# Patient Record
Sex: Female | Born: 1945 | Race: White | Hispanic: No | Marital: Married | Smoking: Former smoker
Health system: Southern US, Community
[De-identification: ages and names within clinical notes are randomized; demographics above are authoritative.]

## PROBLEM LIST (undated history)

## (undated) DIAGNOSIS — M48061 Spinal stenosis, lumbar region without neurogenic claudication: Secondary | ICD-10-CM

## (undated) DIAGNOSIS — F32A Depression, unspecified: Secondary | ICD-10-CM

## (undated) DIAGNOSIS — E079 Disorder of thyroid, unspecified: Secondary | ICD-10-CM

## (undated) DIAGNOSIS — K5790 Diverticulosis of intestine, part unspecified, without perforation or abscess without bleeding: Secondary | ICD-10-CM

## (undated) DIAGNOSIS — F3181 Bipolar II disorder: Secondary | ICD-10-CM

## (undated) DIAGNOSIS — F329 Major depressive disorder, single episode, unspecified: Secondary | ICD-10-CM

## (undated) DIAGNOSIS — I10 Essential (primary) hypertension: Secondary | ICD-10-CM

## (undated) DIAGNOSIS — M797 Fibromyalgia: Secondary | ICD-10-CM

## (undated) DIAGNOSIS — M4316 Spondylolisthesis, lumbar region: Secondary | ICD-10-CM

## (undated) DIAGNOSIS — M545 Low back pain, unspecified: Secondary | ICD-10-CM

## (undated) DIAGNOSIS — J449 Chronic obstructive pulmonary disease, unspecified: Secondary | ICD-10-CM

## (undated) DIAGNOSIS — E785 Hyperlipidemia, unspecified: Secondary | ICD-10-CM

## (undated) DIAGNOSIS — F419 Anxiety disorder, unspecified: Secondary | ICD-10-CM

## (undated) DIAGNOSIS — M1990 Unspecified osteoarthritis, unspecified site: Secondary | ICD-10-CM

## (undated) DIAGNOSIS — I5189 Other ill-defined heart diseases: Secondary | ICD-10-CM

## (undated) HISTORY — DX: Major depressive disorder, single episode, unspecified: F32.9

## (undated) HISTORY — DX: Disorder of thyroid, unspecified: E07.9

## (undated) HISTORY — PX: TONSILLECTOMY: SUR1361

## (undated) HISTORY — PX: EYE SURGERY: SHX253

## (undated) HISTORY — PX: OTHER SURGICAL HISTORY: SHX169

## (undated) HISTORY — PX: APPENDECTOMY: SHX54

## (undated) HISTORY — DX: Hyperlipidemia, unspecified: E78.5

## (undated) HISTORY — DX: Other ill-defined heart diseases: I51.89

## (undated) HISTORY — DX: Essential (primary) hypertension: I10

## (undated) HISTORY — DX: Spondylolisthesis, lumbar region: M43.16

## (undated) HISTORY — DX: Low back pain, unspecified: M54.50

## (undated) HISTORY — PX: TOTAL ABDOMINAL HYSTERECTOMY: SHX209

## (undated) HISTORY — DX: Bipolar II disorder: F31.81

## (undated) HISTORY — DX: Spinal stenosis, lumbar region without neurogenic claudication: M48.061

## (undated) HISTORY — DX: Depression, unspecified: F32.A

## (undated) HISTORY — DX: Low back pain: M54.5

## (undated) HISTORY — DX: Unspecified osteoarthritis, unspecified site: M19.90

## (undated) HISTORY — DX: Fibromyalgia: M79.7

## (undated) HISTORY — DX: Anxiety disorder, unspecified: F41.9

## (undated) HISTORY — DX: Chronic obstructive pulmonary disease, unspecified: J44.9

## (undated) HISTORY — DX: Diverticulosis of intestine, part unspecified, without perforation or abscess without bleeding: K57.90

---

## 1999-12-26 ENCOUNTER — Encounter: Payer: Self-pay | Admitting: Family Medicine

## 1999-12-26 ENCOUNTER — Encounter: Admission: RE | Admit: 1999-12-26 | Discharge: 1999-12-26 | Payer: Self-pay | Admitting: Family Medicine

## 2000-03-13 ENCOUNTER — Encounter: Admission: RE | Admit: 2000-03-13 | Discharge: 2000-03-13 | Payer: Self-pay | Admitting: Thoracic Surgery

## 2000-03-13 ENCOUNTER — Encounter: Payer: Self-pay | Admitting: Thoracic Surgery

## 2000-03-18 ENCOUNTER — Encounter: Admission: RE | Admit: 2000-03-18 | Discharge: 2000-03-18 | Payer: Self-pay | Admitting: Thoracic Surgery

## 2000-03-18 ENCOUNTER — Encounter: Payer: Self-pay | Admitting: Thoracic Surgery

## 2001-08-08 ENCOUNTER — Encounter: Payer: Self-pay | Admitting: Family Medicine

## 2001-08-08 ENCOUNTER — Encounter: Admission: RE | Admit: 2001-08-08 | Discharge: 2001-08-08 | Payer: Self-pay | Admitting: Family Medicine

## 2001-08-12 ENCOUNTER — Encounter: Admission: RE | Admit: 2001-08-12 | Discharge: 2001-08-12 | Payer: Self-pay

## 2003-03-27 ENCOUNTER — Encounter: Payer: Self-pay | Admitting: Family Medicine

## 2003-03-27 ENCOUNTER — Ambulatory Visit (HOSPITAL_COMMUNITY): Admission: RE | Admit: 2003-03-27 | Discharge: 2003-03-27 | Payer: Self-pay | Admitting: Family Medicine

## 2003-11-04 ENCOUNTER — Encounter: Admission: RE | Admit: 2003-11-04 | Discharge: 2003-11-04 | Payer: Self-pay | Admitting: Family Medicine

## 2004-08-24 ENCOUNTER — Emergency Department (HOSPITAL_COMMUNITY): Admission: EM | Admit: 2004-08-24 | Discharge: 2004-08-24 | Payer: Self-pay | Admitting: Emergency Medicine

## 2006-02-19 ENCOUNTER — Encounter: Admission: RE | Admit: 2006-02-19 | Discharge: 2006-02-19 | Payer: Self-pay | Admitting: Family Medicine

## 2006-07-31 ENCOUNTER — Ambulatory Visit: Payer: Self-pay | Admitting: Family Medicine

## 2006-08-28 ENCOUNTER — Ambulatory Visit: Payer: Self-pay | Admitting: Family Medicine

## 2006-11-12 HISTORY — PX: KNEE ARTHROSCOPY: SUR90

## 2007-01-27 ENCOUNTER — Ambulatory Visit: Payer: Self-pay | Admitting: Internal Medicine

## 2007-01-28 ENCOUNTER — Encounter: Payer: Self-pay | Admitting: Internal Medicine

## 2007-01-28 LAB — CONVERTED CEMR LAB
Bilirubin Urine: NEGATIVE
Ketones, ur: NEGATIVE mg/dL
Leukocytes, UA: NEGATIVE
Protein, ur: 30 mg/dL — AB
Specific Gravity, Urine: 1.027 (ref 1.005–1.03)
Urine Glucose: NEGATIVE mg/dL
pH: 8 (ref 5.0–8.0)

## 2007-01-29 ENCOUNTER — Encounter: Payer: Self-pay | Admitting: Internal Medicine

## 2007-02-21 ENCOUNTER — Encounter: Admission: RE | Admit: 2007-02-21 | Discharge: 2007-02-21 | Payer: Self-pay | Admitting: Family Medicine

## 2007-02-21 ENCOUNTER — Ambulatory Visit: Payer: Self-pay | Admitting: Family Medicine

## 2007-04-23 DIAGNOSIS — I10 Essential (primary) hypertension: Secondary | ICD-10-CM

## 2007-04-23 DIAGNOSIS — N719 Inflammatory disease of uterus, unspecified: Secondary | ICD-10-CM | POA: Insufficient documentation

## 2007-04-23 HISTORY — DX: Essential (primary) hypertension: I10

## 2007-04-23 HISTORY — DX: Inflammatory disease of uterus, unspecified: N71.9

## 2007-06-19 ENCOUNTER — Telehealth (INDEPENDENT_AMBULATORY_CARE_PROVIDER_SITE_OTHER): Payer: Self-pay | Admitting: *Deleted

## 2007-06-20 ENCOUNTER — Ambulatory Visit: Payer: Self-pay | Admitting: Family Medicine

## 2007-06-20 DIAGNOSIS — G8929 Other chronic pain: Secondary | ICD-10-CM

## 2007-06-20 DIAGNOSIS — M542 Cervicalgia: Secondary | ICD-10-CM | POA: Insufficient documentation

## 2007-06-20 DIAGNOSIS — M545 Low back pain, unspecified: Secondary | ICD-10-CM | POA: Insufficient documentation

## 2007-06-20 HISTORY — DX: Low back pain, unspecified: M54.50

## 2007-06-20 HISTORY — DX: Other chronic pain: G89.29

## 2007-06-20 HISTORY — DX: Cervicalgia: M54.2

## 2007-06-23 ENCOUNTER — Telehealth (INDEPENDENT_AMBULATORY_CARE_PROVIDER_SITE_OTHER): Payer: Self-pay | Admitting: *Deleted

## 2007-06-24 ENCOUNTER — Ambulatory Visit: Payer: Self-pay | Admitting: Family Medicine

## 2007-06-24 LAB — CONVERTED CEMR LAB
Calcium: 9.6 mg/dL (ref 8.4–10.5)
GFR calc Af Amer: 54 mL/min
Sodium: 143 meq/L (ref 135–145)

## 2007-06-25 ENCOUNTER — Ambulatory Visit: Payer: Self-pay | Admitting: Internal Medicine

## 2007-06-26 ENCOUNTER — Encounter (INDEPENDENT_AMBULATORY_CARE_PROVIDER_SITE_OTHER): Payer: Self-pay | Admitting: *Deleted

## 2007-07-01 ENCOUNTER — Telehealth (INDEPENDENT_AMBULATORY_CARE_PROVIDER_SITE_OTHER): Payer: Self-pay | Admitting: Family Medicine

## 2007-08-22 ENCOUNTER — Ambulatory Visit: Payer: Self-pay | Admitting: Family Medicine

## 2007-08-22 ENCOUNTER — Telehealth (INDEPENDENT_AMBULATORY_CARE_PROVIDER_SITE_OTHER): Payer: Self-pay | Admitting: *Deleted

## 2007-08-24 LAB — CONVERTED CEMR LAB
BUN: 21 mg/dL (ref 6–23)
CO2: 29 meq/L (ref 19–32)
Calcium: 9.3 mg/dL (ref 8.4–10.5)
GFR calc Af Amer: 109 mL/min
Potassium: 3.4 meq/L — ABNORMAL LOW (ref 3.5–5.1)
Sodium: 139 meq/L (ref 135–145)

## 2007-08-25 ENCOUNTER — Telehealth (INDEPENDENT_AMBULATORY_CARE_PROVIDER_SITE_OTHER): Payer: Self-pay | Admitting: *Deleted

## 2007-08-25 ENCOUNTER — Ambulatory Visit: Payer: Self-pay | Admitting: Family Medicine

## 2007-10-13 ENCOUNTER — Telehealth (INDEPENDENT_AMBULATORY_CARE_PROVIDER_SITE_OTHER): Payer: Self-pay | Admitting: *Deleted

## 2007-12-04 ENCOUNTER — Telehealth (INDEPENDENT_AMBULATORY_CARE_PROVIDER_SITE_OTHER): Payer: Self-pay | Admitting: *Deleted

## 2007-12-05 ENCOUNTER — Ambulatory Visit: Payer: Self-pay | Admitting: Family Medicine

## 2007-12-05 LAB — CONVERTED CEMR LAB
CO2: 31 meq/L (ref 19–32)
Calcium: 9.6 mg/dL (ref 8.4–10.5)
Chloride: 102 meq/L (ref 96–112)
Sodium: 141 meq/L (ref 135–145)

## 2007-12-08 ENCOUNTER — Encounter (INDEPENDENT_AMBULATORY_CARE_PROVIDER_SITE_OTHER): Payer: Self-pay | Admitting: *Deleted

## 2007-12-19 ENCOUNTER — Ambulatory Visit: Payer: Self-pay | Admitting: Family Medicine

## 2008-04-12 ENCOUNTER — Ambulatory Visit: Payer: Self-pay | Admitting: Internal Medicine

## 2008-04-12 LAB — CONVERTED CEMR LAB
Blood in Urine, dipstick: NEGATIVE
Glucose, Urine, Semiquant: NEGATIVE
Ketones, urine, test strip: NEGATIVE
Nitrite: NEGATIVE
Protein, U semiquant: NEGATIVE
Urobilinogen, UA: NEGATIVE
pH: 5

## 2008-08-24 ENCOUNTER — Ambulatory Visit: Payer: Self-pay | Admitting: Family Medicine

## 2008-10-08 ENCOUNTER — Encounter (INDEPENDENT_AMBULATORY_CARE_PROVIDER_SITE_OTHER): Payer: Self-pay | Admitting: *Deleted

## 2008-11-24 ENCOUNTER — Ambulatory Visit: Payer: Self-pay | Admitting: Family Medicine

## 2008-11-24 DIAGNOSIS — L989 Disorder of the skin and subcutaneous tissue, unspecified: Secondary | ICD-10-CM | POA: Insufficient documentation

## 2008-11-25 ENCOUNTER — Encounter (INDEPENDENT_AMBULATORY_CARE_PROVIDER_SITE_OTHER): Payer: Self-pay | Admitting: *Deleted

## 2008-11-25 LAB — CONVERTED CEMR LAB
AST: 27 units/L (ref 0–37)
Albumin: 4.2 g/dL (ref 3.5–5.2)
Alkaline Phosphatase: 57 units/L (ref 39–117)
Basophils Absolute: 0 10*3/uL (ref 0.0–0.1)
Basophils Relative: 0.3 % (ref 0.0–3.0)
Bilirubin, Direct: 0.1 mg/dL (ref 0.0–0.3)
CO2: 30 meq/L (ref 19–32)
Chloride: 101 meq/L (ref 96–112)
Creatinine, Ser: 0.8 mg/dL (ref 0.4–1.2)
HCT: 40.6 % (ref 36.0–46.0)
MCHC: 33.8 g/dL (ref 30.0–36.0)
MCV: 92.4 fL (ref 78.0–100.0)
Monocytes Absolute: 0.6 10*3/uL (ref 0.1–1.0)
Neutro Abs: 4.2 10*3/uL (ref 1.4–7.7)
Platelets: 332 10*3/uL (ref 150–400)
TSH: 1.63 microintl units/mL (ref 0.35–5.50)
Total Protein: 7.5 g/dL (ref 6.0–8.3)
Triglycerides: 82 mg/dL (ref 0–149)

## 2008-12-03 ENCOUNTER — Telehealth (INDEPENDENT_AMBULATORY_CARE_PROVIDER_SITE_OTHER): Payer: Self-pay | Admitting: *Deleted

## 2008-12-14 ENCOUNTER — Ambulatory Visit: Payer: Self-pay | Admitting: Family Medicine

## 2008-12-14 ENCOUNTER — Telehealth (INDEPENDENT_AMBULATORY_CARE_PROVIDER_SITE_OTHER): Payer: Self-pay | Admitting: *Deleted

## 2008-12-14 DIAGNOSIS — M533 Sacrococcygeal disorders, not elsewhere classified: Secondary | ICD-10-CM | POA: Insufficient documentation

## 2009-05-25 ENCOUNTER — Ambulatory Visit: Payer: Self-pay | Admitting: Family Medicine

## 2009-07-14 ENCOUNTER — Telehealth (INDEPENDENT_AMBULATORY_CARE_PROVIDER_SITE_OTHER): Payer: Self-pay | Admitting: *Deleted

## 2009-08-18 ENCOUNTER — Encounter (INDEPENDENT_AMBULATORY_CARE_PROVIDER_SITE_OTHER): Payer: Self-pay | Admitting: *Deleted

## 2009-09-15 ENCOUNTER — Ambulatory Visit: Payer: Self-pay | Admitting: Family Medicine

## 2009-09-15 DIAGNOSIS — L02219 Cutaneous abscess of trunk, unspecified: Secondary | ICD-10-CM | POA: Insufficient documentation

## 2009-09-15 DIAGNOSIS — L03319 Cellulitis of trunk, unspecified: Secondary | ICD-10-CM

## 2009-09-15 HISTORY — DX: Cutaneous abscess of trunk, unspecified: L02.219

## 2010-01-17 ENCOUNTER — Encounter: Payer: Self-pay | Admitting: Family Medicine

## 2010-01-17 ENCOUNTER — Emergency Department (HOSPITAL_BASED_OUTPATIENT_CLINIC_OR_DEPARTMENT_OTHER): Admission: EM | Admit: 2010-01-17 | Discharge: 2010-01-17 | Payer: Self-pay | Admitting: Emergency Medicine

## 2010-01-17 ENCOUNTER — Ambulatory Visit: Payer: Self-pay | Admitting: Radiology

## 2010-01-20 ENCOUNTER — Telehealth: Payer: Self-pay | Admitting: Family Medicine

## 2010-01-24 ENCOUNTER — Ambulatory Visit: Payer: Self-pay | Admitting: Family Medicine

## 2010-01-24 DIAGNOSIS — M5416 Radiculopathy, lumbar region: Secondary | ICD-10-CM

## 2010-01-24 HISTORY — DX: Radiculopathy, lumbar region: M54.16

## 2010-01-27 ENCOUNTER — Telehealth: Payer: Self-pay | Admitting: Family Medicine

## 2010-01-30 ENCOUNTER — Encounter: Admission: RE | Admit: 2010-01-30 | Discharge: 2010-01-30 | Payer: Self-pay | Admitting: Family Medicine

## 2010-02-02 ENCOUNTER — Telehealth (INDEPENDENT_AMBULATORY_CARE_PROVIDER_SITE_OTHER): Payer: Self-pay | Admitting: *Deleted

## 2010-02-14 ENCOUNTER — Telehealth (INDEPENDENT_AMBULATORY_CARE_PROVIDER_SITE_OTHER): Payer: Self-pay | Admitting: *Deleted

## 2010-02-20 ENCOUNTER — Encounter: Payer: Self-pay | Admitting: Family Medicine

## 2010-02-22 ENCOUNTER — Ambulatory Visit: Payer: Self-pay | Admitting: Family Medicine

## 2010-02-22 ENCOUNTER — Telehealth (INDEPENDENT_AMBULATORY_CARE_PROVIDER_SITE_OTHER): Payer: Self-pay | Admitting: *Deleted

## 2010-02-22 DIAGNOSIS — G609 Hereditary and idiopathic neuropathy, unspecified: Secondary | ICD-10-CM

## 2010-02-22 HISTORY — DX: Hereditary and idiopathic neuropathy, unspecified: G60.9

## 2010-03-14 ENCOUNTER — Encounter: Payer: Self-pay | Admitting: Family Medicine

## 2010-04-18 ENCOUNTER — Telehealth (INDEPENDENT_AMBULATORY_CARE_PROVIDER_SITE_OTHER): Payer: Self-pay | Admitting: *Deleted

## 2010-04-24 ENCOUNTER — Telehealth (INDEPENDENT_AMBULATORY_CARE_PROVIDER_SITE_OTHER): Payer: Self-pay | Admitting: *Deleted

## 2010-07-20 ENCOUNTER — Ambulatory Visit: Payer: Self-pay | Admitting: Family Medicine

## 2010-07-24 ENCOUNTER — Ambulatory Visit: Payer: Self-pay | Admitting: Family Medicine

## 2010-08-09 ENCOUNTER — Encounter: Payer: Self-pay | Admitting: Family Medicine

## 2010-08-10 ENCOUNTER — Encounter: Payer: Self-pay | Admitting: Family Medicine

## 2010-08-25 ENCOUNTER — Telehealth (INDEPENDENT_AMBULATORY_CARE_PROVIDER_SITE_OTHER): Payer: Self-pay | Admitting: *Deleted

## 2010-08-25 ENCOUNTER — Ambulatory Visit: Payer: Self-pay | Admitting: Family Medicine

## 2010-09-05 ENCOUNTER — Ambulatory Visit: Payer: Self-pay | Admitting: Family Medicine

## 2010-09-05 DIAGNOSIS — H6690 Otitis media, unspecified, unspecified ear: Secondary | ICD-10-CM

## 2010-09-05 HISTORY — DX: Otitis media, unspecified, unspecified ear: H66.90

## 2010-09-19 ENCOUNTER — Ambulatory Visit: Payer: Self-pay | Admitting: Family Medicine

## 2010-09-19 DIAGNOSIS — J069 Acute upper respiratory infection, unspecified: Secondary | ICD-10-CM | POA: Insufficient documentation

## 2010-09-19 HISTORY — DX: Acute upper respiratory infection, unspecified: J06.9

## 2010-11-27 ENCOUNTER — Telehealth (INDEPENDENT_AMBULATORY_CARE_PROVIDER_SITE_OTHER): Payer: Self-pay | Admitting: *Deleted

## 2010-12-03 ENCOUNTER — Encounter: Payer: Self-pay | Admitting: Orthopedic Surgery

## 2010-12-03 ENCOUNTER — Encounter: Payer: Self-pay | Admitting: Family Medicine

## 2010-12-12 NOTE — Progress Notes (Signed)
Summary: nausea  Phone Note Call from Patient   Caller: Patient Summary of Call: Pt states that she was unable to have MRi done because it was to small but has reschedule it. pt now c/o nausea, and vomiting on yesterday. pt now only c/o nausea today and would like to have some rx for this.pt denies any fever, or vomiting. pt is able to eat and drink. pt uses [facility]..................Marland KitchenFelecia Deloach CMA  January 27, 2010 10:38 AM   Follow-up for Phone Call        phenergan 50 mg  1 by mouth qid as needed  #20   Follow-up by: Loreen Freud DO,  January 27, 2010 11:07 AM  Additional Follow-up for Phone Call Additional follow up Details #1::        pt states that she thinks that she is claustrophobic and would like to have a med rx so that she can have MRI done without having to reschedule it again. pls advise on med..........Marland KitchenFelecia Deloach CMA  January 27, 2010 11:41 AM     Additional Follow-up for Phone Call Additional follow up Details #2::    xanax 0.25 mg   #5  take 1 before procedure----she should bring the rest with her and radiology will tell her to take more if needed Follow-up by: Loreen Freud DO,  January 27, 2010 12:51 PM  Additional Follow-up for Phone Call Additional follow up Details #3:: Details for Additional Follow-up Action Taken: pt aware rx sent to pharmacy..................Marland KitchenFelecia Deloach CMA  January 27, 2010 2:14 PM   New/Updated Medications: PROMETHAZINE HCL 50 MG TABS (PROMETHAZINE HCL) Take 1 by mouth qid as needed XANAX 0.25 MG TABS (ALPRAZOLAM) Take 1 tab before procedure Prescriptions: XANAX 0.25 MG TABS (ALPRAZOLAM) Take 1 tab before procedure  #5 x 0   Entered by:   Jeremy Johann CMA   Authorized by:   Loreen Freud DO   Signed by:   Jeremy Johann CMA on 01/27/2010   Method used:   Printed then faxed to ...       [facility] #[id number]* (retail)       [address]       [place],  [place]  [address]       Ph: [phone]       Fax: [phone]   RxID:   [id number] PROMETHAZINE HCL 50 MG TABS (PROMETHAZINE HCL) Take 1 by mouth qid as needed  #20 x 0   Entered by:   Jeremy Johann CMA   Authorized by:   Loreen Freud DO   Signed by:   Loreen Freud DO on 01/27/2010   Method used:   Faxed to ...       [facility] #[id number]* (retail)       [address]       [place], [place]  [address]       Ph: [phone]       Fax: [phone]   RxID:   [id number]

## 2010-12-12 NOTE — Letter (Signed)
Summary: Generic Letter  [facility]  [address] [place], [place] [address]   Phone: [phone]  Fax: [phone]    07/24/2010  [employer]ING [address] [place], [place]  [address]  To Whom It May Concern,  Ms Below will not be able to do this diet as it is adversely effecting her health.  I have seen her twice in the last week for GI intolerance and labile blood pressures while attempting to complete the detox portion of the diet.  I have advised her to stop this program immediately as she is unable to tolerate the supplements and should absolutely not do the detox due to the above mentioned blood pressure issues.  Please excuse her from this program and take this into account when determining her refund.  Thank you for your understanding in this matter.    Sincerely,   Neena Rhymes MD

## 2010-12-12 NOTE — Assessment & Plan Note (Signed)
Summary: ED f/u hip pain//fd   Vital Signs:  Patient profile:   65 year old female Height:      62 inches Weight:      182 pounds Temp:     98.1 degrees F oral Pulse rate:   88 / minute Pulse rhythm:   regular BP sitting:   124 / 80  (left arm) Cuff size:   regular  Vitals Entered By: Army Fossa CMA (January 24, 2010 3:50 PM) CC: Pt here c/o pain in her right hip that goes down to her foot. , Back Pain   History of Present Illness:       This is a 65 year old woman who presents with Back Pain.  The symptoms began 2 weeks ago.  Pt here f/u ED for back pain.  The patient denies fever, chills, weakness, loss of sensation, fecal incontinence, urinary incontinence, urinary retention, dysuria, rest pain, inability to work, and inability to care for self.  The pain is located in the right low back.  The pain began suddenly.  The pain radiates to the right hip, right buttock, and right leg below the knee.  The pain is made worse by standing or walking.  The pain is made better by inactivity and acetaminophen.    ER records reviewed and xrays  Allergies: 1)  ! Sulfa  Physical Exam  General:  Well-developed,well-nourished,in no acute distress; alert,appropriate and cooperative throughout examination Extremities:  No clubbing, cyanosis, edema, or deformity noted with normal full range of motion of all joints.   Neurologic:  DTR decreased on R patellar weakness r plantar and dorsi flexion R foot and toe slightly weaker ext Low  R ext Psych:  Oriented X3 and normally interactive.     Impression & Recommendations:  Problem # 1:  BACK PAIN, LUMBAR, WITH RADICULOPATHY (ICD-724.4)  Her updated medication list for this problem includes:    Ultram 50 Mg Tabs (Tramadol hcl) .Marland Kitchen... Take 1-2 by mouth every 6 hours as needed    Flexeril 10 Mg Tabs (Cyclobenzaprine hcl) .Marland Kitchen... 1 by mouth three times a day as needed    Oxycodone-acetaminophen 5-325 Mg Tabs (Oxycodone-acetaminophen) .Marland Kitchen... 1-2  by  mouth every 4-6 hours as needed  Orders: Radiology Referral (Radiology)  Discussed use of moist heat or ice, modified activities, medications, and stretching/strengthening exercises. Back care instructions given. To be seen in 2 weeks if no improvement; sooner if worsening of symptoms.   Complete Medication List: 1)  Ventolin Hfa 108 (90 Base) Mcg/act Aers (Albuterol sulfate) .... 2 puffs q4 hours as needed 2)  Lisinopril-hydrochlorothiazide 10-12.5 Mg Tabs (Lisinopril-hydrochlorothiazide) .Marland Kitchen.. 1 tab by mouth daily 3)  Donut Cushion  .... As needed 4)  Ultram 50 Mg Tabs (Tramadol hcl) .... Take 1-2 by mouth every 6 hours as needed 5)  Lithium Carbonate 300 Mg Caps (Lithium carbonate) .... 1/2 by mouth qam and 1 by mouth qpm 6)  Flexeril 10 Mg Tabs (Cyclobenzaprine hcl) .Marland Kitchen.. 1 by mouth three times a day as needed 7)  Oxycodone-acetaminophen 5-325 Mg Tabs (Oxycodone-acetaminophen) .Marland Kitchen.. 1-2  by mouth every 4-6 hours as needed Prescriptions: FLEXERIL 10 MG TABS (CYCLOBENZAPRINE HCL) 1 by mouth three times a day as needed  #30 x 1   Entered and Authorized by:   Loreen Freud DO   Signed by:   Loreen Freud DO on 01/24/2010   Method used:   Electronically to        [facility] #[id number]* (retail)  [address]       [place], [place]  [address]       Ph: [phone]       Fax: [phone]   RxID:   [id number]

## 2010-12-12 NOTE — Progress Notes (Signed)
Summary: lisinopril/hctz  Phone Note Refill Request Call back at [phone] Message from:  Pharmacy on April 24, 2010 10:57 AM  Refills Requested: Medication #1:  LISINOPRIL-HYDROCHLOROTHIAZIDE 10-12.5 MG TABS 1 tab by mouth daily   Dosage confirmed as above?Dosage Confirmed   Supply Requested: 1 month [facility] The Patient is requesting authorization to dispensea 90 day supply.  Next Appointment Scheduled: none Initial call taken by: Harold Barban,  April 24, 2010 10:59 AM    Prescriptions: LISINOPRIL-HYDROCHLOROTHIAZIDE 10-12.5 MG TABS (LISINOPRIL-HYDROCHLOROTHIAZIDE) 1 tab by mouth daily  #90 x 0   Entered by:   Doristine Devoid   Authorized by:   Neena Rhymes MD   Signed by:   Doristine Devoid on 04/24/2010   Method used:   Electronically to        [facility] #[id number]* (retail)       [address]       [place], [place]  [address]       Ph: [phone]       Fax: [phone]   RxID:   [id number]

## 2010-12-12 NOTE — Progress Notes (Signed)
Summary: lisinopril/hctz refill   Phone Note Refill Request Call back at [phone] Message from:  Pharmacy on April 18, 2010 2:34 PM  Refills Requested: Medication #1:  LISINOPRIL-HYDROCHLOROTHIAZIDE 10-12.5 MG TABS 1 tab by mouth daily   Dosage confirmed as above?Dosage Confirmed   Supply Requested: 1 month   Last Refilled: 03/19/2010 [facility]  Next Appointment Scheduled: none Initial call taken by: Harold Barban,  April 18, 2010 2:36 PM    Prescriptions: LISINOPRIL-HYDROCHLOROTHIAZIDE 10-12.5 MG TABS (LISINOPRIL-HYDROCHLOROTHIAZIDE) 1 tab by mouth daily  #30 x 0   Entered by:   Doristine Devoid   Authorized by:   Neena Rhymes MD   Signed by:   Doristine Devoid on 04/19/2010   Method used:   Electronically to        [facility] #[id number]* (retail)       [address]       [place], [place]  [address]       Ph: [phone]       Fax: [phone]   RxID:   [id number]

## 2010-12-12 NOTE — Assessment & Plan Note (Signed)
Summary: bp check/stomache upset - ready to cry/cbs   Vital Signs:  Patient profile:   65 year old female Height:      62 inches Weight:      186 pounds Temp:     97.8 degrees F oral Pulse rate:   92 / minute BP sitting:   140 / 90  (left arm)  Vitals Entered By: Jeremy Johann CMA (July 24, 2010 1:48 PM) CC: bp check, still no better   History of Present Illness: 65 yo woman here today for BP check.  BP mildly elevated today.  pt had stopped Lisinopril last week due to hypotension.  denies HA, visual changes, CP, SOB, edema.  abd discomfort- 'my stomach still isn't settled'.  no nausea or vomiting.  'i don't think this diet is working for me'.  now eating solid food.  depression- psych (Dr Haywood Lasso) changed the Li yesterday to 1 tab two times a day.  last night 'lost my temper w/ my husband, not a pretty site'.  was shaking mad.  psych thinks pt is bipolar II.  Current Medications (verified): 1)  Ventolin Hfa 108 (90 Base) Mcg/act Aers (Albuterol Sulfate) .... 2 Puffs Q4 Hours As Needed 2)  Lisinopril-Hydrochlorothiazide 10-12.5 Mg Tabs (Lisinopril-Hydrochlorothiazide) .Marland Kitchen.. 1 Tab By Mouth Daily 3)  Lithium Carbonate 450 Mg Cr-Tabs (Lithium Carbonate) .... Take One Tablet in Am and One Tablet in Pm 4)  Clonazepam .... Unsure of Dose Can Take Up To 2 Tablets Daily As Needed  Allergies (verified): 1)  ! Sulfa  Past History:  Past Medical History: Anxiety Hypertension LBP ? bipolar II- Dr Haywood Lasso  Review of Systems      See HPI  Physical Exam  General:  obviously upset- trying not to cry Neck:  No deformities, masses, or tenderness noted. Lungs:  Normal respiratory effort, chest expands symmetrically. Lungs are clear to auscultation, no crackles or wheezes. Heart:  Normal rate and regular rhythm. S1 and S2 normal without gallop, murmur, click, rub or other extra sounds. Abdomen:  soft, NT/ND, +BS.  no rebound/guarding Pulses:  +2 carotid, radial, DP Extremities:  no  C/C/E Psych:  tearful and moderately anxious.     Impression & Recommendations:  Problem # 1:  HYPERTENSION (ICD-401.9) Assessment Unchanged restart Lisinopril.  pt's BP only mildly elevated.  asymptomatic at this time.  will follow. Her updated medication list for this problem includes:    Lisinopril-hydrochlorothiazide 10-12.5 Mg Tabs (Lisinopril-hydrochlorothiazide) .Marland Kitchen... 1 tab by mouth daily  Problem # 2:  NAUSEA (ICD-787.02) Assessment: Improved stomach 'still not settled' but nause improving since adding back solid food.  letter provided to pt to try and get refund from diet program.  pt is not tolerating this and should not continue.  Problem # 3:  ANXIETY (ICD-300.00) Assessment: Unchanged Dr Haywood Lasso (psych) feels pt is bipolar II.  he adjusted her Li dose yesterday and pt did not tolerate this well.  will reduce dose back to previous and call psych.  Complete Medication List: 1)  Ventolin Hfa 108 (90 Base) Mcg/act Aers (Albuterol sulfate) .... 2 puffs q4 hours as needed 2)  Lisinopril-hydrochlorothiazide 10-12.5 Mg Tabs (Lisinopril-hydrochlorothiazide) .Marland Kitchen.. 1 tab by mouth daily 3)  Lithium Carbonate 450 Mg Cr-tabs (Lithium carbonate) .... Take one tablet in am and one tablet in pm 4)  Clonazepam  .... Unsure of dose can take up to 2 tablets daily as needed  Patient Instructions: 1)  Follow up in 1 week to recheck stomach 2)  STOP your  diet 3)  Call Dr Haywood Lasso to discuss your lithium dose- return to your previous dose until you can get an appt with him 4)  Restart your lisinopril 5)  Call with any questions or concerns 6)  Hang in there!

## 2010-12-12 NOTE — Assessment & Plan Note (Signed)
Summary: bronchitis/kn   Vital Signs:  Patient profile:   65 year old female Weight:      191 pounds O2 Sat:      96 % on Room air Temp:     98.1 degrees F oral Pulse rate:   109 / minute BP sitting:   116 / 72  (left arm)  Vitals Entered By: Doristine Devoid CMA (September 05, 2010 10:58 AM)  O2 Flow:  Room air CC: bronchitis? cough and sinus congestion ears hurts x4 days    History of Present Illness: 65 yo woman here today for URI.  sxs started 4 days ago.  + sick contacts.  no fevers.  sinus congestion but no pain.  cough- intermittantly productive.  cough is painful.  bilateral ear pain.  + laryngitis.  Current Medications (verified): 1)  Ventolin Hfa 108 (90 Base) Mcg/act Aers (Albuterol Sulfate) .... 2 Puffs Q4 Hours As Needed 2)  Lisinopril-Hydrochlorothiazide 10-12.5 Mg Tabs (Lisinopril-Hydrochlorothiazide) .... Take 1/2  Tab By Mouth Daily 3)  Clonazepam .... Unsure of Dose Can Take Up To 2 Tablets Daily As Needed 4)  Depakote .... Dose Unknown- Take Three Times A Day  Allergies (verified): 1)  ! Sulfa  Review of Systems      See HPI  Physical Exam  General:  Well-developed,well-nourished,in no acute distress; alert,appropriate and cooperative throughout examination Head:  NCAT, no TTP over sinuses Eyes:  no injxn or inflammation Ears:  TMs erythematous, dull, poor landmarks bilaterally Nose:  + congestion Mouth:  + PND Lungs:  Normal respiratory effort, chest expands symmetrically. Lungs are clear to auscultation, no crackles or wheezes. + dry cough Heart:  Normal rate and regular rhythm. S1 and S2 normal without gallop, murmur, click, rub or other extra sounds.   Impression & Recommendations:  Problem # 1:  OTITIS MEDIA (ICD-382.9) Assessment New bilateral ear infxns.  start Amox. Her updated medication list for this problem includes:    Amoxicillin 500 Mg Tabs (Amoxicillin) .Marland Kitchen... 1 tab by mouth two times a day x10 days  Problem # 2:  BRONCHITIS- ACUTE  (ICD-466.0) Assessment: New amox will treat this also.  start tessalon for cough.  mucinex and coricidin HBP as needed.  reviewed supportive care and red flags that should prompt return.  Pt expresses understanding and is in agreement w/ this plan. Her updated medication list for this problem includes:    Ventolin Hfa 108 (90 Base) Mcg/act Aers (Albuterol sulfate) .Marland Kitchen... 2 puffs q4 hours as needed    Amoxicillin 500 Mg Tabs (Amoxicillin) .Marland Kitchen... 1 tab by mouth two times a day x10 days    Benzonatate 200 Mg Caps (Benzonatate) .Marland Kitchen... 1 tab by mouth three times a day as needed for cough  Complete Medication List: 1)  Ventolin Hfa 108 (90 Base) Mcg/act Aers (Albuterol sulfate) .... 2 puffs q4 hours as needed 2)  Lisinopril-hydrochlorothiazide 10-12.5 Mg Tabs (Lisinopril-hydrochlorothiazide) .... Take 1/2  tab by mouth daily 3)  Clonazepam  .... Unsure of dose can take up to 2 tablets daily as needed 4)  Depakote  .... Dose unknown- take three times a day 5)  Amoxicillin 500 Mg Tabs (Amoxicillin) .Marland Kitchen.. 1 tab by mouth two times a day x10 days 6)  Benzonatate 200 Mg Caps (Benzonatate) .Marland Kitchen.. 1 tab by mouth three times a day as needed for cough  Patient Instructions: 1)  You have bilateral ear infections and bronchitis 2)  Take the Amoxicillin as directed- take w/ food to avoid upset stomach 3)  Use the Benzonatate for cough 4)  Add Mucinex to thin your congestion 5)  Coricidin HBP for congestion- this is safe w/ blood pressure 6)  Drink plenty of fluids 7)  Ibuprofen as needed for pain or fever 8)  Hang in there!!! Prescriptions: BENZONATATE 200 MG CAPS (BENZONATATE) 1 tab by mouth three times a day as needed for cough  #60 x 0   Entered and Authorized by:   Neena Rhymes MD   Signed by:   Neena Rhymes MD on 09/05/2010   Method used:   Electronically to        [facility] [phone]* (retail)       [address]       [place], [place]  [address]        Ph: [phone]       Fax: [phone]   RxID:   [id number] AMOXICILLIN 500 MG TABS (AMOXICILLIN) 1 tab by mouth two times a day x10 days  #20 x 0   Entered and Authorized by:   Neena Rhymes MD   Signed by:   Neena Rhymes MD on 09/05/2010   Method used:   Electronically to        [facility] #[id number]* (retail)       [address]       [place], [place]  [address]       Ph: [phone]       Fax: [phone]   RxID:   [id number]    Orders Added: 1)  Est. Patient Level III [[address]]

## 2010-12-12 NOTE — Assessment & Plan Note (Signed)
Summary: BP CHECK/KN  Nurse Visit   Vital Signs:  Patient profile:   65 year old female Height:      62 inches Weight:      190 pounds Temp:     97.9 degrees F oral Pulse rate:   86 / minute BP sitting:   100 / 68  (left arm)  Vitals Entered By: Jeremy Johann CMA (August 25, 2010 2:12 PM) CC: bp check   Current Medications (verified): 1)  Ventolin Hfa 108 (90 Base) Mcg/act Aers (Albuterol Sulfate) .... 2 Puffs Q4 Hours As Needed 2)  Lisinopril-Hydrochlorothiazide 10-12.5 Mg Tabs (Lisinopril-Hydrochlorothiazide) .Marland Kitchen.. 1 Tab By Mouth Daily 3)  Clonazepam .... Unsure of Dose Can Take Up To 2 Tablets Daily As Needed  Allergies (verified): 1)  ! Sulfa  Orders Added: 1)  Est. Patient Level I [[address]]

## 2010-12-12 NOTE — Miscellaneous (Signed)
Summary: Flu vacc documentation  Clinical Lists Changes  Observations: Added new observation of FLU VAX: Historical  (08/09/2010 11:56)        Immunization History:  Influenza Immunization History:    Influenza:  historical  (08/09/2010)     Flu vacc. was given yesterday at [facility] on [address], [place]. Lucious Groves CMA  August 10, 2010 11:57 AM

## 2010-12-12 NOTE — Progress Notes (Signed)
Summary: Refill Request  Phone Note Refill Request Call back at [phone] Message from:  Pharmacy on August 25, 2010 10:24 AM  Refills Requested: Medication #1:  LISINOPRIL-HYDROCHLOROTHIAZIDE 10-12.5 MG TABS 1 tab by mouth daily   Dosage confirmed as above?Dosage Confirmed   Supply Requested: 1 month   Last Refilled: 07/27/2010 [facility] [address]  Next Appointment Scheduled: none Initial call taken by: Harold Barban,  August 25, 2010 10:24 AM    Prescriptions: LISINOPRIL-HYDROCHLOROTHIAZIDE 10-12.5 MG TABS (LISINOPRIL-HYDROCHLOROTHIAZIDE) 1 tab by mouth daily  #90 x 0   Entered by:   Jeremy Johann CMA   Authorized by:   Neena Rhymes MD   Signed by:   Jeremy Johann CMA on 08/25/2010   Method used:   Faxed to ...       [facility] #[id number]* (retail)       [address]       [place], [place]  [address]       Ph: [phone]       Fax: [phone]   RxID:   [id number]

## 2010-12-12 NOTE — Progress Notes (Signed)
Summary: a1c results  Phone Note Call from Patient Call back at Home Phone [phone]   Summary of Call: Patient is requesting that you call her with A1c results.  She is anxious to know if she is diabetic Megan Duncan  February 22, 2010 1:03 PM   Follow-up for Phone Call        patient aware of results.Marland KitchenMarland KitchenDoristine Devoid  February 22, 2010 4:40 PM

## 2010-12-12 NOTE — Progress Notes (Signed)
Summary: lisinorpril/hctz refill   Phone Note Refill Request Message from:  Pharmacy on [facility] [address] Fax #: [phone]  Refills Requested: Medication #1:  LISINOPRIL-HYDROCHLOROTHIAZIDE 10-12.5 MG TABS 1 tab by mouth daily   Dosage confirmed as above?Dosage Confirmed   Supply Requested: 1 month   Last Refilled: 01/07/2010 Next Appointment Scheduled: none Initial call taken by: Harold Barban,  February 14, 2010 1:26 PM    Prescriptions: LISINOPRIL-HYDROCHLOROTHIAZIDE 10-12.5 MG TABS (LISINOPRIL-HYDROCHLOROTHIAZIDE) 1 tab by mouth daily  #30 x 1   Entered by:   Doristine Devoid   Authorized by:   Neena Rhymes MD   Signed by:   Doristine Devoid on 02/14/2010   Method used:   Electronically to        [facility] #[id number]* (retail)       [address]       [place], [place]  [address]       Ph: [phone]       Fax: [phone]   RxID:   [id number]

## 2010-12-12 NOTE — Assessment & Plan Note (Signed)
Summary: bp up /cbs   Vital Signs:  Patient profile:   65 year old female Height:      62 inches Weight:      186 pounds BMI:     34.14 Pulse rate:   96 / minute BP sitting:   100 / 62  (left arm)  Vitals Entered By: Doristine Devoid CMA (July 20, 2010 1:16 PM) CC: bp low has been on detox have mainly been on liquids    History of Present Illness: 65 yo woman here today for low BP.  is on detox diet- has lost 3 lbs in a week.  detox diet is 12 weeks.  had nausea yesterday.  BP was 88/63.  had sweet potato and zucchini last night.  stopped the herbal supplements today.  2 32 oz bottles of lemon juice, maple syrup, water is all pt is supposed to have for 4 days.  nausea improved but not resolved.  denies abd pain, dizziness, visual changes, CP, SOB.  no vomiting, diarrhea, shaking.  no trouble w/ changing positions.  Current Medications (verified): 1)  Ventolin Hfa 108 (90 Base) Mcg/act Aers (Albuterol Sulfate) .... 2 Puffs Q4 Hours As Needed 2)  Lisinopril-Hydrochlorothiazide 10-12.5 Mg Tabs (Lisinopril-Hydrochlorothiazide) .Marland Kitchen.. 1 Tab By Mouth Daily 3)  Donut Cushion .... As Needed 4)  Lithium Carbonate 450 Mg Cr-Tabs (Lithium Carbonate) .... Take One Tablet in Am and One Tablet in Pm 5)  Flexeril 10 Mg Tabs (Cyclobenzaprine Hcl) .Marland Kitchen.. 1 By Mouth Three Times A Day As Needed 6)  Clonazepam .... Unsure of Dose Can Take Up To 2 Tablets Daily As Needed  Allergies (verified): 1)  ! Sulfa  Past History:  Past Medical History: Last updated: 02/22/2010 Anxiety Hypertension LBP  Review of Systems      See HPI  Physical Exam  General:  Well-developed,well-nourished,in no acute distress; alert,appropriate and cooperative throughout examination Head:  NCAT Eyes:  PERRL, EOMI Lungs:  Normal respiratory effort, chest expands symmetrically. Lungs are clear to auscultation, no crackles or wheezes. Heart:  Normal rate and regular rhythm. S1 and S2 normal without gallop, murmur, click,  rub or other extra sounds. Abdomen:  soft, NT/ND, +BS Pulses:  +2 carotid, radial, DP Extremities:  no C/C/E   Impression & Recommendations:  Problem # 1:  HYPERTENSION (ICD-401.9) Assessment Unchanged pt now having hypotensive episodes.  likely due to her liquid only diet.  advised her to stop her meds.  will follow closely. Her updated medication list for this problem includes:    Lisinopril-hydrochlorothiazide 10-12.5 Mg Tabs (Lisinopril-hydrochlorothiazide) .Marland Kitchen... 1 tab by mouth daily  Problem # 2:  NAUSEA (ICD-787.02) Assessment: New pt's nausea may be related to hypotension or low blood sugar.  discussed that the liquid only detox part of diet is not necessary and may be dangerous.  should follow the diet portion but skip the detox sections.  will add OTC PPI to help w/ pt's acid production since she is eating less.  may be contributing to nausea.  will follow.  Problem # 3:  OBESITY (ICD-278.00) Assessment: New pt now following strict detox diet.  advised her to follow the healthy eating portion but skip the liquid detox part (see above).  Complete Medication List: 1)  Ventolin Hfa 108 (90 Base) Mcg/act Aers (Albuterol sulfate) .... 2 puffs q4 hours as needed 2)  Lisinopril-hydrochlorothiazide 10-12.5 Mg Tabs (Lisinopril-hydrochlorothiazide) .Marland Kitchen.. 1 tab by mouth daily 3)  Donut Cushion  .... As needed 4)  Lithium Carbonate 450 Mg Cr-tabs (  Lithium carbonate) .... Take one tablet in am and one tablet in pm 5)  Flexeril 10 Mg Tabs (Cyclobenzaprine hcl) .Marland Kitchen.. 1 by mouth three times a day as needed 6)  Clonazepam  .... Unsure of dose can take up to 2 tablets daily as needed  Patient Instructions: 1)  Schedule a nurse visit to recheck your blood pressure next week 2)  STOP the lisinopril 3)  Add Omeprazole (Prilosec) to help with the stomach acid production 4)  Drink plenty of fluids 5)  If the herbal supplements continue to make you sick you may not be able to do it  6)  I don't  like the Detox- healthy eating, yes!  Detox- no! 7)  Call with any questions or concerns! 8)  Hang in there!!

## 2010-12-12 NOTE — Progress Notes (Signed)
Summary: hip pain  Phone Note Call from Patient Call back at Home Phone [phone]   Caller: Patient Summary of Call: pt states that she was seen in [facility] on 01-17-10 for severe hip pain that made it difficulty to walk. pt was rx predisone which did help some but the pain is still there. pt would like to know if she need to come in to see you or is there something that you can suggest she do for this. copy of ED report on [facility] and x-rays attached to phone note pls advise..........Marland KitchenFelecia Deloach CMA  January 20, 2010 11:04 AM   Follow-up for Phone Call        DG Lumbar Spine Complete - STATUS: Final  IMAGE                                     Perform Date: 8 Mar11 13:18  Ordered By: Peyton Najjar,          Ordered Date: 8 Mar11 13:03  Facility: [facility]                               Department: DG  Service Report Text  [facility] Accession Number: [id number]      Clinical Data: Back pain.    LUMBAR SPINE - COMPLETE 4+ VIEW    Comparison: None.    Findings: Small vertebral body osteophytes.  The minimal disc space   narrowing and osteophytic formation at L2-3.  No pars defects or   spondylolisthesis.  Degenerative changes lower lumbar facet joints.   3 mm calcification on the right at L4 of undetermined etiology.   This could represent a ureteral calculus in the proper clinical   setting.    IMPRESSION:   Mild spondylosis.  Calcification on the right at L4 level.    Read By:  Jonne Ply,  M.D.   Released By:  Jonne Ply,  M.D.  Additional Information  HL7 RESULT STATUS : F  External image : [id number]  External IF Update Timestamp : 2010-01-17:13:32:45.000000   Additional Follow-up for Phone Call Additional follow up Details #1::        they only gave her prednisone and flexeril?  We can call in ultram 50 mg 1-2 by mouth every 6 hours as needed #30--- but she needs ov for [name] to evaluate to see what else can be done ER notes reviewed. Additional  Follow-up by: Loreen Freud DO,  January 20, 2010 11:17 AM    Additional Follow-up for Phone Call Additional follow up Details #2::    pt aware, rx sent to pharmacy, appt scheduled............Marland KitchenFelecia Deloach CMA  January 20, 2010 12:12 PM   New/Updated Medications: ULTRAM 50 MG TABS (TRAMADOL HCL) Take 1-2 by mouth every 6 hours as needed Prescriptions: ULTRAM 50 MG TABS (TRAMADOL HCL) Take 1-2 by mouth every 6 hours as needed  #30 x 0   Entered by:   Jeremy Johann CMA   Authorized by:   Loreen Freud DO   Signed by:   Jeremy Johann CMA on 01/20/2010   Method used:   Faxed to ...       [facility] [address] #[id number]* (retail)       [address]       [place]  [place], [place]  [address]       Ph: [phone]       Fax: [phone]   RxID:   [id number]

## 2010-12-12 NOTE — Progress Notes (Signed)
Summary: xray results  Phone Note Outgoing Call   Call placed by: Doristine Devoid,  February 02, 2010 9:40 AM Call placed to: Patient Summary of Call: + disc bulge with foraminal narrowing---- refer to neurosurgery---Dr Venetia Maxon   Follow-up for Phone Call        spoke w/ patient aware of referral and results.....Marland KitchenMarland KitchenDoristine Devoid  February 02, 2010 9:40 AM

## 2010-12-12 NOTE — Assessment & Plan Note (Signed)
Summary: BRONCHITIS NO BETTER/RH.Marland Kitchen   Vital Signs:  Patient profile:   65 year old female Weight:      189 pounds O2 Sat:      95 % on Room air Temp:     98.4 degrees F oral Pulse rate:   96 / minute BP sitting:   114 / 76  (left arm)  Vitals Entered By: Doristine Devoid CMA (September 19, 2010 9:57 AM)  O2 Flow:  Room air CC: still w/ cough and chest congestion    History of Present Illness: 65 yo woman here today w/ continued cough and chest congestion.  no fever.  + body aches.  completed 10 days of amox.  + cough- dry but 'it feels stuck'.  + nasal congestion but less than previous.  feels better w/ coricidin but 'as soon as it wears off i want to curl up in a ball'.  + fatigue.  Current Medications (verified): 1)  Ventolin Hfa 108 (90 Base) Mcg/act Aers (Albuterol Sulfate) .... 2 Puffs Q4 Hours As Needed 2)  Lisinopril-Hydrochlorothiazide 10-12.5 Mg Tabs (Lisinopril-Hydrochlorothiazide) .... Take 1/2  Tab By Mouth Daily 3)  Clonazepam .... Unsure of Dose Can Take Up To 2 Tablets Daily As Needed 4)  Depakote .... Dose Unknown- Take Three Times A Day 5)  Benzonatate 200 Mg Caps (Benzonatate) .Marland Kitchen.. 1 Tab By Mouth Three Times A Day As Needed For Cough  Allergies (verified): 1)  ! Sulfa  Review of Systems      See HPI  Physical Exam  General:  Well-developed,well-nourished,in no acute distress; alert,appropriate and cooperative throughout examination Head:  NCAT, no TTP over sinuses Eyes:  no injxn or inflammation Ears:  External ear exam shows no significant lesions or deformities.  Otoscopic examination reveals clear canals, tympanic membranes are intact bilaterally without bulging, retraction, inflammation or discharge. Hearing is grossly normal bilaterally. Nose:  + congestion Mouth:  + PND Neck:  No deformities, masses, or tenderness noted. Lungs:  Normal respiratory effort, chest expands symmetrically. Lungs are clear to auscultation, no crackles or wheezes. no cough heard  during visit Heart:  Normal rate and regular rhythm. S1 and S2 normal without gallop, murmur, click, rub or other extra sounds.   Impression & Recommendations:  Problem # 1:  URI (ICD-465.9) Assessment New pt's bilateral OM now resolved.  recently completed course of amox.  sxs now appear to be viral.  reviewed supportive care and red flags that should prompt return.  Pt expresses understanding and is in agreement w/ this plan. Her updated medication list for this problem includes:    Benzonatate 200 Mg Caps (Benzonatate) .Marland Kitchen... 1 tab by mouth three times a day as needed for cough  Complete Medication List: 1)  Ventolin Hfa 108 (90 Base) Mcg/act Aers (Albuterol sulfate) .... 2 puffs q4 hours as needed 2)  Lisinopril-hydrochlorothiazide 10-12.5 Mg Tabs (Lisinopril-hydrochlorothiazide) .... Take 1/2  tab by mouth daily 3)  Clonazepam  .... Unsure of dose can take up to 2 tablets daily as needed 4)  Depakote  .... Dose unknown- take three times a day 5)  Benzonatate 200 Mg Caps (Benzonatate) .Marland Kitchen.. 1 tab by mouth three times a day as needed for cough  Patient Instructions: 1)  At this point this appears to be viral and should improve w/ time 2)  Your ear infections have improved- the Amox worked! 3)  Continue the benzonatate as needed for cough 4)  Drink plenty of fluids 5)  Continue the Coricidin for your  congestion 6)  REST as you are able! 7)  Hang in there!!!   Orders Added: 1)  Est. Patient Level III [66440]

## 2010-12-12 NOTE — Assessment & Plan Note (Signed)
Summary: pt states Dr. Venetia Maxon is requesting tested for a dibetic checku...   Vital Signs:  Patient profile:   65 year old female Weight:      183 pounds BP sitting:   104 / 70  (left arm)  Vitals Entered By: Doristine Devoid (February 22, 2010 11:07 AM) CC: back pain better but still concerned also needs labs for diabetes at request of Dr. Venetia Maxon   History of Present Illness: 65 yo woman here today for back pain.  referred to Dr Venetia Maxon by Dr Laury Axon.  saw Venetia Maxon Monday- MRI shows slight bulge but nothing requiring surgery.  discovered weakness on entire R side.  has upcoming nerve conduction study.  Dr Venetia Maxon wants her tested for diabetes to r/o cause of neuropathy.  pt reports numbness and weakness are improving since initial onset.  back pain improving.  denies polydipsia, polyuria, polyphagia.  Preventive Screening-Counseling & Management  Caffeine-Diet-Exercise     Does Patient Exercise: no  Current Medications (verified): 1)  Ventolin Hfa 108 (90 Base) Mcg/act Aers (Albuterol Sulfate) .... 2 Puffs Q4 Hours As Needed 2)  Lisinopril-Hydrochlorothiazide 10-12.5 Mg Tabs (Lisinopril-Hydrochlorothiazide) .Marland Kitchen.. 1 Tab By Mouth Daily 3)  Donut Cushion .... As Needed 4)  Lithium Carbonate 300 Mg Caps (Lithium Carbonate) .... 1/2 By Mouth Qam and 1 By Mouth Qpm 5)  Flexeril 10 Mg Tabs (Cyclobenzaprine Hcl) .Marland Kitchen.. 1 By Mouth Three Times A Day As Needed 6)  Xanax 0.25 Mg Tabs (Alprazolam) .... Take 1 Tab Before Procedure  Allergies (verified): 1)  ! Sulfa  Past History:  Past Medical History: Anxiety Hypertension LBP  Review of Systems      See HPI  Physical Exam  General:  Well-developed,well-nourished,in no acute distress; alert,appropriate and cooperative throughout examination Pulses:  +2 DP/PT Neurologic:  sensation intact to light touch, gait normal, and DTRs symmetrical and normal.   Psych:  anxious   Impression & Recommendations:  Problem # 1:  PERIPHERAL NEUROPATHY  (ICD-356.9) Assessment New will check A1C to r/o DM as cause of peripheral neuropathy.  even if A1C indicates diabetes given pt's last 3 years of fasting CBGs (106, 76, 75) would find it hard to believe that this would be sufficient glycosylation to damage nerves.  reassured pt that her continued improvement is a positive sign and that we will await the results of the nerve conduction test.  pt in agreement. Orders: Venipuncture (16109) TLB-A1C / Hgb A1C (Glycohemoglobin) (83036-A1C)  Complete Medication List: 1)  Ventolin Hfa 108 (90 Base) Mcg/act Aers (Albuterol sulfate) .... 2 puffs q4 hours as needed 2)  Lisinopril-hydrochlorothiazide 10-12.5 Mg Tabs (Lisinopril-hydrochlorothiazide) .Marland Kitchen.. 1 tab by mouth daily 3)  Donut Cushion  .... As needed 4)  Lithium Carbonate 300 Mg Caps (Lithium carbonate) .... 1/2 by mouth qam and 1 by mouth qpm 5)  Flexeril 10 Mg Tabs (Cyclobenzaprine hcl) .Marland Kitchen.. 1 by mouth three times a day as needed 6)  Xanax 0.25 Mg Tabs (Alprazolam) .... Take 1 tab before procedure  Patient Instructions: 1)  Follow up with me after we have the results of the nerve conduction test 2)  Continue to take it easy 3)  Use your muscle relaxers and anti-inflammatories as needed. 4)  We'll notify you of your lab results 5)  Hang in there!!

## 2010-12-14 NOTE — Progress Notes (Signed)
Summary: Refill Request  Phone Note Refill Request Call back at [phone] Message from:  Pharmacy on November 27, 2010 8:25 AM  Refills Requested: Medication #1:  LISINOPRIL-HYDROCHLOROTHIAZIDE 10-12.5 MG TABS take 1/2  tab by mouth daily   Dosage confirmed as above?Dosage Confirmed   Supply Requested: 3 months   Last Refilled: 08/25/2010 [facility] [address]   Next Appointment Scheduled: none Initial call taken by: Harold Barban,  November 27, 2010 8:25 AM    Prescriptions: LISINOPRIL-HYDROCHLOROTHIAZIDE 10-12.5 MG TABS (LISINOPRIL-HYDROCHLOROTHIAZIDE) take 1/2  tab by mouth daily  #90 x 1   Entered by:   Doristine Devoid CMA   Authorized by:   Neena Rhymes MD   Signed by:   Doristine Devoid CMA on 11/27/2010   Method used:   Electronically to        [facility] [address] [id number]* (retail)       [address]       [place], [place]  [address]       Ph: [phone]       Fax: [phone]   RxID:   [id number]

## 2010-12-15 NOTE — Miscellaneous (Signed)
Summary: [facility]  [facility]   Imported By: Lanelle Bal 08/18/2010 14:21:12  _____________________________________________________________________  External Attachment:    Type:   Image     Comment:   External Document

## 2010-12-15 NOTE — Letter (Signed)
Summary: [facility]  [facility]   Imported By: Lanelle Bal 04/19/2010 10:59:58  _____________________________________________________________________  External Attachment:    Type:   Image     Comment:   External Document

## 2010-12-15 NOTE — Consult Note (Signed)
Summary: [facility]  [facility]   Imported By: Lanelle Bal 03/07/2010 13:10:40  _____________________________________________________________________  External Attachment:    Type:   Image     Comment:   External Document

## 2011-03-26 ENCOUNTER — Encounter: Payer: Self-pay | Admitting: Family Medicine

## 2011-04-11 ENCOUNTER — Ambulatory Visit (INDEPENDENT_AMBULATORY_CARE_PROVIDER_SITE_OTHER): Payer: BC Managed Care – PPO | Admitting: Family Medicine

## 2011-04-11 VITALS — BP 122/80 | Temp 99.2°F | Wt 186.0 lb

## 2011-04-11 DIAGNOSIS — L0291 Cutaneous abscess, unspecified: Secondary | ICD-10-CM

## 2011-04-11 DIAGNOSIS — L0390 Cellulitis, unspecified: Secondary | ICD-10-CM

## 2011-04-11 MED ORDER — CLORAZEPATE DIPOTASSIUM 3.75 MG PO TABS: 3.7500 mg | ORAL_TABLET | Freq: Two times a day (BID) | ORAL | Status: AC | PRN

## 2011-04-11 MED ORDER — LISINOPRIL-HYDROCHLOROTHIAZIDE 10-12.5 MG PO TABS: 0.5000 | ORAL_TABLET | Freq: Every day | ORAL | Status: DC

## 2011-04-11 MED ORDER — DOXYCYCLINE HYCLATE 100 MG PO TABS: 100.0000 mg | ORAL_TABLET | Freq: Two times a day (BID) | ORAL | Status: DC

## 2011-04-11 NOTE — Progress Notes (Signed)
  Subjective:    Patient ID: Megan Duncan, female    DOB: 14-Jun-1946, 65 y.o.   MRN: [id number]  HPI Boils- reports 'series of eruptions' every 3-6 months.  Occur in groin on R side and on breasts bilaterally.  Hx of previous staph infxn.  Groin 'erupted' over the weekend.  Did warm epsom salt soaks over the weekend.  Also has active area on L breast.   Review of Systems For ROS see HPI     Objective:   Physical Exam  Constitutional: She appears well-developed and well-nourished. No distress.  Lymphadenopathy:       Left axillary: No pectoral and no lateral adenopathy present.      Right: Inguinal adenopathy present.       Left: No inguinal adenopathy present.  Skin: Skin is warm and dry.       ~1 cm open central area surrounded by erythema and mild induration.  Mildly TTP.  ~2 cm erythematous, non-fluctuant area on L breast inferiorly          Assessment & Plan:

## 2011-04-11 NOTE — Patient Instructions (Signed)
These boils will improve w/ the Doxycycline- this will treat all bacteria (including MRSA) Wash w/ antibacterial soap/body wash Call with any questions or concerns Hang in there!!

## 2011-04-23 NOTE — Assessment & Plan Note (Signed)
Area in groin and on breast do not require drainage.  Start Doxy to cover for possible MRSA.  Reviewed importance of anti-bacterial bodywash.  Reviewed supportive care and red flags that should prompt return.  Pt expressed understanding and is in agreement w/ plan.

## 2011-09-04 ENCOUNTER — Telehealth: Payer: Self-pay | Admitting: Family Medicine

## 2011-09-04 NOTE — Telephone Encounter (Signed)
Pt has no documentation of hearing loss. Pt last seen on 04-11-11 would you prefer to see Pt or ok to refer.Please advise

## 2011-09-04 NOTE — Telephone Encounter (Signed)
Ok to refer for hearing loss 

## 2011-09-04 NOTE — Telephone Encounter (Signed)
Patient would like to be referred to audiologist - she finds that she is turning tv up & family members are telling her it is too loud - she refused appt but would welcome a call from dr Beverely Low to discuss

## 2011-09-04 NOTE — Telephone Encounter (Signed)
Discuss with patient. Pt indicated that she would like to get a call from dr tabori to discuss this matter. Pt notes that she feels more comfortable speaking with her physician on the phone.

## 2011-09-05 NOTE — Telephone Encounter (Signed)
Please call pt and ask what she would like to discuss.  I would be happy to refer her for a hearing evaluation if she thinks it is necessary.  We often refer based on pt's wishes when it comes to hearing b/c it is such a subjective matter.

## 2011-11-21 ENCOUNTER — Emergency Department (INDEPENDENT_AMBULATORY_CARE_PROVIDER_SITE_OTHER): Payer: Medicare Other

## 2011-11-21 ENCOUNTER — Encounter (HOSPITAL_BASED_OUTPATIENT_CLINIC_OR_DEPARTMENT_OTHER): Payer: Self-pay | Admitting: *Deleted

## 2011-11-21 ENCOUNTER — Emergency Department (HOSPITAL_BASED_OUTPATIENT_CLINIC_OR_DEPARTMENT_OTHER)
Admission: EM | Admit: 2011-11-21 | Discharge: 2011-11-21 | Disposition: A | Discharge status: Home / Self Care | Payer: Medicare Other | Attending: Emergency Medicine | Admitting: Emergency Medicine

## 2011-11-21 DIAGNOSIS — F341 Dysthymic disorder: Secondary | ICD-10-CM | POA: Insufficient documentation

## 2011-11-21 DIAGNOSIS — M549 Dorsalgia, unspecified: Secondary | ICD-10-CM | POA: Diagnosis not present

## 2011-11-21 DIAGNOSIS — M79609 Pain in unspecified limb: Secondary | ICD-10-CM

## 2011-11-21 DIAGNOSIS — IMO0002 Reserved for concepts with insufficient information to code with codable children: Secondary | ICD-10-CM | POA: Diagnosis not present

## 2011-11-21 DIAGNOSIS — F319 Bipolar disorder, unspecified: Secondary | ICD-10-CM | POA: Diagnosis not present

## 2011-11-21 DIAGNOSIS — M25559 Pain in unspecified hip: Secondary | ICD-10-CM

## 2011-11-21 DIAGNOSIS — M62838 Other muscle spasm: Secondary | ICD-10-CM | POA: Diagnosis not present

## 2011-11-21 DIAGNOSIS — M5137 Other intervertebral disc degeneration, lumbosacral region: Secondary | ICD-10-CM | POA: Diagnosis not present

## 2011-11-21 DIAGNOSIS — M545 Low back pain: Secondary | ICD-10-CM | POA: Diagnosis not present

## 2011-11-21 MED ORDER — CYCLOBENZAPRINE HCL 10 MG PO TABS: 10.0000 mg | ORAL_TABLET | Freq: Two times a day (BID) | ORAL | Status: AC | PRN

## 2011-11-21 MED ORDER — DIAZEPAM 5 MG/ML IJ SOLN
2.5000 mg | Freq: Once | INTRAMUSCULAR | Status: AC
  Administered 2011-11-21: 2.5 mg via INTRAVENOUS
  Filled 2011-11-21: qty 2

## 2011-11-21 MED ORDER — ONDANSETRON HCL 4 MG/2ML IJ SOLN
4.0000 mg | Freq: Once | INTRAMUSCULAR | Status: AC
  Administered 2011-11-21: 4 mg via INTRAVENOUS

## 2011-11-21 MED ORDER — DIAZEPAM 5 MG/ML IJ SOLN
5.0000 mg | Freq: Once | INTRAMUSCULAR | Status: AC
  Administered 2011-11-21: 5 mg via INTRAVENOUS
  Filled 2011-11-21: qty 2

## 2011-11-21 MED ORDER — MORPHINE SULFATE 4 MG/ML IJ SOLN
4.0000 mg | Freq: Once | INTRAMUSCULAR | Status: AC
  Administered 2011-11-21: 4 mg via INTRAVENOUS
  Filled 2011-11-21: qty 1

## 2011-11-21 MED ORDER — OXYCODONE-ACETAMINOPHEN 5-325 MG PO TABS: 2.0000 | ORAL_TABLET | ORAL | Status: DC | PRN

## 2011-11-21 MED ORDER — ONDANSETRON HCL 4 MG/2ML IJ SOLN
INTRAMUSCULAR | Status: AC
  Filled 2011-11-21: qty 2

## 2011-11-21 MED ORDER — HYDROMORPHONE HCL PF 1 MG/ML IJ SOLN
1.0000 mg | Freq: Once | INTRAMUSCULAR | Status: AC
  Administered 2011-11-21: 1 mg via INTRAVENOUS
  Filled 2011-11-21: qty 1

## 2011-11-21 MED ORDER — IBUPROFEN 600 MG PO TABS: 600.0000 mg | ORAL_TABLET | Freq: Four times a day (QID) | ORAL | Status: DC | PRN

## 2011-11-21 NOTE — ED Notes (Signed)
Patient reports sudden onset of l;ower back pain that started late yesterday and has grown worse and is shooting down R leg, took flexeril & ibuprofen no relief

## 2011-11-21 NOTE — ED Notes (Signed)
Patients respiration rate was charted as 78 and respiration rate was 18

## 2011-11-21 NOTE — ED Notes (Signed)
Pt states she is having Nausea.  MD notified.

## 2011-11-21 NOTE — ED Provider Notes (Addendum)
History     CSN: [id number]  Arrival date & time 11/21/11  1512   First MD Initiated Contact with Patient 11/21/11 1537      Chief Complaint  Patient presents with  . Back Pain    (Consider location/radiation/quality/duration/timing/severity/associated sxs/prior treatment) HPI  [name] h/o LBP, depression, anxiety, bipolar d/o pw back pain. She complaining of mid lower back pain since yesterday. She states that the pain radiates down her right leg. It's been progressively worse since yesterday. She states she can no longer walk and she felt that her back is spasming. She complains of numbness to the right lateral lower leg. Denies tingling or weakness in her extremities. She states she feels she can't walk secondary to pain. No bowel or bladder incontinence. Denies fevers, chills. No recent illness. She denies falls, trauma. She states that the last time this happened a year ago she was lying in bed for approximately 30 days before she felt better. She had unremarkable MRI done at that time.  Denies h/o malignancy, DM, immunocompromise  injection drug use, immunosuppression, indwelling urinary catheter, prolonged steroid use, skin or urinary tract infection.   ED Notes, ED Provider Notes from 11/21/11 0000 to 11/21/11 15:46:51       Griffin Basil, RN 11/21/2011 15:34      Patient reports sudden onset of l;ower back pain that started late yesterday and has grown worse and is shooting down R leg, took flexeril & ibuprofen no relief     Past Medical History  Diagnosis Date  . Anxiety   . Depression   . LBP (low back pain)   . Bipolar II disorder     Dr. Haywood Lasso    Past Surgical History  Procedure Date  . Appendectomy   . Total abdominal hysterectomy   . Tonsillectomy     Family History  Problem Relation Age of Onset  . Diabetes Father   . Hypertension Father   . Hypertension Mother   . Mental illness Sister     suicide    History  Substance Use Topics  . Smoking status:  Former Games developer  . Smokeless tobacco: Not on file  . Alcohol Use:     OB History    Grav Para Term Preterm Abortions TAB SAB Ect Mult Living                  Review of Systems  All other systems reviewed and are negative.   except as noted HPI   Allergies  Codeine and Sulfonamide derivatives  Home Medications   Current Outpatient Rx  Name Route Sig Dispense Refill  . ALBUTEROL SULFATE HFA 108 (90 BASE) MCG/ACT IN AERS Inhalation Inhale 2 puffs into the lungs every 4 (four) hours as needed. For shortness of breath and wheezing    . CLORAZEPATE DIPOTASSIUM PO Oral Take 1 tablet by mouth daily as needed. For anxiety    . CYCLOBENZAPRINE HCL 10 MG PO TABS Oral Take 10 mg by mouth 3 (three) times daily as needed. For muscle spasms    . IBUPROFEN 200 MG PO TABS Oral Take 200 mg by mouth every 6 (six) hours as needed. For pain    . LISINOPRIL-HYDROCHLOROTHIAZIDE 10-12.5 MG PO TABS Oral Take 0.5 tablets by mouth daily. 90 tablet 3  . CYCLOBENZAPRINE HCL 10 MG PO TABS Oral Take 1 tablet (10 mg total) by mouth 2 (two) times daily as needed for muscle spasms. 20 tablet 0  . IBUPROFEN 600 MG PO TABS  Oral Take 1 tablet (600 mg total) by mouth every 6 (six) hours as needed for pain. 30 tablet 0  . OXYCODONE-ACETAMINOPHEN 5-325 MG PO TABS Oral Take 2 tablets by mouth every 4 (four) hours as needed for pain. 20 tablet 0    BP 90/72  Pulse 72  Temp(Src) 97.9 F (36.6 C) (Oral)  Resp 16  SpO2 96%  Physical Exam  Nursing note and vitals reviewed. Constitutional: She is oriented to person, place, and time. She appears well-developed.       Crying out in pain  HENT:  Head: Atraumatic.  Mouth/Throat: Oropharynx is clear and moist.  Eyes: Conjunctivae and EOM are normal. Pupils are equal, round, and reactive to light.  Neck: Normal range of motion. Neck supple.  Cardiovascular: Normal rate, regular rhythm, normal heart sounds and intact distal pulses.   Pulmonary/Chest: Effort normal and  breath sounds normal. No respiratory distress. She has no wheezes. She has no rales.  Abdominal: Soft. She exhibits no distension. There is no tenderness. There is no rebound and no guarding.  Musculoskeletal: Normal range of motion.       Midline lower lumbar /R SI ttp. Pt screaming out in pain during exam--including with any movement of her b/l LE Unable to assess for +straight leg raise. She does have worsening pain with int/ext rotation of her Rt hip  RLE gross sensation intact throughout. Cap refill <3 sec. No saddle anesthesia  Neurological: She is alert and oriented to person, place, and time.  Skin: Skin is warm and dry. No rash noted.  Psychiatric: She has a normal mood and affect.    ED Course  Procedures (including critical care time)  Labs Reviewed - No data to display No results found.   1. Back pain   2. Muscle spasm   3. Degenerative disc disease       MDM  Rt back pain radiating to leg. Likely sciatica v/s muscle spasm, herniated disc. Difficult exam at this time 2/2 patient in severe pain. Morphine and valium ordered. Reassess.  1630 Patient continues to have severe pain. XR with progression of L2/L3 disc degeneration. Valium. Reassess.  Feeling better prior to discharge. Negative straight leg raise. Ambulatory. Home with pain medication, muscle relaxant. F/U sports med/ortho      Forbes Cellar, MD 11/21/11 3086  Forbes Cellar, MD 11/27/11 0020

## 2011-11-21 NOTE — ED Notes (Signed)
Patient ambulated in hallway, drinking ginger ale & eating crackers

## 2011-11-23 ENCOUNTER — Telehealth: Payer: Self-pay | Admitting: Family Medicine

## 2011-11-23 NOTE — Telephone Encounter (Signed)
Pls advise.  

## 2011-11-23 NOTE — Telephone Encounter (Signed)
Should take the Percocet as directed- if no improvement in pain while taking ibuprofen, flexeril, and percocet for 2 weeks or if sxs are worsening she will need ortho referral

## 2011-11-23 NOTE — Telephone Encounter (Signed)
Pt is aware.  Pt states she began taking the percocet today.

## 2011-11-23 NOTE — Telephone Encounter (Signed)
Patient states that she was seen in ED yesterday with sever back pain and shoots of pain running down leg. The doctor at ED gave her prescriptions for flexeril, ibuprofen 600mg , and percocet. Patient states that she has taken the flexeril and ibuprofen but not the percocet. Patient states that she is still in pain. She stated that she really did not want to come in for a visit.

## 2011-11-26 ENCOUNTER — Telehealth: Payer: Self-pay | Admitting: Family Medicine

## 2011-11-26 DIAGNOSIS — R52 Pain, unspecified: Secondary | ICD-10-CM

## 2011-11-26 MED ORDER — OXYCODONE-ACETAMINOPHEN 5-325 MG PO TABS: 2.0000 | ORAL_TABLET | ORAL | Status: AC | PRN

## 2011-11-26 NOTE — Telephone Encounter (Signed)
Ok for #20 but if it is truly spasm will need Flexeril 10mg  TID prn.  Also needs ortho appt if not improving.

## 2011-11-26 NOTE — Telephone Encounter (Signed)
See previous note from 11/23/11. Pt states that she is still having severe back spasms. Will make an appt for two weeks but she states that she is almost out of percocet. Patient would like Dr. Beverely Low to write her a prescription for percocet to last her until her appt.

## 2011-11-26 NOTE — Telephone Encounter (Signed)
Last OV 04-11-11 last refill noted on 11-21-11 #20 no refills, pt note in chart for 11-23-11 stating pt will need referral to ortho, please advise pt call today in prior message

## 2011-11-26 NOTE — Telephone Encounter (Signed)
Will proceed w/ ortho referral.  Continue flexeril and percocet until her appt

## 2011-11-26 NOTE — Telephone Encounter (Signed)
Sent referral for ortho

## 2011-11-26 NOTE — Telephone Encounter (Signed)
Spoke with pt to clarify if she has flexeril, pt stated that she has 9 flexeril left and she is out of percocet, advised we need to send pt for ortho appt, pt stated that she had an ortho appt before however after all the tests they just identified that she had a muscular appt with a vague type of dx in her eyes however she stated that she will do whatever we ask per she needs some relief asap, she also noted that her last visit to have an ortho visit she almost suffocated during the MRI if we can send her somewhere that either has a bigger machine or give her something to quote "knock me out" so I can handle it per she noted an imaging on her leg at [facility] on [address] on her leg and it made her think that she needs to not be somewhere that can be suffocating if possible. Advised order for percocet will be placed at front desk for pick up to please allow at least 15 minutes to process the paperwork for the rx, pt understood and also wanted to let MD Tabori know that she also had imaging done before of a panal down her leg. I advised that I will let MD Tabori know.

## 2011-12-05 DIAGNOSIS — M545 Low back pain: Secondary | ICD-10-CM | POA: Diagnosis not present

## 2011-12-13 DIAGNOSIS — M545 Low back pain: Secondary | ICD-10-CM | POA: Diagnosis not present

## 2011-12-18 DIAGNOSIS — M545 Low back pain: Secondary | ICD-10-CM | POA: Diagnosis not present

## 2011-12-20 DIAGNOSIS — M545 Low back pain: Secondary | ICD-10-CM | POA: Diagnosis not present

## 2011-12-24 DIAGNOSIS — M545 Low back pain: Secondary | ICD-10-CM | POA: Diagnosis not present

## 2011-12-26 DIAGNOSIS — M545 Low back pain: Secondary | ICD-10-CM | POA: Diagnosis not present

## 2011-12-27 DIAGNOSIS — M545 Low back pain: Secondary | ICD-10-CM | POA: Diagnosis not present

## 2012-01-02 DIAGNOSIS — M545 Low back pain: Secondary | ICD-10-CM | POA: Diagnosis not present

## 2012-01-09 DIAGNOSIS — M545 Low back pain: Secondary | ICD-10-CM | POA: Diagnosis not present

## 2012-01-10 DIAGNOSIS — M545 Low back pain: Secondary | ICD-10-CM | POA: Diagnosis not present

## 2012-01-14 DIAGNOSIS — M545 Low back pain: Secondary | ICD-10-CM | POA: Diagnosis not present

## 2012-01-21 DIAGNOSIS — M545 Low back pain: Secondary | ICD-10-CM | POA: Diagnosis not present

## 2012-01-23 DIAGNOSIS — M545 Low back pain: Secondary | ICD-10-CM | POA: Diagnosis not present

## 2012-01-29 DIAGNOSIS — M545 Low back pain: Secondary | ICD-10-CM | POA: Diagnosis not present

## 2012-01-30 DIAGNOSIS — M545 Low back pain: Secondary | ICD-10-CM | POA: Diagnosis not present

## 2012-04-02 ENCOUNTER — Other Ambulatory Visit: Payer: Self-pay | Admitting: *Deleted

## 2012-04-02 MED ORDER — ALBUTEROL SULFATE HFA 108 (90 BASE) MCG/ACT IN AERS: 2.0000 | INHALATION_SPRAY | RESPIRATORY_TRACT | Status: DC | PRN

## 2012-04-02 NOTE — Telephone Encounter (Signed)
Pt requesting a refill on her inhaler. Last OV 04-11-11 Ok to fill

## 2012-04-02 NOTE — Telephone Encounter (Signed)
rx sent to pharmacy by e-script  

## 2012-04-02 NOTE — Telephone Encounter (Signed)
Ok for 3 refills 

## 2012-04-04 MED ORDER — ALBUTEROL SULFATE HFA 108 (90 BASE) MCG/ACT IN AERS: 2.0000 | INHALATION_SPRAY | RESPIRATORY_TRACT | Status: DC | PRN

## 2012-04-04 NOTE — Telephone Encounter (Signed)
rx sent to pharmacy by e-script [facility] [address]

## 2012-04-04 NOTE — Telephone Encounter (Signed)
Patient uses [facility] [address], please send to correct pharmacy. I have clarified with patient that she is no longer uses [facility], removed from account Please call patient once sent  Ph# [phone], ok to leave message that script has been sent

## 2012-04-04 NOTE — Telephone Encounter (Signed)
Called [facility] and spoke to [facility] and she advised that she has taken the RX for the inhaler out of the system (backed out), called [facility] on [address] and spoke to gail whom advised she has spoken the [facility] and is now filling the medication for the pt, pt aware via pharmacy rep

## 2012-04-04 NOTE — Telephone Encounter (Signed)
Addended by: Derry Lory A on: 04/04/2012 01:13 PM   Modules accepted: Orders

## 2012-04-04 NOTE — Telephone Encounter (Signed)
Please call [facility] and ask them to back out the script we sent to them in error for this patient so she can pick up at [facility]

## 2012-06-02 ENCOUNTER — Encounter: Payer: Self-pay | Admitting: *Deleted

## 2012-06-02 ENCOUNTER — Encounter: Payer: Self-pay | Admitting: Family Medicine

## 2012-06-02 ENCOUNTER — Ambulatory Visit (INDEPENDENT_AMBULATORY_CARE_PROVIDER_SITE_OTHER): Payer: Medicare Other | Admitting: Family Medicine

## 2012-06-02 ENCOUNTER — Encounter: Payer: Medicare Other | Admitting: Family Medicine

## 2012-06-02 VITALS — BP 120/72 | HR 79 | Temp 98.3°F | Ht 62.0 in | Wt 192.0 lb

## 2012-06-02 DIAGNOSIS — Z780 Asymptomatic menopausal state: Secondary | ICD-10-CM

## 2012-06-02 DIAGNOSIS — Z0000 Encounter for general adult medical examination without abnormal findings: Secondary | ICD-10-CM

## 2012-06-02 DIAGNOSIS — Z1211 Encounter for screening for malignant neoplasm of colon: Secondary | ICD-10-CM

## 2012-06-02 DIAGNOSIS — I10 Essential (primary) hypertension: Secondary | ICD-10-CM | POA: Diagnosis not present

## 2012-06-02 DIAGNOSIS — E669 Obesity, unspecified: Secondary | ICD-10-CM | POA: Diagnosis not present

## 2012-06-02 DIAGNOSIS — Z1231 Encounter for screening mammogram for malignant neoplasm of breast: Secondary | ICD-10-CM

## 2012-06-02 LAB — CBC WITH DIFFERENTIAL/PLATELET
Basophils Absolute: 0 10*3/uL (ref 0.0–0.1)
Eosinophils Relative: 2.1 % (ref 0.0–5.0)
Lymphocytes Relative: 34.4 % (ref 12.0–46.0)
Monocytes Relative: 8.1 % (ref 3.0–12.0)
Neutrophils Relative %: 54.9 % (ref 43.0–77.0)
Platelets: 285 10*3/uL (ref 150.0–400.0)
RDW: 13.3 % (ref 11.5–14.6)
WBC: 6.1 10*3/uL (ref 4.5–10.5)

## 2012-06-02 LAB — HEPATIC FUNCTION PANEL
ALT: 26 U/L (ref 0–35)
AST: 25 U/L (ref 0–37)
Bilirubin, Direct: 0 mg/dL (ref 0.0–0.3)
Total Bilirubin: 0.7 mg/dL (ref 0.3–1.2)

## 2012-06-02 LAB — BASIC METABOLIC PANEL
BUN: 18 mg/dL (ref 6–23)
CO2: 29 mEq/L (ref 19–32)
Calcium: 9.3 mg/dL (ref 8.4–10.5)
GFR: 75.17 mL/min (ref >60.00)
Glucose, Bld: 98 mg/dL (ref 70–99)

## 2012-06-02 LAB — LIPID PANEL
HDL: 66.1 mg/dL (ref >39.00)
Total CHOL/HDL Ratio: 3
Triglycerides: 92 mg/dL (ref 0.0–149.0)

## 2012-06-02 LAB — LDL CHOLESTEROL, DIRECT: Direct LDL: 124 mg/dL

## 2012-06-02 MED ORDER — CLORAZEPATE DIPOTASSIUM 3.75 MG PO TABS: 3.7500 mg | ORAL_TABLET | Freq: Two times a day (BID) | ORAL | Status: AC | PRN

## 2012-06-02 MED ORDER — ZOSTER VACCINE LIVE 19400 UNT/0.65ML ~~LOC~~ SOLR: 0.6500 mL | Freq: Once | SUBCUTANEOUS | Status: AC

## 2012-06-02 MED ORDER — LISINOPRIL-HYDROCHLOROTHIAZIDE 10-12.5 MG PO TABS: 0.5000 | ORAL_TABLET | Freq: Every day | ORAL | Status: DC

## 2012-06-02 NOTE — Patient Instructions (Addendum)
Follow up in 3-6 months to recheck weight loss progress We'll call you with your mammo and bone density We'll call you with your GI appt We'll notify you of your lab results Schedule exercise like you would an appt Call with any questions or concerns Enjoy the rest of your summer!!!

## 2012-06-02 NOTE — Progress Notes (Signed)
  Subjective:    Patient ID: Megan Duncan, female    DOB: 07-26-1946, 66 y.o.   MRN: [id number]  HPI Here today for CPE.  Risk Factors: HTN- chronic problem, on Lisinopril HCT.  Well controlled today.  Denies CP, SOB, HAs, visual changes, edema. Obesity- chronic problem, not exercising due to low energy level.  Joined Silver Sneakers but hasn't started yet.  Started Green Coffee extract. Physical Activity: not exercising Fall Risk:  Low risk, steady on feet Depression: chronic problem, has been following w/ Dr Haywood Lasso for Bipolar II but hasn't been recently.  Doesn't feel that any of the meds that she has taken has changed her behavior Hearing: normal to conversational tones, mildly decreased to whispered voice ADL's: independent Cognitive: normal linear thought process, memory and attention intact Home Safety: safe at home, lives w/ husband Height, Weight, BMI, Visual Acuity: see vitals, vision corrected to 20/20 w/ glasses Counseling: overdue on mammo, dexa.  Has never had colonoscopy- 'i'm resistant to doing that' Labs Ordered: See A&P Care Plan: See A&P    Review of Systems Patient reports no vision/ hearing changes, adenopathy,fever, weight change,  persistant/recurrent hoarseness , swallowing issues, chest pain, palpitations, edema, persistant/recurrent cough, hemoptysis, dyspnea (rest/exertional/paroxysmal nocturnal), gastrointestinal bleeding (melena, rectal bleeding), abdominal pain, significant heartburn, bowel changes, GU symptoms (dysuria, hematuria, incontinence), Gyn symptoms (abnormal  bleeding, pain),  syncope, focal weakness, memory loss, numbness & tingling, skin/hair/nail changes, abnormal bruising or bleeding, anxiety, or depression.     Objective:   Physical Exam General Appearance:    Alert, cooperative, no distress, appears stated age  Head:    Normocephalic, without obvious abnormality, atraumatic  Eyes:    PERRL, conjunctiva/corneas clear, EOM's intact, fundi     benign, both eyes  Ears:    Normal TM's and external ear canals, both ears  Nose:   Nares normal, septum midline, mucosa normal, no drainage    or sinus tenderness  Throat:   Lips, mucosa, and tongue normal; teeth and gums normal  Neck:   Supple, symmetrical, trachea midline, no adenopathy;    Thyroid: no enlargement/tenderness/nodules  Back:     Symmetric, no curvature, ROM normal, no CVA tenderness  Lungs:     Clear to auscultation bilaterally, respirations unlabored  Chest Wall:    No tenderness or deformity   Heart:    Regular rate and rhythm, S1 and S2 normal, no murmur, rub   or gallop  Breast Exam:    Normal breast exam  Abdomen:     Soft, non-tender, bowel sounds active all four quadrants,    no masses, no organomegaly  Genitalia:    Deferred  Rectal:    Extremities:   Extremities normal, atraumatic, no cyanosis or edema  Pulses:   2+ and symmetric all extremities  Skin:   Skin color, texture, turgor normal, no rashes or lesions  Lymph nodes:   Cervical, supraclavicular, and axillary nodes normal  Neurologic:   CNII-XII intact, normal strength, sensation and reflexes    throughout          Assessment & Plan:

## 2012-06-03 ENCOUNTER — Encounter: Payer: Self-pay | Admitting: Gastroenterology

## 2012-06-17 DIAGNOSIS — Z0000 Encounter for general adult medical examination without abnormal findings: Secondary | ICD-10-CM | POA: Insufficient documentation

## 2012-06-17 NOTE — Assessment & Plan Note (Signed)
Chronic problem.  Adequate control.  Asymptomatic.  Stressed importance of healthy diet and regular exercise.  No med changes at this time.  Will follow.

## 2012-06-17 NOTE — Assessment & Plan Note (Signed)
Pt's PE WNL w/ exception of obesity.  EKG done- see document for interpretation.  Refer for colonoscopy and mammo.  Check labs.  Anticipatory guidance provided.

## 2012-06-17 NOTE — Assessment & Plan Note (Signed)
Ongoing problem for pt.  Check labs to risk stratify.  Stressed importance of regular exercise- pt to try for at least 30 minutes 3-4x/week.  Pt ok to start Green Coffee Extract.  Will follow progress.

## 2012-06-18 ENCOUNTER — Ambulatory Visit (INDEPENDENT_AMBULATORY_CARE_PROVIDER_SITE_OTHER): Payer: Medicare Other | Admitting: Family Medicine

## 2012-06-18 ENCOUNTER — Encounter: Payer: Self-pay | Admitting: Family Medicine

## 2012-06-18 VITALS — BP 125/78 | HR 90 | Temp 98.6°F | Ht 62.0 in | Wt 191.2 lb

## 2012-06-18 DIAGNOSIS — L0291 Cutaneous abscess, unspecified: Secondary | ICD-10-CM | POA: Diagnosis not present

## 2012-06-18 DIAGNOSIS — L0390 Cellulitis, unspecified: Secondary | ICD-10-CM | POA: Diagnosis not present

## 2012-06-18 MED ORDER — DOXYCYCLINE HYCLATE 100 MG PO TABS: 100.0000 mg | ORAL_TABLET | Freq: Two times a day (BID) | ORAL | Status: AC

## 2012-06-18 NOTE — Patient Instructions (Addendum)
Start the Doxycycline twice daily w/ food Apply warm compresses to help area to drain Call with any questions or concerns Hang in there!!!

## 2012-06-18 NOTE — Progress Notes (Signed)
  Subjective:    Patient ID: Megan Duncan, female    DOB: [birth year], 66 y.o.   MRN: [id number]  HPI Abscess- hx of similar.  Located on L breast.  Reports 1/2 dollar size, 'fiery red'.  Has upcoming mammo.  Has similar area in groin that is very tender but not currently red and no bump present.  First noticed 1 week ago.  Applying neosporin w/out improvement.   Review of Systems For ROS see HPI     Objective:   Physical Exam  Vitals reviewed. Constitutional: She appears well-developed and well-nourished. No distress.  Skin: Skin is warm and dry. There is erythema (1/2 dollar sized area of erythema and induration w/out fluctuance or drainage under L breast).          Assessment & Plan:

## 2012-06-18 NOTE — Assessment & Plan Note (Signed)
New.  Start doxy to cover for possible MRSA.  No area to I&D.  Reviewed supportive care and red flags that should prompt return.  Pt expressed understanding and is in agreement w/ plan.

## 2012-07-01 ENCOUNTER — Ambulatory Visit
Admission: RE | Admit: 2012-07-01 | Discharge: 2012-07-01 | Disposition: A | Discharge status: Home / Self Care | Payer: Medicare Other | Source: Ambulatory Visit | Attending: Family Medicine | Admitting: Family Medicine

## 2012-07-01 ENCOUNTER — Encounter: Payer: Self-pay | Admitting: *Deleted

## 2012-07-01 DIAGNOSIS — Z1231 Encounter for screening mammogram for malignant neoplasm of breast: Secondary | ICD-10-CM

## 2012-07-01 DIAGNOSIS — Z13820 Encounter for screening for osteoporosis: Secondary | ICD-10-CM | POA: Diagnosis not present

## 2012-07-01 DIAGNOSIS — Z780 Asymptomatic menopausal state: Secondary | ICD-10-CM

## 2012-07-02 ENCOUNTER — Telehealth: Payer: Self-pay | Admitting: Family Medicine

## 2012-07-02 ENCOUNTER — Ambulatory Visit (AMBULATORY_SURGERY_CENTER): Payer: Medicare Other | Admitting: *Deleted

## 2012-07-02 VITALS — Ht 61.5 in | Wt 189.8 lb

## 2012-07-02 DIAGNOSIS — Z1211 Encounter for screening for malignant neoplasm of colon: Secondary | ICD-10-CM

## 2012-07-02 MED ORDER — MOVIPREP 100 G PO SOLR: ORAL | Status: DC

## 2012-07-02 NOTE — Telephone Encounter (Signed)
Patient calling, received lab results in mail, although normal, she has questions and would like Kim to call & go over the labs with her.

## 2012-07-03 NOTE — Telephone Encounter (Signed)
Spoke to pt to advise results/instructions. Pt understood.  

## 2012-07-07 ENCOUNTER — Ambulatory Visit (INDEPENDENT_AMBULATORY_CARE_PROVIDER_SITE_OTHER): Payer: Medicare Other | Admitting: Family Medicine

## 2012-07-07 ENCOUNTER — Encounter: Payer: Self-pay | Admitting: Family Medicine

## 2012-07-07 VITALS — BP 128/78 | HR 94 | Temp 98.8°F | Ht 62.0 in | Wt 190.6 lb

## 2012-07-07 DIAGNOSIS — L0390 Cellulitis, unspecified: Secondary | ICD-10-CM | POA: Diagnosis not present

## 2012-07-07 DIAGNOSIS — L0291 Cutaneous abscess, unspecified: Secondary | ICD-10-CM

## 2012-07-07 MED ORDER — CEPHALEXIN 500 MG PO CAPS: 500.0000 mg | ORAL_CAPSULE | Freq: Two times a day (BID) | ORAL | Status: AC

## 2012-07-07 NOTE — Assessment & Plan Note (Signed)
Area under breast is improving.  Area on R eyebrow not consistent w/ abscess- likely a pimple or ingrown hair.  Due to upcoming colonoscopy and pt's fear of residual infxn will start Keflex.  Reviewed supportive care and red flags that should prompt return.  Pt expressed understanding and is in agreement w/ plan.

## 2012-07-07 NOTE — Patient Instructions (Addendum)
This appears to be a pimple/ingrown hair Start the Keflex twice daily Apply hot compresses Good luck with the colonoscopy

## 2012-07-07 NOTE — Progress Notes (Signed)
  Subjective:    Patient ID: Megan Duncan, female    DOB: 06-20-1946, 66 y.o.   MRN: [id number]  HPI Abscess- pt was tx'd w/ doxy but this caused 'major nausea' so she stopped meds w/ 9 pills remaining.  Now has area above R eye that is red, painful.  Has been applying warm compresses.  First noticed on Friday.  No drainage.   Review of Systems For ROS see HPI     Objective:   Physical Exam  Vitals reviewed. Constitutional: She appears well-developed and well-nourished. No distress.  HENT:  Head: Normocephalic and atraumatic.  Skin: Skin is warm and dry. There is erythema (1 cm area on R orbital ridge in eyebrow w/ some induration, painful to palpation.  no drainage or fluctuance).          Assessment & Plan:

## 2012-07-09 ENCOUNTER — Encounter: Payer: Self-pay | Admitting: Family Medicine

## 2012-07-16 ENCOUNTER — Ambulatory Visit (AMBULATORY_SURGERY_CENTER): Payer: Medicare Other | Admitting: Gastroenterology

## 2012-07-16 ENCOUNTER — Encounter: Payer: Self-pay | Admitting: Gastroenterology

## 2012-07-16 VITALS — BP 127/77 | HR 79 | Temp 97.8°F | Resp 21 | Ht 61.5 in | Wt 189.0 lb

## 2012-07-16 DIAGNOSIS — Z1211 Encounter for screening for malignant neoplasm of colon: Secondary | ICD-10-CM | POA: Diagnosis not present

## 2012-07-16 DIAGNOSIS — D126 Benign neoplasm of colon, unspecified: Secondary | ICD-10-CM | POA: Diagnosis not present

## 2012-07-16 DIAGNOSIS — K5730 Diverticulosis of large intestine without perforation or abscess without bleeding: Secondary | ICD-10-CM

## 2012-07-16 MED ORDER — SODIUM CHLORIDE 0.9 % IV SOLN: 500.0000 mL | INTRAVENOUS | Status: DC

## 2012-07-16 NOTE — Progress Notes (Signed)
Pressure applied to abdomen to reach cecum.  

## 2012-07-16 NOTE — Progress Notes (Signed)
Patient did not experience any of the following events: a burn prior to discharge; a fall within the facility; wrong site/side/patient/procedure/implant event; or a hospital transfer or hospital admission upon discharge from the facility. (G8907) Patient did not have preoperative order for IV antibiotic SSI prophylaxis. (G8918)  

## 2012-07-16 NOTE — Op Note (Signed)
[facility] [address] [place], [address]   COLONOSCOPY PROCEDURE REPORT  PATIENT: Megan Duncan, Megan Duncan  MR#: [id number] BIRTHDATE: [birth year] , 66  yrs. old GENDER: Female ENDOSCOPIST: Mardella Layman, MD, Clementeen Graham REFERRED BY:  Neena Rhymes, M.D. PROCEDURE DATE:  07/16/2012 PROCEDURE:   Colonoscopy with snare polypectomy ASA CLASS:   Class II INDICATIONS:average risk patient for colon cancer. MEDICATIONS: Propofol (Diprivan) 250 mg IV  DESCRIPTION OF PROCEDURE:   After the risks and benefits and of the procedure were explained, informed consent was obtained.  A digital rectal exam revealed external hemorrhoids.    The LB [id number]  endoscope was introduced through the anus and advanced to the cecum, which was identified by both the appendix and ileocecal valve .  The quality of the prep was good, using MoviPrep .  The instrument was then slowly withdrawn as the colon was fully examined.     COLON FINDINGS: There was severe diverticulosis noted in the descending colon and sigmoid colon with associated angulation.   A flat polyp ranging between 3-23mm in size with a mucous cap was found at the cecum.  A polypectomy was performed using snare cautery.  The resection was complete and the polyp tissue was completely retrieved.   Fixed sigmoid area mad exam some what difficult,numerous diverticulae noted.     Retroflexed views revealed no abnormalities.     The scope was then withdrawn from the patient and the procedure completed.  COMPLICATIONS: There were no complications. ENDOSCOPIC IMPRESSION: 1.   There was severe diverticulosis noted in the descending colon and sigmoid colon 2.   Flat polyp ranging between 3-58mm in size was found at the cecum; polypectomy was performed using snare cautery 3.   Fixed sigmoid area mad exam some what difficult,numerous diverticulae noted...  RECOMMENDATIONS: 1.  await biopsy results 2.  If the polyp(s)  removed today are proven to be adenomatous (pre-cancerous) polyps, you will need a colonoscopy in 3 years. Otherwise you should continue to follow colorectal cancer screening guidelines for "routine risk" patients with a colonoscopy in 10 years.  You will receive a letter within 1-2 weeks with the results of your biopsy as well as final recommendations.  Please call my office if you have not received a letter after 3 weeks. 3.  High fiber diet with liberal fluid intake.   REPEAT EXAM:  cc:  _______________________________ eSignedMardella Layman, MD, [facility] 07/16/2012 9:52 AM

## 2012-07-16 NOTE — Patient Instructions (Addendum)

## 2012-07-17 ENCOUNTER — Telehealth: Payer: Self-pay | Admitting: *Deleted

## 2012-07-17 NOTE — Telephone Encounter (Signed)
  Follow up Call-  Call back number 07/16/2012  Post procedure Call Back phone  # [phone]  Permission to leave phone message Yes     Patient questions:  Do you have a fever, pain , or abdominal swelling? no Pain Score  0 *  Have you tolerated food without any problems? yes  Have you been able to return to your normal activities? yes  Do you have any questions about your discharge instructions: Diet   no Medications  no Follow up visit  no  Do you have questions or concerns about your Care? no  Actions: * If pain score is 4 or above: No action needed, pain <4.  Pt. Stated everyone was wonderful.

## 2012-07-21 ENCOUNTER — Encounter: Payer: Self-pay | Admitting: Gastroenterology

## 2012-08-05 ENCOUNTER — Telehealth: Payer: Self-pay | Admitting: Gastroenterology

## 2012-08-05 ENCOUNTER — Ambulatory Visit: Payer: Medicare Other | Admitting: Nurse Practitioner

## 2012-08-05 ENCOUNTER — Telehealth: Payer: Self-pay | Admitting: *Deleted

## 2012-08-05 NOTE — Telephone Encounter (Signed)
Pt notes recent testing with MD Sheryn Bison and given DX for severe diverticulitis, pt notes she is now experiancing stomach discomfort, advised MD Beverely Low is not in the office today and will return tomorrow 08-06-12 however pt will need to contact GI MD per recent DX and GI specialist with this concern, advised pt if she can not be seen by MD  Jarold Motto office today to please call our office back and we will be glad to see her with another MD, pt understood and will call our office if any further assisance needed, advised the notations are in the chart for her recent testing with MD Jarold Motto, office note still pending

## 2012-08-05 NOTE — Telephone Encounter (Signed)
Noted.  Agree w/ calling GI

## 2012-08-05 NOTE — Telephone Encounter (Signed)
Pt states that since she had her colonoscopy 07/16/12 she has had severe stomach discomfort and burning. Pt states that she was told after her colon that she had severe diverticulitis and was not given any medicine to treat this. Per report pt has severe diverticulosis not diverticulitis. Discussed the difference with pt. Pt states she has talked to her PCP and was told to follow-up with GI since the problems started after her procedure. Pt scheduled to see Willette Cluster NP today at 3pm. Pt aware of appt date and time.

## 2012-08-06 ENCOUNTER — Ambulatory Visit (INDEPENDENT_AMBULATORY_CARE_PROVIDER_SITE_OTHER): Payer: Medicare Other | Admitting: Nurse Practitioner

## 2012-08-06 ENCOUNTER — Encounter: Payer: Self-pay | Admitting: Nurse Practitioner

## 2012-08-06 VITALS — BP 98/60 | HR 84 | Ht 60.0 in | Wt 190.2 lb

## 2012-08-06 DIAGNOSIS — R109 Unspecified abdominal pain: Secondary | ICD-10-CM | POA: Diagnosis not present

## 2012-08-06 DIAGNOSIS — R1030 Lower abdominal pain, unspecified: Secondary | ICD-10-CM

## 2012-08-06 DIAGNOSIS — G8929 Other chronic pain: Secondary | ICD-10-CM | POA: Insufficient documentation

## 2012-08-06 DIAGNOSIS — M545 Low back pain: Secondary | ICD-10-CM

## 2012-08-06 HISTORY — DX: Other chronic pain: G89.29

## 2012-08-06 HISTORY — DX: Lower abdominal pain, unspecified: R10.30

## 2012-08-06 MED ORDER — METRONIDAZOLE 500 MG PO TABS: ORAL_TABLET | ORAL | Status: DC

## 2012-08-06 MED ORDER — CIPROFLOXACIN HCL 250 MG PO TABS: 250.0000 mg | ORAL_TABLET | Freq: Two times a day (BID) | ORAL | Status: DC

## 2012-08-06 NOTE — Patient Instructions (Addendum)
I sent prescriptions for Cipro and Metronidazole to [facility], [address]   Call [name] and let her know how you are doing.

## 2012-08-07 ENCOUNTER — Encounter: Payer: Self-pay | Admitting: Nurse Practitioner

## 2012-08-07 NOTE — Progress Notes (Signed)
08/07/2012 Megan Duncan [id number] 05/12/1946   History of Present Illness:   Patient is a 66 year old female who had a screening colonoscopy by Dr. Jarold Motto 07/16/12. Findings included severe diverticulosis of the descending and sigmoid colon and a cecal polyp. Polyp pathology compatible with tubular adenoma. Patient comes in today for evaluation of lower abdominal pain and lower back pain present since colonoscopy. Her lower abdomen is burning/cramping the discomfort seems to be most pronounced in the left lower quadrant with radiation around to left lower back or perhaps vice versa, she really can't tell. Patient gives a history of lower back pain of which the cause has never been determined. No fevers at home. Her bowel movements are unchanged. Patient does sometimes have sensation of incomplete evacuation but this is not a new problem. She also reports a history of urinary problems. Sometimes her bladder does not empty well but this is not a new problem either. No dysuria. No flank pain. No hematuria. Patient is emotional, she's tired of dealing with this discomfort. She wants to feel better so she can exercise and lose weight.    Current Medications, Allergies, Past Medical History, Past Surgical History, Family History and Social History were reviewed in [employer] record.   Physical Exam: General: Well developed , white female in no acute distress Head: Normocephalic and atraumatic Eyes:  sclerae anicteric, conjunctiva pink  Ears: Normal auditory acuity Lungs: Clear throughout to auscultation Heart: Regular rate and rhythm Abdomen: Soft, nondistended. Mild to moderate left lower quadrant tenderness. No masses, no hepatomegaly. Normal bowel sounds Musculoskeletal: Symmetrical with no gross deformities  Extremities: No edema  Neurological: Alert oriented x 4, grossly nonfocal Psychological:  Alert and cooperative. Emotional  Assessment and  Recommendations: 57. 66 year old female with lower abdominal discomfort, predominantly left-sided with radiation around to left lower back. Patient has a history of low back pain, she is not certain pain is not originating in her left lower back and radiating around to her lower abdomen. She is tender in the left lower quadrant however patient does have a known history of severe left-sided diverticulosis as determined by her recent colonoscopy 07/16/12. Given her history of low back problems I have asked the patient to try Flexeril for a day or so (she has this at home). In case Flexeril does not help I will give her a prescription for antibiotics for presumed diverticulitis. Patient will call us in a few days with a condition update.  2. history of colon polyps. And adenomatous polyp was removed 07/16/12.

## 2012-08-07 NOTE — Telephone Encounter (Signed)
Pt noted to be seen by GI on 08-06-12, MD Tabori made aware verbally

## 2012-08-11 ENCOUNTER — Telehealth: Payer: Self-pay | Admitting: Gastroenterology

## 2012-08-11 NOTE — Telephone Encounter (Signed)
Be sure she has started the cipro and flagyl and takes it for at  Least 10 days. Yes antibiotics can change the color of stools--doesn't sound like she thinks it is blood.  Aske her to call back Thursday after being on the ABX for a few days and let us know how she is feeling

## 2012-08-11 NOTE — Progress Notes (Signed)
Agree with initial assessment and plans. NP Wilmon Pali to get clinical follow up, as planned

## 2012-08-11 NOTE — Telephone Encounter (Signed)
Informed pt of Amy's instructions/recommendations; she stated understanding and will call back Thursday.

## 2012-08-11 NOTE — Telephone Encounter (Signed)
Pt saw Willette Cluster last week d/t abdominal pain and low back pain since her COLON with Dr Jarold Motto om 07/16/12. Pt had severe Diverticulosis and polyp removal during the procedure. She was to take her Flexeril and if the abdominal pain continues, begin Cipro and Flagyl. Today, she reports pain on her back side near the kidney area. Her stools are softer now, semi formed and are darker in color- a burgundy brown. She didn't know whether the meds were causing the dark stools, so she hasn't taken today's. Please advise. Thanks.

## 2012-09-17 DIAGNOSIS — M545 Low back pain: Secondary | ICD-10-CM | POA: Diagnosis not present

## 2012-09-23 DIAGNOSIS — M545 Low back pain: Secondary | ICD-10-CM | POA: Diagnosis not present

## 2012-09-24 DIAGNOSIS — Z23 Encounter for immunization: Secondary | ICD-10-CM | POA: Diagnosis not present

## 2012-09-30 DIAGNOSIS — M545 Low back pain: Secondary | ICD-10-CM | POA: Diagnosis not present

## 2012-10-02 DIAGNOSIS — M545 Low back pain: Secondary | ICD-10-CM | POA: Diagnosis not present

## 2012-10-13 DIAGNOSIS — M545 Low back pain: Secondary | ICD-10-CM | POA: Diagnosis not present

## 2012-10-15 DIAGNOSIS — M545 Low back pain: Secondary | ICD-10-CM | POA: Diagnosis not present

## 2012-10-20 DIAGNOSIS — M545 Low back pain: Secondary | ICD-10-CM | POA: Diagnosis not present

## 2012-10-23 DIAGNOSIS — M545 Low back pain: Secondary | ICD-10-CM | POA: Diagnosis not present

## 2012-12-01 ENCOUNTER — Other Ambulatory Visit: Payer: Self-pay | Admitting: Family Medicine

## 2012-12-02 NOTE — Telephone Encounter (Signed)
Last OV 07-07-12, last filled 06-02-12 #[phone]

## 2013-02-23 ENCOUNTER — Telehealth: Payer: Self-pay | Admitting: Family Medicine

## 2013-02-23 NOTE — Telephone Encounter (Signed)
Patient is requesting a refill of clorezepate to be sent to [facility]

## 2013-02-24 ENCOUNTER — Other Ambulatory Visit: Payer: Self-pay | Admitting: *Deleted

## 2013-02-24 MED ORDER — CLORAZEPATE DIPOTASSIUM 3.75 MG PO TABS: 3.7500 mg | ORAL_TABLET | Freq: Two times a day (BID) | ORAL | Status: DC | PRN

## 2013-02-24 NOTE — Telephone Encounter (Signed)
Rx printed and form f/o and both faxed to [facility].//AB/CMA

## 2013-02-24 NOTE — Telephone Encounter (Signed)
Rx faxed to [facility].//AB/CMA

## 2013-03-05 DIAGNOSIS — M9981 Other biomechanical lesions of cervical region: Secondary | ICD-10-CM | POA: Diagnosis not present

## 2013-03-05 DIAGNOSIS — M5137 Other intervertebral disc degeneration, lumbosacral region: Secondary | ICD-10-CM | POA: Diagnosis not present

## 2013-03-05 DIAGNOSIS — M5030 Other cervical disc degeneration, unspecified cervical region: Secondary | ICD-10-CM | POA: Diagnosis not present

## 2013-03-05 DIAGNOSIS — M5430 Sciatica, unspecified side: Secondary | ICD-10-CM | POA: Diagnosis not present

## 2013-03-05 DIAGNOSIS — M62838 Other muscle spasm: Secondary | ICD-10-CM | POA: Diagnosis not present

## 2013-03-05 DIAGNOSIS — M999 Biomechanical lesion, unspecified: Secondary | ICD-10-CM | POA: Diagnosis not present

## 2013-03-09 DIAGNOSIS — M5137 Other intervertebral disc degeneration, lumbosacral region: Secondary | ICD-10-CM | POA: Diagnosis not present

## 2013-03-09 DIAGNOSIS — M9981 Other biomechanical lesions of cervical region: Secondary | ICD-10-CM | POA: Diagnosis not present

## 2013-03-09 DIAGNOSIS — M5030 Other cervical disc degeneration, unspecified cervical region: Secondary | ICD-10-CM | POA: Diagnosis not present

## 2013-03-09 DIAGNOSIS — M999 Biomechanical lesion, unspecified: Secondary | ICD-10-CM | POA: Diagnosis not present

## 2013-03-09 DIAGNOSIS — M5430 Sciatica, unspecified side: Secondary | ICD-10-CM | POA: Diagnosis not present

## 2013-03-09 DIAGNOSIS — M62838 Other muscle spasm: Secondary | ICD-10-CM | POA: Diagnosis not present

## 2013-03-11 DIAGNOSIS — M62838 Other muscle spasm: Secondary | ICD-10-CM | POA: Diagnosis not present

## 2013-03-11 DIAGNOSIS — M5430 Sciatica, unspecified side: Secondary | ICD-10-CM | POA: Diagnosis not present

## 2013-03-11 DIAGNOSIS — M999 Biomechanical lesion, unspecified: Secondary | ICD-10-CM | POA: Diagnosis not present

## 2013-03-11 DIAGNOSIS — M5030 Other cervical disc degeneration, unspecified cervical region: Secondary | ICD-10-CM | POA: Diagnosis not present

## 2013-03-11 DIAGNOSIS — M9981 Other biomechanical lesions of cervical region: Secondary | ICD-10-CM | POA: Diagnosis not present

## 2013-03-11 DIAGNOSIS — M5137 Other intervertebral disc degeneration, lumbosacral region: Secondary | ICD-10-CM | POA: Diagnosis not present

## 2013-03-12 DIAGNOSIS — M5137 Other intervertebral disc degeneration, lumbosacral region: Secondary | ICD-10-CM | POA: Diagnosis not present

## 2013-03-12 DIAGNOSIS — M5030 Other cervical disc degeneration, unspecified cervical region: Secondary | ICD-10-CM | POA: Diagnosis not present

## 2013-03-12 DIAGNOSIS — M999 Biomechanical lesion, unspecified: Secondary | ICD-10-CM | POA: Diagnosis not present

## 2013-03-12 DIAGNOSIS — M9981 Other biomechanical lesions of cervical region: Secondary | ICD-10-CM | POA: Diagnosis not present

## 2013-03-12 DIAGNOSIS — M5430 Sciatica, unspecified side: Secondary | ICD-10-CM | POA: Diagnosis not present

## 2013-03-12 DIAGNOSIS — M62838 Other muscle spasm: Secondary | ICD-10-CM | POA: Diagnosis not present

## 2013-03-16 DIAGNOSIS — M5137 Other intervertebral disc degeneration, lumbosacral region: Secondary | ICD-10-CM | POA: Diagnosis not present

## 2013-03-16 DIAGNOSIS — M62838 Other muscle spasm: Secondary | ICD-10-CM | POA: Diagnosis not present

## 2013-03-16 DIAGNOSIS — M999 Biomechanical lesion, unspecified: Secondary | ICD-10-CM | POA: Diagnosis not present

## 2013-03-16 DIAGNOSIS — M9981 Other biomechanical lesions of cervical region: Secondary | ICD-10-CM | POA: Diagnosis not present

## 2013-03-16 DIAGNOSIS — M5030 Other cervical disc degeneration, unspecified cervical region: Secondary | ICD-10-CM | POA: Diagnosis not present

## 2013-03-16 DIAGNOSIS — M5430 Sciatica, unspecified side: Secondary | ICD-10-CM | POA: Diagnosis not present

## 2013-03-23 DIAGNOSIS — M999 Biomechanical lesion, unspecified: Secondary | ICD-10-CM | POA: Diagnosis not present

## 2013-03-23 DIAGNOSIS — M5137 Other intervertebral disc degeneration, lumbosacral region: Secondary | ICD-10-CM | POA: Diagnosis not present

## 2013-03-23 DIAGNOSIS — M9981 Other biomechanical lesions of cervical region: Secondary | ICD-10-CM | POA: Diagnosis not present

## 2013-03-23 DIAGNOSIS — M5030 Other cervical disc degeneration, unspecified cervical region: Secondary | ICD-10-CM | POA: Diagnosis not present

## 2013-03-23 DIAGNOSIS — M62838 Other muscle spasm: Secondary | ICD-10-CM | POA: Diagnosis not present

## 2013-03-23 DIAGNOSIS — M5430 Sciatica, unspecified side: Secondary | ICD-10-CM | POA: Diagnosis not present

## 2013-03-24 DIAGNOSIS — M999 Biomechanical lesion, unspecified: Secondary | ICD-10-CM | POA: Diagnosis not present

## 2013-03-24 DIAGNOSIS — M62838 Other muscle spasm: Secondary | ICD-10-CM | POA: Diagnosis not present

## 2013-03-24 DIAGNOSIS — M5030 Other cervical disc degeneration, unspecified cervical region: Secondary | ICD-10-CM | POA: Diagnosis not present

## 2013-03-24 DIAGNOSIS — M5430 Sciatica, unspecified side: Secondary | ICD-10-CM | POA: Diagnosis not present

## 2013-03-24 DIAGNOSIS — M9981 Other biomechanical lesions of cervical region: Secondary | ICD-10-CM | POA: Diagnosis not present

## 2013-03-24 DIAGNOSIS — M5137 Other intervertebral disc degeneration, lumbosacral region: Secondary | ICD-10-CM | POA: Diagnosis not present

## 2013-03-25 DIAGNOSIS — M5030 Other cervical disc degeneration, unspecified cervical region: Secondary | ICD-10-CM | POA: Diagnosis not present

## 2013-03-25 DIAGNOSIS — M62838 Other muscle spasm: Secondary | ICD-10-CM | POA: Diagnosis not present

## 2013-03-25 DIAGNOSIS — M999 Biomechanical lesion, unspecified: Secondary | ICD-10-CM | POA: Diagnosis not present

## 2013-03-25 DIAGNOSIS — M9981 Other biomechanical lesions of cervical region: Secondary | ICD-10-CM | POA: Diagnosis not present

## 2013-03-25 DIAGNOSIS — M5430 Sciatica, unspecified side: Secondary | ICD-10-CM | POA: Diagnosis not present

## 2013-03-25 DIAGNOSIS — M5137 Other intervertebral disc degeneration, lumbosacral region: Secondary | ICD-10-CM | POA: Diagnosis not present

## 2013-03-30 DIAGNOSIS — M5430 Sciatica, unspecified side: Secondary | ICD-10-CM | POA: Diagnosis not present

## 2013-03-30 DIAGNOSIS — M5030 Other cervical disc degeneration, unspecified cervical region: Secondary | ICD-10-CM | POA: Diagnosis not present

## 2013-03-30 DIAGNOSIS — M9981 Other biomechanical lesions of cervical region: Secondary | ICD-10-CM | POA: Diagnosis not present

## 2013-03-30 DIAGNOSIS — M999 Biomechanical lesion, unspecified: Secondary | ICD-10-CM | POA: Diagnosis not present

## 2013-03-30 DIAGNOSIS — M5137 Other intervertebral disc degeneration, lumbosacral region: Secondary | ICD-10-CM | POA: Diagnosis not present

## 2013-03-30 DIAGNOSIS — M62838 Other muscle spasm: Secondary | ICD-10-CM | POA: Diagnosis not present

## 2013-04-01 DIAGNOSIS — M5137 Other intervertebral disc degeneration, lumbosacral region: Secondary | ICD-10-CM | POA: Diagnosis not present

## 2013-04-01 DIAGNOSIS — M999 Biomechanical lesion, unspecified: Secondary | ICD-10-CM | POA: Diagnosis not present

## 2013-04-01 DIAGNOSIS — M9981 Other biomechanical lesions of cervical region: Secondary | ICD-10-CM | POA: Diagnosis not present

## 2013-04-01 DIAGNOSIS — M62838 Other muscle spasm: Secondary | ICD-10-CM | POA: Diagnosis not present

## 2013-04-01 DIAGNOSIS — M5430 Sciatica, unspecified side: Secondary | ICD-10-CM | POA: Diagnosis not present

## 2013-04-01 DIAGNOSIS — M5030 Other cervical disc degeneration, unspecified cervical region: Secondary | ICD-10-CM | POA: Diagnosis not present

## 2013-04-02 DIAGNOSIS — M9981 Other biomechanical lesions of cervical region: Secondary | ICD-10-CM | POA: Diagnosis not present

## 2013-04-02 DIAGNOSIS — M5137 Other intervertebral disc degeneration, lumbosacral region: Secondary | ICD-10-CM | POA: Diagnosis not present

## 2013-04-02 DIAGNOSIS — M62838 Other muscle spasm: Secondary | ICD-10-CM | POA: Diagnosis not present

## 2013-04-02 DIAGNOSIS — M5030 Other cervical disc degeneration, unspecified cervical region: Secondary | ICD-10-CM | POA: Diagnosis not present

## 2013-04-02 DIAGNOSIS — M5430 Sciatica, unspecified side: Secondary | ICD-10-CM | POA: Diagnosis not present

## 2013-04-02 DIAGNOSIS — M999 Biomechanical lesion, unspecified: Secondary | ICD-10-CM | POA: Diagnosis not present

## 2013-05-07 DIAGNOSIS — M999 Biomechanical lesion, unspecified: Secondary | ICD-10-CM | POA: Diagnosis not present

## 2013-05-07 DIAGNOSIS — M62838 Other muscle spasm: Secondary | ICD-10-CM | POA: Diagnosis not present

## 2013-05-07 DIAGNOSIS — M5137 Other intervertebral disc degeneration, lumbosacral region: Secondary | ICD-10-CM | POA: Diagnosis not present

## 2013-05-07 DIAGNOSIS — M5430 Sciatica, unspecified side: Secondary | ICD-10-CM | POA: Diagnosis not present

## 2013-05-07 DIAGNOSIS — M9981 Other biomechanical lesions of cervical region: Secondary | ICD-10-CM | POA: Diagnosis not present

## 2013-05-07 DIAGNOSIS — M5030 Other cervical disc degeneration, unspecified cervical region: Secondary | ICD-10-CM | POA: Diagnosis not present

## 2013-07-13 ENCOUNTER — Other Ambulatory Visit: Payer: Self-pay | Admitting: Family Medicine

## 2013-07-14 NOTE — Telephone Encounter (Signed)
Med filled. Pt due for BP follow up check.

## 2013-08-11 DIAGNOSIS — Z23 Encounter for immunization: Secondary | ICD-10-CM | POA: Diagnosis not present

## 2013-08-24 DIAGNOSIS — M5137 Other intervertebral disc degeneration, lumbosacral region: Secondary | ICD-10-CM | POA: Diagnosis not present

## 2013-08-24 DIAGNOSIS — M999 Biomechanical lesion, unspecified: Secondary | ICD-10-CM | POA: Diagnosis not present

## 2013-08-24 DIAGNOSIS — M5430 Sciatica, unspecified side: Secondary | ICD-10-CM | POA: Diagnosis not present

## 2013-08-25 DIAGNOSIS — M999 Biomechanical lesion, unspecified: Secondary | ICD-10-CM | POA: Diagnosis not present

## 2013-08-25 DIAGNOSIS — M5137 Other intervertebral disc degeneration, lumbosacral region: Secondary | ICD-10-CM | POA: Diagnosis not present

## 2013-08-25 DIAGNOSIS — M5430 Sciatica, unspecified side: Secondary | ICD-10-CM | POA: Diagnosis not present

## 2013-08-27 DIAGNOSIS — M999 Biomechanical lesion, unspecified: Secondary | ICD-10-CM | POA: Diagnosis not present

## 2013-08-27 DIAGNOSIS — M5430 Sciatica, unspecified side: Secondary | ICD-10-CM | POA: Diagnosis not present

## 2013-08-27 DIAGNOSIS — M5137 Other intervertebral disc degeneration, lumbosacral region: Secondary | ICD-10-CM | POA: Diagnosis not present

## 2013-08-31 DIAGNOSIS — M5430 Sciatica, unspecified side: Secondary | ICD-10-CM | POA: Diagnosis not present

## 2013-08-31 DIAGNOSIS — M5137 Other intervertebral disc degeneration, lumbosacral region: Secondary | ICD-10-CM | POA: Diagnosis not present

## 2013-08-31 DIAGNOSIS — M999 Biomechanical lesion, unspecified: Secondary | ICD-10-CM | POA: Diagnosis not present

## 2013-09-02 DIAGNOSIS — M5137 Other intervertebral disc degeneration, lumbosacral region: Secondary | ICD-10-CM | POA: Diagnosis not present

## 2013-09-02 DIAGNOSIS — M5430 Sciatica, unspecified side: Secondary | ICD-10-CM | POA: Diagnosis not present

## 2013-09-02 DIAGNOSIS — M999 Biomechanical lesion, unspecified: Secondary | ICD-10-CM | POA: Diagnosis not present

## 2013-09-14 ENCOUNTER — Ambulatory Visit (INDEPENDENT_AMBULATORY_CARE_PROVIDER_SITE_OTHER): Payer: Medicare Other | Admitting: Family Medicine

## 2013-09-14 ENCOUNTER — Encounter: Payer: Self-pay | Admitting: Family Medicine

## 2013-09-14 VITALS — BP 120/82 | HR 79 | Temp 98.2°F | Resp 16 | Wt 194.4 lb

## 2013-09-14 DIAGNOSIS — J0100 Acute maxillary sinusitis, unspecified: Secondary | ICD-10-CM | POA: Insufficient documentation

## 2013-09-14 HISTORY — DX: Acute maxillary sinusitis, unspecified: J01.00

## 2013-09-14 MED ORDER — AMOXICILLIN 875 MG PO TABS: 875.0000 mg | ORAL_TABLET | Freq: Two times a day (BID) | ORAL | Status: DC

## 2013-09-14 MED ORDER — PROMETHAZINE-DM 6.25-15 MG/5ML PO SYRP: 5.0000 mL | ORAL_SOLUTION | Freq: Four times a day (QID) | ORAL | Status: DC | PRN

## 2013-09-14 MED ORDER — LISINOPRIL-HYDROCHLOROTHIAZIDE 10-12.5 MG PO TABS: ORAL_TABLET | ORAL | Status: DC

## 2013-09-14 NOTE — Assessment & Plan Note (Addendum)
Pt's sxs and PE consistent w/ infxn.  Start abx.  Cough syrup prn.  Reviewed supportive care and red flags that should prompt return.  Pt expressed understanding and is in agreement w/ plan.  

## 2013-09-14 NOTE — Progress Notes (Signed)
  Subjective:    Patient ID: Megan Duncan, female    DOB: 1946-02-26, 67 y.o.   MRN: [id number]  HPI URI- sxs started Friday w/ nasal congestion, hoarseness.  Bilateral ear pain/fullness.  Painful to swallow.  No sinus pain/pressure.  Chest tightness last night- 'that bronchitis feel'.  No N/V/D.  No fevers.  No known sick contacts.  + dry cough.   Review of Systems For ROS see HPI     Objective:   Physical Exam  Vitals reviewed. Constitutional: She appears well-developed and well-nourished. No distress.  HENT:  Head: Normocephalic and atraumatic.  Right Ear: Tympanic membrane normal.  Left Ear: Tympanic membrane normal.  Nose: Mucosal edema and rhinorrhea present. Right sinus exhibits maxillary sinus tenderness. Right sinus exhibits no frontal sinus tenderness. Left sinus exhibits maxillary sinus tenderness. Left sinus exhibits no frontal sinus tenderness.  Mouth/Throat: Uvula is midline and mucous membranes are normal. Posterior oropharyngeal erythema present. No oropharyngeal exudate.  Eyes: Conjunctivae and EOM are normal. Pupils are equal, round, and reactive to light.  Neck: Normal range of motion. Neck supple.  Cardiovascular: Normal rate, regular rhythm and normal heart sounds.   Pulmonary/Chest: Effort normal and breath sounds normal. No respiratory distress. She has no wheezes.  Lymphadenopathy:    She has no cervical adenopathy.          Assessment & Plan:

## 2013-09-14 NOTE — Patient Instructions (Addendum)
Schedule your complete physical at your convenience (last done in July) Start the amoxicillin twice daily for the sinuses Use the cough syrup at night- will cause drowsiness Mucinex DM for daytime cough Drink plenty of fluids REST! Hang in there!!!

## 2013-11-30 DIAGNOSIS — F3189 Other bipolar disorder: Secondary | ICD-10-CM | POA: Diagnosis not present

## 2013-12-02 ENCOUNTER — Telehealth: Payer: Self-pay | Admitting: *Deleted

## 2013-12-02 NOTE — Telephone Encounter (Signed)
Spoke with patient after she presented to the office extremely upset over the wait time on the telephone before she actually "spoke to a real person." Patient stated that she called several times and was in the "hold que" for up to 55 minutes at one point and 45 minutes at another time. Patient stated she needed to make an appointment to see Dr. Birdie Riddle to speak with her concerning a new medication she was starting but after she experienced the problem getting through on the phone she was considering leaving the practice.  I offered my apologizes and agreed that the wait time on the phone was unacceptable and needed to be corrected.  Searched through available appointment slots for Dr. Birdie Riddle and opened a "same day" tomorrow at 11:15. Patient thanked me for "thinking outside the box and going the extra mile to help her." I advised that we were here for our patients and strive to provide them with the great customer service they deserve.

## 2013-12-03 ENCOUNTER — Encounter: Payer: Self-pay | Admitting: Family Medicine

## 2013-12-03 ENCOUNTER — Ambulatory Visit (INDEPENDENT_AMBULATORY_CARE_PROVIDER_SITE_OTHER): Payer: Medicare Other | Admitting: Family Medicine

## 2013-12-03 VITALS — BP 132/86 | HR 72 | Temp 98.4°F | Resp 16 | Wt 194.4 lb

## 2013-12-03 DIAGNOSIS — E669 Obesity, unspecified: Secondary | ICD-10-CM

## 2013-12-03 DIAGNOSIS — Z833 Family history of diabetes mellitus: Secondary | ICD-10-CM

## 2013-12-03 DIAGNOSIS — F3132 Bipolar disorder, current episode depressed, moderate: Secondary | ICD-10-CM

## 2013-12-03 DIAGNOSIS — F3181 Bipolar II disorder: Secondary | ICD-10-CM | POA: Insufficient documentation

## 2013-12-03 LAB — BASIC METABOLIC PANEL
BUN: 14 mg/dL (ref 6–23)
CHLORIDE: 101 meq/L (ref 96–112)
CO2: 28 mEq/L (ref 19–32)
Calcium: 9.4 mg/dL (ref 8.4–10.5)
Creatinine, Ser: 0.8 mg/dL (ref 0.4–1.2)
GFR: 74.83 mL/min (ref >60.00)
Glucose, Bld: 75 mg/dL (ref 70–99)
POTASSIUM: 3.3 meq/L — AB (ref 3.5–5.1)
Sodium: 139 mEq/L (ref 135–145)

## 2013-12-03 LAB — LIPID PANEL
CHOL/HDL RATIO: 3
Cholesterol: 201 mg/dL — ABNORMAL HIGH (ref 0–200)
HDL: 64 mg/dL (ref >39.00)
Triglycerides: 158 mg/dL — ABNORMAL HIGH (ref 0.0–149.0)
VLDL: 31.6 mg/dL (ref 0.0–40.0)

## 2013-12-03 LAB — HEPATIC FUNCTION PANEL
ALBUMIN: 4.3 g/dL (ref 3.5–5.2)
ALK PHOS: 56 U/L (ref 39–117)
ALT: 20 U/L (ref 0–35)
AST: 20 U/L (ref 0–37)
BILIRUBIN TOTAL: 0.8 mg/dL (ref 0.3–1.2)
Bilirubin, Direct: 0.1 mg/dL (ref 0.0–0.3)
Total Protein: 7.9 g/dL (ref 6.0–8.3)

## 2013-12-03 LAB — LDL CHOLESTEROL, DIRECT: Direct LDL: 120.1 mg/dL

## 2013-12-03 LAB — HEMOGLOBIN A1C: HEMOGLOBIN A1C: 5.8 % (ref 4.6–6.5)

## 2013-12-03 NOTE — Patient Instructions (Signed)
Schedule your complete physical at your convenience We'll notify you of your lab results Start the [place]- worst case, we're back where we started Call with any questions or concerns (ext [phone]) Hang in there!

## 2013-12-03 NOTE — Progress Notes (Signed)
Patient received education resource, including the self-management goal and tool. Patient verbalized understanding. 

## 2013-12-03 NOTE — Progress Notes (Signed)
   Subjective:    Patient ID: Megan Duncan, female    DOB: 28-Nov-1945, 68 y.o.   MRN: [id number]  HPI Psych- pt reports she's been depressed for a 'number of years'.  Has been off meds for 3-4 yrs.  Seeing Dr Charlott Holler and was started on [place] for bipolar depression.  Pt saw that diabetes was possible side effect and this concerns her.  Father had diabetes.  Started Latuda yesterday.  'i just don't know what to do'.   Review of Systems For ROS see HPI     Objective:   Physical Exam  Vitals reviewed. Constitutional: She is oriented to person, place, and time. She appears well-developed and well-nourished.  HENT:  Head: Normocephalic and atraumatic.  Cardiovascular: Normal rate, regular rhythm and normal heart sounds.   Pulmonary/Chest: Effort normal and breath sounds normal. No respiratory distress. She has no wheezes. She has no rales.  Neurological: She is alert and oriented to person, place, and time.  Skin: Skin is warm and dry.  Psychiatric: Thought content normal.  Tearful, anxious          Assessment & Plan:

## 2013-12-04 ENCOUNTER — Encounter: Payer: Self-pay | Admitting: General Practice

## 2013-12-06 NOTE — Assessment & Plan Note (Signed)
Chronic problem for pt.  Continues to gain weight as she struggles w/ depression.  Check labs to risk stratify.  Will follow.

## 2013-12-06 NOTE — Assessment & Plan Note (Signed)
New to provider, ongoing for pt.  Has been following w/ Dr Charlott Holler.  Started on [employer] yesterday.  Worried about diabetes as possible side effect.  Check baseline labs today.  Will follow.

## 2013-12-10 DIAGNOSIS — F3189 Other bipolar disorder: Secondary | ICD-10-CM | POA: Diagnosis not present

## 2013-12-17 DIAGNOSIS — F3189 Other bipolar disorder: Secondary | ICD-10-CM | POA: Diagnosis not present

## 2013-12-20 ENCOUNTER — Other Ambulatory Visit: Payer: Self-pay | Admitting: Family Medicine

## 2013-12-21 NOTE — Telephone Encounter (Signed)
Med filled.  

## 2013-12-28 DIAGNOSIS — F3189 Other bipolar disorder: Secondary | ICD-10-CM | POA: Diagnosis not present

## 2014-01-25 DIAGNOSIS — F3189 Other bipolar disorder: Secondary | ICD-10-CM | POA: Diagnosis not present

## 2014-01-27 DIAGNOSIS — F3189 Other bipolar disorder: Secondary | ICD-10-CM | POA: Diagnosis not present

## 2014-02-10 DIAGNOSIS — F3189 Other bipolar disorder: Secondary | ICD-10-CM | POA: Diagnosis not present

## 2014-02-22 DIAGNOSIS — F3189 Other bipolar disorder: Secondary | ICD-10-CM | POA: Diagnosis not present

## 2014-03-03 ENCOUNTER — Telehealth: Payer: Self-pay | Admitting: Family Medicine

## 2014-03-03 ENCOUNTER — Ambulatory Visit (INDEPENDENT_AMBULATORY_CARE_PROVIDER_SITE_OTHER): Payer: Medicare Other | Admitting: Nurse Practitioner

## 2014-03-03 VITALS — BP 105/72 | HR 83 | Temp 98.3°F | Wt 195.0 lb

## 2014-03-03 DIAGNOSIS — H9319 Tinnitus, unspecified ear: Secondary | ICD-10-CM

## 2014-03-03 DIAGNOSIS — H6120 Impacted cerumen, unspecified ear: Secondary | ICD-10-CM | POA: Diagnosis not present

## 2014-03-03 DIAGNOSIS — M436 Torticollis: Secondary | ICD-10-CM

## 2014-03-03 MED ORDER — CYCLOBENZAPRINE HCL 10 MG PO TABS: 10.0000 mg | ORAL_TABLET | Freq: Three times a day (TID) | ORAL | Status: DC | PRN

## 2014-03-03 NOTE — Telephone Encounter (Signed)
Spoke with patient concerning ringing in ears. Started last night while not performing any activities at the time, dulled over night but has come back to the point of unbearable this afternoon. Advised to be seen. Scheduled appt with FNP.

## 2014-03-03 NOTE — Telephone Encounter (Signed)
Caller name: Taleen Prosser Relation to pt: self PCP: Dr. Birdie Riddle Call back number:[phone] Pharmacy: [facility]  Reason for call: High pitched buzzing in ear started yesterday. Patient is concerned and would like to know if this is due to a medication she is taking.

## 2014-03-03 NOTE — Progress Notes (Signed)
Pre-visit discussion using our clinic review tool. No additional management support is needed unless otherwise documented below in the visit note.  

## 2014-03-03 NOTE — Patient Instructions (Addendum)
Use debrox daily in both ears to soften wax. Return in week to have ears flushed.  For neck pain, perform daily stretches. Visit you tube for these exercise regimens: "physio Neck Exercises: Stretch & Relieve routine" by Mauri Brooklyn; "Pilates Seated Stretch" by [employer]; "Physio med Neck & Upper body stretch Exercises-Occupational Physiotherapy". Use muscle relaxer at night.  Torticollis, Acute You have developed a twisted neck (torticollis). This is usually a self-limited condition. CAUSES  Acute torticollis may be caused by malposition, trauma or infection. Most commonly, acute torticollis is caused by sleeping in an awkward position. Torticollis may also be caused by the flexion, extension or twisting of the neck muscles beyond their normal position. Sometimes, the exact cause may not be known. SYMPTOMS  Usually, there is pain and limited movement of the neck. Your neck may twist to one side. DIAGNOSIS  The diagnosis is often made by physical examination. X-rays, CT scans or MRIs may be done if there is a history of trauma or concern of infection. TREATMENT  For a common, stiff neck that develops during sleep, treatment is focused on relaxing the contracted neck muscle. Medications (including shots) may be used to treat the problem. Most cases resolve in several days. Torticollis usually responds to conservative physical therapy. If left untreated, the shortened and spastic neck muscle can cause deformities in the face and neck. Rarely, surgery is required. HOME CARE INSTRUCTIONS   Use over-the-counter and prescription medications as directed by your caregiver.  Do stretching exercises and massage the neck as directed by your caregiver.  Follow up with physical therapy if needed and as directed by your caregiver. SEEK IMMEDIATE MEDICAL CARE IF:   You develop difficulty breathing or noisy breathing (stridor).  You drool, develop trouble swallowing or have pain with  swallowing.  You develop numbness or weakness in the hands or feet.  You have changes in speech or vision.  You have problems with urination or bowel movements.  You have difficulty walking.  You have a fever.  You have increased pain. MAKE SURE YOU:   Understand these instructions.  Will watch your condition.  Will get help right away if you are not doing well or get worse. Document Released: 10/26/2000 Document Revised: 01/21/2012 Document Reviewed: 12/07/2009 [facility] Patient Information 2014 [place], [place].

## 2014-03-04 DIAGNOSIS — M436 Torticollis: Secondary | ICD-10-CM | POA: Insufficient documentation

## 2014-03-04 DIAGNOSIS — H6120 Impacted cerumen, unspecified ear: Secondary | ICD-10-CM

## 2014-03-04 DIAGNOSIS — H9319 Tinnitus, unspecified ear: Secondary | ICD-10-CM | POA: Insufficient documentation

## 2014-03-04 HISTORY — DX: Torticollis: M43.6

## 2014-03-04 HISTORY — DX: Impacted cerumen, unspecified ear: H61.20

## 2014-03-04 HISTORY — DX: Tinnitus, unspecified ear: H93.19

## 2014-03-04 NOTE — Assessment & Plan Note (Signed)
L side neck feels tight, tender to palpation, LROM due to pain. Heat, flexeril, daily neck stretches.

## 2014-03-04 NOTE — Assessment & Plan Note (Signed)
Ringing in L ear few days, intermittent. Unable to see TM due to ceruminosis.  Attempt to clear ears with flush unsuccessful.  F/u 1 week.

## 2014-03-04 NOTE — Assessment & Plan Note (Signed)
Bilateral. Warm water flush unsuccessful. Curette used. Unsuccessful. Debrox daily for 4-5 days.  Return 1 week.

## 2014-03-04 NOTE — Progress Notes (Signed)
Subjective:     Megan Duncan is a 68 y.o. female c/o tinnitis, pain in neck.  The following portions of the patient's history were reviewed and updated as appropriate: allergies, current medications, past medical history, past social history, past surgical history and problem list.  Review of Systems Constitutional: negative for fevers Ears, nose, mouth, throat, and face: negative for hearing loss, nasal congestion, sore throat and voice change Respiratory: negative for cough Musculoskeletal:positive for neck pain    Objective:    BP 105/72  Pulse 83  Temp(Src) 98.3 F (36.8 C) (Oral)  Wt 195 lb (88.451 kg)  SpO2 94% BP 105/72  Pulse 83  Temp(Src) 98.3 F (36.8 C) (Oral)  Wt 195 lb (88.451 kg)  SpO2 94% General appearance: alert, cooperative, appears stated age and no distress Head: Normocephalic, without obvious abnormality, atraumatic Eyes: negative findings: lids and lashes normal and conjunctiva  mildly injected Ears: unable to visualize TM due to cerumen bilat. Throat: lips, mucosa, and tongue normal; teeth and gums normal Neck: no adenopathy, supple, symmetrical, trachea midline, thyroid not enlarged, symmetric, no tenderness/mass/nodules and posterior neck feels tight on L side, knot over traps & longissmus area Back: mild kyphosis    Assessment:     1. Torticollis   2. Tinnitus   3. Ceruminosis         Plan:  See problem list for complete A&P F/u 1 week

## 2014-03-18 DIAGNOSIS — F3189 Other bipolar disorder: Secondary | ICD-10-CM | POA: Diagnosis not present

## 2014-03-22 DIAGNOSIS — M9981 Other biomechanical lesions of cervical region: Secondary | ICD-10-CM | POA: Diagnosis not present

## 2014-03-22 DIAGNOSIS — M999 Biomechanical lesion, unspecified: Secondary | ICD-10-CM | POA: Diagnosis not present

## 2014-03-22 DIAGNOSIS — M62838 Other muscle spasm: Secondary | ICD-10-CM | POA: Diagnosis not present

## 2014-03-22 DIAGNOSIS — M5137 Other intervertebral disc degeneration, lumbosacral region: Secondary | ICD-10-CM | POA: Diagnosis not present

## 2014-03-22 DIAGNOSIS — M5030 Other cervical disc degeneration, unspecified cervical region: Secondary | ICD-10-CM | POA: Diagnosis not present

## 2014-04-08 DIAGNOSIS — F3189 Other bipolar disorder: Secondary | ICD-10-CM | POA: Diagnosis not present

## 2014-05-11 ENCOUNTER — Telehealth: Payer: Self-pay | Admitting: Family Medicine

## 2014-05-11 DIAGNOSIS — F3189 Other bipolar disorder: Secondary | ICD-10-CM | POA: Diagnosis not present

## 2014-05-11 MED ORDER — LISINOPRIL-HYDROCHLOROTHIAZIDE 10-12.5 MG PO TABS: ORAL_TABLET | ORAL | Status: DC

## 2014-05-11 NOTE — Telephone Encounter (Signed)
Caller name: Zivah Relation to pt: self Call back number: [phone]  Or [phone] Pharmacy: [facility]  Reason for call:   Pt would like a refill on RX lisinopril-hydrochlorothiazide (PRINZIDE,ZESTORETIC) 10-12.5 MG   (3 month supply)

## 2014-05-11 NOTE — Telephone Encounter (Signed)
Med filled and pt notified.  

## 2014-06-08 DIAGNOSIS — F3189 Other bipolar disorder: Secondary | ICD-10-CM | POA: Diagnosis not present

## 2014-06-30 DIAGNOSIS — H35379 Puckering of macula, unspecified eye: Secondary | ICD-10-CM | POA: Diagnosis not present

## 2014-07-15 ENCOUNTER — Ambulatory Visit (INDEPENDENT_AMBULATORY_CARE_PROVIDER_SITE_OTHER): Payer: Medicare Other | Admitting: Family Medicine

## 2014-07-15 ENCOUNTER — Encounter: Payer: Medicare Other | Admitting: Family Medicine

## 2014-07-15 ENCOUNTER — Other Ambulatory Visit: Payer: Self-pay | Admitting: General Practice

## 2014-07-15 ENCOUNTER — Encounter: Payer: Self-pay | Admitting: Family Medicine

## 2014-07-15 VITALS — BP 112/74 | HR 77 | Temp 97.9°F | Resp 16 | Ht 61.25 in | Wt 196.2 lb

## 2014-07-15 DIAGNOSIS — Z0000 Encounter for general adult medical examination without abnormal findings: Secondary | ICD-10-CM | POA: Diagnosis not present

## 2014-07-15 DIAGNOSIS — M20009 Unspecified deformity of unspecified finger(s): Secondary | ICD-10-CM

## 2014-07-15 DIAGNOSIS — Z1231 Encounter for screening mammogram for malignant neoplasm of breast: Secondary | ICD-10-CM

## 2014-07-15 DIAGNOSIS — Z780 Asymptomatic menopausal state: Secondary | ICD-10-CM

## 2014-07-15 DIAGNOSIS — I10 Essential (primary) hypertension: Secondary | ICD-10-CM

## 2014-07-15 DIAGNOSIS — Z23 Encounter for immunization: Secondary | ICD-10-CM | POA: Diagnosis not present

## 2014-07-15 DIAGNOSIS — E669 Obesity, unspecified: Secondary | ICD-10-CM

## 2014-07-15 DIAGNOSIS — E785 Hyperlipidemia, unspecified: Secondary | ICD-10-CM

## 2014-07-15 HISTORY — DX: Unspecified deformity of unspecified finger(s): M20.009

## 2014-07-15 LAB — CBC WITH DIFFERENTIAL/PLATELET
BASOS PCT: 0.4 % (ref 0.0–3.0)
Basophils Absolute: 0 10*3/uL (ref 0.0–0.1)
EOS PCT: 1.8 % (ref 0.0–5.0)
Eosinophils Absolute: 0.1 10*3/uL (ref 0.0–0.7)
HEMATOCRIT: 42 % (ref 36.0–46.0)
HEMOGLOBIN: 14.3 g/dL (ref 12.0–15.0)
LYMPHS ABS: 2.1 10*3/uL (ref 0.7–4.0)
Lymphocytes Relative: 31.2 % (ref 12.0–46.0)
MCHC: 34 g/dL (ref 30.0–36.0)
MCV: 92.7 fl (ref 78.0–100.0)
MONO ABS: 0.6 10*3/uL (ref 0.1–1.0)
Monocytes Relative: 8.3 % (ref 3.0–12.0)
NEUTROS ABS: 3.9 10*3/uL (ref 1.4–7.7)
Neutrophils Relative %: 58.3 % (ref 43.0–77.0)
Platelets: 326 10*3/uL (ref 150.0–400.0)
RBC: 4.53 Mil/uL (ref 3.87–5.11)
RDW: 12.9 % (ref 11.5–15.5)
WBC: 6.7 10*3/uL (ref 4.0–10.5)

## 2014-07-15 LAB — HEPATIC FUNCTION PANEL
ALT: 19 U/L (ref 0–35)
AST: 20 U/L (ref 0–37)
Albumin: 4.2 g/dL (ref 3.5–5.2)
Alkaline Phosphatase: 67 U/L (ref 39–117)
BILIRUBIN TOTAL: 0.5 mg/dL (ref 0.2–1.2)
Bilirubin, Direct: 0 mg/dL (ref 0.0–0.3)
Total Protein: 7.6 g/dL (ref 6.0–8.3)

## 2014-07-15 LAB — BASIC METABOLIC PANEL
BUN: 16 mg/dL (ref 6–23)
CHLORIDE: 101 meq/L (ref 96–112)
CO2: 29 mEq/L (ref 19–32)
CREATININE: 0.8 mg/dL (ref 0.4–1.2)
Calcium: 9.2 mg/dL (ref 8.4–10.5)
GFR: 76.88 mL/min (ref >60.00)
Glucose, Bld: 97 mg/dL (ref 70–99)
Potassium: 3.8 mEq/L (ref 3.5–5.1)
Sodium: 138 mEq/L (ref 135–145)

## 2014-07-15 LAB — LIPID PANEL
CHOL/HDL RATIO: 4
Cholesterol: 274 mg/dL — ABNORMAL HIGH (ref 0–200)
HDL: 69.5 mg/dL (ref >39.00)
LDL Cholesterol: 177 mg/dL — ABNORMAL HIGH (ref 0–99)
NONHDL: 204.5
Triglycerides: 136 mg/dL (ref 0.0–149.0)
VLDL: 27.2 mg/dL (ref 0.0–40.0)

## 2014-07-15 LAB — TSH: TSH: 0.47 u[IU]/mL (ref 0.35–4.50)

## 2014-07-15 MED ORDER — ATORVASTATIN CALCIUM 20 MG PO TABS: 20.0000 mg | ORAL_TABLET | Freq: Every day | ORAL | Status: DC

## 2014-07-15 NOTE — Progress Notes (Signed)
Pre visit review using our clinic review tool, if applicable. No additional management support is needed unless otherwise documented below in the visit note. 

## 2014-07-15 NOTE — Patient Instructions (Signed)
Follow up in 6 months to recheck BP We'll notify you of your lab results and make any changes if needed We'll call you with your bone density and mammo appts Try and get at least 20 minutes of exercise for 3 or more times/week We'll call you with your hand appt Call with any questions or concerns Happy Labor Day!!

## 2014-07-15 NOTE — Progress Notes (Signed)
   Subjective:    Patient ID: Megan Duncan, female    DOB: 1946/09/01, 68 y.o.   MRN: [id number]  HPI Here today for CPE.  Risk Factors: HTN- chronic problem, on Lisinopril HCTZ daily w/ good control. Index finger deformity- bilateral, pt is having knotting and deviation of DIP joints Obesity- chronic problem for pt.  Not exercising or making healthy food choices.  Pt is aware that she complains about her weight but takes no steps to improve it. Physical Activity: no formal exercise Fall Risk: low risk Depression: chronic problem, pt has hx of bipolar, following w/ Dr Ebbie Latus PA.  Pt recently stopped [place] and started [employer]: normal to conversational tones, decreased to whispered voice at 6 ft ADL's: independent Cognitive: normal linear thought process, memory and attention intact Home Safety: safe at home Height, Weight, BMI, Visual Acuity: see vitals, vision corrected to 20/20 w/ glasses Counseling: UTD on colonoscopy, shingles vaccine.  Due for DEXA and mammo [facility]).  No need for paps.  Due for pneumovax Labs Ordered: See A&P Care Plan: See A&P    Review of Systems Patient reports no vision/hearing changes, adenopathy,fever, weight change,  persistant/recurrent hoarseness , swallowing issues, chest pain, palpitations, edema, persistant/recurrent cough, hemoptysis, dyspnea (rest/exertional/paroxysmal nocturnal), gastrointestinal bleeding (melena, rectal bleeding), abdominal pain, significant heartburn, bowel changes, GU symptoms (dysuria, hematuria, incontinence), Gyn symptoms (abnormal  bleeding, pain),  syncope, focal weakness, memory loss, numbness & tingling, skin/hair/nail changes, abnormal bruising or bleeding.     Objective:   Physical Exam  General Appearance:    Alert, cooperative, no distress, appears stated age  Head:    Normocephalic, without obvious abnormality, atraumatic  Eyes:    PERRL, conjunctiva/corneas clear, EOM's intact, fundi   benign, both eyes  Ears:    Normal TM's and external ear canals, both ears  Nose:   Nares normal, septum midline, mucosa normal, no drainage    or sinus tenderness  Throat:   Lips, mucosa, and tongue normal; teeth and gums normal  Neck:   Supple, symmetrical, trachea midline, no adenopathy;    Thyroid: no enlargement/tenderness/nodules  Back:     Symmetric, no curvature, ROM normal, no CVA tenderness  Lungs:     Clear to auscultation bilaterally, respirations unlabored  Chest Wall:    No tenderness or deformity   Heart:    Regular rate and rhythm, S1 and S2 normal, no murmur, rub   or gallop  Breast Exam:    No tenderness, masses, or nipple abnormality  Abdomen:     Soft, non-tender, bowel sounds active all four quadrants,    no masses, no organomegaly  Genitalia:    Deferred at pt's request  Rectal:    Extremities:   Extremities normal, atraumatic, no cyanosis or edema  Pulses:   2+ and symmetric all extremities  Skin:   Skin color, texture, turgor normal, no rashes or lesions  Lymph nodes:   Cervical, supraclavicular, and axillary nodes normal  Neurologic:   CNII-XII intact, normal strength, sensation and reflexes    throughout          Assessment & Plan:

## 2014-07-21 DIAGNOSIS — F3189 Other bipolar disorder: Secondary | ICD-10-CM | POA: Diagnosis not present

## 2014-07-21 NOTE — Assessment & Plan Note (Signed)
New.  At this point, it is more cosmetic than pain related concern.  Pt denies pain but is concerned about possible progression of deformity of index fingers bilaterally.  Referral to hand specialist entered.

## 2014-07-21 NOTE — Assessment & Plan Note (Signed)
Chronic problem.  Pt admits to poor dietary choices and no exercise.  Discussed importance of both.  Will follow.

## 2014-07-21 NOTE — Assessment & Plan Note (Signed)
Pt's PE WNL w/ exception of obesity.  Due for DEXA and mammo- orders entered.  UTD on colonoscopy.  Pneumonia vaccine updated.  Check labs.  Anticipatory guidance provided.

## 2014-07-21 NOTE — Assessment & Plan Note (Signed)
Chronic problem.  Adequate control.  Asymptomatic.  Check labs.  No anticipated med changes 

## 2014-08-10 ENCOUNTER — Other Ambulatory Visit: Payer: Medicare Other

## 2014-08-10 ENCOUNTER — Ambulatory Visit: Payer: Medicare Other

## 2014-08-13 DIAGNOSIS — M19041 Primary osteoarthritis, right hand: Secondary | ICD-10-CM | POA: Diagnosis not present

## 2014-08-13 DIAGNOSIS — M19042 Primary osteoarthritis, left hand: Secondary | ICD-10-CM | POA: Diagnosis not present

## 2014-09-01 DIAGNOSIS — F3181 Bipolar II disorder: Secondary | ICD-10-CM | POA: Diagnosis not present

## 2014-09-02 ENCOUNTER — Encounter: Payer: Self-pay | Admitting: Family Medicine

## 2014-09-02 ENCOUNTER — Ambulatory Visit (INDEPENDENT_AMBULATORY_CARE_PROVIDER_SITE_OTHER): Payer: Medicare Other | Admitting: Family Medicine

## 2014-09-02 VITALS — BP 138/84 | HR 104 | Temp 98.1°F | Resp 16 | Wt 194.1 lb

## 2014-09-02 DIAGNOSIS — M25552 Pain in left hip: Secondary | ICD-10-CM

## 2014-09-02 DIAGNOSIS — M25551 Pain in right hip: Secondary | ICD-10-CM | POA: Diagnosis not present

## 2014-09-02 HISTORY — DX: Pain in left hip: M25.552

## 2014-09-02 HISTORY — DX: Pain in right hip: M25.551

## 2014-09-02 MED ORDER — MELOXICAM 15 MG PO TABS: 15.0000 mg | ORAL_TABLET | Freq: Every day | ORAL | Status: DC

## 2014-09-02 NOTE — Assessment & Plan Note (Signed)
New.  Pt's pain is combination of low back pain, SI joint dysfunction, sciatica, and tight hamstrings.  Start daily NSAIDs.  Refer to PT for exercise/strengthening program.  Will continue to follow closely.

## 2014-09-02 NOTE — Patient Instructions (Signed)
Follow up as needed STOP the ibuprofen/aleve START the Mobic daily- take w/ food HEAT! We'll call you with your PT appt Call with any questions or concerns Hang in there!  You'll get there!!!

## 2014-09-02 NOTE — Progress Notes (Signed)
   Subjective:    Patient ID: Megan Duncan, female    DOB: 1946/03/23, 68 y.o.   MRN: [id number]  HPI Hip pain- bilateral, 'my sciatic nerve'.  Pt reports 'sitting for long periods of time for a number of years'.  Now having difficulty walking for any period of time.  'i am so weak in my legs'.  Particular difficulty w/ steps.  Pain described as a 'pulling' in the back of her legs.  Pt is interested in seeing PT to build strength and stamina.  Some weakness of L leg when compared to R.  No bowel or bladder incontinence.   Review of Systems For ROS see HPI     Objective:   Physical Exam  Vitals reviewed. Constitutional: She is oriented to person, place, and time. She appears well-developed and well-nourished. No distress.  Cardiovascular: Intact distal pulses.   Musculoskeletal: She exhibits tenderness (over SI joints, sciatic notch, and hamstrings bilaterally).  Neurological: She is alert and oriented to person, place, and time.  (-) SLR bilaterally  Psychiatric:  Tearful, anxious          Assessment & Plan:

## 2014-09-07 ENCOUNTER — Ambulatory Visit: Payer: Medicare Other

## 2014-09-07 ENCOUNTER — Other Ambulatory Visit: Payer: Medicare Other

## 2014-09-08 ENCOUNTER — Encounter: Payer: Self-pay | Admitting: General Practice

## 2014-09-08 ENCOUNTER — Ambulatory Visit: Payer: Medicare Other | Attending: Family Medicine

## 2014-09-08 ENCOUNTER — Other Ambulatory Visit (INDEPENDENT_AMBULATORY_CARE_PROVIDER_SITE_OTHER): Payer: Medicare Other

## 2014-09-08 DIAGNOSIS — M6281 Muscle weakness (generalized): Secondary | ICD-10-CM | POA: Insufficient documentation

## 2014-09-08 DIAGNOSIS — I10 Essential (primary) hypertension: Secondary | ICD-10-CM | POA: Diagnosis not present

## 2014-09-08 DIAGNOSIS — M545 Low back pain: Secondary | ICD-10-CM | POA: Diagnosis not present

## 2014-09-08 DIAGNOSIS — E785 Hyperlipidemia, unspecified: Secondary | ICD-10-CM | POA: Diagnosis not present

## 2014-09-08 DIAGNOSIS — R262 Difficulty in walking, not elsewhere classified: Secondary | ICD-10-CM | POA: Insufficient documentation

## 2014-09-08 DIAGNOSIS — Z5189 Encounter for other specified aftercare: Secondary | ICD-10-CM | POA: Diagnosis not present

## 2014-09-08 DIAGNOSIS — J45909 Unspecified asthma, uncomplicated: Secondary | ICD-10-CM | POA: Diagnosis not present

## 2014-09-08 LAB — HEPATIC FUNCTION PANEL
ALBUMIN: 3.5 g/dL (ref 3.5–5.2)
ALK PHOS: 60 U/L (ref 39–117)
ALT: 19 U/L (ref 0–35)
AST: 25 U/L (ref 0–37)
BILIRUBIN DIRECT: 0 mg/dL (ref 0.0–0.3)
Total Bilirubin: 0.5 mg/dL (ref 0.2–1.2)
Total Protein: 7.2 g/dL (ref 6.0–8.3)

## 2014-09-09 ENCOUNTER — Other Ambulatory Visit: Payer: Medicare Other

## 2014-09-15 ENCOUNTER — Ambulatory Visit: Payer: Medicare Other | Attending: Family Medicine

## 2014-09-15 DIAGNOSIS — M545 Low back pain: Secondary | ICD-10-CM | POA: Insufficient documentation

## 2014-09-15 DIAGNOSIS — Z5189 Encounter for other specified aftercare: Secondary | ICD-10-CM | POA: Diagnosis not present

## 2014-09-15 DIAGNOSIS — I10 Essential (primary) hypertension: Secondary | ICD-10-CM | POA: Insufficient documentation

## 2014-09-15 DIAGNOSIS — J45909 Unspecified asthma, uncomplicated: Secondary | ICD-10-CM | POA: Insufficient documentation

## 2014-09-15 DIAGNOSIS — M6281 Muscle weakness (generalized): Secondary | ICD-10-CM | POA: Insufficient documentation

## 2014-09-15 DIAGNOSIS — R262 Difficulty in walking, not elsewhere classified: Secondary | ICD-10-CM | POA: Insufficient documentation

## 2014-09-17 ENCOUNTER — Telehealth: Payer: Self-pay

## 2014-09-17 NOTE — Telephone Encounter (Signed)
Dalya Maselli [phone]  [name] would like to ask some questions about her recent lab results.

## 2014-09-17 NOTE — Telephone Encounter (Signed)
Pt notified of latest results.

## 2014-09-20 ENCOUNTER — Ambulatory Visit: Payer: Medicare Other | Admitting: Rehabilitation

## 2014-09-21 ENCOUNTER — Ambulatory Visit: Payer: Medicare Other

## 2014-09-22 ENCOUNTER — Ambulatory Visit: Payer: Medicare Other

## 2014-09-23 ENCOUNTER — Ambulatory Visit: Payer: Medicare Other | Admitting: Rehabilitation

## 2014-09-23 DIAGNOSIS — M545 Low back pain: Secondary | ICD-10-CM | POA: Diagnosis not present

## 2014-09-23 DIAGNOSIS — I10 Essential (primary) hypertension: Secondary | ICD-10-CM | POA: Diagnosis not present

## 2014-09-23 DIAGNOSIS — M6281 Muscle weakness (generalized): Secondary | ICD-10-CM | POA: Diagnosis not present

## 2014-09-23 DIAGNOSIS — Z5189 Encounter for other specified aftercare: Secondary | ICD-10-CM | POA: Diagnosis not present

## 2014-09-23 DIAGNOSIS — J45909 Unspecified asthma, uncomplicated: Secondary | ICD-10-CM | POA: Diagnosis not present

## 2014-09-23 DIAGNOSIS — R262 Difficulty in walking, not elsewhere classified: Secondary | ICD-10-CM | POA: Diagnosis not present

## 2014-09-24 ENCOUNTER — Ambulatory Visit: Payer: Medicare Other | Admitting: Rehabilitation

## 2014-09-24 DIAGNOSIS — R262 Difficulty in walking, not elsewhere classified: Secondary | ICD-10-CM | POA: Diagnosis not present

## 2014-09-24 DIAGNOSIS — M545 Low back pain: Secondary | ICD-10-CM | POA: Diagnosis not present

## 2014-09-24 DIAGNOSIS — M6281 Muscle weakness (generalized): Secondary | ICD-10-CM | POA: Diagnosis not present

## 2014-09-24 DIAGNOSIS — I10 Essential (primary) hypertension: Secondary | ICD-10-CM | POA: Diagnosis not present

## 2014-09-24 DIAGNOSIS — J45909 Unspecified asthma, uncomplicated: Secondary | ICD-10-CM | POA: Diagnosis not present

## 2014-09-24 DIAGNOSIS — Z5189 Encounter for other specified aftercare: Secondary | ICD-10-CM | POA: Diagnosis not present

## 2014-09-27 ENCOUNTER — Ambulatory Visit: Payer: Medicare Other

## 2014-09-27 DIAGNOSIS — M6281 Muscle weakness (generalized): Secondary | ICD-10-CM | POA: Diagnosis not present

## 2014-09-27 DIAGNOSIS — M545 Low back pain: Secondary | ICD-10-CM | POA: Diagnosis not present

## 2014-09-27 DIAGNOSIS — I10 Essential (primary) hypertension: Secondary | ICD-10-CM | POA: Diagnosis not present

## 2014-09-27 DIAGNOSIS — J45909 Unspecified asthma, uncomplicated: Secondary | ICD-10-CM | POA: Diagnosis not present

## 2014-09-27 DIAGNOSIS — Z5189 Encounter for other specified aftercare: Secondary | ICD-10-CM | POA: Diagnosis not present

## 2014-09-27 DIAGNOSIS — R262 Difficulty in walking, not elsewhere classified: Secondary | ICD-10-CM | POA: Diagnosis not present

## 2014-09-29 ENCOUNTER — Ambulatory Visit: Payer: Medicare Other | Admitting: Rehabilitation

## 2014-10-01 ENCOUNTER — Ambulatory Visit: Payer: Medicare Other | Admitting: Rehabilitation

## 2014-10-01 DIAGNOSIS — M6281 Muscle weakness (generalized): Secondary | ICD-10-CM | POA: Diagnosis not present

## 2014-10-01 DIAGNOSIS — M545 Low back pain: Secondary | ICD-10-CM | POA: Diagnosis not present

## 2014-10-01 DIAGNOSIS — Z5189 Encounter for other specified aftercare: Secondary | ICD-10-CM | POA: Diagnosis not present

## 2014-10-01 DIAGNOSIS — I10 Essential (primary) hypertension: Secondary | ICD-10-CM | POA: Diagnosis not present

## 2014-10-01 DIAGNOSIS — R262 Difficulty in walking, not elsewhere classified: Secondary | ICD-10-CM | POA: Diagnosis not present

## 2014-10-01 DIAGNOSIS — J45909 Unspecified asthma, uncomplicated: Secondary | ICD-10-CM | POA: Diagnosis not present

## 2014-10-04 ENCOUNTER — Ambulatory Visit: Payer: Medicare Other | Admitting: Rehabilitation

## 2014-10-04 DIAGNOSIS — I10 Essential (primary) hypertension: Secondary | ICD-10-CM | POA: Diagnosis not present

## 2014-10-04 DIAGNOSIS — M6281 Muscle weakness (generalized): Secondary | ICD-10-CM | POA: Diagnosis not present

## 2014-10-04 DIAGNOSIS — Z5189 Encounter for other specified aftercare: Secondary | ICD-10-CM | POA: Diagnosis not present

## 2014-10-04 DIAGNOSIS — M545 Low back pain: Secondary | ICD-10-CM | POA: Diagnosis not present

## 2014-10-04 DIAGNOSIS — J45909 Unspecified asthma, uncomplicated: Secondary | ICD-10-CM | POA: Diagnosis not present

## 2014-10-04 DIAGNOSIS — R262 Difficulty in walking, not elsewhere classified: Secondary | ICD-10-CM | POA: Diagnosis not present

## 2014-10-06 ENCOUNTER — Ambulatory Visit: Payer: Medicare Other | Admitting: Rehabilitation

## 2014-10-06 DIAGNOSIS — R262 Difficulty in walking, not elsewhere classified: Secondary | ICD-10-CM | POA: Diagnosis not present

## 2014-10-06 DIAGNOSIS — J45909 Unspecified asthma, uncomplicated: Secondary | ICD-10-CM | POA: Diagnosis not present

## 2014-10-06 DIAGNOSIS — M545 Low back pain: Secondary | ICD-10-CM | POA: Diagnosis not present

## 2014-10-06 DIAGNOSIS — I10 Essential (primary) hypertension: Secondary | ICD-10-CM | POA: Diagnosis not present

## 2014-10-06 DIAGNOSIS — M6281 Muscle weakness (generalized): Secondary | ICD-10-CM | POA: Diagnosis not present

## 2014-10-06 DIAGNOSIS — Z5189 Encounter for other specified aftercare: Secondary | ICD-10-CM | POA: Diagnosis not present

## 2014-10-11 ENCOUNTER — Ambulatory Visit: Payer: Medicare Other | Admitting: Rehabilitation

## 2014-10-11 DIAGNOSIS — M6281 Muscle weakness (generalized): Secondary | ICD-10-CM | POA: Diagnosis not present

## 2014-10-11 DIAGNOSIS — Z5189 Encounter for other specified aftercare: Secondary | ICD-10-CM | POA: Diagnosis not present

## 2014-10-11 DIAGNOSIS — R262 Difficulty in walking, not elsewhere classified: Secondary | ICD-10-CM | POA: Diagnosis not present

## 2014-10-11 DIAGNOSIS — I10 Essential (primary) hypertension: Secondary | ICD-10-CM | POA: Diagnosis not present

## 2014-10-11 DIAGNOSIS — M545 Low back pain: Secondary | ICD-10-CM | POA: Diagnosis not present

## 2014-10-11 DIAGNOSIS — J45909 Unspecified asthma, uncomplicated: Secondary | ICD-10-CM | POA: Diagnosis not present

## 2014-10-13 ENCOUNTER — Ambulatory Visit (INDEPENDENT_AMBULATORY_CARE_PROVIDER_SITE_OTHER): Payer: Medicare Other | Admitting: Family Medicine

## 2014-10-13 ENCOUNTER — Encounter: Payer: Self-pay | Admitting: Family Medicine

## 2014-10-13 VITALS — BP 122/80 | HR 77 | Temp 98.0°F | Resp 16 | Wt 195.1 lb

## 2014-10-13 DIAGNOSIS — M797 Fibromyalgia: Secondary | ICD-10-CM | POA: Diagnosis not present

## 2014-10-13 DIAGNOSIS — F3181 Bipolar II disorder: Secondary | ICD-10-CM | POA: Diagnosis not present

## 2014-10-13 DIAGNOSIS — F3132 Bipolar disorder, current episode depressed, moderate: Secondary | ICD-10-CM | POA: Diagnosis not present

## 2014-10-13 MED ORDER — DULOXETINE HCL 20 MG PO CPEP: 20.0000 mg | ORAL_CAPSULE | Freq: Every day | ORAL | Status: DC

## 2014-10-13 NOTE — Progress Notes (Signed)
Pre visit review using our clinic review tool, if applicable. No additional management support is needed unless otherwise documented below in the visit note. 

## 2014-10-13 NOTE — Patient Instructions (Signed)
Schedule a lab visit for tomorrow morning for the Tegretol level Follow up with me in 1 month to recheck mood and fibromyalgia pain Comer Locket states that you should call him if you note any rapid mood swings on the Cymbalta Start Cymbalta 1 tab daily- this is the lowest dose Call with any questions or concerns Happy Holidays!!!

## 2014-10-13 NOTE — Progress Notes (Signed)
   Subjective:    Patient ID: Megan Duncan, female    DOB: 07-Apr-1946, 68 y.o.   MRN: [id number]  HPI Pain- pt is currently in PT for endurance and strength training.  PT sent note questioning whether pt has fibromyalgia due to very sensitive spots on body.  Pt has hx of depression and poor sleep.  Pt was previously on Cymbalta but remembers there being 'some reason we took me off it' but doesn't know what the reason was.  Bipolar d/o- pt had appt w/ Comer Locket and he is asking for Tegretol level in the AM.  No longer on [employer].   Review of Systems For ROS see HPI     Objective:   Physical Exam  Constitutional: She is oriented to person, place, and time. She appears well-developed and well-nourished.  HENT:  Head: Normocephalic and atraumatic.  Musculoskeletal: She exhibits no edema.  Multiple + trigger points  Neurological: She is alert and oriented to person, place, and time. She has normal reflexes. No cranial nerve deficit. Coordination normal.  Psychiatric:  Tearful, anxious  Vitals reviewed.         Assessment & Plan:

## 2014-10-14 ENCOUNTER — Ambulatory Visit: Payer: Medicare Other | Attending: Family Medicine | Admitting: Rehabilitation

## 2014-10-14 ENCOUNTER — Other Ambulatory Visit: Payer: Medicare Other

## 2014-10-14 DIAGNOSIS — R262 Difficulty in walking, not elsewhere classified: Secondary | ICD-10-CM | POA: Insufficient documentation

## 2014-10-14 DIAGNOSIS — M6281 Muscle weakness (generalized): Secondary | ICD-10-CM | POA: Diagnosis not present

## 2014-10-14 DIAGNOSIS — M545 Low back pain: Secondary | ICD-10-CM | POA: Diagnosis not present

## 2014-10-14 DIAGNOSIS — J45909 Unspecified asthma, uncomplicated: Secondary | ICD-10-CM | POA: Insufficient documentation

## 2014-10-14 DIAGNOSIS — Z5189 Encounter for other specified aftercare: Secondary | ICD-10-CM | POA: Insufficient documentation

## 2014-10-14 DIAGNOSIS — M797 Fibromyalgia: Secondary | ICD-10-CM | POA: Insufficient documentation

## 2014-10-14 DIAGNOSIS — I10 Essential (primary) hypertension: Secondary | ICD-10-CM | POA: Insufficient documentation

## 2014-10-14 NOTE — Assessment & Plan Note (Signed)
Chronic problem.  Pt is following w/ psych but admits that depression is not adequately treated.  Called and spoke w/ PA about starting low dose cymbalta to better treat her depression and also provide relief from fibromyalgia pain.  Pt to get tegretol level in AM.  Will follow closely.

## 2014-10-14 NOTE — Assessment & Plan Note (Signed)
New.  Agree w/ PT that pt's pain, depression, and poor sleep are all indicative of fibromyalgia.  Pt's + trigger points further support dx.  Will start low dose Cymbalta to tx both pain and depression after discussing tx w/ psych PA due to hx of bipolar.  Cautioned pt on signs of mania.  Will follow.

## 2014-10-18 ENCOUNTER — Ambulatory Visit: Payer: Medicare Other | Admitting: Rehabilitation

## 2014-10-21 ENCOUNTER — Ambulatory Visit: Payer: Medicare Other | Admitting: Rehabilitation

## 2014-10-26 ENCOUNTER — Ambulatory Visit: Payer: Medicare Other | Admitting: Rehabilitation

## 2014-10-26 DIAGNOSIS — Z5189 Encounter for other specified aftercare: Secondary | ICD-10-CM | POA: Diagnosis not present

## 2014-10-28 ENCOUNTER — Ambulatory Visit: Payer: Medicare Other | Admitting: Rehabilitation

## 2014-11-01 ENCOUNTER — Ambulatory Visit: Payer: Medicare Other | Admitting: Rehabilitation

## 2014-11-01 DIAGNOSIS — Z5189 Encounter for other specified aftercare: Secondary | ICD-10-CM | POA: Diagnosis not present

## 2014-11-08 ENCOUNTER — Ambulatory Visit: Payer: Medicare Other | Admitting: Rehabilitation

## 2014-11-11 ENCOUNTER — Ambulatory Visit: Payer: Medicare Other | Admitting: Rehabilitation

## 2014-11-11 DIAGNOSIS — Z5189 Encounter for other specified aftercare: Secondary | ICD-10-CM | POA: Diagnosis not present

## 2014-11-15 ENCOUNTER — Ambulatory Visit (INDEPENDENT_AMBULATORY_CARE_PROVIDER_SITE_OTHER): Payer: Medicare Other | Admitting: Family Medicine

## 2014-11-15 ENCOUNTER — Encounter: Payer: Self-pay | Admitting: Family Medicine

## 2014-11-15 VITALS — BP 120/80 | HR 81 | Temp 98.1°F | Resp 16 | Wt 191.1 lb

## 2014-11-15 DIAGNOSIS — F3132 Bipolar disorder, current episode depressed, moderate: Secondary | ICD-10-CM | POA: Diagnosis not present

## 2014-11-15 DIAGNOSIS — M5417 Radiculopathy, lumbosacral region: Secondary | ICD-10-CM

## 2014-11-15 DIAGNOSIS — M5416 Radiculopathy, lumbar region: Secondary | ICD-10-CM

## 2014-11-15 NOTE — Progress Notes (Signed)
   Subjective:    Patient ID: Megan Duncan, female    DOB: [birth year], 69 y.o.   MRN: [id number]  HPI Depression- chronic problem, improved since starting Cymbalta.  Mood is better, not crying as frequently.  Has lost 4-5 lbs since last visit.  No sxs of mania.  Back pain- chronic problem, currently in PT.  Therapist is concerned for possible spinal stenosis.  Reviewed MRI from 2011 which shows mild stenosis and eccentric L disc bulge at L4-5.  Pt reports pain across buttock.   Review of Systems For ROS see HPI     Objective:   Physical Exam  Constitutional: She is oriented to person, place, and time. She appears well-developed and well-nourished. No distress.  HENT:  Head: Normocephalic and atraumatic.  Neck: Normal range of motion. Neck supple.  Lymphadenopathy:    She has no cervical adenopathy.  Neurological: She is alert and oriented to person, place, and time.  Skin: Skin is warm and dry.  Psychiatric: She has a normal mood and affect. Her behavior is normal. Thought content normal.  Vitals reviewed.         Assessment & Plan:

## 2014-11-15 NOTE — Progress Notes (Signed)
Pre visit review using our clinic review tool, if applicable. No additional management support is needed unless otherwise documented below in the visit note. 

## 2014-11-15 NOTE — Patient Instructions (Signed)
Follow up as needed We'll call you with your Ortho appt Continue the Cymbalta daily- I'm so glad it's working!!! Call with any questions or concerns Happy New Year!!!

## 2014-11-16 ENCOUNTER — Ambulatory Visit: Payer: Medicare Other | Admitting: Rehabilitation

## 2014-11-17 DIAGNOSIS — M4806 Spinal stenosis, lumbar region: Secondary | ICD-10-CM | POA: Diagnosis not present

## 2014-11-17 DIAGNOSIS — M25551 Pain in right hip: Secondary | ICD-10-CM | POA: Diagnosis not present

## 2014-11-17 DIAGNOSIS — M25552 Pain in left hip: Secondary | ICD-10-CM | POA: Diagnosis not present

## 2014-11-17 DIAGNOSIS — M545 Low back pain: Secondary | ICD-10-CM | POA: Diagnosis not present

## 2014-11-19 ENCOUNTER — Encounter: Payer: Medicare Other | Admitting: Physical Therapy

## 2014-11-21 NOTE — Assessment & Plan Note (Signed)
Pt is doing VERY well on Cymbalta.  Reports this is the best she has felt in quite some time.  Others have noticed the change as well.  No sxs of mania since starting meds.  No med changes.  Will continue to follow.

## 2014-11-21 NOTE — Assessment & Plan Note (Signed)
Pt has hx of similar.  Previous MRI shows bulging disc.  Since this was years ago and pt is having worsening sxs, will refer to Ortho for possible updated MRI and tx plan.  Pt expressed understanding and is in agreement w/ plan.

## 2014-11-24 ENCOUNTER — Encounter: Payer: Medicare Other | Admitting: Rehabilitation

## 2014-11-24 ENCOUNTER — Telehealth: Payer: Self-pay | Admitting: Family Medicine

## 2014-11-24 NOTE — Telephone Encounter (Signed)
Please advise if there is a medication you would recommend or should she remain on the cortisone?

## 2014-11-24 NOTE — Telephone Encounter (Signed)
Caller name: Braylen, Denunzio Relationship to patient: self  Can be reached: [phone]   Reason for call: pt wanted to advise MD she was seen by the orthopedic surgeon and they scheduled pt to have a cortisone injection. Pt canceled injection appointment and in need of clinical advice regarding taking another route suggesting another anti inflammatory RX.

## 2014-11-24 NOTE — Telephone Encounter (Signed)
Pt notified and she stated that she is going to hold off until she feels worse.

## 2014-11-24 NOTE — Telephone Encounter (Signed)
I saw the note from Ortho who recommended an epidural steroid injection- I do agree w/ this as the next step and think she will get pain relief from it

## 2014-11-26 ENCOUNTER — Encounter: Payer: Medicare Other | Admitting: Physical Therapy

## 2014-11-30 ENCOUNTER — Encounter: Payer: Medicare Other | Admitting: Rehabilitation

## 2014-12-02 ENCOUNTER — Encounter: Payer: Medicare Other | Admitting: Rehabilitation

## 2014-12-07 ENCOUNTER — Telehealth: Payer: Self-pay | Admitting: Family Medicine

## 2014-12-07 ENCOUNTER — Encounter: Payer: Medicare Other | Admitting: Rehabilitation

## 2014-12-07 MED ORDER — LISINOPRIL-HYDROCHLOROTHIAZIDE 10-12.5 MG PO TABS: ORAL_TABLET | ORAL | Status: DC

## 2014-12-07 NOTE — Telephone Encounter (Signed)
Med filled.  

## 2014-12-07 NOTE — Telephone Encounter (Signed)
Caller name:Fencl, Dorthea Relationship to patient:Self Can be reached:[phone] Pharmacy:[facility]  Reason for call: PT RX lisininopro - HCTZ called in

## 2014-12-09 ENCOUNTER — Encounter: Payer: Medicare Other | Admitting: Rehabilitation

## 2014-12-13 MED ORDER — LISINOPRIL-HYDROCHLOROTHIAZIDE 10-12.5 MG PO TABS: ORAL_TABLET | ORAL | Status: DC

## 2014-12-13 NOTE — Addendum Note (Signed)
Addended by: Kris Hartmann on: 12/13/2014 12:40 PM   Modules accepted: Orders

## 2014-12-13 NOTE — Telephone Encounter (Signed)
Sorry for any confusion, the local pharmacy was already in the encounter.

## 2014-12-13 NOTE — Telephone Encounter (Signed)
Patient is picking up from the local pharmacy but this should have been sent to [facility].

## 2014-12-14 ENCOUNTER — Ambulatory Visit: Payer: Medicare Other | Admitting: Rehabilitation

## 2015-01-07 ENCOUNTER — Other Ambulatory Visit: Payer: Self-pay | Admitting: Family Medicine

## 2015-01-07 NOTE — Telephone Encounter (Signed)
Duloxetine refilled per protocol. JG//CMA

## 2015-01-13 ENCOUNTER — Encounter: Payer: Self-pay | Admitting: Family Medicine

## 2015-01-13 ENCOUNTER — Ambulatory Visit (INDEPENDENT_AMBULATORY_CARE_PROVIDER_SITE_OTHER): Payer: Medicare Other | Admitting: Family Medicine

## 2015-01-13 ENCOUNTER — Other Ambulatory Visit: Payer: Self-pay | Admitting: Family Medicine

## 2015-01-13 VITALS — BP 120/80 | HR 73 | Temp 98.2°F | Resp 16 | Wt 192.2 lb

## 2015-01-13 DIAGNOSIS — E785 Hyperlipidemia, unspecified: Secondary | ICD-10-CM

## 2015-01-13 DIAGNOSIS — I10 Essential (primary) hypertension: Secondary | ICD-10-CM

## 2015-01-13 DIAGNOSIS — E1169 Type 2 diabetes mellitus with other specified complication: Secondary | ICD-10-CM | POA: Insufficient documentation

## 2015-01-13 DIAGNOSIS — R7989 Other specified abnormal findings of blood chemistry: Secondary | ICD-10-CM

## 2015-01-13 DIAGNOSIS — R945 Abnormal results of liver function studies: Principal | ICD-10-CM

## 2015-01-13 LAB — HEPATIC FUNCTION PANEL
ALT: 108 U/L — AB (ref 0–35)
AST: 53 U/L — AB (ref 0–37)
Albumin: 4.3 g/dL (ref 3.5–5.2)
Alkaline Phosphatase: 115 U/L (ref 39–117)
BILIRUBIN DIRECT: 0.1 mg/dL (ref 0.0–0.3)
BILIRUBIN TOTAL: 0.3 mg/dL (ref 0.2–1.2)
Total Protein: 7.4 g/dL (ref 6.0–8.3)

## 2015-01-13 LAB — CBC WITH DIFFERENTIAL/PLATELET
BASOS ABS: 0 10*3/uL (ref 0.0–0.1)
Basophils Relative: 0.6 % (ref 0.0–3.0)
Eosinophils Absolute: 0.1 10*3/uL (ref 0.0–0.7)
Eosinophils Relative: 1.5 % (ref 0.0–5.0)
HCT: 40.7 % (ref 36.0–46.0)
Hemoglobin: 13.8 g/dL (ref 12.0–15.0)
LYMPHS ABS: 1.7 10*3/uL (ref 0.7–4.0)
Lymphocytes Relative: 27.7 % (ref 12.0–46.0)
MCHC: 34 g/dL (ref 30.0–36.0)
MCV: 92.7 fl (ref 78.0–100.0)
MONO ABS: 0.6 10*3/uL (ref 0.1–1.0)
MONOS PCT: 9.4 % (ref 3.0–12.0)
Neutro Abs: 3.8 10*3/uL (ref 1.4–7.7)
Neutrophils Relative %: 60.8 % (ref 43.0–77.0)
PLATELETS: 320 10*3/uL (ref 150.0–400.0)
RBC: 4.39 Mil/uL (ref 3.87–5.11)
RDW: 13.6 % (ref 11.5–15.5)
WBC: 6.3 10*3/uL (ref 4.0–10.5)

## 2015-01-13 LAB — BASIC METABOLIC PANEL
BUN: 13 mg/dL (ref 6–23)
CHLORIDE: 104 meq/L (ref 96–112)
CO2: 31 mEq/L (ref 19–32)
Calcium: 9.4 mg/dL (ref 8.4–10.5)
Creatinine, Ser: 0.72 mg/dL (ref 0.40–1.20)
GFR: 85.44 mL/min (ref >60.00)
Glucose, Bld: 92 mg/dL (ref 70–99)
POTASSIUM: 3.8 meq/L (ref 3.5–5.1)
Sodium: 138 mEq/L (ref 135–145)

## 2015-01-13 LAB — LIPID PANEL
CHOLESTEROL: 202 mg/dL — AB (ref 0–200)
HDL: 65.6 mg/dL (ref >39.00)
LDL Cholesterol: 108 mg/dL — ABNORMAL HIGH (ref 0–99)
NonHDL: 136.4
Total CHOL/HDL Ratio: 3
Triglycerides: 141 mg/dL (ref 0.0–149.0)
VLDL: 28.2 mg/dL (ref 0.0–40.0)

## 2015-01-13 NOTE — Assessment & Plan Note (Signed)
Chronic problem.  Excellent control.  Asymptomatic.  Check labs.  No anticipated med changes. 

## 2015-01-13 NOTE — Progress Notes (Signed)
   Subjective:    Patient ID: Megan Duncan, female    DOB: 11-Jul-1946, 69 y.o.   MRN: [id number]  HPI HTN- chronic problem, on Lisinopril HCTZ.  Good med compliance.  Well controlled.  Denies CP, SOB, HAs, visual changes, edema.  Hyperlipidemia- chronic problem, on Lipitor.  Denies abd pain, N/V, myalgias.   Review of Systems For ROS see HPI     Objective:   Physical Exam  Constitutional: She is oriented to person, place, and time. She appears well-developed and well-nourished. No distress.  HENT:  Head: Normocephalic and atraumatic.  Eyes: Conjunctivae and EOM are normal. Pupils are equal, round, and reactive to light.  Neck: Normal range of motion. Neck supple. No thyromegaly present.  Cardiovascular: Normal rate, regular rhythm, normal heart sounds and intact distal pulses.   No murmur heard. Pulmonary/Chest: Effort normal and breath sounds normal. No respiratory distress.  Abdominal: Soft. She exhibits no distension. There is no tenderness.  Musculoskeletal: She exhibits no edema.  Lymphadenopathy:    She has no cervical adenopathy.  Neurological: She is alert and oriented to person, place, and time.  Skin: Skin is warm and dry.  Psychiatric: She has a normal mood and affect. Her behavior is normal.  Vitals reviewed.         Assessment & Plan:

## 2015-01-13 NOTE — Progress Notes (Signed)
Pre visit review using our clinic review tool, if applicable. No additional management support is needed unless otherwise documented below in the visit note. 

## 2015-01-13 NOTE — Assessment & Plan Note (Signed)
Chronic problem.  Tolerating statin w/o difficulty.  Check labs.  Adjust meds prn  

## 2015-01-13 NOTE — Patient Instructions (Signed)
Schedule your complete physical in 6 months We'll notify you of your lab results and make any changes if needed Keep up the good work on healthy diet and regular exercise Call with any questions or concerns Happy Spring!!!

## 2015-01-14 ENCOUNTER — Telehealth: Payer: Self-pay | Admitting: Family Medicine

## 2015-01-14 DIAGNOSIS — Z5181 Encounter for therapeutic drug level monitoring: Secondary | ICD-10-CM

## 2015-01-14 MED ORDER — NAPROXEN 500 MG PO TABS: 500.0000 mg | ORAL_TABLET | Freq: Two times a day (BID) | ORAL | Status: DC

## 2015-01-14 NOTE — Telephone Encounter (Signed)
Med filled and pt notified, labs ordered.

## 2015-01-14 NOTE — Telephone Encounter (Signed)
Patient called in stating that she wants last labs faxed to [facility]. Labs faxed.

## 2015-01-14 NOTE — Telephone Encounter (Signed)
Caller name: Annahi Relation to pt: self Call back number: [phone] Pharmacy: [facility] on [address]  Reason for call:   Patient spoke to Dr. Layne Benton at [facility]  state that diclofence would cause liver issues and will not prescribe anymore. Option is to have a spinal inj but patient does not want. Patient wants to know what kind of pain medication can she take? meloxicam does not work.

## 2015-01-14 NOTE — Telephone Encounter (Signed)
Ok for Naproxen 500mg  BID #60, 3 refills.  Pt should have lab visit after 1 month to recheck kidney function.  Please take Voltaren off the list and add to allergy/intolerance list

## 2015-01-21 ENCOUNTER — Telehealth: Payer: Self-pay | Admitting: Family Medicine

## 2015-01-21 MED ORDER — ATORVASTATIN CALCIUM 20 MG PO TABS: 20.0000 mg | ORAL_TABLET | Freq: Every day | ORAL | Status: DC

## 2015-01-21 NOTE — Telephone Encounter (Signed)
NEEDS REFILL TO [facility]

## 2015-01-21 NOTE — Telephone Encounter (Signed)
Med filled.  

## 2015-01-27 ENCOUNTER — Other Ambulatory Visit (INDEPENDENT_AMBULATORY_CARE_PROVIDER_SITE_OTHER): Payer: Medicare Other

## 2015-01-27 DIAGNOSIS — R7989 Other specified abnormal findings of blood chemistry: Secondary | ICD-10-CM | POA: Diagnosis not present

## 2015-01-27 DIAGNOSIS — Z5181 Encounter for therapeutic drug level monitoring: Secondary | ICD-10-CM | POA: Diagnosis not present

## 2015-01-27 DIAGNOSIS — R945 Abnormal results of liver function studies: Principal | ICD-10-CM

## 2015-01-27 LAB — BASIC METABOLIC PANEL
BUN: 17 mg/dL (ref 6–23)
CALCIUM: 9 mg/dL (ref 8.4–10.5)
CHLORIDE: 103 meq/L (ref 96–112)
CO2: 29 meq/L (ref 19–32)
CREATININE: 0.75 mg/dL (ref 0.40–1.20)
GFR: 81.5 mL/min (ref >60.00)
GLUCOSE: 140 mg/dL — AB (ref 70–99)
Potassium: 3.6 mEq/L (ref 3.5–5.1)
Sodium: 138 mEq/L (ref 135–145)

## 2015-01-27 LAB — HEPATIC FUNCTION PANEL
ALT: 24 U/L (ref 0–35)
AST: 23 U/L (ref 0–37)
Albumin: 4.1 g/dL (ref 3.5–5.2)
Alkaline Phosphatase: 77 U/L (ref 39–117)
Bilirubin, Direct: 0.1 mg/dL (ref 0.0–0.3)
TOTAL PROTEIN: 6.7 g/dL (ref 6.0–8.3)
Total Bilirubin: 0.4 mg/dL (ref 0.2–1.2)

## 2015-01-28 ENCOUNTER — Other Ambulatory Visit: Payer: Self-pay | Admitting: Family Medicine

## 2015-01-28 DIAGNOSIS — R3989 Other symptoms and signs involving the genitourinary system: Secondary | ICD-10-CM

## 2015-01-31 ENCOUNTER — Other Ambulatory Visit: Payer: Medicare Other

## 2015-02-02 DIAGNOSIS — F3181 Bipolar II disorder: Secondary | ICD-10-CM | POA: Diagnosis not present

## 2015-02-09 ENCOUNTER — Other Ambulatory Visit: Payer: Self-pay | Admitting: Family Medicine

## 2015-02-09 NOTE — Telephone Encounter (Signed)
Med filled.  

## 2015-03-30 DIAGNOSIS — F3181 Bipolar II disorder: Secondary | ICD-10-CM | POA: Diagnosis not present

## 2015-03-31 ENCOUNTER — Telehealth: Payer: Self-pay | Admitting: Family Medicine

## 2015-03-31 DIAGNOSIS — M5416 Radiculopathy, lumbar region: Secondary | ICD-10-CM

## 2015-03-31 NOTE — Telephone Encounter (Signed)
Caller name: [name] Relationship to patient: self Can be reached: [phone]  Reason for call: Pt called and would like to restart PT. She said she stopped going end of 2015/beginning of 2016. She went to ortho and had stopped going to PT. She states she does not want to get spinal injections again if she can avoid it. Please enter referral.

## 2015-03-31 NOTE — Telephone Encounter (Signed)
Referral placed.

## 2015-03-31 NOTE — Telephone Encounter (Signed)
Referral in [facility] PT work que/awaiting appt

## 2015-04-07 ENCOUNTER — Ambulatory Visit: Payer: Medicare Other | Attending: Family Medicine | Admitting: Physical Therapy

## 2015-04-07 DIAGNOSIS — M545 Low back pain: Secondary | ICD-10-CM | POA: Diagnosis not present

## 2015-04-07 NOTE — Therapy (Signed)
[facility] [address]  [place], [place], [address] Phone: [phone]   Fax:  [phone]  Physical Therapy Evaluation  Patient Details  Name: Megan Duncan MRN: [id number] Date of Birth: June 22, 1946 Referring Provider:  Midge Minium, MD  Encounter Date: 04/07/2015      PT End of Session - 04/07/15 0949    Visit Number 1   Number of Visits 12   Date for PT Re-Evaluation 05/19/15   PT Start Time 0938   PT Stop Time 1022   PT Time Calculation (min) 44 min      Past Medical History  Diagnosis Date  . Anxiety   . Depression   . LBP (low back pain)   . Bipolar II disorder     Dr. Charlott Holler  . Arthritis     hands  . Hypertension   . Diverticulosis   . Hyperlipidemia     Past Surgical History  Procedure Laterality Date  . Appendectomy    . Total abdominal hysterectomy    . Tonsillectomy    . Knee arthroscopy  2008    right    There were no vitals filed for this visit.  Visit Diagnosis:  Bilateral low back pain, with sciatica presence unspecified      Subjective Assessment - 04/07/15 0938    Subjective pt here for OPPT eval.  She was seen here late 2015 for same issue with minimal improvement.  Since that time she has been diagnosed with fibromyalgia as expected during prior bout of PT.  She has been told that epidural vs surgery is recommended but she does not want to go with either of these options.  She c/o increased pain over the past 2 weeks for unknown reason with pain noted to B lower back and buttock.   How long can you stand comfortably? 5 minutes   How long can you walk comfortably? 15-20 minutes - unable [place] walk [place] one [place] another at airport   Patient Stated Goals be able to walk 20-30 minutes for exericse to help with wt loss   Currently in Pain? Yes   Pain Score 6   AVG pain 4-5/10, 8/10 worst pain   Pain Location Back   Pain Orientation Right;Left   Pain Radiating  Towards B posterior lower leg to ankles   Pain Frequency Intermittent   Aggravating Factors  prolonged walking, standing, trunk movements   Pain Relieving Factors sitting and lying down   Multiple Pain Sites No            [facility] PT Assessment - 04/07/15 0001    Assessment   Medical Diagnosis LBP   Balance Screen   Has the patient fallen in the past 6 months No   Has the patient had a decrease in activity level because of a fear of falling?  No   Is the patient reluctant to leave their home because of a fear of falling?  No   Prior Function   Vocation Retired   Observation/Other Assessments   Focus on Therapeutic Outcomes (FOTO)  59% limitation   Posture/Postural Control   Posture Comments anterior pelvic tilt and mild hyperlordosis in l-spine   ROM / Strength   AROM / PROM / Strength AROM;Strength   AROM   AROM Assessment Site Lumbar   Lumbar Flexion hands to ankles, pain up return from flexion   Lumbar Extension 50% normal with c/o increased L LE pain  Strength   Overall Strength Comments B LE 4/5 to 4+/5 grossly often with c/o sensitivity with hand positions during testing   Palpation   Palpation comment generalized hypersensitivity throughout lumbopelvic area   Special Tests    Special Tests Lumbar   Lumbar Tests FABER test;Slump Test;Prone Knee Bend Test;Straight Leg Raise   FABER test   findings Negative   Slump test   Findings Negative   Prone Knee Bend Test   Findings Negative   Straight Leg Raise   Findings Negative        TODAY'S TREATMENT TherEx - HEP instruct and perform - See HEP                     PT Short Term Goals - Apr 26, 2015 1024    PT SHORT TERM GOAL #1   Title pt independent with initial HEP by 04/16/15   Status New           PT Long Term Goals - April 26, 2015 1024    PT LONG TERM GOAL #1   Title pt independent with advanced HEP for continued progress by 05/19/15   Status New   PT LONG TERM GOAL #2   Title pt able to stand  20 minutes or greater to allow cooking without limit by pain by 05/19/15   Status New   PT LONG TERM GOAL #3   Title pt able to walk 30 minutes to allow shopping and walking within airport without limitation by pain by 05/19/15   Status New   PT LONG TERM GOAL #4   Title B LE MMT 4+/5 or better grossly by 05/19/15   Status New               Plan - 2015-04-26 1010    Clinical Impression Statement pt with wide variety of lumbopelvic and lumbosacral pain. s/s consistent with lumbopelvic instability/poor core stability.  All special testing NEG for neural impingement today.   Pt will benefit from skilled therapeutic intervention in order to improve on the following deficits Postural dysfunction;Pain;Improper body mechanics;Difficulty walking;Impaired flexibility;Decreased strength   Rehab Potential Good   PT Frequency 2x / week   PT Duration 6 weeks  4 to 6 weeks   PT Treatment/Interventions Therapeutic exercise;Therapeutic activities;Patient/family education;Functional mobility training;ADLs/Self Care Home Management;Electrical Stimulation;Neuromuscular re-education;Manual techniques;Traction;Ultrasound   PT Next Visit Plan stability training as able, possible hooklying mechanical traction (begin 40-50# and progress from there as tolerated)   Consulted and Agree with Plan of Care Patient          G-Codes - 04-26-15 0954    Functional Assessment Tool Used FOTO 59% limitation   Functional Limitation Mobility: Walking and moving around   Mobility: Walking and Moving Around Current Status [phone]) At least 40 percent but less than 60 percent impaired, limited or restricted   Mobility: Walking and Moving Around Goal Status [phone]) At least 20 percent but less than 40 percent impaired, limited or restricted       Problem List Patient Active Problem List   Diagnosis Date Noted  . Hyperlipidemia 01/13/2015  . Fibromyalgia 10/14/2014  . Bilateral hip pain 09/02/2014  . 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 Routine general medical examination at a health care facility 07/15/2014  . Ceruminosis 03/04/2014  . Tinnitus 03/04/2014  . Torticollis 03/04/2014  . Bipolar disorder with moderate depression 12/03/2013  . Sinusitis, acute maxillary 09/14/2013  . Low back pain 08/06/2012  . Lower abdominal pain 08/06/2012  . Physical  exam, annual 06/17/2012  . Abscess 04/11/2011  . URI 09/19/2010  . OTITIS MEDIA 09/05/2010  . OBESITY 07/20/2010  . PERIPHERAL NEUROPATHY 02/22/2010  . Lumbar back pain with radiculopathy affecting left lower extremity 01/24/2010  . ABSCESS, SUPRAPUBIC 09/15/2009  . COCCYGEAL PAIN 12/14/2008  . SKIN LESION 11/24/2008  . NECK PAIN, CHRONIC 06/20/2007  . BACK PAIN, LUMBAR 06/20/2007  . ANXIETY 04/23/2007  . Essential hypertension 04/23/2007  . ENDOMETRITIS 04/23/2007    Tristian Bouska PT, OCs 04/07/2015, 10:55 AM  [facility] [place], [place], [address] Phone: [phone]   Fax:  [phone]

## 2015-04-14 ENCOUNTER — Encounter: Payer: Self-pay | Admitting: Family Medicine

## 2015-04-14 ENCOUNTER — Ambulatory Visit (INDEPENDENT_AMBULATORY_CARE_PROVIDER_SITE_OTHER): Payer: Medicare Other | Admitting: Family Medicine

## 2015-04-14 VITALS — BP 124/84 | HR 74 | Temp 98.1°F | Resp 16 | Wt 195.1 lb

## 2015-04-14 DIAGNOSIS — B029 Zoster without complications: Secondary | ICD-10-CM

## 2015-04-14 HISTORY — DX: Zoster without complications: B02.9

## 2015-04-14 MED ORDER — VALACYCLOVIR HCL 1 G PO TABS: 1000.0000 mg | ORAL_TABLET | Freq: Three times a day (TID) | ORAL | Status: DC

## 2015-04-14 NOTE — Patient Instructions (Signed)
Follow up as needed This is shingles Start the Valtrex 3x/day x7 days Call with any questions or concerns Hang in there!!!

## 2015-04-14 NOTE — Progress Notes (Signed)
   Subjective:    Patient ID: Megan Duncan, female    DOB: 06-Nov-1946, 69 y.o.   MRN: [id number]  HPI L side pain- some numbness of L lower flank.  Also having 'pain like i pulled something'.  Pain 1st started 'a couple of weeks ago'.  No nausea or vomiting.  Pt has chronic L lumbar back pain.  Pt reports back pain feels separate from flank pain.  No burning w/ urination.  Pt reports pain is deep to the skin but not deep to muscle.  Red spot- under L panus fold.  No pain, doesn't itch.  'but it doesn't look good'.   Review of Systems For ROS see HPI     Objective:   Physical Exam  Constitutional: She is oriented to person, place, and time. She appears well-developed and well-nourished. No distress.  HENT:  Head: Normocephalic and atraumatic.  Abdominal: Soft. Bowel sounds are normal. She exhibits no distension. There is tenderness (over L flank). There is guarding (voluntary guarding w/ palpation of L flank). There is no rebound.  Neurological: She is alert and oriented to person, place, and time.  Skin: Skin is warm and dry. Rash (vesicular rash on L lower abdomen consistent w/ shingles) noted. There is erythema.  Psychiatric: She has a normal mood and affect. Her behavior is normal. Thought content normal.  Vitals reviewed.         Assessment & Plan:

## 2015-04-14 NOTE — Assessment & Plan Note (Signed)
New to provider, pt has hx of similar.  Has received Zostavax which is likely why pt's pain is minimal and not consistent w/ her previous episode.  This would explain the numbness of L flank and TTP over nerve distribution.  Start Valtrex.  Reviewed supportive care and red flags that should prompt return.  Pt expressed understanding and is in agreement w/ plan.

## 2015-04-14 NOTE — Progress Notes (Signed)
Pre visit review using our clinic review tool, if applicable. No additional management support is needed unless otherwise documented below in the visit note. 

## 2015-04-15 ENCOUNTER — Ambulatory Visit: Payer: Medicare Other | Admitting: Physical Therapy

## 2015-04-18 ENCOUNTER — Ambulatory Visit: Payer: Medicare Other | Admitting: Rehabilitation

## 2015-04-21 ENCOUNTER — Ambulatory Visit: Payer: Medicare Other | Admitting: Rehabilitation

## 2015-04-25 ENCOUNTER — Ambulatory Visit: Payer: Medicare Other | Attending: Family Medicine | Admitting: Rehabilitation

## 2015-04-25 DIAGNOSIS — M545 Low back pain: Secondary | ICD-10-CM | POA: Diagnosis not present

## 2015-04-25 NOTE — Therapy (Signed)
[facility] [address]  [place], [place], [address] Phone: [phone]   Fax:  [phone]  Physical Therapy Treatment  Patient Details  Name: Megan Duncan MRN: [id number] Date of Birth: 1946-05-16 Referring Provider:  Midge Minium, MD  Encounter Date: 04/25/2015      PT End of Session - 04/25/15 0851    Visit Number 2   Number of Visits 12   Date for PT Re-Evaluation 05/19/15   PT Start Time 0848   PT Stop Time 0926   PT Time Calculation (min) 38 min   Activity Tolerance Patient tolerated treatment well   Behavior During Therapy [facility] for tasks assessed/performed      Past Medical History  Diagnosis Date  . Anxiety   . Depression   . LBP (low back pain)   . Bipolar II disorder     Dr. Charlott Holler  . Arthritis     hands  . Hypertension   . Diverticulosis   . Hyperlipidemia     Past Surgical History  Procedure Laterality Date  . Appendectomy    . Total abdominal hysterectomy    . Tonsillectomy    . Knee arthroscopy  2008    right    There were no vitals filed for this visit.  Visit Diagnosis:  Bilateral low back pain, with sciatica presence unspecified      Subjective Assessment - 04/25/15 0849    Subjective Reports she has been out for the past few weeks due to shingles.    Currently in Pain? Yes   Pain Score 6    Pain Location Back   Pain Orientation Right;Left   Aggravating Factors  Moving/walking, prolonged standing/walking      TherEx- Nustep level 4x5' Hooklying TrA+Hip Extension iso into table 3"x10  Hookyling single Hip Abduction Black TB x10, bil Hooklying single Hip Adduction Black TB x10, bil Hooklying TrA+March x10, bil Stretch bil Hamstring, SKTC, Piriformis  Hooklying TrA+SLR x10, bil (3-4 inch lift) Side-lying Clam ER x15, Bil         PT Short Term Goals - 04/25/15 0854    PT SHORT TERM GOAL #1   Title pt independent with initial HEP by 04/16/15   Status Achieved           PT Long Term Goals - 04/25/15 0854    PT LONG TERM GOAL #1   Title pt independent with advanced HEP for continued progress by 05/19/15   Status On-going   PT LONG TERM GOAL #2   Title pt able to stand 20 minutes or greater to allow cooking without limit by pain by 05/19/15   Status On-going   PT LONG TERM GOAL #3   Title pt able to walk 30 minutes to allow shopping and walking within airport without limitation by pain by 05/19/15   Status On-going   PT LONG TERM GOAL #4   Title B LE MMT 4+/5 or better grossly by 05/19/15   Status On-going               Plan - 04/25/15 0923    Clinical Impression Statement Good tolerance to exercises but pt did have pain with most exercises. She was able to push through and seems very motivated to get better.    PT Next Visit Plan Core stability as able, traction? (40-50# start, hooklying)   Consulted and Agree with Plan of Care Patient        Problem  List Patient Active Problem List   Diagnosis Date Noted  . Shingles outbreak 04/14/2015  . Hyperlipidemia 01/13/2015  . Fibromyalgia 10/14/2014  . Bilateral hip pain 09/02/2014  . 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 Routine general medical examination at a health care facility 07/15/2014  . Ceruminosis 03/04/2014  . Tinnitus 03/04/2014  . Torticollis 03/04/2014  . Bipolar disorder with moderate depression 12/03/2013  . Sinusitis, acute maxillary 09/14/2013  . Low back pain 08/06/2012  . Lower abdominal pain 08/06/2012  . Physical exam, annual 06/17/2012  . Abscess 04/11/2011  . URI 09/19/2010  . OTITIS MEDIA 09/05/2010  . OBESITY 07/20/2010  . PERIPHERAL NEUROPATHY 02/22/2010  . Lumbar back pain with radiculopathy affecting left lower extremity 01/24/2010  . ABSCESS, SUPRAPUBIC 09/15/2009  . COCCYGEAL PAIN 12/14/2008  . SKIN LESION 11/24/2008  . NECK PAIN, CHRONIC 06/20/2007  . BACK PAIN, LUMBAR 06/20/2007  . ANXIETY 04/23/2007  . Essential hypertension  04/23/2007  . ENDOMETRITIS 04/23/2007    Barbette Hair, PTA 04/25/2015, 9:26 AM  [facility] [place], [place], [address] Phone: [phone]   Fax:  [phone]

## 2015-04-28 ENCOUNTER — Ambulatory Visit: Payer: Medicare Other | Admitting: Physical Therapy

## 2015-04-28 DIAGNOSIS — M545 Low back pain: Secondary | ICD-10-CM

## 2015-04-28 NOTE — Therapy (Signed)
[facility] [address]  [place], [place], [address] Phone: [phone]   Fax:  [phone]  Physical Therapy Treatment  Patient Details  Name: Megan Duncan MRN: [id number] Date of Birth: 06-20-1946 Referring Provider:  Midge Minium, MD  Encounter Date: 04/28/2015      PT End of Session - 04/28/15 1100    Visit Number 3   Number of Visits 12   Date for PT Re-Evaluation 05/19/15   PT Start Time 1059   PT Stop Time 9528   PT Time Calculation (min) 56 min      Past Medical History  Diagnosis Date  . Anxiety   . Depression   . LBP (low back pain)   . Bipolar II disorder     Dr. Charlott Holler  . Arthritis     hands  . Hypertension   . Diverticulosis   . Hyperlipidemia     Past Surgical History  Procedure Laterality Date  . Appendectomy    . Total abdominal hysterectomy    . Tonsillectomy    . Knee arthroscopy  2008    right    There were no vitals filed for this visit.  Visit Diagnosis:  Bilateral low back pain, with sciatica presence unspecified      Subjective Assessment - 04/28/15 1105    Subjective states last treatment seemed to go well and pain was more centralized for a while but states has returned to L buttock today. She states she is performing HEP.   Currently in Pain? Yes   Pain Score 6       TherEx- Nustep level 5, 4' Hookyling B Hip ABD Black TB 2x15 Hooklying B Hip ADD Ball 2x10x5" Stretch SKTC  2x20",  DKTC Manual - Grade 2 mobes to R pelvic innominant into posterior torsion followed by MET to R pelvic innominant due to anterior torsion Bridge 2x10 Hooklying TrA with ALT SLR 10x Hooklying TrA+March 10x Seated B Hip ABD Black TB 15x Seated B Hip ADD Ball 10x5"  IFC (80-150Hz) with CP to L SI region 15'          PT Short Term Goals - 04/28/15 1144    PT SHORT TERM GOAL #1   Title pt independent with initial HEP by 04/16/15   Status Achieved            PT Long Term Goals - 04/25/15 0854    PT LONG TERM GOAL #1   Title pt independent with advanced HEP for continued progress by 05/19/15   Status On-going   PT LONG TERM GOAL #2   Title pt able to stand 20 minutes or greater to allow cooking without limit by pain by 05/19/15   Status On-going   PT LONG TERM GOAL #3   Title pt able to walk 30 minutes to allow shopping and walking within airport without limitation by pain by 05/19/15   Status On-going   PT LONG TERM GOAL #4   Title B LE MMT 4+/5 or better grossly by 05/19/15   Status On-going               Plan - 04/28/15 1142    Clinical Impression Statement pt with R pelvic innominant with anterior torsion vs L today so performed gentle mobes and MET to see if can improve c/o L sided pain.  Manual tolerated well and pelvis seemed more neutral following manual but no immediate benefit noted with mobility or  pain.  Followed treatment with ice and e-stim today due to manual.   PT Next Visit Plan Continue lumbopelvic stability as able, progress to standing exercises; manual and modalities PRN; try taping   Consulted and Agree with Plan of Care Patient        Problem List Patient Active Problem List   Diagnosis Date Noted  . Shingles outbreak 04/14/2015  . Hyperlipidemia 01/13/2015  . Fibromyalgia 10/14/2014  . Bilateral hip pain 09/02/2014  . 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 Routine general medical examination at a health care facility 07/15/2014  . Ceruminosis 03/04/2014  . Tinnitus 03/04/2014  . Torticollis 03/04/2014  . Bipolar disorder with moderate depression 12/03/2013  . Sinusitis, acute maxillary 09/14/2013  . Low back pain 08/06/2012  . Lower abdominal pain 08/06/2012  . Physical exam, annual 06/17/2012  . Abscess 04/11/2011  . URI 09/19/2010  . OTITIS MEDIA 09/05/2010  . OBESITY 07/20/2010  . PERIPHERAL NEUROPATHY 02/22/2010  . Lumbar back pain with radiculopathy affecting left lower extremity 01/24/2010  .  ABSCESS, SUPRAPUBIC 09/15/2009  . COCCYGEAL PAIN 12/14/2008  . SKIN LESION 11/24/2008  . NECK PAIN, CHRONIC 06/20/2007  . BACK PAIN, LUMBAR 06/20/2007  . ANXIETY 04/23/2007  . Essential hypertension 04/23/2007  . ENDOMETRITIS 04/23/2007    Gerry Blanchfield PT, OCS 04/28/2015, 12:40 PM  [facility] [address]  [place], [place], [address] Phone: [phone]   Fax:  [phone]

## 2015-05-02 ENCOUNTER — Ambulatory Visit: Payer: Medicare Other | Admitting: Physical Therapy

## 2015-05-02 DIAGNOSIS — M545 Low back pain: Secondary | ICD-10-CM | POA: Diagnosis not present

## 2015-05-02 NOTE — Therapy (Signed)
[facility] [address]  [place], [place], [address] Phone: [phone]   Fax:  [phone]  Physical Therapy Treatment  Patient Details  Name: Megan Duncan MRN: [id number] Date of Birth: 03/24/1946 Referring Provider:  Midge Minium, MD  Encounter Date: 05/02/2015      PT End of Session - 05/02/15 0934    Visit Number 4   Number of Visits 12   Date for PT Re-Evaluation 05/19/15   PT Start Time 0932   PT Stop Time 1022   PT Time Calculation (min) 50 min      Past Medical History  Diagnosis Date  . Anxiety   . Depression   . LBP (low back pain)   . Bipolar II disorder     Dr. Charlott Holler  . Arthritis     hands  . Hypertension   . Diverticulosis   . Hyperlipidemia     Past Surgical History  Procedure Laterality Date  . Appendectomy    . Total abdominal hysterectomy    . Tonsillectomy    . Knee arthroscopy  2008    right    There were no vitals filed for this visit.  Visit Diagnosis:  Bilateral low back pain, with sciatica presence unspecified      Subjective Assessment - 05/02/15 0933    Subjective States felt improvement since last treatment stating feels best she has in a while today.   Currently in Pain? Yes   Pain Score 3    Pain Location Back   Pain Orientation Lower;Left;Right           TherEx- Nustep level 5, 4' Seated B Hip ABD Black TB 20x Seated B Hip ADD Ball 10x5" Sit->Stand with Green TB at knees 10x Hooklying TrA+March 15x Bridge 10x FOB (55cm) Tuck with TrA 15x FOB (55cm) LTR 10x SLR with TrA 10x each (not ALT, performed with contra LE flexed today) SKTC Stretch Bridge 10x (attempted quadruped exercises today but pt unable due to c/o knee pain with this position) Partial Curl-up 15x Hookyling B Hip ABD Black TB 20 Hooklying B Hip ADD Ball 10x5"  Mechanical Traction - Hooklying, Slight Flexion, 40#/20#, 60"/20", 10' (pain-free during treatment)  IFC  (80-150Hz ) with CP to L SI region 79'                        PT Short Term Goals - 04/28/15 1144    PT SHORT TERM GOAL #1   Title pt independent with initial HEP by 04/16/15   Status Achieved           PT Long Term Goals - 04/25/15 0854    PT LONG TERM GOAL #1   Title pt independent with advanced HEP for continued progress by 05/19/15   Status On-going   PT LONG TERM GOAL #2   Title pt able to stand 20 minutes or greater to allow cooking without limit by pain by 05/19/15   Status On-going   PT LONG TERM GOAL #3   Title pt able to walk 30 minutes to allow shopping and walking within airport without limitation by pain by 05/19/15   Status On-going   PT LONG TERM GOAL #4   Title B LE MMT 4+/5 or better grossly by 05/19/15   Status On-going               Plan - 05/02/15 1012    Clinical Impression Statement  pt progressed well following last treatment which included manual for pelvic torsion.  Pelvis seems level today so did not repeat manual; however, pt c/o increased L hip/buttock pain with treatment again today.  Trial traction today to assess potential benefit.   PT Next Visit Plan Continue lumbopelvic stability as able, progress to standing exercises; manual and modalities PRN; try taping?        Problem List Patient Active Problem List   Diagnosis Date Noted  . Shingles outbreak 04/14/2015  . Hyperlipidemia 01/13/2015  . Fibromyalgia 10/14/2014  . Bilateral hip pain 09/02/2014  . 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 Routine general medical examination at a health care facility 07/15/2014  . Ceruminosis 03/04/2014  . Tinnitus 03/04/2014  . Torticollis 03/04/2014  . Bipolar disorder with moderate depression 12/03/2013  . Sinusitis, acute maxillary 09/14/2013  . Low back pain 08/06/2012  . Lower abdominal pain 08/06/2012  . Physical exam, annual 06/17/2012  . Abscess 04/11/2011  . URI 09/19/2010  . OTITIS MEDIA 09/05/2010  . OBESITY 07/20/2010  .  PERIPHERAL NEUROPATHY 02/22/2010  . Lumbar back pain with radiculopathy affecting left lower extremity 01/24/2010  . ABSCESS, SUPRAPUBIC 09/15/2009  . COCCYGEAL PAIN 12/14/2008  . SKIN LESION 11/24/2008  . NECK PAIN, CHRONIC 06/20/2007  . BACK PAIN, LUMBAR 06/20/2007  . ANXIETY 04/23/2007  . Essential hypertension 04/23/2007  . ENDOMETRITIS 04/23/2007    Larua Collier PT, OCS 05/02/2015, 10:24 AM  [facility] [place], [place], [address] Phone: [phone]   Fax:  [phone]

## 2015-05-05 ENCOUNTER — Ambulatory Visit: Payer: Medicare Other | Admitting: Physical Therapy

## 2015-05-09 ENCOUNTER — Ambulatory Visit: Payer: Medicare Other | Admitting: Rehabilitation

## 2015-05-09 DIAGNOSIS — M545 Low back pain: Secondary | ICD-10-CM

## 2015-05-09 NOTE — Therapy (Signed)
[facility] [address]  [place], [place], [address] Phone: [phone]   Fax:  [phone]  Physical Therapy Treatment  Patient Details  Name: Megan Duncan MRN: [id number] Date of Birth: 06-18-1946 Referring Provider:  Midge Minium, MD  Encounter Date: 05/09/2015      PT End of Session - 05/09/15 0936    Visit Number 5   Number of Visits 12   Date for PT Re-Evaluation 05/19/15   PT Start Time 0930   PT Stop Time 1026   PT Time Calculation (min) 56 min      Past Medical History  Diagnosis Date  . Anxiety   . Depression   . LBP (low back pain)   . Bipolar II disorder     Dr. Charlott Holler  . Arthritis     hands  . Hypertension   . Diverticulosis   . Hyperlipidemia     Past Surgical History  Procedure Laterality Date  . Appendectomy    . Total abdominal hysterectomy    . Tonsillectomy    . Knee arthroscopy  2008    right    There were no vitals filed for this visit.  Visit Diagnosis:  Bilateral low back pain, with sciatica presence unspecified      Subjective Assessment - 05/09/15 0933    Subjective Reports she is very pleased with how she is walking this morning. Was sore for a few days after last time but traction felt good while she was on it.    Currently in Pain? Yes   Pain Score 4    Pain Location Back   Pain Orientation Lower;Right;Left      TherEx- Nustep level 5, 4' Hooklying Single Hip ABD Black TB 20x Hooklying Single Hip ADD Black TB 20x Stretch Bil SKTC, Piriformis, Hamstring Bridge 10x Hooklying TrA+March 10x2 Hooklying TrA+SLR 10x Bridge 10x Partial Curl-up 15x FOB (55cm) Tuck with TrA 15x LTR x1' Side-Lying Clam ER 10x Side-Lying Clam ABD 10x (hard according to pt) Seated Hip ADD with ball 10x5" Sit->Stand with Green TB at knees 10x  Mechanical Traction - Hooklying, Slight Flexion, 44#/22#, 60"/20", 12'        PT Short Term Goals - 04/28/15 1144    PT SHORT TERM GOAL #1   Title pt independent with initial HEP by 04/16/15   Status Achieved           PT Long Term Goals - 04/25/15 0854    PT LONG TERM GOAL #1   Title pt independent with advanced HEP for continued progress by 05/19/15   Status On-going   PT LONG TERM GOAL #2   Title pt able to stand 20 minutes or greater to allow cooking without limit by pain by 05/19/15   Status On-going   PT LONG TERM GOAL #3   Title pt able to walk 30 minutes to allow shopping and walking within airport without limitation by pain by 05/19/15   Status On-going   PT LONG TERM GOAL #4   Title B LE MMT 4+/5 or better grossly by 05/19/15   Status On-going               Plan - 05/09/15 1000    Clinical Impression Statement Good tolerance to treatment with minimal complaint of pain in Lt hip/buttock. Pt did state that Constance Haw seems easier today with less pain.    PT Next Visit Plan Continue lumbopelvic stability as able, progress to  standing exercises; manual and modalities PRN; try taping?        Problem List Patient Active Problem List   Diagnosis Date Noted  . Shingles outbreak 04/14/2015  . Hyperlipidemia 01/13/2015  . Fibromyalgia 10/14/2014  . Bilateral hip pain 09/02/2014  . 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 Routine general medical examination at a health care facility 07/15/2014  . Ceruminosis 03/04/2014  . Tinnitus 03/04/2014  . Torticollis 03/04/2014  . Bipolar disorder with moderate depression 12/03/2013  . Sinusitis, acute maxillary 09/14/2013  . Low back pain 08/06/2012  . Lower abdominal pain 08/06/2012  . Physical exam, annual 06/17/2012  . Abscess 04/11/2011  . URI 09/19/2010  . OTITIS MEDIA 09/05/2010  . OBESITY 07/20/2010  . PERIPHERAL NEUROPATHY 02/22/2010  . Lumbar back pain with radiculopathy affecting left lower extremity 01/24/2010  . ABSCESS, SUPRAPUBIC 09/15/2009  . COCCYGEAL PAIN 12/14/2008  . SKIN LESION 11/24/2008  . NECK PAIN, CHRONIC 06/20/2007  .  BACK PAIN, LUMBAR 06/20/2007  . ANXIETY 04/23/2007  . Essential hypertension 04/23/2007  . ENDOMETRITIS 04/23/2007    Barbette Hair, PTA 05/09/2015, 10:14 AM  [facility] [address]  [place], [place], [address] Phone: [phone]   Fax:  [phone]

## 2015-05-10 ENCOUNTER — Ambulatory Visit: Payer: Medicare Other | Admitting: Rehabilitation

## 2015-05-10 DIAGNOSIS — M545 Low back pain: Secondary | ICD-10-CM | POA: Diagnosis not present

## 2015-05-10 NOTE — Therapy (Signed)
[facility] [address]  [place], [place], [address] Phone: [phone]   Fax:  [phone]  Physical Therapy Treatment  Patient Details  Name: Megan Duncan MRN: [id number] Date of Birth: 1946/11/11 Referring Provider:  Midge Minium, MD  Encounter Date: 05/10/2015      PT End of Session - 05/10/15 0935    Visit Number 6   Number of Visits 12   Date for PT Re-Evaluation 05/19/15   PT Start Time 0931   PT Stop Time 1025   PT Time Calculation (min) 54 min   Activity Tolerance Patient tolerated treatment well   Behavior During Therapy [facility] for tasks assessed/performed      Past Medical History  Diagnosis Date  . Anxiety   . Depression   . LBP (low back pain)   . Bipolar II disorder     Dr. Charlott Holler  . Arthritis     hands  . Hypertension   . Diverticulosis   . Hyperlipidemia     Past Surgical History  Procedure Laterality Date  . Appendectomy    . Total abdominal hysterectomy    . Tonsillectomy    . Knee arthroscopy  2008    right    There were no vitals filed for this visit.  Visit Diagnosis:  Bilateral low back pain, with sciatica presence unspecified      Subjective Assessment - 05/10/15 0933    Subjective "Doing good today." Notes only "muscle like pain" today and states she is trying to not think about the pain. Notes she is walking better and more upright. Reports her back hurt a little after traction yesterday and would like to do the e-stim today.    How long can you stand comfortably? 5 minutesd   How long can you walk comfortably? 5-15 minutes (uphill is harder than level ground)   Currently in Pain? Yes   Pain Score --  3-4/10   Pain Location Back   Pain Orientation Lower;Right;Left      TherEx- Nustep level 5, 4 1/2' Seated Hip ADD with ball 5"x10x2 Seated Hip ABD with Black TB 10x2 Seated Alt UE/LE lift 10x each Sit-> Stand with Green TB at knees 12x Bridge  10x FOB (55cm) Tuck with TrA 10x2 FOB (55cm) LTR 10x Stretch Bil SKTC, Piriformis, Hamstring Bridge 10x LTR 10x (noted a pain on the Lt after a few reps) Hooklying Hip ADD with ball 5"x10x2  IFC (80-150) with CP to L/S area with pt prone x15'      PT Short Term Goals - 04/28/15 1144    PT SHORT TERM GOAL #1   Title pt independent with initial HEP by 04/16/15   Status Achieved           PT Long Term Goals - 04/25/15 0854    PT LONG TERM GOAL #1   Title pt independent with advanced HEP for continued progress by 05/19/15   Status On-going   PT LONG TERM GOAL #2   Title pt able to stand 20 minutes or greater to allow cooking without limit by pain by 05/19/15   Status On-going   PT LONG TERM GOAL #3   Title pt able to walk 30 minutes to allow shopping and walking within airport without limitation by pain by 05/19/15   Status On-going   PT LONG TERM GOAL #4   Title B LE MMT 4+/5 or better grossly by 05/19/15   Status On-going  Plan - 05/10/15 1011    Clinical Impression Statement Pt noted Lt glute/spine pain with LTR that continued with the rest of her treatment. Went back to e-stim per pt preference due to higher pain levels.    PT Next Visit Plan Continue lumbopelvic stability as able, progress to standing exercises; manual and modalities PRN; try taping?   Consulted and Agree with Plan of Care Patient        Problem List Patient Active Problem List   Diagnosis Date Noted  . Shingles outbreak 04/14/2015  . Hyperlipidemia 01/13/2015  . Fibromyalgia 10/14/2014  . Bilateral hip pain 09/02/2014  . 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 Routine general medical examination at a health care facility 07/15/2014  . Ceruminosis 03/04/2014  . Tinnitus 03/04/2014  . Torticollis 03/04/2014  . Bipolar disorder with moderate depression 12/03/2013  . Sinusitis, acute maxillary 09/14/2013  . Low back pain 08/06/2012  . Lower abdominal pain 08/06/2012  . Physical exam,  annual 06/17/2012  . Abscess 04/11/2011  . URI 09/19/2010  . OTITIS MEDIA 09/05/2010  . OBESITY 07/20/2010  . PERIPHERAL NEUROPATHY 02/22/2010  . Lumbar back pain with radiculopathy affecting left lower extremity 01/24/2010  . ABSCESS, SUPRAPUBIC 09/15/2009  . COCCYGEAL PAIN 12/14/2008  . SKIN LESION 11/24/2008  . NECK PAIN, CHRONIC 06/20/2007  . BACK PAIN, LUMBAR 06/20/2007  . ANXIETY 04/23/2007  . Essential hypertension 04/23/2007  . ENDOMETRITIS 04/23/2007    Barbette Hair, PTA 05/10/2015, 11:31 AM  [facility] [address]  [place], [place], [address] Phone: [phone]   Fax:  [phone]

## 2015-05-12 ENCOUNTER — Ambulatory Visit: Payer: Medicare Other | Admitting: Physical Therapy

## 2015-05-12 DIAGNOSIS — M545 Low back pain: Secondary | ICD-10-CM

## 2015-05-12 NOTE — Therapy (Signed)
[facility] [address]  [place], [place], [address] Phone: [phone]   Fax:  [phone]  Physical Therapy Treatment  Patient Details  Name: Megan Duncan MRN: [id number] Date of Birth: 02/28/1946 Referring Provider:  Midge Minium, MD  Encounter Date: 05/12/2015      PT End of Session - 05/12/15 1035    Visit Number 7   Number of Visits 12   Date for PT Re-Evaluation 05/19/15   PT Start Time 0932   PT Stop Time 6967   PT Time Calculation (min) 57 min      Past Medical History  Diagnosis Date  . Anxiety   . Depression   . LBP (low back pain)   . Bipolar II disorder     Dr. Charlott Holler  . Arthritis     hands  . Hypertension   . Diverticulosis   . Hyperlipidemia     Past Surgical History  Procedure Laterality Date  . Appendectomy    . Total abdominal hysterectomy    . Tonsillectomy    . Knee arthroscopy  2008    right    There were no vitals filed for this visit.  Visit Diagnosis:  Bilateral low back pain, with sciatica presence unspecified      Subjective Assessment - 05/12/15 0939    Subjective states she feels as though she's progressing well lately.  States feels as though is walking more upright and sit->stand is getting easier.   Currently in Pain? Yes   Pain Score --  2-3/10   Pain Location Back   Pain Orientation Lower   Pain Radiating Towards extending B B buttock today and at times notes tingle in B feet (typically one at a time) while standing/walking         TODAY'S TREATMENT TherEx - NuStep lvl 5, 4' Partial Curl-up 2x10 Bridge 2x10 Supine ALT SLR with pelvic tilt 2x6 Sit-> Stand with Green TB at knees 12x FOB (55cm) Tuck with TrA 10x FOB (55cm) LTR with LE 90/90 10x FOB (55cm) Tuck with partial curl-up 8x Stretch Bil SKTC, Piriformis, Hamstring  Manual - B LE Nerve glides with increasing hip add to tolerance  TherEx -  PBall Wall Slide 10x (c/o  inceased L/S pain extending to heels) Low Row 20# 12x Single Hand Low Row 10# 10x each (notes mild pain extending to R ankle with R low row)  Manual - TPR R glutes and piriformis (very hypersensitive B) then Dry Needling to L proximal glutes along IC, into glute med belly, and into medial aspect of piriformis.  Verbal informed consent given prior to treatment.  Treatment tolerated very well with some decreased sensitivity noted following completion.                         PT Short Term Goals - 04/28/15 1144    PT SHORT TERM GOAL #1   Title pt independent with initial HEP by 04/16/15   Status Achieved           PT Long Term Goals - 04/25/15 0854    PT LONG TERM GOAL #1   Title pt independent with advanced HEP for continued progress by 05/19/15   Status On-going   PT LONG TERM GOAL #2   Title pt able to stand 20 minutes or greater to allow cooking without limit by pain by 05/19/15   Status On-going   PT LONG TERM  GOAL #3   Title pt able to walk 30 minutes to allow shopping and walking within airport without limitation by pain by 05/19/15   Status On-going   PT LONG TERM GOAL #4   Title B LE MMT 4+/5 or better grossly by 05/19/15   Status On-going               Plan - 05/12/15 1037    Clinical Impression Statement pt reports noting improvement with regard to pain and level of function (walking and sit->stand transfers getting easier).  Noted pain extending to heels with pball wall squat as well as with low row exercises today.  Very hypersensitive throughout B pelvic IC and piriformis and significant B LE symptom provocation with LE nerve glides.  Seems portion of symptoms could be piriformis syndrome in nature.  Trial dry needling to L buttock tolerated very well today.   PT Next Visit Plan Continue lumbopelvic stability as able, progress standing exercises as able; manual and modalities PRN; taping and/or dry needling PRN   Consulted and Agree with Plan of Care  Patient        Problem List Patient Active Problem List   Diagnosis Date Noted  . Shingles outbreak 04/14/2015  . Hyperlipidemia 01/13/2015  . Fibromyalgia 10/14/2014  . Bilateral hip pain 09/02/2014  . 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 Routine general medical examination at a health care facility 07/15/2014  . Ceruminosis 03/04/2014  . Tinnitus 03/04/2014  . Torticollis 03/04/2014  . Bipolar disorder with moderate depression 12/03/2013  . Sinusitis, acute maxillary 09/14/2013  . Low back pain 08/06/2012  . Lower abdominal pain 08/06/2012  . Physical exam, annual 06/17/2012  . Abscess 04/11/2011  . URI 09/19/2010  . OTITIS MEDIA 09/05/2010  . OBESITY 07/20/2010  . PERIPHERAL NEUROPATHY 02/22/2010  . Lumbar back pain with radiculopathy affecting left lower extremity 01/24/2010  . ABSCESS, SUPRAPUBIC 09/15/2009  . COCCYGEAL PAIN 12/14/2008  . SKIN LESION 11/24/2008  . NECK PAIN, CHRONIC 06/20/2007  . BACK PAIN, LUMBAR 06/20/2007  . ANXIETY 04/23/2007  . Essential hypertension 04/23/2007  . ENDOMETRITIS 04/23/2007    Subhan Hoopes PT, OCS 05/12/2015, 10:42 AM  [facility] [place], [place], [address] Phone: [phone]   Fax:  [phone]

## 2015-05-17 ENCOUNTER — Ambulatory Visit: Payer: Medicare Other | Admitting: Rehabilitation

## 2015-05-20 ENCOUNTER — Ambulatory Visit: Payer: Medicare Other | Attending: Family Medicine | Admitting: Rehabilitation

## 2015-05-20 DIAGNOSIS — M545 Low back pain: Secondary | ICD-10-CM | POA: Diagnosis not present

## 2015-05-20 DIAGNOSIS — R262 Difficulty in walking, not elsewhere classified: Secondary | ICD-10-CM | POA: Insufficient documentation

## 2015-05-20 NOTE — Therapy (Signed)
[facility] [address]  [place], [place], [address] Phone: [phone]   Fax:  [phone]  Physical Therapy Treatment  Patient Details  Name: Megan Duncan MRN: [id number] Date of Birth: [birth year] Referring Provider:  Midge Minium, MD  Encounter Date: 05/20/2015      PT End of Session - 05/20/15 0931    Visit Number 8   Number of Visits 12   Date for PT Re-Evaluation 05/19/15   PT Start Time 0930   PT Stop Time 1008   PT Time Calculation (min) 38 min      Past Medical History  Diagnosis Date  . Anxiety   . Depression   . LBP (low back pain)   . Bipolar II disorder     Dr. Charlott Holler  . Arthritis     hands  . Hypertension   . Diverticulosis   . Hyperlipidemia     Past Surgical History  Procedure Laterality Date  . Appendectomy    . Total abdominal hysterectomy    . Tonsillectomy    . Knee arthroscopy  2008    right    There were no vitals filed for this visit.  Visit Diagnosis:  Bilateral low back pain, with sciatica presence unspecified      Subjective Assessment - 05/20/15 0936    Subjective Reports doing well today but was having some LBP earlier in the week. Not so much the glutes but the back.    Currently in Pain? Yes   Pain Score 2    Pain Location Back   Pain Orientation Lower        TherEx - NuStep lvl 5, 5' Bridges x15 Partial Curl-ups x15 FOB (55cm) Tuck with TrA 10x FOB (55cm) Tuck with partial curl-up 8x LTRx2' Bridges x15 Stretch Bil SKTC, Piriformis, Hamstring  Manual - B LE Nerve glides with increasing hip add to tolerance  TherEx- Hooklying Hip Add with ball 5"x10x2  Low Row 20# 2x10        PT Short Term Goals - 04/28/15 1144    PT SHORT TERM GOAL #1   Title pt independent with initial HEP by 04/16/15   Status Achieved           PT Long Term Goals - 04/25/15 0854    PT LONG TERM GOAL #1   Title pt independent with advanced HEP for  continued progress by 05/19/15   Status On-going   PT LONG TERM GOAL #2   Title pt able to stand 20 minutes or greater to allow cooking without limit by pain by 05/19/15   Status On-going   PT LONG TERM GOAL #3   Title pt able to walk 30 minutes to allow shopping and walking within airport without limitation by pain by 05/19/15   Status On-going   PT LONG TERM GOAL #4   Title B LE MMT 4+/5 or better grossly by 05/19/15   Status On-going               Plan - 05/20/15 1009    Clinical Impression Statement Pt requried more rest breaks today due to Lt glute pain but was able to push through for most of treatment. Continues nerve glides with improved tolerance today reported by pt but very sensitive with Lt piriformis stretch. Pt continuest to be motivated and wants to keep trying therapy as it seems to help.     PT Next Visit Plan Continue lumbopelvic stability  as able, progress standing exercises as able; manual and modalities PRN; taping and/or dry needling PRN; renewal?   Consulted and Agree with Plan of Care Patient        Problem List Patient Active Problem List   Diagnosis Date Noted  . Shingles outbreak 04/14/2015  . Hyperlipidemia 01/13/2015  . Fibromyalgia 10/14/2014  . Bilateral hip pain 09/02/2014  . 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 Routine general medical examination at a health care facility 07/15/2014  . Ceruminosis 03/04/2014  . Tinnitus 03/04/2014  . Torticollis 03/04/2014  . Bipolar disorder with moderate depression 12/03/2013  . Sinusitis, acute maxillary 09/14/2013  . Low back pain 08/06/2012  . Lower abdominal pain 08/06/2012  . Physical exam, annual 06/17/2012  . Abscess 04/11/2011  . URI 09/19/2010  . OTITIS MEDIA 09/05/2010  . OBESITY 07/20/2010  . PERIPHERAL NEUROPATHY 02/22/2010  . Lumbar back pain with radiculopathy affecting left lower extremity 01/24/2010  . ABSCESS, SUPRAPUBIC 09/15/2009  . COCCYGEAL PAIN 12/14/2008  . SKIN LESION 11/24/2008   . NECK PAIN, CHRONIC 06/20/2007  . BACK PAIN, LUMBAR 06/20/2007  . ANXIETY 04/23/2007  . Essential hypertension 04/23/2007  . ENDOMETRITIS 04/23/2007    Barbette Hair, PTA 05/20/2015, 10:11 AM  [facility] [address]  [place], [place], [address] Phone: [phone]   Fax:  [phone]

## 2015-05-23 ENCOUNTER — Ambulatory Visit: Payer: Medicare Other | Admitting: Rehabilitation

## 2015-05-26 ENCOUNTER — Ambulatory Visit: Payer: Medicare Other | Admitting: Physical Therapy

## 2015-05-26 DIAGNOSIS — M545 Low back pain: Secondary | ICD-10-CM

## 2015-05-26 DIAGNOSIS — R262 Difficulty in walking, not elsewhere classified: Secondary | ICD-10-CM | POA: Diagnosis not present

## 2015-05-26 NOTE — Therapy (Signed)
[facility] [address]  [place], [place], [address] Phone: [phone]   Fax:  [phone]  Physical Therapy Treatment  Patient Details  Name: Megan Duncan MRN: [id number] Date of Birth: Jan 01, 1946 Referring Provider:  Midge Minium, MD  Encounter Date: 05/26/2015      PT End of Session - 05/26/15 0938    Visit Number 9   Number of Visits 12   Date for PT Re-Evaluation 06/03/15   PT Start Time 0935   PT Stop Time 1020   PT Time Calculation (min) 45 min      Past Medical History  Diagnosis Date  . Anxiety   . Depression   . LBP (low back pain)   . Bipolar II disorder     Dr. Charlott Holler  . Arthritis     hands  . Hypertension   . Diverticulosis   . Hyperlipidemia     Past Surgical History  Procedure Laterality Date  . Appendectomy    . Total abdominal hysterectomy    . Tonsillectomy    . Knee arthroscopy  2008    right    There were no vitals filed for this visit.  Visit Diagnosis:  Bilateral low back pain, with sciatica presence unspecified      Subjective Assessment - 05/26/15 0936    Subjective "I'm pretty good, able to stand up straight; making headway I think"   Currently in Pain? Yes   Pain Score 3   2-3/10 today   Pain Location Back   Pain Orientation Lower   Pain Radiating Towards extends into B buttock and LE's at times (L more often than R) and intermittent tingling into feet (noted in R foot yesterday) with standing and walking             TherEx - Bridges 10x5" Stretch B HS, Piri, STKC  Manual - contract/relax L HS and L Piri; B LE nerve glide  TherEx - SLR with TrA 6x3" each LTR 1' Side-Lying hip ABD 15x each Quadruped LE raise 10x each Prone SLR 10x each Prone Hip ER ball at ankles 2x10x5" Prone Hip IR Red TB at ankles 2x10 Staggered Standing one-arm row Black TB 12x each (in mini lunge and mild posterior pelvic tilt) Staggered Standing Punch Black  TB 12x each (in mini lunge and mild posterior pelvic tilt) Hooklying pelvic clock attempting to gain some control                        PT Short Term Goals - 04/28/15 1144    PT SHORT TERM GOAL #1   Title pt independent with initial HEP by 04/16/15   Status Achieved           PT Long Term Goals - 05/26/15 1054    PT LONG TERM GOAL #1   Title pt independent with advanced HEP for continued progress by 06/03/15   Status On-going   PT LONG TERM GOAL #2   Title pt able to stand 20 minutes or greater to allow cooking without limit by pain by 06/03/15   Status On-going   PT LONG TERM GOAL #3   Title pt able to walk 30 minutes to allow shopping and walking within airport without limitation by pain by 06/03/15   Status On-going   PT LONG TERM GOAL #4   Title B LE MMT 4+/5 or better grossly by 06/03/15   Status On-going  Plan - 05/26/15 1058    Clinical Impression Statement pt notes some improvement in pain but still with limited tolerance with exercises in clinic.  Pain seems could be soft tissue dysfunction in glutes/piri along with lower lumbar facet pain (pain with extension and anterior pelvic tilt).   PT Next Visit Plan Continue lumbopelvic stability as able, progress standing exercises as able; manual and modalities PRN; taping and/or dry needling PRN   Consulted and Agree with Plan of Care Patient        Problem List Patient Active Problem List   Diagnosis Date Noted  . Shingles outbreak 04/14/2015  . Hyperlipidemia 01/13/2015  . Fibromyalgia 10/14/2014  . Bilateral hip pain 09/02/2014  . 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 Routine general medical examination at a health care facility 07/15/2014  . Ceruminosis 03/04/2014  . Tinnitus 03/04/2014  . Torticollis 03/04/2014  . Bipolar disorder with moderate depression 12/03/2013  . Sinusitis, acute maxillary 09/14/2013  . Low back pain 08/06/2012  . Lower abdominal pain 08/06/2012  .  Physical exam, annual 06/17/2012  . Abscess 04/11/2011  . URI 09/19/2010  . OTITIS MEDIA 09/05/2010  . OBESITY 07/20/2010  . PERIPHERAL NEUROPATHY 02/22/2010  . Lumbar back pain with radiculopathy affecting left lower extremity 01/24/2010  . ABSCESS, SUPRAPUBIC 09/15/2009  . COCCYGEAL PAIN 12/14/2008  . SKIN LESION 11/24/2008  . NECK PAIN, CHRONIC 06/20/2007  . BACK PAIN, LUMBAR 06/20/2007  . ANXIETY 04/23/2007  . Essential hypertension 04/23/2007  . ENDOMETRITIS 04/23/2007    Sadonna Kotara PT, OCS 05/26/2015, 11:01 AM  [facility] [place], [place], [address] Phone: [phone]   Fax:  [phone]

## 2015-05-30 ENCOUNTER — Ambulatory Visit: Payer: Medicare Other | Admitting: Internal Medicine

## 2015-05-30 ENCOUNTER — Ambulatory Visit: Payer: Medicare Other | Admitting: Physician Assistant

## 2015-05-30 ENCOUNTER — Ambulatory Visit: Payer: Medicare Other | Admitting: Rehabilitation

## 2015-05-30 DIAGNOSIS — M545 Low back pain: Secondary | ICD-10-CM

## 2015-05-30 DIAGNOSIS — R262 Difficulty in walking, not elsewhere classified: Secondary | ICD-10-CM | POA: Diagnosis not present

## 2015-05-30 NOTE — Therapy (Signed)
[facility] [address]  [place], [place], [address] Phone: [phone]   Fax:  [phone]  Physical Therapy Treatment  Patient Details  Name: Megan Duncan MRN: [id number] Date of Birth: 14-Oct-1946 Referring Provider:  Midge Minium, MD  Encounter Date: 05/30/2015      PT End of Session - 05/30/15 0929    Visit Number 10   Number of Visits 12   Date for PT Re-Evaluation 06/03/15   PT Start Time 0925   PT Stop Time 1009   PT Time Calculation (min) 44 min      Past Medical History  Diagnosis Date  . Anxiety   . Depression   . LBP (low back pain)   . Bipolar II disorder     Dr. Charlott Holler  . Arthritis     hands  . Hypertension   . Diverticulosis   . Hyperlipidemia     Past Surgical History  Procedure Laterality Date  . Appendectomy    . Total abdominal hysterectomy    . Tonsillectomy    . Knee arthroscopy  2008    right    There were no vitals filed for this visit.  Visit Diagnosis:  Bilateral low back pain, with sciatica presence unspecified      Subjective Assessment - 05/30/15 0927    Subjective States it took her 2 days to recover from last time but thinks she is doing better all over.    Currently in Pain? Yes   Pain Score 2    Pain Location Back   Pain Orientation Lower      TherEx -Nustep level 5x5' Bridges 10x5" Stretch B HS, Piri, STKC  Manual -B LE nerve glide  LTR x1' SLR with TrA 10x3" Prone SLR 10x each  Prone Hip ER ball at ankles 10x5" Seated Anti Trunk Rotation Green TB 10x3" Seated Hip Add with Ball 10x5" Staggered Standing one-arm row Black TB 12x each (in mini lunge and mild posterior pelvic tilt) Seated Hip Abd Black TB 15x Low Row 20# 15x  FOTO score 57% limitation (CK)       PT Short Term Goals - 04/28/15 1144    PT SHORT TERM GOAL #1   Title pt independent with initial HEP by 04/16/15   Status Achieved           PT Long Term Goals -  05/26/15 1054    PT LONG TERM GOAL #1   Title pt independent with advanced HEP for continued progress by 06/03/15   Status On-going   PT LONG TERM GOAL #2   Title pt able to stand 20 minutes or greater to allow cooking without limit by pain by 06/03/15   Status On-going   PT LONG TERM GOAL #3   Title pt able to walk 30 minutes to allow shopping and walking within airport without limitation by pain by 06/03/15   Status On-going   PT LONG TERM GOAL #4   Title B LE MMT 4+/5 or better grossly by 06/03/15   Status On-going               Plan - 05/30/15 1009    Clinical Impression Statement Noted most pain with standing exercises and seated anit rotation but pt able to complete. Did have relief with stretching bilateral piriformis and sktc.    PT Next Visit Plan Continue lumbopelvic stability as able, progress standing exercises as able; manual and modalities PRN; taping and/or  dry needling PRN   Consulted and Agree with Plan of Care Patient          G-Codes - 06-14-15 1301    Functional Assessment Tool Used FOTO 57% limitation   Functional Limitation Mobility: Walking and moving around   Mobility: Walking and Moving Around Current Status [phone]) At least 40 percent but less than 60 percent impaired, limited or restricted   Mobility: Walking and Moving Around Goal Status [phone]) At least 20 percent but less than 40 percent impaired, limited or restricted      Problem List Patient Active Problem List   Diagnosis Date Noted  . Shingles outbreak 04/14/2015  . Hyperlipidemia 01/13/2015  . Fibromyalgia 10/14/2014  . Bilateral hip pain 09/02/2014  . 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 Routine general medical examination at a health care facility 07/15/2014  . Ceruminosis 03/04/2014  . Tinnitus 03/04/2014  . Torticollis 03/04/2014  . Bipolar disorder with moderate depression 12/03/2013  . Sinusitis, acute maxillary 09/14/2013  . Low back pain 08/06/2012  . Lower abdominal pain  08/06/2012  . Physical exam, annual 06/17/2012  . Abscess 04/11/2011  . URI 09/19/2010  . OTITIS MEDIA 09/05/2010  . OBESITY 07/20/2010  . PERIPHERAL NEUROPATHY 02/22/2010  . Lumbar back pain with radiculopathy affecting left lower extremity 01/24/2010  . ABSCESS, SUPRAPUBIC 09/15/2009  . COCCYGEAL PAIN 12/14/2008  . SKIN LESION 11/24/2008  . NECK PAIN, CHRONIC 06/20/2007  . BACK PAIN, LUMBAR 06/20/2007  . ANXIETY 04/23/2007  . Essential hypertension 04/23/2007  . ENDOMETRITIS 04/23/2007    Barbette Hair, PTA June 14, 2015, 1:03 PM   Leonette Most PT, OCS June 14, 2015, 1:03 PM  [facility] [address]  [place], [place], [address] Phone: [phone]   Fax:  [phone]

## 2015-06-02 ENCOUNTER — Ambulatory Visit: Payer: Medicare Other | Admitting: Physical Therapy

## 2015-06-02 ENCOUNTER — Ambulatory Visit (INDEPENDENT_AMBULATORY_CARE_PROVIDER_SITE_OTHER): Payer: Medicare Other | Admitting: Medical

## 2015-06-02 ENCOUNTER — Encounter: Payer: Self-pay | Admitting: Medical

## 2015-06-02 VITALS — BP 128/80 | HR 93 | Temp 98.3°F | Ht 62.0 in | Wt 196.4 lb

## 2015-06-02 DIAGNOSIS — R262 Difficulty in walking, not elsewhere classified: Secondary | ICD-10-CM

## 2015-06-02 DIAGNOSIS — L309 Dermatitis, unspecified: Secondary | ICD-10-CM | POA: Insufficient documentation

## 2015-06-02 DIAGNOSIS — M545 Low back pain: Secondary | ICD-10-CM

## 2015-06-02 HISTORY — DX: Dermatitis, unspecified: L30.9

## 2015-06-02 MED ORDER — FLUOCINONIDE 0.05 % EX CREA: 1.0000 "application " | TOPICAL_CREAM | Freq: Two times a day (BID) | CUTANEOUS | Status: DC

## 2015-06-02 NOTE — Progress Notes (Signed)
Pre visit review using our clinic review tool, if applicable. No additional management support is needed unless otherwise documented below in the visit note. 

## 2015-06-02 NOTE — Progress Notes (Signed)
Subjective:    Patient ID: Megan Duncan, female    DOB: 03-24-1946, 69 y.o.   MRN: [id number]  HPI  Pt has small area hx of small blisters and papules. Pt has history of this occuring intermittently over the years. Pt has seen dermatologist over the years. Pt states in past prior dermatolgist could not give her exact diagnosis per pt report. But they did give topical potent steroid and at times oral prednisone. Pt had intermittent rash from 69 yo until early 30's. Then years without any rash.   But new recurrent papules. Other areas she states appears like vesicles. Distribution now left wrist, lt ankle. Rt wrist and rt ankle. But this papules only number about 6 in total. No rash on thorax. Couple up left forearm as well.  Pt has used daughter fluocinonide .05% cream.  Review of Systems  Constitutional: Negative for fever, chills and fatigue.  Respiratory: Negative for choking, shortness of breath and wheezing.   Cardiovascular: Negative for chest pain and palpitations.  Musculoskeletal: Negative for back pain.  Neurological: Negative for dizziness, facial asymmetry and headaches.  Hematological: Negative for adenopathy. Does not bruise/bleed easily.    Past Medical History  Diagnosis Date  . Anxiety   . Depression   . LBP (low back pain)   . Bipolar II disorder     Dr. Charlott Holler  . Arthritis     hands  . Hypertension   . Diverticulosis   . Hyperlipidemia     History   Social History  . Marital Status: Married    Spouse Name: N/A  . Number of Children: N/A  . Years of Education: N/A   Occupational History  . Not on file.   Social History Main Topics  . Smoking status: Former Smoker    Quit date: 07/02/2004  . Smokeless tobacco: Never Used  . Alcohol Use: Yes     Comment: occasional wine  . Drug Use: No  . Sexual Activity: Not on file   Other Topics Concern  . Not on file   Social History Narrative   Lives with husband and grandson she is raising.     Past Surgical History  Procedure Laterality Date  . Appendectomy    . Total abdominal hysterectomy    . Tonsillectomy    . Knee arthroscopy  2008    right    Family History  Problem Relation Age of Onset  . Diabetes Father   . Hypertension Father   . Hypertension Mother   . Mental illness Sister     suicide  . Colon cancer Maternal Aunt 80  . Heart attack Father     Allergies  Allergen Reactions  . Codeine Nausea And Vomiting  . Diclofenac Other (See Comments)    Elevated LFT's  . Doxycycline     Extreme nausea  . Sulfonamide Derivatives Swelling    Current Outpatient Prescriptions on File Prior to Visit  Medication Sig Dispense Refill  . albuterol (VENTOLIN HFA) 108 (90 BASE) MCG/ACT inhaler Inhale 2 puffs into the lungs every 4 (four) hours as needed. For shortness of breath and wheezing 18 g 3  . atorvastatin (LIPITOR) 20 MG tablet Take 1 tablet (20 mg total) by mouth daily. 90 tablet 1  . carbamazepine (TEGRETOL XR) 400 MG 12 hr tablet     . clorazepate (TRANXENE) 3.75 MG tablet Take 1 tablet (3.75 mg total) by mouth 2 (two) times daily as needed for anxiety. 90 tablet PRN  .  cyclobenzaprine (FLEXERIL) 10 MG tablet Take 1 tablet (10 mg total) by mouth 3 (three) times daily as needed. For muscle spasms 30 tablet 0  . DULoxetine (CYMBALTA) 20 MG capsule TAKE 1 CAPSULE BY MOUTH EVERY DAY 30 capsule 3  . lisinopril-hydrochlorothiazide (PRINZIDE,ZESTORETIC) 10-12.5 MG per tablet Take 1/2 tablet daily 45 tablet 1  . naproxen (NAPROSYN) 500 MG tablet Take 1 tablet (500 mg total) by mouth 2 (two) times daily with a meal. 60 tablet 3  . valACYclovir (VALTREX) 1000 MG tablet Take 1 tablet (1,000 mg total) by mouth 3 (three) times daily. 21 tablet 0   No current facility-administered medications on file prior to visit.    BP 128/114 mmHg  Pulse 93  Temp(Src) 98.3 F (36.8 C) (Oral)  Ht 5\' 2"  (1.575 m)  Wt 196 lb 6.4 oz (89.086 kg)  BMI 35.91 kg/m2  SpO2 98%        Objective:   Physical Exam  General- No acute distress. Pleasant patient. Neck- Full range of motion, no jvd Lungs- Clear, even and unlabored. Heart- regular rate and rhythm. Neurologic- CNII- XII grossly intact.  Skin- each wrist and ankle 1 small papule. Left forearm- mid aspect 2 papules.      Assessment & Plan:

## 2015-06-02 NOTE — Assessment & Plan Note (Signed)
Hx of when younger and now recurrent. Recent reoccurrence as before but no prior preceding suspicious exposures on review. Will rx potent steroid as she used before. Pt aware not to use on her face. If starts to worsen then refer to dermatologist.

## 2015-06-02 NOTE — Therapy (Addendum)
[facility] [address]  [place], [place], [address] Phone: [phone]   Fax:  [phone]  Physical Therapy Treatment  Patient Details  Name: Megan Duncan MRN: [id number] Date of Birth: [birth year] Referring Provider:  Midge Minium, MD  Encounter Date: 06/02/2015      PT End of Session - 06/02/15 0936    Visit Number 11   Number of Visits 19   Date for PT Re-Evaluation 07/01/15   PT Start Time 0935   PT Stop Time 1016   PT Time Calculation (min) 41 min      Past Medical History  Diagnosis Date  . Anxiety   . Depression   . LBP (low back pain)   . Bipolar II disorder     Dr. Charlott Holler  . Arthritis     hands  . Hypertension   . Diverticulosis   . Hyperlipidemia     Past Surgical History  Procedure Laterality Date  . Appendectomy    . Total abdominal hysterectomy    . Tonsillectomy    . Knee arthroscopy  2008    right    There were no vitals filed for this visit.  Visit Diagnosis:  Bilateral low back pain, with sciatica presence unspecified - Plan: PT plan of care cert/re-cert  Difficulty walking - Plan: PT plan of care cert/re-cert      Subjective Assessment - 06/02/15 0937    Subjective "hurting when I got up this AM", continued difficulty with car transfers, with prolonged standing, and with walking.  States some days she feels "pretty good" and able to go shopping but others no improvement. States pain no longer waking her at night. Has not noted N/T for past week.   How long can you stand comfortably? 5-10 minutes   How long can you walk comfortably? 10 minutes (continued difficulty with uphill), states still unable to comfortably walk from one airport gait to another   Currently in Pain? Yes   Pain Score 4    Pain Location Back   Pain Orientation Lower            [facility] PT Assessment - 06/02/15 0001    ROM / Strength   AROM / PROM / Strength Strength   Strength   Strength Assessment Site Hip   Right/Left Hip Right;Left   Right Hip Flexion 4+/5   Right Hip External Rotation  --  4/5 - no pain   Right Hip Internal Rotation  --  5/5   Right Hip ABduction 4+/5   Right Hip ADduction 5/5   Left Hip Flexion 4+/5   Left Hip External Rotation  --  4/5 - no pain   Left Hip Internal Rotation  --  5/5   Left Hip ABduction 4+/5   Left Hip ADduction 4+/5        TODAY'S TREATMENT B Hip MMT assessment  TherEx - Bridge 10x5" TRX Squat with black TB at knees 10x3" Doorway lunges 6x each (felt pulling in hip flexors) 6" BW Lunge stepdown 6x each (felt pulling in hip flexors) Reverse Curl-up 10x Side-Lying Clam Blue TB 10x Updated HEP instruction                     PT Education - 06/02/15 1133    Education provided Yes   Education Details hep update   Person(s) Educated Patient   Methods Explanation;Demonstration;Handout   Comprehension Verbalized understanding;Returned demonstration  PT Short Term Goals - 04/28/15 1144    PT SHORT TERM GOAL #1   Title pt independent with initial HEP by 04/16/15   Status Achieved           PT Long Term Goals - 06/02/15 1134    PT LONG TERM GOAL #1   Title pt independent with advanced HEP for continued progress by 07/01/15   Status On-going   PT LONG TERM GOAL #2   Title pt able to stand 20 minutes or greater to allow cooking without limit by pain by 07/01/15   Status On-going   PT LONG TERM GOAL #3   Title pt able to walk 30 minutes to allow shopping and walking within airport without limitation by pain by 07/01/15   Status On-going   PT LONG TERM GOAL #4   Title B LE MMT 4+/5 or better grossly by 07/01/15  all planes 4+/5 or better other than B Hip ER 4/5 no pain and B Hip Extension not re-assessed today due to pain with this.   Status Partially Met               Plan - 06/02/15 1138    Clinical Impression Statement pt notes intermittent progress since beginning  PT but not consistent enough to notice increased walking tolerance to point that she can walk within airport without limit by pain.  She is motivated with PT and displays some improvement with Hip MMT but still limited into ER and Extension (extension due to pain).  Her standing and walking tolerance fluctuate with pt stating there are days she feels okay with grocery shopping but not on others.  Current s/s seem lower lumbar facet in nature with possible SI involvement.  Pt wants to continue with PT attempting to improve standing/walking tolerance.    Pt will benefit from skilled therapeutic intervention in order to improve on the following deficits Postural dysfunction;Pain;Improper body mechanics;Difficulty walking;Impaired flexibility;Decreased strength   PT Frequency 2x / week   PT Duration 4 weeks   PT Treatment/Interventions Therapeutic exercise;Therapeutic activities;Patient/family education;Functional mobility training;ADLs/Self Care Home Management;Electrical Stimulation;Neuromuscular re-education;Manual techniques;Traction;Ultrasound   PT Next Visit Plan Continue lumbopelvic stability as able with more standing exercises; modalities PRN; dry needling next time seen by PT; return to mechanical traction with slight flexion pull   Consulted and Agree with Plan of Care Patient        Problem List Patient Active Problem List   Diagnosis Date Noted  . Dermatitis 06/02/2015  . Shingles outbreak 04/14/2015  . Hyperlipidemia 01/13/2015  . Fibromyalgia 10/14/2014  . Bilateral hip pain 09/02/2014  . 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 Routine general medical examination at a health care facility 07/15/2014  . Ceruminosis 03/04/2014  . Tinnitus 03/04/2014  . Torticollis 03/04/2014  . Bipolar disorder with moderate depression 12/03/2013  . Sinusitis, acute maxillary 09/14/2013  . Low back pain 08/06/2012  . Lower abdominal pain 08/06/2012  . Physical exam, annual 06/17/2012  . Abscess  04/11/2011  . URI 09/19/2010  . OTITIS MEDIA 09/05/2010  . OBESITY 07/20/2010  . PERIPHERAL NEUROPATHY 02/22/2010  . Lumbar back pain with radiculopathy affecting left lower extremity 01/24/2010  . ABSCESS, SUPRAPUBIC 09/15/2009  . COCCYGEAL PAIN 12/14/2008  . SKIN LESION 11/24/2008  . NECK PAIN, CHRONIC 06/20/2007  . BACK PAIN, LUMBAR 06/20/2007  . ANXIETY 04/23/2007  . Essential hypertension 04/23/2007  . ENDOMETRITIS 04/23/2007    Easton Sivertson PT, OCS 06/02/2015, 11:47 AM  [facility] [address]  [place], [place], [address] Phone: [phone]   Fax:  [phone]    PHYSICAL THERAPY DISCHARGE SUMMARY  Visits from Start of Care: 11  Current functional level related to goals / functional outcomes: unknown   Remaining deficits: LBP and limited walking tolerance   Education / Equipment: HEP Plan: Patient agrees to discharge.  Patient goals were not met. Patient is being discharged due to not returning since the last visit.  ?????        Pt last seen on 06/02/15 and hasn't returned since that time and is therefore being discharged from our care.  Leonette Most PT, OCS 07/15/2015 9:26 AM

## 2015-06-02 NOTE — Patient Instructions (Signed)
Dermatitis Hx of when younger and now recurrent. Recent reoccurrence as before but no prior preceding suspicious exposures on review. Will rx potent steroid as she used before. Pt aware not to use on her face. If starts to worsen then refer to dermatologist.    Follow up prn if rash perists or worsens

## 2015-06-06 ENCOUNTER — Other Ambulatory Visit: Payer: Self-pay | Admitting: Family Medicine

## 2015-06-10 ENCOUNTER — Ambulatory Visit: Payer: Medicare Other | Admitting: Physical Therapy

## 2015-06-13 ENCOUNTER — Ambulatory Visit: Payer: Medicare Other | Admitting: Physical Therapy

## 2015-06-14 ENCOUNTER — Telehealth: Payer: Self-pay | Admitting: Family Medicine

## 2015-06-14 MED ORDER — NAPROXEN 500 MG PO TABS: 500.0000 mg | ORAL_TABLET | Freq: Two times a day (BID) | ORAL | Status: DC

## 2015-06-14 NOTE — Telephone Encounter (Signed)
Caller name:Savonna Relationship to patient:SELF Can be [phone] Pharmacy:[facility] [address]  Reason for call:SHE GOT AN RX IN June FOR NAPROXEN 500MG   SHE HAS NO REFILLS  COULD SHE HAVE A REFILL

## 2015-06-14 NOTE — Telephone Encounter (Signed)
Medication filled to pharmacy as requested.   

## 2015-06-16 DIAGNOSIS — F3181 Bipolar II disorder: Secondary | ICD-10-CM | POA: Diagnosis not present

## 2015-06-25 ENCOUNTER — Other Ambulatory Visit: Payer: Self-pay | Admitting: Family Medicine

## 2015-06-27 NOTE — Telephone Encounter (Signed)
Medication filled to pharmacy as requested.   

## 2015-07-12 ENCOUNTER — Telehealth: Payer: Self-pay | Admitting: Family Medicine

## 2015-07-12 MED ORDER — ATORVASTATIN CALCIUM 20 MG PO TABS: 20.0000 mg | ORAL_TABLET | Freq: Every day | ORAL | Status: DC

## 2015-07-12 NOTE — Telephone Encounter (Signed)
Relation to KG:YJEH Call back [place]: [place], [place] [phone] (Phone) [phone] (Fax)         Reason for call:  Patient requesting a refill atorvastatin (LIPITOR) 20 MG tablet

## 2015-07-12 NOTE — Telephone Encounter (Signed)
Medication filled to pharmacy as requested.   

## 2015-07-22 ENCOUNTER — Other Ambulatory Visit: Payer: Self-pay | Admitting: Family Medicine

## 2015-07-22 NOTE — Telephone Encounter (Signed)
Medication filled to pharmacy as requested.   

## 2015-08-01 ENCOUNTER — Encounter: Payer: Self-pay | Admitting: Family Medicine

## 2015-08-01 ENCOUNTER — Ambulatory Visit (INDEPENDENT_AMBULATORY_CARE_PROVIDER_SITE_OTHER): Payer: Medicare Other | Admitting: Family Medicine

## 2015-08-01 ENCOUNTER — Encounter: Payer: Self-pay | Admitting: General Practice

## 2015-08-01 VITALS — BP 126/78 | HR 83 | Temp 98.2°F | Resp 16 | Ht 62.0 in | Wt 196.5 lb

## 2015-08-01 DIAGNOSIS — I10 Essential (primary) hypertension: Secondary | ICD-10-CM | POA: Diagnosis not present

## 2015-08-01 DIAGNOSIS — N393 Stress incontinence (female) (male): Secondary | ICD-10-CM

## 2015-08-01 DIAGNOSIS — F3132 Bipolar disorder, current episode depressed, moderate: Secondary | ICD-10-CM

## 2015-08-01 DIAGNOSIS — E669 Obesity, unspecified: Secondary | ICD-10-CM | POA: Diagnosis not present

## 2015-08-01 DIAGNOSIS — E785 Hyperlipidemia, unspecified: Secondary | ICD-10-CM

## 2015-08-01 DIAGNOSIS — Z23 Encounter for immunization: Secondary | ICD-10-CM | POA: Diagnosis not present

## 2015-08-01 DIAGNOSIS — Z780 Asymptomatic menopausal state: Secondary | ICD-10-CM

## 2015-08-01 DIAGNOSIS — Z0000 Encounter for general adult medical examination without abnormal findings: Secondary | ICD-10-CM

## 2015-08-01 DIAGNOSIS — K648 Other hemorrhoids: Secondary | ICD-10-CM

## 2015-08-01 DIAGNOSIS — Z1231 Encounter for screening mammogram for malignant neoplasm of breast: Secondary | ICD-10-CM

## 2015-08-01 DIAGNOSIS — K644 Residual hemorrhoidal skin tags: Secondary | ICD-10-CM | POA: Insufficient documentation

## 2015-08-01 DIAGNOSIS — D126 Benign neoplasm of colon, unspecified: Secondary | ICD-10-CM

## 2015-08-01 HISTORY — DX: Stress incontinence (female) (male): N39.3

## 2015-08-01 HISTORY — DX: Residual hemorrhoidal skin tags: K64.4

## 2015-08-01 LAB — CBC WITH DIFFERENTIAL/PLATELET
BASOS ABS: 0 10*3/uL (ref 0.0–0.1)
BASOS PCT: 0.5 % (ref 0.0–3.0)
EOS ABS: 0.1 10*3/uL (ref 0.0–0.7)
Eosinophils Relative: 1.4 % (ref 0.0–5.0)
HEMATOCRIT: 44.3 % (ref 36.0–46.0)
Hemoglobin: 14.6 g/dL (ref 12.0–15.0)
LYMPHS ABS: 2.4 10*3/uL (ref 0.7–4.0)
LYMPHS PCT: 35.8 % (ref 12.0–46.0)
MCHC: 33 g/dL (ref 30.0–36.0)
MCV: 96.4 fl (ref 78.0–100.0)
MONO ABS: 0.6 10*3/uL (ref 0.1–1.0)
Monocytes Relative: 9 % (ref 3.0–12.0)
NEUTROS ABS: 3.6 10*3/uL (ref 1.4–7.7)
NEUTROS PCT: 53.3 % (ref 43.0–77.0)
PLATELETS: 304 10*3/uL (ref 150.0–400.0)
RBC: 4.6 Mil/uL (ref 3.87–5.11)
RDW: 13.3 % (ref 11.5–15.5)
WBC: 6.7 10*3/uL (ref 4.0–10.5)

## 2015-08-01 LAB — LIPID PANEL
CHOLESTEROL: 198 mg/dL (ref 0–200)
HDL: 81.5 mg/dL (ref >39.00)
LDL Cholesterol: 98 mg/dL (ref 0–99)
NONHDL: 116.33
Total CHOL/HDL Ratio: 2
Triglycerides: 91 mg/dL (ref 0.0–149.0)
VLDL: 18.2 mg/dL (ref 0.0–40.0)

## 2015-08-01 LAB — HEPATIC FUNCTION PANEL
ALK PHOS: 59 U/L (ref 39–117)
ALT: 16 U/L (ref 0–35)
AST: 20 U/L (ref 0–37)
Albumin: 4.5 g/dL (ref 3.5–5.2)
BILIRUBIN DIRECT: 0.1 mg/dL (ref 0.0–0.3)
BILIRUBIN TOTAL: 0.3 mg/dL (ref 0.2–1.2)
TOTAL PROTEIN: 7.6 g/dL (ref 6.0–8.3)

## 2015-08-01 LAB — BASIC METABOLIC PANEL
BUN: 21 mg/dL (ref 6–23)
CALCIUM: 9.4 mg/dL (ref 8.4–10.5)
CHLORIDE: 103 meq/L (ref 96–112)
CO2: 30 meq/L (ref 19–32)
Creatinine, Ser: 0.76 mg/dL (ref 0.40–1.20)
GFR: 80.14 mL/min (ref >60.00)
GLUCOSE: 105 mg/dL — AB (ref 70–99)
Potassium: 4.3 mEq/L (ref 3.5–5.1)
SODIUM: 141 meq/L (ref 135–145)

## 2015-08-01 LAB — TSH: TSH: 2.39 u[IU]/mL (ref 0.35–4.50)

## 2015-08-01 MED ORDER — HYDROCORTISONE ACE-PRAMOXINE 2.5-1 % RE CREA: 1.0000 "application " | TOPICAL_CREAM | Freq: Three times a day (TID) | RECTAL | Status: DC

## 2015-08-01 MED ORDER — ATORVASTATIN CALCIUM 20 MG PO TABS: 20.0000 mg | ORAL_TABLET | Freq: Every day | ORAL | Status: DC

## 2015-08-01 MED ORDER — DULOXETINE HCL 20 MG PO CPEP: ORAL_CAPSULE | ORAL | Status: DC

## 2015-08-01 MED ORDER — CLORAZEPATE DIPOTASSIUM 3.75 MG PO TABS: 3.7500 mg | ORAL_TABLET | Freq: Two times a day (BID) | ORAL | Status: DC | PRN

## 2015-08-01 MED ORDER — LISINOPRIL-HYDROCHLOROTHIAZIDE 10-12.5 MG PO TABS: 0.5000 | ORAL_TABLET | Freq: Every day | ORAL | Status: DC

## 2015-08-01 NOTE — Progress Notes (Signed)
Pre visit review using our clinic review tool, if applicable. No additional management support is needed unless otherwise documented below in the visit note. 

## 2015-08-01 NOTE — Progress Notes (Signed)
   Subjective:    Patient ID: Megan Duncan, female    DOB: Jun 03, 1946, 69 y.o.   MRN: [id number]  HPI Here today for CPE.  Risk Factors: HTN- chronic problem, on Lisinopril-HCTZ daily w/ good control.  Denies CP, SOB, HAs, visual changes, edema. Hyperlipidemia- chronic problem.  On Lipitor nightly.  Denies abd pain, N/V.  Pt has never had carotid arteries assessed Physical Activity: limited due to fibromyalgia and back pain Fall Risk: low Depression: chronic problem, pt has known Bipolar and following w/ Dr Charlott Holler.  Currently sxs are well controlled. Hearing: normal to conversational tones, mildly decreased to whispered voice ADL's: independent Cognitive: normal linear thought process, memory and attention intact Home Safety: safe at home, lives w/ husband Height, Weight, BMI, Visual Acuity: see vitals, vision corrected to 20/20 w/ glasses Counseling: due for 3 year colonoscopy, due for mammo, DEXA.  Pt to get pneumovax and flu today Care team reviewed and updated Labs Ordered: See A&P Care Plan: See A&P    Review of Systems Patient reports no vision/ hearing changes, adenopathy,fever, weight change,  persistant/recurrent hoarseness, swallowing issues, chest pain, palpitations, edema, hemoptysis, dyspnea (rest/exertional/paroxysmal nocturnal), gastrointestinal bleeding (melena, rectal bleeding), abdominal pain, significant heartburn, bowel changes, Gyn symptoms (abnormal  bleeding, pain),  syncope, focal weakness, memory loss, numbness & tingling, skin/hair/nail changes, abnormal bruising or bleeding, anxiety, or depression.   + urinary incontinence- occuring w/ laugh, cough + cough- sxs started ~1 yr ago.  Intermittent.  Worse when she has PND + sweating- occurs intermittently.  Not related to exertion.  Some are mild and some are more severe. + hemorrhoid    Objective:   Physical Exam General Appearance:    Alert, cooperative, no distress, appears stated age, obese  Head:     Normocephalic, without obvious abnormality, atraumatic  Eyes:    PERRL, conjunctiva/corneas clear, EOM's intact, fundi    benign, both eyes  Ears:    Normal TM's and external ear canals, both ears  Nose:   Nares normal, septum midline, mucosa normal, no drainage    or sinus tenderness  Throat:   Lips, mucosa, and tongue normal; teeth and gums normal  Neck:   Supple, symmetrical, trachea midline, no adenopathy;    Thyroid: no enlargement/tenderness/nodules  Back:     Symmetric, no curvature, ROM normal, no CVA tenderness  Lungs:     Clear to auscultation bilaterally, respirations unlabored  Chest Wall:    No tenderness or deformity   Heart:    Regular rate and rhythm, S1 and S2 normal, no murmur, rub   or gallop  Breast Exam:    Deferred to mammo  Abdomen:     Soft, non-tender, bowel sounds active all four quadrants,    no masses, no organomegaly  Genitalia:    Deferred  Rectal:    Extremities:   Extremities normal, atraumatic, no cyanosis or edema  Pulses:   2+ and symmetric all extremities  Skin:   Skin color, texture, turgor normal, no rashes or lesions  Lymph nodes:   Cervical, supraclavicular, and axillary nodes normal  Neurologic:   CNII-XII intact, normal strength, sensation and reflexes    throughout          Assessment & Plan:

## 2015-08-01 NOTE — Patient Instructions (Signed)
Follow up in 6 months to recheck BP and cholesterol We'll notify you of your lab results and make any changes if needed Continue to work on healthy diet and regular exercise We'll call you with your GI appt for the repeat colonoscopy We'll call you with your mammo and bone density appts- if you don't hear in the next week, let me know! Use the Analpram as needed for the hemorrhoid Start daily Zyrtec (store brand generic is just as good!) daily for the cough- if no improvement in the next 2 weeks, let me know so we can get a chest xray We'll call you with your carotid dopplers (fingers crossed insurance approves!) Monitor the urine situation.  If it worsens, let me know so we can send you to urology Call with any questions or concerns If you want to join [name] at the new [place] office, any scheduled appointments will automatically transfer and we will see you at [address] [name] [address] [place], [place] [address]  Have a great fall!!!

## 2015-08-09 ENCOUNTER — Other Ambulatory Visit (HOSPITAL_BASED_OUTPATIENT_CLINIC_OR_DEPARTMENT_OTHER): Payer: Medicare Other

## 2015-08-10 ENCOUNTER — Telehealth: Payer: Self-pay | Admitting: Family Medicine

## 2015-08-10 MED ORDER — DULOXETINE HCL 20 MG PO CPEP: ORAL_CAPSULE | ORAL | Status: DC

## 2015-08-10 NOTE — Telephone Encounter (Signed)
Pt has had issues with DULoxetine (CYMBALTA) 20 MG capsule being sent from [facility]. They are going to ship and she should have in 5-10 days. Can we send a partial RX in for 10 days to local pharmacy: [facility] on [address]. She asked if we can please send in today. She is out.

## 2015-08-10 NOTE — Telephone Encounter (Signed)
Med filled.  

## 2015-08-12 NOTE — Assessment & Plan Note (Signed)
Chronic problem.  Tolerating statin w/o difficulty.  Check labs.  Adjust meds prn  

## 2015-08-12 NOTE — Assessment & Plan Note (Signed)
New.  Start Analpram prn.  Reviewed supportive care and red flags that should prompt return.  Pt expressed understanding and is in agreement w/ plan.

## 2015-08-12 NOTE — Assessment & Plan Note (Addendum)
New.  Pt is not interested in work up at this time.  Discussed medication and urology referral but pt declined.  Pt to void regularly and work on [employer].  She will report if she desires additional intervention.  Pt expressed understanding and is in agreement w/ plan.

## 2015-08-12 NOTE — Assessment & Plan Note (Signed)
Pt's PE unchanged from previous.  Due for colonoscopy, mammo, DEXA.  Orders entered.  Written screening schedule updated and given to pt.  Immunizations updated.  Check labs.  Anticipatory guidance provided.

## 2015-08-12 NOTE — Assessment & Plan Note (Signed)
Pt is following w/ Psych ([place]).  Currently doing well.  Will continue to follow and assist as able.

## 2015-08-12 NOTE — Assessment & Plan Note (Signed)
Chronic problem.  Discussed need for healthy diet and regular exercise.  Check labs to risk stratify.  Will continue to follow.

## 2015-08-12 NOTE — Assessment & Plan Note (Signed)
Chronic problem.  Well controlled.  Asymptomatic.  Check labs.  No anticipated med changes.  Will follow. 

## 2015-08-16 ENCOUNTER — Other Ambulatory Visit (HOSPITAL_BASED_OUTPATIENT_CLINIC_OR_DEPARTMENT_OTHER): Payer: Medicare Other

## 2015-08-16 DIAGNOSIS — F3181 Bipolar II disorder: Secondary | ICD-10-CM | POA: Diagnosis not present

## 2015-08-23 ENCOUNTER — Ambulatory Visit (HOSPITAL_BASED_OUTPATIENT_CLINIC_OR_DEPARTMENT_OTHER)
Admission: RE | Admit: 2015-08-23 | Discharge: 2015-08-23 | Disposition: A | Discharge status: Home / Self Care | Payer: Medicare Other | Source: Ambulatory Visit | Attending: Family Medicine | Admitting: Family Medicine

## 2015-08-23 DIAGNOSIS — Z780 Asymptomatic menopausal state: Secondary | ICD-10-CM

## 2015-08-23 DIAGNOSIS — Z87891 Personal history of nicotine dependence: Secondary | ICD-10-CM | POA: Diagnosis not present

## 2015-08-23 DIAGNOSIS — Z1231 Encounter for screening mammogram for malignant neoplasm of breast: Secondary | ICD-10-CM

## 2015-08-23 DIAGNOSIS — E785 Hyperlipidemia, unspecified: Secondary | ICD-10-CM | POA: Diagnosis not present

## 2015-08-23 DIAGNOSIS — I6523 Occlusion and stenosis of bilateral carotid arteries: Secondary | ICD-10-CM | POA: Diagnosis not present

## 2015-08-23 DIAGNOSIS — I10 Essential (primary) hypertension: Secondary | ICD-10-CM | POA: Insufficient documentation

## 2015-08-24 ENCOUNTER — Encounter: Payer: Self-pay | Admitting: General Practice

## 2015-08-29 ENCOUNTER — Encounter: Payer: Self-pay | Admitting: Gastroenterology

## 2015-09-06 ENCOUNTER — Other Ambulatory Visit: Payer: Self-pay | Admitting: Family Medicine

## 2015-09-06 NOTE — Telephone Encounter (Signed)
Medication filled to pharmacy as requested.   

## 2015-09-12 ENCOUNTER — Telehealth: Payer: Self-pay | Admitting: Family Medicine

## 2015-09-12 NOTE — Telephone Encounter (Signed)
Pt informed that rx was filled on 09-06-15.

## 2015-09-12 NOTE — Telephone Encounter (Signed)
Caller name: Azjah   Relationship to patient: Self   Can be reached: [phone]  Pharmacy: [id number] - [place], [address]  Reason for call: Pt is requesting a refill on her naproxen Rx. Pt says that she only have 2 pills left.

## 2015-10-14 ENCOUNTER — Telehealth: Payer: Self-pay | Admitting: Family Medicine

## 2015-10-14 ENCOUNTER — Encounter: Payer: Self-pay | Admitting: Medical

## 2015-10-14 ENCOUNTER — Ambulatory Visit (INDEPENDENT_AMBULATORY_CARE_PROVIDER_SITE_OTHER): Payer: Medicare Other | Admitting: Medical

## 2015-10-14 VITALS — BP 105/70 | HR 90 | Temp 98.0°F | Wt 196.0 lb

## 2015-10-14 DIAGNOSIS — R1013 Epigastric pain: Secondary | ICD-10-CM

## 2015-10-14 LAB — COMPREHENSIVE METABOLIC PANEL
ALT: 1162 U/L — ABNORMAL HIGH (ref 6–29)
AST: 951 U/L — AB (ref 10–35)
Albumin: 4.6 g/dL (ref 3.6–5.1)
Alkaline Phosphatase: 190 U/L — ABNORMAL HIGH (ref 33–130)
BILIRUBIN TOTAL: 0.9 mg/dL (ref 0.2–1.2)
BUN: 13 mg/dL (ref 7–25)
CHLORIDE: 100 mmol/L (ref 98–110)
CO2: 28 mmol/L (ref 20–31)
CREATININE: 0.7 mg/dL (ref 0.50–0.99)
Calcium: 9.7 mg/dL (ref 8.6–10.4)
GLUCOSE: 135 mg/dL — AB (ref 65–99)
Potassium: 4.2 mmol/L (ref 3.5–5.3)
SODIUM: 139 mmol/L (ref 135–146)
Total Protein: 7.6 g/dL (ref 6.1–8.1)

## 2015-10-14 LAB — CBC WITH DIFFERENTIAL/PLATELET
BASOS PCT: 1 % (ref 0–1)
Basophils Absolute: 0.1 10*3/uL (ref 0.0–0.1)
EOS ABS: 0.1 10*3/uL (ref 0.0–0.7)
EOS PCT: 2 % (ref 0–5)
HCT: 46.1 % — ABNORMAL HIGH (ref 36.0–46.0)
Hemoglobin: 15.8 g/dL — ABNORMAL HIGH (ref 12.0–15.0)
Lymphocytes Relative: 26 % (ref 12–46)
Lymphs Abs: 1.7 10*3/uL (ref 0.7–4.0)
MCH: 32 pg (ref 26.0–34.0)
MCHC: 34.3 g/dL (ref 30.0–36.0)
MCV: 93.3 fL (ref 78.0–100.0)
MONO ABS: 0.9 10*3/uL (ref 0.1–1.0)
MPV: 10.1 fL (ref 8.6–12.4)
Monocytes Relative: 14 % — ABNORMAL HIGH (ref 3–12)
NEUTROS ABS: 3.7 10*3/uL (ref 1.7–7.7)
Neutrophils Relative %: 57 % (ref 43–77)
PLATELETS: 303 10*3/uL (ref 150–400)
RBC: 4.94 MIL/uL (ref 3.87–5.11)
RDW: 13.7 % (ref 11.5–15.5)
WBC: 6.5 10*3/uL (ref 4.0–10.5)

## 2015-10-14 LAB — AMYLASE: Amylase: 64 U/L (ref 0–105)

## 2015-10-14 LAB — LIPASE: Lipase: 32 U/L (ref 7–60)

## 2015-10-14 MED ORDER — RANITIDINE HCL 150 MG PO CAPS: 150.0000 mg | ORAL_CAPSULE | Freq: Two times a day (BID) | ORAL | Status: DC

## 2015-10-14 MED ORDER — GI COCKTAIL ~~LOC~~
30.0000 mL | Freq: Once | ORAL | Status: AC
  Administered 2015-10-14: 30 mL via ORAL

## 2015-10-14 NOTE — Telephone Encounter (Signed)
Patient called in states she feels she is having reaction to Naproxen although she has been taking for several months. States she has been nausus for over a week,no vomiting yet gaging. A lot of burping, bloated in the a.m. States sometimes her urine is reddish.  States sometimes the smell of things makes her sick. Patient refused to accept appointment until JB spoke with her and stated she needed to come in in order to be referred.Patient agreed to come.

## 2015-10-14 NOTE — Patient Instructions (Signed)
Your abdomen pain may represent gerd. We gave Gi cocktail in office. Start ranitidine over the weekend and eat healthy.  Will get cbc, cmp amylase and lipase today.  Hydrate well.  If pain worsens or changes then ED evaluation.  Follow up in 7 days or as needed

## 2015-10-14 NOTE — Telephone Encounter (Signed)
Can you please triage pt and find out what symptoms she is having?

## 2015-10-14 NOTE — Progress Notes (Signed)
Pre visit review using our clinic review tool, if applicable. No additional management support is needed unless otherwise documented below in the visit note. 

## 2015-10-14 NOTE — Telephone Encounter (Signed)
Caller name: Self   Can be reached: [phone]   Reason for call: Patient thinks that naproxen (NAPROSYN) 500 MG tablet [id number] is making her sick. States that she stop taking it yesterday but still feels bad this morning

## 2015-10-14 NOTE — Progress Notes (Signed)
Subjective:    Patient ID: Megan Duncan, female    DOB: Jan 26, 1946, 69 y.o.   MRN: [id number]  HPI  Pt in states some nausea since last wed. Pt does belch. Feels like if would vomit would feel better. No loss appetite. Pt last bm today and was normal. She usually will go daily. Pt had no bm today. But no coffee today and did not eat much. Sensitive to smell.   No diarrhea.   Pt has been taking naprosyn for 6 months or more. Pt has no black looking stools. Pt has alread stopped naprosyn for 2 days.  In am her urine is concentrated.   Pt denies hx of gerd. She belches mid exam.   Review of Systems  Constitutional: Negative for fever, chills and fatigue.  HENT: Negative for congestion and ear pain.   Respiratory: Negative for cough, choking, shortness of breath and wheezing.   Cardiovascular: Negative for chest pain and palpitations.  Gastrointestinal: Positive for nausea and abdominal pain. Negative for vomiting, diarrhea, constipation, blood in stool, anal bleeding and rectal pain.  Genitourinary: Negative for dysuria and flank pain.  Musculoskeletal: Negative for back pain.  Neurological: Negative for dizziness and headaches.  Hematological: Negative for adenopathy. Does not bruise/bleed easily.   Past Medical History  Diagnosis Date  . Anxiety   . Depression   . LBP (low back pain)   . Bipolar II disorder ([place])     Dr. Charlott Holler  . Arthritis     hands  . Hypertension   . Diverticulosis   . Hyperlipidemia     Social History   Social History  . Marital Status: Married    Spouse Name: N/A  . Number of Children: N/A  . Years of Education: N/A   Occupational History  . Not on file.   Social History Main Topics  . Smoking status: Former Smoker    Quit date: 07/02/2004  . Smokeless tobacco: Never Used  . Alcohol Use: Yes     Comment: occasional wine  . Drug Use: No  . Sexual Activity: Not on file   Other Topics Concern  . Not on file   Social History  Narrative   Lives with husband and grandson she is raising.    Past Surgical History  Procedure Laterality Date  . Appendectomy    . Total abdominal hysterectomy    . Tonsillectomy    . Knee arthroscopy  2008    right    Family History  Problem Relation Age of Onset  . Diabetes Father   . Hypertension Father   . Hypertension Mother   . Mental illness Sister     suicide  . Colon cancer Maternal Aunt 80  . Heart attack Father     Allergies  Allergen Reactions  . Codeine Nausea And Vomiting  . Diclofenac Other (See Comments)    Elevated LFT's  . Doxycycline     Extreme nausea  . Sulfonamide Derivatives Swelling    Current Outpatient Prescriptions on File Prior to Visit  Medication Sig Dispense Refill  . albuterol (VENTOLIN HFA) 108 (90 BASE) MCG/ACT inhaler Inhale 2 puffs into the lungs every 4 (four) hours as needed. For shortness of breath and wheezing 18 g 3  . atorvastatin (LIPITOR) 20 MG tablet Take 1 tablet (20 mg total) by mouth daily. 90 tablet 1  . carbamazepine (TEGRETOL XR) 400 MG 12 hr tablet     . clorazepate (TRANXENE) 3.75 MG tablet Take 1 tablet (  3.75 mg total) by mouth 2 (two) times daily as needed for anxiety. 90 tablet 1  . cyclobenzaprine (FLEXERIL) 10 MG tablet Take 1 tablet (10 mg total) by mouth 3 (three) times daily as needed. For muscle spasms 30 tablet 0  . DULoxetine (CYMBALTA) 20 MG capsule TAKE 1 CAPSULE BY MOUTH EVERY DAY 30 capsule 0  . fluocinonide cream (LIDEX) AB-123456789 % Apply 1 application topically 2 (two) times daily. 60 g 0  . hydrocortisone-pramoxine (ANALPRAM HC) 2.5-1 % rectal cream Place 1 application rectally 3 (three) times daily. 30 g 3  . lisinopril-hydrochlorothiazide (PRINZIDE,ZESTORETIC) 10-12.5 MG per tablet Take 0.5 tablets by mouth daily. 45 tablet 1  . naproxen (NAPROSYN) 500 MG tablet TAKE 1 TABLET BY MOUTH TWICE DAILY WITH MEALS 60 tablet 3  . valACYclovir (VALTREX) 1000 MG tablet Take 1 tablet (1,000 mg total) by mouth 3  (three) times daily. 21 tablet 0   No current facility-administered medications on file prior to visit.    BP 105/70 mmHg  Pulse 90  Temp(Src) 98 F (36.7 C)  Wt 196 lb (88.905 kg)  SpO2 99%   .      Objective:   Physical Exam  General Appearance- Not in acute distress.  HEENT Eyes- Scleraeral/Conjuntiva-bilat- Not Yellow. Mouth & Throat- Normal.  Chest and Lung Exam Auscultation: Breath sounds:-Normal. CTA. Adventitious sounds:- No Adventitious sounds.  Cardiovascular Auscultation:Rythm - Regular. Heart Sounds -Normal heart sounds.  Abdomen Inspection:-Inspection Normal.  Palpation/Perucssion: Palpation and Percussion of the abdomen reveal- Non Tender, No Rebound tenderness, No rigidity(Guarding) and No Palpable abdominal masses.  Liver:-Normal.  Spleen:- Normal.        Assessment & Plan:  Your abdomen pain may represent gerd. We gave Gi cocktail in office.(spme improvement after about 10 minutes) Start ranitidine over the weekend and eat healthy.  Will get cbc, cmp amylase and lipase today.  Hydrate well.  If pain worsens or changes then ED evaluation.  Follow up in 7 days or as needed

## 2015-10-16 ENCOUNTER — Telehealth: Payer: Self-pay | Admitting: Medical

## 2015-10-16 NOTE — Telephone Encounter (Signed)
Please the lab review note for labs done on Friday  then call the patient. Very important.

## 2015-10-17 NOTE — Telephone Encounter (Signed)
Patient has been notified about results and also has an appointment for 10/26/15 with Mackie Pai.

## 2015-10-18 ENCOUNTER — Ambulatory Visit (HOSPITAL_BASED_OUTPATIENT_CLINIC_OR_DEPARTMENT_OTHER)
Admission: RE | Admit: 2015-10-18 | Discharge: 2015-10-18 | Disposition: A | Discharge status: Home / Self Care | Payer: Medicare Other | Source: Ambulatory Visit | Attending: Medical | Admitting: Medical

## 2015-10-18 ENCOUNTER — Encounter: Payer: Self-pay | Admitting: Medical

## 2015-10-18 ENCOUNTER — Ambulatory Visit (INDEPENDENT_AMBULATORY_CARE_PROVIDER_SITE_OTHER): Payer: Medicare Other | Admitting: Medical

## 2015-10-18 VITALS — BP 106/72 | HR 90 | Temp 98.0°F | Ht 62.0 in | Wt 196.6 lb

## 2015-10-18 DIAGNOSIS — R140 Abdominal distension (gaseous): Secondary | ICD-10-CM | POA: Insufficient documentation

## 2015-10-18 DIAGNOSIS — R1013 Epigastric pain: Secondary | ICD-10-CM | POA: Diagnosis not present

## 2015-10-18 DIAGNOSIS — R748 Abnormal levels of other serum enzymes: Secondary | ICD-10-CM | POA: Diagnosis not present

## 2015-10-18 DIAGNOSIS — K760 Fatty (change of) liver, not elsewhere classified: Secondary | ICD-10-CM | POA: Diagnosis not present

## 2015-10-18 DIAGNOSIS — R7989 Other specified abnormal findings of blood chemistry: Secondary | ICD-10-CM | POA: Insufficient documentation

## 2015-10-18 DIAGNOSIS — R110 Nausea: Secondary | ICD-10-CM

## 2015-10-18 DIAGNOSIS — N281 Cyst of kidney, acquired: Secondary | ICD-10-CM | POA: Insufficient documentation

## 2015-10-18 LAB — COMPREHENSIVE METABOLIC PANEL
ALBUMIN: 4.2 g/dL (ref 3.5–5.2)
ALT: 958 U/L — ABNORMAL HIGH (ref 0–35)
AST: 670 U/L — ABNORMAL HIGH (ref 0–37)
Alkaline Phosphatase: 188 U/L — ABNORMAL HIGH (ref 39–117)
BUN: 12 mg/dL (ref 6–23)
CALCIUM: 9.9 mg/dL (ref 8.4–10.5)
CHLORIDE: 99 meq/L (ref 96–112)
CO2: 32 meq/L (ref 19–32)
CREATININE: 0.82 mg/dL (ref 0.40–1.20)
GFR: 73.37 mL/min (ref >60.00)
Glucose, Bld: 125 mg/dL — ABNORMAL HIGH (ref 70–99)
POTASSIUM: 3.6 meq/L (ref 3.5–5.1)
Sodium: 139 mEq/L (ref 135–145)
Total Bilirubin: 0.8 mg/dL (ref 0.2–1.2)
Total Protein: 7.9 g/dL (ref 6.0–8.3)

## 2015-10-18 LAB — CBC WITH DIFFERENTIAL/PLATELET
BASOS PCT: 0.7 % (ref 0.0–3.0)
Basophils Absolute: 0 10*3/uL (ref 0.0–0.1)
EOS PCT: 1.4 % (ref 0.0–5.0)
Eosinophils Absolute: 0.1 10*3/uL (ref 0.0–0.7)
HEMATOCRIT: 47 % — AB (ref 36.0–46.0)
HEMOGLOBIN: 15.5 g/dL — AB (ref 12.0–15.0)
LYMPHS PCT: 27.4 % (ref 12.0–46.0)
Lymphs Abs: 1.9 10*3/uL (ref 0.7–4.0)
MCHC: 32.9 g/dL (ref 30.0–36.0)
MCV: 94.8 fl (ref 78.0–100.0)
Monocytes Absolute: 1 10*3/uL (ref 0.1–1.0)
Monocytes Relative: 14 % — ABNORMAL HIGH (ref 3.0–12.0)
NEUTROS ABS: 3.9 10*3/uL (ref 1.4–7.7)
Neutrophils Relative %: 56.5 % (ref 43.0–77.0)
PLATELETS: 302 10*3/uL (ref 150.0–400.0)
RBC: 4.97 Mil/uL (ref 3.87–5.11)
RDW: 13.6 % (ref 11.5–15.5)
WBC: 6.9 10*3/uL (ref 4.0–10.5)

## 2015-10-18 LAB — LIPASE: LIPASE: 24 U/L (ref 11.0–59.0)

## 2015-10-18 LAB — AMYLASE: AMYLASE: 48 U/L (ref 27–131)

## 2015-10-18 MED ORDER — ONDANSETRON 4 MG PO TBDP: 4.0000 mg | ORAL_TABLET | Freq: Three times a day (TID) | ORAL | Status: DC | PRN

## 2015-10-18 NOTE — Progress Notes (Signed)
Pre visit review using our clinic review tool, if applicable. No additional management support is needed unless otherwise documented below in the visit note. 

## 2015-10-18 NOTE — Patient Instructions (Addendum)
Will get stat labs today and hepatitis panel.  Stat US of abdomen.  Hold lipid medication  temporarily until we get work up done.  Hold zantac presently.  I want you to call your psychiatry PA or MD and notify them of how to taper or to switch(from her tegretol since may increase liver enzyme) Please get copy of lft so you can show lab to your psychiatrist.  Zofran low dose.  No otc meds concerned presently.  Make appointment on Friday and schedule 30 minutes.

## 2015-10-18 NOTE — Progress Notes (Signed)
Subjective:    Patient ID: Megan Duncan, female    DOB: Aug 27, 1946, 69 y.o.   MRN: [id number]  HPI  Pt in for follow up. She states feels very nauseated. Decent bm yesterday. Pt seen on Friday late int he day  and I got results of her lab work on Sunday which showed elevated liver enzymes. I wanted her in today or yesterday. Pt had no vomiting. Pt states after GI cocktail and zantac did not give much relief.   This am some concentrated appearance to her urine one time. Then second urination today looked clear.  Pt drinking alcohol very rare occasional(previous couple of drinks a day) But stopped every day etoh use 10 years . No excess use of tylenol. Pt denies ruq pain. I did advise her today not to drink or take tylenol in light of effect on liver.  In am bloated sensation not present. But after eating nausea and bloated again. Faint epigastric discomfort. Feels like needs to belch.     Review of Systems  Constitutional: Negative for fever, chills and fatigue.  Respiratory: Negative for cough and chest tightness.   Cardiovascular: Negative for chest pain and palpitations.  Gastrointestinal: Positive for nausea and abdominal pain. Negative for vomiting, diarrhea, constipation, blood in stool and rectal pain.       On exam.  Genitourinary: Negative for dysuria, vaginal bleeding and pelvic pain.       See hpi regarding concentrated appearance.  Musculoskeletal: Negative for back pain.  Neurological: Negative for dizziness and headaches.  Hematological: Negative for adenopathy. Does not bruise/bleed easily.  Psychiatric/Behavioral: Negative for behavioral problems and confusion.    Past Medical History  Diagnosis Date  . Anxiety   . Depression   . LBP (low back pain)   . Bipolar II disorder ([place])     Dr. Charlott Holler  . Arthritis     hands  . Hypertension   . Diverticulosis   . Hyperlipidemia     Social History   Social History  . Marital Status: Married    Spouse  Name: N/A  . Number of Children: N/A  . Years of Education: N/A   Occupational History  . Not on file.   Social History Main Topics  . Smoking status: Former Smoker    Quit date: 07/02/2004  . Smokeless tobacco: Never Used  . Alcohol Use: Yes     Comment: occasional wine  . Drug Use: No  . Sexual Activity: Not on file   Other Topics Concern  . Not on file   Social History Narrative   Lives with husband and grandson she is raising.    Past Surgical History  Procedure Laterality Date  . Appendectomy    . Total abdominal hysterectomy    . Tonsillectomy    . Knee arthroscopy  2008    right    Family History  Problem Relation Age of Onset  . Diabetes Father   . Hypertension Father   . Hypertension Mother   . Mental illness Sister     suicide  . Colon cancer Maternal Aunt 80  . Heart attack Father     Allergies  Allergen Reactions  . Codeine Nausea And Vomiting  . Diclofenac Other (See Comments)    Elevated LFT's  . Doxycycline     Extreme nausea  . Sulfonamide Derivatives Swelling    Current Outpatient Prescriptions on File Prior to Visit  Medication Sig Dispense Refill  . albuterol (VENTOLIN HFA) 108 (90  BASE) MCG/ACT inhaler Inhale 2 puffs into the lungs every 4 (four) hours as needed. For shortness of breath and wheezing 18 g 3  . atorvastatin (LIPITOR) 20 MG tablet Take 1 tablet (20 mg total) by mouth daily. 90 tablet 1  . carbamazepine (TEGRETOL XR) 400 MG 12 hr tablet     . clorazepate (TRANXENE) 3.75 MG tablet Take 1 tablet (3.75 mg total) by mouth 2 (two) times daily as needed for anxiety. 90 tablet 1  . cyclobenzaprine (FLEXERIL) 10 MG tablet Take 1 tablet (10 mg total) by mouth 3 (three) times daily as needed. For muscle spasms 30 tablet 0  . DULoxetine (CYMBALTA) 20 MG capsule TAKE 1 CAPSULE BY MOUTH EVERY DAY 30 capsule 0  . fluocinonide cream (LIDEX) [id number] % Apply 1 application topically 2 (two) times daily. 60 g 0  . hydrocortisone-pramoxine  (ANALPRAM HC) 2.5-1 % rectal cream Place 1 application rectally 3 (three) times daily. 30 g 3  . lisinopril-hydrochlorothiazide (PRINZIDE,ZESTORETIC) 10-12.5 MG per tablet Take 0.5 tablets by mouth daily. 45 tablet 1  . naproxen (NAPROSYN) 500 MG tablet TAKE 1 TABLET BY MOUTH TWICE DAILY WITH MEALS 60 tablet 3  . ranitidine (ZANTAC) 150 MG capsule Take 1 capsule (150 mg total) by mouth 2 (two) times daily. 60 capsule 0  . valACYclovir (VALTREX) 1000 MG tablet Take 1 tablet (1,000 mg total) by mouth 3 (three) times daily. 21 tablet 0   No current facility-administered medications on file prior to visit.    BP 106/72 mmHg  Pulse 90  Temp(Src) 98 F (36.7 C) (Oral)  Ht 5\' 2"  (1.575 m)  Wt 196 lb 9.6 oz (89.177 kg)  BMI 35.95 kg/m2  SpO2 95%       Objective:   Physical Exam  General Appearance- Not in acute distress.  HEENT Eyes- Scleraeral/Conjuntiva-bilat- Not Yellow. Mouth & Throat- Normal.  Chest and Lung Exam Auscultation: Breath sounds:-Normal. CTA.Adventitious sounds:- No Adventitious sounds.  Cardiovascular Auscultation:Rythm - Regular. Heart Sounds -Normal heart sounds.  Abdomen Inspection:-Inspection Normal.  Palpation/Perucssion: Palpation and Percussion of the abdomen reveal- mild-moderate epigastric Tender on exam, No Rebound tenderness, No rigidity(Guarding) and No Palpable abdominal masses.  Liver:-Normal.  Spleen:- Normal.   Back- no cva tenderness.        Assessment & Plan:  Will get stat labs today and hepatitis panel.  Stat US of abdomen.  Hold lipid medication temporarily until we get work up done and advise.  Hold zantac presently.  I want you to call your psychiatry PA or MD and notify them of how to taper or to switch.(from her tegretol since may increase liver enzyme)  Zofran low dose.  No otc meds concerned presently.  Make appointment on Friday and schedule 30 minutes.

## 2015-10-19 ENCOUNTER — Telehealth: Payer: Self-pay | Admitting: Medical

## 2015-10-19 LAB — HEPATITIS B CORE ANTIBODY, TOTAL: HEP B C TOTAL AB: NONREACTIVE

## 2015-10-19 LAB — HEPATITIS C ANTIBODY: HCV Ab: NEGATIVE

## 2015-10-19 LAB — HEPATITIS A ANTIBODY, TOTAL: HEP A TOTAL AB: NONREACTIVE

## 2015-10-19 LAB — HEPATITIS B SURFACE ANTIBODY,QUALITATIVE: Hep B S Ab: NEGATIVE

## 2015-10-19 NOTE — Addendum Note (Signed)
Addended by: Tasia Catchings on: 10/19/2015 02:21 PM   Modules accepted: Orders

## 2015-10-19 NOTE — Telephone Encounter (Signed)
I talked with PA at her psychiatrist office. They stopped her tegretol. I explained stopped liptor and zantac. Reviewed lft results. Will see pt tomorrow. psychiatrist office. Fax [phone]. When we gett tomorrow repeat labs please fax him the results

## 2015-10-20 LAB — HEPATITIS B E ANTIBODY: Hepatitis Be Antibody: NONREACTIVE

## 2015-10-21 ENCOUNTER — Telehealth: Payer: Self-pay | Admitting: Medical

## 2015-10-21 ENCOUNTER — Encounter: Payer: Self-pay | Admitting: Medical

## 2015-10-21 ENCOUNTER — Ambulatory Visit (INDEPENDENT_AMBULATORY_CARE_PROVIDER_SITE_OTHER): Payer: Medicare Other | Admitting: Medical

## 2015-10-21 VITALS — BP 130/80 | HR 52 | Temp 98.7°F | Ht 62.0 in | Wt 197.4 lb

## 2015-10-21 DIAGNOSIS — R1013 Epigastric pain: Secondary | ICD-10-CM | POA: Diagnosis not present

## 2015-10-21 DIAGNOSIS — F3132 Bipolar disorder, current episode depressed, moderate: Secondary | ICD-10-CM | POA: Diagnosis not present

## 2015-10-21 DIAGNOSIS — R748 Abnormal levels of other serum enzymes: Secondary | ICD-10-CM

## 2015-10-21 LAB — COMPREHENSIVE METABOLIC PANEL
ALBUMIN: 4.1 g/dL (ref 3.5–5.2)
ALT: 631 U/L — AB (ref 0–35)
AST: 401 U/L — AB (ref 0–37)
Alkaline Phosphatase: 165 U/L — ABNORMAL HIGH (ref 39–117)
BILIRUBIN TOTAL: 0.7 mg/dL (ref 0.2–1.2)
BUN: 15 mg/dL (ref 6–23)
CALCIUM: 9.5 mg/dL (ref 8.4–10.5)
CO2: 32 meq/L (ref 19–32)
CREATININE: 0.85 mg/dL (ref 0.40–1.20)
Chloride: 102 mEq/L (ref 96–112)
GFR: 70.39 mL/min (ref >60.00)
Glucose, Bld: 158 mg/dL — ABNORMAL HIGH (ref 70–99)
Potassium: 4.2 mEq/L (ref 3.5–5.1)
Sodium: 140 mEq/L (ref 135–145)
TOTAL PROTEIN: 7.3 g/dL (ref 6.0–8.3)

## 2015-10-21 NOTE — Progress Notes (Addendum)
   Subjective:    Patient ID: Megan Duncan, female    DOB: 12-13-1945, 69 y.o.   MRN: [id number]  HPI  Pt in for follow up. Pt did review some prior labs and saw mild elevation. But then 2 weeks later er lft were normal. She was surprised by this stating no one every told her.   Pt nausea and she never got the zofran. I did advise her to take.  Pt has stopped her atorvastatin, zantac and her tegretol. Pt psychiatrist pa stopped the tegretol.   We will repeat labs lft today.   Pt is already feeling sad since stopping tegretol but no homicidal or suicidal ideation. She states if get worse will contact. She get little teary eyed during interview and she states she will call pscyh PA today.  Pt has tried som tranxene recently due to her anxiety since stopping tegretol.   Review of Systems  Constitutional: Negative for fever, chills and fatigue.  Respiratory: Negative for cough, chest tightness, shortness of breath and wheezing.   Cardiovascular: Negative for chest pain and palpitations.  Gastrointestinal: Positive for nausea. Negative for vomiting, abdominal pain, diarrhea, constipation and blood in stool.  Musculoskeletal: Negative for back pain.  Skin: Negative for rash.  Neurological: Negative for facial asymmetry, speech difficulty, weakness, light-headedness and numbness.  Hematological: Negative for adenopathy. Does not bruise/bleed easily.  Psychiatric/Behavioral: Negative for suicidal ideas, behavioral problems and confusion. The patient is not nervous/anxious.        Objective:   Physical Exam  General Appearance- Not in acute distress.  HEENT Eyes- Scleraeral/Conjuntiva-bilat- Not Yellow. Mouth & Throat- Normal.  Chest and Lung Exam Auscultation: Breath sounds:-Normal. Adventitious sounds:- No Adventitious sounds.  Cardiovascular Auscultation:Rythm - Regular. Heart Sounds -Normal heart sounds.  Abdomen Inspection:-Inspection Normal.  Palpation/Perucssion:  Palpation and Percussion of the abdomen reveal- faint epigastric  Tender, No Rebound tenderness, No rigidity(Guarding) and No Palpable abdominal masses.  Liver:-Normal.  Spleen:- Normal.   Back- no cva tenderness.      Assessment & Plan:  I am going to repeat your cmp/lft stat today.  Continue to stay off zantac, lipitor and tegretol.  Please call your psychiatrist office and let them know how you area feeling. To see what medications they want you to be on.Let him know you tried your traxene  Any severe and drastic mood change over weekend then ED evaluation.  Follow up me later next week if needed after you see GI.

## 2015-10-21 NOTE — Progress Notes (Signed)
Pre visit review using our clinic review tool, if applicable. No additional management support is needed unless otherwise documented below in the visit note. 

## 2015-10-21 NOTE — Telephone Encounter (Signed)
Pt liver enzymes have improved some but still high. Would you call pt and advise her on results. Also would you get number of pt psychiatrist office and fax results of recent cmp/lft to them. Attention to PA working there. I thought I had placed number in note/in epic but could not find.   I called pt on Friday but got no answer.

## 2015-10-21 NOTE — Patient Instructions (Addendum)
I am going to repeat your cmp/lft stat today.  Continue to stay off zantac, lipitor and tegretol.  Please call your psychiatrist office and let them know how you area feeling. To see what medications they want you to be on. Let him know you tried your traxene  Any severe and drastic mood change over weekend then ED evaluation.  Follow up me later next week if needed after you see GI.

## 2015-10-24 NOTE — Telephone Encounter (Signed)
Called [facility] to review labs. Left message for callback.

## 2015-10-24 NOTE — Telephone Encounter (Signed)
Patient has been notified of results.  

## 2015-10-24 NOTE — Telephone Encounter (Signed)
Faxed results.

## 2015-10-25 ENCOUNTER — Encounter: Payer: Self-pay | Admitting: Gastroenterology

## 2015-10-25 ENCOUNTER — Ambulatory Visit (INDEPENDENT_AMBULATORY_CARE_PROVIDER_SITE_OTHER): Payer: Medicare Other | Admitting: Gastroenterology

## 2015-10-25 ENCOUNTER — Other Ambulatory Visit (INDEPENDENT_AMBULATORY_CARE_PROVIDER_SITE_OTHER): Payer: Medicare Other

## 2015-10-25 VITALS — BP 124/70 | HR 84 | Ht 62.0 in | Wt 196.8 lb

## 2015-10-25 DIAGNOSIS — Z5181 Encounter for therapeutic drug level monitoring: Secondary | ICD-10-CM

## 2015-10-25 DIAGNOSIS — R748 Abnormal levels of other serum enzymes: Secondary | ICD-10-CM | POA: Diagnosis not present

## 2015-10-25 LAB — IBC PANEL
Iron: 105 ug/dL (ref 42–145)
Saturation Ratios: 24.1 % (ref 20.0–50.0)
Transferrin: 311 mg/dL (ref 212.0–360.0)

## 2015-10-25 LAB — COMPREHENSIVE METABOLIC PANEL
ALT: 317 U/L — AB (ref 0–35)
AST: 129 U/L — AB (ref 0–37)
Albumin: 4.2 g/dL (ref 3.5–5.2)
Alkaline Phosphatase: 141 U/L — ABNORMAL HIGH (ref 39–117)
BILIRUBIN TOTAL: 0.5 mg/dL (ref 0.2–1.2)
BUN: 13 mg/dL (ref 6–23)
CO2: 31 meq/L (ref 19–32)
CREATININE: 0.83 mg/dL (ref 0.40–1.20)
Calcium: 9.6 mg/dL (ref 8.4–10.5)
Chloride: 101 mEq/L (ref 96–112)
GFR: 72.35 mL/min (ref >60.00)
GLUCOSE: 139 mg/dL — AB (ref 70–99)
Potassium: 3.6 mEq/L (ref 3.5–5.1)
SODIUM: 142 meq/L (ref 135–145)
Total Protein: 7.4 g/dL (ref 6.0–8.3)

## 2015-10-25 LAB — FERRITIN: FERRITIN: 375 ng/mL — AB (ref 10.0–291.0)

## 2015-10-25 LAB — PROTIME-INR
INR: 1 ratio (ref 0.8–1.0)
Prothrombin Time: 10.4 s (ref 9.6–13.1)

## 2015-10-25 NOTE — Patient Instructions (Addendum)
You will have labs checked today in the basement lab.  Please head down after you check out with the front desk  (cmet, inr, ANA, ASMA, AMA, iron, ferritin, tibc). Stay off of tegretol and atorvostatin for now. Take zofran as needed. Please return to see Dr. Ardis Hughs in 2 months, sooner if needed.

## 2015-10-25 NOTE — Progress Notes (Signed)
HPI: This is a   very pleasant 69 year old woman    who was referred to me by Midge Minium, MD  to evaluate  elevated liver tests, nausea .    Chief complaint is elevated liver tests, nausea  The day before thanksgiving she had severe nausea.  Has continued.  No significant pains.  She feels full, pressure in mid epigastrium.  Bloodwork 11 2016: Hepatitis A antibody negative, hepatitis C antibody negative, hepatitis B surface antibody negative, hepatitis B surface antigen negative, hepatitis B core antibody negative, hepatitis B e antigen negative.  Transaminases with AST and ALT [id number], alkaline phosphatase around 200. Total bilirubin normal. CBC completely normal.  09/2015 [name] fatty liver, otherwise normal.  She thinks her lfts are due to medicines.  She has been on atorvostatin for at least 2-3 years.   She started tegretol for about 6 months ago (07/2015 lfts were normal); this helped with her mood issues.  She started cymbalta for about a year.  She stopped tegretol about a week ago.    Review of systems: Pertinent positive and negative review of systems were noted in the above HPI section. Complete review of systems was performed and was otherwise normal.   Past Medical History  Diagnosis Date  . Anxiety   . Depression   . LBP (low back pain)   . Bipolar II disorder ([place])     Dr. Charlott Holler  . Arthritis     hands  . Hypertension   . Diverticulosis   . Hyperlipidemia   . Fibromyalgia     Past Surgical History  Procedure Laterality Date  . Appendectomy    . Total abdominal hysterectomy    . Tonsillectomy    . Knee arthroscopy Right 2008    Current Outpatient Prescriptions  Medication Sig Dispense Refill  . albuterol (VENTOLIN HFA) 108 (90 BASE) MCG/ACT inhaler Inhale 2 puffs into the lungs every 4 (four) hours as needed. For shortness of breath and wheezing 18 g 3  . clorazepate (TRANXENE) 3.75 MG tablet Take 1 tablet (3.75 mg total) by mouth 2 (two)  times daily as needed for anxiety. 90 tablet 1  . cyclobenzaprine (FLEXERIL) 10 MG tablet Take 1 tablet (10 mg total) by mouth 3 (three) times daily as needed. For muscle spasms 30 tablet 0  . DULoxetine (CYMBALTA) 20 MG capsule TAKE 1 CAPSULE BY MOUTH EVERY DAY 30 capsule 0  . fluocinonide cream (LIDEX) AB-123456789 % Apply 1 application topically 2 (two) times daily. 60 g 0  . hydrocortisone-pramoxine (ANALPRAM HC) 2.5-1 % rectal cream Place 1 application rectally 3 (three) times daily. 30 g 3  . lisinopril-hydrochlorothiazide (PRINZIDE,ZESTORETIC) 10-12.5 MG per tablet Take 0.5 tablets by mouth daily. 45 tablet 1  . ondansetron (ZOFRAN ODT) 4 MG disintegrating tablet Take 1 tablet (4 mg total) by mouth every 8 (eight) hours as needed for nausea or vomiting. 9 tablet 0  . ranitidine (ZANTAC) 150 MG capsule Take 1 capsule (150 mg total) by mouth 2 (two) times daily. (Patient taking differently: Take 150 mg by mouth daily. ) 60 capsule 0  . valACYclovir (VALTREX) 1000 MG tablet Take 1 tablet (1,000 mg total) by mouth 3 (three) times daily. 21 tablet 0   No current facility-administered medications for this visit.    Allergies as of 10/25/2015 - Review Complete 10/25/2015  Allergen Reaction Noted  . Codeine Nausea And Vomiting 11/21/2011  . Diclofenac Other (See Comments) 01/14/2015  . Doxycycline  07/07/2012  . Sulfonamide derivatives  Swelling     Family History  Problem Relation Age of Onset  . Diabetes Father   . Hypertension Father   . Hypertension Mother   . Mental illness Sister     suicide  . Colon cancer Maternal Aunt 80  . Heart attack Father   . Breast cancer Maternal Aunt   . Hiatal hernia Mother   . Alcoholism Father     Social History   Social History  . Marital Status: Married    Spouse Name: N/A  . Number of Children: 3  . Years of Education: N/A   Occupational History  . retired    Social History Main Topics  . Smoking status: Former Smoker    Quit date:  07/02/2004  . Smokeless tobacco: Never Used  . Alcohol Use: Yes     Comment: occasional wine  . Drug Use: No  . Sexual Activity: Not on file   Other Topics Concern  . Not on file   Social History Narrative   Lives with husband and grandson she is raising.     Physical Exam: BP 124/70 mmHg  Pulse 84  Ht 5\' 2"  (1.575 m)  Wt 196 lb 12.8 oz (89.268 kg)  BMI 35.99 kg/m2 Constitutional: generally well-appearing Psychiatric: alert and oriented x3 Eyes: extraocular movements intact Mouth: oral pharynx moist, no lesions Neck: supple no lymphadenopathy Cardiovascular: heart regular rate and rhythm Lungs: clear to auscultation bilaterally Abdomen: soft, nontender, nondistended, no obvious ascites, no peritoneal signs, normal bowel sounds Extremities: no lower extremity edema bilaterally Skin: no lesions on visible extremities   Assessment and plan: 69 y.o. 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 She had been on atorvastatin for at least 2-3 years but more recently was started on Tegretol. I suspect it was one of these or the combination of the 2 is causing her problems. She stopped both of them about a week ago and her liver tests are already significantly improved however still abnormal. Viral etiologies have been ruled out. She will get another battery of blood tests today to check for other causes of liver test abnormalities such as autoimmune, etc. She will get a repeat basic liver panel as well. Ultrasound of her liver was normal.  She'll return to see me in 2 months and sooner if needed. I will be following her liver tests throughout that time. We may try to rechallenge her with the atorvastatin but not until her liver tests completely normalize   Owens Loffler, MD [facility] 10/25/2015, 1:38 PM  Cc: Midge Minium, MD

## 2015-10-26 ENCOUNTER — Telehealth: Payer: Self-pay | Admitting: Gastroenterology

## 2015-10-26 ENCOUNTER — Ambulatory Visit: Payer: Medicare Other | Admitting: Medical

## 2015-10-26 LAB — ANTI-NUCLEAR AB-TITER (ANA TITER)

## 2015-10-26 LAB — ANA: ANA: POSITIVE — AB

## 2015-10-26 LAB — MITOCHONDRIAL ANTIBODIES

## 2015-10-26 NOTE — Telephone Encounter (Signed)
Pt has back pain and fibromyalgia and was taking 1000 mg of naproxen until recently.  She stopped the naproxen but now would like to restart it due to the pain.  She says she will take whatever Dr Ardis Hughs recommends.  Please advise

## 2015-10-27 NOTE — Telephone Encounter (Signed)
Pt has been notified and will call with any further concerns 

## 2015-10-27 NOTE — Telephone Encounter (Signed)
OK to restart the naproxen, would take as sparingly as she can.  She should also take once daily OTC omeprazole 20mg  pill for as long as she is requiring daily high dose naproxen.

## 2015-10-28 ENCOUNTER — Ambulatory Visit: Payer: Medicare Other | Admitting: Medical

## 2015-10-28 LAB — ANTI-SMOOTH MUSCLE ANTIBODY, IGG: Smooth Muscle Ab: 14 U

## 2015-11-01 ENCOUNTER — Other Ambulatory Visit: Payer: Self-pay

## 2015-11-01 DIAGNOSIS — R748 Abnormal levels of other serum enzymes: Secondary | ICD-10-CM

## 2015-11-11 ENCOUNTER — Telehealth: Payer: Self-pay | Admitting: *Deleted

## 2015-11-11 ENCOUNTER — Other Ambulatory Visit (INDEPENDENT_AMBULATORY_CARE_PROVIDER_SITE_OTHER): Payer: Medicare Other

## 2015-11-11 DIAGNOSIS — F3181 Bipolar II disorder: Secondary | ICD-10-CM | POA: Diagnosis not present

## 2015-11-11 DIAGNOSIS — R748 Abnormal levels of other serum enzymes: Secondary | ICD-10-CM

## 2015-11-11 LAB — HEPATIC FUNCTION PANEL
ALBUMIN: 4.3 g/dL (ref 3.5–5.2)
ALT: 49 U/L — ABNORMAL HIGH (ref 0–35)
AST: 36 U/L (ref 0–37)
Alkaline Phosphatase: 77 U/L (ref 39–117)
BILIRUBIN DIRECT: 0.1 mg/dL (ref 0.0–0.3)
TOTAL PROTEIN: 7.7 g/dL (ref 6.0–8.3)
Total Bilirubin: 0.7 mg/dL (ref 0.2–1.2)

## 2015-11-11 NOTE — Telephone Encounter (Signed)
Patient came today 11-11-2015 for labs, per Dr. Owens Loffler.  She came to our office asking if we could take her blood pressure.  Took her blood pressure, 128/80, and pulse was 96.

## 2015-11-15 ENCOUNTER — Other Ambulatory Visit: Payer: Self-pay

## 2015-11-15 DIAGNOSIS — R945 Abnormal results of liver function studies: Principal | ICD-10-CM

## 2015-11-15 DIAGNOSIS — R7989 Other specified abnormal findings of blood chemistry: Secondary | ICD-10-CM

## 2015-11-25 ENCOUNTER — Other Ambulatory Visit (INDEPENDENT_AMBULATORY_CARE_PROVIDER_SITE_OTHER): Payer: Medicare Other

## 2015-11-25 DIAGNOSIS — F3181 Bipolar II disorder: Secondary | ICD-10-CM | POA: Diagnosis not present

## 2015-11-25 DIAGNOSIS — R799 Abnormal finding of blood chemistry, unspecified: Secondary | ICD-10-CM | POA: Diagnosis not present

## 2015-11-25 DIAGNOSIS — R7989 Other specified abnormal findings of blood chemistry: Secondary | ICD-10-CM | POA: Diagnosis not present

## 2015-11-25 DIAGNOSIS — R945 Abnormal results of liver function studies: Principal | ICD-10-CM

## 2015-11-25 LAB — HEPATIC FUNCTION PANEL
ALT: 20 U/L (ref 0–35)
AST: 21 U/L (ref 0–37)
Albumin: 4.1 g/dL (ref 3.5–5.2)
Alkaline Phosphatase: 58 U/L (ref 39–117)
BILIRUBIN TOTAL: 0.4 mg/dL (ref 0.2–1.2)
Bilirubin, Direct: 0.1 mg/dL (ref 0.0–0.3)
Total Protein: 7.3 g/dL (ref 6.0–8.3)

## 2015-11-28 LAB — ANTI-NUCLEAR AB-TITER (ANA TITER)

## 2015-11-28 LAB — ANA: Anti Nuclear Antibody(ANA): POSITIVE — AB

## 2015-11-30 ENCOUNTER — Other Ambulatory Visit: Payer: Self-pay

## 2015-11-30 DIAGNOSIS — R7989 Other specified abnormal findings of blood chemistry: Secondary | ICD-10-CM

## 2015-11-30 DIAGNOSIS — R945 Abnormal results of liver function studies: Principal | ICD-10-CM

## 2015-12-23 DIAGNOSIS — F3181 Bipolar II disorder: Secondary | ICD-10-CM | POA: Diagnosis not present

## 2016-01-03 ENCOUNTER — Ambulatory Visit (INDEPENDENT_AMBULATORY_CARE_PROVIDER_SITE_OTHER): Payer: Medicare Other | Admitting: Gastroenterology

## 2016-01-03 ENCOUNTER — Encounter: Payer: Self-pay | Admitting: Gastroenterology

## 2016-01-03 ENCOUNTER — Other Ambulatory Visit (INDEPENDENT_AMBULATORY_CARE_PROVIDER_SITE_OTHER): Payer: Medicare Other

## 2016-01-03 VITALS — BP 120/68 | HR 64 | Ht 62.0 in | Wt 196.2 lb

## 2016-01-03 DIAGNOSIS — E785 Hyperlipidemia, unspecified: Secondary | ICD-10-CM

## 2016-01-03 DIAGNOSIS — R945 Abnormal results of liver function studies: Principal | ICD-10-CM

## 2016-01-03 DIAGNOSIS — R748 Abnormal levels of other serum enzymes: Secondary | ICD-10-CM | POA: Diagnosis not present

## 2016-01-03 DIAGNOSIS — R7989 Other specified abnormal findings of blood chemistry: Secondary | ICD-10-CM

## 2016-01-03 LAB — COMPREHENSIVE METABOLIC PANEL WITH GFR
ALT: 14 U/L (ref 0–35)
AST: 19 U/L (ref 0–37)
Albumin: 4.3 g/dL (ref 3.5–5.2)
Alkaline Phosphatase: 48 U/L (ref 39–117)
BUN: 10 mg/dL (ref 6–23)
CO2: 33 meq/L — ABNORMAL HIGH (ref 19–32)
Calcium: 10 mg/dL (ref 8.4–10.5)
Chloride: 103 meq/L (ref 96–112)
Creatinine, Ser: 0.92 mg/dL (ref 0.40–1.20)
GFR: 64.21 mL/min
Glucose, Bld: 121 mg/dL — ABNORMAL HIGH (ref 70–99)
Potassium: 4.2 meq/L (ref 3.5–5.1)
Sodium: 142 meq/L (ref 135–145)
Total Bilirubin: 0.5 mg/dL (ref 0.2–1.2)
Total Protein: 7.5 g/dL (ref 6.0–8.3)

## 2016-01-03 LAB — HEPATIC FUNCTION PANEL
ALT: 14 U/L (ref 0–35)
AST: 19 U/L (ref 0–37)
Albumin: 4.3 g/dL (ref 3.5–5.2)
Alkaline Phosphatase: 48 U/L (ref 39–117)
Bilirubin, Direct: 0.1 mg/dL (ref 0.0–0.3)
Total Bilirubin: 0.5 mg/dL (ref 0.2–1.2)
Total Protein: 7.5 g/dL (ref 6.0–8.3)

## 2016-01-03 NOTE — Patient Instructions (Addendum)
You will have labs checked today in the basement lab.  Please head down after you check out with the front desk  (cmet, ANA). Pending these results you may be advised to restart the atorvostatin at its previous dose.  Would monitor your liver closely in that case. Tegretol has been added to your 'allergy' list.

## 2016-01-03 NOTE — Progress Notes (Signed)
Review of pertinent gastrointestinal problems: 1. Elevated liver tests Fall 2016; likely drug related toxicity (tegratol): Had been on statin for years, had started tegretol about 2-3 months prior to LFT rise.  Bloodwork 09/2015: Hepatitis A antibody negative, hepatitis C antibody negative, hepatitis B surface antibody negative, hepatitis B surface antigen negative, hepatitis B core antibody negative, hepatitis B e antigen negative. Transaminases with AST and ALT [id number], alkaline phosphatase around 200. Total bilirubin normal. CBC completely normal. 09/2015 [name] fatty liver, otherwise normal. 10/2015 labs: ANA ++ 1:640 titer, AMA neg, ASMA neg, iron studies essentially normal, INR normal.  11/2014 ANA titer decrease to 1:320 while LFTs completley normalized.    HPI: This is a  very pleasant 70 year old woman whom I last saw about 2 or 3 months ago Chief complaint is elevated liver tests Her liver tests have completely normalized over the past 2-3 months. Her ANA which was quite high titer has also improved but is still elevated. This is all consistent with drug reaction. I have added Tegretol to her list of allergies although she understands it is not truly an allergic reaction   Psychiatrist has tried risperdone.  Also started lamictal in past week.  She has started Cymbalta and this dose is being increased     Past Medical History  Diagnosis Date  . Anxiety   . Depression   . LBP (low back pain)   . Bipolar II disorder ([place])     Dr. Charlott Holler  . Arthritis     hands  . Hypertension   . Diverticulosis   . Hyperlipidemia   . Fibromyalgia     Past Surgical History  Procedure Laterality Date  . Appendectomy    . Total abdominal hysterectomy    . Tonsillectomy    . Knee arthroscopy Right 2008    Current Outpatient Prescriptions  Medication Sig Dispense Refill  . albuterol (VENTOLIN HFA) 108 (90 BASE) MCG/ACT inhaler Inhale 2 puffs into the lungs every 4 (four) hours as needed. For  shortness of breath and wheezing 18 g 3  . clorazepate (TRANXENE) 3.75 MG tablet Take 1 tablet (3.75 mg total) by mouth 2 (two) times daily as needed for anxiety. 90 tablet 1  . cyclobenzaprine (FLEXERIL) 10 MG tablet Take 1 tablet (10 mg total) by mouth 3 (three) times daily as needed. For muscle spasms 30 tablet 0  . DULoxetine (CYMBALTA) 20 MG capsule TAKE 1 CAPSULE BY MOUTH EVERY DAY 30 capsule 0  . fluocinonide cream (LIDEX) AB-123456789 % Apply 1 application topically 2 (two) times daily. 60 g 0  . hydrocortisone-pramoxine (ANALPRAM HC) 2.5-1 % rectal cream Place 1 application rectally 3 (three) times daily. 30 g 3  . lamoTRIgine (LAMICTAL) 25 MG tablet TK 1 T PO D FOR 2 WEEKS THEN 2 TS PO D  1  . lisinopril-hydrochlorothiazide (PRINZIDE,ZESTORETIC) 10-12.5 MG per tablet Take 0.5 tablets by mouth daily. 45 tablet 1  . ranitidine (ZANTAC) 150 MG tablet Take 150 mg by mouth daily.     No current facility-administered medications for this visit.    Allergies as of 01/03/2016 - Review Complete 01/03/2016  Allergen Reaction Noted  . Tegretol [carbamazepine] Other (See Comments) 01/03/2016  . Codeine Nausea And Vomiting 11/21/2011  . Diclofenac Other (See Comments) 01/14/2015  . Doxycycline  07/07/2012  . Sulfonamide derivatives Swelling     Family History  Problem Relation Age of Onset  . Diabetes Father   . Hypertension Father   . Hypertension Mother   . Mental  illness Sister     suicide  . Colon cancer Maternal Aunt 80  . Heart attack Father   . Breast cancer Maternal Aunt   . Hiatal hernia Mother   . Alcoholism Father     Social History   Social History  . Marital Status: Married    Spouse Name: N/A  . Number of Children: 3  . Years of Education: N/A   Occupational History  . retired    Social History Main Topics  . Smoking status: Former Smoker    Quit date: 07/02/2004  . Smokeless tobacco: Never Used  . Alcohol Use: Yes     Comment: occasional wine  . Drug Use: No   . Sexual Activity: Not on file   Other Topics Concern  . Not on file   Social History Narrative   Lives with husband and grandson she is raising.     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 Think it is much more likely this was the offending agent rather than her atorvastatin which she had been on for several years. She will get a repeat set of liver testing today as well as an ANA as I'm curious to see if the titer is improving. If her liver tests are still completely normal and I'm going to advise her to restart her atorvastatin and I will follow her liver tests very closely after that.  Owens Loffler, MD [facility] 01/03/2016, 9:25 AM

## 2016-01-04 LAB — ANTI-NUCLEAR AB-TITER (ANA TITER)

## 2016-01-04 LAB — ANA: Anti Nuclear Antibody(ANA): POSITIVE — AB

## 2016-01-05 ENCOUNTER — Other Ambulatory Visit: Payer: Self-pay

## 2016-01-05 DIAGNOSIS — R945 Abnormal results of liver function studies: Principal | ICD-10-CM

## 2016-01-05 DIAGNOSIS — R7989 Other specified abnormal findings of blood chemistry: Secondary | ICD-10-CM

## 2016-01-12 ENCOUNTER — Telehealth: Payer: Self-pay

## 2016-01-12 NOTE — Telephone Encounter (Signed)
-----   Message from Barron Alvine, RN sent at 01/05/2016 11:21 AM EST ----- Pt to get labs

## 2016-01-12 NOTE — Telephone Encounter (Signed)
Pt aware will have done this week

## 2016-01-27 ENCOUNTER — Other Ambulatory Visit (INDEPENDENT_AMBULATORY_CARE_PROVIDER_SITE_OTHER): Payer: Medicare Other

## 2016-01-27 DIAGNOSIS — R945 Abnormal results of liver function studies: Principal | ICD-10-CM

## 2016-01-27 DIAGNOSIS — R7989 Other specified abnormal findings of blood chemistry: Secondary | ICD-10-CM | POA: Diagnosis not present

## 2016-01-27 LAB — HEPATIC FUNCTION PANEL
ALK PHOS: 65 U/L (ref 39–117)
ALT: 59 U/L — AB (ref 0–35)
AST: 69 U/L — AB (ref 0–37)
Albumin: 4.1 g/dL (ref 3.5–5.2)
BILIRUBIN DIRECT: 0.1 mg/dL (ref 0.0–0.3)
BILIRUBIN TOTAL: 0.5 mg/dL (ref 0.2–1.2)
Total Protein: 7.1 g/dL (ref 6.0–8.3)

## 2016-01-31 ENCOUNTER — Other Ambulatory Visit: Payer: Self-pay

## 2016-01-31 DIAGNOSIS — R7989 Other specified abnormal findings of blood chemistry: Secondary | ICD-10-CM

## 2016-01-31 DIAGNOSIS — R945 Abnormal results of liver function studies: Principal | ICD-10-CM

## 2016-02-01 ENCOUNTER — Telehealth: Payer: Self-pay | Admitting: Gastroenterology

## 2016-02-01 NOTE — Telephone Encounter (Signed)
The pt was notified to stay ON the atorvastatin until after she sees Dr Ardis Hughs and has lab work.

## 2016-02-02 ENCOUNTER — Other Ambulatory Visit (INDEPENDENT_AMBULATORY_CARE_PROVIDER_SITE_OTHER): Payer: Medicare Other

## 2016-02-02 DIAGNOSIS — R945 Abnormal results of liver function studies: Principal | ICD-10-CM

## 2016-02-02 DIAGNOSIS — R7989 Other specified abnormal findings of blood chemistry: Secondary | ICD-10-CM

## 2016-02-02 LAB — HEPATIC FUNCTION PANEL
ALT: 107 U/L — AB (ref 0–35)
AST: 100 U/L — ABNORMAL HIGH (ref 0–37)
Albumin: 4.2 g/dL (ref 3.5–5.2)
Alkaline Phosphatase: 70 U/L (ref 39–117)
Bilirubin, Direct: 0.1 mg/dL (ref 0.0–0.3)
TOTAL PROTEIN: 7.6 g/dL (ref 6.0–8.3)
Total Bilirubin: 0.7 mg/dL (ref 0.2–1.2)

## 2016-02-03 ENCOUNTER — Other Ambulatory Visit: Payer: Self-pay

## 2016-02-03 DIAGNOSIS — R945 Abnormal results of liver function studies: Principal | ICD-10-CM

## 2016-02-03 DIAGNOSIS — F3181 Bipolar II disorder: Secondary | ICD-10-CM | POA: Diagnosis not present

## 2016-02-03 DIAGNOSIS — R7989 Other specified abnormal findings of blood chemistry: Secondary | ICD-10-CM

## 2016-02-14 ENCOUNTER — Other Ambulatory Visit (INDEPENDENT_AMBULATORY_CARE_PROVIDER_SITE_OTHER): Payer: Medicare Other

## 2016-02-14 DIAGNOSIS — R7989 Other specified abnormal findings of blood chemistry: Secondary | ICD-10-CM

## 2016-02-14 DIAGNOSIS — R945 Abnormal results of liver function studies: Principal | ICD-10-CM

## 2016-02-14 LAB — HEPATIC FUNCTION PANEL
ALBUMIN: 4.2 g/dL (ref 3.5–5.2)
ALT: 69 U/L — AB (ref 0–35)
AST: 44 U/L — AB (ref 0–37)
Alkaline Phosphatase: 62 U/L (ref 39–117)
BILIRUBIN TOTAL: 0.6 mg/dL (ref 0.2–1.2)
Bilirubin, Direct: 0.2 mg/dL (ref 0.0–0.3)
TOTAL PROTEIN: 7.3 g/dL (ref 6.0–8.3)

## 2016-02-17 ENCOUNTER — Other Ambulatory Visit: Payer: Self-pay

## 2016-02-17 DIAGNOSIS — R945 Abnormal results of liver function studies: Principal | ICD-10-CM

## 2016-02-17 DIAGNOSIS — R7989 Other specified abnormal findings of blood chemistry: Secondary | ICD-10-CM

## 2016-03-09 ENCOUNTER — Other Ambulatory Visit (INDEPENDENT_AMBULATORY_CARE_PROVIDER_SITE_OTHER): Payer: Medicare Other

## 2016-03-09 DIAGNOSIS — R7989 Other specified abnormal findings of blood chemistry: Secondary | ICD-10-CM | POA: Diagnosis not present

## 2016-03-09 DIAGNOSIS — F3181 Bipolar II disorder: Secondary | ICD-10-CM | POA: Diagnosis not present

## 2016-03-09 DIAGNOSIS — R945 Abnormal results of liver function studies: Principal | ICD-10-CM

## 2016-03-09 LAB — HEPATIC FUNCTION PANEL
ALK PHOS: 58 U/L (ref 39–117)
ALT: 19 U/L (ref 0–35)
AST: 20 U/L (ref 0–37)
Albumin: 4.2 g/dL (ref 3.5–5.2)
BILIRUBIN DIRECT: 0.1 mg/dL (ref 0.0–0.3)
BILIRUBIN TOTAL: 0.6 mg/dL (ref 0.2–1.2)
TOTAL PROTEIN: 7.3 g/dL (ref 6.0–8.3)

## 2016-03-12 ENCOUNTER — Other Ambulatory Visit: Payer: Self-pay

## 2016-03-12 DIAGNOSIS — R7989 Other specified abnormal findings of blood chemistry: Secondary | ICD-10-CM

## 2016-03-12 DIAGNOSIS — R945 Abnormal results of liver function studies: Principal | ICD-10-CM

## 2016-03-16 ENCOUNTER — Ambulatory Visit (INDEPENDENT_AMBULATORY_CARE_PROVIDER_SITE_OTHER): Payer: Medicare Other | Admitting: Family Medicine

## 2016-03-16 ENCOUNTER — Encounter: Payer: Self-pay | Admitting: Family Medicine

## 2016-03-16 VITALS — BP 120/66 | HR 78 | Temp 98.8°F | Ht 62.0 in | Wt 200.6 lb

## 2016-03-16 DIAGNOSIS — R748 Abnormal levels of other serum enzymes: Secondary | ICD-10-CM | POA: Diagnosis not present

## 2016-03-16 DIAGNOSIS — F3181 Bipolar II disorder: Secondary | ICD-10-CM

## 2016-03-16 DIAGNOSIS — F319 Bipolar disorder, unspecified: Secondary | ICD-10-CM

## 2016-03-16 DIAGNOSIS — E785 Hyperlipidemia, unspecified: Secondary | ICD-10-CM

## 2016-03-16 MED ORDER — DULOXETINE HCL 20 MG PO CPEP: ORAL_CAPSULE | ORAL | Status: DC

## 2016-03-16 NOTE — Progress Notes (Signed)
Patient ID: ARIKA PADELFORD, female    DOB: 1946-07-17  Age: 70 y.o. MRN: [id number]    Subjective:  Subjective HPI LURINE MCNEALY presents for f/u liver function abnormality.  Last check with GI was normal--- off statin and tegratol.  Pt was started on trileptal 2 days ago and is concerned about her liver.    Pt sees Dr Clovis Pu  Review of Systems  Constitutional: Negative for diaphoresis, appetite change, fatigue and unexpected weight change.  Eyes: Negative for pain, redness and visual disturbance.  Respiratory: Negative for cough, chest tightness, shortness of breath and wheezing.   Cardiovascular: Negative for chest pain, palpitations and leg swelling.  Endocrine: Negative for cold intolerance, heat intolerance, polydipsia, polyphagia and polyuria.  Genitourinary: Negative for dysuria, frequency and difficulty urinating.  Neurological: Negative for dizziness, light-headedness, numbness and headaches.    History Past Medical History  Diagnosis Date  . Anxiety   . Depression   . LBP (low back pain)   . Bipolar II disorder ([place])     Dr. Charlott Holler  . Arthritis     hands  . Hypertension   . Diverticulosis   . Hyperlipidemia   . Fibromyalgia     She has past surgical history that includes Appendectomy; Total abdominal hysterectomy; Tonsillectomy; and Knee arthroscopy (Right, 2008).   Her family history includes Alcoholism in her father; Breast cancer in her maternal aunt; Colon cancer (age of onset: 50) in her maternal aunt; Diabetes in her father; Heart attack in her father; Hiatal hernia in her mother; Hypertension in her father and mother; Mental illness in her sister.She reports that she quit smoking about 11 years ago. She has never used smokeless tobacco. She reports that she drinks alcohol. She reports that she does not use illicit drugs.  Current Outpatient Prescriptions on File Prior to Visit  Medication Sig Dispense Refill  . albuterol (VENTOLIN HFA) 108 (90 BASE)  MCG/ACT inhaler Inhale 2 puffs into the lungs every 4 (four) hours as needed. For shortness of breath and wheezing 18 g 3  . clorazepate (TRANXENE) 3.75 MG tablet Take 1 tablet (3.75 mg total) by mouth 2 (two) times daily as needed for anxiety. 90 tablet 1  . cyclobenzaprine (FLEXERIL) 10 MG tablet Take 1 tablet (10 mg total) by mouth 3 (three) times daily as needed. For muscle spasms 30 tablet 0  . fluocinonide cream (LIDEX) AB-123456789 % Apply 1 application topically 2 (two) times daily. 60 g 0  . hydrocortisone-pramoxine (ANALPRAM HC) 2.5-1 % rectal cream Place 1 application rectally 3 (three) times daily. 30 g 3  . lamoTRIgine (LAMICTAL) 25 MG tablet TK 1 T PO D FOR 2 WEEKS THEN 2 TS PO D  1  . lisinopril-hydrochlorothiazide (PRINZIDE,ZESTORETIC) 10-12.5 MG per tablet Take 0.5 tablets by mouth daily. 45 tablet 1  . ranitidine (ZANTAC) 150 MG tablet Take 150 mg by mouth daily.     No current facility-administered medications on file prior to visit.     Objective:  Objective Physical Exam  Constitutional: She is oriented to person, place, and time. She appears well-developed and well-nourished.  HENT:  Head: Normocephalic and atraumatic.  Eyes: Conjunctivae and EOM are normal.  Neck: Normal range of motion. Neck supple. No JVD present. Carotid bruit is not present. No thyromegaly present.  Cardiovascular: Normal rate, regular rhythm and normal heart sounds.   No murmur heard. Pulmonary/Chest: Effort normal and breath sounds normal. No respiratory distress. She has no wheezes. She has no rales. She exhibits  no tenderness.  Musculoskeletal: She exhibits no edema.  Neurological: She is alert and oriented to person, place, and time.  Psychiatric: She has a normal mood and affect. Her behavior is normal. Judgment and thought content normal.  Nursing note and vitals reviewed.  BP 120/66 mmHg  Pulse 78  Temp(Src) 98.8 F (37.1 C) (Oral)  Ht 5\' 2"  (1.575 m)  Wt 200 lb 9.6 oz (90.992 kg)  BMI  36.68 kg/m2  SpO2 97% Wt Readings from Last 3 Encounters:  03/16/16 200 lb 9.6 oz (90.992 kg)  01/03/16 196 lb 3.2 oz (88.996 kg)  10/25/15 196 lb 12.8 oz (89.268 kg)     Lab Results  Component Value Date   WBC 6.9 10/18/2015   HGB 15.5* 10/18/2015   HCT 47.0* 10/18/2015   PLT 302.0 10/18/2015   GLUCOSE 121* 01/03/2016   CHOL 198 08/01/2015   TRIG 91.0 08/01/2015   HDL 81.50 08/01/2015   LDLDIRECT 120.1 12/03/2013   LDLCALC 98 08/01/2015   ALT 19 03/09/2016   AST 20 03/09/2016   NA 142 01/03/2016   K 4.2 01/03/2016   CL 103 01/03/2016   CREATININE 0.92 01/03/2016   BUN 10 01/03/2016   CO2 33* 01/03/2016   TSH 2.39 08/01/2015   INR 1.0 10/25/2015   HGBA1C 5.8 12/03/2013    US Abdomen Complete  10/18/2015  CLINICAL DATA:  Elevated LFTs, nausea, bloating EXAM: ULTRASOUND ABDOMEN COMPLETE COMPARISON:  06/25/2007 FINDINGS: Gallbladder: No gallstones or wall thickening visualized. No sonographic Murphy sign noted. Common bile duct: Diameter: 3 mm, limited visualization because of bowel gas. Liver: Mild increased echogenicity compatible with hepatic steatosis or fatty infiltration. Patent portal vein with normal hepatopetal flow. No intrahepatic biliary dilatation or focal hepatic abnormality. IVC: No abnormality visualized. Pancreas: Visualized portion unremarkable. Spleen: Size and appearance within normal limits. Right Kidney: Length: 9.7 cm. Upper pole hypoechoic cyst measures 3.2 cm normal echogenicity and cortical thickness. No hydronephrosis or acute process. Left Kidney: Length: 9.3 cm. Echogenicity within normal limits. No mass or hydronephrosis visualized. Abdominal aorta: No aneurysm visualized. Other findings: None. IMPRESSION: Mild hepatic steatosis.  No other acute finding by ultrasound. 3.2 cm right kidney upper pole cyst. Electronically Signed   By: Jerilynn Mages.  Shick M.D.   On: 10/18/2015 14:02     Assessment & Plan:  Plan I have changed Ms. Philipson's DULoxetine. I am also  having her maintain her albuterol, cyclobenzaprine, fluocinonide cream, lisinopril-hydrochlorothiazide, clorazepate, hydrocortisone-pramoxine, ranitidine, lamoTRIgine, and OXcarbazepine.  Meds ordered this encounter  Medications  . OXcarbazepine (TRILEPTAL) 150 MG tablet    Sig: Take 1 tablet by mouth at bedtime.   . DULoxetine (CYMBALTA) 20 MG capsule    Sig: TAKE 2 CAPSULE BY MOUTH EVERY DAY    Dispense:  60 capsule    Refill:  0    Problem List Items Addressed This Visit      Unprioritized   Bipolar 2 disorder, major depressive episode ([place])   Relevant Medications   DULoxetine (CYMBALTA) 20 MG capsule   Hyperlipidemia    Other Visit Diagnoses    Elevated liver enzymes    -  Primary    Relevant Orders    Comprehensive metabolic panel    Lipid panel    Bipolar 1 disorder, depressed ([place])        Relevant Medications    DULoxetine (CYMBALTA) 20 MG capsule       Follow-up: Return in about 3 months (around 06/16/2016), or if symptoms worsen or fail to improve.  Ann Held, DO

## 2016-03-16 NOTE — Progress Notes (Signed)
Pre visit review using our clinic review tool, if applicable. No additional management support is needed unless otherwise documented below in the visit note. 

## 2016-03-16 NOTE — Patient Instructions (Signed)
Bipolar Disorder Bipolar disorder is a mental illness. The term bipolar disorder actually is used to describe a group of disorders that all share varying degrees of emotional highs and lows that can interfere with daily functioning, such as work, school, or relationships. Bipolar disorder also can lead to drug abuse, hospitalization, and suicide. The emotional highs of bipolar disorder are periods of elation or irritability and high energy. These highs can range from a mild form (hypomania) to a severe form (mania). People experiencing episodes of hypomania may appear energetic, excitable, and highly productive. People experiencing mania may behave impulsively or erratically. They often make poor decisions. They may have difficulty sleeping. The most severe episodes of mania can involve having very distorted beliefs or perceptions about the world and seeing or hearing things that are not real (psychotic delusions and hallucinations).  The emotional lows of bipolar disorder (depression) also can range from mild to severe. Severe episodes of bipolar depression can involve psychotic delusions and hallucinations. Sometimes people with bipolar disorder experience a state of mixed mood. Symptoms of hypomania or mania and depression are both present during this mixed-mood episode. SIGNS AND SYMPTOMS There are signs and symptoms of the episodes of hypomania and mania as well as the episodes of depression. The signs and symptoms of hypomania and mania are similar but vary in severity. They include:  Inflated self-esteem or feeling of increased self-confidence.  Decreased need for sleep.  Unusual talkativeness (rapid or pressured speech) or the feeling of a need to keep talking.  Sensation of racing thoughts or constant talking, with quick shifts between topics that may or may not be related (flight of ideas).  Decreased ability to focus or concentrate.  Increased purposeful activity, such as work, studies,  or social activity, or nonproductive activity, such as pacing, squirming and fidgeting, or finger and toe tapping.  Impulsive behavior and use of poor judgment, resulting in high-risk activities, such as having unprotected sex or spending excessive amounts of money. Signs and symptoms of depression include the following:   Feelings of sadness, hopelessness, or helplessness.  Frequent or uncontrollable episodes of crying.  Lack of feeling anything or caring about anything.  Difficulty sleeping or sleeping too much.  Inability to enjoy the things you used to enjoy.   Desire to be alone all the time.   Feelings of guilt or worthlessness.  Lack of energy or motivation.   Difficulty concentrating, remembering, or making decisions.  Change in appetite or weight beyond normal fluctuations.  Thoughts of death or the desire to harm yourself. DIAGNOSIS  Bipolar disorder is diagnosed through an assessment by your caregiver. Your caregiver will ask questions about your emotional episodes. There are two main types of bipolar disorder. People with type I bipolar disorder have manic episodes with or without depressive episodes. People with type II bipolar disorder have hypomanic episodes and major depressive episodes, which are more serious than mild depression. The type of bipolar disorder you have can make an important difference in how your illness is monitored and treated. Your caregiver may ask questions about your medical history and use of alcohol or drugs, including prescription medication. Certain medical conditions and substances also can cause emotional highs and lows that resemble bipolar disorder (secondary bipolar disorder).  TREATMENT  Bipolar disorder is a long-term illness. It is best controlled with continuous treatment rather than treatment only when symptoms occur. The following treatments can be prescribed for bipolar disorders:  Medication--Medication can be prescribed by  a doctor that  is an expert in treating mental disorders (psychiatrists). Medications called mood stabilizers are usually prescribed to help control the illness. Other medications are sometimes added if symptoms of mania, depression, or psychotic delusions and hallucinations occur despite the use of a mood stabilizer. °· Talk therapy--Some forms of talk therapy are helpful in providing support, education, and guidance. °A combination of medication and talk therapy is best for managing the disorder over time. A procedure in which electricity is applied to your brain through your scalp (electroconvulsive therapy) is used in cases of severe mania when medication and talk therapy do not work or work too slowly. °  °This information is not intended to replace advice given to you by your health care provider. Make sure you discuss any questions you have with your health care provider. °  °Document Released: 02/04/2001 Document Revised: 11/19/2014 Document Reviewed: 11/24/2012 °Elsevier Interactive Patient Education ©2016 [employer] ° °

## 2016-03-30 ENCOUNTER — Other Ambulatory Visit (INDEPENDENT_AMBULATORY_CARE_PROVIDER_SITE_OTHER): Payer: Medicare Other

## 2016-03-30 DIAGNOSIS — R748 Abnormal levels of other serum enzymes: Secondary | ICD-10-CM

## 2016-03-30 LAB — LIPID PANEL
CHOL/HDL RATIO: 3
Cholesterol: 229 mg/dL — ABNORMAL HIGH (ref 0–200)
HDL: 67.2 mg/dL (ref >39.00)
LDL CALC: 147 mg/dL — AB (ref 0–99)
NonHDL: 161.97
TRIGLYCERIDES: 77 mg/dL (ref 0.0–149.0)
VLDL: 15.4 mg/dL (ref 0.0–40.0)

## 2016-03-30 LAB — COMPREHENSIVE METABOLIC PANEL
ALT: 17 U/L (ref 0–35)
AST: 20 U/L (ref 0–37)
Albumin: 4.4 g/dL (ref 3.5–5.2)
Alkaline Phosphatase: 59 U/L (ref 39–117)
BUN: 12 mg/dL (ref 6–23)
CALCIUM: 9.6 mg/dL (ref 8.4–10.5)
CHLORIDE: 104 meq/L (ref 96–112)
CO2: 26 meq/L (ref 19–32)
CREATININE: 0.73 mg/dL (ref 0.40–1.20)
GFR: 83.79 mL/min (ref >60.00)
Glucose, Bld: 111 mg/dL — ABNORMAL HIGH (ref 70–99)
POTASSIUM: 4 meq/L (ref 3.5–5.1)
SODIUM: 139 meq/L (ref 135–145)
Total Bilirubin: 0.4 mg/dL (ref 0.2–1.2)
Total Protein: 7.3 g/dL (ref 6.0–8.3)

## 2016-04-05 NOTE — Addendum Note (Signed)
Addended byDamita Dunnings D on: 04/05/2016 08:04 AM   Modules accepted: Orders

## 2016-04-10 ENCOUNTER — Encounter: Payer: Self-pay | Admitting: Family Medicine

## 2016-04-17 ENCOUNTER — Other Ambulatory Visit: Payer: Self-pay | Admitting: Family Medicine

## 2016-04-20 DIAGNOSIS — F3181 Bipolar II disorder: Secondary | ICD-10-CM | POA: Diagnosis not present

## 2016-05-18 DIAGNOSIS — F3181 Bipolar II disorder: Secondary | ICD-10-CM | POA: Diagnosis not present

## 2016-05-22 ENCOUNTER — Other Ambulatory Visit: Payer: Medicare Other

## 2016-07-06 ENCOUNTER — Other Ambulatory Visit (INDEPENDENT_AMBULATORY_CARE_PROVIDER_SITE_OTHER): Payer: Medicare Other

## 2016-07-06 DIAGNOSIS — R748 Abnormal levels of other serum enzymes: Secondary | ICD-10-CM

## 2016-07-06 LAB — LIPID PANEL
CHOL/HDL RATIO: 3
Cholesterol: 285 mg/dL — ABNORMAL HIGH (ref 0–200)
HDL: 87.4 mg/dL (ref >39.00)
LDL Cholesterol: 178 mg/dL — ABNORMAL HIGH (ref 0–99)
NONHDL: 197.99
TRIGLYCERIDES: 98 mg/dL (ref 0.0–149.0)
VLDL: 19.6 mg/dL (ref 0.0–40.0)

## 2016-07-06 LAB — COMPREHENSIVE METABOLIC PANEL
ALK PHOS: 57 U/L (ref 39–117)
ALT: 15 U/L (ref 0–35)
AST: 20 U/L (ref 0–37)
Albumin: 4.2 g/dL (ref 3.5–5.2)
BILIRUBIN TOTAL: 0.3 mg/dL (ref 0.2–1.2)
BUN: 13 mg/dL (ref 6–23)
CALCIUM: 9.1 mg/dL (ref 8.4–10.5)
CO2: 28 meq/L (ref 19–32)
CREATININE: 0.83 mg/dL (ref 0.40–1.20)
Chloride: 104 mEq/L (ref 96–112)
GFR: 72.2 mL/min (ref >60.00)
Glucose, Bld: 146 mg/dL — ABNORMAL HIGH (ref 70–99)
Potassium: 4.4 mEq/L (ref 3.5–5.1)
Sodium: 139 mEq/L (ref 135–145)
TOTAL PROTEIN: 7.1 g/dL (ref 6.0–8.3)

## 2016-07-13 ENCOUNTER — Telehealth: Payer: Self-pay | Admitting: Family Medicine

## 2016-07-13 DIAGNOSIS — R739 Hyperglycemia, unspecified: Secondary | ICD-10-CM

## 2016-07-13 NOTE — Telephone Encounter (Signed)
Patient has been made aware, I scheduled her a follow up  For next week to discuss with Dr.Lowne.    KP

## 2016-07-13 NOTE — Telephone Encounter (Signed)
°  Relationship to patient: Self  Can be reached: [phone]   Reason for call: Patient request a call back to discuss the results of her test

## 2016-07-13 NOTE — Telephone Encounter (Signed)
Spoke with patient and she stated she will come back to get the labs, apt has been scheduled. She said she has been having some hot flashes and wanted to know what she should do about them? Please advise in Dr.Lowne's absence.   KP

## 2016-07-13 NOTE — Telephone Encounter (Signed)
+   elevated sugar--- Recheck fasting with hgba1c  Dx hyperglycemia--- in the next week  Cholesterol--- LDL goal < 100, HDL >40, TG < 150. Diet and exercise will increase HDL and decrease LDL and TG. Fish, Fish Oil, Flaxseed oil will also help increase the HDL and decrease Triglycerides.  Recheck labs in 3 months. Lipid, cmp, hgba1c   Patient is aware and she verbalized understanding, she will come in on 07/18/16 at 9:30 to check her BMP and A1c.    KP

## 2016-07-13 NOTE — Telephone Encounter (Signed)
No  suggestions except discuss with PCP at the next visit

## 2016-07-18 ENCOUNTER — Other Ambulatory Visit: Payer: Medicare Other

## 2016-07-20 ENCOUNTER — Ambulatory Visit (HOSPITAL_BASED_OUTPATIENT_CLINIC_OR_DEPARTMENT_OTHER)
Admission: RE | Admit: 2016-07-20 | Discharge: 2016-07-20 | Disposition: A | Discharge status: Home / Self Care | Payer: Medicare Other | Source: Ambulatory Visit | Attending: Family Medicine | Admitting: Family Medicine

## 2016-07-20 ENCOUNTER — Encounter: Payer: Self-pay | Admitting: Family Medicine

## 2016-07-20 ENCOUNTER — Ambulatory Visit (INDEPENDENT_AMBULATORY_CARE_PROVIDER_SITE_OTHER): Payer: Medicare Other | Admitting: Family Medicine

## 2016-07-20 VITALS — BP 140/76 | HR 72 | Temp 99.4°F | Ht 62.0 in | Wt 202.6 lb

## 2016-07-20 DIAGNOSIS — R002 Palpitations: Secondary | ICD-10-CM | POA: Diagnosis not present

## 2016-07-20 DIAGNOSIS — R0602 Shortness of breath: Secondary | ICD-10-CM | POA: Insufficient documentation

## 2016-07-20 DIAGNOSIS — I700 Atherosclerosis of aorta: Secondary | ICD-10-CM | POA: Insufficient documentation

## 2016-07-20 DIAGNOSIS — R739 Hyperglycemia, unspecified: Secondary | ICD-10-CM

## 2016-07-20 DIAGNOSIS — J449 Chronic obstructive pulmonary disease, unspecified: Secondary | ICD-10-CM | POA: Diagnosis not present

## 2016-07-20 DIAGNOSIS — R05 Cough: Secondary | ICD-10-CM

## 2016-07-20 DIAGNOSIS — R059 Cough, unspecified: Secondary | ICD-10-CM

## 2016-07-20 DIAGNOSIS — R0609 Other forms of dyspnea: Secondary | ICD-10-CM

## 2016-07-20 HISTORY — DX: Palpitations: R00.2

## 2016-07-20 HISTORY — DX: Other forms of dyspnea: R06.09

## 2016-07-20 LAB — BASIC METABOLIC PANEL
BUN: 15 mg/dL (ref 6–23)
CHLORIDE: 100 meq/L (ref 96–112)
CO2: 29 meq/L (ref 19–32)
CREATININE: 0.77 mg/dL (ref 0.40–1.20)
Calcium: 9.2 mg/dL (ref 8.4–10.5)
GFR: 78.72 mL/min (ref >60.00)
GLUCOSE: 109 mg/dL — AB (ref 70–99)
POTASSIUM: 3.8 meq/L (ref 3.5–5.1)
Sodium: 136 mEq/L (ref 135–145)

## 2016-07-20 LAB — HEMOGLOBIN A1C: HEMOGLOBIN A1C: 6 % (ref 4.6–6.5)

## 2016-07-20 MED ORDER — LIRAGLUTIDE -WEIGHT MANAGEMENT 18 MG/3ML ~~LOC~~ SOPN
3.0000 mg | PEN_INJECTOR | Freq: Every day | SUBCUTANEOUS | 3 refills | Status: DC
Start: 2016-07-20 — End: 2016-10-03

## 2016-07-20 NOTE — Progress Notes (Signed)
Patient ID: Megan Duncan, female    DOB: 1946-10-04  Age: 70 y.o. MRN: [id number]    Subjective:  Subjective  HPI Megan Duncan presents for sob and occasional fluttering in chest especially with exertion.  Pt has also been having  A lot of sweating no chest pain --- + nausea -- no vomiting + dry heaving.  No burping or indigestion.  This has been going on for several months + dry cough--- for years-- has been told in the past it was allergies.   Pt also frustrated with her trouble losing weight and knows a lot of her symptoms maybe because of her weight.  Since her back issues started she has had trouble exercising.    Review of Systems  Constitutional: Positive for fatigue. Negative for activity change, appetite change and unexpected weight change.  HENT: Negative for congestion, postnasal drip, rhinorrhea, sinus pressure, sneezing and sore throat.   Respiratory: Positive for cough and shortness of breath. Negative for wheezing.   Cardiovascular: Positive for palpitations. Negative for chest pain and leg swelling.  Gastrointestinal: Positive for nausea. Negative for abdominal distention, abdominal pain, blood in stool, constipation, diarrhea and vomiting.  Neurological: Negative for dizziness, weakness and light-headedness.  Psychiatric/Behavioral: Negative for behavioral problems and dysphoric mood. The patient is not nervous/anxious.     History Past Medical History:  Diagnosis Date  . Anxiety   . Arthritis    hands  . Bipolar II disorder ([place])    Dr. Charlott Holler  . Depression   . Diverticulosis   . Fibromyalgia   . Hyperlipidemia   . Hypertension   . LBP (low back pain)     She has a past surgical history that includes Appendectomy; Total abdominal hysterectomy; Tonsillectomy; and Knee arthroscopy (Right, 2008).   Her family history includes Alcoholism in her father; Breast cancer in her maternal aunt; Colon cancer (age of onset: 83) in her maternal aunt; Diabetes in  her father; Heart attack in her father; Hiatal hernia in her mother; Hypertension in her father and mother; Mental illness in her sister.She reports that she quit smoking about 12 years ago. She has never used smokeless tobacco. She reports that she drinks alcohol. She reports that she does not use drugs.  Current Outpatient Prescriptions on File Prior to Visit  Medication Sig Dispense Refill  . albuterol (VENTOLIN HFA) 108 (90 BASE) MCG/ACT inhaler Inhale 2 puffs into the lungs every 4 (four) hours as needed. For shortness of breath and wheezing 18 g 3  . clorazepate (TRANXENE) 3.75 MG tablet Take 1 tablet (3.75 mg total) by mouth 2 (two) times daily as needed for anxiety. 90 tablet 1  . cyclobenzaprine (FLEXERIL) 10 MG tablet Take 1 tablet (10 mg total) by mouth 3 (three) times daily as needed. For muscle spasms 30 tablet 0  . DULoxetine (CYMBALTA) 20 MG capsule TAKE 2 CAPSULE BY MOUTH EVERY DAY 60 capsule 0  . fluocinonide cream (LIDEX) AB-123456789 % Apply 1 application topically 2 (two) times daily. 60 g 0  . hydrocortisone-pramoxine (ANALPRAM HC) 2.5-1 % rectal cream Place 1 application rectally 3 (three) times daily. 30 g 3  . lisinopril-hydrochlorothiazide (PRINZIDE,ZESTORETIC) 10-12.5 MG tablet TAKE 1/2 TABLET EVERY DAY 45 tablet 1  . ranitidine (ZANTAC) 150 MG tablet Take 150 mg by mouth daily.     No current facility-administered medications on file prior to visit.      Objective:  Objective  Physical Exam  Constitutional: She is oriented to person, place, and  time. She appears well-developed and well-nourished.  HENT:  Head: Normocephalic and atraumatic.  Eyes: Conjunctivae and EOM are normal.  Neck: Normal range of motion. Neck supple. No JVD present. Carotid bruit is not present. No thyromegaly present.  Cardiovascular: Normal rate, regular rhythm and normal heart sounds.   No murmur heard. Pulmonary/Chest: Effort normal and breath sounds normal. No respiratory distress. She has no  wheezes. She has no rales. She exhibits no tenderness.  Abdominal: Soft. She exhibits no distension. There is no tenderness. There is no rebound and no guarding.  Musculoskeletal: She exhibits no edema or tenderness.  Lymphadenopathy:    She has no cervical adenopathy.  Neurological: She is alert and oriented to person, place, and time.  Skin: No rash noted.  Psychiatric: She has a normal mood and affect. Her behavior is normal. Judgment and thought content normal.  Nursing note and vitals reviewed.  BP 140/76 (BP Location: Left Arm, Patient Position: Sitting, Cuff Size: Normal)   Pulse 72   Temp 99.4 F (37.4 C) (Oral)   Ht 5\' 2"  (1.575 m)   Wt 202 lb 9.6 oz (91.9 kg)   SpO2 98%   BMI 37.06 kg/m  Wt Readings from Last 3 Encounters:  07/20/16 202 lb 9.6 oz (91.9 kg)  03/16/16 200 lb 9.6 oz (91 kg)  01/03/16 196 lb 3.2 oz (89 kg)     Lab Results  Component Value Date   WBC 6.9 10/18/2015   HGB 15.5 (H) 10/18/2015   HCT 47.0 (H) 10/18/2015   PLT 302.0 10/18/2015   GLUCOSE 146 (H) 07/06/2016   CHOL 285 (H) 07/06/2016   TRIG 98.0 07/06/2016   HDL 87.40 07/06/2016   LDLDIRECT 120.1 12/03/2013   LDLCALC 178 (H) 07/06/2016   ALT 15 07/06/2016   AST 20 07/06/2016   NA 139 07/06/2016   K 4.4 07/06/2016   CL 104 07/06/2016   CREATININE 0.83 07/06/2016   BUN 13 07/06/2016   CO2 28 07/06/2016   TSH 2.39 08/01/2015   INR 1.0 10/25/2015   HGBA1C 5.8 12/03/2013    US Abdomen Complete  Result Date: 10/18/2015 CLINICAL DATA:  Elevated LFTs, nausea, bloating EXAM: ULTRASOUND ABDOMEN COMPLETE COMPARISON:  06/25/2007 FINDINGS: Gallbladder: No gallstones or wall thickening visualized. No sonographic Murphy sign noted. Common bile duct: Diameter: 3 mm, limited visualization because of bowel gas. Liver: Mild increased echogenicity compatible with hepatic steatosis or fatty infiltration. Patent portal vein with normal hepatopetal flow. No intrahepatic biliary dilatation or focal hepatic  abnormality. IVC: No abnormality visualized. Pancreas: Visualized portion unremarkable. Spleen: Size and appearance within normal limits. Right Kidney: Length: 9.7 cm. Upper pole hypoechoic cyst measures 3.2 cm normal echogenicity and cortical thickness. No hydronephrosis or acute process. Left Kidney: Length: 9.3 cm. Echogenicity within normal limits. No mass or hydronephrosis visualized. Abdominal aorta: No aneurysm visualized. Other findings: None. IMPRESSION: Mild hepatic steatosis.  No other acute finding by ultrasound. 3.2 cm right kidney upper pole cyst. Electronically Signed   By: Jerilynn Mages.  Shick M.D.   On: 10/18/2015 14:02     ekg-- NSR  Assessment & Plan:  Plan  I have discontinued Megan Duncan's lamoTRIgine and OXcarbazepine. I am also having her start on Liraglutide -Weight Management. Additionally, I am having her maintain her albuterol, cyclobenzaprine, fluocinonide cream, clorazepate, hydrocortisone-pramoxine, ranitidine, DULoxetine, lisinopril-hydrochlorothiazide, and carbamazepine.  Meds ordered this encounter  Medications  . carbamazepine (TEGRETOL XR) 400 MG 12 hr tablet    Sig: Take 400 mg by mouth at bedtime.  Marland Kitchen  Liraglutide -Weight Management (SAXENDA) 18 MG/3ML SOPN    Sig: Inject 3 mg into the skin daily.    Dispense:  3 mL    Refill:  3    Problem List Items Addressed This Visit      Unprioritized   DOE (dyspnea on exertion)    cxr today Echo       Palpitations    Echo Event monitor Call or rto if symptoms worsen or go to er       Other Visit Diagnoses    Fluttering sensation of heart    -  Primary   Relevant Orders   EKG 12-Lead (Completed)   ECHOCARDIOGRAM COMPLETE   Cardiac event monitor   SOB (shortness of breath)       Relevant Orders   DG Chest 2 View   ECHOCARDIOGRAM COMPLETE   Cardiac event monitor   Cough       Relevant Orders   DG Chest 2 View   Morbid obesity due to excess calories (HCC)       Relevant Medications   Liraglutide -Weight  Management (SAXENDA) 18 MG/3ML SOPN   Hyperglycemia       Relevant Orders   Basic metabolic panel   Hemoglobin A1c      Follow-up: Return in about 4 weeks (around 08/17/2016) for f/u sob, palp or sooner prn.  Ann Held, DO

## 2016-07-20 NOTE — Patient Instructions (Addendum)
saxenda---  0.6 mg daily for 1 week then 1.2 mg daily for 1 week then 1.8 mg daily x 1 week then 2.4 mg daily then 3.0 mg daily     Shortness of Breath Shortness of breath means you have trouble breathing. It could also mean that you have a medical problem. You should get immediate medical care for shortness of breath. CAUSES   Not enough oxygen in the air such as with high altitudes or a smoke-filled room.  Certain lung diseases, infections, or problems.  Heart disease or conditions, such as angina or heart failure.  Low red blood cells (anemia).  Poor physical fitness, which can cause shortness of breath when you exercise.  Chest or back injuries or stiffness.  Being overweight.  Smoking.  Anxiety, which can make you feel like you are not getting enough air. DIAGNOSIS  Serious medical problems can often be found during your physical exam. Tests may also be done to determine why you are having shortness of breath. Tests may include:  Chest X-rays.  Lung function tests.  Blood tests.  An electrocardiogram (ECG).  An ambulatory electrocardiogram. An ambulatory ECG records your heartbeat patterns over a 24-hour period.  Exercise testing.  A transthoracic echocardiogram (TTE). During echocardiography, sound waves are used to evaluate how blood flows through your heart.  A transesophageal echocardiogram (TEE).  Imaging scans. Your health care provider may not be able to find a cause for your shortness of breath after your exam. In this case, it is important to have a follow-up exam with your health care provider as directed.  TREATMENT  Treatment for shortness of breath depends on the cause of your symptoms and can vary greatly. HOME CARE INSTRUCTIONS   Do not smoke. Smoking is a common cause of shortness of breath. If you smoke, ask for help to quit.  Avoid being around chemicals or things that may bother your breathing, such as paint fumes and dust.  Rest as  needed. Slowly resume your usual activities.  If medicines were prescribed, take them as directed for the full length of time directed. This includes oxygen and any inhaled medicines.  Keep all follow-up appointments as directed by your health care provider. SEEK MEDICAL CARE IF:   Your condition does not improve in the time expected.  You have a hard time doing your normal activities even with rest.  You have any new symptoms. SEEK IMMEDIATE MEDICAL CARE IF:   Your shortness of breath gets worse.  You feel light-headed, faint, or develop a cough not controlled with medicines.  You start coughing up blood.  You have pain with breathing.  You have chest pain or pain in your arms, shoulders, or abdomen.  You have a fever.  You are unable to walk up stairs or exercise the way you normally do. MAKE SURE YOU:  Understand these instructions.  Will watch your condition.  Will get help right away if you are not doing well or get worse.   This information is not intended to replace advice given to you by your health care provider. Make sure you discuss any questions you have with your health care provider.   Document Released: 07/24/2001 Document Revised: 11/03/2013 Document Reviewed: 01/14/2012 Elsevier Interactive Patient Education [employer].

## 2016-07-20 NOTE — Assessment & Plan Note (Signed)
Echo Event monitor Call or rto if symptoms worsen or go to er

## 2016-07-20 NOTE — Progress Notes (Signed)
Pre visit review using our clinic review tool, if applicable. No additional management support is needed unless otherwise documented below in the visit note. 

## 2016-07-20 NOTE — Assessment & Plan Note (Signed)
cxr today Echo

## 2016-07-23 ENCOUNTER — Other Ambulatory Visit: Payer: Self-pay

## 2016-07-23 MED ORDER — FLUTICASONE-SALMETEROL 250-50 MCG/DOSE IN AEPB: 1.0000 | INHALATION_SPRAY | Freq: Two times a day (BID) | RESPIRATORY_TRACT | 5 refills | Status: DC

## 2016-08-01 ENCOUNTER — Encounter: Payer: Self-pay | Admitting: Family Medicine

## 2016-08-02 ENCOUNTER — Other Ambulatory Visit: Payer: Self-pay

## 2016-08-02 ENCOUNTER — Ambulatory Visit (HOSPITAL_COMMUNITY): Payer: Medicare Other | Attending: Internal Medicine

## 2016-08-02 ENCOUNTER — Ambulatory Visit (INDEPENDENT_AMBULATORY_CARE_PROVIDER_SITE_OTHER): Payer: Medicare Other

## 2016-08-02 DIAGNOSIS — R0602 Shortness of breath: Secondary | ICD-10-CM

## 2016-08-02 DIAGNOSIS — R002 Palpitations: Secondary | ICD-10-CM

## 2016-08-02 DIAGNOSIS — I501 Left ventricular failure: Secondary | ICD-10-CM | POA: Insufficient documentation

## 2016-08-08 ENCOUNTER — Telehealth: Payer: Self-pay

## 2016-08-08 DIAGNOSIS — R0602 Shortness of breath: Secondary | ICD-10-CM

## 2016-08-08 NOTE — Telephone Encounter (Signed)
Spoke with patient and she stated that the Advair Is not making a difference and she is still experiencing the SOB, she is having the back pain as well and having a difficult time doing normal every day stuff . She wants to know what to do about the SOB. Please advise    KP

## 2016-08-08 NOTE — Telephone Encounter (Signed)
She is also having a dry cough daily as well having episodes of sweating and wanted to know if this could be a side effect to one of her medications.    KP

## 2016-08-09 ENCOUNTER — Telehealth: Payer: Self-pay | Admitting: Gastroenterology

## 2016-08-09 MED ORDER — BUDESONIDE-FORMOTEROL FUMARATE 80-4.5 MCG/ACT IN AERO: 2.0000 | INHALATION_SPRAY | Freq: Two times a day (BID) | RESPIRATORY_TRACT | 3 refills | Status: DC

## 2016-08-09 MED ORDER — AMLODIPINE BESYLATE 5 MG PO TABS: 5.0000 mg | ORAL_TABLET | Freq: Every day | ORAL | 1 refills | Status: DC

## 2016-08-09 NOTE — Telephone Encounter (Signed)
Pt was advised that Dr Ardis Hughs did add tegretol to  Her allergy list, although not a true allergy.  She states she will stop the tegretol and see if the gagging gets better.  If not she will call back.

## 2016-08-09 NOTE — Telephone Encounter (Signed)
Change lisinopril to norvasc 5 mg daily #30  2 refills Refer to pulmonary  Change advair to symbicort 80  2 puffs bid  Ov if symptoms do not improve and pulm appointment is too far out

## 2016-08-09 NOTE — Telephone Encounter (Signed)
Discussed with patient and she verbalized understanding and has agreed to switch both medications. Norvasc sent to [facility] and Symbicort faxed to [facility] per patient request.    KP

## 2016-08-20 ENCOUNTER — Encounter: Payer: Self-pay | Admitting: Family Medicine

## 2016-08-20 NOTE — Telephone Encounter (Signed)
error:[id number] ° °

## 2016-08-21 ENCOUNTER — Telehealth: Payer: Self-pay | Admitting: Family Medicine

## 2016-08-21 ENCOUNTER — Telehealth: Payer: Self-pay | Admitting: *Deleted

## 2016-08-21 NOTE — Telephone Encounter (Signed)
Follow Up:    Pt is still waiting waiting to hear something,says she have been waiting for 2 days to hear something.

## 2016-08-21 NOTE — Telephone Encounter (Signed)
Follow Up:    Pt says she have been wearing the monitor since 9-21-1.Pt continue to have a rash,she have worn the sensitive pads and follow all the other instruction. Pt still breaks out,no matter what she does.She wants to know if she stop wearing it before her 30 days,what are the consequences? Can she stop wearing it?

## 2016-08-21 NOTE — Telephone Encounter (Signed)
New Message  Pt voiced she may be having an allergic reaction to the temporary monitor she has and cannot stop itching.  Please f/u with pt

## 2016-08-22 NOTE — Telephone Encounter (Signed)
Spoke with patient regarding reaction to electrodes  for cardiac event monitor.  Patient has tried 2 alternative types of electrodes from [employer] without success.  Patient wishes to remove monitor at this time.  I informed patient [employer] will be notified she is temporarily removing her monitor.  If she becomes symptomatic, she may reapply her monitor temporarily to record her symptoms.  Her end of service date will be 08/31/2016.

## 2016-08-24 DIAGNOSIS — F3181 Bipolar II disorder: Secondary | ICD-10-CM | POA: Diagnosis not present

## 2016-09-06 ENCOUNTER — Institutional Professional Consult (permissible substitution): Payer: Medicare Other | Admitting: Emergency Medicine

## 2016-09-12 DIAGNOSIS — Z23 Encounter for immunization: Secondary | ICD-10-CM | POA: Diagnosis not present

## 2016-09-21 DIAGNOSIS — F3181 Bipolar II disorder: Secondary | ICD-10-CM | POA: Diagnosis not present

## 2016-10-03 ENCOUNTER — Ambulatory Visit (INDEPENDENT_AMBULATORY_CARE_PROVIDER_SITE_OTHER): Payer: Medicare Other | Admitting: Emergency Medicine

## 2016-10-03 ENCOUNTER — Encounter: Payer: Self-pay | Admitting: Emergency Medicine

## 2016-10-03 VITALS — BP 138/82 | HR 76 | Ht 62.0 in | Wt 199.0 lb

## 2016-10-03 DIAGNOSIS — J301 Allergic rhinitis due to pollen: Secondary | ICD-10-CM | POA: Diagnosis not present

## 2016-10-03 DIAGNOSIS — J309 Allergic rhinitis, unspecified: Secondary | ICD-10-CM

## 2016-10-03 DIAGNOSIS — R0609 Other forms of dyspnea: Secondary | ICD-10-CM

## 2016-10-03 DIAGNOSIS — R0602 Shortness of breath: Secondary | ICD-10-CM

## 2016-10-03 HISTORY — DX: Allergic rhinitis, unspecified: J30.9

## 2016-10-03 NOTE — Assessment & Plan Note (Signed)
Add loratdine

## 2016-10-03 NOTE — Patient Instructions (Addendum)
Walking oximetry today  We will perform full pulmonary function testing  We will do a trial of Spiriva Respimat, 2 puffs once a day until next visit.  Take albuterol 2 puffs up to every 4 hours if needed for shortness of breath.  Start loratadine 10mg  daily (Claritin)  Follow with Dr Lamonte Sakai in 4 -6 weeks to see how you are doing on the new medication.

## 2016-10-03 NOTE — Assessment & Plan Note (Signed)
Multifactorial, due to obesity, deconditioning, probable COPD, ? A component of UA irritation from PND. Consider also OSA - will work up at some point. For now, eval for hypoxemia. Will perform PFT to determine degree of obstruction, restriction. Start spiriva.   Walking oximetry today  We will perform full pulmonary function testing  We will do a trial of Spiriva Respimat, 2 puffs once a day until next visit.  Take albuterol 2 puffs up to every 4 hours if needed for shortness of breath.  Start loratadine 10mg  daily (Claritin)  Follow with Dr Lamonte Sakai in 4 -6 weeks to see how you are doing on the new medication.

## 2016-10-03 NOTE — Progress Notes (Signed)
Subjective:    Patient ID: Megan Duncan, female    DOB: 1946-07-04, 70 y.o.   MRN: [id number]  HPI 70 yo former smoker (60 pack-yrs) with hx HTN, diastolic dysfxn, obesity. Has hx transaminitis that was ascribed to meds. She has been experiencing dyspnea on exertion. Has been going on for several years, slowly progressive. Now notes that she cannot do stairs without SOB. Happens also with speaking a lot. Occasionally has felt tightness in her chest. She describes low energy, knows that she needs to increase her exercise, lose weight. She was started on Breo, Symbicort for a trial a week, not sure it helped her. She does use albuterol prn, seems to have helped her although she uses rarely. She has some dry cough, less since stopping her ACE-I. She does have chronic nasal gtt to her throat.    Review of Systems  Constitutional: Negative for fever and unexpected weight change.  HENT: Negative for congestion, dental problem, ear pain, nosebleeds, postnasal drip, rhinorrhea, sinus pressure, sneezing, sore throat and trouble swallowing.   Eyes: Negative for redness and itching.  Respiratory: Positive for cough and shortness of breath. Negative for chest tightness and wheezing.   Cardiovascular: Positive for palpitations. Negative for leg swelling.  Gastrointestinal: Negative for nausea and vomiting.  Genitourinary: Negative for dysuria.  Musculoskeletal: Negative for joint swelling.  Skin: Negative for rash.  Neurological: Negative for headaches.  Hematological: Does not bruise/bleed easily.  Psychiatric/Behavioral: Positive for dysphoric mood. The patient is nervous/anxious.     Past Medical History:  Diagnosis Date  . Anxiety   . Arthritis    hands  . Bipolar II disorder ([place])    Dr. Charlott Holler  . Depression   . Diverticulosis   . Fibromyalgia   . Hyperlipidemia   . Hypertension   . LBP (low back pain)      Family History  Problem Relation Age of Onset  . Colon cancer Maternal  Aunt 80  . Diabetes Father   . Hypertension Father   . Heart attack Father   . Alcoholism Father   . Hypertension Mother   . Hiatal hernia Mother   . Mental illness Sister     suicide  . Breast cancer Maternal Aunt      Social History   Social History  . Marital status: Married    Spouse name: N/A  . Number of children: 3  . Years of education: N/A   Occupational History  . retired    Social History Main Topics  . Smoking status: Former Smoker    Packs/day: 1.50    Years: 40.00    Quit date: 07/02/2004  . Smokeless tobacco: Never Used  . Alcohol use Yes     Comment: occasional wine  . Drug use: No  . Sexual activity: Not on file   Other Topics Concern  . Not on file   Social History Narrative   Lives with husband and grandson she is raising.     Allergies  Allergen Reactions  . Atorvastatin Other (See Comments)    Significant rise in liver tests  . Codeine Nausea And Vomiting  . Diclofenac Other (See Comments)    Elevated LFT's  . Doxycycline     Extreme nausea  . Sulfonamide Derivatives Swelling     Outpatient Medications Prior to Visit  Medication Sig Dispense Refill  . albuterol (VENTOLIN HFA) 108 (90 BASE) MCG/ACT inhaler Inhale 2 puffs into the lungs every 4 (four) hours as needed. For  shortness of breath and wheezing 18 g 3  . amLODipine (NORVASC) 5 MG tablet Take 1 tablet (5 mg total) by mouth daily. 90 tablet 1  . clorazepate (TRANXENE) 3.75 MG tablet Take 1 tablet (3.75 mg total) by mouth 2 (two) times daily as needed for anxiety. 90 tablet 1  . cyclobenzaprine (FLEXERIL) 10 MG tablet Take 1 tablet (10 mg total) by mouth 3 (three) times daily as needed. For muscle spasms 30 tablet 0  . DULoxetine (CYMBALTA) 20 MG capsule TAKE 2 CAPSULE BY MOUTH EVERY DAY 60 capsule 0  . fluocinonide cream (LIDEX) [id number] % Apply 1 application topically 2 (two) times daily. 60 g 0  . hydrocortisone-pramoxine (ANALPRAM HC) 2.5-1 % rectal cream Place 1 application  rectally 3 (three) times daily. 30 g 3  . ranitidine (ZANTAC) 150 MG tablet Take 150 mg by mouth daily.    . carbamazepine (TEGRETOL XR) 400 MG 12 hr tablet Take 400 mg by mouth at bedtime.    . Liraglutide -Weight Management (SAXENDA) 18 MG/3ML SOPN Inject 3 mg into the skin daily. 3 mL 3  . budesonide-formoterol (SYMBICORT) 80-4.5 MCG/ACT inhaler Inhale 2 puffs into the lungs 2 (two) times daily. (Patient not taking: Reported on 10/03/2016) 1 Inhaler 3   No facility-administered medications prior to visit.         Objective:   Physical Exam Vitals:   10/03/16 1152 10/03/16 1154  BP:  138/82  Pulse:  76  SpO2:  96%  Weight: 199 lb (90.3 kg)   Height: 5\' 2"  (1.575 m)    Gen: Pleasant, overwt woman, in no distress,  normal affect  ENT: No lesions,  mouth clear,  oropharynx clear, no postnasal drip  Neck: No JVD, no TMG, no carotid bruits  Lungs: No use of accessory muscles, clear without rales or rhonchi  Cardiovascular: RRR, heart sounds normal, no murmur or gallops, no peripheral edema  Musculoskeletal: No deformities, no cyanosis or clubbing  Neuro: alert, non focal  Skin: Warm, no lesions or rashes   TTE 08/02/16 -  Study Conclusions  - Left ventricle: The cavity size was normal. Wall thickness was   normal. Systolic function was normal. The estimated ejection   fraction was in the range of 55% to 60%. Doppler parameters are   consistent with abnormal left ventricular relaxation (grade 1   diastolic dysfunction). - normal RV size and fxn - normal RA      Assessment & Plan:  DOE (dyspnea on exertion) Multifactorial, due to obesity, deconditioning, probable COPD, ? A component of UA irritation from PND. Consider also OSA - will work up at some point. For now, eval for hypoxemia. Will perform PFT to determine degree of obstruction, restriction. Start spiriva.   Walking oximetry today  We will perform full pulmonary function testing  We will do a trial of  Spiriva Respimat, 2 puffs once a day until next visit.  Take albuterol 2 puffs up to every 4 hours if needed for shortness of breath.  Start loratadine 10mg  daily (Claritin)  Follow with Dr Lamonte Sakai in 4 -6 weeks to see how you are doing on the new medication.   Allergic rhinitis Add loratdine    Baltazar Apo, MD, PhD 10/03/2016, 12:27 PM [facility] [phone] or if no answer [phone]

## 2016-10-11 ENCOUNTER — Ambulatory Visit (HOSPITAL_COMMUNITY)
Admission: RE | Admit: 2016-10-11 | Discharge: 2016-10-11 | Disposition: A | Discharge status: Home / Self Care | Payer: Medicare Other | Source: Ambulatory Visit | Attending: Emergency Medicine | Admitting: Emergency Medicine

## 2016-10-11 DIAGNOSIS — R0602 Shortness of breath: Secondary | ICD-10-CM | POA: Insufficient documentation

## 2016-10-11 LAB — PULMONARY FUNCTION TEST
DL/VA % PRED: 95 %
DL/VA: 4.2 ml/min/mmHg/L
DLCO unc % pred: 70 %
DLCO unc: 14.16 ml/min/mmHg
FEF 25-75 POST: 1.18 L/s
FEF 25-75 Pre: 0.63 L/sec
FEF2575-%Change-Post: 88 %
FEF2575-%PRED-POST: 68 %
FEF2575-%Pred-Pre: 36 %
FEV1-%CHANGE-POST: 16 %
FEV1-%PRED-PRE: 67 %
FEV1-%Pred-Post: 77 %
FEV1-PRE: 1.33 L
FEV1-Post: 1.54 L
FEV1FVC-%Change-Post: 15 %
FEV1FVC-%PRED-PRE: 84 %
FEV6-%Change-Post: 2 %
FEV6-%Pred-Post: 83 %
FEV6-%Pred-Pre: 81 %
FEV6-Post: 2.08 L
FEV6-Pre: 2.03 L
FEV6FVC-%Change-Post: 1 %
FEV6FVC-%Pred-Post: 104 %
FEV6FVC-%Pred-Pre: 103 %
FVC-%Change-Post: 0 %
FVC-%PRED-POST: 79 %
FVC-%PRED-PRE: 79 %
FVC-POST: 2.08 L
FVC-PRE: 2.07 L
POST FEV1/FVC RATIO: 74 %
PRE FEV6/FVC RATIO: 98 %
Post FEV6/FVC ratio: 100 %
Pre FEV1/FVC ratio: 64 %
RV % pred: 87 %
RV: 1.8 L
TLC % pred: 85 %
TLC: 3.94 L

## 2016-10-11 MED ORDER — ALBUTEROL SULFATE (2.5 MG/3ML) 0.083% IN NEBU
2.5000 mg | INHALATION_SOLUTION | Freq: Once | RESPIRATORY_TRACT | Status: AC
  Administered 2016-10-11: 2.5 mg via RESPIRATORY_TRACT

## 2016-10-17 DIAGNOSIS — F3181 Bipolar II disorder: Secondary | ICD-10-CM | POA: Diagnosis not present

## 2016-10-24 ENCOUNTER — Telehealth: Payer: Self-pay | Admitting: *Deleted

## 2016-10-24 NOTE — Telephone Encounter (Signed)
sched 11/21/16 @10am 

## 2016-10-30 ENCOUNTER — Ambulatory Visit (INDEPENDENT_AMBULATORY_CARE_PROVIDER_SITE_OTHER): Payer: Medicare Other | Admitting: Emergency Medicine

## 2016-10-30 ENCOUNTER — Encounter: Payer: Self-pay | Admitting: Emergency Medicine

## 2016-10-30 DIAGNOSIS — J449 Chronic obstructive pulmonary disease, unspecified: Secondary | ICD-10-CM

## 2016-10-30 DIAGNOSIS — J301 Allergic rhinitis due to pollen: Secondary | ICD-10-CM | POA: Diagnosis not present

## 2016-10-30 MED ORDER — TIOTROPIUM BROMIDE-OLODATEROL 2.5-2.5 MCG/ACT IN AERS: 2.0000 | INHALATION_SPRAY | Freq: Every day | RESPIRATORY_TRACT | 6 refills | Status: DC

## 2016-10-30 MED ORDER — IPRATROPIUM BROMIDE 0.03 % NA SOLN
2.0000 | Freq: Three times a day (TID) | NASAL | 6 refills | Status: DC
Start: 2016-10-30 — End: 2017-02-12

## 2016-10-30 MED ORDER — IPRATROPIUM BROMIDE 0.03 % NA SOLN: 2.0000 | Freq: Three times a day (TID) | NASAL | 6 refills | Status: DC

## 2016-10-30 NOTE — Progress Notes (Signed)
Subjective:    Patient ID: Megan Duncan, female    DOB: [birth year], 70 y.o.   MRN: [id number]  HPI 70 yo former smoker (60 pack-yrs) with hx HTN, diastolic dysfxn, obesity. Has hx transaminitis that was ascribed to meds. She has been experiencing dyspnea on exertion. Has been going on for several years, slowly progressive. Now notes that she cannot do stairs without SOB. Happens also with speaking a lot. Occasionally has felt tightness in her chest. She describes low energy, knows that she needs to increase her exercise, lose weight. She was started on Breo, Symbicort for a trial a week, not sure it helped her. She does use albuterol prn, seems to have helped her although she uses rarely. She has some dry cough, less since stopping her ACE-I. She does have chronic nasal gtt to her throat.   ROV 10/30/16 -- This follow-up visit for multifactorial dyspnea in the setting of obesity, deconditioning and probable COPD. She also has a history of nasal drip and upper airway irritation with associated cough. At her last visit we initiated Spiriva Respimat to see if she would benefit. We also started loratadine 10 mg daily. Pulmonary function testing was performed on 10/11/16 that I personally reviewed. These show moderately severe obstruction with a positive bronchodilator response, normal lung volumes consistent with probable superimposed restriction, and a decreased diffusion capacity that corrects to the normal range when adjusted for alveolar volume. She believes that she can tell an improvement in her breathing since addition of spiriva. Her cough has been better, but still with nasal gtt.    Review of Systems  Constitutional: Negative for fever and unexpected weight change.  HENT: Negative for congestion, dental problem, ear pain, nosebleeds, postnasal drip, rhinorrhea, sinus pressure, sneezing, sore throat and trouble swallowing.   Eyes: Negative for redness and itching.  Respiratory: Positive  for cough and shortness of breath. Negative for chest tightness and wheezing.   Cardiovascular: Positive for palpitations. Negative for leg swelling.  Gastrointestinal: Negative for nausea and vomiting.  Genitourinary: Negative for dysuria.  Musculoskeletal: Negative for joint swelling.  Skin: Negative for rash.  Neurological: Negative for headaches.  Hematological: Does not bruise/bleed easily.  Psychiatric/Behavioral: Positive for dysphoric mood. The patient is nervous/anxious.     Past Medical History:  Diagnosis Date  . Anxiety   . Arthritis    hands  . Bipolar II disorder ([place])    Dr. Charlott Holler  . Depression   . Diverticulosis   . Fibromyalgia   . Hyperlipidemia   . Hypertension   . LBP (low back pain)      Family History  Problem Relation Age of Onset  . Colon cancer Maternal Aunt 80  . Diabetes Father   . Hypertension Father   . Heart attack Father   . Alcoholism Father   . Hypertension Mother   . Hiatal hernia Mother   . Mental illness Sister     suicide  . Breast cancer Maternal Aunt      Social History   Social History  . Marital status: Married    Spouse name: N/A  . Number of children: 3  . Years of education: N/A   Occupational History  . retired    Social History Main Topics  . Smoking status: Former Smoker    Packs/day: 1.50    Years: 40.00    Quit date: 07/02/2004  . Smokeless tobacco: Never Used  . Alcohol use Yes     Comment: occasional wine  .  Drug use: No  . Sexual activity: Not on file   Other Topics Concern  . Not on file   Social History Narrative   Lives with husband and grandson she is raising.     Allergies  Allergen Reactions  . Atorvastatin Other (See Comments)    Significant rise in liver tests  . Codeine Nausea And Vomiting  . Diclofenac Other (See Comments)    Elevated LFT's  . Doxycycline     Extreme nausea  . Sulfonamide Derivatives Swelling     Outpatient Medications Prior to Visit  Medication Sig Dispense  Refill  . albuterol (VENTOLIN HFA) 108 (90 BASE) MCG/ACT inhaler Inhale 2 puffs into the lungs every 4 (four) hours as needed. For shortness of breath and wheezing 18 g 3  . amLODipine (NORVASC) 5 MG tablet Take 1 tablet (5 mg total) by mouth daily. 90 tablet 1  . clorazepate (TRANXENE) 3.75 MG tablet Take 1 tablet (3.75 mg total) by mouth 2 (two) times daily as needed for anxiety. 90 tablet 1  . cyclobenzaprine (FLEXERIL) 10 MG tablet Take 1 tablet (10 mg total) by mouth 3 (three) times daily as needed. For muscle spasms 30 tablet 0  . DULoxetine (CYMBALTA) 20 MG capsule TAKE 2 CAPSULE BY MOUTH EVERY DAY 60 capsule 0  . fluocinonide cream (LIDEX) AB-123456789 % Apply 1 application topically 2 (two) times daily. 60 g 0  . hydrocortisone-pramoxine (ANALPRAM HC) 2.5-1 % rectal cream Place 1 application rectally 3 (three) times daily. 30 g 3  . lithium 300 MG tablet Take 300 mg by mouth 2 (two) times daily.    . ranitidine (ZANTAC) 150 MG tablet Take 150 mg by mouth daily.    . Tiotropium Bromide Monohydrate (SPIRIVA RESPIMAT) 2.5 MCG/ACT AERS Inhale 2 puffs into the lungs daily.    . budesonide-formoterol (SYMBICORT) 80-4.5 MCG/ACT inhaler Inhale 2 puffs into the lungs 2 (two) times daily. 1 Inhaler 3   No facility-administered medications prior to visit.         Objective:   Physical Exam Vitals:   10/30/16 1206  BP: 126/88  Pulse: 78  SpO2: 96%  Weight: 199 lb (90.3 kg)  Height: 5\' 2"  (1.575 m)   Gen: Pleasant, overwt woman, in no distress,  normal affect  ENT: No lesions,  mouth clear,  oropharynx clear, no postnasal drip  Neck: No JVD, no TMG, no carotid bruits  Lungs: No use of accessory muscles, clear without rales or rhonchi  Cardiovascular: RRR, heart sounds normal, no murmur or gallops, no peripheral edema  Musculoskeletal: No deformities, no cyanosis or clubbing  Neuro: alert, non focal  Skin: Warm, no lesions or rashes   TTE 08/02/16 -  Study Conclusions  - Left  ventricle: The cavity size was normal. Wall thickness was   normal. Systolic function was normal. The estimated ejection   fraction was in the range of 55% to 60%. Doppler parameters are   consistent with abnormal left ventricular relaxation (grade 1   diastolic dysfunction). - normal RV size and fxn - normal RA      Assessment & Plan:  COPD (chronic obstructive pulmonary disease) (HCC) Function testing and clinical presentation consistent with COPD. She did benefit some from Spiriva. Based on her positive bronchodilator response on like to try Stiolto to see if she gets more benefit. She will do a month long trial of this and then call me to let me know whether she has an improvement in her breathing, side effects  etc. Based on this we will decide whether to continue Stiolto versus Spiriva long-term.  Allergic rhinitis Continue loratadine Start Atrovent nasal spray 2-3 times daily   Baltazar Apo, MD, PhD 10/30/2016, 12:32 PM [facility] [phone] or if no answer [phone]

## 2016-10-30 NOTE — Patient Instructions (Signed)
Please continue your loratadine 10mg  daily We will try using atrovent nasal spray, 2 sprays each nostril 2-3x a day as needed for nasal drainage We will try starting Stiolto 2 puffs once a day to see if you get additional benefit with your breathing (better than Spiriva).  Call our office in 1 month to report how you are doing on the [place].                                                                              Take albuterol 2 puffs up to every 4 hours if needed for shortness of breath.  Follow with Dr Lamonte Sakai in 3 months or sooner if you have any problems.

## 2016-10-30 NOTE — Assessment & Plan Note (Signed)
Function testing and clinical presentation consistent with COPD. She did benefit some from Spiriva. Based on her positive bronchodilator response on like to try Stiolto to see if she gets more benefit. She will do a month long trial of this and then call me to let me know whether she has an improvement in her breathing, side effects etc. Based on this we will decide whether to continue Stiolto versus Spiriva long-term.

## 2016-10-30 NOTE — Addendum Note (Signed)
Addended by: Virl Cagey on: 10/30/2016 12:45 PM   Modules accepted: Orders

## 2016-10-30 NOTE — Assessment & Plan Note (Signed)
Continue loratadine Start Atrovent nasal spray 2-3 times daily

## 2016-11-16 DIAGNOSIS — F3181 Bipolar II disorder: Secondary | ICD-10-CM | POA: Diagnosis not present

## 2016-11-21 ENCOUNTER — Ambulatory Visit: Payer: Medicare Other | Admitting: *Deleted

## 2016-11-21 NOTE — Progress Notes (Deleted)
Subjective:   Megan Duncan is a 71 y.o. female who presents for Medicare Annual (Subsequent) preventive examination.  Review of Systems:  No ROS.  Medicare Wellness Visit.    Sleep patterns: {SX; SLEEP PATTERNS:18802::"feels rested on waking","does not get up to void","gets up *** times nightly to void","*** hours nightly"}.   Home Safety/Smoke Alarms:   Living environment; residence and Firearm Safety: {Rehab home environment / accessibility:30080::"no firearms","firearms stored safely"}. Seat Belt Safety/Bike Helmet: Wears seat belt.   Counseling:   Eye Exam-  Dental-  Female:   Pap- N/A; hysterectomy        Mammo- last 08/23/15. BI-RADS CATEGORY  1: Negative.   Dexa scan- last 08/23/15. Normal.         CCS- last 07/16/12 w/ Dr. Verl Blalock. 1 polyp removed, severe diverticulosis. 3 year recall. > Next due September 2018 per letter from GI office dated 08/29/15.    Objective:     Vitals: There were no vitals taken for this visit.  There is no height or weight on file to calculate BMI.   Tobacco History  Smoking Status  . Former Smoker  . Packs/day: 1.50  . Years: 40.00  . Quit date: 07/02/2004  Smokeless Tobacco  . Never Used     Counseling given: Not Answered   Past Medical History:  Diagnosis Date  . Anxiety   . Arthritis    hands  . Bipolar II disorder ([place])    Dr. Charlott Holler  . Depression   . Diverticulosis   . Fibromyalgia   . Hyperlipidemia   . Hypertension   . LBP (low back pain)    Past Surgical History:  Procedure Laterality Date  . APPENDECTOMY    . KNEE ARTHROSCOPY Right 2008  . TONSILLECTOMY    . TOTAL ABDOMINAL HYSTERECTOMY     Family History  Problem Relation Age of Onset  . Colon cancer Maternal Aunt 80  . Diabetes Father   . Hypertension Father   . Heart attack Father   . Alcoholism Father   . Hypertension Mother   . Hiatal hernia Mother   . Mental illness Sister     suicide  . Breast cancer Maternal Aunt    History    Sexual Activity  . Sexual activity: Not on file    Outpatient Encounter Prescriptions as of 11/22/2016  Medication Sig  . albuterol (VENTOLIN HFA) 108 (90 BASE) MCG/ACT inhaler Inhale 2 puffs into the lungs every 4 (four) hours as needed. For shortness of breath and wheezing  . amLODipine (NORVASC) 5 MG tablet Take 1 tablet (5 mg total) by mouth daily.  . clorazepate (TRANXENE) 3.75 MG tablet Take 1 tablet (3.75 mg total) by mouth 2 (two) times daily as needed for anxiety.  . cyclobenzaprine (FLEXERIL) 10 MG tablet Take 1 tablet (10 mg total) by mouth 3 (three) times daily as needed. For muscle spasms  . DULoxetine (CYMBALTA) 20 MG capsule TAKE 2 CAPSULE BY MOUTH EVERY DAY  . fluocinonide cream (LIDEX) AB-123456789 % Apply 1 application topically 2 (two) times daily.  . hydrocortisone-pramoxine (ANALPRAM HC) 2.5-1 % rectal cream Place 1 application rectally 3 (three) times daily.  Marland Kitchen ipratropium (ATROVENT) 0.03 % nasal spray Place 2 sprays into both nostrils 3 (three) times daily.  Marland Kitchen lithium 300 MG tablet Take 300 mg by mouth 2 (two) times daily.  . ranitidine (ZANTAC) 150 MG tablet Take 150 mg by mouth daily.  . Tiotropium Bromide Monohydrate (SPIRIVA RESPIMAT) 2.5 MCG/ACT AERS Inhale  2 puffs into the lungs daily.  . Tiotropium Bromide-Olodaterol (STIOLTO RESPIMAT) 2.5-2.5 MCG/ACT AERS Inhale 2 puffs into the lungs daily.   No facility-administered encounter medications on file as of 11/22/2016.     Activities of Daily Living No flowsheet data found.  Patient Care Team: Ann Held, DO as PCP - General (Family Medicine) Comer Locket, PA-C as Physician Assistant (Physician Assistant) Milus Banister, MD as Attending Physician (Gastroenterology)    Assessment:    Physical assessment deferred to PCP.  Exercise Activities and Dietary recommendations    Diet (meal preparation, eat out, water intake, caffeinated beverages, dairy products, fruits and vegetables): {Desc;  diets:16563} Breakfast: Lunch:  Dinner:      Goals    None     Fall Risk Fall Risk  10/18/2015 08/01/2015 07/15/2014  Falls in the past year? No No No   Depression Screen PHQ 2/9 Scores 10/18/2015 08/01/2015 07/15/2014  PHQ - 2 Score 0 0 6  PHQ- 9 Score - - 16  Exception Documentation Patient refusal - -     Cognitive Function        Immunization History  Administered Date(s) Administered  . Influenza Whole 08/17/2009, 08/09/2010, 09/24/2012, 08/11/2013  . Influenza, High Dose Seasonal PF 09/12/2016  . Influenza,inj,Quad PF,36+ Mos 07/15/2014, 08/01/2015  . Pneumococcal Conjugate-13 07/15/2014  . Pneumococcal Polysaccharide-23 08/01/2015  . Zoster 09/24/2012   Screening Tests Health Maintenance  Topic Date Due  . TETANUS/TDAP  05/05/1965  . COLONOSCOPY  07/17/2015  . MAMMOGRAM  08/22/2016  . INFLUENZA VACCINE  Completed  . DEXA SCAN  Completed  . ZOSTAVAX  Completed  . Hepatitis C Screening  Completed  . PNA vac Low Risk Adult  Completed      Plan:   *** During the course of the visit the patient was educated and counseled about the following appropriate screening and preventive services:   Vaccines to include Pneumoccal, Influenza, Hepatitis B, Td, Zostavax, HCV  Electrocardiogram  Cardiovascular Disease  Colorectal cancer screening  Bone density screening  Diabetes screening  Glaucoma screening  Mammography/PAP  Nutrition counseling   Patient Instructions (the written plan) was given to the patient.   Dorrene German, RN  11/21/2016

## 2016-11-22 ENCOUNTER — Ambulatory Visit: Payer: Medicare Other | Admitting: *Deleted

## 2017-01-08 ENCOUNTER — Other Ambulatory Visit: Payer: Self-pay | Admitting: Family Medicine

## 2017-01-09 NOTE — Progress Notes (Signed)
Subjective:   Megan Duncan is a 71 y.o. female who presents for Medicare Annual (Subsequent) preventive examination.  The Patient was informed that the wellness visit is to identify future health risk and educate and initiate measures that can reduce risk for increased disease through the lifespan.   Describes health as good.  Review of Systems:  No ROS.  Medicare Wellness Visit. Cardiac Risk Factors include: dyslipidemia;sedentary lifestyle;advanced age (>2men, >23 women);hypertension;obesity (BMI >30kg/m2)   Sleep patterns: Sleeps atleast 8 hrs/night. Naps during day. Home Safety/Smoke Alarms:  Feels safe in home. Smoke alarms in place.  Living environment; residence and Firearm Safety: 3 story home, 12 stairs in home, lives with husband, no firearms.. Seat Belt Safety/Bike Helmet: Wears seat belt.   Counseling:   Eye Exam- Yearly Dental- Yearly.   Female:   Pap-  Aged out.     Mammo-  08/23/2015 BI-RADS CATEGORY  1: Negative. Dexa scan-    08/23/2015 normal.     CCS-07/16/2012. Recall 2018.      Objective:    Vitals: BP 132/82 (BP Location: Left Arm, Cuff Size: Large)   Pulse 89   Resp 14   Ht 5\' 2"  (1.575 m)   Wt 196 lb 9.6 oz (89.2 kg)   SpO2 98%   BMI 35.96 kg/m   Body mass index is 35.96 kg/m.   Tobacco History  Smoking Status  . Former Smoker  . Packs/day: 1.50  . Years: 40.00  . Quit date: 07/02/2004  Smokeless Tobacco  . Never Used     Counseling given: Not Answered   Past Medical History:  Diagnosis Date  . Anxiety   . Arthritis    hands  . Bipolar II disorder ([place])    Dr. Charlott Holler  . COPD (chronic obstructive pulmonary disease) ([place])   . Depression   . Diastolic dysfunction    Per pt, diagnosed after [facility]  . Diverticulosis   . Fibromyalgia   . Hyperlipidemia   . Hypertension   . LBP (low back pain)    Past Surgical History:  Procedure Laterality Date  . APPENDECTOMY    . KNEE ARTHROSCOPY Right 2008  . TONSILLECTOMY    .  TOTAL ABDOMINAL HYSTERECTOMY     Family History  Problem Relation Age of Onset  . Colon cancer Maternal Aunt 80  . Diabetes Father   . Hypertension Father   . Heart attack Father   . Alcoholism Father   . Hypertension Mother   . Hiatal hernia Mother   . Mental illness Sister     suicide  . Breast cancer Maternal Aunt    History  Sexual Activity  . Sexual activity: No    Outpatient Encounter Prescriptions as of 01/10/2017  Medication Sig  . albuterol (VENTOLIN HFA) 108 (90 BASE) MCG/ACT inhaler Inhale 2 puffs into the lungs every 4 (four) hours as needed. For shortness of breath and wheezing  . amLODipine (NORVASC) 5 MG tablet TAKE 1 TABLET EVERY DAY  (REPLACES LISINOPRIL/HCTZ)  . clorazepate (TRANXENE) 3.75 MG tablet Take 1 tablet (3.75 mg total) by mouth 2 (two) times daily as needed for anxiety.  . cyclobenzaprine (FLEXERIL) 10 MG tablet Take 1 tablet (10 mg total) by mouth 3 (three) times daily as needed. For muscle spasms  . DULoxetine (CYMBALTA) 20 MG capsule TAKE 2 CAPSULE BY MOUTH EVERY DAY  . fluocinonide cream (LIDEX) AB-123456789 % Apply 1 application topically 2 (two) times daily. (Patient taking differently: Apply 1 application topically as needed. )  .  hydrocortisone-pramoxine (ANALPRAM HC) 2.5-1 % rectal cream Place 1 application rectally 3 (three) times daily. (Patient taking differently: Place 1 application rectally as needed. )  . ipratropium (ATROVENT) 0.03 % nasal spray Place 2 sprays into both nostrils 3 (three) times daily. (Patient taking differently: Place 2 sprays into both nostrils as needed. )  . lithium 300 MG tablet Take 300 mg by mouth 2 (two) times daily.  . ranitidine (ZANTAC) 150 MG tablet Take 150 mg by mouth as needed.   . Tiotropium Bromide Monohydrate (SPIRIVA RESPIMAT) 2.5 MCG/ACT AERS Inhale 2 puffs into the lungs daily.  . Tiotropium Bromide-Olodaterol (STIOLTO RESPIMAT) 2.5-2.5 MCG/ACT AERS Inhale 2 puffs into the lungs daily.   No  facility-administered encounter medications on file as of 01/10/2017.     Activities of Daily Living In your present state of health, do you have any difficulty performing the following activities: 01/10/2017  Hearing? N  Vision? N  Difficulty concentrating or making decisions? N  Walking or climbing stairs? Y  Dressing or bathing? N  Doing errands, shopping? N  Preparing Food and eating ? N  Using the Toilet? N  In the past six months, have you accidently leaked urine? Y  Do you have problems with loss of bowel control? N  Managing your Medications? N  Managing your Finances? N  Housekeeping or managing your Housekeeping? N  Some recent data might be hidden    Patient Care Team: Ann Held, DO as PCP - General (Family Medicine) Comer Locket, PA-C as Physician Assistant (Physician Assistant) Milus Banister, MD as Attending Physician (Gastroenterology)    Assessment:    Physical assessment deferred to PCP.  Exercise Activities and Dietary recommendations Current Exercise Habits: The patient does not participate in regular exercise at present, Exercise limited by: None identified  Diet (meal preparation, eat out, water intake, caffeinated beverages, dairy products, fruits and vegetables):  Breakfast: Yogurt or cheerios with fresh fruit. Or cheese toast. 2 cups coffee.  Lunch: Salad, salmon and capers or sometimes frozen food.  Dinner:    Sometimes skip, sometimes popsicle or apples and cheese.  Drinks cran apple juice, grapefruit la croix, water.   Goals    . Increase walking to 3 days a week for 30 minutes      Fall Risk Fall Risk  01/10/2017 10/18/2015 08/01/2015 07/15/2014  Falls in the past year? No No No No   Depression Screen PHQ 2/9 Scores 01/10/2017 10/18/2015 08/01/2015 07/15/2014  PHQ - 2 Score 6 0 0 6  PHQ- 9 Score 16 - - 16  Exception Documentation - Patient refusal - -     Cognitive Function MMSE - Mini Mental State Exam 01/10/2017  Orientation to time 5    Orientation to Place 5  Registration 3  Attention/ Calculation 4  Recall 3  Language- name 2 objects 2  Language- repeat 1  Language- follow 3 step command 3  Language- read & follow direction 1  Write a sentence 1  Copy design 1  Total score 29        Immunization History  Administered Date(s) Administered  . Influenza Whole 08/17/2009, 08/09/2010, 09/24/2012, 08/11/2013  . Influenza, High Dose Seasonal PF 09/12/2016  . Influenza,inj,Quad PF,36+ Mos 07/15/2014, 08/01/2015  . Pneumococcal Conjugate-13 07/15/2014  . Pneumococcal Polysaccharide-23 08/01/2015  . Zoster 09/24/2012   Screening Tests Health Maintenance  Topic Date Due  . TETANUS/TDAP  05/05/1965  . COLONOSCOPY  07/17/2015  . MAMMOGRAM  08/22/2016  . INFLUENZA VACCINE  Completed  . DEXA SCAN  Completed  . Hepatitis C Screening  Completed  . PNA vac Low Risk Adult  Completed      Plan:    Bring a copy of your advance directives to your next office visit.  During the course of the visit the patient was educated and counseled about the following appropriate screening and preventive services:   Vaccines to include Pneumoccal, Influenza, Hepatitis B, Td, Zostavax, HCV  Cardiovascular Disease  Colorectal cancer screening - Due in September  Bone density screening - Due in October  Diabetes screening  Glaucoma screening  Mammography/PAP - order placed today  Nutrition counseling   Patient Instructions (the written plan) was given to the patient.   Ree Edman, RN  01/10/2017

## 2017-01-09 NOTE — Progress Notes (Signed)
Pre visit review using our clinic review tool, if applicable. No additional management support is needed unless otherwise documented below in the visit note. 

## 2017-01-10 ENCOUNTER — Ambulatory Visit (INDEPENDENT_AMBULATORY_CARE_PROVIDER_SITE_OTHER): Payer: Medicare Other | Admitting: *Deleted

## 2017-01-10 ENCOUNTER — Encounter: Payer: Self-pay | Admitting: *Deleted

## 2017-01-10 VITALS — BP 132/82 | HR 89 | Resp 14 | Ht 62.0 in | Wt 196.6 lb

## 2017-01-10 DIAGNOSIS — Z1231 Encounter for screening mammogram for malignant neoplasm of breast: Secondary | ICD-10-CM | POA: Diagnosis not present

## 2017-01-10 DIAGNOSIS — Z1239 Encounter for other screening for malignant neoplasm of breast: Secondary | ICD-10-CM

## 2017-01-10 DIAGNOSIS — Z0000 Encounter for general adult medical examination without abnormal findings: Secondary | ICD-10-CM

## 2017-01-10 NOTE — Progress Notes (Signed)
reviewed

## 2017-01-10 NOTE — Patient Instructions (Addendum)
Schedule mammogram.  Bring a copy of your advance directives to your next office visit.  Low-Sodium Eating Plan Sodium, which is an element that makes up salt, helps you maintain a healthy balance of fluids in your body. Too much sodium can increase your blood pressure and cause fluid and waste to be held in your body. Your health care provider or dietitian may recommend following this plan if you have high blood pressure (hypertension), kidney disease, liver disease, or heart failure. Eating less sodium can help lower your blood pressure, reduce swelling, and protect your heart, liver, and kidneys. What are tips for following this plan? General guidelines   Most people on this plan should limit their sodium intake to 1,500-2,000 mg (milligrams) of sodium each day. Reading food labels   The Nutrition Facts label lists the amount of sodium in one serving of the food. If you eat more than one serving, you must multiply the listed amount of sodium by the number of servings.  Choose foods with less than 140 mg of sodium per serving.  Avoid foods with 300 mg of sodium or more per serving. Shopping   Look for lower-sodium products, often labeled as "low-sodium" or "no salt added."  Always check the sodium content even if foods are labeled as "unsalted" or "no salt added".  Buy fresh foods.  Avoid canned foods and premade or frozen meals.  Avoid canned, cured, or processed meats  Buy breads that have less than 80 mg of sodium per slice. Cooking   Eat more home-cooked food and less restaurant, buffet, and fast food.  Avoid adding salt when cooking. Use salt-free seasonings or herbs instead of table salt or sea salt. Check with your health care provider or pharmacist before using salt substitutes.  Cook with plant-based oils, such as canola, sunflower, or olive oil. Meal planning   When eating at a restaurant, ask that your food be prepared with less salt or no salt, if  possible.  Avoid foods that contain MSG (monosodium glutamate). MSG is sometimes added to [place] food, bouillon, and some canned foods. What foods are recommended? The items listed may not be a complete list. Talk with your dietitian about what dietary choices are best for you. Grains  Low-sodium cereals, including oats, puffed wheat and rice, and shredded wheat. Low-sodium crackers. Unsalted rice. Unsalted pasta. Low-sodium bread. Whole-grain breads and whole-grain pasta. Vegetables  Fresh or frozen vegetables. "No salt added" canned vegetables. "No salt added" tomato sauce and paste. Low-sodium or reduced-sodium tomato and vegetable juice. Fruits  Fresh, frozen, or canned fruit. Fruit juice. Meats and other protein foods  Fresh or frozen (no salt added) meat, poultry, seafood, and fish. Low-sodium canned tuna and salmon. Unsalted nuts. Dried peas, beans, and lentils without added salt. Unsalted canned beans. Eggs. Unsalted nut butters. Dairy  Milk. Soy milk. Cheese that is naturally low in sodium, such as ricotta cheese, fresh mozzarella, or Swiss cheese Low-sodium or reduced-sodium cheese. Cream cheese. Yogurt. Fats and oils  Unsalted butter. Unsalted margarine with no trans fat. Vegetable oils such as canola or olive oils. Seasonings and other foods  Fresh and dried herbs and spices. Salt-free seasonings. Low-sodium mustard and ketchup. Sodium-free salad dressing. Sodium-free light mayonnaise. Fresh or refrigerated horseradish. Lemon juice. Vinegar. Homemade, reduced-sodium, or low-sodium soups. Unsalted popcorn and pretzels. Low-salt or salt-free chips. What foods are not recommended? The items listed may not be a complete list. Talk with your dietitian about what dietary choices are best for you.  Grains  Instant hot cereals. Bread stuffing, pancake, and biscuit mixes. Croutons. Seasoned rice or pasta mixes. Noodle soup cups. Boxed or frozen macaroni and cheese. Regular salted crackers.  Self-rising flour. Vegetables  Sauerkraut, pickled vegetables, and relishes. Olives. [place] fries. Onion rings. Regular canned vegetables (not low-sodium or reduced-sodium). Regular canned tomato sauce and paste (not low-sodium or reduced-sodium). Regular tomato and vegetable juice (not low-sodium or reduced-sodium). Frozen vegetables in sauces. Meats and other protein foods  Meat or fish that is salted, canned, smoked, spiced, or pickled. Bacon, ham, sausage, hotdogs, corned beef, chipped beef, packaged lunch meats, salt pork, jerky, pickled herring, anchovies, regular canned tuna, sardines, salted nuts. Dairy  Processed cheese and cheese spreads. Cheese curds. Blue cheese. Feta cheese. String cheese. Regular cottage cheese. Buttermilk. Canned milk. Fats and oils  Salted butter. Regular margarine. Ghee. Bacon fat. Seasonings and other foods  Onion salt, garlic salt, seasoned salt, table salt, and sea salt. Canned and packaged gravies. Worcestershire sauce. Tartar sauce. Barbecue sauce. Teriyaki sauce. Soy sauce, including reduced-sodium. Steak sauce. Fish sauce. Oyster sauce. Cocktail sauce. Horseradish that you find on the shelf. Regular ketchup and mustard. Meat flavorings and tenderizers. Bouillon cubes. Hot sauce and Tabasco sauce. Premade or packaged marinades. Premade or packaged taco seasonings. Relishes. Regular salad dressings. Salsa. Potato and tortilla chips. Corn chips and puffs. Salted popcorn and pretzels. Canned or dried soups. Pizza. Frozen entrees and pot pies. Summary  Eating less sodium can help lower your blood pressure, reduce swelling, and protect your heart, liver, and kidneys.  Most people on this plan should limit their sodium intake to 1,500-2,000 mg (milligrams) of sodium each day.  Canned, boxed, and frozen foods are high in sodium. Restaurant foods, fast foods, and pizza are also very high in sodium. You also get sodium by adding salt to food.  Try to cook at home,  eat more fresh fruits and vegetables, and eat less fast food, canned, processed, or prepared foods. This information is not intended to replace advice given to you by your health care provider. Make sure you discuss any questions you have with your health care provider. Document Released: 04/20/2002 Document Revised: 10/22/2016 Document Reviewed: 10/22/2016 Elsevier Interactive Patient Education  2017 [employer].

## 2017-01-11 ENCOUNTER — Telehealth: Payer: Self-pay | Admitting: Family Medicine

## 2017-01-11 DIAGNOSIS — F3181 Bipolar II disorder: Secondary | ICD-10-CM | POA: Diagnosis not present

## 2017-01-11 NOTE — Telephone Encounter (Signed)
Pt called in to speak with [facility]. She said that she had her wellness visit yesterday and discussed her medications and BP. Pt says that she seen a different provider today and discussed her BP also. Pt says that she was placed on a BP medication. She would like to speak with [facility] directly in regards to it to be sure that it works with other meds.   CB: [phone]

## 2017-01-11 NOTE — Telephone Encounter (Signed)
Pt contacted. States she wanted to clarify the inhaler that she is using because she was unsure yesterday at [facility]. Pt states she is using a stiolpo restinat inhaler. Pt also wanted to make Dr.Lowne aware that she visited her psychiatrist today to discuss new tremors that she believes may be a side effect of her Lithium. Pt states that the psychiatrist prescribed her propanolol 10mg  BID for "high blood pressure". Pt states that she is going to check BP twice daily at home and notify Dr. Etter Sjogren of any questions or concerns. Pt states that she has no questions or concerns at this time, but just wanted Dr.Lowne informed.

## 2017-01-11 NOTE — Telephone Encounter (Signed)
Ok -- please add to med list

## 2017-01-22 ENCOUNTER — Ambulatory Visit: Payer: Medicare Other | Admitting: Emergency Medicine

## 2017-01-24 ENCOUNTER — Ambulatory Visit (HOSPITAL_BASED_OUTPATIENT_CLINIC_OR_DEPARTMENT_OTHER): Payer: Medicare Other

## 2017-01-28 ENCOUNTER — Ambulatory Visit (HOSPITAL_BASED_OUTPATIENT_CLINIC_OR_DEPARTMENT_OTHER): Payer: Medicare Other

## 2017-01-31 ENCOUNTER — Ambulatory Visit (HOSPITAL_BASED_OUTPATIENT_CLINIC_OR_DEPARTMENT_OTHER)
Admission: RE | Admit: 2017-01-31 | Discharge: 2017-01-31 | Disposition: A | Discharge status: Home / Self Care | Payer: Medicare Other | Source: Ambulatory Visit | Attending: Family Medicine | Admitting: Family Medicine

## 2017-01-31 DIAGNOSIS — Z1231 Encounter for screening mammogram for malignant neoplasm of breast: Secondary | ICD-10-CM | POA: Insufficient documentation

## 2017-01-31 DIAGNOSIS — Z1239 Encounter for other screening for malignant neoplasm of breast: Secondary | ICD-10-CM

## 2017-02-05 ENCOUNTER — Encounter: Payer: Self-pay | Admitting: Family Medicine

## 2017-02-05 ENCOUNTER — Ambulatory Visit (INDEPENDENT_AMBULATORY_CARE_PROVIDER_SITE_OTHER): Payer: Medicare Other | Admitting: Family Medicine

## 2017-02-05 VITALS — BP 110/68 | HR 62 | Temp 98.3°F | Resp 16 | Ht 62.0 in | Wt 197.6 lb

## 2017-02-05 DIAGNOSIS — R739 Hyperglycemia, unspecified: Secondary | ICD-10-CM | POA: Diagnosis not present

## 2017-02-05 DIAGNOSIS — I10 Essential (primary) hypertension: Secondary | ICD-10-CM | POA: Diagnosis not present

## 2017-02-05 DIAGNOSIS — E782 Mixed hyperlipidemia: Secondary | ICD-10-CM | POA: Diagnosis not present

## 2017-02-05 DIAGNOSIS — R002 Palpitations: Secondary | ICD-10-CM

## 2017-02-05 MED ORDER — ZOSTER VAC RECOMB ADJUVANTED 50 MCG/0.5ML IM SUSR: 0.5000 mL | Freq: Once | INTRAMUSCULAR | 1 refills | Status: AC

## 2017-02-05 NOTE — Progress Notes (Signed)
Pre visit review using our clinic review tool, if applicable. No additional management support is needed unless otherwise documented below in the visit note. 

## 2017-02-05 NOTE — Assessment & Plan Note (Signed)
Well controlled, no changes to meds. Encouraged heart healthy diet such as the DASH diet and exercise as tolerated.  °

## 2017-02-05 NOTE — Assessment & Plan Note (Signed)
Tolerating statin, encouraged heart healthy diet, avoid trans fats, minimize simple carbs and saturated fats. Increase exercise as tolerated 

## 2017-02-05 NOTE — Assessment & Plan Note (Signed)
resolved 

## 2017-02-05 NOTE — Patient Instructions (Signed)
Palpitations A palpitation is the feeling that your heart:  Has an uneven (irregular) heartbeat.  Is beating faster than normal.  Is fluttering.  Is skipping a beat. This is usually not a serious problem. In some cases, you may need more medical tests. Follow these instructions at home:  Avoid:  Caffeine in coffee, tea, soft drinks, diet pills, and energy drinks.  Chocolate.  Alcohol.  Do not use any tobacco products. These include cigarettes, chewing tobacco, and e-cigarettes. If you need help quitting, ask your doctor.  Try to reduce your stress. These things may help:  Yoga.  Meditation.  Physical activity. Swimming, jogging, and walking are good choices.  A method that helps you use your mind to control things in your body, like heartbeats (biofeedback).  Get plenty of rest and sleep.  Take over-the-counter and prescription medicines only as told by your doctor.  Keep all follow-up visits as told by your doctor. This is important. Contact a doctor if:  Your heartbeat is still fast or uneven after 24 hours.  Your palpitations occur more often. Get help right away if:  You have chest pain.  You feel short of breath.  You have a very bad headache.  You feel dizzy.  You pass out (faint). This information is not intended to replace advice given to you by your health care provider. Make sure you discuss any questions you have with your health care provider. Document Released: 08/07/2008 Document Revised: 04/05/2016 Document Reviewed: 07/14/2015 Elsevier Interactive Patient Education  2017 [employer].

## 2017-02-05 NOTE — Progress Notes (Signed)
Subjective:  I acted as a Education administrator for Dr. Royden Purl, LPN    Patient ID: Megan Duncan, female    DOB: 10-02-1946, 71 y.o.   MRN: [id number]  Chief Complaint  Patient presents with  . Palpitations    follow up    HPI  Patient is in today for follow up fluttering sensation of heart. Patient report she has not experienced heart flutters since her last ov. Patient has no further concerns today.  Patient Care Team: Ann Held, DO as PCP - General (Family Medicine) Comer Locket, PA-C as Physician Assistant (Physician Assistant) Milus Banister, MD as Attending Physician (Gastroenterology)   Past Medical History:  Diagnosis Date  . Anxiety   . Arthritis    hands  . Bipolar II disorder ([place])    Dr. Charlott Holler  . COPD (chronic obstructive pulmonary disease) ([place])   . Depression   . Diastolic dysfunction    Per pt, diagnosed after [facility]  . Diverticulosis   . Fibromyalgia   . Hyperlipidemia   . Hypertension   . LBP (low back pain)     Past Surgical History:  Procedure Laterality Date  . APPENDECTOMY    . KNEE ARTHROSCOPY Right 2008  . TONSILLECTOMY    . TOTAL ABDOMINAL HYSTERECTOMY      Family History  Problem Relation Age of Onset  . Colon cancer Maternal Aunt 80  . Diabetes Father   . Hypertension Father   . Heart attack Father   . Alcoholism Father   . Hypertension Mother   . Hiatal hernia Mother   . Mental illness Sister     suicide  . Breast cancer Maternal Aunt     Social History   Social History  . Marital status: Married    Spouse name: N/A  . Number of children: 3  . Years of education: N/A   Occupational History  . retired    Social History Main Topics  . Smoking status: Former Smoker    Packs/day: 1.50    Years: 40.00    Quit date: 07/02/2004  . Smokeless tobacco: Never Used  . Alcohol use Yes     Comment: occasional wine  . Drug use: No  . Sexual activity: No   Other Topics Concern  . Not on file   Social History  Narrative   Lives with husband and grandson she is raising.    Outpatient Medications Prior to Visit  Medication Sig Dispense Refill  . albuterol (VENTOLIN HFA) 108 (90 BASE) MCG/ACT inhaler Inhale 2 puffs into the lungs every 4 (four) hours as needed. For shortness of breath and wheezing 18 g 3  . amLODipine (NORVASC) 5 MG tablet TAKE 1 TABLET EVERY DAY  (REPLACES LISINOPRIL/HCTZ) 90 tablet 1  . clorazepate (TRANXENE) 3.75 MG tablet Take 1 tablet (3.75 mg total) by mouth 2 (two) times daily as needed for anxiety. 90 tablet 1  . cyclobenzaprine (FLEXERIL) 10 MG tablet Take 1 tablet (10 mg total) by mouth 3 (three) times daily as needed. For muscle spasms 30 tablet 0  . DULoxetine (CYMBALTA) 20 MG capsule TAKE 2 CAPSULE BY MOUTH EVERY DAY 60 capsule 0  . fluocinonide cream (LIDEX) 6.50 % Apply 1 application topically 2 (two) times daily. (Patient taking differently: Apply 1 application topically as needed. ) 60 g 0  . hydrocortisone-pramoxine (ANALPRAM HC) 2.5-1 % rectal cream Place 1 application rectally 3 (three) times daily. (Patient taking differently: Place 1 application rectally as needed. )  30 g 3  . ipratropium (ATROVENT) 0.03 % nasal spray Place 2 sprays into both nostrils 3 (three) times daily. (Patient taking differently: Place 2 sprays into both nostrils as needed. ) 30 mL 6  . lithium carbonate 300 MG capsule TK 2 CS HS  1  . propranolol (INDERAL) 10 MG tablet Take 10 mg by mouth 2 (two) times daily.    . ranitidine (ZANTAC) 150 MG tablet Take 150 mg by mouth as needed.     . Tiotropium Bromide-Olodaterol (STIOLTO RESPIMAT) 2.5-2.5 MCG/ACT AERS Inhale 2 puffs into the lungs daily. 1 Inhaler 6  . lithium 300 MG tablet Take 300 mg by mouth 2 (two) times daily.    . Tiotropium Bromide Monohydrate (SPIRIVA RESPIMAT) 2.5 MCG/ACT AERS Inhale 2 puffs into the lungs daily.     No facility-administered medications prior to visit.     Allergies  Allergen Reactions  . Atorvastatin Other  (See Comments)    Significant rise in liver tests  . Codeine Nausea And Vomiting  . Diclofenac Other (See Comments)    Elevated LFT's  . Doxycycline     Extreme nausea  . Sulfonamide Derivatives Swelling    Review of Systems  Constitutional: Negative for fever and malaise/fatigue.  HENT: Negative for congestion.   Eyes: Negative for blurred vision.  Respiratory: Negative for cough and shortness of breath.   Cardiovascular: Negative for chest pain, palpitations and leg swelling.  Gastrointestinal: Negative for vomiting.  Musculoskeletal: Negative for back pain.  Skin: Negative for rash.  Neurological: Negative for loss of consciousness and headaches.  Psychiatric/Behavioral: Negative for depression. The patient is not nervous/anxious.        Objective:    Physical Exam  Constitutional: She is oriented to person, place, and time. She appears well-developed and well-nourished.  HENT:  Head: Normocephalic and atraumatic.  Eyes: Conjunctivae and EOM are normal.  Neck: Normal range of motion. Neck supple. No JVD present. Carotid bruit is not present. No thyromegaly present.  Cardiovascular: Normal rate, regular rhythm and normal heart sounds.   No murmur heard. Pulmonary/Chest: Effort normal and breath sounds normal. No respiratory distress. She has no wheezes. She has no rales. She exhibits no tenderness.  Musculoskeletal: She exhibits no edema.  Neurological: She is alert and oriented to person, place, and time.  Psychiatric: She has a normal mood and affect. Her behavior is normal. Judgment and thought content normal.  Nursing note and vitals reviewed.   BP 110/68 (BP Location: Left Arm, Patient Position: Sitting, Cuff Size: Large)   Pulse 62   Temp 98.3 F (36.8 C) (Oral)   Resp 16   Ht 5\' 2"  (1.575 m)   Wt 197 lb 9.6 oz (89.6 kg)   SpO2 97%   BMI 36.14 kg/m  Wt Readings from Last 3 Encounters:  02/05/17 197 lb 9.6 oz (89.6 kg)  01/10/17 196 lb 9.6 oz (89.2 kg)    10/30/16 199 lb (90.3 kg)   BP Readings from Last 3 Encounters:  02/05/17 110/68  01/10/17 132/82  10/30/16 126/88     Immunization History  Administered Date(s) Administered  . Influenza Whole 08/17/2009, 08/09/2010, 09/24/2012, 08/11/2013  . Influenza, High Dose Seasonal PF 09/12/2016  . Influenza,inj,Quad PF,36+ Mos 07/15/2014, 08/01/2015  . Pneumococcal Conjugate-13 07/15/2014  . Pneumococcal Polysaccharide-23 08/01/2015  . Zoster 09/24/2012    Health Maintenance  Topic Date Due  . Samul Dada  05/05/1965  . COLONOSCOPY  07/17/2015  . MAMMOGRAM  01/31/2018  . INFLUENZA VACCINE  Completed  . DEXA SCAN  Completed  . Hepatitis C Screening  Completed  . PNA vac Low Risk Adult  Completed    Lab Results  Component Value Date   WBC 6.9 10/18/2015   HGB 15.5 (H) 10/18/2015   HCT 47.0 (H) 10/18/2015   PLT 302.0 10/18/2015   GLUCOSE 109 (H) 07/20/2016   CHOL 285 (H) 07/06/2016   TRIG 98.0 07/06/2016   HDL 87.40 07/06/2016   LDLDIRECT 120.1 12/03/2013   LDLCALC 178 (H) 07/06/2016   ALT 15 07/06/2016   AST 20 07/06/2016   NA 136 07/20/2016   K 3.8 07/20/2016   CL 100 07/20/2016   CREATININE 0.77 07/20/2016   BUN 15 07/20/2016   CO2 29 07/20/2016   TSH 2.39 08/01/2015   INR 1.0 10/25/2015   HGBA1C 6.0 07/20/2016    Lab Results  Component Value Date   TSH 2.39 08/01/2015   Lab Results  Component Value Date   WBC 6.9 10/18/2015   HGB 15.5 (H) 10/18/2015   HCT 47.0 (H) 10/18/2015   MCV 94.8 10/18/2015   PLT 302.0 10/18/2015   Lab Results  Component Value Date   NA 136 07/20/2016   K 3.8 07/20/2016   CO2 29 07/20/2016   GLUCOSE 109 (H) 07/20/2016   BUN 15 07/20/2016   CREATININE 0.77 07/20/2016   BILITOT 0.3 07/06/2016   ALKPHOS 57 07/06/2016   AST 20 07/06/2016   ALT 15 07/06/2016   PROT 7.1 07/06/2016   ALBUMIN 4.2 07/06/2016   CALCIUM 9.2 07/20/2016   GFR 78.72 07/20/2016   Lab Results  Component Value Date   CHOL 285 (H) 07/06/2016   Lab  Results  Component Value Date   HDL 87.40 07/06/2016   Lab Results  Component Value Date   LDLCALC 178 (H) 07/06/2016   Lab Results  Component Value Date   TRIG 98.0 07/06/2016   Lab Results  Component Value Date   CHOLHDL 3 07/06/2016   Lab Results  Component Value Date   HGBA1C 6.0 07/20/2016         Assessment & Plan:   Problem List Items Addressed This Visit      Unprioritized   Essential hypertension    Well controlled, no changes to meds. Encouraged heart healthy diet such as the DASH diet and exercise as tolerated.       Relevant Orders   Lipid panel   Comprehensive metabolic panel   Hemoglobin A1c   Hyperlipidemia - Primary    Tolerating statin, encouraged heart healthy diet, avoid trans fats, minimize simple carbs and saturated fats. Increase exercise as tolerated      Relevant Orders   Lipid panel   Comprehensive metabolic panel   Hemoglobin A1c   Palpitations    resolved       Other Visit Diagnoses    Hyperglycemia       Relevant Orders   Comprehensive metabolic panel   Hemoglobin A1c      I have discontinued Ms. Yepiz's lithium and Tiotropium Bromide Monohydrate. I am also having her start on Zoster Vac Recomb Adjuvanted. Additionally, I am having her maintain her albuterol, cyclobenzaprine, fluocinonide cream, clorazepate, hydrocortisone-pramoxine, ranitidine, DULoxetine, Tiotropium Bromide-Olodaterol, ipratropium, amLODipine, lithium carbonate, and propranolol.  Meds ordered this encounter  Medications  . Zoster Vac Recomb Adjuvanted [facility]) injection    Sig: Inject 0.5 mLs into the muscle once.    Dispense:  0.5 mL    Refill:  1    CMA served  as scribe during this visit. History, Physical and Plan performed by medical provider. Documentation and orders reviewed and attested to.  Ann Held, DO   Patient ID: Megan Duncan, female   DOB: 12/12/1945, 71 y.o.   MRN: [id number]

## 2017-02-06 ENCOUNTER — Ambulatory Visit (INDEPENDENT_AMBULATORY_CARE_PROVIDER_SITE_OTHER): Payer: Medicare Other | Admitting: Adult Health

## 2017-02-06 ENCOUNTER — Encounter: Payer: Self-pay | Admitting: Adult Health

## 2017-02-06 DIAGNOSIS — J301 Allergic rhinitis due to pollen: Secondary | ICD-10-CM

## 2017-02-06 DIAGNOSIS — J449 Chronic obstructive pulmonary disease, unspecified: Secondary | ICD-10-CM

## 2017-02-06 NOTE — Assessment & Plan Note (Signed)
Add claritin and flonase   

## 2017-02-06 NOTE — Patient Instructions (Signed)
Stop Atrovent  Saline nasal spray As needed   Begin Flonase 1 puff Twice daily  .  Claritin 10mg  At bedtime  As needed  Drainage.  Continue on Stiolto 2 puffs daily .  Follow up with Dr. Lamonte Sakai  In 4 months and As needed

## 2017-02-06 NOTE — Progress Notes (Signed)
@Patient  ID: Megan Duncan, female    DOB: 11-02-1946, 71 y.o.   MRN: [id number]  Chief Complaint  Patient presents with  . Follow-up    COPD     Referring provider: Ann Held, *  HPI: 70 year old female former smoker followed for moderate COPD  TEST  Pulmonary function test 10/11/2016 FEV1 67%, ratio 64, FVC 79%, 16% positive bronchodilator response, Post BD , FEV1 77%, ratio 74,  DLCO 70%  02/06/2017 Follow up : COPD  Patient presents for a three-month follow-up. Patient was seen last visit and change from [employer] to [employer]. She feels she that is it helping . She can now get to top of steps much better now. Denies cough or wheezing . Trying to be more active. Going to [employer] .   She continues to have nasal drainage and post nasal drip . She does not like atrovent nasal spray . No fever or discolored mucus.   Allergies  Allergen Reactions  . Atorvastatin Other (See Comments)    Significant rise in liver tests  . Codeine Nausea And Vomiting  . Diclofenac Other (See Comments)    Elevated LFT's  . Doxycycline     Extreme nausea  . Sulfonamide Derivatives Swelling    Immunization History  Administered Date(s) Administered  . Influenza Whole 08/17/2009, 08/09/2010, 09/24/2012, 08/11/2013  . Influenza, High Dose Seasonal PF 09/12/2016  . Influenza,inj,Quad PF,36+ Mos 07/15/2014, 08/01/2015  . Pneumococcal Conjugate-13 07/15/2014  . Pneumococcal Polysaccharide-23 08/01/2015  . Zoster 09/24/2012    Past Medical History:  Diagnosis Date  . Anxiety   . Arthritis    hands  . Bipolar II disorder ([place])    Dr. Charlott Holler  . COPD (chronic obstructive pulmonary disease) ([place])   . Depression   . Diastolic dysfunction    Per pt, diagnosed after [facility]  . Diverticulosis   . Fibromyalgia   . Hyperlipidemia   . Hypertension   . LBP (low back pain)     Tobacco History: History  Smoking Status  . Former Smoker  . Packs/day: 1.50  . Years: 40.00  . Quit date:  07/02/2004  Smokeless Tobacco  . Never Used   Counseling given: Not Answered   Outpatient Encounter Prescriptions as of 02/06/2017  Medication Sig  . albuterol (VENTOLIN HFA) 108 (90 BASE) MCG/ACT inhaler Inhale 2 puffs into the lungs every 4 (four) hours as needed. For shortness of breath and wheezing  . amLODipine (NORVASC) 5 MG tablet TAKE 1 TABLET EVERY DAY  (REPLACES LISINOPRIL/HCTZ)  . clorazepate (TRANXENE) 3.75 MG tablet Take 1 tablet (3.75 mg total) by mouth 2 (two) times daily as needed for anxiety.  . cyclobenzaprine (FLEXERIL) 10 MG tablet Take 1 tablet (10 mg total) by mouth 3 (three) times daily as needed. For muscle spasms  . DULoxetine (CYMBALTA) 20 MG capsule TAKE 2 CAPSULE BY MOUTH EVERY DAY  . fluocinonide cream (LIDEX) 0.93 % Apply 1 application topically 2 (two) times daily. (Patient taking differently: Apply 1 application topically as needed. )  . hydrocortisone-pramoxine (ANALPRAM HC) 2.5-1 % rectal cream Place 1 application rectally 3 (three) times daily. (Patient taking differently: Place 1 application rectally as needed. )  . ipratropium (ATROVENT) 0.03 % nasal spray Place 2 sprays into both nostrils 3 (three) times daily. (Patient taking differently: Place 2 sprays into both nostrils as needed. )  . lithium carbonate 300 MG capsule TK 2 CS HS  . propranolol (INDERAL) 10 MG tablet Take 10 mg by mouth 2 (  two) times daily.  . ranitidine (ZANTAC) 150 MG tablet Take 150 mg by mouth as needed.   . Tiotropium Bromide-Olodaterol (STIOLTO RESPIMAT) 2.5-2.5 MCG/ACT AERS Inhale 2 puffs into the lungs daily.   No facility-administered encounter medications on file as of 02/06/2017.      Review of Systems  Constitutional:   No  weight loss, night sweats,  Fevers, chills, fatigue, or  lassitude.  HEENT:   No headaches,  Difficulty swallowing,  Tooth/dental problems, or  Sore throat,                No sneezing, itching, ear ache, nasal congestion, post nasal drip,   CV:  No  chest pain,  Orthopnea, PND, swelling in lower extremities, anasarca, dizziness, palpitations, syncope.   GI  No heartburn, indigestion, abdominal pain, nausea, vomiting, diarrhea, change in bowel habits, loss of appetite, bloody stools.   Resp:   No excess mucus, no productive cough,  No non-productive cough,  No coughing up of blood.  No change in color of mucus.  No wheezing.  No chest wall deformity  Skin: no rash or lesions.  GU: no dysuria, change in color of urine, no urgency or frequency.  No flank pain, no hematuria   MS:  No joint pain or swelling.  No decreased range of motion.  No back pain.    Physical Exam  BP 122/72 (BP Location: Left Arm, Patient Position: Sitting, Cuff Size: Large)   Pulse 61   Ht 5\' 2"  (1.575 m)   Wt 196 lb 12.8 oz (89.3 kg)   SpO2 95%   BMI 36.00 kg/m   GEN: A/Ox3; pleasant , NAD,obese    HEENT:  [place]/AT,  EACs-clear, TMs-wnl, NOSE-clear, THROAT-clear, no lesions, no postnasal drip or exudate noted.   NECK:  Supple w/ fair ROM; no JVD; normal carotid impulses w/o bruits; no thyromegaly or nodules palpated; no lymphadenopathy.    RESP  Clear  P & A; w/o, wheezes/ rales/ or rhonchi. no accessory muscle use, no dullness to percussion  CARD:  RRR, no m/r/g, no peripheral edema, pulses intact, no cyanosis or clubbing.  GI:   Soft & nt; nml bowel sounds; no organomegaly or masses detected.   Musco: Warm bil, no deformities or joint swelling noted.   Neuro: alert, no focal deficits noted.    Skin: Warm, no lesions or rashes    Lab Results:  CBCl  BNP No results found for: BNP  ProBNP No results found for: PROBNP  Imaging:    Assessment & Plan:   No problem-specific Assessment & Plan notes found for this encounter.     Rexene Edison, NP 02/06/2017

## 2017-02-06 NOTE — Assessment & Plan Note (Signed)
Moderate COPD well controlled with improved sx control on Stiolto   Plan  Patient Instructions  Stop Atrovent  Saline nasal spray As needed   Begin Flonase 1 puff Twice daily  .  Claritin 10mg  At bedtime  As needed  Drainage.  Continue on Stiolto 2 puffs daily .  Follow up with Dr. Lamonte Sakai  In 4 months and As needed

## 2017-02-12 ENCOUNTER — Other Ambulatory Visit (INDEPENDENT_AMBULATORY_CARE_PROVIDER_SITE_OTHER): Payer: Medicare Other

## 2017-02-12 DIAGNOSIS — I10 Essential (primary) hypertension: Secondary | ICD-10-CM | POA: Diagnosis not present

## 2017-02-12 DIAGNOSIS — R739 Hyperglycemia, unspecified: Secondary | ICD-10-CM | POA: Diagnosis not present

## 2017-02-12 DIAGNOSIS — E782 Mixed hyperlipidemia: Secondary | ICD-10-CM

## 2017-02-12 LAB — COMPREHENSIVE METABOLIC PANEL
ALBUMIN: 4.2 g/dL (ref 3.5–5.2)
ALK PHOS: 69 U/L (ref 39–117)
ALT: 13 U/L (ref 0–35)
AST: 17 U/L (ref 0–37)
BUN: 9 mg/dL (ref 6–23)
CO2: 29 mEq/L (ref 19–32)
Calcium: 9.4 mg/dL (ref 8.4–10.5)
Chloride: 105 mEq/L (ref 96–112)
Creatinine, Ser: 0.76 mg/dL (ref 0.40–1.20)
GFR: 79.79 mL/min (ref >60.00)
Glucose, Bld: 130 mg/dL — ABNORMAL HIGH (ref 70–99)
POTASSIUM: 4.1 meq/L (ref 3.5–5.1)
Sodium: 141 mEq/L (ref 135–145)
TOTAL PROTEIN: 7.4 g/dL (ref 6.0–8.3)
Total Bilirubin: 0.5 mg/dL (ref 0.2–1.2)

## 2017-02-12 LAB — LIPID PANEL
CHOL/HDL RATIO: 3
Cholesterol: 208 mg/dL — ABNORMAL HIGH (ref 0–200)
HDL: 62.9 mg/dL (ref >39.00)
LDL Cholesterol: 119 mg/dL — ABNORMAL HIGH (ref 0–99)
NonHDL: 144.89
TRIGLYCERIDES: 128 mg/dL (ref 0.0–149.0)
VLDL: 25.6 mg/dL (ref 0.0–40.0)

## 2017-02-12 LAB — HEMOGLOBIN A1C: HEMOGLOBIN A1C: 5.7 % (ref 4.6–6.5)

## 2017-02-12 NOTE — Addendum Note (Signed)
Addended by: Parke Poisson E on: 02/12/2017 05:31 PM   Modules accepted: Orders

## 2017-02-14 DIAGNOSIS — F3181 Bipolar II disorder: Secondary | ICD-10-CM | POA: Diagnosis not present

## 2017-03-18 DIAGNOSIS — H16223 Keratoconjunctivitis sicca, not specified as Sjogren's, bilateral: Secondary | ICD-10-CM | POA: Diagnosis not present

## 2017-05-09 DIAGNOSIS — F3181 Bipolar II disorder: Secondary | ICD-10-CM | POA: Diagnosis not present

## 2017-05-13 ENCOUNTER — Ambulatory Visit (HOSPITAL_BASED_OUTPATIENT_CLINIC_OR_DEPARTMENT_OTHER)
Admission: RE | Admit: 2017-05-13 | Discharge: 2017-05-13 | Disposition: A | Discharge status: Home / Self Care | Payer: Medicare Other | Source: Ambulatory Visit | Attending: Medical | Admitting: Medical

## 2017-05-13 ENCOUNTER — Ambulatory Visit (INDEPENDENT_AMBULATORY_CARE_PROVIDER_SITE_OTHER): Payer: Medicare Other | Admitting: Medical

## 2017-05-13 ENCOUNTER — Encounter: Payer: Self-pay | Admitting: Medical

## 2017-05-13 VITALS — BP 104/61 | HR 81 | Temp 98.8°F | Resp 16 | Ht 62.0 in | Wt 199.0 lb

## 2017-05-13 DIAGNOSIS — I700 Atherosclerosis of aorta: Secondary | ICD-10-CM | POA: Diagnosis not present

## 2017-05-13 DIAGNOSIS — R05 Cough: Secondary | ICD-10-CM | POA: Insufficient documentation

## 2017-05-13 DIAGNOSIS — R062 Wheezing: Secondary | ICD-10-CM

## 2017-05-13 DIAGNOSIS — J40 Bronchitis, not specified as acute or chronic: Secondary | ICD-10-CM

## 2017-05-13 DIAGNOSIS — R059 Cough, unspecified: Secondary | ICD-10-CM

## 2017-05-13 MED ORDER — BENZONATATE 100 MG PO CAPS: 100.0000 mg | ORAL_CAPSULE | Freq: Three times a day (TID) | ORAL | 0 refills | Status: DC | PRN

## 2017-05-13 MED ORDER — AZITHROMYCIN 250 MG PO TABS: ORAL_TABLET | ORAL | 0 refills | Status: DC

## 2017-05-13 MED ORDER — PREDNISONE 10 MG PO TABS: ORAL_TABLET | ORAL | 0 refills | Status: DC

## 2017-05-13 NOTE — Progress Notes (Signed)
Subjective:    Patient ID: Megan Duncan, female    DOB: 09/24/1946, 71 y.o.   MRN: [id number]  HPI  Pt in for recent sick this Thursday. She had some nasal and chest congestion. Pt has known copd and got brochitis symptoms late Friday and early Saturday. She states throat hurts and rib hurts when she coughs.  Pt has been taking tylenol for rib pain. She feels fatigue and she has been wheezing every day since Friday. She has not used albuterol until today. She improved a lot since am.   Review of Systems  Constitutional: Positive for fatigue. Negative for chills and fever.  HENT: Negative for congestion, ear pain, postnasal drip, sinus pain, sinus pressure and sore throat.   Respiratory: Positive for cough and wheezing. Negative for chest tightness and shortness of breath.   Cardiovascular: Negative for chest pain and palpitations.       Productive cough.  Gastrointestinal: Negative for abdominal pain, diarrhea, nausea and vomiting.  Musculoskeletal: Negative for back pain, myalgias, neck pain and neck stiffness.       Faint bodyaches. Worse is rib pain.  Skin: Negative for rash.  Neurological: Negative for dizziness, seizures, weakness and light-headedness.  Hematological: Negative for adenopathy. Does not bruise/bleed easily.  Psychiatric/Behavioral: Negative for behavioral problems and confusion.    Past Medical History:  Diagnosis Date  . Anxiety   . Arthritis    hands  . Bipolar II disorder ([place])    Dr. Charlott Holler  . COPD (chronic obstructive pulmonary disease) ([place])   . Depression   . Diastolic dysfunction    Per pt, diagnosed after [facility]  . Diverticulosis   . Fibromyalgia   . Hyperlipidemia   . Hypertension   . LBP (low back pain)      Social History   Social History  . Marital status: Married    Spouse name: N/A  . Number of children: 3  . Years of education: N/A   Occupational History  . retired    Social History Main Topics  . Smoking status:  Former Smoker    Packs/day: 1.50    Years: 40.00    Quit date: 07/02/2004  . Smokeless tobacco: Never Used  . Alcohol use Yes     Comment: occasional wine  . Drug use: No  . Sexual activity: No   Other Topics Concern  . Not on file   Social History Narrative   Lives with husband and grandson she is raising.    Past Surgical History:  Procedure Laterality Date  . APPENDECTOMY    . KNEE ARTHROSCOPY Right 2008  . TONSILLECTOMY    . TOTAL ABDOMINAL HYSTERECTOMY      Family History  Problem Relation Age of Onset  . Colon cancer Maternal Aunt 80  . Diabetes Father   . Hypertension Father   . Heart attack Father   . Alcoholism Father   . Hypertension Mother   . Hiatal hernia Mother   . Mental illness Sister        suicide  . Breast cancer Maternal Aunt     Allergies  Allergen Reactions  . Atorvastatin Other (See Comments)    Significant rise in liver tests  . Codeine Nausea And Vomiting  . Diclofenac Other (See Comments)    Elevated LFT's  . Doxycycline     Extreme nausea  . Sulfonamide Derivatives Swelling    Current Outpatient Prescriptions on File Prior to Visit  Medication Sig Dispense Refill  .  albuterol (VENTOLIN HFA) 108 (90 BASE) MCG/ACT inhaler Inhale 2 puffs into the lungs every 4 (four) hours as needed. For shortness of breath and wheezing 18 g 3  . amLODipine (NORVASC) 5 MG tablet TAKE 1 TABLET EVERY DAY  (REPLACES LISINOPRIL/HCTZ) 90 tablet 1  . clorazepate (TRANXENE) 3.75 MG tablet Take 1 tablet (3.75 mg total) by mouth 2 (two) times daily as needed for anxiety. 90 tablet 1  . cyclobenzaprine (FLEXERIL) 10 MG tablet Take 1 tablet (10 mg total) by mouth 3 (three) times daily as needed. For muscle spasms 30 tablet 0  . DULoxetine (CYMBALTA) 20 MG capsule TAKE 2 CAPSULE BY MOUTH EVERY DAY 60 capsule 0  . fluocinonide cream (LIDEX) 9.70 % Apply 1 application topically 2 (two) times daily. (Patient taking differently: Apply 1 application topically as needed.  ) 60 g 0  . hydrocortisone-pramoxine (ANALPRAM HC) 2.5-1 % rectal cream Place 1 application rectally 3 (three) times daily. (Patient taking differently: Place 1 application rectally as needed. ) 30 g 3  . lithium carbonate 300 MG capsule TK 2 CS HS  1  . propranolol (INDERAL) 10 MG tablet Take 10 mg by mouth 2 (two) times daily.    . ranitidine (ZANTAC) 150 MG tablet Take 150 mg by mouth as needed.     . Tiotropium Bromide-Olodaterol (STIOLTO RESPIMAT) 2.5-2.5 MCG/ACT AERS Inhale 2 puffs into the lungs daily. 1 Inhaler 6   No current facility-administered medications on file prior to visit.     BP 104/61 (BP Location: Right Arm, Patient Position: Sitting, Cuff Size: Large)   Pulse 81   Temp 98.8 F (37.1 C) (Oral)   Resp 16   Ht 5\' 2"  (1.575 m)   Wt 199 lb (90.3 kg)   SpO2 97%   BMI 36.40 kg/m       Objective:   Physical Exam  General  Mental Status - Alert. General Appearance - Well groomed. Not in acute distress.  Skin Rashes- No Rashes.  HEENT Head- Normal. Ear Auditory Canal - Left- Normal. Right - Normal.Tympanic Membrane- Left- Normal. Right- Normal. Eye Sclera/Conjunctiva- Left- Normal. Right- Normal. Nose & Sinuses Nasal Mucosa- Left-  Boggy and Congested. Right-  Boggy and  Congested.Bilateral no  maxillary and no  frontal sinus pressure. Mouth & Throat Lips: Upper Lip- Normal: no dryness, cracking, pallor, cyanosis, or vesicular eruption. Lower Lip-Normal: no dryness, cracking, pallor, cyanosis or vesicular eruption. Buccal Mucosa- Bilateral- No Aphthous ulcers. Oropharynx- No Discharge or Erythema. +pnd. Tonsils: Characteristics- Bilateral- No Erythema or Congestion. Size/Enlargement- Bilateral- No enlargement. Discharge- bilateral-None.  Neck Neck- Supple. No Masses.   Chest and Lung Exam Auscultation: Breath Sounds:-clear even and unlabored.But shallow.  Cardiovascular Auscultation:Rythm- Regular, rate and rhythm. Murmurs & Other Heart  Sounds:Ausculatation of the heart reveal- No Murmurs.  Lymphatic Head & Neck General Head & Neck Lymphatics: Bilateral: Description- No Localized lymphadenopathy.        Assessment & Plan:  You appear to have bronchitis with copd flare. Rest hydrate and tylenol for fever. I am prescribing cough medicine benzonatate, and azithromycin antibiotic.   For wheezing I am refilling you albuterol inhaler and adding a tapered prednisone course.   Please get cxr today.  Follow up in 7-10 days or as needed  Tramya Schoenfelder, Percell Miller, [employer]

## 2017-05-13 NOTE — Patient Instructions (Addendum)
You appear to have bronchitis with copd flare. Rest hydrate and tylenol for fever. I am prescribing cough medicine benzonatate, and azithromycin antibiotic.   For wheezing I am refilling you albuterol inhaler and adding a tapered prednisone course.   Please get cxr today.  Follow up in 7-10 days or as needed

## 2017-05-14 ENCOUNTER — Telehealth: Payer: Self-pay | Admitting: Family Medicine

## 2017-05-14 DIAGNOSIS — I700 Atherosclerosis of aorta: Secondary | ICD-10-CM

## 2017-05-14 HISTORY — DX: Atherosclerosis of aorta: I70.0

## 2017-05-14 NOTE — Telephone Encounter (Addendum)
Relation to SA:YTKZ Call back [phone] Pharmacy: [id number] - [place], [address] AT [address]     Reason for call:  Patient was seen by Percell Miller 05/13/17 due to brochities as per AVS albuterol (VENTOLIN HFA) 108 (90 BASE) MCG/ACT inhaler would be refill, requesting Rx please send to

## 2017-05-14 NOTE — Telephone Encounter (Signed)
Relation to pt: self  Call back number:[phone]   Reason for call:  Patient requesting orders for Basic metabolic panel and Hemoglobin A1c stating labs are re check every 3 month,please advise

## 2017-05-14 NOTE — Telephone Encounter (Signed)
Patient informed on last results 02/2017 she was to return in 6 months. She is going to check with psy  phsycian to see how often he wants liver enzymnes checked and will message PCP on mychart.

## 2017-05-15 ENCOUNTER — Telehealth: Payer: Self-pay | Admitting: Medical

## 2017-05-15 NOTE — Telephone Encounter (Signed)
Saw pt request for me to call her back on July 4 th. So will call her back when possible. Will you help remind me to call pt when I have some down time on Thursday or Friday.

## 2017-05-16 MED ORDER — ALBUTEROL SULFATE HFA 108 (90 BASE) MCG/ACT IN AERS: 2.0000 | INHALATION_SPRAY | RESPIRATORY_TRACT | 3 refills | Status: DC | PRN

## 2017-05-16 NOTE — Telephone Encounter (Signed)
Rx sent to pharmacy   

## 2017-05-17 ENCOUNTER — Telehealth: Payer: Self-pay | Admitting: Medical

## 2017-05-17 ENCOUNTER — Encounter: Payer: Self-pay | Admitting: Medical

## 2017-05-17 MED ORDER — ALBUTEROL SULFATE HFA 108 (90 BASE) MCG/ACT IN AERS: 2.0000 | INHALATION_SPRAY | RESPIRATORY_TRACT | 3 refills | Status: DC | PRN

## 2017-05-17 NOTE — Telephone Encounter (Signed)
Explained about xray findings and Dr. Etter Sjogren may put her statin. Pt expressed understanding.  She will discuss further with pcp.

## 2017-05-17 NOTE — Telephone Encounter (Signed)
Called in albuterol to pt pharmacy

## 2017-05-21 ENCOUNTER — Encounter: Payer: Self-pay | Admitting: Family Medicine

## 2017-05-21 ENCOUNTER — Ambulatory Visit (INDEPENDENT_AMBULATORY_CARE_PROVIDER_SITE_OTHER): Payer: Medicare Other | Admitting: Family Medicine

## 2017-05-21 VITALS — BP 136/80 | HR 87 | Temp 98.0°F | Resp 16 | Ht 62.0 in | Wt 199.6 lb

## 2017-05-21 DIAGNOSIS — J441 Chronic obstructive pulmonary disease with (acute) exacerbation: Secondary | ICD-10-CM

## 2017-05-21 DIAGNOSIS — R05 Cough: Secondary | ICD-10-CM | POA: Diagnosis not present

## 2017-05-21 DIAGNOSIS — R059 Cough, unspecified: Secondary | ICD-10-CM

## 2017-05-21 MED ORDER — IPRATROPIUM-ALBUTEROL 0.5-2.5 (3) MG/3ML IN SOLN
3.0000 mL | Freq: Once | RESPIRATORY_TRACT | Status: AC
  Administered 2017-05-21: 3 mL via RESPIRATORY_TRACT

## 2017-05-21 NOTE — Progress Notes (Signed)
Patient ID: Megan Duncan, female   DOB: 12-Nov-1946, 71 y.o.   MRN: [id number]     Subjective:  I acted as a Education administrator for Dr. Carollee Herter.  Guerry Bruin, [place]   Patient ID: Megan Duncan, female    DOB: August 06, 1946, 71 y.o.   MRN: [id number]  Chief Complaint  Patient presents with  . Cough    Cough  This is a new problem. Episode onset: 2 weeks ago. The cough is non-productive. Associated symptoms include headaches, postnasal drip, rhinorrhea, shortness of breath and wheezing. Pertinent negatives include no chest pain, chills, ear congestion, ear pain, fever, heartburn, hemoptysis, myalgias, nasal congestion or sore throat. Associated symptoms comments: Rib pain . She has tried prescription cough suppressant (tessalon perles) for the symptoms.    Patient is in today for cough.  Pt has finished the z pak and pred taper.  She does feel like she is getting better.  She is using her inhalers and has a f/u with pulmonary the week after next.    Patient Care Team: Carollee Herter, Alferd Apa, DO as PCP - General (Family Medicine) Luz Brazen as Physician Assistant (Physician Assistant) Milus Banister, MD as Attending Physician (Gastroenterology)   Past Medical History:  Diagnosis Date  . Anxiety   . Arthritis    hands  . Bipolar II disorder ([place])    Dr. Charlott Holler  . COPD (chronic obstructive pulmonary disease) ([place])   . Depression   . Diastolic dysfunction    Per pt, diagnosed after [facility]  . Diverticulosis   . Fibromyalgia   . Hyperlipidemia   . Hypertension   . LBP (low back pain)     Past Surgical History:  Procedure Laterality Date  . APPENDECTOMY    . KNEE ARTHROSCOPY Right 2008  . TONSILLECTOMY    . TOTAL ABDOMINAL HYSTERECTOMY      Family History  Problem Relation Age of Onset  . Colon cancer Maternal Aunt 80  . Diabetes Father   . Hypertension Father   . Heart attack Father   . Alcoholism Father   . Hypertension Mother   . Hiatal hernia Mother   . Mental  illness Sister        suicide  . Breast cancer Maternal Aunt     Social History   Social History  . Marital status: Married    Spouse name: N/A  . Number of children: 3  . Years of education: N/A   Occupational History  . retired    Social History Main Topics  . Smoking status: Former Smoker    Packs/day: 1.50    Years: 40.00    Quit date: 07/02/2004  . Smokeless tobacco: Never Used  . Alcohol use Yes     Comment: occasional wine  . Drug use: No  . Sexual activity: No   Other Topics Concern  . Not on file   Social History Narrative   Lives with husband and grandson she is raising.    Outpatient Medications Prior to Visit  Medication Sig Dispense Refill  . albuterol (VENTOLIN HFA) 108 (90 Base) MCG/ACT inhaler Inhale 2 puffs into the lungs every 4 (four) hours as needed. For shortness of breath and wheezing 18 g 3  . amLODipine (NORVASC) 5 MG tablet TAKE 1 TABLET EVERY DAY  (REPLACES LISINOPRIL/HCTZ) 90 tablet 1  . benzonatate (TESSALON) 100 MG capsule Take 1 capsule (100 mg total) by mouth 3 (three) times daily as needed. 21 capsule 0  . clorazepate (TRANXENE)  3.75 MG tablet Take 1 tablet (3.75 mg total) by mouth 2 (two) times daily as needed for anxiety. 90 tablet 1  . cyclobenzaprine (FLEXERIL) 10 MG tablet Take 1 tablet (10 mg total) by mouth 3 (three) times daily as needed. For muscle spasms 30 tablet 0  . DULoxetine (CYMBALTA) 20 MG capsule TAKE 2 CAPSULE BY MOUTH EVERY DAY 60 capsule 0  . hydrocortisone-pramoxine (ANALPRAM HC) 2.5-1 % rectal cream Place 1 application rectally 3 (three) times daily. (Patient taking differently: Place 1 application rectally as needed. ) 30 g 3  . lithium carbonate 300 MG capsule TK 2 CS HS  1  . propranolol (INDERAL) 10 MG tablet Take 10 mg by mouth 2 (two) times daily.    . ranitidine (ZANTAC) 150 MG tablet Take 150 mg by mouth as needed.     . Tiotropium Bromide-Olodaterol (STIOLTO RESPIMAT) 2.5-2.5 MCG/ACT AERS Inhale 2 puffs into  the lungs daily. 1 Inhaler 6  . azithromycin (ZITHROMAX) 250 MG tablet Take 2 tablets by mouth on day 1, followed by 1 tablet by mouth daily for 4 days. 6 tablet 0  . fluocinonide cream (LIDEX) 3.71 % Apply 1 application topically 2 (two) times daily. (Patient taking differently: Apply 1 application topically as needed. ) 60 g 0  . predniSONE (DELTASONE) 10 MG tablet 6 TAB PO DAY 1 5 TAB PO DAY 2 4 TAB PO DAY 3 3 TAB PO DAY 4 2 TAB PO DAY 5 1 TAB PO DAY 6 21 tablet 0   No facility-administered medications prior to visit.     Allergies  Allergen Reactions  . Atorvastatin Other (See Comments)    Significant rise in liver tests  . Codeine Nausea And Vomiting  . Diclofenac Other (See Comments)    Elevated LFT's  . Doxycycline     Extreme nausea  . Sulfonamide Derivatives Swelling    Review of Systems  Constitutional: Positive for malaise/fatigue. Negative for chills and fever.  HENT: Positive for postnasal drip and rhinorrhea. Negative for ear pain and sore throat.   Respiratory: Positive for cough, shortness of breath and wheezing. Negative for hemoptysis.        Duoneb given in the office and improved air movement Lungs clear   Cardiovascular: Negative for chest pain.  Gastrointestinal: Negative for heartburn.  Musculoskeletal: Negative for myalgias.  Neurological: Positive for headaches.       Objective:    Physical Exam  Constitutional: She is oriented to person, place, and time. She appears well-developed and well-nourished.  HENT:  Head: Normocephalic and atraumatic.  Eyes: Conjunctivae and EOM are normal.  Neck: Normal range of motion. Neck supple. No JVD present. Carotid bruit is not present. No thyromegaly present.  Cardiovascular: Normal rate, regular rhythm and normal heart sounds.   No murmur heard. Pulmonary/Chest: Effort normal. No respiratory distress. She has decreased breath sounds. She has wheezes. She has no rales. She exhibits no tenderness.    Musculoskeletal: She exhibits no edema.  Neurological: She is alert and oriented to person, place, and time.  Psychiatric: She has a normal mood and affect.  Nursing note and vitals reviewed.   BP 136/80 (BP Location: Left Arm, Cuff Size: Normal)   Pulse 87   Temp 98 F (36.7 C) (Oral)   Resp 16   Ht 5\' 2"  (1.575 m)   Wt 199 lb 9.6 oz (90.5 kg)   SpO2 97%   BMI 36.51 kg/m  Wt Readings from Last 3 Encounters:  05/21/17 199  lb 9.6 oz (90.5 kg)  05/13/17 199 lb (90.3 kg)  02/06/17 196 lb 12.8 oz (89.3 kg)   BP Readings from Last 3 Encounters:  05/21/17 136/80  05/13/17 104/61  02/06/17 122/72     Immunization History  Administered Date(s) Administered  . Influenza Whole 08/17/2009, 08/09/2010, 09/24/2012, 08/11/2013  . Influenza, High Dose Seasonal PF 09/12/2016  . Influenza,inj,Quad PF,36+ Mos 07/15/2014, 08/01/2015  . Pneumococcal Conjugate-13 07/15/2014  . Pneumococcal Polysaccharide-23 08/01/2015  . Zoster 09/24/2012    Health Maintenance  Topic Date Due  . Samul Dada  05/05/1965  . COLONOSCOPY  07/17/2015  . INFLUENZA VACCINE  06/12/2017  . MAMMOGRAM  01/31/2018  . DEXA SCAN  Completed  . Hepatitis C Screening  Completed  . PNA vac Low Risk Adult  Completed    Lab Results  Component Value Date   WBC 6.9 10/18/2015   HGB 15.5 (H) 10/18/2015   HCT 47.0 (H) 10/18/2015   PLT 302.0 10/18/2015   GLUCOSE 130 (H) 02/12/2017   CHOL 208 (H) 02/12/2017   TRIG 128.0 02/12/2017   HDL 62.90 02/12/2017   LDLDIRECT 120.1 12/03/2013   LDLCALC 119 (H) 02/12/2017   ALT 13 02/12/2017   AST 17 02/12/2017   NA 141 02/12/2017   K 4.1 02/12/2017   CL 105 02/12/2017   CREATININE 0.76 02/12/2017   BUN 9 02/12/2017   CO2 29 02/12/2017   TSH 2.39 08/01/2015   INR 1.0 10/25/2015   HGBA1C 5.7 02/12/2017    Lab Results  Component Value Date   TSH 2.39 08/01/2015   Lab Results  Component Value Date   WBC 6.9 10/18/2015   HGB 15.5 (H) 10/18/2015   HCT 47.0 (H)  10/18/2015   MCV 94.8 10/18/2015   PLT 302.0 10/18/2015   Lab Results  Component Value Date   NA 141 02/12/2017   K 4.1 02/12/2017   CO2 29 02/12/2017   GLUCOSE 130 (H) 02/12/2017   BUN 9 02/12/2017   CREATININE 0.76 02/12/2017   BILITOT 0.5 02/12/2017   ALKPHOS 69 02/12/2017   AST 17 02/12/2017   ALT 13 02/12/2017   PROT 7.4 02/12/2017   ALBUMIN 4.2 02/12/2017   CALCIUM 9.4 02/12/2017   GFR 79.79 02/12/2017   Lab Results  Component Value Date   CHOL 208 (H) 02/12/2017   Lab Results  Component Value Date   HDL 62.90 02/12/2017   Lab Results  Component Value Date   LDLCALC 119 (H) 02/12/2017   Lab Results  Component Value Date   TRIG 128.0 02/12/2017   Lab Results  Component Value Date   CHOLHDL 3 02/12/2017   Lab Results  Component Value Date   HGBA1C 5.7 02/12/2017   DG Chest 2 View (Accession [id number]) (Order [id number])  Imaging  Date: 05/13/2017 Department: [facility] [place] Released By: Margaretha Glassing Authorizing: Elise Benne  Exam Information   Status Exam Begun  Exam Ended   Final [99] 05/13/2017 3:17 PM 05/13/2017 3:21 PM  PACS Images   Show images for DG Chest 2 View  Study Result   CLINICAL DATA:  Chest pressure, cough, congestion, and COPD flare type symptoms. Former smoker.  EXAM: CHEST  2 VIEW  COMPARISON:  PA and lateral chest x-ray of July 20, 2016  FINDINGS: The lungs are well-expanded. The interstitial markings are coarse. There is no alveolar infiltrate. There is no pleural effusion. The heart and pulmonary vascularity are normal. The bony thorax exhibits no acute abnormality. There  is calcification in the wall of the aortic arch.  IMPRESSION: Chronic bronchitic changes, stable. No pneumonia, CHF, nor other acute cardiopulmonary abnormality.  Thoracic aortic atherosclerosis.   Electronically Signed   By: David  Martinique M.D.   On: 05/13/2017 15:25                              Assessment & Plan:   Problem List Items Addressed This Visit      Unprioritized   Bronchitis, chronic obstructive, with exacerbation ([place])    Pt finished z pak and pred taper duoneb given with relief Pt admits to not using inhalers appropriately-- she will start and f/u with pulm Call if any questions or concerns cxr reviewed as well       Relevant Medications   ipratropium-albuterol (DUONEB) 0.5-2.5 (3) MG/3ML nebulizer solution 3 mL (Completed)    Other Visit Diagnoses    Cough    -  Primary   Relevant Medications   ipratropium-albuterol (DUONEB) 0.5-2.5 (3) MG/3ML nebulizer solution 3 mL (Completed)      I have discontinued Ms. Keiper's fluocinonide cream, predniSONE, and azithromycin. I am also having her maintain her cyclobenzaprine, clorazepate, hydrocortisone-pramoxine, ranitidine, DULoxetine, Tiotropium Bromide-Olodaterol, amLODipine, lithium carbonate, propranolol, benzonatate, albuterol, and Pyridoxine HCl (VITAMIN B-6 PO). We administered ipratropium-albuterol.  Meds ordered this encounter  Medications  . Pyridoxine HCl (VITAMIN B-6 PO)    Sig: Take 1 tablet by mouth daily.  Marland Kitchen ipratropium-albuterol (DUONEB) 0.5-2.5 (3) MG/3ML nebulizer solution 3 mL    CMA served as scribe during this visit. History, Physical and Plan performed by medical provider. Documentation and orders reviewed and attested to.  Ann Held, DO

## 2017-05-21 NOTE — Patient Instructions (Signed)
Chronic Obstructive Pulmonary Disease Chronic obstructive pulmonary disease (COPD) is a common lung condition in which airflow from the lungs is limited. COPD is a general term that can be used to describe many different lung problems that limit airflow, including both chronic bronchitis and emphysema. If you have COPD, your lung function will probably never return to normal, but there are measures you can take to improve lung function and make yourself feel better. What are the causes?  Smoking (common).  Exposure to secondhand smoke.  Genetic problems.  Chronic inflammatory lung diseases or recurrent infections. What are the signs or symptoms?  Shortness of breath, especially with physical activity.  Deep, persistent (chronic) cough with a large amount of thick mucus.  Wheezing.  Rapid breaths (tachypnea).  Gray or bluish discoloration (cyanosis) of the skin, especially in your fingers, toes, or lips.  Fatigue.  Weight loss.  Frequent infections or episodes when breathing symptoms become much worse (exacerbations).  Chest tightness. How is this diagnosed? Your health care provider will take a medical history and perform a physical examination to diagnose COPD. Additional tests for COPD may include:  Lung (pulmonary) function tests.  Chest X-ray.  CT scan.  Blood tests. How is this treated? Treatment for COPD may include:  Inhaler and nebulizer medicines. These help manage the symptoms of COPD and make your breathing more comfortable.  Supplemental oxygen. Supplemental oxygen is only helpful if you have a low oxygen level in your blood.  Exercise and physical activity. These are beneficial for nearly all people with COPD.  Lung surgery or transplant.  Nutrition therapy to gain weight, if you are underweight.  Pulmonary rehabilitation. This may involve working with a team of health care providers and specialists, such as respiratory, occupational, and physical  therapists. Follow these instructions at home:  Take all medicines (inhaled or pills) as directed by your health care provider.  Avoid over-the-counter medicines or cough syrups that dry up your airway (such as antihistamines) and slow down the elimination of secretions unless instructed otherwise by your health care provider.  If you are a smoker, the most important thing that you can do is stop smoking. Continuing to smoke will cause further lung damage and breathing trouble. Ask your health care provider for help with quitting smoking. He or she can direct you to community resources or hospitals that provide support.  Avoid exposure to irritants such as smoke, chemicals, and fumes that aggravate your breathing.  Use oxygen therapy and pulmonary rehabilitation if directed by your health care provider. If you require home oxygen therapy, ask your health care provider whether you should purchase a pulse oximeter to measure your oxygen level at home.  Avoid contact with individuals who have a contagious illness.  Avoid extreme temperature and humidity changes.  Eat healthy foods. Eating smaller, more frequent meals and resting before meals may help you maintain your strength.  Stay active, but balance activity with periods of rest. Exercise and physical activity will help you maintain your ability to do things you want to do.  Preventing infection and hospitalization is very important when you have COPD. Make sure to receive all the vaccines your health care provider recommends, especially the pneumococcal and influenza vaccines. Ask your health care provider whether you need a pneumonia vaccine.  Learn and use relaxation techniques to manage stress.  Learn and use controlled breathing techniques as directed by your health care provider. Controlled breathing techniques include: 1. Pursed lip breathing. Start by breathing in (inhaling)   through your nose for 1 second. Then, purse your lips as  if you were going to whistle and breathe out (exhale) through the pursed lips for 2 seconds. 2. Diaphragmatic breathing. Start by putting one hand on your abdomen just above your waist. Inhale slowly through your nose. The hand on your abdomen should move out. Then purse your lips and exhale slowly. You should be able to feel the hand on your abdomen moving in as you exhale.  Learn and use controlled coughing to clear mucus from your lungs. Controlled coughing is a series of short, progressive coughs. The steps of controlled coughing are: 1. Lean your head slightly forward. 2. Breathe in deeply using diaphragmatic breathing. 3. Try to hold your breath for 3 seconds. 4. Keep your mouth slightly open while coughing twice. 5. Spit any mucus out into a tissue. 6. Rest and repeat the steps once or twice as needed. Contact a health care provider if:  You are coughing up more mucus than usual.  There is a change in the color or thickness of your mucus.  Your breathing is more labored than usual.  Your breathing is faster than usual. Get help right away if:  You have shortness of breath while you are resting.  You have shortness of breath that prevents you from:  Being able to talk.  Performing your usual physical activities.  You have chest pain lasting longer than 5 minutes.  Your skin color is more cyanotic than usual.  You measure low oxygen saturations for longer than 5 minutes with a pulse oximeter. This information is not intended to replace advice given to you by your health care provider. Make sure you discuss any questions you have with your health care provider. Document Released: 08/08/2005 Document Revised: 04/05/2016 Document Reviewed: 06/25/2013 Elsevier Interactive Patient Education  2017 [employer] Inc[employer]  

## 2017-05-22 DIAGNOSIS — J441 Chronic obstructive pulmonary disease with (acute) exacerbation: Secondary | ICD-10-CM | POA: Insufficient documentation

## 2017-05-22 NOTE — Assessment & Plan Note (Signed)
Pt finished z pak and pred taper duoneb given with relief Pt admits to not using inhalers appropriately-- she will start and f/u with pulm Call if any questions or concerns cxr reviewed as well

## 2017-05-23 ENCOUNTER — Ambulatory Visit (INDEPENDENT_AMBULATORY_CARE_PROVIDER_SITE_OTHER): Payer: Medicare Other | Admitting: Pulmonary Disease

## 2017-05-23 ENCOUNTER — Other Ambulatory Visit: Payer: Medicare Other

## 2017-05-23 ENCOUNTER — Encounter: Payer: Self-pay | Admitting: Pulmonary Disease

## 2017-05-23 VITALS — BP 124/72 | HR 78 | Ht 62.0 in | Wt 198.2 lb

## 2017-05-23 DIAGNOSIS — J441 Chronic obstructive pulmonary disease with (acute) exacerbation: Secondary | ICD-10-CM

## 2017-05-23 DIAGNOSIS — J309 Allergic rhinitis, unspecified: Secondary | ICD-10-CM

## 2017-05-23 MED ORDER — AEROCHAMBER MV MISC: 0 refills | Status: DC

## 2017-05-23 MED ORDER — BUDESONIDE-FORMOTEROL FUMARATE 160-4.5 MCG/ACT IN AERO: 2.0000 | INHALATION_SPRAY | Freq: Two times a day (BID) | RESPIRATORY_TRACT | 0 refills | Status: DC

## 2017-05-23 NOTE — Patient Instructions (Signed)
   We are keeping your 7/23 appointment with Dr. Lamonte Sakai.   Use the Symbicort inhaler we are giving you with your spacer in place of Stiolto. Inhale 2 puffs twice daily until you come back to see Dr. Lamonte Sakai.  Remember to remove any dentures or partials you have before you use your inhaler. Remember to brush your teeth & tongue after you use your inhaler as well as rinse, gargle & spit to keep from getting thrush in your mouth or on your tongue (a white film).   Call our office if you have any new breathing problems or questions before your next appointment.  TESTS ORDERED: 1. Serum Alpha-1 Antitrypsin Phenotype & RAST Panel.

## 2017-05-23 NOTE — Progress Notes (Signed)
Subjective:    Patient ID: Megan Duncan, female    DOB: [birth year], 71 y.o.   MRN: [id number]  [facility].:  Acute visit with known Moderate COPD & Chronic Allergic Rhinitis.  HPI She reports she has had a "COPD flare" with acute bronchitis for 3 weeks. She has been treated with a course of Prednisone & a Z-pak for 7 days total. She does have a sick contact through her granddaughter who had a cough. She does feel like her cough frequency is decreasing. Her cough remains nonproductive.   Moderate COPD: At last appointment in March patient had improved control of symptoms on Stiolto. She reports she is not using her Stiolto daily because she feels it makes her feel more "congested". She is using her Albuterol inhaler more frequently lately - up to twice daily. She is waking up coughing at night but feels this is improving somewhat.   Chronic allergic rhinitis: Flonase and Claritin added to patient's regimen at last appointment.  She reports she has constant sinus drainage that is relatively unchanged. She is not on any new medications. She doesn't notice a seasonal variation to her sinus drainage.  Review of Systems Previously had diffuse myalgias and arthralgias that have resolved. She reports she has pain in her back with her coughing. No fever, chills, or sweats. No abdominal pain, emesis or diarrhea. She has had some mild nausea.   Allergies  Allergen Reactions  . Atorvastatin Other (See Comments)    Significant rise in liver tests  . Codeine Nausea And Vomiting  . Diclofenac Other (See Comments)    Elevated LFT's  . Doxycycline     Extreme nausea  . Sulfonamide Derivatives Swelling    Current Outpatient Prescriptions on File Prior to Visit  Medication Sig Dispense Refill  . albuterol (VENTOLIN HFA) 108 (90 Base) MCG/ACT inhaler Inhale 2 puffs into the lungs every 4 (four) hours as needed. For shortness of breath and wheezing 18 g 3  . amLODipine (NORVASC) 5 MG tablet TAKE 1 TABLET  EVERY DAY  (REPLACES LISINOPRIL/HCTZ) 90 tablet 1  . benzonatate (TESSALON) 100 MG capsule Take 1 capsule (100 mg total) by mouth 3 (three) times daily as needed. 21 capsule 0  . clorazepate (TRANXENE) 3.75 MG tablet Take 1 tablet (3.75 mg total) by mouth 2 (two) times daily as needed for anxiety. 90 tablet 1  . cyclobenzaprine (FLEXERIL) 10 MG tablet Take 1 tablet (10 mg total) by mouth 3 (three) times daily as needed. For muscle spasms 30 tablet 0  . DULoxetine (CYMBALTA) 20 MG capsule TAKE 2 CAPSULE BY MOUTH EVERY DAY 60 capsule 0  . hydrocortisone-pramoxine (ANALPRAM HC) 2.5-1 % rectal cream Place 1 application rectally 3 (three) times daily. (Patient taking differently: Place 1 application rectally as needed. ) 30 g 3  . lithium carbonate 300 MG capsule TK 2 CS HS  1  . propranolol (INDERAL) 10 MG tablet Take 10 mg by mouth 2 (two) times daily.    . Pyridoxine HCl (VITAMIN B-6 PO) Take 1 tablet by mouth daily.    . ranitidine (ZANTAC) 150 MG tablet Take 150 mg by mouth as needed.     . Tiotropium Bromide-Olodaterol (STIOLTO RESPIMAT) 2.5-2.5 MCG/ACT AERS Inhale 2 puffs into the lungs daily. 1 Inhaler 6   No current facility-administered medications on file prior to visit.     Past Medical History:  Diagnosis Date  . Anxiety   . Arthritis    hands  . Bipolar II disorder (  [place])    Dr. Charlott Holler  . COPD (chronic obstructive pulmonary disease) ([place])   . Depression   . Diastolic dysfunction    Per pt, diagnosed after [facility]  . Diverticulosis   . Fibromyalgia   . Hyperlipidemia   . Hypertension   . LBP (low back pain)     Past Surgical History:  Procedure Laterality Date  . APPENDECTOMY    . KNEE ARTHROSCOPY Right 2008  . TONSILLECTOMY    . TOTAL ABDOMINAL HYSTERECTOMY      Family History  Problem Relation Age of Onset  . Colon cancer Maternal Aunt 80  . Diabetes Father   . Hypertension Father   . Heart attack Father   . Alcoholism Father   . Hypertension Mother   . Hiatal  hernia Mother   . Mental illness Sister        suicide  . Breast cancer Maternal Aunt     Social History   Social History  . Marital status: Married    Spouse name: N/A  . Number of children: 3  . Years of education: N/A   Occupational History  . retired    Social History Main Topics  . Smoking status: Former Smoker    Packs/day: 1.50    Years: 40.00    Quit date: 07/02/2004  . Smokeless tobacco: Never Used  . Alcohol use Yes     Comment: occasional wine  . Drug use: No  . Sexual activity: No   Other Topics Concern  . Not on file   Social History Narrative   Lives with husband and grandson she is raising.      Objective:   Physical Exam BP 124/72 (BP Location: Right Arm, Patient Position: Sitting, Cuff Size: Large)   Pulse 78   Ht 5\' 2"  (1.575 m)   Wt 198 lb 3.2 oz (89.9 kg)   BMI 36.25 kg/m  General:  Awake. Alert. No acute distress. Obese female. Integument:  Warm & dry. No rash on exposed skin. Extremities:  No cyanosis or clubbing.  HEENT:  Moist mucus membranes. No oral ulcers. Minimal nasal turbinate swelling. No scleral icterus. Cardiovascular:  Regular rate. No edema. Normal S1 & S2.  Pulmonary:  Good aeration & clear to auscultation bilaterally. Symmetric chest wall expansion. No accessory muscle use on room air. Abdomen: Soft. Normal bowel sounds. Protuberant. Musculoskeletal:  Normal bulk and tone. No joint deformity or effusion appreciated.  PFT 10/11/16: FVC 2.07 L (79%) FEV1 1.33 L (67%) FEV1/FVC 0.64 FEF 25-75 0.63 L (36%) positive bronchodilator response TLC 3.94 L (85%) RV 87% ERV 28% DLCO uncorrected 70%   IMAGING CXR PA/LAT 05/13/17 (personally reviewed by me):  No parenchymal nodule or opacity appreciated. No pleural effusion. Heart normal in size & mediastinum normal in contour.     Assessment & Plan:  71 y.o. female with relatively new diagnosis of moderate COPD. Reviewing patient's previous pulmonary function testing from November does  indicate moderate airways obstruction with a significant bronchodilator response. She has no signs that would suggest emphysema on her lung volumes or with her carbon dioxide diffusion capacity. It's likely this is from her prior tobacco use, but alpha-1 antitrypsin deficiency must be ruled out. Certainly there could be some element of asthma overlap with her previous diagnosis of chronic allergic rhinitis. She may benefit from additional medications added to her regimen but would prefer to try an inhaled corticosteroid first. This most recent exacerbation is likely precipitated by a viral prodrome. I instructed  the patient to contact our office if she had any new breathing problems or questions before next appointment.  1. Moderate COPD with acute exacerbation: Switching from Stiolto to Symbicort 160/4.5 with spacer. Screening for alpha-1 antitrypsin deficiency. 2. Chronic allergic rhinitis: Question this diagnosis. Checking serum RAST panel. Holding off on initiating any treatment at this time. 3. Follow-up: Patient will keep appointment on 7/23 with Dr. Lamonte Sakai as planned.   Sonia Baller Ashok Cordia, M.D. [facility] Pager:  [phone] After 3pm or if no response, call [phone] 9:27 AM 05/23/17

## 2017-05-23 NOTE — Addendum Note (Signed)
Addended by: Tyson Dense on: 05/23/2017 09:58 AM   Modules accepted: Orders

## 2017-05-23 NOTE — Progress Notes (Signed)
Patient seen in the office today and instructed on use of Symbicort 160 and Spacer.  Patient expressed understanding and demonstrated technique.

## 2017-05-24 LAB — RESPIRATORY ALLERGY PROFILE REGION II ~~LOC~~
Allergen, C. Herbarum, M2: <0.1 kU/L
Allergen, Cedar tree, t12: <0.1 kU/L
Allergen, Cottonwood, t14: <0.1 kU/L
Allergen, D pternoyssinus,d7: 0.35 kU/L — ABNORMAL HIGH
Allergen, Mouse Urine Protein, e78: <0.1 kU/L
Allergen, Mulberry, t76: <0.1 kU/L
Allergen, P. notatum, m1: <0.1 kU/L
Aspergillus fumigatus, m3: <0.1 kU/L
Bermuda Grass: <0.1 kU/L
CAT DANDER: 0.15 kU/L — AB
COCKROACH: 0.12 kU/L — AB
Common Ragweed: <0.1 kU/L
D. farinae: 0.54 kU/L — ABNORMAL HIGH
IGE (IMMUNOGLOBULIN E), SERUM: 331 kU/L — AB (ref <115)
Johnson Grass: <0.1 kU/L
Pecan/Hickory Tree IgE: <0.1 kU/L
Timothy Grass: <0.1 kU/L

## 2017-05-29 ENCOUNTER — Other Ambulatory Visit: Payer: Self-pay | Admitting: Family Medicine

## 2017-05-29 LAB — ALPHA-1 ANTITRYPSIN PHENOTYPE: A1 ANTITRYPSIN: 162 mg/dL (ref 83–199)

## 2017-06-03 ENCOUNTER — Ambulatory Visit (INDEPENDENT_AMBULATORY_CARE_PROVIDER_SITE_OTHER): Payer: Medicare Other | Admitting: Emergency Medicine

## 2017-06-03 ENCOUNTER — Encounter: Payer: Self-pay | Admitting: Emergency Medicine

## 2017-06-03 VITALS — BP 140/70 | HR 75 | Ht 62.5 in | Wt 199.4 lb

## 2017-06-03 DIAGNOSIS — J301 Allergic rhinitis due to pollen: Secondary | ICD-10-CM | POA: Diagnosis not present

## 2017-06-03 DIAGNOSIS — J449 Chronic obstructive pulmonary disease, unspecified: Secondary | ICD-10-CM

## 2017-06-03 NOTE — Assessment & Plan Note (Signed)
Temporarily stop Symbicort for the next two weeks. At that time consider restarting it, especially if your breathing misses it. Rinse after taking. We may need to change the BD at some point in future. She did not like Stiolto

## 2017-06-03 NOTE — Progress Notes (Signed)
Subjective:    Patient ID: Megan Duncan, female    DOB: [birth year], 71 y.o.   MRN: [id number]  HPI 71 yo former smoker (60 pack-yrs) with hx HTN, diastolic dysfxn, obesity. Has hx transaminitis that was ascribed to meds. She has been experiencing dyspnea on exertion. Has been going on for several years, slowly progressive. Now notes that she cannot do stairs without SOB. Happens also with speaking a lot. Occasionally has felt tightness in her chest. She describes low energy, knows that she needs to increase her exercise, lose weight. She was started on Breo, Symbicort for a trial a week, not sure it helped her. She does use albuterol prn, seems to have helped her although she uses rarely. She has some dry cough, less since stopping her ACE-I. She does have chronic nasal gtt to her throat.   ROV 10/30/16 -- This follow-up visit for multifactorial dyspnea in the setting of obesity, deconditioning and probable COPD. She also has a history of nasal drip and upper airway irritation with associated cough. At her last visit we initiated Spiriva Respimat to see if she would benefit. We also started loratadine 10 mg daily. Pulmonary function testing was performed on 10/11/16 that I personally reviewed. These show moderately severe obstruction with a positive bronchodilator response, normal lung volumes consistent with probable superimposed restriction, and a decreased diffusion capacity that corrects to the normal range when adjusted for alveolar volume. She believes that she can tell an improvement in her breathing since addition of spiriva. Her cough has been better, but still with nasal gtt.   ROV 06/03/17 -- Patient has a history of multifactorial dyspnea with moderately severe obstruction and a positive bronchodilator response on spirometry. She also has chronic upper airway irritation with nasal drip and associated chronic cough. She has been on Stiolto since March - felt that she might be having side  effects from it. She was treated the beginning of July for a suspected bronchitis/acute exacerbation. At follow-up visit here on 7/12 her Stiolto was changed to Symbicort. She is also currently on zantac,  . 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 Arrest panel was performed that showed no elevated IgE, sensitivities to cockroaches, cat dander and others. She is no longer on allergy regimen.    Review of Systems  Constitutional: Negative for fever and unexpected weight change.  HENT: Negative for congestion, dental problem, ear pain, nosebleeds, postnasal drip, rhinorrhea, sinus pressure, sneezing, sore throat and trouble swallowing.   Eyes: Negative for redness and itching.  Respiratory: Positive for cough and shortness of breath. Negative for chest tightness and wheezing.   Cardiovascular: Positive for palpitations. Negative for leg swelling.  Gastrointestinal: Negative for nausea and vomiting.  Genitourinary: Negative for dysuria.  Musculoskeletal: Negative for joint swelling.  Skin: Negative for rash.  Neurological: Negative for headaches.  Hematological: Does not bruise/bleed easily.  Psychiatric/Behavioral: Positive for dysphoric mood. The patient is nervous/anxious.     Past Medical History:  Diagnosis Date  . Anxiety   . Arthritis    hands  . Bipolar II disorder ([place])    Dr. Charlott Holler  . COPD (chronic obstructive pulmonary disease) ([place])   . Depression   . Diastolic dysfunction    Per pt, diagnosed after [facility]  . Diverticulosis   . Fibromyalgia   . Hyperlipidemia   . Hypertension   . LBP (low back pain)      Family History  Problem Relation Age of Onset  . Colon cancer Maternal Aunt 80  .  Diabetes Father   . Hypertension Father   . Heart attack Father   . Alcoholism Father   . Hypertension Mother   . Hiatal hernia Mother   . Mental illness Sister        suicide  . Breast cancer Maternal Aunt      Social History   Social History  . Marital status: Married     Spouse name: N/A  . Number of children: 3  . Years of education: N/A   Occupational History  . retired    Social History Main Topics  . Smoking status: Former Smoker    Packs/day: 1.50    Years: 40.00    Quit date: 07/02/2004  . Smokeless tobacco: Never Used  . Alcohol use Yes     Comment: occasional wine  . Drug use: No  . Sexual activity: No   Other Topics Concern  . Not on file   Social History Narrative   Lives with husband and grandson she is raising.     Allergies  Allergen Reactions  . Atorvastatin Other (See Comments)    Significant rise in liver tests  . Codeine Nausea And Vomiting  . Diclofenac Other (See Comments)    Elevated LFT's  . Doxycycline     Extreme nausea  . Sulfonamide Derivatives Swelling     Outpatient Medications Prior to Visit  Medication Sig Dispense Refill  . albuterol (VENTOLIN HFA) 108 (90 Base) MCG/ACT inhaler Inhale 2 puffs into the lungs every 4 (four) hours as needed. For shortness of breath and wheezing 18 g 3  . amLODipine (NORVASC) 5 MG tablet TAKE 1 TABLET EVERY DAY  (REPLACES LISINOPRIL/HCTZ) 90 tablet 1  . benzonatate (TESSALON) 100 MG capsule Take 1 capsule (100 mg total) by mouth 3 (three) times daily as needed. 21 capsule 0  . budesonide-formoterol (SYMBICORT) 160-4.5 MCG/ACT inhaler Inhale 2 puffs into the lungs 2 (two) times daily. 1 Inhaler 0  . clorazepate (TRANXENE) 3.75 MG tablet Take 1 tablet (3.75 mg total) by mouth 2 (two) times daily as needed for anxiety. 90 tablet 1  . cyclobenzaprine (FLEXERIL) 10 MG tablet Take 1 tablet (10 mg total) by mouth 3 (three) times daily as needed. For muscle spasms 30 tablet 0  . DULoxetine (CYMBALTA) 20 MG capsule TAKE 2 CAPSULE BY MOUTH EVERY DAY 60 capsule 0  . hydrocortisone-pramoxine (ANALPRAM HC) 2.5-1 % rectal cream Place 1 application rectally 3 (three) times daily. (Patient taking differently: Place 1 application rectally as needed. ) 30 g 3  . lithium carbonate 300 MG capsule  TK 2 CS HS  1  . propranolol (INDERAL) 10 MG tablet Take 10 mg by mouth 2 (two) times daily.    . Pyridoxine HCl (VITAMIN B-6 PO) Take 1 tablet by mouth daily.    . ranitidine (ZANTAC) 150 MG tablet Take 150 mg by mouth as needed.     Marland Kitchen Spacer/Aero-Holding Chambers (AEROCHAMBER MV) inhaler Use as instructed 1 each 0   No facility-administered medications prior to visit.         Objective:   Physical Exam Vitals:   06/03/17 1038  BP: 140/70  Pulse: 75  SpO2: 96%  Weight: 199 lb 6.4 oz (90.4 kg)  Height: 5' 2.5" (1.588 m)   Gen: Pleasant, overwt woman, in no distress,  normal affect  ENT: No lesions,  mouth clear,  oropharynx clear, no postnasal drip  Neck: No JVD, no TMG, no carotid bruits  Lungs: No use of accessory muscles,  clear without rales or rhonchi  Cardiovascular: RRR, heart sounds normal, no murmur or gallops, no peripheral edema  Musculoskeletal: No deformities, no cyanosis or clubbing  Neuro: alert, non focal  Skin: Warm, no lesions or rashes   TTE 08/02/16 -  Study Conclusions  - Left ventricle: The cavity size was normal. Wall thickness was   normal. Systolic function was normal. The estimated ejection   fraction was in the range of 55% to 60%. Doppler parameters are   consistent with abnormal left ventricular relaxation (grade 1   diastolic dysfunction). - normal RV size and fxn - normal RA      Assessment & Plan:  Allergic rhinitis Restart loratadine 10mg  daily for 1 month. You can use as needed thereafter.  Restart fluticasone nasal spray 2 sprays each side daily for a month. Then change to as needed.  If your cough persists after two weeks then start zantac 150mg  daily to see if this helps.  Follow with Dr Lamonte Sakai in 3 months or sooner if you have any problems.  COPD (chronic obstructive pulmonary disease) (HCC) Temporarily stop Symbicort for the next two weeks. At that time consider restarting it, especially if your breathing misses it. Rinse  after taking. We may need to change the BD at some point in future. She did not like Stiolto   Baltazar Apo, MD, PhD 06/03/2017, 11:06 AM [facility] [phone] or if no answer [phone]

## 2017-06-03 NOTE — Assessment & Plan Note (Signed)
Restart loratadine 10mg  daily for 1 month. You can use as needed thereafter.  Restart fluticasone nasal spray 2 sprays each side daily for a month. Then change to as needed.  If your cough persists after two weeks then start zantac 150mg  daily to see if this helps.  Follow with Dr Lamonte Sakai in 3 months or sooner if you have any problems.

## 2017-06-03 NOTE — Patient Instructions (Addendum)
Restart loratadine 10mg  daily for 1 month. You can use as needed thereafter.  Restart fluticasone nasal spray 2 sprays each side daily for a month. Then change to as needed.  Temporarily stop Symbicort for the next two weeks. At that time consider restarting it, especially if your breathing misses it. Rinse after taking.  If your cough persists after two weeks then start zantac 150mg  daily to see if this helps.  Keep albuterol available to use as needed.  Follow with Dr Lamonte Sakai in 3 months or sooner if you have any problems.

## 2017-06-27 DIAGNOSIS — F3181 Bipolar II disorder: Secondary | ICD-10-CM | POA: Diagnosis not present

## 2017-08-12 ENCOUNTER — Telehealth: Payer: Self-pay | Admitting: Family Medicine

## 2017-08-12 NOTE — Telephone Encounter (Signed)
Completed.

## 2017-08-15 ENCOUNTER — Encounter: Payer: Medicare Other | Admitting: Family Medicine

## 2017-08-19 ENCOUNTER — Encounter: Payer: Self-pay | Admitting: Gastroenterology

## 2017-08-19 ENCOUNTER — Telehealth: Payer: Self-pay | Admitting: Family Medicine

## 2017-08-19 ENCOUNTER — Ambulatory Visit: Payer: Medicare Other | Admitting: Family Medicine

## 2017-08-19 DIAGNOSIS — Z0289 Encounter for other administrative examinations: Secondary | ICD-10-CM

## 2017-08-19 NOTE — Telephone Encounter (Signed)
10/8 CPE canceled [place] called out. Please contact pt to fine another CPE appt. Booked until 2019.

## 2017-08-30 ENCOUNTER — Ambulatory Visit (INDEPENDENT_AMBULATORY_CARE_PROVIDER_SITE_OTHER): Payer: Medicare Other | Admitting: Family Medicine

## 2017-08-30 ENCOUNTER — Encounter: Payer: Self-pay | Admitting: Family Medicine

## 2017-08-30 VITALS — BP 128/68 | HR 62 | Temp 98.4°F | Resp 18 | Wt 201.0 lb

## 2017-08-30 DIAGNOSIS — E2839 Other primary ovarian failure: Secondary | ICD-10-CM | POA: Diagnosis not present

## 2017-08-30 DIAGNOSIS — M5442 Lumbago with sciatica, left side: Secondary | ICD-10-CM

## 2017-08-30 DIAGNOSIS — G8929 Other chronic pain: Secondary | ICD-10-CM

## 2017-08-30 DIAGNOSIS — I10 Essential (primary) hypertension: Secondary | ICD-10-CM

## 2017-08-30 DIAGNOSIS — F3181 Bipolar II disorder: Secondary | ICD-10-CM

## 2017-08-30 DIAGNOSIS — R1012 Left upper quadrant pain: Secondary | ICD-10-CM | POA: Diagnosis not present

## 2017-08-30 DIAGNOSIS — Z23 Encounter for immunization: Secondary | ICD-10-CM | POA: Diagnosis not present

## 2017-08-30 DIAGNOSIS — B354 Tinea corporis: Secondary | ICD-10-CM

## 2017-08-30 LAB — LIPID PANEL
Cholesterol: 193 mg/dL (ref <200)
HDL: 76 mg/dL (ref >50)
LDL CHOLESTEROL (CALC): 95 mg/dL
NON-HDL CHOLESTEROL (CALC): 117 mg/dL (ref <130)
TRIGLYCERIDES: 123 mg/dL (ref <150)
Total CHOL/HDL Ratio: 2.5 (calc) (ref <5.0)

## 2017-08-30 LAB — COMPREHENSIVE METABOLIC PANEL
AG Ratio: 1.6 (calc) (ref 1.0–2.5)
ALT: 16 U/L (ref 6–29)
AST: 20 U/L (ref 10–35)
Albumin: 4.4 g/dL (ref 3.6–5.1)
Alkaline phosphatase (APISO): 70 U/L (ref 33–130)
BUN: 9 mg/dL (ref 7–25)
CO2: 29 mmol/L (ref 20–32)
CREATININE: 0.76 mg/dL (ref 0.60–0.93)
Calcium: 9.5 mg/dL (ref 8.6–10.4)
Chloride: 105 mmol/L (ref 98–110)
GLOBULIN: 2.7 g/dL (ref 1.9–3.7)
GLUCOSE: 112 mg/dL — AB (ref 65–99)
Potassium: 4.6 mmol/L (ref 3.5–5.3)
Sodium: 142 mmol/L (ref 135–146)
Total Bilirubin: 0.4 mg/dL (ref 0.2–1.2)
Total Protein: 7.1 g/dL (ref 6.1–8.1)

## 2017-08-30 LAB — CBC WITH DIFFERENTIAL/PLATELET
BASOS ABS: 77 {cells}/uL (ref 0–200)
Basophils Relative: 1 %
EOS ABS: 270 {cells}/uL (ref 15–500)
Eosinophils Relative: 3.5 %
HEMATOCRIT: 45.4 % — AB (ref 35.0–45.0)
Hemoglobin: 15.1 g/dL (ref 11.7–15.5)
LYMPHS ABS: 2356 {cells}/uL (ref 850–3900)
MCH: 30.3 pg (ref 27.0–33.0)
MCHC: 33.3 g/dL (ref 32.0–36.0)
MCV: 91 fL (ref 80.0–100.0)
MPV: 10.2 fL (ref 7.5–12.5)
Monocytes Relative: 11.2 %
NEUTROS PCT: 53.7 %
Neutro Abs: 4135 cells/uL (ref 1500–7800)
Platelets: 393 10*3/uL (ref 140–400)
RBC: 4.99 10*6/uL (ref 3.80–5.10)
RDW: 12.1 % (ref 11.0–15.0)
Total Lymphocyte: 30.6 %
WBC: 7.7 10*3/uL (ref 3.8–10.8)
WBCMIX: 862 {cells}/uL (ref 200–950)

## 2017-08-30 MED ORDER — NYSTATIN 100000 UNIT/GM EX CREA: 1.0000 "application " | TOPICAL_CREAM | Freq: Two times a day (BID) | CUTANEOUS | 0 refills | Status: DC

## 2017-08-30 NOTE — Progress Notes (Signed)
Patient ID: Megan Duncan, female    DOB: 24-Sep-1946  Age: 71 y.o. MRN: [id number]    Subjective:  Subjective  HPI Megan Duncan presents for f/u back pain that is worsening --- she is requesting to see someone  She c/o rash under her breasts She c/o LUQ pain-- no NVD, no constipation.    Review of Systems  Constitutional: Negative for appetite change, diaphoresis, fatigue and unexpected weight change.  Eyes: Negative for pain, redness and visual disturbance.  Respiratory: Negative for cough, chest tightness, shortness of breath and wheezing.   Cardiovascular: Negative for chest pain, palpitations and leg swelling.  Gastrointestinal: Positive for abdominal pain. Negative for anal bleeding, blood in stool, constipation, diarrhea, nausea, rectal pain and vomiting.  Endocrine: Negative for cold intolerance, heat intolerance, polydipsia, polyphagia and polyuria.  Genitourinary: Negative for difficulty urinating, dysuria and frequency.  Musculoskeletal: Positive for back pain. Negative for arthralgias, gait problem, joint swelling, myalgias, neck pain and neck stiffness.  Skin: Positive for rash.  Neurological: Negative for dizziness, light-headedness, numbness and headaches.    History Past Medical History:  Diagnosis Date  . Anxiety   . Arthritis    hands  . Bipolar II disorder ([place])    Dr. Charlott Holler  . COPD (chronic obstructive pulmonary disease) ([place])   . Depression   . Diastolic dysfunction    Per pt, diagnosed after [facility]  . Diverticulosis   . Fibromyalgia   . Hyperlipidemia   . Hypertension   . LBP (low back pain)     She has a past surgical history that includes Appendectomy; Total abdominal hysterectomy; Tonsillectomy; and Knee arthroscopy (Right, 2008).   Her family history includes Alcoholism in her father; Breast cancer in her maternal aunt; Colon cancer (age of onset: 39) in her maternal aunt; Diabetes in her father; Heart attack in her father; Hiatal hernia  in her mother; Hypertension in her father and mother; Mental illness in her sister.She reports that she quit smoking about 13 years ago. She has a 60.00 pack-year smoking history. She has never used smokeless tobacco. She reports that she drinks alcohol. She reports that she does not use drugs.  Current Outpatient Prescriptions on File Prior to Visit  Medication Sig Dispense Refill  . albuterol (VENTOLIN HFA) 108 (90 Base) MCG/ACT inhaler Inhale 2 puffs into the lungs every 4 (four) hours as needed. For shortness of breath and wheezing 18 g 3  . amLODipine (NORVASC) 5 MG tablet TAKE 1 TABLET EVERY DAY  (REPLACES LISINOPRIL/HCTZ) 90 tablet 1  . benzonatate (TESSALON) 100 MG capsule Take 1 capsule (100 mg total) by mouth 3 (three) times daily as needed. 21 capsule 0  . clorazepate (TRANXENE) 3.75 MG tablet Take 1 tablet (3.75 mg total) by mouth 2 (two) times daily as needed for anxiety. 90 tablet 1  . cyclobenzaprine (FLEXERIL) 10 MG tablet Take 1 tablet (10 mg total) by mouth 3 (three) times daily as needed. For muscle spasms 30 tablet 0  . DULoxetine (CYMBALTA) 20 MG capsule TAKE 2 CAPSULE BY MOUTH EVERY DAY 60 capsule 0  . hydrocortisone-pramoxine (ANALPRAM HC) 2.5-1 % rectal cream Place 1 application rectally 3 (three) times daily. (Patient taking differently: Place 1 application rectally as needed. ) 30 g 3  . lithium carbonate 300 MG capsule TK 2 CS HS  1  . propranolol (INDERAL) 10 MG tablet Take 10 mg by mouth 2 (two) times daily.    . Pyridoxine HCl (VITAMIN B-6 PO) Take 1 tablet by  mouth daily.    . ranitidine (ZANTAC) 150 MG tablet Take 150 mg by mouth as needed.      No current facility-administered medications on file prior to visit.      Objective:  Objective  Physical Exam  Constitutional: She is oriented to person, place, and time. She appears well-developed and well-nourished.  HENT:  Head: Normocephalic and atraumatic.  Eyes: Conjunctivae and EOM are normal.  Neck: Normal  range of motion. Neck supple. No JVD present. Carotid bruit is not present. No thyromegaly present.  Cardiovascular: Normal rate, regular rhythm and normal heart sounds.   No murmur heard. Pulmonary/Chest: Effort normal and breath sounds normal. No respiratory distress. She has no wheezes. She has no rales. She exhibits no tenderness.  Abdominal: There is tenderness in the left upper quadrant.    Musculoskeletal: She exhibits no edema.  Neurological: She is alert and oriented to person, place, and time.  Skin: Rash noted. Rash is macular. There is erythema.     Psychiatric: She has a normal mood and affect.  Nursing note and vitals reviewed.  BP 128/68 (BP Location: Left Arm, Patient Position: Sitting, Cuff Size: Normal)   Pulse 62   Temp 98.4 F (36.9 C) (Oral)   Resp 18   Wt 201 lb (91.2 kg)   SpO2 96%   BMI 36.18 kg/m  Wt Readings from Last 3 Encounters:  08/30/17 201 lb (91.2 kg)  06/03/17 199 lb 6.4 oz (90.4 kg)  05/23/17 198 lb 3.2 oz (89.9 kg)     Lab Results  Component Value Date   WBC 7.7 08/30/2017   HGB 15.1 08/30/2017   HCT 45.4 (H) 08/30/2017   PLT 393 08/30/2017   GLUCOSE 112 (H) 08/30/2017   CHOL 193 08/30/2017   TRIG 123 08/30/2017   HDL 76 08/30/2017   LDLDIRECT 120.1 12/03/2013   LDLCALC 119 (H) 02/12/2017   ALT 16 08/30/2017   AST 20 08/30/2017   NA 142 08/30/2017   K 4.6 08/30/2017   CL 105 08/30/2017   CREATININE 0.76 08/30/2017   BUN 9 08/30/2017   CO2 29 08/30/2017   TSH 2.39 08/01/2015   INR 1.0 10/25/2015   HGBA1C 5.7 02/12/2017    Dg Chest 2 View  Result Date: 05/13/2017 CLINICAL DATA:  Chest pressure, cough, congestion, and COPD flare type symptoms. Former smoker. EXAM: CHEST  2 VIEW COMPARISON:  PA and lateral chest x-ray of July 20, 2016 FINDINGS: The lungs are well-expanded. The interstitial markings are coarse. There is no alveolar infiltrate. There is no pleural effusion. The heart and pulmonary vascularity are normal. The  bony thorax exhibits no acute abnormality. There is calcification in the wall of the aortic arch. IMPRESSION: Chronic bronchitic changes, stable. No pneumonia, CHF, nor other acute cardiopulmonary abnormality. Thoracic aortic atherosclerosis. Electronically Signed   By: David  Martinique M.D.   On: 05/13/2017 15:25     Assessment & Plan:  Plan  I have discontinued Megan Duncan's AEROCHAMBER MV and budesonide-formoterol. I am also having her start on nystatin cream. Additionally, I am having her maintain her cyclobenzaprine, clorazepate, hydrocortisone-pramoxine, ranitidine, DULoxetine, lithium carbonate, propranolol, benzonatate, albuterol, Pyridoxine HCl (VITAMIN B-6 PO), and amLODipine.  Meds ordered this encounter  Medications  . nystatin cream (MYCOSTATIN)    Sig: Apply 1 application topically 2 (two) times daily.    Dispense:  30 g    Refill:  0    Problem List Items Addressed This Visit      Unprioritized  Bipolar 2 disorder ([place])    Per psych      Relevant Orders   Lithium level (Completed)   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-sided sciatica    Refer to ortho for further evaluation       Relevant Orders   Ambulatory referral to Orthopedic Surgery   Essential hypertension - Primary    Well controlled, no changes to meds. Encouraged heart healthy diet such as the DASH diet and exercise as tolerated.       Relevant Orders   CBC w/Diff (Completed)   Lipid panel (Completed)   Comp Met (CMET) (Completed)   Estrogen deficiency   Relevant Orders   DG Bone Density   Left upper quadrant pain    New problem Check [name] abd      Relevant Orders   US Abdomen Complete   Tinea corporis    Use nystatin cream        Relevant Medications   nystatin cream (MYCOSTATIN)    Other Visit Diagnoses    Needs flu shot       Relevant Orders   Flu vaccine HIGH DOSE PF (Fluzone High dose) (Completed)      Follow-up: Return in about 6 months (around 02/28/2018), or if symptoms  worsen or fail to improve.  Ann Held, DO

## 2017-08-30 NOTE — Patient Instructions (Signed)

## 2017-08-31 LAB — LITHIUM LEVEL: Lithium Lvl: 0.4 mmol/L — ABNORMAL LOW (ref 0.6–1.2)

## 2017-09-01 DIAGNOSIS — B354 Tinea corporis: Secondary | ICD-10-CM

## 2017-09-01 DIAGNOSIS — E2839 Other primary ovarian failure: Secondary | ICD-10-CM | POA: Insufficient documentation

## 2017-09-01 DIAGNOSIS — R1012 Left upper quadrant pain: Secondary | ICD-10-CM | POA: Insufficient documentation

## 2017-09-01 HISTORY — DX: Left upper quadrant pain: R10.12

## 2017-09-01 HISTORY — DX: Tinea corporis: B35.4

## 2017-09-01 HISTORY — DX: Other primary ovarian failure: E28.39

## 2017-09-01 NOTE — Assessment & Plan Note (Signed)
New problem Check [name] abd

## 2017-09-01 NOTE — Assessment & Plan Note (Signed)
Well controlled, no changes to meds. Encouraged heart healthy diet such as the DASH diet and exercise as tolerated.  °

## 2017-09-01 NOTE — Assessment & Plan Note (Signed)
Refer to ortho for further evaluation

## 2017-09-01 NOTE — Assessment & Plan Note (Signed)
Per psych 

## 2017-09-01 NOTE — Assessment & Plan Note (Signed)
Use nystatin cream

## 2017-09-03 ENCOUNTER — Ambulatory Visit (INDEPENDENT_AMBULATORY_CARE_PROVIDER_SITE_OTHER): Payer: Medicare Other | Admitting: Emergency Medicine

## 2017-09-03 ENCOUNTER — Encounter: Payer: Self-pay | Admitting: Emergency Medicine

## 2017-09-03 DIAGNOSIS — J301 Allergic rhinitis due to pollen: Secondary | ICD-10-CM

## 2017-09-03 DIAGNOSIS — J449 Chronic obstructive pulmonary disease, unspecified: Secondary | ICD-10-CM | POA: Diagnosis not present

## 2017-09-03 NOTE — Patient Instructions (Signed)
Please start Allegra 180 mg once a day If your nasal congestion is not fully controlled on the Allegra you can consider starting fluticasone nasal spray (Flonase) 2 sprays each nostril once a day Keep albuterol available to use 2 puffs if needed for shortness of breath, wheezing Flu shot up-to-date Follow with Dr Lamonte Sakai in 1 year or sooner if you have any problems

## 2017-09-03 NOTE — Progress Notes (Signed)
Subjective:    Patient ID: Megan Duncan, female    DOB: 1945-11-25, 71 y.o.   MRN: [id number]  HPI 71 yo former smoker (60 pack-yrs) with hx HTN, diastolic dysfxn, obesity. Has hx transaminitis that was ascribed to meds. She has been experiencing dyspnea on exertion. Has been going on for several years, slowly progressive. Now notes that she cannot do stairs without SOB. Happens also with speaking a lot. Occasionally has felt tightness in her chest. She describes low energy, knows that she needs to increase her exercise, lose weight. She was started on Breo, Symbicort for a trial a week, not sure it helped her. She does use albuterol prn, seems to have helped her although she uses rarely. She has some dry cough, less since stopping her ACE-I. She does have chronic nasal gtt to her throat.   ROV 10/30/16 -- This follow-up visit for multifactorial dyspnea in the setting of obesity, deconditioning and probable COPD. She also has a history of nasal drip and upper airway irritation with associated cough. At her last visit we initiated Spiriva Respimat to see if she would benefit. We also started loratadine 10 mg daily. Pulmonary function testing was performed on 10/11/16 that I personally reviewed. These show moderately severe obstruction with a positive bronchodilator response, normal lung volumes consistent with probable superimposed restriction, and a decreased diffusion capacity that corrects to the normal range when adjusted for alveolar volume. She believes that she can tell an improvement in her breathing since addition of spiriva. Her cough has been better, but still with nasal gtt.   ROV 06/03/17 -- Patient has a history of multifactorial dyspnea with moderately severe obstruction and a positive bronchodilator response on spirometry. She also has chronic upper airway irritation with nasal drip and associated chronic cough. She has been on Stiolto since March - felt that she might be having side  effects from it. She was treated the beginning of July for a suspected bronchitis/acute exacerbation. At follow-up visit here on 7/12 her Stiolto was changed to Symbicort. She is also currently on zantac,  . 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 Arrest panel was performed that showed no elevated IgE, sensitivities to cockroaches, cat dander and others. She is no longer on allergy regimen.   ROV 09/03/17 --this is a follow-up visit for obstructive lung disease with an asthma pattern, chronic upper airway irritation with associated cough.  She has allergic rhinitis (positive allergy sensitivity to cockroaches, cat dander, others). Her cough is better although she still has a lot of nasal gtt. I stopped Symbicort last time to see if this helped cough. She isn't sure that she misses it. Uses albuterol occasionally - seems to benefit. She is currently off loratadine.    Review of Systems  Constitutional: Negative for fever and unexpected weight change.  HENT: Negative for congestion, dental problem, ear pain, nosebleeds, postnasal drip, rhinorrhea, sinus pressure, sneezing, sore throat and trouble swallowing.   Eyes: Negative for redness and itching.  Respiratory: Positive for cough and shortness of breath. Negative for chest tightness and wheezing.   Cardiovascular: Positive for palpitations. Negative for leg swelling.  Gastrointestinal: Negative for nausea and vomiting.  Genitourinary: Negative for dysuria.  Musculoskeletal: Negative for joint swelling.  Skin: Negative for rash.  Neurological: Negative for headaches.  Hematological: Does not bruise/bleed easily.  Psychiatric/Behavioral: Positive for dysphoric mood. The patient is nervous/anxious.     Past Medical History:  Diagnosis Date  . Anxiety   . Arthritis  hands  . Bipolar II disorder ([place])    Dr. Charlott Holler  . COPD (chronic obstructive pulmonary disease) ([place])   . Depression   . Diastolic dysfunction    Per pt, diagnosed after [facility]    . Diverticulosis   . Fibromyalgia   . Hyperlipidemia   . Hypertension   . LBP (low back pain)      Family History  Problem Relation Age of Onset  . Colon cancer Maternal Aunt 80  . Diabetes Father   . Hypertension Father   . Heart attack Father   . Alcoholism Father   . Hypertension Mother   . Hiatal hernia Mother   . Mental illness Sister        suicide  . Breast cancer Maternal Aunt      Social History   Social History  . Marital status: Married    Spouse name: N/A  . Number of children: 3  . Years of education: N/A   Occupational History  . retired    Social History Main Topics  . Smoking status: Former Smoker    Packs/day: 1.50    Years: 40.00    Quit date: 07/02/2004  . Smokeless tobacco: Never Used  . Alcohol use Yes     Comment: occasional wine  . Drug use: No  . Sexual activity: No   Other Topics Concern  . Not on file   Social History Narrative   Lives with husband and grandson she is raising.     Allergies  Allergen Reactions  . Atorvastatin Other (See Comments)    Significant rise in liver tests  . Codeine Nausea And Vomiting  . Diclofenac Other (See Comments)    Elevated LFT's  . Doxycycline     Extreme nausea  . Sulfonamide Derivatives Swelling     Outpatient Medications Prior to Visit  Medication Sig Dispense Refill  . albuterol (VENTOLIN HFA) 108 (90 Base) MCG/ACT inhaler Inhale 2 puffs into the lungs every 4 (four) hours as needed. For shortness of breath and wheezing 18 g 3  . amLODipine (NORVASC) 5 MG tablet TAKE 1 TABLET EVERY DAY  (REPLACES LISINOPRIL/HCTZ) 90 tablet 1  . benzonatate (TESSALON) 100 MG capsule Take 1 capsule (100 mg total) by mouth 3 (three) times daily as needed. 21 capsule 0  . clorazepate (TRANXENE) 3.75 MG tablet Take 1 tablet (3.75 mg total) by mouth 2 (two) times daily as needed for anxiety. 90 tablet 1  . cyclobenzaprine (FLEXERIL) 10 MG tablet Take 1 tablet (10 mg total) by mouth 3 (three) times daily as  needed. For muscle spasms 30 tablet 0  . DULoxetine (CYMBALTA) 20 MG capsule TAKE 2 CAPSULE BY MOUTH EVERY DAY 60 capsule 0  . hydrocortisone-pramoxine (ANALPRAM HC) 2.5-1 % rectal cream Place 1 application rectally 3 (three) times daily. (Patient taking differently: Place 1 application rectally as needed. ) 30 g 3  . lithium carbonate 300 MG capsule TK 2 CS HS  1  . nystatin cream (MYCOSTATIN) Apply 1 application topically 2 (two) times daily. 30 g 0  . propranolol (INDERAL) 10 MG tablet Take 10 mg by mouth 2 (two) times daily.    . Pyridoxine HCl (VITAMIN B-6 PO) Take 1 tablet by mouth daily.    . ranitidine (ZANTAC) 150 MG tablet Take 150 mg by mouth as needed.      No facility-administered medications prior to visit.         Objective:   Physical Exam Vitals:   09/03/17  1038  BP: 128/80  Pulse: 61  SpO2: 94%  Weight: 203 lb 4 oz (92.2 kg)  Height: 5\' 2"  (1.575 m)   Gen: Pleasant, overwt woman, in no distress,  normal affect  ENT: No lesions,  mouth clear,  oropharynx clear, no postnasal drip  Neck: No JVD, no TMG, no carotid bruits  Lungs: No use of accessory muscles, clear without rales or rhonchi  Cardiovascular: RRR, heart sounds normal, no murmur or gallops, no peripheral edema  Musculoskeletal: No deformities, no cyanosis or clubbing  Neuro: alert, non focal  Skin: Warm, no lesions or rashes   TTE 08/02/16 -  Study Conclusions  - Left ventricle: The cavity size was normal. Wall thickness was   normal. Systolic function was normal. The estimated ejection   fraction was in the range of 55% to 60%. Doppler parameters are   consistent with abnormal left ventricular relaxation (grade 1   diastolic dysfunction). - normal RV size and fxn - normal RA      Assessment & Plan:  COPD (chronic obstructive pulmonary disease) ([place]) With asthma component.  She appears to be stable at this time off bronchodilators despite clear obstruction on her pulmonary function  testing.  She does use albuterol occasionally.  I will leave her off of scheduled medications for now, consider restarting if her albuterol use or symptoms increase.  Flu shot is up-to-date.  Attempt to control allergies to minimize risk of flaring  Keep albuterol available to use 2 puffs if needed for shortness of breath, wheezing Flu shot up-to-date Follow with Dr Lamonte Sakai in 1 year or sooner if you have any problems  Allergic rhinitis She has persistent symptoms.  Currently untreated.  We discussed an alternative to loratadine as she did not feel that she benefited from it very much.  Please start Allegra 180 mg once a day If your nasal congestion is not fully controlled on the Allegra you can consider starting fluticasone nasal spray (Flonase) 2 sprays each nostril once a day   Baltazar Apo, MD, PhD 09/03/2017, 11:09 AM [facility] [phone] or if no answer [phone]

## 2017-09-03 NOTE — Assessment & Plan Note (Signed)
With asthma component.  She appears to be stable at this time off bronchodilators despite clear obstruction on her pulmonary function testing.  She does use albuterol occasionally.  I will leave her off of scheduled medications for now, consider restarting if her albuterol use or symptoms increase.  Flu shot is up-to-date.  Attempt to control allergies to minimize risk of flaring  Keep albuterol available to use 2 puffs if needed for shortness of breath, wheezing Flu shot up-to-date Follow with Dr Lamonte Sakai in 1 year or sooner if you have any problems

## 2017-09-03 NOTE — Assessment & Plan Note (Signed)
She has persistent symptoms.  Currently untreated.  We discussed an alternative to loratadine as she did not feel that she benefited from it very much.  Please start Allegra 180 mg once a day If your nasal congestion is not fully controlled on the Allegra you can consider starting fluticasone nasal spray (Flonase) 2 sprays each nostril once a day

## 2017-09-05 NOTE — Telephone Encounter (Signed)
Ok to schedule with another provider? Please advise

## 2017-09-05 NOTE — Telephone Encounter (Signed)
Please call and schedule pt

## 2017-09-05 NOTE — Telephone Encounter (Signed)
Can use hosp f/u as long as its a physical and not medicare wellness--- med wellness with RN

## 2017-09-06 NOTE — Telephone Encounter (Signed)
Patient was seen by PCP on 08/30/2017 for physical. Patient received flu shot on 08/30/17

## 2017-09-11 ENCOUNTER — Ambulatory Visit (HOSPITAL_BASED_OUTPATIENT_CLINIC_OR_DEPARTMENT_OTHER)
Admission: RE | Admit: 2017-09-11 | Discharge: 2017-09-11 | Disposition: A | Discharge status: Home / Self Care | Payer: Medicare Other | Source: Ambulatory Visit | Attending: Family Medicine | Admitting: Family Medicine

## 2017-09-11 DIAGNOSIS — E2839 Other primary ovarian failure: Secondary | ICD-10-CM

## 2017-09-11 DIAGNOSIS — N281 Cyst of kidney, acquired: Secondary | ICD-10-CM | POA: Insufficient documentation

## 2017-09-11 DIAGNOSIS — R932 Abnormal findings on diagnostic imaging of liver and biliary tract: Secondary | ICD-10-CM | POA: Diagnosis not present

## 2017-09-11 DIAGNOSIS — R1012 Left upper quadrant pain: Secondary | ICD-10-CM | POA: Diagnosis not present

## 2017-09-11 DIAGNOSIS — Z13820 Encounter for screening for osteoporosis: Secondary | ICD-10-CM | POA: Diagnosis not present

## 2017-09-11 DIAGNOSIS — Z780 Asymptomatic menopausal state: Secondary | ICD-10-CM | POA: Diagnosis not present

## 2017-09-17 ENCOUNTER — Other Ambulatory Visit: Payer: Self-pay | Admitting: Orthopedic Surgery

## 2017-09-17 DIAGNOSIS — M545 Low back pain: Secondary | ICD-10-CM

## 2017-09-17 DIAGNOSIS — M48061 Spinal stenosis, lumbar region without neurogenic claudication: Secondary | ICD-10-CM | POA: Diagnosis not present

## 2017-09-19 DIAGNOSIS — F3181 Bipolar II disorder: Secondary | ICD-10-CM | POA: Diagnosis not present

## 2017-09-23 ENCOUNTER — Telehealth: Payer: Self-pay | Admitting: Family Medicine

## 2017-09-23 ENCOUNTER — Ambulatory Visit: Payer: Medicare Other | Admitting: Family Medicine

## 2017-09-23 NOTE — Telephone Encounter (Signed)
Pt called to cancel today appt for skin tag removal due to woke up with severe flare up of back pain making it difficult to walk. Pt did reschedule to tomorrow 09/24/17.

## 2017-09-24 ENCOUNTER — Encounter: Payer: Self-pay | Admitting: Family Medicine

## 2017-09-24 ENCOUNTER — Ambulatory Visit (INDEPENDENT_AMBULATORY_CARE_PROVIDER_SITE_OTHER): Payer: Medicare Other | Admitting: Family Medicine

## 2017-09-24 VITALS — BP 136/66 | HR 59 | Temp 98.3°F | Resp 16 | Ht 62.0 in | Wt 205.0 lb

## 2017-09-24 DIAGNOSIS — L918 Other hypertrophic disorders of the skin: Secondary | ICD-10-CM | POA: Diagnosis not present

## 2017-09-24 DIAGNOSIS — L821 Other seborrheic keratosis: Secondary | ICD-10-CM | POA: Diagnosis not present

## 2017-09-24 NOTE — Patient Instructions (Signed)
Skin Tag, Adult A skin tag (acrochordon) is a soft, extra growth of skin. Most skin tags are flesh-colored and rarely bigger than a pencil eraser. They commonly form near areas where there are folds in the skin, such as the armpit or groin. Skin tags are not dangerous, and they do not spread from person to person (are not contagious). You may have one skin tag or several. Skin tags do not require treatment. However, your health care provider may recommend removal of a skin tag if it:  Gets irritated from clothing.  Bleeds.  Is visible and unsightly.  Your health care provider can remove skin tags with a simple surgical procedure or a procedure that involves freezing the skin tag. Follow these instructions at home:  Watch for any changes in your skin tag. A normal skin tag does not require any other special care at home.  Take over-the-counter and prescription medicines only as told by your health care provider.  Keep all follow-up visits as told by your health care provider. This is important. Contact a health care provider if:  You have a skin tag that: ? Becomes painful. ? Changes color. ? Bleeds. ? Swells.  You develop more skin tags. This information is not intended to replace advice given to you by your health care provider. Make sure you discuss any questions you have with your health care provider. Document Released: 11/13/2015 Document Revised: 06/24/2016 Document Reviewed: 11/13/2015 Elsevier Interactive Patient Education  2018 [employer]  

## 2017-09-24 NOTE — Progress Notes (Signed)
Patient ID: Megan Duncan, female    DOB: 08-11-1946  Age: 71 y.o. MRN: [id number]    Subjective:  Subjective  HPI Megan Duncan presents for skin tag removal   Review of Systems  Constitutional: Negative for appetite change, diaphoresis, fatigue and unexpected weight change.  Eyes: Negative for pain, redness and visual disturbance.  Respiratory: Negative for cough, chest tightness, shortness of breath and wheezing.   Cardiovascular: Negative for chest pain, palpitations and leg swelling.  Endocrine: Negative for cold intolerance, heat intolerance, polydipsia, polyphagia and polyuria.  Genitourinary: Negative for difficulty urinating, dysuria and frequency.  Neurological: Negative for dizziness, light-headedness, numbness and headaches.    History Past Medical History:  Diagnosis Date  . Anxiety   . Arthritis    hands  . Bipolar II disorder (Megan Duncan)    Dr. Charlott Holler  . COPD (chronic obstructive pulmonary disease) ([place])   . Depression   . Diastolic dysfunction    Per pt, diagnosed after [facility]  . Diverticulosis   . Fibromyalgia   . Hyperlipidemia   . Hypertension   . LBP (low back pain)     She has a past surgical history that includes Appendectomy; Total abdominal hysterectomy; Tonsillectomy; and Knee arthroscopy (Right, 2008).   Her family history includes Alcoholism in her father; Breast cancer in her maternal aunt; Colon cancer (age of onset: 25) in her maternal aunt; Diabetes in her father; Heart attack in her father; Hiatal hernia in her mother; Hypertension in her father and mother; Mental illness in her sister.She reports that she quit smoking about 13 years ago. She has a 60.00 pack-year smoking history. she has never used smokeless tobacco. She reports that she drinks alcohol. She reports that she does not use drugs.  Current Outpatient Medications on File Prior to Visit  Medication Sig Dispense Refill  . albuterol (VENTOLIN HFA) 108 (90 Base) MCG/ACT inhaler  Inhale 2 puffs into the lungs every 4 (four) hours as needed. For shortness of breath and wheezing 18 g 3  . amLODipine (NORVASC) 5 MG tablet TAKE 1 TABLET EVERY DAY  (REPLACES LISINOPRIL/HCTZ) 90 tablet 1  . benzonatate (TESSALON) 100 MG capsule Take 1 capsule (100 mg total) by mouth 3 (three) times daily as needed. 21 capsule 0  . clorazepate (TRANXENE) 3.75 MG tablet Take 1 tablet (3.75 mg total) by mouth 2 (two) times daily as needed for anxiety. 90 tablet 1  . cyclobenzaprine (FLEXERIL) 10 MG tablet Take 1 tablet (10 mg total) by mouth 3 (three) times daily as needed. For muscle spasms 30 tablet 0  . DULoxetine (CYMBALTA) 20 MG capsule TAKE 2 CAPSULE BY MOUTH EVERY DAY 60 capsule 0  . hydrocortisone-pramoxine (ANALPRAM HC) 2.5-1 % rectal cream Place 1 application rectally 3 (three) times daily. (Patient taking differently: Place 1 application rectally as needed. ) 30 g 3  . lithium carbonate 300 MG capsule TK 2 CS HS  1  . nystatin cream (MYCOSTATIN) Apply 1 application topically 2 (two) times daily. 30 g 0  . propranolol (INDERAL) 10 MG tablet Take 10 mg by mouth 2 (two) times daily.    . Pyridoxine HCl (VITAMIN B-6 PO) Take 1 tablet by mouth daily.    . ranitidine (ZANTAC) 150 MG tablet Take 150 mg by mouth as needed.      No current facility-administered medications on file prior to visit.      Objective:  Objective  Physical Exam  HENT:  Head:    Nursing note and vitals  reviewed. S: The patient complains of symptomatic skin tags on the L side of the neck and R axilla . These are irritated by clothing, jewelry and rubbing.  O: Patient appears well. Several benign skin tags are noted on the L side of the neck and R axilla  P: Skin tags are snipped off using Betadine for cleansing and sterile iris scissors. Local anesthesia was used. .  The tag on the L side of the neck had irregular color and was sent for path.   BP 136/66   Pulse (!) 59   Temp 98.3 F (36.8 C) (Oral)   Resp  16   Ht 5\' 2"  (1.575 m)   Wt 205 lb (93 kg)   SpO2 97%   BMI 37.49 kg/m  Wt Readings from Last 3 Encounters:  09/24/17 205 lb (93 kg)  09/03/17 203 lb 4 oz (92.2 kg)  08/30/17 201 lb (91.2 kg)     Lab Results  Component Value Date   WBC 7.7 08/30/2017   HGB 15.1 08/30/2017   HCT 45.4 (H) 08/30/2017   PLT 393 08/30/2017   GLUCOSE 112 (H) 08/30/2017   CHOL 193 08/30/2017   TRIG 123 08/30/2017   HDL 76 08/30/2017   LDLDIRECT 120.1 12/03/2013   LDLCALC 119 (H) 02/12/2017   ALT 16 08/30/2017   AST 20 08/30/2017   NA 142 08/30/2017   K 4.6 08/30/2017   CL 105 08/30/2017   CREATININE 0.76 08/30/2017   BUN 9 08/30/2017   CO2 29 08/30/2017   TSH 2.39 08/01/2015   INR 1.0 10/25/2015   HGBA1C 5.7 02/12/2017    US Abdomen Complete  Result Date: 09/11/2017 CLINICAL DATA:  Left upper quadrant pain centered over the left lower ribcage for the past 2 years. This is tender to touch or related to position changes. History of diabetes and hyperlipidemia. EXAM: ABDOMEN ULTRASOUND COMPLETE COMPARISON:  Abdominal ultrasound of October 18, 2015 FINDINGS: Gallbladder: The gallbladder is adequately distended. No stones or sludge are observed. There is no wall thickening, pericholecystic fluid, or positive sonographic Murphy's sign. Common bile duct: Diameter: 3.4 mm Liver: The hepatic echotexture is mildly increased. There is no focal mass nor ductal dilation. The surface contour of the liver appears smooth. Portal vein is patent on color Doppler imaging with normal direction of blood flow towards the liver. IVC: No abnormality visualized. Pancreas: There is limited visualization of the pancreatic head and tail. The pancreatic body is normal where visualized. Spleen: Size and appearance within normal limits. Right Kidney: Length: 10.9 cm. There is a upper pole cyst demonstrating partial septations. It measures 3.8 x 3.2 x 3.6 cm. There is no hydronephrosis. The renal cortical echotexture appears  lower than that of the adjacent liver. Left Kidney: Length: 9.8 cm. Echogenicity within normal limits. No mass or hydronephrosis visualized. Abdominal aorta: No aneurysm visualized. Other findings: Specific attention to the area over the left lower ribcage reveals no sonographic abnormality. IMPRESSION: Mildly increased hepatic echotexture most compatible with fatty infiltrative change. Upper pole right renal cyst with incomplete septations. This is not new and has remained stable in size. No sonographic abnormalities noted in the left upper quadrant in the region of the patient's discomfort. Electronically Signed   By: David  Martinique M.D.   On: 09/11/2017 11:04   Dg Bone Density  Result Date: 09/11/2017 EXAM: DUAL X-RAY ABSORPTIOMETRY (DXA) FOR BONE MINERAL DENSITY IMPRESSION: Referring Physician:  Rosalita Chessman CHASE PATIENT: Name: Megan, Duncan Patient ID: [id number] Birth  Date: Mar 02, 1946 Height: 61.5 in. Sex: Female Measured: 09/11/2017 Weight: 204.4 lbs. Indications: Advanced Age, Caucasian, Estrogen Deficiency, Family Hx of Osteoporosis, Hysterectomy, Oophorectomy ( Bilateral), Post Menopausal, Previous Tobacco User Fractures: Treatments: HRT, Multivitamin ASSESSMENT: The BMD measured at Femur Neck Right is 0.957 g/cm2 with a T-score of -0.6. This patient is considered normal according to [facility]) criteria. L-1 was excluded due to degenerative changes. Todays DEXA was compared to the patient's previous DEXA from 08/23/2015. Site Region Measured Date Measured Age WHO YA BMD Classification T-score AP Spine L2-L4 09/11/2017 71.3 years Normal 1.2 1.345 g/cm2 DualFemur Neck Right 09/11/2017 71[age]3 years Normal -0.6 0.957 g/cm2 World Health Organization [facility]) criteria for post-menopausal, Caucasian Women: Normal       T-score at or above -1 SD Osteopenia   T-score between -1 and -2.5 SD Osteoporosis T-score at or below -2.5 SD RECOMMENDATION: [place] recommends  that FDA-approved medical therapies be considered in postmenopausal women and men age 28 or older with a: 1. Hip or vertebral (clinical or morphometric) fracture. 2. T-score of < -2.5 at the spine or hip. 3. Ten-year fracture probability by FRAX of 3% or greater for hip fracture or 20% or greater for major osteoporotic fracture. All treatment decisions require clinical judgment and consideration of individual patient factors, including patient preferences, co-morbidities, previous drug use, risk factors not captured in the FRAX model (e.g. falls, vitamin D deficiency, increased bone turnover, interval significant decline in bone density) and possible under - or over-estimation of fracture risk by FRAX. All patients should ensure an adequate intake of dietary calcium (1200 mg/d) and vitamin D (800 IU daily) unless contraindicated. FOLLOW-UP: People with diagnosed cases of osteoporosis or at high risk for fracture should have regular bone mineral density tests. For patients eligible for Medicare, routine testing is allowed once every 2 years. The testing frequency can be increased to one year for patients who have rapidly progressing disease, those who are receiving or discontinuing medical therapy to restore bone mass, or have additional risk factors. I have reviewed this report and agree with the above findings. [facility] Electronically Signed   By: Rolm Baptise M.D.   On: 09/11/2017 10:40     Assessment & Plan:  Plan  I am having Megan Huff "[name]" maintain her cyclobenzaprine, clorazepate, hydrocortisone-pramoxine, ranitidine, DULoxetine, lithium carbonate, propranolol, benzonatate, albuterol, Pyridoxine HCl (VITAMIN B-6 PO), amLODipine, and nystatin cream.  No orders of the defined types were placed in this encounter.   Problem List Items Addressed This Visit    None    Visit Diagnoses    Skin tag    -  Primary   Relevant Orders   Dermatology pathology      Follow-up:  Return if symptoms worsen or fail to improve.  Ann Held, DO

## 2017-09-29 ENCOUNTER — Ambulatory Visit
Admission: RE | Admit: 2017-09-29 | Discharge: 2017-09-29 | Disposition: A | Discharge status: Home / Self Care | Payer: Medicare Other | Source: Ambulatory Visit | Attending: Orthopedic Surgery | Admitting: Orthopedic Surgery

## 2017-09-29 DIAGNOSIS — M545 Low back pain: Secondary | ICD-10-CM

## 2017-09-29 DIAGNOSIS — M5126 Other intervertebral disc displacement, lumbar region: Secondary | ICD-10-CM | POA: Diagnosis not present

## 2017-10-14 ENCOUNTER — Other Ambulatory Visit: Payer: Self-pay | Admitting: Family Medicine

## 2017-11-11 DIAGNOSIS — M47816 Spondylosis without myelopathy or radiculopathy, lumbar region: Secondary | ICD-10-CM | POA: Diagnosis not present

## 2017-11-11 DIAGNOSIS — M545 Low back pain: Secondary | ICD-10-CM | POA: Diagnosis not present

## 2017-11-11 DIAGNOSIS — M48061 Spinal stenosis, lumbar region without neurogenic claudication: Secondary | ICD-10-CM | POA: Diagnosis not present

## 2017-11-22 DIAGNOSIS — M47816 Spondylosis without myelopathy or radiculopathy, lumbar region: Secondary | ICD-10-CM | POA: Diagnosis not present

## 2017-11-22 DIAGNOSIS — M545 Low back pain: Secondary | ICD-10-CM | POA: Diagnosis not present

## 2017-11-22 DIAGNOSIS — M48061 Spinal stenosis, lumbar region without neurogenic claudication: Secondary | ICD-10-CM | POA: Diagnosis not present

## 2017-12-10 DIAGNOSIS — M48061 Spinal stenosis, lumbar region without neurogenic claudication: Secondary | ICD-10-CM | POA: Diagnosis not present

## 2017-12-10 DIAGNOSIS — M7062 Trochanteric bursitis, left hip: Secondary | ICD-10-CM | POA: Diagnosis not present

## 2017-12-10 DIAGNOSIS — M545 Low back pain: Secondary | ICD-10-CM | POA: Diagnosis not present

## 2017-12-10 DIAGNOSIS — M47816 Spondylosis without myelopathy or radiculopathy, lumbar region: Secondary | ICD-10-CM | POA: Diagnosis not present

## 2017-12-20 DIAGNOSIS — M7062 Trochanteric bursitis, left hip: Secondary | ICD-10-CM | POA: Diagnosis not present

## 2017-12-23 ENCOUNTER — Other Ambulatory Visit: Payer: Self-pay | Admitting: *Deleted

## 2017-12-23 ENCOUNTER — Telehealth: Payer: Self-pay | Admitting: *Deleted

## 2017-12-23 DIAGNOSIS — F3181 Bipolar II disorder: Secondary | ICD-10-CM | POA: Diagnosis not present

## 2017-12-23 NOTE — Telephone Encounter (Signed)
Patient notified

## 2017-12-23 NOTE — Telephone Encounter (Signed)
Copied from [place]. Topic: General - Other >> Dec 20, 2017  1:49 PM Yvette Rack wrote: Reason for CRM: patient calling to see if its ok for her to take Vimoro 500/20mg  her ortho Doc gave her a sample of this medicine and wanted to know if its ok to take

## 2017-12-23 NOTE — Telephone Encounter (Signed)
Not a good idea for long term

## 2017-12-30 DIAGNOSIS — M545 Low back pain: Secondary | ICD-10-CM | POA: Diagnosis not present

## 2017-12-30 DIAGNOSIS — M6281 Muscle weakness (generalized): Secondary | ICD-10-CM | POA: Diagnosis not present

## 2017-12-30 DIAGNOSIS — M48061 Spinal stenosis, lumbar region without neurogenic claudication: Secondary | ICD-10-CM | POA: Diagnosis not present

## 2018-01-03 DIAGNOSIS — M48061 Spinal stenosis, lumbar region without neurogenic claudication: Secondary | ICD-10-CM | POA: Diagnosis not present

## 2018-01-03 DIAGNOSIS — M545 Low back pain: Secondary | ICD-10-CM | POA: Diagnosis not present

## 2018-01-03 DIAGNOSIS — M6281 Muscle weakness (generalized): Secondary | ICD-10-CM | POA: Diagnosis not present

## 2018-01-06 DIAGNOSIS — M48061 Spinal stenosis, lumbar region without neurogenic claudication: Secondary | ICD-10-CM | POA: Diagnosis not present

## 2018-01-06 DIAGNOSIS — M6281 Muscle weakness (generalized): Secondary | ICD-10-CM | POA: Diagnosis not present

## 2018-01-06 DIAGNOSIS — M545 Low back pain: Secondary | ICD-10-CM | POA: Diagnosis not present

## 2018-01-08 DIAGNOSIS — M6281 Muscle weakness (generalized): Secondary | ICD-10-CM | POA: Diagnosis not present

## 2018-01-08 DIAGNOSIS — M545 Low back pain: Secondary | ICD-10-CM | POA: Diagnosis not present

## 2018-01-08 DIAGNOSIS — M48061 Spinal stenosis, lumbar region without neurogenic claudication: Secondary | ICD-10-CM | POA: Diagnosis not present

## 2018-01-13 DIAGNOSIS — M48061 Spinal stenosis, lumbar region without neurogenic claudication: Secondary | ICD-10-CM | POA: Diagnosis not present

## 2018-01-13 DIAGNOSIS — M545 Low back pain: Secondary | ICD-10-CM | POA: Diagnosis not present

## 2018-01-13 DIAGNOSIS — M6281 Muscle weakness (generalized): Secondary | ICD-10-CM | POA: Diagnosis not present

## 2018-01-15 DIAGNOSIS — M545 Low back pain: Secondary | ICD-10-CM | POA: Diagnosis not present

## 2018-01-15 DIAGNOSIS — M48061 Spinal stenosis, lumbar region without neurogenic claudication: Secondary | ICD-10-CM | POA: Diagnosis not present

## 2018-01-15 DIAGNOSIS — M6281 Muscle weakness (generalized): Secondary | ICD-10-CM | POA: Diagnosis not present

## 2018-01-20 DIAGNOSIS — M48061 Spinal stenosis, lumbar region without neurogenic claudication: Secondary | ICD-10-CM | POA: Diagnosis not present

## 2018-01-20 DIAGNOSIS — M545 Low back pain: Secondary | ICD-10-CM | POA: Diagnosis not present

## 2018-01-20 DIAGNOSIS — M6281 Muscle weakness (generalized): Secondary | ICD-10-CM | POA: Diagnosis not present

## 2018-01-24 DIAGNOSIS — M6281 Muscle weakness (generalized): Secondary | ICD-10-CM | POA: Diagnosis not present

## 2018-01-24 DIAGNOSIS — M545 Low back pain: Secondary | ICD-10-CM | POA: Diagnosis not present

## 2018-01-24 DIAGNOSIS — M48061 Spinal stenosis, lumbar region without neurogenic claudication: Secondary | ICD-10-CM | POA: Diagnosis not present

## 2018-01-31 DIAGNOSIS — M48061 Spinal stenosis, lumbar region without neurogenic claudication: Secondary | ICD-10-CM | POA: Diagnosis not present

## 2018-01-31 DIAGNOSIS — M545 Low back pain: Secondary | ICD-10-CM | POA: Diagnosis not present

## 2018-01-31 DIAGNOSIS — M6281 Muscle weakness (generalized): Secondary | ICD-10-CM | POA: Diagnosis not present

## 2018-02-05 DIAGNOSIS — M48061 Spinal stenosis, lumbar region without neurogenic claudication: Secondary | ICD-10-CM | POA: Diagnosis not present

## 2018-02-05 DIAGNOSIS — M6281 Muscle weakness (generalized): Secondary | ICD-10-CM | POA: Diagnosis not present

## 2018-02-05 DIAGNOSIS — M545 Low back pain: Secondary | ICD-10-CM | POA: Diagnosis not present

## 2018-02-07 DIAGNOSIS — M545 Low back pain: Secondary | ICD-10-CM | POA: Diagnosis not present

## 2018-02-07 DIAGNOSIS — M6281 Muscle weakness (generalized): Secondary | ICD-10-CM | POA: Diagnosis not present

## 2018-02-07 DIAGNOSIS — M48061 Spinal stenosis, lumbar region without neurogenic claudication: Secondary | ICD-10-CM | POA: Diagnosis not present

## 2018-02-12 DIAGNOSIS — M6281 Muscle weakness (generalized): Secondary | ICD-10-CM | POA: Diagnosis not present

## 2018-02-12 DIAGNOSIS — M545 Low back pain: Secondary | ICD-10-CM | POA: Diagnosis not present

## 2018-02-12 DIAGNOSIS — M48061 Spinal stenosis, lumbar region without neurogenic claudication: Secondary | ICD-10-CM | POA: Diagnosis not present

## 2018-02-17 DIAGNOSIS — M545 Low back pain: Secondary | ICD-10-CM | POA: Diagnosis not present

## 2018-02-17 DIAGNOSIS — M48061 Spinal stenosis, lumbar region without neurogenic claudication: Secondary | ICD-10-CM | POA: Diagnosis not present

## 2018-02-17 DIAGNOSIS — M6281 Muscle weakness (generalized): Secondary | ICD-10-CM | POA: Diagnosis not present

## 2018-02-21 DIAGNOSIS — M6281 Muscle weakness (generalized): Secondary | ICD-10-CM | POA: Diagnosis not present

## 2018-02-21 DIAGNOSIS — M48061 Spinal stenosis, lumbar region without neurogenic claudication: Secondary | ICD-10-CM | POA: Diagnosis not present

## 2018-02-21 DIAGNOSIS — M545 Low back pain: Secondary | ICD-10-CM | POA: Diagnosis not present

## 2018-02-24 DIAGNOSIS — M48061 Spinal stenosis, lumbar region without neurogenic claudication: Secondary | ICD-10-CM | POA: Diagnosis not present

## 2018-02-24 DIAGNOSIS — M6281 Muscle weakness (generalized): Secondary | ICD-10-CM | POA: Diagnosis not present

## 2018-02-24 DIAGNOSIS — M545 Low back pain: Secondary | ICD-10-CM | POA: Diagnosis not present

## 2018-02-26 DIAGNOSIS — M6281 Muscle weakness (generalized): Secondary | ICD-10-CM | POA: Diagnosis not present

## 2018-02-26 DIAGNOSIS — M545 Low back pain: Secondary | ICD-10-CM | POA: Diagnosis not present

## 2018-02-26 DIAGNOSIS — M48061 Spinal stenosis, lumbar region without neurogenic claudication: Secondary | ICD-10-CM | POA: Diagnosis not present

## 2018-03-07 DIAGNOSIS — M6281 Muscle weakness (generalized): Secondary | ICD-10-CM | POA: Diagnosis not present

## 2018-03-07 DIAGNOSIS — M545 Low back pain: Secondary | ICD-10-CM | POA: Diagnosis not present

## 2018-03-07 DIAGNOSIS — M48061 Spinal stenosis, lumbar region without neurogenic claudication: Secondary | ICD-10-CM | POA: Diagnosis not present

## 2018-03-17 DIAGNOSIS — F3181 Bipolar II disorder: Secondary | ICD-10-CM | POA: Diagnosis not present

## 2018-03-21 ENCOUNTER — Telehealth: Payer: Self-pay | Admitting: *Deleted

## 2018-03-21 NOTE — Telephone Encounter (Signed)
Received Lab Report results from [facility]; forwarded to provider/SLS 05/10

## 2018-03-26 ENCOUNTER — Telehealth: Payer: Self-pay | Admitting: *Deleted

## 2018-03-26 NOTE — Telephone Encounter (Signed)
Received results from [facility].  Looks like sugar and tsh was elevated.  Per Dr. Etter Sjogren patient needs an ov.  Called patient advised that she needs an ov.  OV made for 5/23.

## 2018-04-03 ENCOUNTER — Encounter: Payer: Self-pay | Admitting: Family Medicine

## 2018-04-03 ENCOUNTER — Ambulatory Visit (INDEPENDENT_AMBULATORY_CARE_PROVIDER_SITE_OTHER): Payer: Medicare Other | Admitting: Family Medicine

## 2018-04-03 VITALS — BP 130/80 | HR 77 | Temp 98.1°F | Resp 16 | Ht 62.0 in | Wt 210.0 lb

## 2018-04-03 DIAGNOSIS — F3181 Bipolar II disorder: Secondary | ICD-10-CM

## 2018-04-03 DIAGNOSIS — R739 Hyperglycemia, unspecified: Secondary | ICD-10-CM

## 2018-04-03 DIAGNOSIS — R946 Abnormal results of thyroid function studies: Secondary | ICD-10-CM

## 2018-04-03 LAB — COMPREHENSIVE METABOLIC PANEL
ALBUMIN: 4.4 g/dL (ref 3.5–5.2)
ALK PHOS: 70 U/L (ref 39–117)
ALT: 16 U/L (ref 0–35)
AST: 19 U/L (ref 0–37)
BUN: 8 mg/dL (ref 6–23)
CALCIUM: 10.1 mg/dL (ref 8.4–10.5)
CO2: 31 mEq/L (ref 19–32)
CREATININE: 0.88 mg/dL (ref 0.40–1.20)
Chloride: 104 mEq/L (ref 96–112)
GFR: 67.15 mL/min (ref >60.00)
Glucose, Bld: 185 mg/dL — ABNORMAL HIGH (ref 70–99)
Potassium: 4.8 mEq/L (ref 3.5–5.1)
SODIUM: 142 meq/L (ref 135–145)
TOTAL PROTEIN: 7.1 g/dL (ref 6.0–8.3)
Total Bilirubin: 0.7 mg/dL (ref 0.2–1.2)

## 2018-04-03 LAB — THYROID PANEL WITH TSH
Free Thyroxine Index: 1.9 (ref 1.4–3.8)
T3 UPTAKE: 25 % (ref 22–35)
T4 TOTAL: 7.5 ug/dL (ref 5.1–11.9)
TSH: 2.99 mIU/L (ref 0.40–4.50)

## 2018-04-03 LAB — HEMOGLOBIN A1C: Hgb A1c MFr Bld: 6.3 % (ref 4.6–6.5)

## 2018-04-03 NOTE — Patient Instructions (Addendum)
Hyperglycemia Hyperglycemia occurs when the level of sugar (glucose) in the blood is too high. Glucose is a type of sugar that provides the body's main source of energy. Certain hormones (insulin and glucagon) control the level of glucose in the blood. Insulin lowers blood glucose, and glucagon increases blood glucose. Hyperglycemia can result from having too little insulin in the bloodstream, or from the body not responding normally to insulin. Hyperglycemia occurs most often in people who have diabetes (diabetes mellitus), but it can happen in people who do not have diabetes. It can develop quickly, and it can be life-threatening if it causes you to become severely dehydrated (diabetic ketoacidosis or hyperglycemic hyperosmolar state). Severe hyperglycemia is a medical emergency. What are the causes? If you have diabetes, hyperglycemia may be caused by:  Diabetes medicine.  Medicines that increase blood glucose or affect your diabetes control.  Not eating enough, or not eating often enough.  Changes in physical activity level.  Being sick or having an infection.  If you have prediabetes or undiagnosed diabetes:  Hyperglycemia may be caused by those conditions.  If you do not have diabetes, hyperglycemia may be caused by:  Certain medicines, including steroid medicines, beta-blockers, epinephrine, and thiazide diuretics.  Stress.  Serious illness.  Surgery.  Diseases of the pancreas.  Infection.  What increases the risk? Hyperglycemia is more likely to develop in people who have risk factors for diabetes, such as:  Having a family member with diabetes.  Having a gene for type 1 diabetes that is passed from parent to child (inherited).  Living in an area with cold weather conditions.  Exposure to certain viruses.  Certain conditions in which the body's disease-fighting (immune) system attacks itself (autoimmune disorders).  Being overweight or obese.  Having an  inactive (sedentary) lifestyle.  Having been diagnosed with insulin resistance.  Having a history of prediabetes, gestational diabetes, or polycystic ovarian syndrome (PCOS).  Being of American-Indian, African-American, Hispanic/Latino, or Asian/Pacific Islander descent.  What are the signs or symptoms? Hyperglycemia may not cause any symptoms. If you do have symptoms, they may include early warning signs, such as:  Increased thirst.  Hunger.  Feeling very tired.  Needing to urinate more often than usual.  Blurry vision.  Other symptoms may develop if hyperglycemia gets worse, such as:  Dry mouth.  Loss of appetite.  Fruity-smelling breath.  Weakness.  Unexpected or rapid weight gain or weight loss.  Tingling or numbness in the hands or feet.  Headache.  Skin that does not quickly return to normal after being lightly pinched and released (poor skin turgor).  Abdominal pain.  Cuts or bruises that are slow to heal.  How is this diagnosed? Hyperglycemia is diagnosed with a blood test to measure your blood glucose level. This blood test is usually done while you are having symptoms. Your health care provider may also do a physical exam and review your medical history. You may have more tests to determine the cause of your hyperglycemia, such as:  A fasting blood glucose (FBG) test. You will not be allowed to eat (you will fast) for at least 8 hours before a blood sample is taken.  An A1c (hemoglobin A1c) blood test. This provides information about blood glucose control over the previous 2-3 months.  An oral glucose tolerance test (OGTT). This measures your blood glucose at two times: ? After fasting. This is your baseline blood glucose level. ? Two hours after drinking a beverage that contains glucose.  How is   this treated? Treatment depends on the cause of your hyperglycemia. Treatment may include:  Taking medicine to regulate your blood glucose levels. If you  take insulin or other diabetes medicines, your medicine or dosage may be adjusted.  Lifestyle changes, such as exercising more, eating healthier foods, or losing weight.  Treating an illness or infection, if this caused your hyperglycemia.  Checking your blood glucose more often.  Stopping or reducing steroid medicines, if these caused your hyperglycemia.  If your hyperglycemia becomes severe and it results in hyperglycemic hyperosmolar state, you must be hospitalized and given IV fluids. Follow these instructions at home: General instructions  Take over-the-counter and prescription medicines only as told by your health care provider.  Do not use any products that contain nicotine or tobacco, such as cigarettes and e-cigarettes. If you need help quitting, ask your health care provider.  Limit alcohol intake to no more than 1 drink per day for nonpregnant women and 2 drinks per day for men. One drink equals 12 oz of beer, 5 oz of wine, or 1 oz of hard liquor.  Learn to manage stress. If you need help with this, ask your health care provider.  Keep all follow-up visits as told by your health care provider. This is important. Eating and drinking  Maintain a healthy weight.  Exercise regularly, as directed by your health care provider.  Stay hydrated, especially when you exercise, get sick, or spend time in hot temperatures.  Eat healthy foods, such as: ? Lean proteins. ? Complex carbohydrates. ? Fresh fruits and vegetables. ? Low-fat dairy products. ? Healthy fats.  Drink enough fluid to keep your urine clear or pale yellow. If you have diabetes:   Make sure you know the symptoms of hyperglycemia.  Follow your diabetes management plan, as told by your health care provider. Make sure you: ? Take your insulin and medicines as directed. ? Follow your exercise plan. ? Follow your meal plan. Eat on time, and do not skip meals. ? Check your blood glucose as often as directed.  Make sure to check your blood glucose before and after exercise. If you exercise longer or in a different way than usual, check your blood glucose more often. ? Follow your sick day plan whenever you cannot eat or drink normally. Make this plan in advance with your health care provider.  Share your diabetes management plan with people in your workplace, school, and household.  Check your urine for ketones when you are ill and as told by your health care provider.  Carry a medical alert card or wear medical alert jewelry. Contact a health care provider if:  Your blood glucose is at or above 240 mg/dL (13.3 mmol/L) for 2 days in a row.  You have problems keeping your blood glucose in your target range.  You have frequent episodes of hyperglycemia. Get help right away if:  You have difficulty breathing.  You have a change in how you think, feel, or act (mental status).  You have nausea or vomiting that does not go away. These symptoms may represent a serious problem that is an emergency. Do not wait to see if the symptoms will go away. Get medical help right away. Call your local emergency services (911 in the [place] Do not drive yourself to the hospital. Summary  Hyperglycemia occurs when the level of sugar (glucose) in the blood is too high.  Hyperglycemia is diagnosed with a blood test to measure your blood glucose level. This blood   test is usually done while you are having symptoms. Your health care provider may also do a physical exam and review your medical history.  If you have diabetes, follow your diabetes management plan as told by your health care provider.  Contact your health care provider if you have problems keeping your blood glucose in your target range. This information is not intended to replace advice given to you by your health care provider. Make sure you discuss any questions you have with your health care provider. Document Released: 04/24/2001 Document Revised:  07/16/2016 Document Reviewed: 07/16/2016 Elsevier Interactive Patient Education  2018 [place] for Gastroesophageal Reflux Disease, Adult When you have gastroesophageal reflux disease (GERD), the foods you eat and your eating habits are very important. Choosing the right foods can help ease your discomfort. What guidelines do I need to follow?  Choose fruits, vegetables, whole grains, and low-fat dairy products.  Choose low-fat meat, fish, and poultry.  Limit fats such as oils, salad dressings, butter, nuts, and avocado.  Keep a food diary. This helps you identify foods that cause symptoms.  Avoid foods that cause symptoms. These may be different for everyone.  Eat small meals often instead of 3 large meals a day.  Eat your meals slowly, in a place where you are relaxed.  Limit fried foods.  Cook foods using methods other than frying.  Avoid drinking alcohol.  Avoid drinking large amounts of liquids with your meals.  Avoid bending over or lying down until 2-3 hours after eating. What foods are not recommended? These are some foods and drinks that may make your symptoms worse: Vegetables Tomatoes. Tomato juice. Tomato and spaghetti sauce. Chili peppers. Onion and garlic. Horseradish. Fruits Oranges, grapefruit, and lemon (fruit and juice). Meats High-fat meats, fish, and poultry. This includes hot dogs, ribs, ham, sausage, salami, and bacon. Dairy Whole milk and chocolate milk. Sour cream. Cream. Butter. Ice cream. Cream cheese. Drinks Coffee and tea. Bubbly (carbonated) drinks or energy drinks. Condiments Hot sauce. Barbecue sauce. Sweets/Desserts Chocolate and cocoa. Donuts. Peppermint and spearmint. Fats and Oils High-fat foods. This includes [place] fries and potato chips. Other Vinegar. Strong spices. This includes black pepper, white pepper, red pepper, cayenne, curry powder, cloves, ginger, and chili powder. The items listed above may not be a  complete list of foods and drinks to avoid. Contact your dietitian for more information. This information is not intended to replace advice given to you by your health care provider. Make sure you discuss any questions you have with your health care provider. Document Released: 04/29/2012 Document Revised: 04/05/2016 Document Reviewed: 09/02/2013 Elsevier Interactive Patient Education  2017 [employer].

## 2018-04-03 NOTE — Progress Notes (Signed)
Patient ID: Megan Duncan, female   DOB: 1946-07-10, 72 y.o.   MRN: [id number]     Subjective:  I acted as a Education administrator for Dr. Carollee Herter.  Megan Duncan, [place]   Patient ID: Megan Duncan, female    DOB: [birth year], 72 y.o.   MRN: [id number]  Chief Complaint  Patient presents with  . lab work    HPI  Patient is in today for follow up blood work from [employer].  Her TSH and glucose was elevated.  Patient Care Team: Carollee Herter, Alferd Apa, DO as PCP - General (Family Medicine) Luz Brazen as Physician Assistant (Physician Assistant) Milus Banister, MD as Attending Physician (Gastroenterology)   Past Medical History:  Diagnosis Date  . Anxiety   . Arthritis    hands  . Bipolar II disorder ([place])    Dr. Charlott Holler  . COPD (chronic obstructive pulmonary disease) ([place])   . Depression   . Diastolic dysfunction    Per pt, diagnosed after [facility]  . Diverticulosis   . Fibromyalgia   . Hyperlipidemia   . Hypertension   . LBP (low back pain)     Past Surgical History:  Procedure Laterality Date  . APPENDECTOMY    . KNEE ARTHROSCOPY Right 2008  . TONSILLECTOMY    . TOTAL ABDOMINAL HYSTERECTOMY      Family History  Problem Relation Age of Onset  . Colon cancer Maternal Aunt 80  . Diabetes Father   . Hypertension Father   . Heart attack Father   . Alcoholism Father   . Hypertension Mother   . Hiatal hernia Mother   . Mental illness Sister        suicide  . Breast cancer Maternal Aunt     Social History   Socioeconomic History  . Marital status: Married    Spouse name: Not on file  . Number of children: 3  . Years of education: Not on file  . Highest education level: Not on file  Occupational History  . Occupation: retired  Scientific laboratory technician  . Financial resource strain: Not on file  . Food insecurity:    Worry: Not on file    Inability: Not on file  . Transportation needs:    Medical: Not on file    Non-medical: Not on file  Tobacco Use  . Smoking  status: Former Smoker    Packs/day: 1.50    Years: 40.00    Pack years: 60.00    Last attempt to quit: 07/02/2004    Years since quitting: 13.7  . Smokeless tobacco: Never Used  Substance and Sexual Activity  . Alcohol use: Yes    Comment: occasional wine  . Drug use: No  . Sexual activity: Never  Lifestyle  . Physical activity:    Days per week: Not on file    Minutes per session: Not on file  . Stress: Not on file  Relationships  . Social connections:    Talks on phone: Not on file    Gets together: Not on file    Attends religious service: Not on file    Active member of club or organization: Not on file    Attends meetings of clubs or organizations: Not on file    Relationship status: Not on file  . Intimate partner violence:    Fear of current or ex partner: Not on file    Emotionally abused: Not on file    Physically abused: Not on file    Forced  sexual activity: Not on file  Other Topics Concern  . Not on file  Social History Narrative   Lives with husband and grandson she is raising.    Outpatient Medications Prior to Visit  Medication Sig Dispense Refill  . albuterol (VENTOLIN HFA) 108 (90 Base) MCG/ACT inhaler Inhale 2 puffs into the lungs every 4 (four) hours as needed. For shortness of breath and wheezing 18 g 3  . amLODipine (NORVASC) 5 MG tablet TAKE 1 TABLET EVERY DAY  (REPLACES LISINOPRIL/HCTZ) 90 tablet 1  . benzonatate (TESSALON) 100 MG capsule Take 1 capsule (100 mg total) by mouth 3 (three) times daily as needed. 21 capsule 0  . clorazepate (TRANXENE) 3.75 MG tablet Take 1 tablet (3.75 mg total) by mouth 2 (two) times daily as needed for anxiety. 90 tablet 1  . cyclobenzaprine (FLEXERIL) 10 MG tablet Take 1 tablet (10 mg total) by mouth 3 (three) times daily as needed. For muscle spasms 30 tablet 0  . DULoxetine (CYMBALTA) 20 MG capsule TAKE 2 CAPSULE BY MOUTH EVERY DAY 60 capsule 0  . hydrocortisone-pramoxine (ANALPRAM HC) 2.5-1 % rectal cream Place 1  application rectally 3 (three) times daily. (Patient taking differently: Place 1 application rectally as needed. ) 30 g 3  . lithium carbonate 300 MG capsule TK 2 CS HS  1  . nystatin cream (MYCOSTATIN) Apply 1 application topically 2 (two) times daily. 30 g 0  . propranolol (INDERAL) 10 MG tablet Take 10 mg by mouth 2 (two) times daily.    . Pyridoxine HCl (VITAMIN B-6 PO) Take 1 tablet by mouth daily.    . ranitidine (ZANTAC) 150 MG tablet Take 150 mg by mouth as needed.      No facility-administered medications prior to visit.     Allergies  Allergen Reactions  . Atorvastatin Other (See Comments)    Significant rise in liver tests  . Codeine Nausea And Vomiting  . Diclofenac Other (See Comments)    Elevated LFT's  . Doxycycline     Extreme nausea  . Sulfonamide Derivatives Swelling    Review of Systems  Constitutional: Negative for fever and malaise/fatigue.  HENT: Negative for congestion.   Eyes: Negative for blurred vision.  Respiratory: Negative for cough and shortness of breath.   Cardiovascular: Negative for chest pain, palpitations and leg swelling.  Gastrointestinal: Negative for vomiting.  Musculoskeletal: Negative for back pain.  Skin: Negative for rash.  Neurological: Negative for loss of consciousness and headaches.       Objective:    Physical Exam  Constitutional: She is oriented to person, place, and time. She appears well-developed and well-nourished.  HENT:  Head: Normocephalic and atraumatic.  Eyes: Conjunctivae and EOM are normal.  Neck: Normal range of motion. Neck supple. No JVD present. Carotid bruit is not present. No thyromegaly present.  Cardiovascular: Normal rate, regular rhythm and normal heart sounds.  No murmur heard. Pulmonary/Chest: Effort normal and breath sounds normal. No respiratory distress. She has no wheezes. She has no rales. She exhibits no tenderness.  Musculoskeletal: She exhibits no edema.  Neurological: She is alert and  oriented to person, place, and time.  Psychiatric: She exhibits a depressed mood.  Nursing note and vitals reviewed.   BP 130/80 (BP Location: Right Arm, Cuff Size: Large)   Pulse 77   Temp 98.1 F (36.7 C) (Oral)   Resp 16   Ht 5\' 2"  (1.575 m)   Wt 210 lb (95.3 kg)   SpO2 97%  BMI 38.41 kg/m  Wt Readings from Last 3 Encounters:  04/03/18 210 lb (95.3 kg)  09/24/17 205 lb (93 kg)  09/03/17 203 lb 4 oz (92.2 kg)   BP Readings from Last 3 Encounters:  04/03/18 130/80  09/24/17 136/66  09/03/17 128/80     Immunization History  Administered Date(s) Administered  . Influenza Whole 08/17/2009, 08/09/2010, 09/24/2012, 08/11/2013  . Influenza, High Dose Seasonal PF 09/12/2016, 08/30/2017  . Influenza,inj,Quad PF,6+ Mos 07/15/2014, 08/01/2015  . Pneumococcal Conjugate-13 07/15/2014  . Pneumococcal Polysaccharide-23 08/01/2015  . Zoster 09/24/2012    Health Maintenance  Topic Date Due  . Samul Dada  05/05/1965  . COLONOSCOPY  07/17/2015  . MAMMOGRAM  01/31/2018  . INFLUENZA VACCINE  06/12/2018  . DEXA SCAN  Completed  . Hepatitis C Screening  Completed  . PNA vac Low Risk Adult  Completed    Lab Results  Component Value Date   WBC 7.7 08/30/2017   HGB 15.1 08/30/2017   HCT 45.4 (H) 08/30/2017   PLT 393 08/30/2017   GLUCOSE 185 (H) 04/03/2018   CHOL 193 08/30/2017   TRIG 123 08/30/2017   HDL 76 08/30/2017   LDLDIRECT 120.1 12/03/2013   LDLCALC 95 08/30/2017   ALT 16 04/03/2018   AST 19 04/03/2018   NA 142 04/03/2018   K 4.8 04/03/2018   CL 104 04/03/2018   CREATININE 0.88 04/03/2018   BUN 8 04/03/2018   CO2 31 04/03/2018   TSH 2.99 04/03/2018   INR 1.0 10/25/2015   HGBA1C 6.3 04/03/2018    Lab Results  Component Value Date   TSH 2.99 04/03/2018   Lab Results  Component Value Date   WBC 7.7 08/30/2017   HGB 15.1 08/30/2017   HCT 45.4 (H) 08/30/2017   MCV 91.0 08/30/2017   PLT 393 08/30/2017   Lab Results  Component Value Date   NA 142  04/03/2018   K 4.8 04/03/2018   CO2 31 04/03/2018   GLUCOSE 185 (H) 04/03/2018   BUN 8 04/03/2018   CREATININE 0.88 04/03/2018   BILITOT 0.7 04/03/2018   ALKPHOS 70 04/03/2018   AST 19 04/03/2018   ALT 16 04/03/2018   PROT 7.1 04/03/2018   ALBUMIN 4.4 04/03/2018   CALCIUM 10.1 04/03/2018   GFR 67.15 04/03/2018   Lab Results  Component Value Date   CHOL 193 08/30/2017   Lab Results  Component Value Date   HDL 76 08/30/2017   Lab Results  Component Value Date   LDLCALC 95 08/30/2017   Lab Results  Component Value Date   TRIG 123 08/30/2017   Lab Results  Component Value Date   CHOLHDL 2.5 08/30/2017   Lab Results  Component Value Date   HGBA1C 6.3 04/03/2018         Assessment & Plan:   Problem List Items Addressed This Visit      Unprioritized   Bipolar 2 disorder ([place])    Per psych      Relevant Orders   Amb Ref to Medical Weight Management    Other Visit Diagnoses    Abnormal thyroid function test    -  Primary   Relevant Orders   Thyroid Panel With TSH (Completed)   Amb Ref to Medical Weight Management   Hyperglycemia       Relevant Orders   Comprehensive metabolic panel (Completed)   Amb Ref to Medical Weight Management   Hemoglobin A1c (Completed)   Morbid obesity ([place])       Relevant  Orders   Amb Ref to Medical Weight Management      I am having [name] "[name]" maintain her cyclobenzaprine, clorazepate, hydrocortisone-pramoxine, ranitidine, DULoxetine, lithium carbonate, propranolol, benzonatate, albuterol, Pyridoxine HCl (VITAMIN B-6 PO), nystatin cream, and amLODipine.  No orders of the defined types were placed in this encounter.   CMA served as Education administrator during this visit. History, Physical and Plan performed by medical provider. Documentation and orders reviewed and attested to.  Ann Held, DO

## 2018-04-03 NOTE — Assessment & Plan Note (Signed)
Per psych 

## 2018-04-08 ENCOUNTER — Telehealth: Payer: Self-pay | Admitting: Family Medicine

## 2018-04-08 NOTE — Telephone Encounter (Signed)
See lab results.  Left message on machine to call back

## 2018-04-08 NOTE — Telephone Encounter (Signed)
Copied from [place] [id number]. Topic: Quick Communication - See Telephone Encounter >> Apr 08, 2018 10:33 AM Aurelio Brash B wrote: CRM for notification. See Telephone encounter for: 04/08/18.  PT would like a call back from Dr Etter Sjogren 's nurses to discuss  her recent lab results-   hypoglycemia

## 2018-04-10 ENCOUNTER — Other Ambulatory Visit: Payer: Self-pay | Admitting: *Deleted

## 2018-04-10 MED ORDER — METFORMIN HCL 500 MG PO TABS: 500.0000 mg | ORAL_TABLET | Freq: Two times a day (BID) | ORAL | 2 refills | Status: DC

## 2018-04-10 MED ORDER — BLOOD GLUCOSE METER KIT: PACK | 0 refills | Status: DC

## 2018-04-10 MED ORDER — LANCETS MISC: 1 refills | Status: DC

## 2018-04-10 MED ORDER — GLUCOSE BLOOD VI STRP: ORAL_STRIP | 12 refills | Status: DC

## 2018-04-11 ENCOUNTER — Telehealth: Payer: Self-pay | Admitting: Family Medicine

## 2018-04-11 NOTE — Telephone Encounter (Signed)
Copied from [place] [id number]. Topic: Quick Communication - Rx Refill/Question >> Apr 11, 2018 10:13 AM Marin Olp L wrote: Medication: order diabetic supplies sent to wrong pharmacy  Has the patient contacted their pharmacy? Yes.   (Agent: If no, request that the patient contact the pharmacy for the refill.) (Agent: If yes, when and what did the pharmacy advise?)  Preferred Pharmacy (with phone number or street name): [facility], [place] [address] Phone: [phone] Fax: [phone]  Agent: Please be advised that RX refills may take up to 3 business days. We ask that you follow-up with your pharmacy.

## 2018-04-14 DIAGNOSIS — F3181 Bipolar II disorder: Secondary | ICD-10-CM | POA: Diagnosis not present

## 2018-04-15 ENCOUNTER — Other Ambulatory Visit: Payer: Self-pay

## 2018-04-15 MED ORDER — GLUCOSE BLOOD VI STRP: ORAL_STRIP | 12 refills | Status: DC

## 2018-04-15 MED ORDER — BLOOD GLUCOSE METER KIT: PACK | 0 refills | Status: DC

## 2018-04-15 NOTE — Telephone Encounter (Signed)
New Rx has been sent to [facility]

## 2018-04-21 ENCOUNTER — Other Ambulatory Visit: Payer: Self-pay | Admitting: *Deleted

## 2018-04-21 ENCOUNTER — Telehealth: Payer: Self-pay | Admitting: Family Medicine

## 2018-04-21 DIAGNOSIS — E119 Type 2 diabetes mellitus without complications: Secondary | ICD-10-CM

## 2018-04-21 NOTE — Telephone Encounter (Signed)
Author spoke with Guerry Bruin, [place] who spoke with Dr. Etter Sjogren regarding pt's concerns.. Diabetes education referral to be placed per Dr .Nonda Lou request. Author phoned pt. To relay information and ask any follow-up questions. Pt. stated she has received pamphlets on diabetes from the office via mail.  Author spent 20 minutes over the phone providing diabetes education. Pt. Stated she has not taken her metformin yet or used her meter, but is modifying her diet and starting to exercise more. Author encouraged pt. To keep log of fasting BG readings, the food she eats, and how she's feeling everyday so as to establish trends. Nurse visit appointment made for 6/12 at 10AM to provide meter use education and follow-up on diabetes education referral. Pt. Reminded to bring meter with her, and pt. Appreciative of education.

## 2018-04-21 NOTE — Telephone Encounter (Signed)
Patient called and says "I was in the office, had lab work done and was told I am a diabetic. I picked up the Metformin, picked up my glucometer and realize I don't know what numbers to look for, what is too high or too low, I just don't know what to do. I haven't taken the Metformin this morning, because I didn't know if I should or not. I understood about watching my sugars and starches, but did not get an understanding about anything else. Also, I would benefit from seeing a nutritionist and going to a class for diabetes education." I asked her to hold while I call to the office, she agreed. I called and spoke to [place], [facility], informed her of the above, she says to let the patient know she will call her later on today to discuss her concerns. The patient was advised of Emily's recommendations and verbalized understanding.

## 2018-04-23 ENCOUNTER — Ambulatory Visit (INDEPENDENT_AMBULATORY_CARE_PROVIDER_SITE_OTHER): Payer: Medicare Other

## 2018-04-23 DIAGNOSIS — E119 Type 2 diabetes mellitus without complications: Secondary | ICD-10-CM | POA: Diagnosis not present

## 2018-04-23 NOTE — Progress Notes (Addendum)
Pre visit review using our clinic tool,if applicable. No additional management support is needed unless otherwise documented below in the visit note.   Patient in for fasting BG che and glucometer education per order from Dr. Roma Schanz.  Diabetic Education given on how to use Glucometer. Return demonstratoin given by patient properly.  Blood sugar = 172  patient states she ate candy on the way up to this appointment.  Patient states  She would rather try and control her Diabetes by modifying her diet before she starts the Metformin ordered. Advised patient I would pass this information to Dr. Carollee Herter.  Reviewed and agree with the plan. Pt will be due for a1-c around 07-04-2018. Recommend she get scheduled at that time.  Mackie Pai, PA-C

## 2018-04-28 ENCOUNTER — Telehealth: Payer: Self-pay | Admitting: Family Medicine

## 2018-04-28 NOTE — Telephone Encounter (Signed)
Copied from [place] [id number]. Topic: Quick Communication - Rx Refill/Question >> Apr 28, 2018 12:33 PM Neva Seat wrote: Needing a prescription of Strips - Lancets  for OneTouch Verio Flex meter.   [facility] [place], [address] AT [facility] [address] [place] [address] Phone: [phone] Fax: [phone]

## 2018-04-28 NOTE — Telephone Encounter (Signed)
Check labs 3 months

## 2018-04-28 NOTE — Telephone Encounter (Unsigned)
Copied from [place] [phone]. Topic: Quick Communication - See Telephone Encounter >> Apr 28, 2018 12:42 PM Neva Seat wrote: Pt wanted to let Dr. Etter Sjogren know she has not started the metFORMIN (GLUCOPHAGE) 500 MG tablet that was prescribed for her. Pt has been staying in range thru diet and exercise.  Pt wants to let dr know and to call her back to let her know what dr thinks.

## 2018-04-28 NOTE — Telephone Encounter (Addendum)
Pt requesting rx for strips and lancets  for one touch verio flex meter and lancets  Pt was RX  Accucheck Aviva  With lancets and strips  At [address]  on 04/10/2018  Per her insurance per [place] at [address]  Pt was provided a One Touch Vero at the practice later  was given instructions on its use and likes this machine  Spoke with [place] at [employer] and she stated her insurance would not cover the supplies for the [employer] One Touch meter

## 2018-04-29 ENCOUNTER — Telehealth: Payer: Self-pay

## 2018-04-29 NOTE — Telephone Encounter (Signed)
Spoke with Raquel Sarna at [facility] and explained the circumstances she states she will call the patient discuss the situation  Patient notified of above and was to told to expect a call

## 2018-04-29 NOTE — Telephone Encounter (Signed)
Author phoned pt. Re: verio test strips. Pt. Made aware of option to pay out of pocket for verio test strips if verio meter is pt's preferred meter to use, and it would be $109.99 for 100 strips per [facility] pharmacy tech. Pt's current insurance only covers the strips for accucheck. Pt. stated she does not want to pay that amount, and is OK with using accucheck, "but for some reason the nurse gave me this other meter, and showed me how to use it, but I can't afford the strips". Author offered to personally review accucheck with her at pt's convenience. Pt. stated she would stop by tomorrow for education, at no cost to patient. Pt. also wanted to make sure that it was OK to not start metformin, as pt. has been doing diet and exercise and has noticed her fasting BGs in the AM to be within good range (90s, low 100s). Author will consult with Dr. Etter Sjogren, routed to Dr. Etter Sjogren for review.

## 2018-04-29 NOTE — Telephone Encounter (Signed)
Ok to wait on metformin Please send in accu check to pharmacy Explain to pt --  When we give out a meter we do not know what the ins covers

## 2018-04-29 NOTE — Telephone Encounter (Signed)
Per Dr Nonda Lou order, will notify pt. About holding off on metformin per pt. Request when she arrives for accucheck education 6/19. Pt. Already has meter and supplies, and will bring with her tomorrow.

## 2018-05-01 NOTE — Telephone Encounter (Signed)
See other phone note

## 2018-05-09 ENCOUNTER — Ambulatory Visit: Payer: Medicare Other | Admitting: *Deleted

## 2018-05-14 NOTE — Progress Notes (Deleted)
Subjective:   Megan Duncan is a 72 y.o. female who presents for Medicare Annual (Subsequent) preventive examination.  Review of Systems: No ROS.  Medicare Wellness Visit. Additional risk factors are reflected in the social history.   Sleep patterns: Home Safety/Smoke Alarms: Feels safe in home. Smoke alarms in place.  Living environment; residence and Firearm Safety:    Female:     Mammo-       Dexa scan-        CCS-     Objective:     Vitals: There were no vitals taken for this visit.  There is no height or weight on file to calculate BMI.  Advanced Directives 01/10/2017 04/07/2015  Does Patient Have a Medical Advance Directive? No Yes  Type of Advance Directive - [place];Living will  Does patient want to make changes to medical advance directive? - No - Patient declined  Copy of [place] in Chart? - No - copy requested  Would patient like information on creating a medical advance directive? No - Patient declined -    Tobacco Social History   Tobacco Use  Smoking Status Former Smoker  . Packs/day: 1.50  . Years: 40.00  . Pack years: 60.00  . Last attempt to quit: 07/02/2004  . Years since quitting: 13.8  Smokeless Tobacco Never Used     Counseling given: Not Answered   Clinical Intake:                       Past Medical History:  Diagnosis Date  . Anxiety   . Arthritis    hands  . Bipolar II disorder ([place])    Dr. Charlott Holler  . COPD (chronic obstructive pulmonary disease) ([place])   . Depression   . Diastolic dysfunction    Per pt, diagnosed after [facility]  . Diverticulosis   . Fibromyalgia   . Hyperlipidemia   . Hypertension   . LBP (low back pain)    Past Surgical History:  Procedure Laterality Date  . APPENDECTOMY    . KNEE ARTHROSCOPY Right 2008  . TONSILLECTOMY    . TOTAL ABDOMINAL HYSTERECTOMY     Family History  Problem Relation Age of Onset  . Colon cancer Maternal Aunt 80  . Diabetes  Father   . Hypertension Father   . Heart attack Father   . Alcoholism Father   . Hypertension Mother   . Hiatal hernia Mother   . Mental illness Sister        suicide  . Breast cancer Maternal Aunt    Social History   Socioeconomic History  . Marital status: Married    Spouse name: Not on file  . Number of children: 3  . Years of education: Not on file  . Highest education level: Not on file  Occupational History  . Occupation: retired  Scientific laboratory technician  . Financial resource strain: Not on file  . Food insecurity:    Worry: Not on file    Inability: Not on file  . Transportation needs:    Medical: Not on file    Non-medical: Not on file  Tobacco Use  . Smoking status: Former Smoker    Packs/day: 1.50    Years: 40.00    Pack years: 60.00    Last attempt to quit: 07/02/2004    Years since quitting: 13.8  . Smokeless tobacco: Never Used  Substance and Sexual Activity  . Alcohol use: Yes  Comment: occasional wine  . Drug use: No  . Sexual activity: Never  Lifestyle  . Physical activity:    Days per week: Not on file    Minutes per session: Not on file  . Stress: Not on file  Relationships  . Social connections:    Talks on phone: Not on file    Gets together: Not on file    Attends religious service: Not on file    Active member of club or organization: Not on file    Attends meetings of clubs or organizations: Not on file    Relationship status: Not on file  Other Topics Concern  . Not on file  Social History Narrative   Lives with husband and grandson she is raising.    Outpatient Encounter Medications as of 05/23/2018  Medication Sig  . albuterol (VENTOLIN HFA) 108 (90 Base) MCG/ACT inhaler Inhale 2 puffs into the lungs every 4 (four) hours as needed. For shortness of breath and wheezing  . amLODipine (NORVASC) 5 MG tablet TAKE 1 TABLET EVERY DAY  (REPLACES LISINOPRIL/HCTZ)  . benzonatate (TESSALON) 100 MG capsule Take 1 capsule (100 mg total) by mouth 3  (three) times daily as needed.  . blood glucose meter kit and supplies Dispense based on patient and insurance preference. Use once a day.  DX Code: E11.9  . clorazepate (TRANXENE) 3.75 MG tablet Take 1 tablet (3.75 mg total) by mouth 2 (two) times daily as needed for anxiety.  . cyclobenzaprine (FLEXERIL) 10 MG tablet Take 1 tablet (10 mg total) by mouth 3 (three) times daily as needed. For muscle spasms  . DULoxetine (CYMBALTA) 20 MG capsule TAKE 2 CAPSULE BY MOUTH EVERY DAY  . glucose blood test strip Use as instructed once a day.  Dx Code: E11.9  . hydrocortisone-pramoxine (ANALPRAM HC) 2.5-1 % rectal cream Place 1 application rectally 3 (three) times daily. (Patient taking differently: Place 1 application rectally as needed. )  . Lancets MISC Use as directed once a day.  Dx code: E11.9  . lithium carbonate 300 MG capsule TK 2 CS HS  . metFORMIN (GLUCOPHAGE) 500 MG tablet Take 1 tablet (500 mg total) by mouth 2 (two) times daily with a meal.  . nystatin cream (MYCOSTATIN) Apply 1 application topically 2 (two) times daily.  . propranolol (INDERAL) 10 MG tablet Take 10 mg by mouth 2 (two) times daily.  . Pyridoxine HCl (VITAMIN B-6 PO) Take 1 tablet by mouth daily.  . ranitidine (ZANTAC) 150 MG tablet Take 150 mg by mouth as needed.    No facility-administered encounter medications on file as of 05/23/2018.     Activities of Daily Living No flowsheet data found.  Patient Care Team: Carollee Herter, Alferd Apa, DO as PCP - General (Family Medicine) Luz Brazen as Physician Assistant (Physician Assistant) Milus Banister, MD as Attending Physician (Gastroenterology)    Assessment:   This is a routine wellness examination for Megan Duncan.Physical assessment deferred to PCP.  Exercise Activities and Dietary recommendations   Diet (meal preparation, eat out, water intake, caffeinated beverages, dairy products, fruits and vegetables): {Desc; diets:16563} Breakfast: Lunch:  Dinner:       Goals    None      Fall Risk Fall Risk  01/10/2017 10/18/2015 08/01/2015 07/15/2014  Falls in the past year? No No No No     Depression Screen PHQ 2/9 Scores 01/10/2017 10/18/2015 08/01/2015 07/15/2014  PHQ - 2 Score 6 0 0 6  PHQ- 9 Score 16 - -  16  Exception Documentation - Patient refusal - -     Cognitive Function MMSE - Mini Mental State Exam 01/10/2017  Orientation to time 5  Orientation to Place 5  Registration 3  Attention/ Calculation 4  Recall 3  Language- name 2 objects 2  Language- repeat 1  Language- follow 3 step command 3  Language- read & follow direction 1  Write a sentence 1  Copy design 1  Total score 29        Immunization History  Administered Date(s) Administered  . Influenza Whole 08/17/2009, 08/09/2010, 09/24/2012, 08/11/2013  . Influenza, High Dose Seasonal PF 09/12/2016, 08/30/2017  . Influenza,inj,Quad PF,6+ Mos 07/15/2014, 08/01/2015  . Pneumococcal Conjugate-13 07/15/2014  . Pneumococcal Polysaccharide-23 08/01/2015  . Zoster 09/24/2012    Screening Tests Health Maintenance  Topic Date Due  . FOOT EXAM  05/05/1956  . OPHTHALMOLOGY EXAM  05/05/1956  . URINE MICROALBUMIN  05/05/1956  . TETANUS/TDAP  05/05/1965  . COLONOSCOPY  07/17/2015  . MAMMOGRAM  01/31/2018  . INFLUENZA VACCINE  06/12/2018  . HEMOGLOBIN A1C  10/04/2018  . DEXA SCAN  Completed  . Hepatitis C Screening  Completed  . PNA vac Low Risk Adult  Completed       Plan:   ***   I have personally reviewed and noted the following in the patient's chart:   . Medical and social history . Use of alcohol, tobacco or illicit drugs  . Current medications and supplements . Functional ability and status . Nutritional status . Physical activity . Advanced directives . List of other physicians . Hospitalizations, surgeries, and ER visits in previous 12 months . Vitals . Screenings to include cognitive, depression, and falls . Referrals and appointments  In addition, I have  reviewed and discussed with patient certain preventive protocols, quality metrics, and best practice recommendations. A written personalized care plan for preventive services as well as general preventive health recommendations were provided to patient.     Shela Nevin, [place]  05/14/2018

## 2018-05-23 ENCOUNTER — Telehealth: Payer: Self-pay

## 2018-05-23 ENCOUNTER — Ambulatory Visit: Payer: Medicare Other | Admitting: *Deleted

## 2018-05-23 NOTE — Telephone Encounter (Signed)
Copied from [place] [id number]. Topic: Quick Communication - Appointment Cancellation >> May 23, 2018  8:22 AM Synthia Innocent wrote: Patient called to cancel appointment scheduled for 05/23/18. Patient has rescheduled their appointment.  Route to department's PEC pool.

## 2018-06-05 ENCOUNTER — Encounter: Payer: Self-pay | Admitting: Family Medicine

## 2018-06-05 ENCOUNTER — Ambulatory Visit (INDEPENDENT_AMBULATORY_CARE_PROVIDER_SITE_OTHER): Payer: Medicare Other | Admitting: Family Medicine

## 2018-06-05 VITALS — BP 130/76 | HR 58 | Temp 98.4°F | Resp 16 | Ht 62.0 in | Wt 204.0 lb

## 2018-06-05 DIAGNOSIS — Z87891 Personal history of nicotine dependence: Secondary | ICD-10-CM

## 2018-06-05 DIAGNOSIS — E1151 Type 2 diabetes mellitus with diabetic peripheral angiopathy without gangrene: Secondary | ICD-10-CM | POA: Diagnosis not present

## 2018-06-05 DIAGNOSIS — I10 Essential (primary) hypertension: Secondary | ICD-10-CM | POA: Diagnosis not present

## 2018-06-05 DIAGNOSIS — E1165 Type 2 diabetes mellitus with hyperglycemia: Secondary | ICD-10-CM | POA: Diagnosis not present

## 2018-06-05 DIAGNOSIS — E782 Mixed hyperlipidemia: Secondary | ICD-10-CM | POA: Diagnosis not present

## 2018-06-05 DIAGNOSIS — IMO0002 Reserved for concepts with insufficient information to code with codable children: Secondary | ICD-10-CM

## 2018-06-05 DIAGNOSIS — E119 Type 2 diabetes mellitus without complications: Secondary | ICD-10-CM | POA: Insufficient documentation

## 2018-06-05 HISTORY — DX: Type 2 diabetes mellitus without complications: E11.9

## 2018-06-05 NOTE — Assessment & Plan Note (Signed)
Well controlled, no changes to meds. Encouraged heart healthy diet such as the DASH diet and exercise as tolerated.  °

## 2018-06-05 NOTE — Progress Notes (Signed)
Patient ID: Megan Duncan, female    DOB: 1946-09-21  Age: 72 y.o. MRN: [id number]    Subjective:  Subjective  HPI Megan Duncan presents for f/u bp and dm.  Pt has been eating well and has lost over 10 lbs.     HYPERTENSION   Blood pressure range-not check ing  Chest pain- no      Dyspnea- no Lightheadedness- no   Edema- no  Other side effects - no   Medication compliance: good Low salt diet- yes    DIABETES    Blood Sugar ranges-105-140 Polyuria- no New Visual problems- no  Hypoglycemic symptoms- no  Other side effects-no Medication compliance - na Last eye exam- due Foot exam- today     Review of Systems  Constitutional: Negative for appetite change, diaphoresis, fatigue and unexpected weight change.  Eyes: Negative for pain, redness and visual disturbance.  Respiratory: Negative for cough, chest tightness, shortness of breath and wheezing.   Cardiovascular: Negative for chest pain, palpitations and leg swelling.  Endocrine: Negative for cold intolerance, heat intolerance, polydipsia, polyphagia and polyuria.  Genitourinary: Negative for difficulty urinating, dysuria and frequency.  Neurological: Negative for dizziness, light-headedness, numbness and headaches.    History Past Medical History:  Diagnosis Date  . Anxiety   . Arthritis    hands  . Bipolar II disorder ([place])    Dr. Charlott Holler  . COPD (chronic obstructive pulmonary disease) ([place])   . Depression   . Diastolic dysfunction    Per pt, diagnosed after [facility]  . Diverticulosis   . Fibromyalgia   . Hyperlipidemia   . Hypertension   . LBP (low back pain)     She has a past surgical history that includes Appendectomy; Total abdominal hysterectomy; Tonsillectomy; and Knee arthroscopy (Right, 2008).   Her family history includes Alcoholism in her father; Breast cancer in her maternal aunt; Colon cancer (age of onset: 20) in her maternal aunt; Diabetes in her father; Heart attack in her father;  Hiatal hernia in her mother; Hypertension in her father and mother; Mental illness in her sister.She reports that she quit smoking about 13 years ago. She has a 60.00 pack-year smoking history. She has never used smokeless tobacco. She reports that she drinks alcohol. She reports that she does not use drugs.  Current Outpatient Medications on File Prior to Visit  Medication Sig Dispense Refill  . albuterol (VENTOLIN HFA) 108 (90 Base) MCG/ACT inhaler Inhale 2 puffs into the lungs every 4 (four) hours as needed. For shortness of breath and wheezing 18 g 3  . amLODipine (NORVASC) 5 MG tablet TAKE 1 TABLET EVERY DAY  (REPLACES LISINOPRIL/HCTZ) 90 tablet 1  . benzonatate (TESSALON) 100 MG capsule Take 1 capsule (100 mg total) by mouth 3 (three) times daily as needed. 21 capsule 0  . blood glucose meter kit and supplies Dispense based on patient and insurance preference. Use once a day.  DX Code: E11.9 1 each 0  . clorazepate (TRANXENE) 3.75 MG tablet Take 1 tablet (3.75 mg total) by mouth 2 (two) times daily as needed for anxiety. 90 tablet 1  . cyclobenzaprine (FLEXERIL) 10 MG tablet Take 1 tablet (10 mg total) by mouth 3 (three) times daily as needed. For muscle spasms 30 tablet 0  . DULoxetine (CYMBALTA) 20 MG capsule TAKE 2 CAPSULE BY MOUTH EVERY DAY 60 capsule 0  . glucose blood test strip Use as instructed once a day.  Dx Code: E11.9 100 each 12  . hydrocortisone-pramoxine (  ANALPRAM HC) 2.5-1 % rectal cream Place 1 application rectally 3 (three) times daily. (Patient taking differently: Place 1 application rectally as needed. ) 30 g 3  . Lancets MISC Use as directed once a day.  Dx code: E11.9 100 each 1  . lithium carbonate 300 MG capsule TK 2 CS HS  1  . nystatin cream (MYCOSTATIN) Apply 1 application topically 2 (two) times daily. 30 g 0  . propranolol (INDERAL) 10 MG tablet Take 10 mg by mouth 2 (two) times daily.    . Pyridoxine HCl (VITAMIN B-6 PO) Take 1 tablet by mouth daily.    .  ranitidine (ZANTAC) 150 MG tablet Take 150 mg by mouth as needed.      No current facility-administered medications on file prior to visit.      Objective:  Objective  Physical Exam  Constitutional: She is oriented to person, place, and time. She appears well-developed and well-nourished.  HENT:  Head: Normocephalic and atraumatic.  Eyes: Conjunctivae and EOM are normal.  Neck: Normal range of motion. Neck supple. No JVD present. Carotid bruit is not present. No thyromegaly present.  Cardiovascular: Normal rate, regular rhythm and normal heart sounds.  No murmur heard. Pulmonary/Chest: Effort normal and breath sounds normal. No respiratory distress. She has no wheezes. She has no rales. She exhibits no tenderness.  Musculoskeletal: She exhibits no edema.  Neurological: She is alert and oriented to person, place, and time.  Psychiatric: She has a normal mood and affect.  Nursing note and vitals reviewed.  Diabetic Foot Exam - Simple   Simple Foot Form Diabetic Foot exam was performed with the following findings:  Yes 06/05/2018 12:07 PM  Visual Inspection No deformities, no ulcerations, no other skin breakdown bilaterally:  Yes Sensation Testing Intact to touch and monofilament testing bilaterally:  Yes Pulse Check Posterior Tibialis and Dorsalis pulse intact bilaterally:  Yes Comments     BP 130/76 (BP Location: Right Arm, Cuff Size: Large)   Pulse (!) 58   Temp 98.4 F (36.9 C) (Oral)   Resp 16   Ht '5\' 2"'$  (1.575 m)   Wt 204 lb (92.5 kg)   SpO2 96%   BMI 37.31 kg/m  Wt Readings from Last 3 Encounters:  06/05/18 204 lb (92.5 kg)  04/03/18 210 lb (95.3 kg)  09/24/17 205 lb (93 kg)     Lab Results  Component Value Date   WBC 7.7 08/30/2017   HGB 15.1 08/30/2017   HCT 45.4 (H) 08/30/2017   PLT 393 08/30/2017   GLUCOSE 185 (H) 04/03/2018   CHOL 193 08/30/2017   TRIG 123 08/30/2017   HDL 76 08/30/2017   LDLDIRECT 120.1 12/03/2013   LDLCALC 95 08/30/2017   ALT  16 04/03/2018   AST 19 04/03/2018   NA 142 04/03/2018   K 4.8 04/03/2018   CL 104 04/03/2018   CREATININE 0.88 04/03/2018   BUN 8 04/03/2018   CO2 31 04/03/2018   TSH 2.99 04/03/2018   INR 1.0 10/25/2015   HGBA1C 6.3 04/03/2018    Mr Lumbar Spine Wo Contrast  Result Date: 09/29/2017 CLINICAL DATA:  Low back pain extending to the right hip and leg for 8 years. EXAM: MRI LUMBAR SPINE WITHOUT CONTRAST TECHNIQUE: Multiplanar, multisequence MR imaging of the lumbar spine was performed. No intravenous contrast was administered. COMPARISON:  MRI of the lumbar spine 01/30/2010 FINDINGS: Segmentation: 5 non rib-bearing lumbar type vertebral bodies are present. Alignment: Slight anterolisthesis at L4-5 is stable. AP alignment is  otherwise anatomic. Rightward curvature of the lumbar spine is centered at L2-3. Vertebrae: Chronic endplate marrow changes on the left at L5-S1 have progressed. Conus medullaris: Extends to the L1 level and appears normal. Paraspinal and other soft tissues: A simple cyst at the upper pole of the right kidney has increased in size, now measuring 3.2 cm. Other subcentimeter cysts are present bilaterally. No significant adenopathy is present. Disc levels: L1-2: Mild facet hypertrophy is present. There is mild disc bulging without significant focal stenosis or change. L2-3: A broad-based disc protrusion has progressed. Moderate facet hypertrophy is progressed as well. Mild left subarticular narrowing is now present. There is mild foraminal narrowing bilaterally. L3-4: A mild broad-based disc protrusion is present. Moderate facet hypertrophy has progressed. Central canal is patent. Mild foraminal narrowing is present. L4-5: A broad-based disc protrusion is present. Advanced facet hypertrophy has progressed. There is progressive moderate central canal stenosis with left greater than right subarticular narrowing. Moderate foraminal narrowing has progressed bilaterally, left greater than  right. L5-S1: A far right lateral disc protrusion extends into the right neural foramen with mild right foraminal stenosis, increased from the prior study. Moderate facet hypertrophy has progressed, left greater than right. This results in mild left subarticular and foraminal narrowing. IMPRESSION: 1. 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 2. Progressive mild left subarticular and foraminal narrowing at L to 3. 3. Mild foraminal narrowing at L3-4 has progressed slightly. 4. Progressive moderate central canal and foraminal stenosis at L4-5, left greater than right. 5. Progressive far right lateral disc protrusion extends into the right neural foramen with mild right foraminal stenosis. 6. Progressive left-sided facet hypertrophy and spurring at L5-S1 with mild left subarticular and foraminal narrowing. Electronically Signed   By: San Morelle M.D.   On: 09/29/2017 14:21     Assessment & Plan:  Plan  I have discontinued Megan Huff "[name]"'s metFORMIN. I am also having her maintain her cyclobenzaprine, clorazepate, hydrocortisone-pramoxine, ranitidine, DULoxetine, lithium carbonate, propranolol, benzonatate, albuterol, Pyridoxine HCl (VITAMIN B-6 PO), nystatin cream, amLODipine, Lancets, glucose blood, and blood glucose meter kit and supplies.  No orders of the defined types were placed in this encounter.   Problem List Items Addressed This Visit      Unprioritized   DM (diabetes mellitus) type II uncontrolled, periph vascular disorder ([place]) - Primary    hgba1c to be done minimize simple carbs. Increase exercise as tolerated. Continue current meds       Relevant Orders   Lipid panel   Hemoglobin A1c   Comprehensive metabolic panel   Microalbumin / creatinine urine ratio   Essential hypertension    Well controlled, no changes to meds. Encouraged heart healthy diet such as the DASH diet and exercise as tolerated.       Relevant Orders   Lipid  panel   Hemoglobin A1c   Comprehensive metabolic panel   Microalbumin / creatinine urine ratio   Hyperlipidemia    Encouraged heart healthy diet, increase exercise, avoid trans fats, consider a krill oil cap daily       Other Visit Diagnoses    Former smoker       Relevant Orders   Ambulatory Referral for Lung Cancer Scre      Follow-up: Return in about 6 months (around 12/06/2018), or if symptoms worsen or fail to improve, for hypertension, hyperlipidemia, diabetes II.  Ann Held, DO

## 2018-06-05 NOTE — Patient Instructions (Signed)

## 2018-06-05 NOTE — Assessment & Plan Note (Addendum)
Encouraged heart healthy diet, increase exercise, avoid trans fats, consider a krill oil cap daily 

## 2018-06-05 NOTE — Assessment & Plan Note (Signed)
hgba1c to be done minimize simple carbs. Increase exercise as tolerated. Continue current meds  

## 2018-06-06 ENCOUNTER — Ambulatory Visit: Payer: Medicare Other | Admitting: Registered"

## 2018-06-09 DIAGNOSIS — F3181 Bipolar II disorder: Secondary | ICD-10-CM | POA: Diagnosis not present

## 2018-06-10 ENCOUNTER — Encounter (INDEPENDENT_AMBULATORY_CARE_PROVIDER_SITE_OTHER): Payer: Self-pay | Admitting: Family Medicine

## 2018-06-11 NOTE — Telephone Encounter (Signed)
Can you reschedule this is a cancellation for info session?

## 2018-06-12 ENCOUNTER — Encounter (INDEPENDENT_AMBULATORY_CARE_PROVIDER_SITE_OTHER): Payer: Self-pay

## 2018-06-13 ENCOUNTER — Other Ambulatory Visit: Payer: Self-pay | Admitting: Acute Care

## 2018-06-13 ENCOUNTER — Other Ambulatory Visit: Payer: Self-pay

## 2018-06-13 DIAGNOSIS — Z122 Encounter for screening for malignant neoplasm of respiratory organs: Secondary | ICD-10-CM

## 2018-06-13 DIAGNOSIS — Z87891 Personal history of nicotine dependence: Secondary | ICD-10-CM

## 2018-06-13 NOTE — Progress Notes (Signed)
Subjective:   Megan Duncan is a 72 y.o. female who presents for Medicare Annual (Subsequent) preventive examination.  Review of Systems: No ROS.  Medicare Wellness Visit. Additional risk factors are reflected in the social history. Cardiac Risk Factors include: advanced age (>85mn, >>73women);diabetes mellitus;dyslipidemia;hypertension Sleep patterns: Sleeps about 8 hrs. Naps during day as neede Home Safety/Smoke Alarms: Feels safe in home. Smoke alarms in place.  Living environment; residence and Firearm Safety: Lives with husband in 2 story home.     Female:       Mammo-  ordered     Dexa scan- utd       CCS- pt states she will call to schedule.     Objective:     Vitals: BP (!) 142/68 (BP Location: Right Wrist, Patient Position: Sitting, Cuff Size: Normal)   Pulse 60   Ht '5\' 2"'  (1.575 m)   Wt 199 lb 9.6 oz (90.5 kg)   SpO2 98%   BMI 36.51 kg/m   Body mass index is 36.51 kg/m.  Advanced Directives 06/17/2018 01/10/2017 04/07/2015  Does Patient Have a Medical Advance Directive? No No Yes  Type of Advance Directive - -Public librarianLiving will  Does patient want to make changes to medical advance directive? - - No - Patient declined  Copy of HDiabloin Chart? - - No - copy requested  Would patient like information on creating a medical advance directive? No - Patient declined No - Patient declined -    Tobacco Social History   Tobacco Use  Smoking Status Former Smoker  . Packs/day: 1.50  . Years: 40.00  . Pack years: 60.00  . Last attempt to quit: 07/02/2004  . Years since quitting: 13.9  Smokeless Tobacco Never Used     Counseling given: Not Answered   Clinical Intake: Pain : No/denies pain    Past Medical History:  Diagnosis Date  . Anxiety   . Arthritis    hands  . Bipolar II disorder ([facility]    Dr. CCharlott Holler . COPD (chronic obstructive pulmonary disease) ([facility]   . Depression   . Diastolic dysfunction    [facility] . Diverticulosis   . Fibromyalgia   . Hyperlipidemia   . Hypertension   . LBP (low back pain)    Past Surgical History:  Procedure Laterality Date  . APPENDECTOMY    . KNEE ARTHROSCOPY Right 2008  . TONSILLECTOMY    . TOTAL ABDOMINAL HYSTERECTOMY     Family History  Problem Relation Age of Onset  . Colon cancer Maternal Aunt 80  . Diabetes Father   . Hypertension Father   . Heart attack Father   . Alcoholism Father   . Hypertension Mother   . Hiatal hernia Mother   . Mental illness Sister        suicide  . Breast cancer Maternal Aunt    Social History   Socioeconomic History  . Marital status: Married    Spouse name: Not on file  . Number of children: 3  . Years of education: Not on file  . Highest education level: Not on file  Occupational History  . Occupation: retired  SScientific laboratory technician . Financial resource strain: Not on file  . Food insecurity:    Worry: Not on file    Inability: Not on file  . Transportation needs:    Medical: Not on file    Non-medical: Not on  file  Tobacco Use  . Smoking status: Former Smoker    Packs/day: 1.50    Years: 40.00    Pack years: 60.00    Last attempt to quit: 07/02/2004    Years since quitting: 13.9  . Smokeless tobacco: Never Used  Substance and Sexual Activity  . Alcohol use: Yes    Comment: occasional wine  . Drug use: No  . Sexual activity: Never  Lifestyle  . Physical activity:    Days per week: Not on file    Minutes per session: Not on file  . Stress: Not on file  Relationships  . Social connections:    Talks on phone: Not on file    Gets together: Not on file    Attends religious service: Not on file    Active member of club or organization: Not on file    Attends meetings of clubs or organizations: Not on file    Relationship status: Not on file  Other Topics Concern  . Not on file  Social History Narrative   Lives with husband and grandson she is raising.    Outpatient  Encounter Medications as of 06/17/2018  Medication Sig  . albuterol (VENTOLIN HFA) 108 (90 Base) MCG/ACT inhaler Inhale 2 puffs into the lungs every 4 (four) hours as needed. For shortness of breath and wheezing  . amLODipine (NORVASC) 5 MG tablet TAKE 1 TABLET EVERY DAY  (REPLACES LISINOPRIL/HCTZ)  . benzonatate (TESSALON) 100 MG capsule Take 1 capsule (100 mg total) by mouth 3 (three) times daily as needed.  . blood glucose meter kit and supplies Dispense based on patient and insurance preference. Use once a day.  DX Code: E11.9  . clorazepate (TRANXENE) 3.75 MG tablet Take 1 tablet (3.75 mg total) by mouth 2 (two) times daily as needed for anxiety.  . cyclobenzaprine (FLEXERIL) 10 MG tablet Take 1 tablet (10 mg total) by mouth 3 (three) times daily as needed. For muscle spasms  . DULoxetine (CYMBALTA) 20 MG capsule TAKE 2 CAPSULE BY MOUTH EVERY DAY  . glucose blood test strip Use as instructed once a day.  Dx Code: E11.9  . hydrocortisone-pramoxine (ANALPRAM HC) 2.5-1 % rectal cream Place 1 application rectally 3 (three) times daily. (Patient taking differently: Place 1 application rectally as needed. )  . Lancets MISC Use as directed once a day.  Dx code: E11.9  . lithium carbonate 300 MG capsule TK 2 CS HS  . nystatin cream (MYCOSTATIN) Apply 1 application topically 2 (two) times daily.  . propranolol (INDERAL) 10 MG tablet Take 10 mg by mouth 2 (two) times daily.  . Pyridoxine HCl (VITAMIN B-6 PO) Take 1 tablet by mouth daily.  . ranitidine (ZANTAC) 150 MG tablet Take 150 mg by mouth as needed.    No facility-administered encounter medications on file as of 06/17/2018.     Activities of Daily Living In your present state of health, do you have any difficulty performing the following activities: 06/17/2018  Hearing? N  Vision? N  Difficulty concentrating or making decisions? N  Walking or climbing stairs? N  Dressing or bathing? N  Doing errands, shopping? N  Preparing Food and eating ? N   Using the Toilet? N  In the past six months, have you accidently leaked urine? N  Do you have problems with loss of bowel control? N  Managing your Medications? N  Managing your Finances? N  Housekeeping or managing your Housekeeping? N  Some recent data might be hidden  Patient Care Team: Carollee Herter, Alferd Apa, DO as PCP - General (Family Medicine) Luz Brazen as Physician Assistant (Physician Assistant) Milus Banister, MD as Attending Physician (Gastroenterology)    Assessment:   This is a routine wellness examination for Megan Duncan. Physical assessment deferred to PCP.  Exercise Activities and Dietary recommendations Current Exercise Habits: The patient does not participate in regular exercise at present, Exercise limited by: None identified Diet (meal preparation, eat out, water intake, caffeinated beverages, dairy products, fruits and vegetables): in general, a "healthy" diet  , well balanced  Goals    . Continue eating a healthy diet    . Increase physical activity       Fall Risk Fall Risk  06/17/2018 01/10/2017 10/18/2015 08/01/2015 07/15/2014  Falls in the past year? No No No No No    Depression Screen PHQ 2/9 Scores 06/17/2018 01/10/2017 10/18/2015 08/01/2015  PHQ - 2 Score 1 6 0 0  PHQ- 9 Score - 16 - -  Exception Documentation - - Patient refusal -     Cognitive Function Ad8 score reviewed for issues:  Issues making decisions:no  Less interest in hobbies / activities:no  Repeats questions, stories (family complaining):no  Trouble using ordinary gadgets (microwave, computer, phone):no  Forgets the month or year: no  Mismanaging finances: no  Remembering appts:no  Daily problems with thinking and/or memory:no Ad8 score is=0     MMSE - Mini Mental State Exam 01/10/2017  Orientation to time 5  Orientation to Place 5  Registration 3  Attention/ Calculation 4  Recall 3  Language- name 2 objects 2  Language- repeat 1  Language- follow 3 step  command 3  Language- read & follow direction 1  Write a sentence 1  Copy design 1  Total score 29        Immunization History  Administered Date(s) Administered  . Influenza Whole 08/17/2009, 08/09/2010, 09/24/2012, 08/11/2013  . Influenza, High Dose Seasonal PF 09/12/2016, 08/30/2017  . Influenza,inj,Quad PF,6+ Mos 07/15/2014, 08/01/2015  . Pneumococcal Conjugate-13 07/15/2014  . Pneumococcal Polysaccharide-23 08/01/2015  . Zoster 09/24/2012    Screening Tests Health Maintenance  Topic Date Due  . OPHTHALMOLOGY EXAM  05/05/1956  . URINE MICROALBUMIN  05/05/1956  . TETANUS/TDAP  05/05/1965  . COLONOSCOPY  07/17/2015  . MAMMOGRAM  01/31/2018  . INFLUENZA VACCINE  06/12/2018  . HEMOGLOBIN A1C  10/04/2018  . FOOT EXAM  06/06/2019  . DEXA SCAN  Completed  . Hepatitis C Screening  Completed  . PNA vac Low Risk Adult  Completed       Plan:    Please schedule your next medicare wellness visit with me in 1 yr.  Continue to eat heart healthy diet (full of fruits, vegetables, whole grains, lean protein, water--limit salt, fat, and sugar intake) and increase physical activity as tolerated.  Continue doing brain stimulating activities (puzzles, reading, adult coloring books, staying active) to keep memory sharp.   I have ordered your mammogram. Please schedule.  Please call to schedule your colonoscopy. [phone]  I have personally reviewed and noted the following in the patient's chart:   . Medical and social history . Use of alcohol, tobacco or illicit drugs  . Current medications and supplements . Functional ability and status . Nutritional status . Physical activity . Advanced directives . List of other physicians . Hospitalizations, surgeries, and ER visits in previous 12 months . Vitals . Screenings to include cognitive, depression, and falls . Referrals and appointments  In addition, I  have reviewed and discussed with patient certain preventive protocols,  quality metrics, and best practice recommendations. A written personalized care plan for preventive services as well as general preventive health recommendations were provided to patient.     Shela Nevin, [place]  06/17/2018

## 2018-06-17 ENCOUNTER — Ambulatory Visit (INDEPENDENT_AMBULATORY_CARE_PROVIDER_SITE_OTHER): Payer: Medicare Other | Admitting: *Deleted

## 2018-06-17 ENCOUNTER — Encounter: Payer: Self-pay | Admitting: *Deleted

## 2018-06-17 VITALS — BP 142/68 | HR 60 | Ht 62.0 in | Wt 199.6 lb

## 2018-06-17 DIAGNOSIS — Z0000 Encounter for general adult medical examination without abnormal findings: Secondary | ICD-10-CM

## 2018-06-17 DIAGNOSIS — Z1239 Encounter for other screening for malignant neoplasm of breast: Secondary | ICD-10-CM

## 2018-06-17 DIAGNOSIS — Z1231 Encounter for screening mammogram for malignant neoplasm of breast: Secondary | ICD-10-CM

## 2018-06-17 NOTE — Patient Instructions (Signed)
Please schedule your next medicare wellness visit with me in 1 yr.  Continue to eat heart healthy diet (full of fruits, vegetables, whole grains, lean protein, water--limit salt, fat, and sugar intake) and increase physical activity as tolerated.  Continue doing brain stimulating activities (puzzles, reading, adult coloring books, staying active) to keep memory sharp.   I have ordered your mammogram. Please schedule.  Please call to schedule your colonoscopy. [phone]   [name] , Thank you for taking time to come for your Medicare Wellness Visit. I appreciate your ongoing commitment to your health goals. Please review the following plan we discussed and let me know if I can assist you in the future.   These are the goals we discussed: Goals    . Continue eating a healthy diet    . Increase physical activity       This is a list of the screening recommended for you and due dates:  Health Maintenance  Topic Date Due  . Eye exam for diabetics  05/05/1956  . Urine Protein Check  05/05/1956  . Tetanus Vaccine  05/05/1965  . Colon Cancer Screening  07/17/2015  . Mammogram  01/31/2018  . Flu Shot  06/12/2018  . Hemoglobin A1C  10/04/2018  . Complete foot exam   06/06/2019  . DEXA scan (bone density measurement)  Completed  .  Hepatitis C: One time screening is recommended by Center for Disease Control  (CDC) for  adults born from [birth year] through 1965.   Completed  . Pneumonia vaccines  Completed    Health Maintenance for Postmenopausal Women Menopause is a normal process in which your reproductive ability comes to an end. This process happens gradually over a span of months to years, usually between the ages of 66 and 67. Menopause is complete when you have missed 12 consecutive menstrual periods. It is important to talk with your health care provider about some of the most common conditions that affect postmenopausal women, such as heart disease, cancer, and bone loss  (osteoporosis). Adopting a healthy lifestyle and getting preventive care can help to promote your health and wellness. Those actions can also lower your chances of developing some of these common conditions. What should I know about menopause? During menopause, you may experience a number of symptoms, such as:  Moderate-to-severe hot flashes.  Night sweats.  Decrease in sex drive.  Mood swings.  Headaches.  Tiredness.  Irritability.  Memory problems.  Insomnia.  Choosing to treat or not to treat menopausal changes is an individual decision that you make with your health care provider. What should I know about hormone replacement therapy and supplements? Hormone therapy products are effective for treating symptoms that are associated with menopause, such as hot flashes and night sweats. Hormone replacement carries certain risks, especially as you become older. If you are thinking about using estrogen or estrogen with progestin treatments, discuss the benefits and risks with your health care provider. What should I know about heart disease and stroke? Heart disease, heart attack, and stroke become more likely as you age. This may be due, in part, to the hormonal changes that your body experiences during menopause. These can affect how your body processes dietary fats, triglycerides, and cholesterol. Heart attack and stroke are both medical emergencies. There are many things that you can do to help prevent heart disease and stroke:  Have your blood pressure checked at least every 1-2 years. High blood pressure causes heart disease and increases the risk of stroke.  If you are 49-66 years old, ask your health care provider if you should take aspirin to prevent a heart attack or a stroke.  Do not use any tobacco products, including cigarettes, chewing tobacco, or electronic cigarettes. If you need help quitting, ask your health care provider.  It is important to eat a healthy diet and  maintain a healthy weight. ? Be sure to include plenty of vegetables, fruits, low-fat dairy products, and lean protein. ? Avoid eating foods that are high in solid fats, added sugars, or salt (sodium).  Get regular exercise. This is one of the most important things that you can do for your health. ? Try to exercise for at least 150 minutes each week. The type of exercise that you do should increase your heart rate and make you sweat. This is known as moderate-intensity exercise. ? Try to do strengthening exercises at least twice each week. Do these in addition to the moderate-intensity exercise.  Know your numbers.Ask your health care provider to check your cholesterol and your blood glucose. Continue to have your blood tested as directed by your health care provider.  What should I know about cancer screening? There are several types of cancer. Take the following steps to reduce your risk and to catch any cancer development as early as possible. Breast Cancer  Practice breast self-awareness. ? This means understanding how your breasts normally appear and feel. ? It also means doing regular breast self-exams. Let your health care provider know about any changes, no matter how small.  If you are 72 or older, have a clinician do a breast exam (clinical breast exam or CBE) every year. Depending on your age, family history, and medical history, it may be recommended that you also have a yearly breast X-ray (mammogram).  If you have a family history of breast cancer, talk with your health care provider about genetic screening.  If you are at high risk for breast cancer, talk with your health care provider about having an MRI and a mammogram every year.  Breast cancer (BRCA) gene test is recommended for women who have family members with BRCA-related cancers. Results of the assessment will determine the need for genetic counseling and BRCA1 and for BRCA2 testing. BRCA-related cancers include these  types: ? Breast. This occurs in males or females. ? Ovarian. ? Tubal. This may also be called fallopian tube cancer. ? Cancer of the abdominal or pelvic lining (peritoneal cancer). ? Prostate. ? Pancreatic.  Cervical, Uterine, and Ovarian Cancer Your health care provider may recommend that you be screened regularly for cancer of the pelvic organs. These include your ovaries, uterus, and vagina. This screening involves a pelvic exam, which includes checking for microscopic changes to the surface of your cervix (Pap test).  For women ages 21-65, health care providers may recommend a pelvic exam and a Pap test every three years. For women ages 68-65, they may recommend the Pap test and pelvic exam, combined with testing for human papilloma virus (HPV), every five years. Some types of HPV increase your risk of cervical cancer. Testing for HPV may also be done on women of any age who have unclear Pap test results.  Other health care providers may not recommend any screening for nonpregnant women who are considered low risk for pelvic cancer and have no symptoms. Ask your health care provider if a screening pelvic exam is right for you.  If you have had past treatment for cervical cancer or a condition that could  lead to cancer, you need Pap tests and screening for cancer for at least 20 years after your treatment. If Pap tests have been discontinued for you, your risk factors (such as having a new sexual partner) need to be reassessed to determine if you should start having screenings again. Some women have medical problems that increase the chance of getting cervical cancer. In these cases, your health care provider may recommend that you have screening and Pap tests more often.  If you have a family history of uterine cancer or ovarian cancer, talk with your health care provider about genetic screening.  If you have vaginal bleeding after reaching menopause, tell your health care provider.  There  are currently no reliable tests available to screen for ovarian cancer.  Lung Cancer Lung cancer screening is recommended for adults 60-25 years old who are at high risk for lung cancer because of a history of smoking. A yearly low-dose CT scan of the lungs is recommended if you:  Currently smoke.  Have a history of at least 30 pack-years of smoking and you currently smoke or have quit within the past 15 years. A pack-year is smoking an average of one pack of cigarettes per day for one year.  Yearly screening should:  Continue until it has been 15 years since you quit.  Stop if you develop a health problem that would prevent you from having lung cancer treatment.  Colorectal Cancer  This type of cancer can be detected and can often be prevented.  Routine colorectal cancer screening usually begins at age 43 and continues through age 45.  If you have risk factors for colon cancer, your health care provider may recommend that you be screened at an earlier age.  If you have a family history of colorectal cancer, talk with your health care provider about genetic screening.  Your health care provider may also recommend using home test kits to check for hidden blood in your stool.  A small camera at the end of a tube can be used to examine your colon directly (sigmoidoscopy or colonoscopy). This is done to check for the earliest forms of colorectal cancer.  Direct examination of the colon should be repeated every 5-10 years until age 70. However, if early forms of precancerous polyps or small growths are found or if you have a family history or genetic risk for colorectal cancer, you may need to be screened more often.  Skin Cancer  Check your skin from head to toe regularly.  Monitor any moles. Be sure to tell your health care provider: ? About any new moles or changes in moles, especially if there is a change in a mole's shape or color. ? If you have a mole that is larger than the  size of a pencil eraser.  If any of your family members has a history of skin cancer, especially at a young age, talk with your health care provider about genetic screening.  Always use sunscreen. Apply sunscreen liberally and repeatedly throughout the day.  Whenever you are outside, protect yourself by wearing long sleeves, pants, a wide-brimmed hat, and sunglasses.  What should I know about osteoporosis? Osteoporosis is a condition in which bone destruction happens more quickly than new bone creation. After menopause, you may be at an increased risk for osteoporosis. To help prevent osteoporosis or the bone fractures that can happen because of osteoporosis, the following is recommended:  If you are 42-49 years old, get at least 1,000 mg of  calcium and at least 600 mg of vitamin D per day.  If you are older than age 58 but younger than age 31, get at least 1,200 mg of calcium and at least 600 mg of vitamin D per day.  If you are older than age 39, get at least 1,200 mg of calcium and at least 800 mg of vitamin D per day.  Smoking and excessive alcohol intake increase the risk of osteoporosis. Eat foods that are rich in calcium and vitamin D, and do weight-bearing exercises several times each week as directed by your health care provider. What should I know about how menopause affects my mental health? Depression may occur at any age, but it is more common as you become older. Common symptoms of depression include:  Low or sad mood.  Changes in sleep patterns.  Changes in appetite or eating patterns.  Feeling an overall lack of motivation or enjoyment of activities that you previously enjoyed.  Frequent crying spells.  Talk with your health care provider if you think that you are experiencing depression. What should I know about immunizations? It is important that you get and maintain your immunizations. These include:  Tetanus, diphtheria, and pertussis (Tdap) booster  vaccine.  Influenza every year before the flu season begins.  Pneumonia vaccine.  Shingles vaccine.  Your health care provider may also recommend other immunizations. This information is not intended to replace advice given to you by your health care provider. Make sure you discuss any questions you have with your health care provider. Document Released: 12/21/2005 Document Revised: 05/18/2016 Document Reviewed: 08/02/2015 Elsevier Interactive Patient Education  2018 [employer].

## 2018-06-17 NOTE — Progress Notes (Signed)
Reviewed  Srihari Shellhammer R Lowne Chase, DO  

## 2018-06-25 ENCOUNTER — Ambulatory Visit (INDEPENDENT_AMBULATORY_CARE_PROVIDER_SITE_OTHER): Payer: Self-pay | Admitting: Family Medicine

## 2018-07-01 ENCOUNTER — Ambulatory Visit (HOSPITAL_BASED_OUTPATIENT_CLINIC_OR_DEPARTMENT_OTHER)
Admission: RE | Admit: 2018-07-01 | Discharge: 2018-07-01 | Disposition: A | Discharge status: Home / Self Care | Payer: Medicare Other | Source: Ambulatory Visit | Attending: Family Medicine | Admitting: Family Medicine

## 2018-07-01 DIAGNOSIS — Z1239 Encounter for other screening for malignant neoplasm of breast: Secondary | ICD-10-CM

## 2018-07-01 DIAGNOSIS — Z1231 Encounter for screening mammogram for malignant neoplasm of breast: Secondary | ICD-10-CM | POA: Diagnosis not present

## 2018-07-08 ENCOUNTER — Other Ambulatory Visit (INDEPENDENT_AMBULATORY_CARE_PROVIDER_SITE_OTHER): Payer: Medicare Other

## 2018-07-08 DIAGNOSIS — E1151 Type 2 diabetes mellitus with diabetic peripheral angiopathy without gangrene: Secondary | ICD-10-CM | POA: Diagnosis not present

## 2018-07-08 DIAGNOSIS — I10 Essential (primary) hypertension: Secondary | ICD-10-CM | POA: Diagnosis not present

## 2018-07-08 DIAGNOSIS — E1165 Type 2 diabetes mellitus with hyperglycemia: Secondary | ICD-10-CM

## 2018-07-08 DIAGNOSIS — IMO0002 Reserved for concepts with insufficient information to code with codable children: Secondary | ICD-10-CM

## 2018-07-08 LAB — COMPREHENSIVE METABOLIC PANEL
ALK PHOS: 67 U/L (ref 39–117)
ALT: 12 U/L (ref 0–35)
AST: 16 U/L (ref 0–37)
Albumin: 4.5 g/dL (ref 3.5–5.2)
BILIRUBIN TOTAL: 0.8 mg/dL (ref 0.2–1.2)
BUN: 14 mg/dL (ref 6–23)
CALCIUM: 10.3 mg/dL (ref 8.4–10.5)
CO2: 30 mEq/L (ref 19–32)
Chloride: 106 mEq/L (ref 96–112)
Creatinine, Ser: 0.95 mg/dL (ref 0.40–1.20)
GFR: 61.43 mL/min (ref >60.00)
Glucose, Bld: 159 mg/dL — ABNORMAL HIGH (ref 70–99)
POTASSIUM: 5.3 meq/L — AB (ref 3.5–5.1)
Sodium: 142 mEq/L (ref 135–145)
Total Protein: 7 g/dL (ref 6.0–8.3)

## 2018-07-08 LAB — LIPID PANEL
CHOLESTEROL: 179 mg/dL (ref 0–200)
HDL: 63.3 mg/dL (ref >39.00)
LDL CALC: 97 mg/dL (ref 0–99)
NonHDL: 115.68
Total CHOL/HDL Ratio: 3
Triglycerides: 92 mg/dL (ref 0.0–149.0)
VLDL: 18.4 mg/dL (ref 0.0–40.0)

## 2018-07-08 LAB — MICROALBUMIN / CREATININE URINE RATIO
CREATININE, U: 170.2 mg/dL
MICROALB UR: 1 mg/dL (ref 0.0–1.9)
MICROALB/CREAT RATIO: 0.6 mg/g (ref 0.0–30.0)

## 2018-07-08 LAB — HEMOGLOBIN A1C: Hgb A1c MFr Bld: 6 % (ref 4.6–6.5)

## 2018-07-09 ENCOUNTER — Telehealth: Payer: Self-pay | Admitting: Emergency Medicine

## 2018-07-09 ENCOUNTER — Ambulatory Visit (INDEPENDENT_AMBULATORY_CARE_PROVIDER_SITE_OTHER): Payer: Medicare Other | Admitting: Acute Care

## 2018-07-09 ENCOUNTER — Ambulatory Visit (INDEPENDENT_AMBULATORY_CARE_PROVIDER_SITE_OTHER)
Admission: RE | Admit: 2018-07-09 | Discharge: 2018-07-09 | Disposition: A | Discharge status: Home / Self Care | Payer: Medicare Other | Source: Ambulatory Visit | Attending: Acute Care | Admitting: Acute Care

## 2018-07-09 ENCOUNTER — Encounter: Payer: Self-pay | Admitting: Acute Care

## 2018-07-09 DIAGNOSIS — Z87891 Personal history of nicotine dependence: Secondary | ICD-10-CM

## 2018-07-09 DIAGNOSIS — Z122 Encounter for screening for malignant neoplasm of respiratory organs: Secondary | ICD-10-CM

## 2018-07-09 MED ORDER — ALBUTEROL SULFATE HFA 108 (90 BASE) MCG/ACT IN AERS: 2.0000 | INHALATION_SPRAY | RESPIRATORY_TRACT | 3 refills | Status: DC | PRN

## 2018-07-09 NOTE — Telephone Encounter (Signed)
Called and spoke with patient, she was needing a new inhaler due to the other breaking. Medication refill sent. Nothing further needed.

## 2018-07-09 NOTE — Progress Notes (Signed)
Shared Decision Making Visit Lung Cancer Screening Program [phone])   Eligibility:  Age 72 y.o.  Pack Years Smoking History Calculation 63 pack year smoking history (# packs/per year x # years smoked)  Recent History of coughing up blood  no  Unexplained weight loss? no ( >Than 15 pounds within the last 6 months )  Prior History Lung / other cancer no (Diagnosis within the last 5 years already requiring surveillance chest CT Scans).  Smoking Status Former Smoker  Former Smokers: Years since quit: 14 years  Quit Date: 2005  Visit Components:  Discussion included one or more decision making aids. yes  Discussion included risk/benefits of screening. yes  Discussion included potential follow up diagnostic testing for abnormal scans. yes  Discussion included meaning and risk of over diagnosis. yes  Discussion included meaning and risk of False Positives. yes  Discussion included meaning of total radiation exposure. yes  Counseling Included:  Importance of adherence to annual lung cancer LDCT screening. yes  Impact of comorbidities on ability to participate in the program. yes  Ability and willingness to under diagnostic treatment. yes  Smoking Cessation Counseling:  Current Smokers:   Discussed importance of smoking cessation. yes  Information about tobacco cessation classes and interventions provided to patient. yes  Patient provided with "ticket" for LDCT Scan. yes  Symptomatic Patient. no  Counseling  Diagnosis Code: Tobacco Use Z72.0  Asymptomatic Patient yes  Counseling (Intermediate counseling: > three minutes counseling) [id number]  Former Smokers:   Discussed the importance of maintaining cigarette abstinence. yes  Diagnosis Code: Personal History of Nicotine Dependence. J28.786  Information about tobacco cessation classes and interventions provided to patient. Yes  Patient provided with "ticket" for LDCT Scan. yes  Written Order for Lung Cancer  Screening with LDCT placed in Epic. Yes (CT Chest Lung Cancer Screening Low Dose W/O CM) [id number] Z12.2-Screening of respiratory organs Z87.891-Personal history of nicotine dependence  I spent 25 minutes of face to face time with Ms. Fross discussing the risks and benefits of lung cancer screening. We viewed a power point together that explained in detail the above noted topics. We took the time to pause the power point at intervals to allow for questions to be asked and answered to ensure understanding. We discussed that she had taken the single most powerful action possible to decrease her risk of developing lung cancer when she quit smoking. I counseled her to remain smoke free, and to contact me if she ever had the desire to smoke again so that I can provide resources and tools to help support the effort to remain smoke free. We discussed the time and location of the scan, and that either  Doroteo Glassman RN or I will call with the results within  24-48 hours of receiving them. She has my card and contact information in the event she needs to speak with me, in addition to a copy of the power point we reviewed as a resource. She verbalized understanding of all of the above and had no further questions upon leaving the office.     I explained to the patient that there has been a high incidence of coronary artery disease noted on these exams. I explained that this is a non-gated exam therefore degree or severity cannot be determined. This patient is not on statin therapy. I have asked the patient to follow-up with their PCP regarding any incidental finding of coronary artery disease and management with diet or medication as they feel is clinically  indicated. The patient verbalized understanding of the above and had no further questions.     Magdalen Spatz, NP 07/09/2018 10:32 AM

## 2018-07-11 DIAGNOSIS — E7849 Other hyperlipidemia: Secondary | ICD-10-CM

## 2018-07-11 DIAGNOSIS — E119 Type 2 diabetes mellitus without complications: Secondary | ICD-10-CM

## 2018-07-11 DIAGNOSIS — I10 Essential (primary) hypertension: Secondary | ICD-10-CM

## 2018-07-14 ENCOUNTER — Other Ambulatory Visit: Payer: Self-pay | Admitting: Family Medicine

## 2018-07-18 ENCOUNTER — Other Ambulatory Visit: Payer: Self-pay | Admitting: Acute Care

## 2018-07-18 ENCOUNTER — Other Ambulatory Visit: Payer: Self-pay | Admitting: Family Medicine

## 2018-07-18 DIAGNOSIS — Z122 Encounter for screening for malignant neoplasm of respiratory organs: Secondary | ICD-10-CM

## 2018-07-18 DIAGNOSIS — Z87891 Personal history of nicotine dependence: Secondary | ICD-10-CM

## 2018-08-12 ENCOUNTER — Encounter (INDEPENDENT_AMBULATORY_CARE_PROVIDER_SITE_OTHER): Payer: Self-pay

## 2018-08-18 ENCOUNTER — Other Ambulatory Visit: Payer: Self-pay

## 2018-08-18 ENCOUNTER — Telehealth: Payer: Self-pay | Admitting: Psychiatry

## 2018-08-18 MED ORDER — PROPRANOLOL HCL 10 MG PO TABS: 10.0000 mg | ORAL_TABLET | Freq: Two times a day (BID) | ORAL | 0 refills | Status: DC

## 2018-08-18 NOTE — Telephone Encounter (Signed)
Pt need refill on Propranolol to be sent to [facility] [phone], pt has enough meds for 1 week

## 2018-08-18 NOTE — Telephone Encounter (Signed)
[facility] 

## 2018-08-20 ENCOUNTER — Other Ambulatory Visit: Payer: Self-pay | Admitting: *Deleted

## 2018-08-20 ENCOUNTER — Other Ambulatory Visit: Payer: Self-pay | Admitting: Family Medicine

## 2018-08-20 MED ORDER — AMLODIPINE BESYLATE 5 MG PO TABS: ORAL_TABLET | ORAL | 0 refills | Status: DC

## 2018-08-20 NOTE — Telephone Encounter (Signed)
Requested Prescriptions  Pending Prescriptions Disp Refills  . amLODipine (NORVASC) 5 MG tablet 15 tablet 0    Sig: TAKE 1 TABLET EVERY DAY  (REPLACES LISINOPRIL/HCTZ)     Cardiovascular:  Calcium Channel Blockers Failed - 08/20/2018 10:55 AM      Failed - Last BP in normal range    BP Readings from Last 1 Encounters:  06/17/18 (!) 142/68         Passed - Valid encounter within last 6 months    Recent Outpatient Visits          2 months ago DM (diabetes mellitus) type II uncontrolled, periph vascular disorder ([place])   Archivist at [place], DO   4 months ago Abnormal thyroid function test   [facility] at [place], DO   11 months ago Advice worker at [place], [place]   11 months ago Essential hypertension   Archivist at [place], DO   1 year ago Cough   Archivist at [place], DO      Future Appointments            In 3 months [place], [place] at [facility], [place]   In 10 months [place], [place], Research scientist (physical sciences) at [facility], [place]

## 2018-08-20 NOTE — Telephone Encounter (Signed)
Copied from [place] [id number]. Topic: Quick Communication - Rx Refill/Question >> Aug 20, 2018 10:42 AM Scherrie Gerlach wrote: Medication:  amLODipine (NORVASC) 5 MG tablet Pt states she did not know she was out!  This comes from mail order, but pt will need a 15 day sent to local pharmacy. [id number] [address], [address] AT [facility] OF [address] [phone] (Phone) [phone] (Fax)  [phone] day [place], [place] [phone] (Phone) [phone] (Fax

## 2018-08-20 NOTE — Progress Notes (Signed)
Requested Prescriptions  Pending Prescriptions Disp Refills  . amLODipine (NORVASC) 5 MG tablet 90 tablet 0    Sig: TAKE 1 TABLET EVERY DAY  (REPLACES LISINOPRIL/HCTZ)     There is no refill protocol information for this order

## 2018-08-27 ENCOUNTER — Ambulatory Visit (INDEPENDENT_AMBULATORY_CARE_PROVIDER_SITE_OTHER): Payer: Self-pay | Admitting: Bariatrics

## 2018-09-01 ENCOUNTER — Ambulatory Visit (INDEPENDENT_AMBULATORY_CARE_PROVIDER_SITE_OTHER): Payer: Medicare Other | Admitting: Psychiatry

## 2018-09-01 DIAGNOSIS — F411 Generalized anxiety disorder: Secondary | ICD-10-CM

## 2018-09-01 DIAGNOSIS — F419 Anxiety disorder, unspecified: Secondary | ICD-10-CM

## 2018-09-01 DIAGNOSIS — F32A Depression, unspecified: Secondary | ICD-10-CM

## 2018-09-01 DIAGNOSIS — F329 Major depressive disorder, single episode, unspecified: Secondary | ICD-10-CM | POA: Diagnosis not present

## 2018-09-01 DIAGNOSIS — F3181 Bipolar II disorder: Secondary | ICD-10-CM | POA: Diagnosis not present

## 2018-09-01 HISTORY — DX: Generalized anxiety disorder: F41.1

## 2018-09-01 MED ORDER — DULOXETINE HCL 60 MG PO CPEP: 60.0000 mg | ORAL_CAPSULE | Freq: Every morning | ORAL | 0 refills | Status: DC

## 2018-09-01 MED ORDER — CLORAZEPATE DIPOTASSIUM 3.75 MG PO TABS: 3.7500 mg | ORAL_TABLET | Freq: Two times a day (BID) | ORAL | 1 refills | Status: DC | PRN

## 2018-09-01 MED ORDER — PROPRANOLOL HCL 10 MG PO TABS: 10.0000 mg | ORAL_TABLET | Freq: Two times a day (BID) | ORAL | 0 refills | Status: DC

## 2018-09-01 MED ORDER — LITHIUM CARBONATE 300 MG PO CAPS: 600.0000 mg | ORAL_CAPSULE | Freq: Two times a day (BID) | ORAL | 1 refills | Status: DC

## 2018-09-01 NOTE — Progress Notes (Signed)
[facility]  Patient ID: Megan Duncan,  MRN: [id number]  PCP: Ann Held, DO  Date of Evaluation: 09/01/2018 Time spent:20 minutes   HISTORY/CURRENT STATUS: HPI patient last seen 06/09/2018 was having some depression but denied manic symptoms or anxiety.  No Change in her medication and she was to call if she had any mood swings.  Diagnoses include bipolar 2 anxiety and depression.  Continues with fatigue . Depression resolved.   Individual Medical History/ Review of Systems: Changes? :No  Allergies: Atorvastatin; Codeine; Diclofenac; Doxycycline; and Sulfonamide derivatives  Current Medications:  Current Outpatient Medications:  .  albuterol (VENTOLIN HFA) 108 (90 Base) MCG/ACT inhaler, Inhale 2 puffs into the lungs every 4 (four) hours as needed. For shortness of breath and wheezing, Disp: 18 g, Rfl: 3 .  amLODipine (NORVASC) 5 MG tablet, TAKE 1 TABLET EVERY DAY  (REPLACES LISINOPRIL/HCTZ), Disp: 90 tablet, Rfl: 0 .  benzonatate (TESSALON) 100 MG capsule, Take 1 capsule (100 mg total) by mouth 3 (three) times daily as needed., Disp: 21 capsule, Rfl: 0 .  blood glucose meter kit and supplies, Dispense based on patient and insurance preference. Use once a day.  DX Code: E11.9, Disp: 1 each, Rfl: 0 .  clorazepate (TRANXENE) 3.75 MG tablet, Take 1 tablet (3.75 mg total) by mouth 2 (two) times daily as needed for anxiety., Disp: 90 tablet, Rfl: 1 .  cyclobenzaprine (FLEXERIL) 10 MG tablet, Take 1 tablet (10 mg total) by mouth 3 (three) times daily as needed. For muscle spasms, Disp: 30 tablet, Rfl: 0 .  DULoxetine (CYMBALTA) 20 MG capsule, TAKE 2 CAPSULE BY MOUTH EVERY DAY, Disp: 60 capsule, Rfl: 0 .  DULoxetine (CYMBALTA) 60 MG capsule, Take 60 mg by mouth daily., Disp: , Rfl:  .  glucose blood test strip, Use as instructed once a day.  Dx Code: E11.9, Disp: 100 each, Rfl: 12 .  hydrocortisone-pramoxine (ANALPRAM HC) 2.5-1 % rectal cream, Place 1 application  rectally 3 (three) times daily. (Patient taking differently: Place 1 application rectally as needed. ), Disp: 30 g, Rfl: 3 .  Lancets MISC, Use as directed once a day.  Dx code: E11.9, Disp: 100 each, Rfl: 1 .  lithium carbonate 300 MG capsule, TK 2 CS HS, Disp: , Rfl: 1 .  metFORMIN (GLUCOPHAGE) 500 MG tablet, TAKE 1 TABLET BY MOUTH TWICE DAILY, Disp: 180 tablet, Rfl: 0 .  nystatin cream (MYCOSTATIN), Apply 1 application topically 2 (two) times daily., Disp: 30 g, Rfl: 0 .  propranolol (INDERAL) 10 MG tablet, Take 1 tablet (10 mg total) by mouth 2 (two) times daily., Disp: 180 tablet, Rfl: 0 .  Pyridoxine HCl (VITAMIN B-6 PO), Take 1 tablet by mouth daily., Disp: , Rfl:  .  ranitidine (ZANTAC) 150 MG tablet, Take 150 mg by mouth as needed. , Disp: , Rfl:  Medication Side Effects: None  Family Medical/ Social History: Changes? No  MENTAL HEALTH EXAM:  There were no vitals taken for this visit.There is no height or weight on file to calculate BMI.  General Appearance: Casual overweight  Eye Contact:  Good  Speech:  Normal Rate  Volume:  Normal  Mood:  Euthymic  Affect:  Appropriate  Thought Process:  Linear  Orientation:  Full (Time, Place, and Person)  Thought Content: Logical   Suicidal Thoughts:  No  Homicidal Thoughts:  No  Memory:  normal  Judgement:  Good  Insight:  Good  Psychomotor Activity:  Normal  Concentration:  Concentration:  Good  Recall:  Good  Fund of Knowledge: Good  Language: Good  Akathisia:  NA  AIMS (if indicated): na  Assets:  Leisure Time  ADL's:  Intact  Cognition: WNL  Prognosis:  Good    DIAGNOSES: bipolar  Anxiety    depression  RECOMMENDATION: meds sent through [facility] in. Recheck 2 months. Consider fatigue labs. Make sure pcp knows she's on norvasc and propranolol    Comer Locket, PA-C

## 2018-09-03 ENCOUNTER — Other Ambulatory Visit: Payer: Self-pay | Admitting: Family Medicine

## 2018-09-23 ENCOUNTER — Encounter: Payer: Self-pay | Admitting: Family Medicine

## 2018-09-23 ENCOUNTER — Ambulatory Visit (INDEPENDENT_AMBULATORY_CARE_PROVIDER_SITE_OTHER): Payer: Medicare Other | Admitting: Family Medicine

## 2018-09-23 VITALS — BP 134/41 | HR 68 | Temp 98.2°F | Resp 16 | Ht 62.0 in | Wt 207.0 lb

## 2018-09-23 DIAGNOSIS — M25511 Pain in right shoulder: Secondary | ICD-10-CM

## 2018-09-23 DIAGNOSIS — Z23 Encounter for immunization: Secondary | ICD-10-CM

## 2018-09-23 MED ORDER — PREDNISONE 10 MG PO TABS: ORAL_TABLET | ORAL | 0 refills | Status: DC

## 2018-09-23 MED ORDER — METHOCARBAMOL 500 MG PO TABS: 500.0000 mg | ORAL_TABLET | Freq: Four times a day (QID) | ORAL | 1 refills | Status: DC

## 2018-09-23 NOTE — Progress Notes (Signed)
Patient ID: Megan Duncan, female    DOB: [birth year]  Age: 72 y.o. MRN: [id number]    Subjective:  Subjective  HPI ELBERT POLYAKOV presents for R shoulder pain x 2 weeks.  No known injury.  Pain feels like toothache in shoulder and upper arm    Review of Systems  Constitutional: Negative for appetite change, diaphoresis, fatigue and unexpected weight change.  Eyes: Negative for pain, redness and visual disturbance.  Respiratory: Negative for cough, chest tightness, shortness of breath and wheezing.   Cardiovascular: Negative for chest pain, palpitations and leg swelling.  Endocrine: Negative for cold intolerance, heat intolerance, polydipsia, polyphagia and polyuria.  Genitourinary: Negative for difficulty urinating, dysuria and frequency.  Musculoskeletal: Positive for arthralgias and back pain.       Seeing ortho for back r shoulder pain   Neurological: Negative for dizziness, light-headedness, numbness and headaches.    History Past Medical History:  Diagnosis Date  . Anxiety   . Arthritis    hands  . Bipolar II disorder ([place])    Dr. Charlott Holler  . COPD (chronic obstructive pulmonary disease) ([place])   . Depression   . Diastolic dysfunction    Per pt, diagnosed after [facility]  . Diverticulosis   . Fibromyalgia   . Hyperlipidemia   . Hypertension   . LBP (low back pain)     She has a past surgical history that includes Appendectomy; Total abdominal hysterectomy; Tonsillectomy; and Knee arthroscopy (Right, 2008).   Her family history includes Alcoholism in her father; Breast cancer in her maternal aunt; Colon cancer (age of onset: 47) in her maternal aunt; Diabetes in her father; Heart attack in her father; Hiatal hernia in her mother; Hypertension in her father and mother; Mental illness in her sister.She reports that she quit smoking about 14 years ago. She has a 60.00 pack-year smoking history. She has never used smokeless tobacco. She reports that she drinks alcohol. She  reports that she does not use drugs.  Current Outpatient Medications on File Prior to Visit  Medication Sig Dispense Refill  . albuterol (VENTOLIN HFA) 108 (90 Base) MCG/ACT inhaler Inhale 2 puffs into the lungs every 4 (four) hours as needed. For shortness of breath and wheezing 18 g 3  . amLODipine (NORVASC) 5 MG tablet TAKE 1 TABLET BY MOUTH EVERY DAY 90 tablet 0  . blood glucose meter kit and supplies Dispense based on patient and insurance preference. Use once a day.  DX Code: E11.9 1 each 0  . clorazepate (TRANXENE) 3.75 MG tablet Take 1 tablet (3.75 mg total) by mouth 2 (two) times daily as needed for anxiety. 90 tablet 1  . cyclobenzaprine (FLEXERIL) 10 MG tablet Take 1 tablet (10 mg total) by mouth 3 (three) times daily as needed. For muscle spasms 30 tablet 0  . DULoxetine (CYMBALTA) 60 MG capsule Take 1 capsule (60 mg total) by mouth every morning. 90 capsule 0  . glucose blood test strip Use as instructed once a day.  Dx Code: E11.9 100 each 12  . hydrocortisone-pramoxine (ANALPRAM HC) 2.5-1 % rectal cream Place 1 application rectally 3 (three) times daily. (Patient taking differently: Place 1 application rectally as needed. ) 30 g 3  . Lancets MISC Use as directed once a day.  Dx code: E11.9 100 each 1  . lithium carbonate 300 MG capsule Take 2 capsules (600 mg total) by mouth 2 (two) times daily. 180 capsule 1  . nystatin cream (MYCOSTATIN) Apply 1 application topically 2 (  two) times daily. 30 g 0  . propranolol (INDERAL) 10 MG tablet Take 1 tablet (10 mg total) by mouth 2 (two) times daily. 180 tablet 0  . Pyridoxine HCl (VITAMIN B-6 PO) Take 1 tablet by mouth daily.    . ranitidine (ZANTAC) 150 MG tablet Take 150 mg by mouth as needed.      No current facility-administered medications on file prior to visit.      Objective:  Objective  Physical Exam  Constitutional: She is oriented to person, place, and time. She appears well-developed and well-nourished.  HENT:  Head:  Normocephalic and atraumatic.  Eyes: Conjunctivae and EOM are normal. Scleral icterus is present.  Neck: Normal range of motion. Neck supple. No JVD present. Carotid bruit is not present. No thyromegaly present.  Cardiovascular: Normal rate, regular rhythm and normal heart sounds.  No murmur heard. Pulmonary/Chest: Effort normal and breath sounds normal. No respiratory distress. She has no wheezes. She has no rales. She exhibits no tenderness.  Musculoskeletal: She exhibits no edema.       Right shoulder: She exhibits decreased range of motion, tenderness, pain and spasm.       Arms: Neurological: She is alert and oriented to person, place, and time.  Psychiatric: She has a normal mood and affect.  Nursing note and vitals reviewed.  BP (!) 134/41 (BP Location: Left Arm, Cuff Size: Large)   Pulse 68   Temp 98.2 F (36.8 C) (Oral)   Resp 16   Ht '5\' 2"'  (1.575 m)   Wt 207 lb (93.9 kg)   SpO2 97%   BMI 37.86 kg/m  Wt Readings from Last 3 Encounters:  09/23/18 207 lb (93.9 kg)  06/17/18 199 lb 9.6 oz (90.5 kg)  06/05/18 204 lb (92.5 kg)     Lab Results  Component Value Date   WBC 7.7 08/30/2017   HGB 15.1 08/30/2017   HCT 45.4 (H) 08/30/2017   PLT 393 08/30/2017   GLUCOSE 159 (H) 07/08/2018   CHOL 179 07/08/2018   TRIG 92.0 07/08/2018   HDL 63.30 07/08/2018   LDLDIRECT 120.1 12/03/2013   LDLCALC 97 07/08/2018   ALT 12 07/08/2018   AST 16 07/08/2018   NA 142 07/08/2018   K 5.3 (H) 07/08/2018   CL 106 07/08/2018   CREATININE 0.95 07/08/2018   BUN 14 07/08/2018   CO2 30 07/08/2018   TSH 2.99 04/03/2018   INR 1.0 10/25/2015   HGBA1C 6.0 07/08/2018   MICROALBUR 1.0 07/08/2018    Ct Chest Lung Ca Screen Low Dose W/o Cm  Result Date: 07/09/2018 CLINICAL DATA:  72 year old female former smoker (quit in 2005) with 63 pack-year history of smoking. Lung cancer screening examination. EXAM: CT CHEST WITHOUT CONTRAST LOW-DOSE FOR LUNG CANCER SCREENING TECHNIQUE: Multidetector CT  imaging of the chest was performed following the standard protocol without IV contrast. COMPARISON:  No priors. FINDINGS: Cardiovascular: Heart size is normal. There is no significant pericardial fluid, thickening or pericardial calcification. There is aortic atherosclerosis, as well as atherosclerosis of the great vessels of the mediastinum and the coronary arteries, including calcified atherosclerotic plaque in the left main, left anterior descending and left circumflex coronary arteries. Mediastinum/Nodes: No pathologically enlarged mediastinal or hilar lymph nodes. Please note that accurate exclusion of hilar adenopathy is limited on noncontrast CT scans. Esophagus is unremarkable in appearance. No axillary lymphadenopathy. Lungs/Pleura: Multiple small pulmonary nodules are noted throughout the lungs bilaterally, largest of which is in the periphery of the right lower lobe  near the base (axial image 259 of series 3), with a volume derived mean diameter of only 4.2 mm. No larger more suspicious appearing pulmonary nodules or masses are noted. No acute consolidative airspace disease. No pleural effusions. Mild diffuse bronchial wall thickening with mild centrilobular and paraseptal emphysema. Upper Abdomen: Aortic atherosclerosis. Mild diffuse low attenuation noted throughout the visualized portions of the hepatic parenchyma, indicative of hepatic steatosis. Musculoskeletal: There are no aggressive appearing lytic or blastic lesions noted in the visualized portions of the skeleton. IMPRESSION: 1. Lung-RADS 2S, benign appearance or behavior. Continue annual screening with low-dose chest CT without contrast in 12 months. 2. The "S" modifier above refers to potentially clinically significant non lung cancer related findings. Specifically, there is aortic atherosclerosis, in addition to left main and 2 vessel coronary artery disease. Assessment for potential risk factor modification, dietary therapy or pharmacologic  therapy may be warranted, if clinically indicated. 3. Mild diffuse bronchial wall thickening with mild centrilobular and paraseptal emphysema; imaging findings suggestive of underlying COPD. 4. Mild hepatic steatosis. Aortic Atherosclerosis (ICD10-I70.0) and Emphysema (ICD10-J43.9). Electronically Signed   By: Vinnie Langton M.D.   On: 07/09/2018 13:25     Assessment & Plan:  Plan  I have discontinued [name] "[name]"'s benzonatate, metFORMIN, and methocarbamol. I am also having her start on predniSONE. Additionally, I am having her maintain her cyclobenzaprine, hydrocortisone-pramoxine, ranitidine, Pyridoxine HCl (VITAMIN B-6 PO), nystatin cream, Lancets, glucose blood, blood glucose meter kit and supplies, albuterol, propranolol, DULoxetine, clorazepate, lithium carbonate, and amLODipine.  Meds ordered this encounter  Medications  . predniSONE (DELTASONE) 10 MG tablet    Sig: TAKE 3 TABLETS PO QD FOR 3 DAYS THEN TAKE 2 TABLETS PO QD FOR 3 DAYS THEN TAKE 1 TABLET PO QD FOR 3 DAYS THEN TAKE 1/2 TAB PO QD FOR 3 DAYS    Dispense:  20 tablet    Refill:  0  . DISCONTD: methocarbamol (ROBAXIN) 500 MG tablet    Sig: Take 1 tablet (500 mg total) by mouth 4 (four) times daily.    Dispense:  30 tablet    Refill:  1    Problem List Items Addressed This Visit      Unprioritized   Acute pain of right shoulder - Primary    pred taper--  Will get pt reg insulin for sliding scale if needed Refer to sport med vs ortho prn       Relevant Medications   predniSONE (DELTASONE) 10 MG tablet    Other Visit Diagnoses    Influenza vaccine administered       Relevant Orders   Flu vaccine HIGH DOSE PF (Fluzone High dose) (Completed)      Follow-up: Return if symptoms worsen or fail to improve.  Ann Held, DO

## 2018-09-23 NOTE — Patient Instructions (Signed)
Shoulder Pain Many things can cause shoulder pain, including:  An injury to the area.  Overuse of the shoulder.  Arthritis.  The source of the pain can be:  Inflammation.  An injury to the shoulder joint.  An injury to a tendon, ligament, or bone.  Follow these instructions at home: Take these actions to help with your pain:  Squeeze a soft ball or a foam pad as much as possible. This helps to keep the shoulder from swelling. It also helps to strengthen the arm.  Take over-the-counter and prescription medicines only as told by your health care provider.  If directed, apply ice to the area: ? Put ice in a plastic bag. ? Place a towel between your skin and the bag. ? Leave the ice on for 20 minutes, 2-3 times per day. Stop applying ice if it does not help with the pain.  If you were given a shoulder sling or immobilizer: ? Wear it as told. ? Remove it to shower or bathe. ? Move your arm as little as possible, but keep your hand moving to prevent swelling.  Contact a health care provider if:  Your pain gets worse.  Your pain is not relieved with medicines.  New pain develops in your arm, hand, or fingers. Get help right away if:  Your arm, hand, or fingers: ? Tingle. ? Become numb. ? Become swollen. ? Become painful. ? Turn white or blue. This information is not intended to replace advice given to you by your health care provider. Make sure you discuss any questions you have with your health care provider. Document Released: 08/08/2005 Document Revised: 06/24/2016 Document Reviewed: 02/21/2015 Elsevier Interactive Patient Education  2018 Elsevier Inc.  

## 2018-09-24 ENCOUNTER — Telehealth: Payer: Self-pay

## 2018-09-24 DIAGNOSIS — M25511 Pain in right shoulder: Secondary | ICD-10-CM

## 2018-09-24 HISTORY — DX: Pain in right shoulder: M25.511

## 2018-09-24 NOTE — Telephone Encounter (Signed)
I left message with rep and sheketia was going to call her today to try to get some insulin brought to office. Megan Duncan

## 2018-09-24 NOTE — Assessment & Plan Note (Signed)
pred taper--  Will get pt reg insulin for sliding scale if needed Refer to sport med vs ortho prn

## 2018-09-24 NOTE — Telephone Encounter (Signed)
Pt. calling to follow-up on getting supply of emergency insulin for pt. to take home while on prednisone per conversation with Dr. Etter Sjogren on 11/12. Pt. States her sugars have been WNL so far, (BG 112 this AM fasting) but is anxious if it goes above her normal high range of 150. Author reviewed s/s hyperglycemia, and stated she would route to Dr. Etter Sjogren to clarify insulin recommendations; will be in touch with pt. Tmr.

## 2018-09-29 ENCOUNTER — Telehealth: Payer: Self-pay | Admitting: Family Medicine

## 2018-09-29 DIAGNOSIS — M25519 Pain in unspecified shoulder: Secondary | ICD-10-CM

## 2018-09-29 NOTE — Telephone Encounter (Signed)
Referral placed.

## 2018-09-29 NOTE — Telephone Encounter (Signed)
Ok to refer to ortho 

## 2018-09-29 NOTE — Telephone Encounter (Signed)
Copied from [place] [id number]. Topic: Quick Communication - See Telephone Encounter >> Sep 29, 2018  8:37 AM Antonieta Iba C wrote: CRM for notification. See Telephone encounter for: 09/29/18.  Pt called in to be advised. Pt was seen for shoulder pain and prescribed a medication. Pt says that her shoulder is still in pain. Pt would like to know if she should see her ortho? Pt says that she sees [facility].   Please advise.   CB: [phone]

## 2018-09-30 NOTE — Telephone Encounter (Signed)
Pt called and stated that she does not need medication

## 2018-09-30 NOTE — Telephone Encounter (Signed)
Routed to [facility] to confirm status of pt. getting insulin sample.

## 2018-09-30 NOTE — Telephone Encounter (Signed)
Left message on machine to let us know if she still needs med.

## 2018-10-06 DIAGNOSIS — M47812 Spondylosis without myelopathy or radiculopathy, cervical region: Secondary | ICD-10-CM | POA: Diagnosis not present

## 2018-10-06 DIAGNOSIS — M7501 Adhesive capsulitis of right shoulder: Secondary | ICD-10-CM | POA: Diagnosis not present

## 2018-10-13 DIAGNOSIS — S43421D Sprain of right rotator cuff capsule, subsequent encounter: Secondary | ICD-10-CM | POA: Diagnosis not present

## 2018-10-13 DIAGNOSIS — M25611 Stiffness of right shoulder, not elsewhere classified: Secondary | ICD-10-CM | POA: Diagnosis not present

## 2018-10-13 DIAGNOSIS — M25511 Pain in right shoulder: Secondary | ICD-10-CM | POA: Diagnosis not present

## 2018-10-13 DIAGNOSIS — M6281 Muscle weakness (generalized): Secondary | ICD-10-CM | POA: Diagnosis not present

## 2018-10-20 DIAGNOSIS — M25611 Stiffness of right shoulder, not elsewhere classified: Secondary | ICD-10-CM | POA: Diagnosis not present

## 2018-10-20 DIAGNOSIS — M25511 Pain in right shoulder: Secondary | ICD-10-CM | POA: Diagnosis not present

## 2018-10-20 DIAGNOSIS — M6281 Muscle weakness (generalized): Secondary | ICD-10-CM | POA: Diagnosis not present

## 2018-10-20 DIAGNOSIS — S43421D Sprain of right rotator cuff capsule, subsequent encounter: Secondary | ICD-10-CM | POA: Diagnosis not present

## 2018-10-24 DIAGNOSIS — M6281 Muscle weakness (generalized): Secondary | ICD-10-CM | POA: Diagnosis not present

## 2018-10-24 DIAGNOSIS — M25511 Pain in right shoulder: Secondary | ICD-10-CM | POA: Diagnosis not present

## 2018-10-24 DIAGNOSIS — M25611 Stiffness of right shoulder, not elsewhere classified: Secondary | ICD-10-CM | POA: Diagnosis not present

## 2018-10-24 DIAGNOSIS — S43421D Sprain of right rotator cuff capsule, subsequent encounter: Secondary | ICD-10-CM | POA: Diagnosis not present

## 2018-10-27 DIAGNOSIS — M25511 Pain in right shoulder: Secondary | ICD-10-CM | POA: Diagnosis not present

## 2018-10-27 DIAGNOSIS — S43421D Sprain of right rotator cuff capsule, subsequent encounter: Secondary | ICD-10-CM | POA: Diagnosis not present

## 2018-10-27 DIAGNOSIS — M25611 Stiffness of right shoulder, not elsewhere classified: Secondary | ICD-10-CM | POA: Diagnosis not present

## 2018-10-27 DIAGNOSIS — M6281 Muscle weakness (generalized): Secondary | ICD-10-CM | POA: Diagnosis not present

## 2018-10-28 ENCOUNTER — Ambulatory Visit: Payer: Medicare Other | Admitting: Psychiatry

## 2018-10-31 DIAGNOSIS — M6281 Muscle weakness (generalized): Secondary | ICD-10-CM | POA: Diagnosis not present

## 2018-10-31 DIAGNOSIS — S43421D Sprain of right rotator cuff capsule, subsequent encounter: Secondary | ICD-10-CM | POA: Diagnosis not present

## 2018-10-31 DIAGNOSIS — M25511 Pain in right shoulder: Secondary | ICD-10-CM | POA: Diagnosis not present

## 2018-10-31 DIAGNOSIS — M25611 Stiffness of right shoulder, not elsewhere classified: Secondary | ICD-10-CM | POA: Diagnosis not present

## 2018-11-10 DIAGNOSIS — M25611 Stiffness of right shoulder, not elsewhere classified: Secondary | ICD-10-CM | POA: Diagnosis not present

## 2018-11-10 DIAGNOSIS — S43421D Sprain of right rotator cuff capsule, subsequent encounter: Secondary | ICD-10-CM | POA: Diagnosis not present

## 2018-11-10 DIAGNOSIS — M6281 Muscle weakness (generalized): Secondary | ICD-10-CM | POA: Diagnosis not present

## 2018-11-10 DIAGNOSIS — M25511 Pain in right shoulder: Secondary | ICD-10-CM | POA: Diagnosis not present

## 2018-11-13 ENCOUNTER — Ambulatory Visit (INDEPENDENT_AMBULATORY_CARE_PROVIDER_SITE_OTHER): Payer: Medicare Other | Admitting: Psychiatry

## 2018-11-13 DIAGNOSIS — F3181 Bipolar II disorder: Secondary | ICD-10-CM

## 2018-11-13 DIAGNOSIS — F419 Anxiety disorder, unspecified: Secondary | ICD-10-CM

## 2018-11-13 MED ORDER — DULOXETINE HCL 60 MG PO CPEP: 60.0000 mg | ORAL_CAPSULE | Freq: Every morning | ORAL | 0 refills | Status: DC

## 2018-11-13 MED ORDER — CLORAZEPATE DIPOTASSIUM 3.75 MG PO TABS: 3.7500 mg | ORAL_TABLET | Freq: Two times a day (BID) | ORAL | 1 refills | Status: DC | PRN

## 2018-11-13 MED ORDER — PROPRANOLOL HCL 10 MG PO TABS: 10.0000 mg | ORAL_TABLET | Freq: Two times a day (BID) | ORAL | 0 refills | Status: DC

## 2018-11-13 MED ORDER — LITHIUM CARBONATE 300 MG PO CAPS: ORAL_CAPSULE | ORAL | 0 refills | Status: DC

## 2018-11-13 NOTE — Progress Notes (Signed)
[facility]  Patient ID: Megan Duncan,  MRN: [id number]  PCP: Ann Held, DO  Date of Evaluation: 11/13/2018 Time spent:20 minutes  Chief Complaint:   HISTORY/CURRENT STATUS: HPI seen 09/01/18 and was doing okay.  Currently doing well. No change needed. Still with fatigue.  Individual Medical History/ Review of Systems: Changes? :No   Allergies: Atorvastatin; Codeine; Diclofenac; Doxycycline; and Sulfonamide derivatives  Current Medications:  Current Outpatient Medications:  .  albuterol (VENTOLIN HFA) 108 (90 Base) MCG/ACT inhaler, Inhale 2 puffs into the lungs every 4 (four) hours as needed. For shortness of breath and wheezing, Disp: 18 g, Rfl: 3 .  amLODipine (NORVASC) 5 MG tablet, TAKE 1 TABLET BY MOUTH EVERY DAY, Disp: 90 tablet, Rfl: 0 .  blood glucose meter kit and supplies, Dispense based on patient and insurance preference. Use once a day.  DX Code: E11.9, Disp: 1 each, Rfl: 0 .  clorazepate (TRANXENE) 3.75 MG tablet, Take 1 tablet (3.75 mg total) by mouth 2 (two) times daily as needed for anxiety., Disp: 90 tablet, Rfl: 1 .  cyclobenzaprine (FLEXERIL) 10 MG tablet, Take 1 tablet (10 mg total) by mouth 3 (three) times daily as needed. For muscle spasms, Disp: 30 tablet, Rfl: 0 .  DULoxetine (CYMBALTA) 60 MG capsule, Take 1 capsule (60 mg total) by mouth every morning., Disp: 90 capsule, Rfl: 0 .  glucose blood test strip, Use as instructed once a day.  Dx Code: E11.9, Disp: 100 each, Rfl: 12 .  hydrocortisone-pramoxine (ANALPRAM HC) 2.5-1 % rectal cream, Place 1 application rectally 3 (three) times daily. (Patient taking differently: Place 1 application rectally as needed. ), Disp: 30 g, Rfl: 3 .  Lancets MISC, Use as directed once a day.  Dx code: E11.9, Disp: 100 each, Rfl: 1 .  lithium carbonate 300 MG capsule, Take 2 capsules (600 mg total) by mouth 2 (two) times daily., Disp: 180 capsule, Rfl: 1 .  nystatin cream (MYCOSTATIN), Apply 1  application topically 2 (two) times daily., Disp: 30 g, Rfl: 0 .  predniSONE (DELTASONE) 10 MG tablet, TAKE 3 TABLETS PO QD FOR 3 DAYS THEN TAKE 2 TABLETS PO QD FOR 3 DAYS THEN TAKE 1 TABLET PO QD FOR 3 DAYS THEN TAKE 1/2 TAB PO QD FOR 3 DAYS, Disp: 20 tablet, Rfl: 0 .  propranolol (INDERAL) 10 MG tablet, Take 1 tablet (10 mg total) by mouth 2 (two) times daily., Disp: 180 tablet, Rfl: 0 .  Pyridoxine HCl (VITAMIN B-6 PO), Take 1 tablet by mouth daily., Disp: , Rfl:  .  ranitidine (ZANTAC) 150 MG tablet, Take 150 mg by mouth as needed. , Disp: , Rfl:  Medication Side Effects: none  Family Medical/ Social History: Changes? No  MENTAL HEALTH EXAM:  There were no vitals taken for this visit.There is no height or weight on file to calculate BMI.  General Appearance: Casual  Eye Contact:  Good  Speech:  Clear and Coherent  Volume:  Normal  Mood:  Euthymic  Affect:  Appropriate  Thought Process:  Linear  Orientation:  Full (Time, Place, and Person)  Thought Content: Logical   Suicidal Thoughts:  no  Homicidal Thoughts:  No  Memory:  WNL  Judgement:  Good  Insight:  Good  Psychomotor Activity:  Normal  Concentration:  Concentration: Good  Recall:  Good  Fund of Knowledge: Good  Language: Good  Assets:  Desire for Improvement  ADL's:  Intact  Cognition: WNL  Prognosis:  Good  DIAGNOSES: No diagnosis found.  Receiving Psychotherapy: No    RECOMMENDATIONS: did lithium, tsh, bmp- results sent to pcp. Did 05/19. Continue same meds. Recheck 2 monnths, will see how fatigue is and consider fatigue labs   [employer], PA-C

## 2018-11-21 DIAGNOSIS — S43421D Sprain of right rotator cuff capsule, subsequent encounter: Secondary | ICD-10-CM | POA: Diagnosis not present

## 2018-11-21 DIAGNOSIS — M6281 Muscle weakness (generalized): Secondary | ICD-10-CM | POA: Diagnosis not present

## 2018-11-21 DIAGNOSIS — M25511 Pain in right shoulder: Secondary | ICD-10-CM | POA: Diagnosis not present

## 2018-11-21 DIAGNOSIS — M25611 Stiffness of right shoulder, not elsewhere classified: Secondary | ICD-10-CM | POA: Diagnosis not present

## 2018-11-24 DIAGNOSIS — S43421D Sprain of right rotator cuff capsule, subsequent encounter: Secondary | ICD-10-CM | POA: Diagnosis not present

## 2018-11-24 DIAGNOSIS — M25611 Stiffness of right shoulder, not elsewhere classified: Secondary | ICD-10-CM | POA: Diagnosis not present

## 2018-11-24 DIAGNOSIS — M25511 Pain in right shoulder: Secondary | ICD-10-CM | POA: Diagnosis not present

## 2018-11-24 DIAGNOSIS — M6281 Muscle weakness (generalized): Secondary | ICD-10-CM | POA: Diagnosis not present

## 2018-11-25 ENCOUNTER — Telehealth: Payer: Self-pay | Admitting: *Deleted

## 2018-11-25 NOTE — Telephone Encounter (Signed)
[place] [facility] 

## 2018-11-25 NOTE — Telephone Encounter (Signed)
Copied from [place] [phone]. Topic: General - Other >> Nov 24, 2018  5:02 PM Valla Leaver wrote: Reason for CRM: Patient wants to know if there is a dematologist Dr. Etter Sjogren would recommend. She has a growth on her lip and does not need referral

## 2018-11-26 NOTE — Telephone Encounter (Signed)
Patient given recommendation.  She also stated that she had taken off the flap on her lip herself and that it had bled for a while (she does take aspirin). She did get the bleeding to stop with some ground pepper.  She stated that she will go to dermatology if it comes back.  I also advised her to watch out for infection, redness, fever, etc.

## 2018-11-28 DIAGNOSIS — M25611 Stiffness of right shoulder, not elsewhere classified: Secondary | ICD-10-CM | POA: Diagnosis not present

## 2018-11-28 DIAGNOSIS — M6281 Muscle weakness (generalized): Secondary | ICD-10-CM | POA: Diagnosis not present

## 2018-11-28 DIAGNOSIS — S43421D Sprain of right rotator cuff capsule, subsequent encounter: Secondary | ICD-10-CM | POA: Diagnosis not present

## 2018-11-28 DIAGNOSIS — M25511 Pain in right shoulder: Secondary | ICD-10-CM | POA: Diagnosis not present

## 2018-12-03 ENCOUNTER — Ambulatory Visit (INDEPENDENT_AMBULATORY_CARE_PROVIDER_SITE_OTHER): Payer: Medicare Other | Admitting: Acute Care

## 2018-12-03 ENCOUNTER — Encounter: Payer: Self-pay | Admitting: Acute Care

## 2018-12-03 VITALS — BP 128/80 | HR 82 | Temp 99.0°F | Ht 62.5 in | Wt 207.8 lb

## 2018-12-03 DIAGNOSIS — J449 Chronic obstructive pulmonary disease, unspecified: Secondary | ICD-10-CM

## 2018-12-03 DIAGNOSIS — J441 Chronic obstructive pulmonary disease with (acute) exacerbation: Secondary | ICD-10-CM | POA: Diagnosis not present

## 2018-12-03 DIAGNOSIS — J301 Allergic rhinitis due to pollen: Secondary | ICD-10-CM | POA: Diagnosis not present

## 2018-12-03 DIAGNOSIS — R05 Cough: Secondary | ICD-10-CM

## 2018-12-03 DIAGNOSIS — R059 Cough, unspecified: Secondary | ICD-10-CM

## 2018-12-03 LAB — NITRIC OXIDE: Nitric Oxide: 43

## 2018-12-03 MED ORDER — AZITHROMYCIN 250 MG PO TABS: ORAL_TABLET | ORAL | 0 refills | Status: DC

## 2018-12-03 MED ORDER — PREDNISONE 10 MG PO TABS: ORAL_TABLET | ORAL | 0 refills | Status: DC

## 2018-12-03 MED ORDER — BUDESONIDE-FORMOTEROL FUMARATE 80-4.5 MCG/ACT IN AERO: 2.0000 | INHALATION_SPRAY | Freq: Two times a day (BID) | RESPIRATORY_TRACT | 0 refills | Status: DC

## 2018-12-03 NOTE — Assessment & Plan Note (Signed)
At baseline Currently not on maintenance medication Plan: We will add Symbicort 2 puffs twice daily x 2 weeks while you are recovering from flare Continue Albuterol 2 puffs up to three times daily for shortness of breath or wheezing

## 2018-12-03 NOTE — Addendum Note (Signed)
Addended by: Jannette Spanner on: 12/03/2018 04:31 PM   Modules accepted: Orders

## 2018-12-03 NOTE — Patient Instructions (Addendum)
It is nice to see you today. We will do a FENO today.>> 47 PPB We will treat you today with a prednisone taper. Prednisone taper; 10 mg tablets: 4 tabs x 2 days, 3 tabs x 2 days, 2 tabs x 2 days 1 tab x 2 days then stop. Take in the morning. Be careful of sugar intake while on prednisone. Prednisone will increase you blood sugars while you are on there taper. Start Allegra 180 mg once daily  We will add Flonase 2 sprays each nostril once a day.( Generic is ok) We will add Symbicort 2 puffs twice daily x 2 weeks ( to get you through this bout of bronchitis) Continue using your Albuterol inhaler  as needed for shortness of breath or wheezing up to 3 times a day. Sips of water instead of throat clearing Sugar Free [employer] or [employer] originals for throat soothing. Delsym Cough syrup 5 cc's during the day Tessalon Perles at bedtime for cough We will give you a z pack paper prescription that you can fill if you do not show signs of improvement , or have worsening fever within the next 5-6 days.  Follow up appointment in 2 weeks with either Judson Roch NP or Dr. Lamonte Sakai . If you are getting worse not better, come in for sooner follow up. Please contact office for sooner follow up if symptoms do not improve or worsen or seek emergency care.

## 2018-12-03 NOTE — Assessment & Plan Note (Signed)
Bronchitis flare Recent sick contact ( grandson) Low grade fever Plan: We will do a FENO today.>> 47 PPB We will treat you today with a prednisone taper. Prednisone taper; 10 mg tablets: 4 tabs x 2 days, 3 tabs x 2 days, 2 tabs x 2 days 1 tab x 2 days then stop. Take in the morning. Be careful of sugar intake while on prednisone. Prednisone will increase you blood sugars while you are on there taper. Start Allegra 180 mg once daily  We will add Flonase 2 sprays each nostril once a day.( Generic is ok) We will add Symbicort 2 puffs twice daily x 2 weeks ( to get you through this bout of bronchitis) Continue using your Albuterol inhaler  as needed for shortness of breath or wheezing up to 3 times a day. Sips of water instead of throat clearing Sugar Free [employer] or [employer] originals for throat soothing. Delsym Cough syrup 5 cc's during the day Tessalon Perles at bedtime for cough We will give you a z pack paper prescription that you can fill if you do not show signs of improvement , or have worsening fever within the next 5-6 days.  Follow up appointment in 2 weeks with either Judson Roch NP or Dr. Lamonte Sakai . If you are getting worse not better, come in for sooner follow up. Please contact office for sooner follow up if symptoms do not improve or worsen or seek emergency care.

## 2018-12-03 NOTE — Progress Notes (Signed)
History of Present Illness Megan Duncan is a 73 y.o. 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 She has allergic rhinitis (positive allergy sensitivity to cockroaches, cat dander, others). She is followed by Dr. Lamonte Sakai.    12/03/2018  Acute OV: Pt. Presents for acute OV for cough and chest tightness. She states her symptoms include very hoarse voice with dry non-productive cough.She has + sinus drainage. She has clear secretions draining from her nose. These symptoms have been ongoing x 3 days with upper chest tightness that started 3 days ago also. She is using her albuterol inhaler 2 times daily. She states this does help relieve the chest tightness. She has had a sick contact with her grandson last week. He had a cold.She also has a low grade fever in the office today at 99.0. She denies any body aches. She denies fever, chest pain, orthopnea or hemoptysis.  Test Results: Alpha-1 antitrypsin genotype is MM. Allergy rast  panel was performed that showed no elevated IgE, sensitivities to cockroaches, cat dander.   PFT's 09/2016 Mild Obstructive Airways Disease reverses to nl c/w asthma Minimal Diffusion Defect  TTE 08/02/16 -  Study Conclusions  - Left ventricle: The cavity size was normal. Wall thickness was normal. Systolic function was normal. The estimated ejection fraction was in the range of 55% to 60%. Doppler parameters are consistent with abnormal left ventricular relaxation (grade 1 diastolic dysfunction). - normal RV size and fxn - normal RA  CBC Latest Ref Rng & Units 08/30/2017 10/18/2015 10/14/2015  WBC 3.8 - 10.8 Thousand/uL 7.7 6.9 6.5  Hemoglobin 11.7 - 15.5 g/dL 15.1 15.5(H) 15.8(H)  Hematocrit 35.0 - 45.0 % 45.4(H) 47.0(H) 46.1(H)  Platelets 140 - 400 Thousand/uL 393 302.0 303    BMP Latest Ref Rng & Units 07/08/2018 04/03/2018  08/30/2017  Glucose 70 - 99 mg/dL 159(H) 185(H) 112(H)  BUN 6 - 23 mg/dL '14 8 9  ' Creatinine 0.40 - 1.20 mg/dL 0.95 0.88 0.76  BUN/Creat Ratio 6 - 22 (calc) - - NOT APPLICABLE  Sodium 438 - 145 mEq/L 142 142 142  Potassium 3.5 - 5.1 mEq/L 5.3(H) 4.8 4.6  Chloride 96 - 112 mEq/L 106 104 105  CO2 19 - 32 mEq/L '30 31 29  ' Calcium 8.4 - 10.5 mg/dL 10.3 10.1 9.5    BNP No results found for: BNP  ProBNP No results found for: PROBNP  PFT    Component Value Date/Time   FEV1PRE 1.33 10/11/2016 1211   FEV1POST 1.54 10/11/2016 1211   FVCPRE 2.07 10/11/2016 1211   FVCPOST 2.08 10/11/2016 1211   TLC 3.94 10/11/2016 1211   DLCOUNC 14.16 10/11/2016 1211   PREFEV1FVCRT 64 10/11/2016 1211   PSTFEV1FVCRT 74 10/11/2016 1211    No results found.   Past medical hx Past Medical History:  Diagnosis Date  . Anxiety   . Arthritis    hands  . Bipolar II disorder ([place])    Dr. Charlott Holler  . COPD (chronic obstructive pulmonary disease) ([place])   . Depression   . Diastolic dysfunction    Per pt, diagnosed after [facility]  . Diverticulosis   . Fibromyalgia   . Hyperlipidemia   . Hypertension   . LBP (low back pain)      Social History   Tobacco Use  . Smoking status: Former Smoker    Packs/day: 1.50    Years: 40.00  Pack years: 60.00    Last attempt to quit: 07/02/2004    Years since quitting: 14.4  . Smokeless tobacco: Never Used  Substance Use Topics  . Alcohol use: Yes    Comment: occasional wine  . Drug use: No    Ms.Theissen reports that she quit smoking about 14 years ago. She has a 60.00 pack-year smoking history. She has never used smokeless tobacco. She reports current alcohol use. She reports that she does not use drugs.  Tobacco Cessation: Former smoker with a 60 pack year smoking history who quit 07/02/2004   Past surgical hx, Family hx, Social hx all reviewed.  Current Outpatient Medications on File Prior to Visit  Medication Sig  . albuterol (VENTOLIN HFA) 108 (90  Base) MCG/ACT inhaler Inhale 2 puffs into the lungs every 4 (four) hours as needed. For shortness of breath and wheezing  . amLODipine (NORVASC) 5 MG tablet TAKE 1 TABLET BY MOUTH EVERY DAY  . blood glucose meter kit and supplies Dispense based on patient and insurance preference. Use once a day.  DX Code: E11.9  . clorazepate (TRANXENE) 3.75 MG tablet Take 1 tablet (3.75 mg total) by mouth 2 (two) times daily as needed for anxiety.  . cyclobenzaprine (FLEXERIL) 10 MG tablet Take 1 tablet (10 mg total) by mouth 3 (three) times daily as needed. For muscle spasms  . DULoxetine (CYMBALTA) 60 MG capsule Take 1 capsule (60 mg total) by mouth every morning.  Marland Kitchen glucose blood test strip Use as instructed once a day.  Dx Code: E11.9  . hydrocortisone-pramoxine (ANALPRAM HC) 2.5-1 % rectal cream Place 1 application rectally 3 (three) times daily. (Patient taking differently: Place 1 application rectally as needed. )  . Lancets MISC Use as directed once a day.  Dx code: E11.9  . lithium carbonate 300 MG capsule 2 capsules hs  . nystatin cream (MYCOSTATIN) Apply 1 application topically 2 (two) times daily.  . propranolol (INDERAL) 10 MG tablet Take 1 tablet (10 mg total) by mouth 2 (two) times daily.  . ranitidine (ZANTAC) 150 MG tablet Take 150 mg by mouth as needed.    No current facility-administered medications on file prior to visit.      Allergies  Allergen Reactions  . Atorvastatin Other (See Comments)    Significant rise in liver tests  . Codeine Nausea And Vomiting  . Diclofenac Other (See Comments)    Elevated LFT's  . Doxycycline     Extreme nausea  . Sulfonamide Derivatives Swelling    Review Of Systems:  Constitutional:   No  weight loss, night sweats,  Fevers, chills, fatigue, or  lassitude.  HEENT:   No headaches,  Difficulty swallowing,  Tooth/dental problems, or  Sore throat,                No sneezing, itching, ear ache, + nasal congestion, + post nasal drip,   CV:  No chest  pain, + chest tightness, No Orthopnea, PND, swelling in lower extremities, anasarca, dizziness, palpitations, syncope.   GI  No heartburn, indigestion, abdominal pain, nausea, vomiting, diarrhea, change in bowel habits, loss of appetite, bloody stools.   Resp: + intermittent  shortness of breath with exertion less at rest.  No excess mucus, no productive cough,  + non-productive cough,  No coughing up of blood.  No change in color of mucus.  No wheezing.  No chest wall deformity  Skin: no rash or lesions.  GU: no dysuria, change in color of urine,  no urgency or frequency.  No flank pain, no hematuria   MS:  No joint pain or swelling.  No decreased range of motion.  No back pain.  Psych:  No change in mood or affect. No depression or anxiety.  No memory loss.   Vital Signs BP 128/80 (BP Location: Left Arm, Cuff Size: Large)   Pulse 82   Temp 99 F (37.2 C) (Oral)   Ht 5' 2.5" (1.588 m)   Wt 207 lb 12.8 oz (94.3 kg)   SpO2 96%   BMI 37.40 kg/m    Physical Exam:  General- No distress,  A&Ox3, pleasant ENT: No sinus tenderness, TM clear, pale nasal mucosa, no oral exudate,+ post nasal drip, no LAN Cardiac: S1, S2, regular rate and rhythm, no murmur Chest: No wheeze/ rales/ dullness; no accessory muscle use, no nasal flaring, no sternal retractions Abd.: Soft Non-tender, ND, BS +, Body mass index is 37.4 kg/m. Ext: No clubbing cyanosis, edema Neuro:  normal strength, Cranial nerves intact, alert and oriented, appropriate Skin: No rashes,or lesions, warm and dry Psych: normal mood and behavior   Assessment/Plan  Bronchitis, chronic obstructive, with exacerbation (HCC) Bronchitis flare Recent sick contact ( grandson) Low grade fever Plan: We will do a FENO today.>> 47 PPB We will treat you today with a prednisone taper. Prednisone taper; 10 mg tablets: 4 tabs x 2 days, 3 tabs x 2 days, 2 tabs x 2 days 1 tab x 2 days then stop. Take in the morning. Be careful of sugar  intake while on prednisone. Prednisone will increase you blood sugars while you are on there taper. Start Allegra 180 mg once daily  We will add Flonase 2 sprays each nostril once a day.( Generic is ok) We will add Symbicort 2 puffs twice daily x 2 weeks ( to get you through this bout of bronchitis) Continue using your Albuterol inhaler  as needed for shortness of breath or wheezing up to 3 times a day. Sips of water instead of throat clearing Sugar Free [employer] or [employer] originals for throat soothing. Delsym Cough syrup 5 cc's during the day Tessalon Perles at bedtime for cough We will give you a z pack paper prescription that you can fill if you do not show signs of improvement , or have worsening fever within the next 5-6 days.  Follow up appointment in 2 weeks with either Judson Roch NP or Dr. Lamonte Sakai . If you are getting worse not better, come in for sooner follow up. Please contact office for sooner follow up if symptoms do not improve or worsen or seek emergency care.    COPD (chronic obstructive pulmonary disease) (HCC) At baseline Currently not on maintenance medication Plan: We will add Symbicort 2 puffs twice daily x 2 weeks while you are recovering from flare Continue Albuterol 2 puffs up to three times daily for shortness of breath or wheezing   Allergic rhinitis Flare Plan: Start Allegra 180 mg once daily  We will add Flonase 2 sprays each nostril once a day.( Generic is ok) We will add Symbicort 2 puffs twice daily x 2 weeks ( to get you through this bout of bronchitis) Follow up appointment in 2 weeks with either Judson Roch NP or Dr. Lamonte Sakai . If you are getting worse not better, come in for sooner follow up. Please contact office for sooner follow up if symptoms do not improve or worsen or seek emergency care.     Magdalen Spatz, NP 12/03/2018  4:09  PM

## 2018-12-03 NOTE — Assessment & Plan Note (Signed)
Flare Plan: Start Allegra 180 mg once daily  We will add Flonase 2 sprays each nostril once a day.( Generic is ok) We will add Symbicort 2 puffs twice daily x 2 weeks ( to get you through this bout of bronchitis) Follow up appointment in 2 weeks with either Judson Roch NP or Dr. Lamonte Sakai . If you are getting worse not better, come in for sooner follow up. Please contact office for sooner follow up if symptoms do not improve or worsen or seek emergency care.

## 2018-12-09 ENCOUNTER — Ambulatory Visit: Payer: Medicare Other | Admitting: Family Medicine

## 2018-12-12 ENCOUNTER — Other Ambulatory Visit: Payer: Self-pay | Admitting: Family Medicine

## 2018-12-12 ENCOUNTER — Encounter: Payer: Self-pay | Admitting: Family Medicine

## 2018-12-12 ENCOUNTER — Ambulatory Visit (INDEPENDENT_AMBULATORY_CARE_PROVIDER_SITE_OTHER): Payer: Medicare Other | Admitting: Family Medicine

## 2018-12-12 VITALS — BP 134/82 | HR 78 | Resp 12 | Ht 62.5 in | Wt 204.0 lb

## 2018-12-12 DIAGNOSIS — Z86010 Personal history of colonic polyps: Secondary | ICD-10-CM

## 2018-12-12 DIAGNOSIS — I10 Essential (primary) hypertension: Secondary | ICD-10-CM | POA: Diagnosis not present

## 2018-12-12 DIAGNOSIS — E785 Hyperlipidemia, unspecified: Secondary | ICD-10-CM | POA: Diagnosis not present

## 2018-12-12 DIAGNOSIS — E1151 Type 2 diabetes mellitus with diabetic peripheral angiopathy without gangrene: Secondary | ICD-10-CM | POA: Diagnosis not present

## 2018-12-12 DIAGNOSIS — F319 Bipolar disorder, unspecified: Secondary | ICD-10-CM | POA: Diagnosis not present

## 2018-12-12 DIAGNOSIS — E1165 Type 2 diabetes mellitus with hyperglycemia: Secondary | ICD-10-CM

## 2018-12-12 DIAGNOSIS — F3181 Bipolar II disorder: Secondary | ICD-10-CM

## 2018-12-12 DIAGNOSIS — E782 Mixed hyperlipidemia: Secondary | ICD-10-CM

## 2018-12-12 DIAGNOSIS — E1169 Type 2 diabetes mellitus with other specified complication: Secondary | ICD-10-CM

## 2018-12-12 DIAGNOSIS — IMO0002 Reserved for concepts with insufficient information to code with codable children: Secondary | ICD-10-CM

## 2018-12-12 LAB — COMPREHENSIVE METABOLIC PANEL
ALK PHOS: 68 U/L (ref 39–117)
ALT: 15 U/L (ref 0–35)
AST: 16 U/L (ref 0–37)
Albumin: 4.4 g/dL (ref 3.5–5.2)
BUN: 11 mg/dL (ref 6–23)
CO2: 26 mEq/L (ref 19–32)
Calcium: 9.4 mg/dL (ref 8.4–10.5)
Chloride: 101 mEq/L (ref 96–112)
Creatinine, Ser: 0.82 mg/dL (ref 0.40–1.20)
GFR: 68.41 mL/min (ref >60.00)
Glucose, Bld: 158 mg/dL — ABNORMAL HIGH (ref 70–99)
Potassium: 4.2 mEq/L (ref 3.5–5.1)
Sodium: 139 mEq/L (ref 135–145)
TOTAL PROTEIN: 6.8 g/dL (ref 6.0–8.3)
Total Bilirubin: 0.8 mg/dL (ref 0.2–1.2)

## 2018-12-12 LAB — LIPID PANEL
Cholesterol: 194 mg/dL (ref 0–200)
HDL: 73.5 mg/dL (ref >39.00)
LDL Cholesterol: 94 mg/dL (ref 0–99)
NonHDL: 120.28
Total CHOL/HDL Ratio: 3
Triglycerides: 131 mg/dL (ref 0.0–149.0)
VLDL: 26.2 mg/dL (ref 0.0–40.0)

## 2018-12-12 LAB — HEMOGLOBIN A1C: Hgb A1c MFr Bld: 6.8 % — ABNORMAL HIGH (ref 4.6–6.5)

## 2018-12-12 LAB — TSH: TSH: 4.6 u[IU]/mL — ABNORMAL HIGH (ref 0.35–4.50)

## 2018-12-12 NOTE — Assessment & Plan Note (Signed)
Well controlled, no changes to meds. Encouraged heart healthy diet such as the DASH diet and exercise as tolerated.  °

## 2018-12-12 NOTE — Assessment & Plan Note (Signed)
Check labs today [id number] to be checked, minimize simple carbs. Increase exercise as tolerated. Continue current meds Pt has had a steroid injection and pred taper from ortho

## 2018-12-12 NOTE — Assessment & Plan Note (Signed)
D/w pt healthy weight and wellness She will think about it

## 2018-12-12 NOTE — Assessment & Plan Note (Signed)
con't with psych Check labs

## 2018-12-12 NOTE — Assessment & Plan Note (Signed)
Encouraged heart healthy diet, increase exercise, avoid trans fats, consider a krill oil cap daily 

## 2018-12-12 NOTE — Patient Instructions (Signed)
Diabetes Mellitus and Standards of Medical Care Managing diabetes (diabetes mellitus) can be complicated. Your diabetes treatment may be managed by a team of health care providers, including:  A physician who specializes in diabetes (endocrinologist).  A nurse practitioner or physician assistant.  Nurses.  A diet and nutrition specialist (registered dietitian).  A certified diabetes educator (CDE).  An exercise specialist.  A pharmacist.  An eye doctor.  A foot specialist (podiatrist).  A dentist.  A primary care provider.  A mental health provider. Your health care providers follow guidelines to help you get the best quality of care. The following schedule is a general guideline for your diabetes management plan. Your health care providers may give you more specific instructions. Physical exams Upon being diagnosed with diabetes mellitus, and each year after that, your health care provider will ask about your medical and family history. He or she will also do a physical exam. Your exam may include:  Measuring your height, weight, and body mass index (BMI).  Checking your blood pressure. This will be done at every routine medical visit. Your target blood pressure may vary depending on your medical conditions, your age, and other factors.  Thyroid gland exam.  Skin exam.  Screening for damage to your nerves (peripheral neuropathy). This may include checking the pulse in your legs and feet and checking the level of sensation in your hands and feet.  A complete foot exam to inspect the structure and skin of your feet, including checking for cuts, bruises, redness, blisters, sores, or other problems.  Screening for blood vessel (vascular) problems, which may include checking the pulse in your legs and feet and checking your temperature. Blood tests Depending on your treatment plan and your personal needs, you may have the following tests done:  HbA1c (hemoglobin A1c). This  test provides information about blood sugar (glucose) control over the previous 2-3 months. It is used to adjust your treatment plan, if needed. This test will be done: ? At least 2 times a year, if you are meeting your treatment goals. ? 4 times a year, if you are not meeting your treatment goals or if treatment goals have changed.  Lipid testing, including total, LDL, and HDL cholesterol and triglyceride levels. ? The goal for LDL is less than 100 mg/dL (5.5 mmol/L). If you are at high risk for complications, the goal is less than 70 mg/dL (3.9 mmol/L). ? The goal for HDL is 40 mg/dL (2.2 mmol/L) or higher for men and 50 mg/dL (2.8 mmol/L) or higher for women. An HDL cholesterol of 60 mg/dL (3.3 mmol/L) or higher gives some protection against heart disease. ? The goal for triglycerides is less than 150 mg/dL (8.3 mmol/L).  Liver function tests.  Kidney function tests.  Thyroid function tests. Dental and eye exams  Visit your dentist two times a year.  If you have type 1 diabetes, your health care provider may recommend an eye exam 3-5 years after you are diagnosed, and then once a year after your first exam. ? For children with type 1 diabetes, a health care provider may recommend an eye exam when your child is age 10 or older and has had diabetes for 3-5 years. After the first exam, your child should get an eye exam once a year.  If you have type 2 diabetes, your health care provider may recommend an eye exam as soon as you are diagnosed, and then once a year after your first exam. Immunizations   The  your first exam.  Immunizations    The yearly flu (influenza) vaccine is recommended for everyone 6 months or older who has diabetes.  The pneumonia (pneumococcal) vaccine is recommended for everyone 2 years or older who has diabetes. If you are 65 or older, you may get the pneumonia vaccine as a series of two separate shots.  The hepatitis B vaccine is recommended for adults shortly after being diagnosed with diabetes.  Adults and children with diabetes should receive all other vaccines  according to age-specific recommendations from the Centers for Disease Control and Prevention (CDC).  Mental and emotional health  Screening for symptoms of eating disorders, anxiety, and depression is recommended at the time of diagnosis and afterward as needed. If your screening shows that you have symptoms (positive screening result), you may need more evaluation and you may work with a mental health care provider.  Treatment plan  Your treatment plan will be reviewed at every medical visit. You and your health care provider will discuss:  How you are taking your medicines, including insulin.  Any side effects you are experiencing.  Your blood glucose target goals.  The frequency of your blood glucose monitoring.  Lifestyle habits, such as activity level as well as tobacco, alcohol, and substance use.  Diabetes self-management education  Your health care provider will assess how well you are monitoring your blood glucose levels and whether you are taking your insulin correctly. He or she may refer you to:  A certified diabetes educator to manage your diabetes throughout your life, starting at diagnosis.  A registered dietitian who can create or review your personal nutrition plan.  An exercise specialist who can discuss your activity level and exercise plan.  Summary  Managing diabetes (diabetes mellitus) can be complicated. Your diabetes treatment may be managed by a team of health care providers.  Your health care providers follow guidelines in order to help you get the best quality of care.  Standards of care including having regular physical exams, blood tests, blood pressure monitoring, immunizations, screening tests, and education about how to manage your diabetes.  Your health care providers may also give you more specific instructions based on your individual health.  This information is not intended to replace advice given to you by your health care provider. Make sure you discuss any questions you have  with your health care provider.  Document Released: 08/26/2009 Document Revised: 07/18/2018 Document Reviewed: 07/27/2016  Elsevier Interactive Patient Education  2019 Elsevier Inc.

## 2018-12-12 NOTE — Progress Notes (Signed)
Patient ID: Megan Duncan, female    DOB: 1946/03/06  Age: 73 y.o. MRN: [id number]    Subjective:  Subjective  HPI Megan Duncan presents for f/u hyperlipidemia,  htn and dm.  HPI HYPERTENSION   Blood pressure range-not checking   Chest pain- no      Dyspnea- no Lightheadedness- no   Edema- no  Other side effects - no   Medication compliance: good Low salt diet- yes    DIABETES    Blood Sugar ranges-elevated per pt   Polyuria- no New Visual problems- no  Hypoglycemic symptoms- no  Other side effects-no Medication compliance - good Last eye exam- due  Foot exam- today   HYPERLIPIDEMIA  Medication compliance- good RUQ pain- no  Muscle aches- no Other side effects-no       Review of Systems  Constitutional: Negative for appetite change, diaphoresis, fatigue and unexpected weight change.  Eyes: Negative for pain, redness and visual disturbance.  Respiratory: Negative for cough, chest tightness, shortness of breath and wheezing.   Cardiovascular: Negative for chest pain, palpitations and leg swelling.  Endocrine: Negative for cold intolerance, heat intolerance, polydipsia, polyphagia and polyuria.  Genitourinary: Negative for difficulty urinating, dysuria and frequency.  Neurological: Negative for dizziness, light-headedness, numbness and headaches.    History Past Medical History:  Diagnosis Date  . Anxiety   . Arthritis    hands  . Bipolar II disorder ([place])    Dr. Charlott Holler  . COPD (chronic obstructive pulmonary disease) ([place])   . Depression   . Diastolic dysfunction    Per pt, diagnosed after [facility]  . Diverticulosis   . Fibromyalgia   . Hyperlipidemia   . Hypertension   . LBP (low back pain)     Megan Duncan has a past surgical history that includes Appendectomy; Total abdominal hysterectomy; Tonsillectomy; and Knee arthroscopy (Right, 2008).   Her family history includes Alcoholism in her father; Breast cancer in her maternal aunt; Colon cancer (age of  onset: 87) in her maternal aunt; Diabetes in her father; Heart attack in her father; Hiatal hernia in her mother; Hypertension in her father and mother; Mental illness in her sister.Megan Duncan reports that Megan Duncan quit smoking about 14 years ago. Megan Duncan has a 60.00 pack-year smoking history. Megan Duncan has never used smokeless tobacco. Megan Duncan reports current alcohol use. Megan Duncan reports that Megan Duncan does not use drugs.  Current Outpatient Medications on File Prior to Visit  Medication Sig Dispense Refill  . albuterol (VENTOLIN HFA) 108 (90 Base) MCG/ACT inhaler Inhale 2 puffs into the lungs every 4 (four) hours as needed. For shortness of breath and wheezing 18 g 3  . amLODipine (NORVASC) 5 MG tablet TAKE 1 TABLET BY MOUTH EVERY DAY 90 tablet 0  . azithromycin (ZITHROMAX) 250 MG tablet Take 2 pills today then one a day for 4 additional days 6 tablet 0  . blood glucose meter kit and supplies Dispense based on patient and insurance preference. Use once a day.  DX Code: E11.9 1 each 0  . budesonide-formoterol (SYMBICORT) 80-4.5 MCG/ACT inhaler Inhale 2 puffs into the lungs 2 (two) times daily. 1 Inhaler 0  . clorazepate (TRANXENE) 3.75 MG tablet Take 1 tablet (3.75 mg total) by mouth 2 (two) times daily as needed for anxiety. 180 tablet 1  . cyclobenzaprine (FLEXERIL) 10 MG tablet Take 1 tablet (10 mg total) by mouth 3 (three) times daily as needed. For muscle spasms 30 tablet 0  . DULoxetine (CYMBALTA) 60 MG capsule Take 1 capsule (60  mg total) by mouth every morning. 90 capsule 0  . glucose blood test strip Use as instructed once a day.  Dx Code: E11.9 100 each 12  . hydrocortisone-pramoxine (ANALPRAM HC) 2.5-1 % rectal cream Place 1 application rectally 3 (three) times daily. (Patient taking differently: Place 1 application rectally as needed. ) 30 g 3  . Lancets MISC Use as directed once a day.  Dx code: E11.9 100 each 1  . lithium carbonate 300 MG capsule 2 capsules hs 180 capsule 0  . nystatin cream (MYCOSTATIN) Apply 1  application topically 2 (two) times daily. 30 g 0  . predniSONE (DELTASONE) 10 MG tablet Take 4 tabs for 2 days, then 3 tabs for 2 days, 2 tabs for 2 days, then 1 tab for 2 days, then stop. 20 tablet 0  . propranolol (INDERAL) 10 MG tablet Take 1 tablet (10 mg total) by mouth 2 (two) times daily. 180 tablet 0  . ranitidine (ZANTAC) 150 MG tablet Take 150 mg by mouth as needed.      No current facility-administered medications on file prior to visit.      Objective:  Objective  Physical Exam Vitals signs and nursing note reviewed.  Constitutional:      Appearance: Megan Duncan is well-developed.  HENT:     Head: Normocephalic and atraumatic.  Eyes:     Conjunctiva/sclera: Conjunctivae normal.  Neck:     Musculoskeletal: Normal range of motion and neck supple.     Thyroid: No thyromegaly.     Vascular: No carotid bruit or JVD.  Cardiovascular:     Rate and Rhythm: Normal rate and regular rhythm.     Heart sounds: Normal heart sounds. No murmur.  Pulmonary:     Effort: Pulmonary effort is normal. No respiratory distress.     Breath sounds: Normal breath sounds. No wheezing or rales.  Chest:     Chest wall: No tenderness.  Neurological:     Mental Status: Megan Duncan is alert and oriented to person, place, and time.    Diabetic Foot Exam - Simple   Simple Foot Form Diabetic Foot exam was performed with the following findings:  Yes 12/12/2018 11:15 AM  Visual Inspection No deformities, no ulcerations, no other skin breakdown bilaterally:  Yes Sensation Testing Intact to touch and monofilament testing bilaterally:  Yes Pulse Check Posterior Tibialis and Dorsalis pulse intact bilaterally:  Yes Comments     BP 134/82 (BP Location: Left Arm, Patient Position: Sitting, Cuff Size: Large)   Pulse 78   Resp 12   Ht 5' 2.5" (1.588 m)   Wt 204 lb (92.5 kg)   SpO2 96%   BMI 36.72 kg/m  Wt Readings from Last 3 Encounters:  12/12/18 204 lb (92.5 kg)  12/03/18 207 lb 12.8 oz (94.3 kg)  09/23/18  207 lb (93.9 kg)     Lab Results  Component Value Date   WBC 7.7 08/30/2017   HGB 15.1 08/30/2017   HCT 45.4 (H) 08/30/2017   PLT 393 08/30/2017   GLUCOSE 159 (H) 07/08/2018   CHOL 179 07/08/2018   TRIG 92.0 07/08/2018   HDL 63.30 07/08/2018   LDLDIRECT 120.1 12/03/2013   LDLCALC 97 07/08/2018   ALT 12 07/08/2018   AST 16 07/08/2018   NA 142 07/08/2018   K 5.3 (H) 07/08/2018   CL 106 07/08/2018   CREATININE 0.95 07/08/2018   BUN 14 07/08/2018   CO2 30 07/08/2018   TSH 2.99 04/03/2018   INR 1.0 10/25/2015  HGBA1C 6.0 07/08/2018   MICROALBUR 1.0 07/08/2018    Ct Chest Lung Ca Screen Low Dose W/o Cm  Result Date: 07/09/2018 CLINICAL DATA:  74 year old female former smoker (quit in 2005) with 63 pack-year history of smoking. Lung cancer screening examination. EXAM: CT CHEST WITHOUT CONTRAST LOW-DOSE FOR LUNG CANCER SCREENING TECHNIQUE: Multidetector CT imaging of the chest was performed following the standard protocol without IV contrast. COMPARISON:  No priors. FINDINGS: Cardiovascular: Heart size is normal. There is no significant pericardial fluid, thickening or pericardial calcification. There is aortic atherosclerosis, as well as atherosclerosis of the great vessels of the mediastinum and the coronary arteries, including calcified atherosclerotic plaque in the left main, left anterior descending and left circumflex coronary arteries. Mediastinum/Nodes: No pathologically enlarged mediastinal or hilar lymph nodes. Please note that accurate exclusion of hilar adenopathy is limited on noncontrast CT scans. Esophagus is unremarkable in appearance. No axillary lymphadenopathy. Lungs/Pleura: Multiple small pulmonary nodules are noted throughout the lungs bilaterally, largest of which is in the periphery of the right lower lobe near the base (axial image 259 of series 3), with a volume derived mean diameter of only 4.2 mm. No larger more suspicious appearing pulmonary nodules or masses are  noted. No acute consolidative airspace disease. No pleural effusions. Mild diffuse bronchial wall thickening with mild centrilobular and paraseptal emphysema. Upper Abdomen: Aortic atherosclerosis. Mild diffuse low attenuation noted throughout the visualized portions of the hepatic parenchyma, indicative of hepatic steatosis. Musculoskeletal: There are no aggressive appearing lytic or blastic lesions noted in the visualized portions of the skeleton. IMPRESSION: 1. Lung-RADS 2S, benign appearance or behavior. Continue annual screening with low-dose chest CT without contrast in 12 months. 2. The "S" modifier above refers to potentially clinically significant non lung cancer related findings. Specifically, there is aortic atherosclerosis, in addition to left main and 2 vessel coronary artery disease. Assessment for potential risk factor modification, dietary therapy or pharmacologic therapy may be warranted, if clinically indicated. 3. Mild diffuse bronchial wall thickening with mild centrilobular and paraseptal emphysema; imaging findings suggestive of underlying COPD. 4. Mild hepatic steatosis. Aortic Atherosclerosis (ICD10-I70.0) and Emphysema (ICD10-J43.9). Electronically Signed   By: Vinnie Langton M.D.   On: 07/09/2018 13:25     Assessment & Plan:  Plan  I am having Megan Huff "[name]" maintain her cyclobenzaprine, hydrocortisone-pramoxine, ranitidine, nystatin cream, Lancets, glucose blood, blood glucose meter kit and supplies, albuterol, amLODipine, clorazepate, lithium carbonate, DULoxetine, propranolol, predniSONE, azithromycin, and budesonide-formoterol.  No orders of the defined types were placed in this encounter.   Problem List Items Addressed This Visit      Unprioritized   Bipolar 2 disorder ([place])    con't with psych Check labs       Relevant Orders   Lithium level   TSH   DM (diabetes mellitus) type II uncontrolled, periph vascular disorder ([place]) - Primary    Check labs  today hgba1c to be checked, minimize simple carbs. Increase exercise as tolerated. Continue current meds Pt has had a steroid injection and pred taper from ortho        Relevant Orders   Hemoglobin A1c   Essential hypertension    Well controlled, no changes to meds. Encouraged heart healthy diet such as the DASH diet and exercise as tolerated.       Relevant Orders   Lipid panel   Hemoglobin A1c   Comprehensive metabolic panel   Hyperlipidemia    Encouraged heart healthy diet, increase exercise, avoid trans fats, consider a  krill oil cap daily      Morbid obesity ([place])    D/w pt healthy weight and wellness Megan Duncan will think about it        Other Visit Diagnoses    Hyperlipidemia associated with type 2 diabetes mellitus ([place])       Relevant Orders   Lipid panel   Hemoglobin A1c   Comprehensive metabolic panel   History of colon polyps       Relevant Orders   Ambulatory referral to Gastroenterology      Follow-up: Return in about 6 months (around 06/12/2019), or if symptoms worsen or fail to improve, for annual exam, fasting.  Ann Held, DO

## 2018-12-13 LAB — LITHIUM LEVEL: Lithium Lvl: 0.4 mmol/L — ABNORMAL LOW (ref 0.6–1.2)

## 2018-12-17 ENCOUNTER — Other Ambulatory Visit: Payer: Self-pay | Admitting: Family Medicine

## 2018-12-17 ENCOUNTER — Ambulatory Visit: Payer: Medicare Other | Admitting: Acute Care

## 2018-12-17 MED ORDER — AMLODIPINE BESYLATE 5 MG PO TABS: ORAL_TABLET | ORAL | 0 refills | Status: DC

## 2018-12-17 NOTE — Telephone Encounter (Signed)
Requested Prescriptions  Pending Prescriptions Disp Refills  . amLODipine (NORVASC) 5 MG tablet 90 tablet 0    Sig: TAKE 1 TABLET BY MOUTH EVERY DAY     Cardiovascular:  Calcium Channel Blockers Passed - 12/17/2018  1:49 PM      Passed - Last BP in normal range    BP Readings from Last 1 Encounters:  12/12/18 134/82         Passed - Valid encounter within last 6 months    Recent Outpatient Visits          5 days ago DM (diabetes mellitus) type II uncontrolled, periph vascular disorder ([place])   [facility], DO   2 months ago Acute pain of right shoulder   [facility] at [place], DO   6 months ago DM (diabetes mellitus) type II uncontrolled, periph vascular disorder ([place])   [facility] at [place], DO   8 months ago Abnormal thyroid function test   [facility] [place], DO   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

## 2018-12-17 NOTE — Telephone Encounter (Signed)
Called to verify that pt wanted to use [facility] mail order service for refills. Pt confirms that this is the pharmacy she would like to use and states that on the back of the card it states [facility] which is the same pharmacy.

## 2018-12-17 NOTE — Telephone Encounter (Signed)
Copied from [employer] [id number]. Topic: Quick Communication - See Telephone Encounter >> Dec 17, 2018 11:29 AM Ivar Drape wrote: CRM for notification. See Telephone encounter for: 12/17/18. Patient would like a refill on her amLODipine (NORVASC) 5 MG tablet medication and have it sent to her mail order pharmacy [facility], [place], [place] [address] [phone]  NOTE:  Patient would like long term prescriptions to be sent to the mail order pharmacy, but one time prescriptions to [facility] on [address].

## 2018-12-18 ENCOUNTER — Other Ambulatory Visit: Payer: Self-pay

## 2018-12-18 MED ORDER — LITHIUM CARBONATE 300 MG PO CAPS: ORAL_CAPSULE | ORAL | 0 refills | Status: DC

## 2018-12-18 MED ORDER — DULOXETINE HCL 60 MG PO CPEP: 60.0000 mg | ORAL_CAPSULE | Freq: Every morning | ORAL | 0 refills | Status: DC

## 2018-12-18 MED ORDER — PROPRANOLOL HCL 10 MG PO TABS: 10.0000 mg | ORAL_TABLET | Freq: Two times a day (BID) | ORAL | 0 refills | Status: DC

## 2018-12-19 ENCOUNTER — Telehealth: Payer: Self-pay | Admitting: Psychiatry

## 2018-12-19 NOTE — Telephone Encounter (Signed)
Patient stated medication usually go to [facility] when script was called in it went to [facility] a fax was sent from [facility] on yesterday patient is good on medication but from now on [facility] if you have any questions please call her

## 2018-12-26 ENCOUNTER — Telehealth: Payer: Self-pay | Admitting: Psychiatry

## 2018-12-26 NOTE — Telephone Encounter (Signed)
Labs from PCP show HgA1c elevated at 6.8 Bmp ok except glucose 158 Lithium 0.4 ( normal is 0.6-1.2) tsh elevated at 4.6 (.35-4.50

## 2019-01-09 ENCOUNTER — Encounter: Payer: Self-pay | Admitting: Gastroenterology

## 2019-01-12 ENCOUNTER — Ambulatory Visit (INDEPENDENT_AMBULATORY_CARE_PROVIDER_SITE_OTHER): Payer: Medicare Other | Admitting: Psychiatry

## 2019-01-12 DIAGNOSIS — F3181 Bipolar II disorder: Secondary | ICD-10-CM | POA: Diagnosis not present

## 2019-01-12 MED ORDER — TOPIRAMATE 25 MG PO TABS: 25.0000 mg | ORAL_TABLET | Freq: Two times a day (BID) | ORAL | 1 refills | Status: DC

## 2019-01-12 NOTE — Progress Notes (Signed)
[facility] Check  Patient ID: Megan Duncan,  MRN: [id number]  PCP: Ann Held, DO  Date of Evaluation: 01/12/2019 Time spent:20 minutes  Chief Complaint:   HISTORY/CURRENT STATUS: HPI patient stated her mood is okay.  He does sleep a lot and she is in a lot of pain. Her mood is okay.  Individual Medical History/ Review of Systems: Changes? :No   Allergies: Atorvastatin; Codeine; Diclofenac; Doxycycline; and Sulfonamide derivatives  Current Medications:  Current Outpatient Medications:  .  clorazepate (TRANXENE) 3.75 MG tablet, Take 1 tablet (3.75 mg total) by mouth 2 (two) times daily as needed for anxiety., Disp: 180 tablet, Rfl: 1 .  DULoxetine (CYMBALTA) 60 MG capsule, Take 1 capsule (60 mg total) by mouth every morning., Disp: 90 capsule, Rfl: 0 .  lithium carbonate 300 MG capsule, 2 capsules hs, Disp: 180 capsule, Rfl: 0 .  albuterol (VENTOLIN HFA) 108 (90 Base) MCG/ACT inhaler, Inhale 2 puffs into the lungs every 4 (four) hours as needed. For shortness of breath and wheezing, Disp: 18 g, Rfl: 3 .  amLODipine (NORVASC) 5 MG tablet, TAKE 1 TABLET BY MOUTH EVERY DAY, Disp: 90 tablet, Rfl: 0 .  azithromycin (ZITHROMAX) 250 MG tablet, Take 2 pills today then one a day for 4 additional days, Disp: 6 tablet, Rfl: 0 .  blood glucose meter kit and supplies, Dispense based on patient and insurance preference. Use once a day.  DX Code: E11.9, Disp: 1 each, Rfl: 0 .  budesonide-formoterol (SYMBICORT) 80-4.5 MCG/ACT inhaler, Inhale 2 puffs into the lungs 2 (two) times daily., Disp: 1 Inhaler, Rfl: 0 .  cyclobenzaprine (FLEXERIL) 10 MG tablet, Take 1 tablet (10 mg total) by mouth 3 (three) times daily as needed. For muscle spasms, Disp: 30 tablet, Rfl: 0 .  glucose blood test strip, Use as instructed once a day.  Dx Code: E11.9, Disp: 100 each, Rfl: 12 .  hydrocortisone-pramoxine (ANALPRAM HC) 2.5-1 % rectal cream, Place 1 application rectally 3 (three) times daily.  (Patient taking differently: Place 1 application rectally as needed. ), Disp: 30 g, Rfl: 3 .  Lancets MISC, Use as directed once a day.  Dx code: E11.9, Disp: 100 each, Rfl: 1 .  nystatin cream (MYCOSTATIN), Apply 1 application topically 2 (two) times daily., Disp: 30 g, Rfl: 0 .  predniSONE (DELTASONE) 10 MG tablet, Take 4 tabs for 2 days, then 3 tabs for 2 days, 2 tabs for 2 days, then 1 tab for 2 days, then stop., Disp: 20 tablet, Rfl: 0 .  propranolol (INDERAL) 10 MG tablet, Take 1 tablet (10 mg total) by mouth 2 (two) times daily., Disp: 180 tablet, Rfl: 0 .  ranitidine (ZANTAC) 150 MG tablet, Take 150 mg by mouth as needed. , Disp: , Rfl:  .  topiramate (TOPAMAX) 25 MG tablet, Take 1 tablet (25 mg total) by mouth 2 (two) times daily., Disp: 60 tablet, Rfl: 1 Medication Side Effects: none  Family Medical/ Social History: Changes? No  MENTAL HEALTH EXAM:  There were no vitals taken for this visit.There is no height or weight on file to calculate BMI.  General Appearance: Casual  Eye Contact:  Good  Speech:  Clear and Coherent  Volume:  Normal  Mood:  Euthymic  Affect:  Appropriate  Thought Process:  Goal Directed  Orientation:  Full (Time, Place, and Person)  Thought Content: Logical   Suicidal Thoughts:  No  Homicidal Thoughts:  No  Memory:  WNL  Judgement:  Good  Insight:  Good  Psychomotor Activity:  Normal  Concentration:  Concentration: Good  Recall:  Good  Fund of Knowledge: Good  Language: Good  Assets:  Desire for Improvement  ADL's:  Intact  Cognition: WNL  Prognosis:  Good    DIAGNOSES:    ICD-10-CM   1. Bipolar 2 disorder ([place]) F31.81     Receiving Psychotherapy: No    RECOMMENDATIONS: We will need to continue the same dose.  Has recently been increased on Cymbalta for fibromyalgia.  We are starting her on Topamax to help with weight loss and cravings. We will follow her labs.  As her family physician will do that also.   Comer Locket, PA-C

## 2019-01-20 ENCOUNTER — Ambulatory Visit: Payer: Medicare Other | Admitting: Family Medicine

## 2019-02-02 ENCOUNTER — Encounter: Payer: Medicare Other | Admitting: Gastroenterology

## 2019-02-20 ENCOUNTER — Other Ambulatory Visit: Payer: Self-pay | Admitting: Family Medicine

## 2019-02-20 ENCOUNTER — Other Ambulatory Visit: Payer: Self-pay | Admitting: Nurse Practitioner

## 2019-02-20 DIAGNOSIS — M436 Torticollis: Secondary | ICD-10-CM

## 2019-02-20 NOTE — Telephone Encounter (Signed)
Copied from [place] [id number]. Topic: Quick Communication - Rx Refill/Question >> Feb 20, 2019 11:06 AM Virl Axe D wrote: Medication: cyclobenzaprine (FLEXERIL) 10 MG tablet  Has the patient contacted their pharmacy? Yes.   (Agent: If no, request that the patient contact the pharmacy for the refill.) (Agent: If yes, when and what did the pharmacy advise?)  Preferred Pharmacy (with phone number or street name): [id number] [place], [place] - [address] [phone] (Phone) [phone] (Fax)  Agent: Please be advised that RX refills may take up to 3 business days. We ask that you follow-up with your pharmacy.

## 2019-02-23 MED ORDER — CYCLOBENZAPRINE HCL 10 MG PO TABS: 10.0000 mg | ORAL_TABLET | Freq: Three times a day (TID) | ORAL | 0 refills | Status: DC | PRN

## 2019-03-16 ENCOUNTER — Other Ambulatory Visit: Payer: Medicare Other

## 2019-03-24 ENCOUNTER — Telehealth: Payer: Self-pay | Admitting: Physician Assistant

## 2019-03-24 NOTE — Telephone Encounter (Signed)
Please triage.  Given the above sx, I'd recommend increasing the Cymbalta to 90mg .

## 2019-03-24 NOTE — Telephone Encounter (Signed)
Error

## 2019-03-24 NOTE — Telephone Encounter (Signed)
Patient has appointment with you in June takes the lowest dosage of Lithium would like to know what to do about new symptoms she is having, going through depression, sleeping a lot

## 2019-03-24 NOTE — Telephone Encounter (Signed)
Either option is okay: 1-Increase Cymbalta to a total of 90 mg every morning.  I looked back in her chart and saw that she has fibromyalgia so the Cymbalta could help with her pain as well as with her mood and motivation.  2-increasing the lithium be okay to.  If she prefers to do that, and firm her current dose I think it is 600 mg.  I would prefer to go up slowly.  So we could add on 150 mg to equal 750.  I would then want her to have labs drawn in 1 week including a lithium level BMP and TSH.  She had labs drawn looks like in January or early February and the lithium level was 0.4.Let her know that both options are good, and there is no way to know if she will respond better from 1 or the other.  If she has done better with the lithium in the past, then we should choose that option.  And she would stay on the Cymbalta at the same dose.

## 2019-03-24 NOTE — Telephone Encounter (Signed)
Discussed symptoms with pt, super sweet lady. She adored Lissa Hoard and really misses him. C/o not wanting to get up in the morning, could lay in bed all day off and on sleeping. Normally she would get dressed and put her make up on, likes to look good but lately no motivation to do that. Obviously with the pandemic there's no reason to either. She said lithium is the best for her and it took her a long time to be convinced to take but it does work great. Taking cymbalta 60mg  not sure how much it works but feels like it must do something. Will do what you suggest, I did mention possibly increasing the cymbalta to 90 mg but told her I would pass everything along and get back with her to confirm.   Uses [facility] for local.

## 2019-03-25 ENCOUNTER — Other Ambulatory Visit: Payer: Self-pay

## 2019-03-25 ENCOUNTER — Other Ambulatory Visit: Payer: Self-pay | Admitting: Physician Assistant

## 2019-03-25 MED ORDER — DULOXETINE HCL 60 MG PO CPEP: 60.0000 mg | ORAL_CAPSULE | Freq: Every morning | ORAL | 0 refills | Status: DC

## 2019-03-25 MED ORDER — DULOXETINE HCL 20 MG PO CPEP: 20.0000 mg | ORAL_CAPSULE | Freq: Every day | ORAL | 0 refills | Status: DC

## 2019-03-25 NOTE — Telephone Encounter (Signed)
Perfect. Thanks.

## 2019-03-26 ENCOUNTER — Other Ambulatory Visit: Payer: Self-pay

## 2019-03-26 ENCOUNTER — Telehealth: Payer: Self-pay | Admitting: Family Medicine

## 2019-03-26 ENCOUNTER — Ambulatory Visit (INDEPENDENT_AMBULATORY_CARE_PROVIDER_SITE_OTHER): Payer: Medicare Other | Admitting: Family Medicine

## 2019-03-26 ENCOUNTER — Encounter: Payer: Self-pay | Admitting: Family Medicine

## 2019-03-26 DIAGNOSIS — I10 Essential (primary) hypertension: Secondary | ICD-10-CM | POA: Diagnosis not present

## 2019-03-26 DIAGNOSIS — E119 Type 2 diabetes mellitus without complications: Secondary | ICD-10-CM

## 2019-03-26 DIAGNOSIS — F3181 Bipolar II disorder: Secondary | ICD-10-CM

## 2019-03-26 MED ORDER — AMLODIPINE BESYLATE 5 MG PO TABS: ORAL_TABLET | ORAL | Status: DC

## 2019-03-26 NOTE — Progress Notes (Signed)
Virtual Visit via Video Note  I connected with Megan Duncan on 03/26/19 at  9:30 AM EDT by a video enabled telemedicine application and verified that I am speaking with the correct person using two identifiers.  Location: Patient: home Provider: home    I discussed the limitations of evaluation and management by telemedicine and the availability of in person appointments. The patient expressed understanding and agreed to proceed.  History of Present Illness: Pt is home -- she is seeing psych due to worsening depression symptoms but she is stable.  Her husband is supportive --- she is not suicidal Pt only needs refill on her bp med She states her sugars are running high --- she has not been eating right.   No other complaints.    Past Medical History:  Diagnosis Date  . Anxiety   . Arthritis    hands  . Bipolar II disorder ([place])    Dr. Charlott Holler  . COPD (chronic obstructive pulmonary disease) ([place])   . Depression   . Diastolic dysfunction    Per pt, diagnosed after [facility]  . Diverticulosis   . Fibromyalgia   . Hyperlipidemia   . Hypertension   . LBP (low back pain)    Current Outpatient Medications on File Prior to Visit  Medication Sig Dispense Refill  . albuterol (VENTOLIN HFA) 108 (90 Base) MCG/ACT inhaler Inhale 2 puffs into the lungs every 4 (four) hours as needed. For shortness of breath and wheezing 18 g 3  . azithromycin (ZITHROMAX) 250 MG tablet Take 2 pills today then one a day for 4 additional days 6 tablet 0  . blood glucose meter kit and supplies Dispense based on patient and insurance preference. Use once a day.  DX Code: E11.9 1 each 0  . budesonide-formoterol (SYMBICORT) 80-4.5 MCG/ACT inhaler Inhale 2 puffs into the lungs 2 (two) times daily. 1 Inhaler 0  . clorazepate (TRANXENE) 3.75 MG tablet Take 1 tablet (3.75 mg total) by mouth 2 (two) times daily as needed for anxiety. 180 tablet 1  . cyclobenzaprine (FLEXERIL) 10 MG tablet Take 1 tablet (10 mg total)  by mouth 3 (three) times daily as needed. For muscle spasms 30 tablet 0  . DULoxetine (CYMBALTA) 20 MG capsule Take 1 capsule (20 mg total) by mouth daily. With 60 mg= 80 mg. 30 capsule 0  . DULoxetine (CYMBALTA) 60 MG capsule Take 1 capsule (60 mg total) by mouth every morning. 90 capsule 0  . glucose blood test strip Use as instructed once a day.  Dx Code: E11.9 100 each 12  . hydrocortisone-pramoxine (ANALPRAM HC) 2.5-1 % rectal cream Place 1 application rectally 3 (three) times daily. (Patient taking differently: Place 1 application rectally as needed. ) 30 g 3  . Lancets MISC Use as directed once a day.  Dx code: E11.9 100 each 1  . lithium carbonate 300 MG capsule 2 capsules hs 180 capsule 0  . nystatin cream (MYCOSTATIN) Apply 1 application topically 2 (two) times daily. 30 g 0  . predniSONE (DELTASONE) 10 MG tablet Take 4 tabs for 2 days, then 3 tabs for 2 days, 2 tabs for 2 days, then 1 tab for 2 days, then stop. 20 tablet 0  . propranolol (INDERAL) 10 MG tablet Take 1 tablet (10 mg total) by mouth 2 (two) times daily. 180 tablet 0  . ranitidine (ZANTAC) 150 MG tablet Take 150 mg by mouth as needed.     . topiramate (TOPAMAX) 25 MG tablet Take 1  tablet (25 mg total) by mouth 2 (two) times daily. 60 tablet 1   No current facility-administered medications on file prior to visit.     Observations/Objective: bs running high  Pt will call with bp later --- batteries died in her cuff Pt is in NAD    Assessment and Plan: 1. Essential hypertension Well controlled, no changes to meds. Encouraged heart healthy diet such as the DASH diet and exercise as tolerated.  - amLODipine (NORVASC) 5 MG tablet; TAKE 1 TABLET BY MOUTH EVERY DAY  Dispense: 90 tablet; Refill: 01 - Lipid panel; Future - Hemoglobin A1c; Future - Comprehensive metabolic panel; Future  2. Type 2 diabetes, diet controlled ([place] Poorly controlled --- needs labs  - Lipid panel; Future - Hemoglobin A1c; Future -  Comprehensive metabolic panel; Future   Follow Up Instructions:    I discussed the assessment and treatment plan with the patient. The patient was provided an opportunity to ask questions and all were answered. The patient agreed with the plan and demonstrated an understanding of the instructions.   The patient was advised to call back or seek an in-person evaluation if the symptoms worsen or if the condition fails to improve as anticipated.  I provided 25 minutes of non-face-to-face time during this encounter.   Ann Held, DO

## 2019-03-26 NOTE — Telephone Encounter (Signed)
I SWP to set up her 6 mo f/u appt & her lab appt and during our conversation, she took her BP and wanted me to relay to you it was 110/68 @ 11:40 AM.  She said she was going to be intermittently providing you readings.

## 2019-03-26 NOTE — Telephone Encounter (Signed)
Great - thanks

## 2019-03-26 NOTE — Addendum Note (Signed)
Addended by: Kem Boroughs D on: 03/26/2019 11:40 AM   Modules accepted: Orders

## 2019-03-27 ENCOUNTER — Other Ambulatory Visit (INDEPENDENT_AMBULATORY_CARE_PROVIDER_SITE_OTHER): Payer: Medicare Other

## 2019-03-27 ENCOUNTER — Other Ambulatory Visit: Payer: Self-pay

## 2019-03-27 DIAGNOSIS — E785 Hyperlipidemia, unspecified: Secondary | ICD-10-CM

## 2019-03-27 DIAGNOSIS — I10 Essential (primary) hypertension: Secondary | ICD-10-CM | POA: Diagnosis not present

## 2019-03-27 DIAGNOSIS — E119 Type 2 diabetes mellitus without complications: Secondary | ICD-10-CM

## 2019-03-27 DIAGNOSIS — F3181 Bipolar II disorder: Secondary | ICD-10-CM | POA: Diagnosis not present

## 2019-03-27 DIAGNOSIS — E1169 Type 2 diabetes mellitus with other specified complication: Secondary | ICD-10-CM

## 2019-03-27 DIAGNOSIS — E1165 Type 2 diabetes mellitus with hyperglycemia: Secondary | ICD-10-CM

## 2019-03-27 LAB — COMPREHENSIVE METABOLIC PANEL
ALT: 13 U/L (ref 0–35)
AST: 16 U/L (ref 0–37)
Albumin: 4.4 g/dL (ref 3.5–5.2)
Alkaline Phosphatase: 80 U/L (ref 39–117)
BUN: 14 mg/dL (ref 6–23)
CO2: 25 mEq/L (ref 19–32)
Calcium: 9.5 mg/dL (ref 8.4–10.5)
Chloride: 102 mEq/L (ref 96–112)
Creatinine, Ser: 0.92 mg/dL (ref 0.40–1.20)
GFR: 59.86 mL/min — ABNORMAL LOW (ref >60.00)
Glucose, Bld: 200 mg/dL — ABNORMAL HIGH (ref 70–99)
Potassium: 4.4 mEq/L (ref 3.5–5.1)
Sodium: 139 mEq/L (ref 135–145)
Total Bilirubin: 0.7 mg/dL (ref 0.2–1.2)
Total Protein: 7 g/dL (ref 6.0–8.3)

## 2019-03-27 LAB — LIPID PANEL
Cholesterol: 206 mg/dL — ABNORMAL HIGH (ref 0–200)
HDL: 66.8 mg/dL (ref >39.00)
LDL Cholesterol: 120 mg/dL — ABNORMAL HIGH (ref 0–99)
NonHDL: 139.68
Total CHOL/HDL Ratio: 3
Triglycerides: 98 mg/dL (ref 0.0–149.0)
VLDL: 19.6 mg/dL (ref 0.0–40.0)

## 2019-03-27 LAB — TSH: TSH: 5.52 u[IU]/mL — ABNORMAL HIGH (ref 0.35–4.50)

## 2019-03-27 LAB — HEMOGLOBIN A1C: Hgb A1c MFr Bld: 6.8 % — ABNORMAL HIGH (ref 4.6–6.5)

## 2019-03-28 LAB — LITHIUM LEVEL: Lithium Lvl: 0.6 mmol/L (ref 0.6–1.2)

## 2019-03-31 ENCOUNTER — Other Ambulatory Visit: Payer: Self-pay | Admitting: Family Medicine

## 2019-03-31 DIAGNOSIS — E039 Hypothyroidism, unspecified: Secondary | ICD-10-CM

## 2019-04-02 ENCOUNTER — Other Ambulatory Visit: Payer: Self-pay | Admitting: Family Medicine

## 2019-04-02 DIAGNOSIS — E039 Hypothyroidism, unspecified: Secondary | ICD-10-CM

## 2019-04-02 MED ORDER — LEVOTHYROXINE SODIUM 25 MCG PO TABS: 25.0000 ug | ORAL_TABLET | Freq: Every day | ORAL | 2 refills | Status: DC

## 2019-04-08 NOTE — Progress Notes (Signed)
Thanks for the labs.  I have appt w/ her in a few days for f/u after increase Cymbalta.

## 2019-04-14 ENCOUNTER — Other Ambulatory Visit: Payer: Self-pay

## 2019-04-14 ENCOUNTER — Ambulatory Visit: Payer: Medicare Other | Admitting: Psychiatry

## 2019-04-14 ENCOUNTER — Encounter: Payer: Self-pay | Admitting: Physician Assistant

## 2019-04-14 ENCOUNTER — Ambulatory Visit (INDEPENDENT_AMBULATORY_CARE_PROVIDER_SITE_OTHER): Payer: Medicare Other | Admitting: Physician Assistant

## 2019-04-14 DIAGNOSIS — F3181 Bipolar II disorder: Secondary | ICD-10-CM | POA: Diagnosis not present

## 2019-04-14 DIAGNOSIS — Z79899 Other long term (current) drug therapy: Secondary | ICD-10-CM

## 2019-04-14 DIAGNOSIS — E032 Hypothyroidism due to medicaments and other exogenous substances: Secondary | ICD-10-CM | POA: Diagnosis not present

## 2019-04-14 DIAGNOSIS — F419 Anxiety disorder, unspecified: Secondary | ICD-10-CM | POA: Diagnosis not present

## 2019-04-14 MED ORDER — CLORAZEPATE DIPOTASSIUM 3.75 MG PO TABS: 3.7500 mg | ORAL_TABLET | Freq: Two times a day (BID) | ORAL | 1 refills | Status: DC | PRN

## 2019-04-14 MED ORDER — LITHIUM CARBONATE 150 MG PO CAPS: 150.0000 mg | ORAL_CAPSULE | Freq: Every day | ORAL | 1 refills | Status: DC

## 2019-04-14 NOTE — Progress Notes (Signed)
[facility]  Patient ID: Megan Duncan,  MRN: [id number]  PCP: Ann Held, DO  Date of Evaluation: 04/14/2019 Time spent:25 minutes  Chief Complaint:  Chief Complaint    Anxiety      HISTORY/CURRENT STATUS: HPI For routine med check.  Her PCP started her on Synthroid.  TSH was up up a little. TSH was 5.52 2 weeks ago.  So she just started the med.  Denies any symptoms of hypothyroidism of extreme weight gain, but is tired a lot.  She has been that way for a long time so not sure if it is related to the thyroid issue.  Lithium has been helpful.  "Wonderful. My goal has been to maintain my temper. With the Li, that's helped. If I get mad, I can be mad and not let it get the best of me."  Her only problem right now is that she has started playing a computer game and has spent a couple of thousand dollars on it.  This has happened over the past 6 months or so.  She has gotten the game off of her computer and has stopped playing it now.  Has no energy and likes to sleep a lot too. No grandiosity.  No gambling.  No risky behaviors.  No increased libido.  Does not cry easily.  Her motivation is good for her.  She likes to play bridge in [place] with friends on a regular basis.  She denies suicidal or homicidal thoughts.  Anxiety is well controlled with the Tranxene.  She does not use it every day but when she needs it it helps.  Denies dizziness, syncope, seizures, numbness, tingling, tremor, tics, unsteady gait, slurred speech, confusion. Denies muscle or joint pain, stiffness, or dystonia.  Individual Medical History/ Review of Systems: Changes? :No    Past medications for mental health diagnoses include: Trileptal Lamictal Prozac Zoloft Effexor XR, Cymbalta, Paxil, lithium, Depakote, Latuda, Risperdal, Tegretol  Allergies: Atorvastatin; Codeine; Diclofenac; Doxycycline; and Sulfonamide derivatives  Current Medications:  Current Outpatient Medications:  .   albuterol (VENTOLIN HFA) 108 (90 Base) MCG/ACT inhaler, Inhale 2 puffs into the lungs every 4 (four) hours as needed. For shortness of breath and wheezing, Disp: 18 g, Rfl: 3 .  amLODipine (NORVASC) 5 MG tablet, TAKE 1 TABLET BY MOUTH EVERY DAY, Disp: 90 tablet, Rfl: 01 .  blood glucose meter kit and supplies, Dispense based on patient and insurance preference. Use once a day.  DX Code: E11.9, Disp: 1 each, Rfl: 0 .  budesonide-formoterol (SYMBICORT) 80-4.5 MCG/ACT inhaler, Inhale 2 puffs into the lungs 2 (two) times daily., Disp: 1 Inhaler, Rfl: 0 .  clorazepate (TRANXENE) 3.75 MG tablet, Take 1 tablet (3.75 mg total) by mouth 2 (two) times daily as needed for anxiety., Disp: 180 tablet, Rfl: 1 .  DULoxetine (CYMBALTA) 20 MG capsule, Take 1 capsule (20 mg total) by mouth daily. With 60 mg= 80 mg., Disp: 30 capsule, Rfl: 0 .  DULoxetine (CYMBALTA) 60 MG capsule, Take 1 capsule (60 mg total) by mouth every morning., Disp: 90 capsule, Rfl: 0 .  glucose blood test strip, Use as instructed once a day.  Dx Code: E11.9, Disp: 100 each, Rfl: 12 .  Lancets MISC, Use as directed once a day.  Dx code: E11.9, Disp: 100 each, Rfl: 1 .  levothyroxine (SYNTHROID) 25 MCG tablet, Take 1 tablet (25 mcg total) by mouth daily before breakfast., Disp: 30 tablet, Rfl: 2 .  lithium carbonate 300 MG  capsule, 2 capsules hs, Disp: 180 capsule, Rfl: 0 .  propranolol (INDERAL) 10 MG tablet, Take 1 tablet (10 mg total) by mouth 2 (two) times daily., Disp: 180 tablet, Rfl: 0 .  azithromycin (ZITHROMAX) 250 MG tablet, Take 2 pills today then one a day for 4 additional days (Patient not taking: Reported on 04/14/2019), Disp: 6 tablet, Rfl: 0 .  cyclobenzaprine (FLEXERIL) 10 MG tablet, Take 1 tablet (10 mg total) by mouth 3 (three) times daily as needed. For muscle spasms (Patient not taking: Reported on 04/14/2019), Disp: 30 tablet, Rfl: 0 .  hydrocortisone-pramoxine (ANALPRAM HC) 2.5-1 % rectal cream, Place 1 application rectally 3  (three) times daily. (Patient taking differently: Place 1 application rectally as needed. ), Disp: 30 g, Rfl: 3 .  lithium carbonate 150 MG capsule, Take 1 capsule (150 mg total) by mouth daily., Disp: 30 capsule, Rfl: 1 .  nystatin cream (MYCOSTATIN), Apply 1 application topically 2 (two) times daily., Disp: 30 g, Rfl: 0 .  predniSONE (DELTASONE) 10 MG tablet, Take 4 tabs for 2 days, then 3 tabs for 2 days, 2 tabs for 2 days, then 1 tab for 2 days, then stop. (Patient not taking: Reported on 04/14/2019), Disp: 20 tablet, Rfl: 0 .  ranitidine (ZANTAC) 150 MG tablet, Take 150 mg by mouth as needed. , Disp: , Rfl:  .  topiramate (TOPAMAX) 25 MG tablet, Take 1 tablet (25 mg total) by mouth 2 (two) times daily. (Patient not taking: Reported on 04/14/2019), Disp: 60 tablet, Rfl: 1 Medication Side Effects: none  Family Medical/ Social History: Changes? No  MENTAL HEALTH EXAM:  There were no vitals taken for this visit.There is no height or weight on file to calculate BMI.  General Appearance: Casual and Obese  Eye Contact:  Good  Speech:  Clear and Coherent  Volume:  Normal  Mood:  Euthymic  Affect:  Appropriate  Thought Process:  Goal Directed  Orientation:  Full (Time, Place, and Person)  Thought Content: Logical   Suicidal Thoughts:  No  Homicidal Thoughts:  No  Memory:  WNL  Judgement:  Good  Insight:  Good  Psychomotor Activity:  Normal  Concentration:  Concentration: Good  Recall:  Good  Fund of Knowledge: Good  Language: Good  Assets:  Desire for Improvement  ADL's:  Intact  Cognition: WNL  Prognosis:  Good  03/27/19 Lithium 0.6, BUN 14, Cr 0.92, Gluc 200, Hgb A1C 6.8, TSH 5.52  DIAGNOSES:    ICD-10-CM   1. Encounter for long-term (current) use of medications Z79.899 Lithium level  2. Bipolar 2 disorder ([place]) F31.81   3. Anxiety F41.9   4. 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  We discussed the risk benefit ratio and that she has had so much benefit from the lithium that it would not make sense to go off the lithium in order to prevent the hypothyroidism.  That can easily be treated with the Synthroid.  Her PCP is following that right now. Since her lithium level was within low normal range, we would like for it to be 0.8-1.2 if not stable which she is not completely, I recommend that we increase the lithium slightly.  She verbalizes understanding. Start lithium 150 mg and add to Li 300 mg, 2 qhs. = 797m qhs  Obtain lithium level in approximately 10 days. Continue Tranxene  3.75 mg 1 twice daily as needed. Continue Cymbalta 60 mg +20 mg daily. Continue Synthroid 25 mcg every morning. Continue propranolol 10 mg 1 twice daily. Return in 4 weeks.  Donnal Moat, PA-C   This record has been created using [employer].  Chart creation errors have been sought, but may not always have been located and corrected. Such creation errors do not reflect on the standard of medical care.

## 2019-04-16 DIAGNOSIS — E119 Type 2 diabetes mellitus without complications: Secondary | ICD-10-CM | POA: Diagnosis not present

## 2019-04-16 DIAGNOSIS — H2513 Age-related nuclear cataract, bilateral: Secondary | ICD-10-CM | POA: Diagnosis not present

## 2019-04-16 LAB — HM DIABETES EYE EXAM

## 2019-05-12 ENCOUNTER — Ambulatory Visit: Payer: Medicare Other | Admitting: Physician Assistant

## 2019-06-02 ENCOUNTER — Other Ambulatory Visit: Payer: Self-pay

## 2019-06-02 ENCOUNTER — Other Ambulatory Visit (INDEPENDENT_AMBULATORY_CARE_PROVIDER_SITE_OTHER): Payer: Medicare Other

## 2019-06-02 DIAGNOSIS — E039 Hypothyroidism, unspecified: Secondary | ICD-10-CM

## 2019-06-03 LAB — THYROID PANEL WITH TSH
Free Thyroxine Index: 1.7 (ref 1.4–3.8)
T3 Uptake: 25 % (ref 22–35)
T4, Total: 6.7 ug/dL (ref 5.1–11.9)
TSH: 3.48 mIU/L (ref 0.40–4.50)

## 2019-06-08 ENCOUNTER — Other Ambulatory Visit: Payer: Self-pay | Admitting: Physician Assistant

## 2019-06-09 DIAGNOSIS — M545 Low back pain: Secondary | ICD-10-CM | POA: Diagnosis not present

## 2019-06-11 ENCOUNTER — Ambulatory Visit (INDEPENDENT_AMBULATORY_CARE_PROVIDER_SITE_OTHER): Payer: Medicare Other | Admitting: Physician Assistant

## 2019-06-11 ENCOUNTER — Encounter: Payer: Self-pay | Admitting: Physician Assistant

## 2019-06-11 ENCOUNTER — Other Ambulatory Visit: Payer: Self-pay

## 2019-06-11 DIAGNOSIS — F3181 Bipolar II disorder: Secondary | ICD-10-CM

## 2019-06-11 DIAGNOSIS — F419 Anxiety disorder, unspecified: Secondary | ICD-10-CM

## 2019-06-11 NOTE — Progress Notes (Signed)
[facility]  Patient ID: Megan Duncan,  MRN: [id number]  PCP: Ann Held, DO  Date of Evaluation: 06/11/2019 Time spent:15 minutes  Chief Complaint:  Chief Complaint    Follow-up     Virtual Visit via Telephone Note  I connected with patient by a video enabled telemedicine application or telephone, with their informed consent, and verified patient privacy and that I am speaking with the correct person using two identifiers.  I am private, in my home and the patient is home.   I discussed the limitations, risks, security and privacy concerns of performing an evaluation and management service by telephone and the availability of in person appointments. I also discussed with the patient that there may be a patient responsible charge related to this service. The patient expressed understanding and agreed to proceed.   I discussed the assessment and treatment plan with the patient. The patient was provided an opportunity to ask questions and all were answered. The patient agreed with the plan and demonstrated an understanding of the instructions.   The patient was advised to call back or seek an in-person evaluation if the symptoms worsen or if the condition fails to improve as anticipated.  I provided 15 minutes of non-face-to-face time during this encounter.  HISTORY/CURRENT STATUS: HPI For routine med f/u.  Having a lot of back pain from sciatica. Trying to get that healed up.  Also has spinal stenosis.  She forgot to get her Lithium level drawn.  Feels good mentally.  Still wants to sleep a lot at times. But feels that meds are at good doses.  She is able to enjoy things and energy and motivation are good most of the time.  She is not isolating.  She does not cry easily.  No suicidal or homicidal thoughts.  Reports that she reached her goal when she was diagnosed with bipolar disorder and her main symptoms were irritability and anger.  States that she no  longer has that on a regular basis for no reason.  States she still gets angry at times but it is triggered by something and she believes is justifiable.  She denies increased energy with decreased need for sleep.  No impulsivity, risky behavior, increased spending or grandiosity.  Reports having anxiety at times but Tranxene does help.  She does not use it often.  She occasionally will have restless leg type symptoms and the Tranxene helps that.  It does not occur often.  Denies dizziness, syncope, seizures, numbness, tingling, tremor, tics, unsteady gait, slurred speech, confusion. Denies muscle or joint pain, stiffness, or dystonia.  Individual Medical History/ Review of Systems: Changes? :No  sciatica.  Seeing PCP for that.  Recent TSH was normal.  See results on chart.  Allergies: Atorvastatin, Codeine, Diclofenac, Doxycycline, and Sulfonamide derivatives  Current Medications:  Current Outpatient Medications:  .  albuterol (VENTOLIN HFA) 108 (90 Base) MCG/ACT inhaler, Inhale 2 puffs into the lungs every 4 (four) hours as needed. For shortness of breath and wheezing, Disp: 18 g, Rfl: 3 .  amLODipine (NORVASC) 5 MG tablet, TAKE 1 TABLET BY MOUTH EVERY DAY, Disp: 90 tablet, Rfl: 01 .  blood glucose meter kit and supplies, Dispense based on patient and insurance preference. Use once a day.  DX Code: E11.9, Disp: 1 each, Rfl: 0 .  clorazepate (TRANXENE) 3.75 MG tablet, Take 1 tablet (3.75 mg total) by mouth 2 (two) times daily as needed for anxiety., Disp: 180 tablet, Rfl: 1 .  DULoxetine (CYMBALTA) 20 MG capsule, Take 1 capsule (20 mg total) by mouth daily. With 60 mg= 80 mg., Disp: 30 capsule, Rfl: 0 .  DULoxetine (CYMBALTA) 60 MG capsule, Take 1 capsule (60 mg total) by mouth every morning., Disp: 90 capsule, Rfl: 0 .  glucose blood test strip, Use as instructed once a day.  Dx Code: E11.9, Disp: 100 each, Rfl: 12 .  hydrocortisone-pramoxine (ANALPRAM HC) 2.5-1 % rectal cream, Place 1  application rectally 3 (three) times daily. (Patient taking differently: Place 1 application rectally as needed. ), Disp: 30 g, Rfl: 3 .  Lancets MISC, Use as directed once a day.  Dx code: E11.9, Disp: 100 each, Rfl: 1 .  levothyroxine (SYNTHROID) 25 MCG tablet, Take 1 tablet (25 mcg total) by mouth daily before breakfast., Disp: 30 tablet, Rfl: 2 .  lithium carbonate 150 MG capsule, TAKE 1 CAPSULE BY MOUTH EVERY DAY, Disp: 30 capsule, Rfl: 1 .  lithium carbonate 300 MG capsule, 2 capsules hs, Disp: 180 capsule, Rfl: 0 .  nystatin cream (MYCOSTATIN), Apply 1 application topically 2 (two) times daily., Disp: 30 g, Rfl: 0 .  predniSONE (DELTASONE) 10 MG tablet, Take 4 tabs for 2 days, then 3 tabs for 2 days, 2 tabs for 2 days, then 1 tab for 2 days, then stop., Disp: 20 tablet, Rfl: 0 .  propranolol (INDERAL) 10 MG tablet, Take 1 tablet (10 mg total) by mouth 2 (two) times daily., Disp: 180 tablet, Rfl: 0 .  tiZANidine (ZANAFLEX) 4 MG capsule, , Disp: , Rfl:  .  traMADol (ULTRAM) 50 MG tablet, , Disp: , Rfl:  .  azithromycin (ZITHROMAX) 250 MG tablet, Take 2 pills today then one a day for 4 additional days (Patient not taking: Reported on 04/14/2019), Disp: 6 tablet, Rfl: 0 .  budesonide-formoterol (SYMBICORT) 80-4.5 MCG/ACT inhaler, Inhale 2 puffs into the lungs 2 (two) times daily. (Patient not taking: Reported on 06/11/2019), Disp: 1 Inhaler, Rfl: 0 .  cyclobenzaprine (FLEXERIL) 10 MG tablet, Take 1 tablet (10 mg total) by mouth 3 (three) times daily as needed. For muscle spasms (Patient not taking: Reported on 04/14/2019), Disp: 30 tablet, Rfl: 0 .  ranitidine (ZANTAC) 150 MG tablet, Take 150 mg by mouth as needed. , Disp: , Rfl:  .  topiramate (TOPAMAX) 25 MG tablet, Take 1 tablet (25 mg total) by mouth 2 (two) times daily. (Patient not taking: Reported on 04/14/2019), Disp: 60 tablet, Rfl: 1 Medication Side Effects: none  Family Medical/ Social History: Changes? No  MENTAL HEALTH EXAM:  There were  no vitals taken for this visit.There is no height or weight on file to calculate BMI.  General Appearance: unable to assess  Eye Contact:  unable to assess  Speech:  Clear and Coherent  Volume:  Normal  Mood:  Euthymic  Affect:  unable to assess  Thought Process:  Goal Directed  Orientation:  Full (Time, Place, and Person)  Thought Content: Logical   Suicidal Thoughts:  No  Homicidal Thoughts:  No  Memory:  WNL  Judgement:  Good  Insight:  Good  Psychomotor Activity:  unable to assess  Concentration:  Concentration: Good  Recall:  Good  Fund of Knowledge: Good  Language: Good  Assets:  Desire for Improvement  ADL's:  Intact  Cognition: WNL  Prognosis:  Good    DIAGNOSES:    ICD-10-CM   1. Bipolar 2 disorder ([facility])  F31.81   2. Anxiety  F41.9     Receiving Psychotherapy:  No    RECOMMENDATIONS:  Continue Li 300 mg, 2 p.o. +150 mg nightly to equal 750 mg daily. Continue Tranxene 3.75 mg twice daily as needed. Continue Cymbalta 20 mg +60 mg equals 80 mg daily. Continue propranolol 10 mg twice daily. Continue Synthroid 25 mcg every morning. Stressed importance of having lithium level drawn.  She has a physical with labs to be done within the next week and will asked to have that done then.  If it is not possible to have that drawn in her PCPs office then she can let me know and I will reorder the lab. Return in 3 months.  Donnal Moat, PA-C   This record has been created using [employer].  Chart creation errors have been sought, but may not always have been located and corrected. Such creation errors do not reflect on the standard of medical care.

## 2019-06-12 ENCOUNTER — Ambulatory Visit: Payer: Medicare Other | Admitting: Family Medicine

## 2019-06-15 ENCOUNTER — Other Ambulatory Visit: Payer: Self-pay | Admitting: Family Medicine

## 2019-06-17 NOTE — Progress Notes (Signed)
Virtual Visit via Video Note  I connected with patient on 06/19/19 at 11:00 AM EDT by audio enabled telemedicine application and verified that I am speaking with the correct person using two identifiers.   THIS ENCOUNTER IS A VIRTUAL VISIT DUE TO COVID-19 - PATIENT WAS NOT SEEN IN THE OFFICE. PATIENT HAS CONSENTED TO VIRTUAL VISIT / TELEMEDICINE VISIT   Location of patient: home  Location of provider: office  I discussed the limitations of evaluation and management by telemedicine and the availability of in person appointments. The patient expressed understanding and agreed to proceed.    Subjective:   Megan Duncan is a 73 y.o. female who presents for Medicare Annual (Subsequent) preventive examination.  Review of Systems: No ROS.  Medicare Wellness Virtual Visit.  Visual/audio telehealth visit, UTA vital signs.   See social history for additional risk factors. Cardiac Risk Factors include: advanced age (>56mn, >>73women);diabetes mellitus;dyslipidemia;hypertension Home Safety/Smoke Alarms: Feels safe in home. Smoke alarms in place.  Lives with husband in 2 story home. Handrails on stairs.   Female:         Mammo-07/01/18       Dexa scan-  09/11/17      CCS- 07/16/12.    Objective:    Advanced Directives 06/19/2019 06/17/2018 01/10/2017 04/07/2015  Does Patient Have a Medical Advance Directive? Yes No No Yes  Type of [place]amed[place] - - [place]  Does patient want to make changes to medical advance directive? No - Patient declined - - No - Patient declined  Copy of [place] Chart? No - copy requested - - No - copy requested  Would patient like information on creating a medical advance directive? - No - Patient declined No - Patient declined -    Tobacco Social History   Tobacco Use  Smoking Status Former Smoker  . Packs/day: 1.50  . Years: 40.00  . Pack years: 60.00  . Quit  date: 07/02/2004  . Years since quitting: 14.9  Smokeless Tobacco Never Used     Counseling given: Not Answered   Clinical Intake:     Pain : No/denies pain                 Past Medical History:  Diagnosis Date  . Anxiety   . Arthritis    hands  . Bipolar II disorder ([place]    Dr. C[place]ott Holler . COPD (chronic obstructive pulmonary disease) ([place]   . Depression   . Diastolic dysfunction    [facility] . Diverticulosis   . Fibromyalgia   . Hyperlipidemia   . Hypertension   . LBP (low back pain)    Past Surgical History:  Procedure Laterality Date  . APPENDECTOMY    . KNEE ARTHROSCOPY Right 2008  . TONSILLECTOMY    . TOTAL ABDOMINAL HYSTERECTOMY     Family History  Problem Relation Age of Onset  . Colon cancer Maternal Aunt 80  . Diabetes Father   . Hypertension Father   . Heart attack Father   . Alcoholism Father   . Hypertension Mother   . Hiatal hernia Mother   . Mental illness Sister        suicide  . Breast cancer Maternal Aunt    Social History   Socioeconomic History  . Marital status: Married    Spouse name: Not on file  . Number of children: 3  . Years  of education: Not on file  . Highest education level: Not on file  Occupational History  . Occupation: retired  Scientific laboratory technician  . Financial resource strain: Not on file  . Food insecurity    Worry: Not on file    Inability: Not on file  . Transportation needs    Medical: Not on file    Non-medical: Not on file  Tobacco Use  . Smoking status: Former Smoker    Packs/day: 1.50    Years: 40.00    Pack years: 60.00    Quit date: 07/02/2004    Years since quitting: 14.9  . Smokeless tobacco: Never Used  Substance and Sexual Activity  . Alcohol use: Yes    Alcohol/week: 0.0 - 1.0 standard drinks    Comment: occasional wine  . Drug use: No  . Sexual activity: Never  Lifestyle  . Physical activity    Days per week: Not on file    Minutes per session: Not on file  .  Stress: Not on file  Relationships  . Social Herbalist on phone: Not on file    Gets together: Not on file    Attends religious service: Not on file    Active member of club or organization: Not on file    Attends meetings of clubs or organizations: Not on file    Relationship status: Not on file  Other Topics Concern  . Not on file  Social History Narrative   Lives with husband and grandson she is raising.    Outpatient Encounter Medications as of 06/19/2019  Medication Sig  . albuterol (VENTOLIN HFA) 108 (90 Base) MCG/ACT inhaler Inhale 2 puffs into the lungs every 4 (four) hours as needed. For shortness of breath and wheezing  . amLODipine (NORVASC) 5 MG tablet TAKE 1 TABLET BY MOUTH EVERY DAY  . blood glucose meter kit and supplies Dispense based on patient and insurance preference. Use once a day.  DX Code: E11.9  . budesonide-formoterol (SYMBICORT) 80-4.5 MCG/ACT inhaler Inhale 2 puffs into the lungs 2 (two) times daily.  . clorazepate (TRANXENE) 3.75 MG tablet Take 1 tablet (3.75 mg total) by mouth 2 (two) times daily as needed for anxiety.  . DULoxetine (CYMBALTA) 20 MG capsule Take 1 capsule (20 mg total) by mouth daily. With 60 mg= 80 mg.  . DULoxetine (CYMBALTA) 60 MG capsule Take 1 capsule (60 mg total) by mouth every morning.  Marland Kitchen glucose blood test strip Use as instructed once a day.  Dx Code: E11.9  . hydrocortisone-pramoxine (ANALPRAM HC) 2.5-1 % rectal cream Place 1 application rectally 3 (three) times daily. (Patient taking differently: Place 1 application rectally as needed. )  . Lancets MISC Use as directed once a day.  Dx code: E11.9  . levothyroxine (SYNTHROID) 25 MCG tablet TAKE 1 TABLET (25 MCG TOTAL) BY MOUTH DAILY BEFORE BREAKFAST.  Marland Kitchen lithium carbonate 150 MG capsule TAKE 1 CAPSULE BY MOUTH EVERY DAY  . lithium carbonate 300 MG capsule 2 capsules hs  . nystatin cream (MYCOSTATIN) Apply 1 application topically 2 (two) times daily.  . predniSONE  (DELTASONE) 10 MG tablet Take 4 tabs for 2 days, then 3 tabs for 2 days, 2 tabs for 2 days, then 1 tab for 2 days, then stop.  . propranolol (INDERAL) 10 MG tablet Take 1 tablet (10 mg total) by mouth 2 (two) times daily.  . ranitidine (ZANTAC) 150 MG tablet Take 150 mg by mouth as needed.   Marland Kitchen  tiZANidine (ZANAFLEX) 4 MG capsule   . topiramate (TOPAMAX) 25 MG tablet Take 1 tablet (25 mg total) by mouth 2 (two) times daily.  . traMADol (ULTRAM) 50 MG tablet   . cyclobenzaprine (FLEXERIL) 10 MG tablet Take 1 tablet (10 mg total) by mouth 3 (three) times daily as needed. For muscle spasms (Patient not taking: Reported on 04/14/2019)  . [DISCONTINUED] azithromycin (ZITHROMAX) 250 MG tablet Take 2 pills today then one a day for 4 additional days (Patient not taking: Reported on 04/14/2019)   No facility-administered encounter medications on file as of 06/19/2019.     Activities of Daily Living In your present state of health, do you have any difficulty performing the following activities: 06/19/2019  Hearing? N  Vision? N  Difficulty concentrating or making decisions? N  Walking or climbing stairs? N  Dressing or bathing? N  Doing errands, shopping? N  Preparing Food and eating ? N  Using the Toilet? N  In the past six months, have you accidently leaked urine? N  Do you have problems with loss of bowel control? N  Managing your Medications? N  Managing your Finances? N  Housekeeping or managing your Housekeeping? N  Some recent data might be hidden    Patient Care Team: Carollee Herter, Alferd Apa, DO as PCP - General (Family Medicine) Luz Brazen as Physician Assistant (Physician Assistant) Milus Banister, MD as Attending Physician (Gastroenterology)    Assessment:   This is a routine wellness examination for Megan Duncan. Physical assessment deferred to PCP.  Exercise Activities and Dietary recommendations Current Exercise Habits: The patient does not participate in regular exercise at  present, Exercise limited by: orthopedic condition(s) Diet (meal preparation, eat out, water intake, caffeinated beverages, dairy products, fruits and vegetables): well balanced, on average, 3 meals per day      Goals    . Continue eating a healthy diet    . Increase walking to 3 days a week for 30 minutes       Fall Risk Fall Risk  06/19/2019 06/17/2018 06/13/2018 01/10/2017 10/18/2015  Falls in the past year? 1 No Yes No No  Comment - - Emmi Telephone Survey: data to providers prior to load - -  Number falls in past yr: 0 - 1 - -  Comment - - Emmi Telephone Survey Actual Response = 1 - -  Injury with Fall? 0 - No - -  Follow up Education provided;Falls prevention discussed - - - -     Depression Screen PHQ 2/9 Scores 06/19/2019 06/17/2018 01/10/2017 10/18/2015  PHQ - 2 Score 0 1 6 0  PHQ- 9 Score - - 16 -  Exception Documentation - - - Patient refusal     Cognitive Function Ad8 score reviewed for issues:  Issues making decisions:no  Less interest in hobbies / activities:no  Repeats questions, stories (family complaining):no  Trouble using ordinary gadgets (microwave, computer, phone):no  Forgets the month or year: no  Mismanaging finances: no  Remembering appts:no  Daily problems with thinking and/or memory:no Ad8 score is=0    MMSE - Mini Mental State Exam 01/10/2017  Orientation to time 5  Orientation to Place 5  Registration 3  Attention/ Calculation 4  Recall 3  Language- name 2 objects 2  Language- repeat 1  Language- follow 3 step command 3  Language- read & follow direction 1  Write a sentence 1  Copy design 1  Total score 29        Immunization History  Administered Date(s) Administered  . Influenza Whole 08/17/2009, 08/09/2010, 09/24/2012, 08/11/2013  . Influenza, High Dose Seasonal PF 09/12/2016, 08/30/2017, 09/23/2018  . Influenza,inj,Quad PF,6+ Mos 07/15/2014, 08/01/2015  . Pneumococcal Conjugate-13 07/15/2014  . Pneumococcal Polysaccharide-23  08/01/2015  . Zoster 09/24/2012   Screening Tests Health Maintenance  Topic Date Due  . TETANUS/TDAP  05[birth year]05/1965  . COLONOSCOPY  07/17/2015  . INFLUENZA VACCINE  06/13/2019  . URINE MICROALBUMIN  07/09/2019  . MAMMOGRAM  07/02/2019  . HEMOGLOBIN A1C  09/27/2019  . FOOT EXAM  12/13/2019  . OPHTHALMOLOGY EXAM  04/15/2020  . DEXA SCAN  Completed  . Hepatitis C Screening  Completed  . PNA vac Low Risk Adult  Completed       Plan:   See you next year!  Continue to eat heart healthy diet (full of fruits, vegetables, whole grains, lean protein, water--limit salt, fat, and sugar intake) and increase physical activity as tolerated.  Continue doing brain stimulating activities (puzzles, reading, adult coloring books, staying active) to keep memory sharp.   Bring a copy of your living will and/or healthcare power of attorney to your next office visit.   I have personally reviewed and noted the following in the patient's chart:   . Medical and social history . Use of alcohol, tobacco or illicit drugs  . Current medications and supplements . Functional ability and status . Nutritional status . Physical activity . Advanced directives . List of other physicians . Hospitalizations, surgeries, and ER visits in previous 12 months . Vitals . Screenings to include cognitive, depression, and falls . Referrals and appointments  In addition, I have reviewed and discussed with patient certain preventive protocols, quality metrics, and best practice recommendations. A written personalized care plan for preventive services as well as general preventive health recommendations were provided to patient.     Shela Nevin, [place]  06/19/2019

## 2019-06-19 ENCOUNTER — Encounter: Payer: Self-pay | Admitting: *Deleted

## 2019-06-19 ENCOUNTER — Other Ambulatory Visit: Payer: Self-pay

## 2019-06-19 ENCOUNTER — Ambulatory Visit (INDEPENDENT_AMBULATORY_CARE_PROVIDER_SITE_OTHER): Payer: Medicare Other | Admitting: *Deleted

## 2019-06-19 DIAGNOSIS — Z0000 Encounter for general adult medical examination without abnormal findings: Secondary | ICD-10-CM

## 2019-06-19 NOTE — Patient Instructions (Signed)
See you next year!  Continue to eat heart healthy diet (full of fruits, vegetables, whole grains, lean protein, water--limit salt, fat, and sugar intake) and increase physical activity as tolerated.  Continue doing brain stimulating activities (puzzles, reading, adult coloring books, staying active) to keep memory sharp.   Bring a copy of your living will and/or healthcare power of attorney to your next office visit.   Megan Duncan , Thank you for taking time to come for your Medicare Wellness Visit. I appreciate your ongoing commitment to your health goals. Please review the following plan we discussed and let me know if I can assist you in the future.   These are the goals we discussed: Goals    . Continue eating a healthy diet    . Increase walking to 3 days a week for 30 minutes       This is a list of the screening recommended for you and due dates:  Health Maintenance  Topic Date Due  . Tetanus Vaccine  05/05/1965  . Colon Cancer Screening  07/17/2015  . Flu Shot  06/13/2019  . Urine Protein Check  07/09/2019  . Mammogram  07/02/2019  . Hemoglobin A1C  09/27/2019  . Complete foot exam   12/13/2019  . Eye exam for diabetics  04/15/2020  . DEXA scan (bone density measurement)  Completed  .  Hepatitis C: One time screening is recommended by Center for Disease Control  (CDC) for  adults born from [birth year] through 1965.   Completed  . Pneumonia vaccines  Completed    Health Maintenance After Age 41 After age 10, you are at a higher risk for certain long-term diseases and infections as well as injuries from falls. Falls are a major cause of broken bones and head injuries in people who are older than age 92. Getting regular preventive care can help to keep you healthy and well. Preventive care includes getting regular testing and making lifestyle changes as recommended by your health care provider. Talk with your health care provider about:  Which screenings and tests you should have.  A screening is a test that checks for a disease when you have no symptoms.  A diet and exercise plan that is right for you. What should I know about screenings and tests to prevent falls? Screening and testing are the best ways to find a health problem early. Early diagnosis and treatment give you the best chance of managing medical conditions that are common after age 55. Certain conditions and lifestyle choices may make you more likely to have a fall. Your health care provider may recommend:  Regular vision checks. Poor vision and conditions such as cataracts can make you more likely to have a fall. If you wear glasses, make sure to get your prescription updated if your vision changes.  Medicine review. Work with your health care provider to regularly review all of the medicines you are taking, including over-the-counter medicines. Ask your health care provider about any side effects that may make you more likely to have a fall. Tell your health care provider if any medicines that you take make you feel dizzy or sleepy.  Osteoporosis screening. Osteoporosis is a condition that causes the bones to get weaker. This can make the bones weak and cause them to break more easily.  Blood pressure screening. Blood pressure changes and medicines to control blood pressure can make you feel dizzy.  Strength and balance checks. Your health care provider may recommend certain tests to  check your strength and balance while standing, walking, or changing positions.  Foot health exam. Foot pain and numbness, as well as not wearing proper footwear, can make you more likely to have a fall.  Depression screening. You may be more likely to have a fall if you have a fear of falling, feel emotionally low, or feel unable to do activities that you used to do.  Alcohol use screening. Using too much alcohol can affect your balance and may make you more likely to have a fall. What actions can I take to lower my risk of  falls? General instructions  Talk with your health care provider about your risks for falling. Tell your health care provider if: ? You fall. Be sure to tell your health care provider about all falls, even ones that seem minor. ? You feel dizzy, sleepy, or off-balance.  Take over-the-counter and prescription medicines only as told by your health care provider. These include any supplements.  Eat a healthy diet and maintain a healthy weight. A healthy diet includes low-fat dairy products, low-fat (lean) meats, and fiber from whole grains, beans, and lots of fruits and vegetables. Home safety  Remove any tripping hazards, such as rugs, cords, and clutter.  Install safety equipment such as grab bars in bathrooms and safety rails on stairs.  Keep rooms and walkways well-lit. Activity   Follow a regular exercise program to stay fit. This will help you maintain your balance. Ask your health care provider what types of exercise are appropriate for you.  If you need a cane or walker, use it as recommended by your health care provider.  Wear supportive shoes that have nonskid soles. Lifestyle  Do not drink alcohol if your health care provider tells you not to drink.  If you drink alcohol, limit how much you have: ? 0-1 drink a day for women. ? 0-2 drinks a day for men.  Be aware of how much alcohol is in your drink. In the [place] one drink equals one typical bottle of beer (12 oz), one-half glass of wine (5 oz), or one shot of hard liquor (1 oz).  Do not use any products that contain nicotine or tobacco, such as cigarettes and e-cigarettes. If you need help quitting, ask your health care provider. Summary  Having a healthy lifestyle and getting preventive care can help to protect your health and wellness after age 53.  Screening and testing are the best way to find a health problem early and help you avoid having a fall. Early diagnosis and treatment give you the best chance for  managing medical conditions that are more common for people who are older than age 72.  Falls are a major cause of broken bones and head injuries in people who are older than age 75. Take precautions to prevent a fall at home.  Work with your health care provider to learn what changes you can make to improve your health and wellness and to prevent falls. This information is not intended to replace advice given to you by your health care provider. Make sure you discuss any questions you have with your health care provider. Document Released: 09/11/2017 Document Revised: 02/19/2019 Document Reviewed: 09/11/2017 Elsevier Patient Education  2020 [employer].

## 2019-06-22 ENCOUNTER — Telehealth: Payer: Self-pay

## 2019-06-22 NOTE — Telephone Encounter (Signed)
She needs ov 

## 2019-06-22 NOTE — Telephone Encounter (Signed)
Please advise 

## 2019-06-22 NOTE — Telephone Encounter (Signed)
Copied from [place] [id number]. Topic: General - Other >> Jun 22, 2019  9:23 AM Celene Kras A wrote: Reason for CRM: Pt called stating she is still experiencing sciatica pain left hip, down the leg, and in the ankle. Pt states she cant walk well. Pt states she has been on predinisone for 12 days. Pt states the tramadol did not help. Pt states she went to an orthopedic doctor for this diagnoses. Pt states she would like to not go back if possible. Pt states she would like to speak with [place] because she told her to call back if the pain was still happening today. Please advise.

## 2019-06-23 NOTE — Telephone Encounter (Signed)
Please schedule pt for a OV.

## 2019-06-24 ENCOUNTER — Ambulatory Visit (INDEPENDENT_AMBULATORY_CARE_PROVIDER_SITE_OTHER): Payer: Medicare Other | Admitting: Family Medicine

## 2019-06-24 ENCOUNTER — Encounter: Payer: Self-pay | Admitting: Family Medicine

## 2019-06-24 ENCOUNTER — Other Ambulatory Visit: Payer: Self-pay

## 2019-06-24 ENCOUNTER — Ambulatory Visit: Payer: Medicare Other | Admitting: Family Medicine

## 2019-06-24 DIAGNOSIS — M5440 Lumbago with sciatica, unspecified side: Secondary | ICD-10-CM | POA: Diagnosis not present

## 2019-06-24 DIAGNOSIS — M545 Low back pain, unspecified: Secondary | ICD-10-CM

## 2019-06-24 MED ORDER — TRAMADOL HCL 50 MG PO TABS: ORAL_TABLET | ORAL | 1 refills | Status: DC

## 2019-06-24 NOTE — Telephone Encounter (Signed)
Appt scheduled

## 2019-06-24 NOTE — Progress Notes (Deleted)
Virtual Visit via Video Note  I connected with Megan Duncan on 06/24/19 at  9:20 AM EDT by a video enabled telemedicine application and verified that I am speaking with the correct person using two identifiers.  Location: Patient: [place]  Provider: home    I discussed the limitations of evaluation and management by telemedicine and the availability of in person appointments. The patient expressed understanding and agreed to proceed.  History of Present Illness: Pt is home c/o back pain that radiates down her leg.  She saw ortho and was given prednisone and tramadol ---- is not working    She is still hurting.  Her left leg is weak and hurts     Observations/Objective:   Assessment and Plan:   Follow Up Instructions:    I discussed the assessment and treatment plan with the patient. The patient was provided an opportunity to ask questions and all were answered. The patient agreed with the plan and demonstrated an understanding of the instructions.   The patient was advised to call back or seek an in-person evaluation if the symptoms worsen or if the condition fails to improve as anticipated.  I provided 15 minutes of non-face-to-face time during this encounter.   Ann Held, DO

## 2019-06-24 NOTE — Progress Notes (Signed)
Virtual Visit via Video Note  I connected with Megan Duncan on 06/24/19 at 10:40 AM EDT by a video enabled telemedicine application and verified that I am speaking with the correct person using two identifiers.  Location: Patient: home  Provider: home    I discussed the limitations of evaluation and management by telemedicine and the availability of in person appointments. The patient expressed understanding and agreed to proceed.  History of Present Illness: Pt is home c/o low back pain that radiates down L leg to foot.  She saw ortho and was given pred taper and ultram with no relief.  She was not happy there.  Pain is no better -- it has been several weeks.   Pt fell several weeks ago and that is when it started.  She feels her L leg is weak.  xrays were done at [facility].     Observations/Objective: No vitals obtained Pt is in pain but in NAD   Assessment and Plan: 1. Low back pain with radiation Check mri con't tramadol -- take 2 if needed and muscle relaxer  Ice/ heat , rest  - MR Lumbar Spine Wo Contrast; Future - traMADol (ULTRAM) 50 MG tablet; 1-2 po q6h prn  Dispense: 60 tablet; Refill: 1  Follow Up Instructions:    I discussed the assessment and treatment plan with the patient. The patient was provided an opportunity to ask questions and all were answered. The patient agreed with the plan and demonstrated an understanding of the instructions.   The patient was advised to call back or seek an in-person evaluation if the symptoms worsen or if the condition fails to improve as anticipated.  I provided 15 minutes of non-face-to-face time during this encounter.   Ann Held, DO

## 2019-06-29 ENCOUNTER — Other Ambulatory Visit: Payer: Self-pay | Admitting: Family Medicine

## 2019-06-29 ENCOUNTER — Telehealth: Payer: Self-pay

## 2019-06-29 DIAGNOSIS — F419 Anxiety disorder, unspecified: Secondary | ICD-10-CM

## 2019-06-29 MED ORDER — ALPRAZOLAM 0.25 MG PO TABS: ORAL_TABLET | ORAL | 0 refills | Status: DC

## 2019-06-29 NOTE — Telephone Encounter (Signed)
Advised patient per Dr. Etter Sjogren

## 2019-06-29 NOTE — Telephone Encounter (Signed)
Xanax sent in-- she needs to make sure she has a driver and take the bottle with her incase she needs another one prior to test

## 2019-06-29 NOTE — Telephone Encounter (Signed)
Copied from [place] [id number]. Topic: General - Other >> Jun 29, 2019 11:14 AM Parke Poisson wrote: Reason for CRM: Pt states she has an MRI scheduled for 07/24/19 and will need medication for anxiety and pain since she will have to be in machine for 40 min or longer

## 2019-06-30 ENCOUNTER — Other Ambulatory Visit: Payer: Self-pay | Admitting: Family Medicine

## 2019-07-14 ENCOUNTER — Other Ambulatory Visit: Payer: Self-pay | Admitting: Family Medicine

## 2019-07-14 DIAGNOSIS — I10 Essential (primary) hypertension: Secondary | ICD-10-CM

## 2019-07-24 ENCOUNTER — Other Ambulatory Visit: Payer: Self-pay | Admitting: Family Medicine

## 2019-07-24 ENCOUNTER — Other Ambulatory Visit: Payer: Self-pay

## 2019-07-24 ENCOUNTER — Ambulatory Visit
Admission: RE | Admit: 2019-07-24 | Discharge: 2019-07-24 | Disposition: A | Discharge status: Home / Self Care | Payer: Medicare Other | Source: Ambulatory Visit | Attending: Family Medicine | Admitting: Family Medicine

## 2019-07-24 ENCOUNTER — Telehealth: Payer: Self-pay

## 2019-07-24 DIAGNOSIS — G8929 Other chronic pain: Secondary | ICD-10-CM

## 2019-07-24 DIAGNOSIS — M48061 Spinal stenosis, lumbar region without neurogenic claudication: Secondary | ICD-10-CM | POA: Diagnosis not present

## 2019-07-24 DIAGNOSIS — M545 Low back pain, unspecified: Secondary | ICD-10-CM

## 2019-07-24 NOTE — Telephone Encounter (Signed)
Patient calling with questions regarding her MRI. She saw your message on mychart that there were no changes however she was concerned that if there is no changes obviously something is wrong for her to be in this pain. She does not want surgery unless she absolutely had to. Her pain is worse when she walks but if she is seated it does not hurt. She is concerned that she can not do every day tasks such as making the bed and cleaning dishes. Rather than ortho referral she has a friend that was [facility] on [address] and her friend had great success. She would like to be seen there for her bulging disc and stenosis. Maybe they could offer her a shot? Please place referral. They are located at  [address] [place], [place] [address].

## 2019-08-02 ENCOUNTER — Other Ambulatory Visit: Payer: Self-pay | Admitting: Physician Assistant

## 2019-08-05 ENCOUNTER — Telehealth: Payer: Self-pay | Admitting: Physician Assistant

## 2019-08-05 ENCOUNTER — Inpatient Hospital Stay: Admission: RE | Admit: 2019-08-05 | Payer: Medicare Other | Source: Ambulatory Visit

## 2019-08-05 NOTE — Telephone Encounter (Signed)
Patient called and said that she has not seen any change at all with the lithium 150 mg at all but she does need a refill on it. She said it is your choice. She also wants you to know that she is worried about her memory and wants to discuss this at her next office visit. [facility] is [facility] on [address]

## 2019-08-06 ENCOUNTER — Other Ambulatory Visit: Payer: Self-pay

## 2019-08-06 MED ORDER — LITHIUM CARBONATE 150 MG PO CAPS: ORAL_CAPSULE | ORAL | 1 refills | Status: DC

## 2019-08-06 NOTE — Telephone Encounter (Signed)
Please see how she is.  I never got her Megan Duncan level.  What sx is she having that makes her feel that adding the Li 150 mg didn't help? I am fine w/ decreasing back to total of 600mg .

## 2019-08-07 NOTE — Telephone Encounter (Signed)
Error

## 2019-08-07 NOTE — Telephone Encounter (Signed)
ok 

## 2019-08-10 DIAGNOSIS — M549 Dorsalgia, unspecified: Secondary | ICD-10-CM | POA: Diagnosis not present

## 2019-08-10 DIAGNOSIS — M5136 Other intervertebral disc degeneration, lumbar region: Secondary | ICD-10-CM | POA: Diagnosis not present

## 2019-08-21 DIAGNOSIS — M47816 Spondylosis without myelopathy or radiculopathy, lumbar region: Secondary | ICD-10-CM | POA: Diagnosis not present

## 2019-08-21 DIAGNOSIS — M5136 Other intervertebral disc degeneration, lumbar region: Secondary | ICD-10-CM | POA: Diagnosis not present

## 2019-08-25 DIAGNOSIS — M47816 Spondylosis without myelopathy or radiculopathy, lumbar region: Secondary | ICD-10-CM | POA: Diagnosis not present

## 2019-09-03 ENCOUNTER — Telehealth: Payer: Self-pay

## 2019-09-03 DIAGNOSIS — Z122 Encounter for screening for malignant neoplasm of respiratory organs: Secondary | ICD-10-CM

## 2019-09-03 DIAGNOSIS — Z87891 Personal history of nicotine dependence: Secondary | ICD-10-CM

## 2019-09-03 NOTE — Telephone Encounter (Signed)
-----   Message from Osvaldo Shipper, [place] sent at 09/03/2019 10:51 AM EDT ----- Regarding: CT need New order and to be Resch Please put in new order and reschedule patient at another facility. We are unable to do CT because of insurance. Pt was scheduled 10/27.   Pt is aware appt was canceled.  Thank you   Erline Levine

## 2019-09-03 NOTE — Telephone Encounter (Signed)
Will route message to [facility] as I am not sure if the Lung Cancer screening programs need specific diagnoses.

## 2019-09-07 NOTE — Telephone Encounter (Signed)
New low dose CT order placed.  Megan Duncan will get pt rescheduled.

## 2019-09-08 ENCOUNTER — Inpatient Hospital Stay: Admission: RE | Admit: 2019-09-08 | Payer: Medicare Other | Source: Ambulatory Visit

## 2019-09-08 ENCOUNTER — Ambulatory Visit: Payer: Medicare Other

## 2019-09-11 ENCOUNTER — Encounter: Payer: Self-pay | Admitting: Physician Assistant

## 2019-09-11 ENCOUNTER — Ambulatory Visit (INDEPENDENT_AMBULATORY_CARE_PROVIDER_SITE_OTHER): Payer: Medicare Other | Admitting: Physician Assistant

## 2019-09-11 ENCOUNTER — Other Ambulatory Visit: Payer: Self-pay

## 2019-09-11 DIAGNOSIS — E032 Hypothyroidism due to medicaments and other exogenous substances: Secondary | ICD-10-CM | POA: Diagnosis not present

## 2019-09-11 DIAGNOSIS — F3181 Bipolar II disorder: Secondary | ICD-10-CM | POA: Diagnosis not present

## 2019-09-11 DIAGNOSIS — F419 Anxiety disorder, unspecified: Secondary | ICD-10-CM

## 2019-09-11 MED ORDER — LITHIUM CARBONATE 300 MG PO CAPS: ORAL_CAPSULE | ORAL | 0 refills | Status: DC

## 2019-09-11 NOTE — Progress Notes (Signed)
[facility]  Patient ID: Megan Duncan,  MRN: [id number]  PCP: Ann Held, DO  Date of Evaluation: 09/11/2019 Time spent:15 minutes  Chief Complaint:  Chief Complaint    Follow-up     Virtual Visit via Telephone Note  I connected with patient by a video enabled telemedicine application or telephone, with their informed consent, and verified patient privacy and that I am speaking with the correct person using two identifiers.  I am private, in my office and the patient is at home.   I discussed the limitations, risks, security and privacy concerns of performing an evaluation and management service by telephone and the availability of in person appointments. I also discussed with the patient that there may be a patient responsible charge related to this service. The patient expressed understanding and agreed to proceed.   I discussed the assessment and treatment plan with the patient. The patient was provided an opportunity to ask questions and all were answered. The patient agreed with the plan and demonstrated an understanding of the instructions.   The patient was advised to call back or seek an in-person evaluation if the symptoms worsen or if the condition fails to improve as anticipated.  I provided 15 minutes of non-face-to-face time during this encounter.  HISTORY/CURRENT STATUS: HPI For 3 month med check.  Has no desire to do anything.  Energy and motivation are low.  But not sad. No social life like going out to do things, but does talk to people on phone.  In bed a lot.  But, she still has so much back pain from sciatica she doesn't feel like doing anything b/c pain.  She had MRI, and is being sent back specialist. Has severe spinal stenosis. She isn't washing her hair and things like b/c 'it's too damn much trouble to get ready.'  Thinks a lot of it is b/c of Covid plus her back pain keeps her from not wanting to do things.  She ran out of the Li  150 mg so stopped that.  She hasn't noticed any difference being off that extra little bit. Only on 646m now.   Patient denies increased energy with decreased need for sleep, no increased talkativeness (I always talk a lot), no racing thoughts, no impulsivity or risky behaviors, no increased spending, no increased libido, no grandiosity.  Anxiety is controlled w/ Tranxene. Sleeps well. "I didn't get the Li level done like you've asked me to do. I'm not usually like that.  I've always done what people asked me to do."  "My whole goal when I started this journey was to get help w/ controlling anger.  And I have. I feel like a blank slate.  Before covid, I was playing Bridge and Bunco, but not now. I miss getting together w/ friends.  But talking on phone is good enough."  One of her good friends died from cancer about 2 weeks ago.  "That's been really sad."  No change in M/S ROS. Denies dizziness, syncope, seizures, numbness, tingling, tremor, tics, unsteady gait, slurred speech, confusion.   Individual Medical History/ Review of Systems: Changes? :Yes   Continues to have sciatica. Dx w/ Spinal stenosis, seeing spine specialist soon.  Past medications for mental health diagnoses include: Trileptal Lamictal Prozac Zoloft Effexor XR, Cymbalta, Paxil, lithium, Depakote, Latuda, Risperdal, Tegretol, Wellbutrin  Allergies: Atorvastatin, Codeine, Diclofenac, Doxycycline, and Sulfonamide derivatives  Current Medications:  Current Outpatient Medications:  .  albuterol (VENTOLIN HFA) 108 (90 Base)  MCG/ACT inhaler, Inhale 2 puffs into the lungs every 4 (four) hours as needed. For shortness of breath and wheezing, Disp: 18 g, Rfl: 3 .  amLODipine (NORVASC) 5 MG tablet, TAKE 1 TABLET BY MOUTH EVERY DAY, Disp: 90 tablet, Rfl: 1 .  blood glucose meter kit and supplies, Dispense based on patient and insurance preference. Use once a day.  DX Code: E11.9, Disp: 1 each, Rfl: 0 .  budesonide-formoterol  (SYMBICORT) 80-4.5 MCG/ACT inhaler, Inhale 2 puffs into the lungs 2 (two) times daily., Disp: 1 Inhaler, Rfl: 0 .  clorazepate (TRANXENE) 3.75 MG tablet, Take 1 tablet (3.75 mg total) by mouth 2 (two) times daily as needed for anxiety., Disp: 180 tablet, Rfl: 1 .  DULoxetine (CYMBALTA) 20 MG capsule, Take 1 capsule (20 mg total) by mouth daily. With 60 mg= 80 mg., Disp: 30 capsule, Rfl: 0 .  DULoxetine (CYMBALTA) 60 MG capsule, TAKE 1 CAPSULE BY MOUTH EVERY MORNING, Disp: 90 capsule, Rfl: 0 .  glucose blood test strip, Use as instructed once a day.  Dx Code: E11.9, Disp: 100 each, Rfl: 12 .  Lancets MISC, Use as directed once a day.  Dx code: E11.9, Disp: 100 each, Rfl: 1 .  levothyroxine (SYNTHROID) 25 MCG tablet, TAKE 1 TABLET (25 MCG TOTAL) BY MOUTH DAILY BEFORE BREAKFAST., Disp: 90 tablet, Rfl: 1 .  lithium carbonate 300 MG capsule, 2 capsules hs, Disp: 180 capsule, Rfl: 0 .  propranolol (INDERAL) 10 MG tablet, Take 1 tablet (10 mg total) by mouth 2 (two) times daily., Disp: 180 tablet, Rfl: 0 .  traMADol (ULTRAM) 50 MG tablet, 1-2 po q6h prn, Disp: 60 tablet, Rfl: 1 .  ALPRAZolam (XANAX) 0.25 MG tablet, 1 po 30 min prior to procedure (Patient not taking: Reported on 09/11/2019), Disp: 20 tablet, Rfl: 0 .  cyclobenzaprine (FLEXERIL) 10 MG tablet, Take 1 tablet (10 mg total) by mouth 3 (three) times daily as needed. For muscle spasms (Patient not taking: Reported on 04/14/2019), Disp: 30 tablet, Rfl: 0 .  hydrocortisone-pramoxine (ANALPRAM HC) 2.5-1 % rectal cream, Place 1 application rectally 3 (three) times daily. (Patient taking differently: Place 1 application rectally as needed. ), Disp: 30 g, Rfl: 3 .  nystatin cream (MYCOSTATIN), Apply 1 application topically 2 (two) times daily. (Patient not taking: Reported on 09/11/2019), Disp: 30 g, Rfl: 0 .  predniSONE (DELTASONE) 10 MG tablet, Take 4 tabs for 2 days, then 3 tabs for 2 days, 2 tabs for 2 days, then 1 tab for 2 days, then stop. (Patient not  taking: Reported on 09/11/2019), Disp: 20 tablet, Rfl: 0 .  ranitidine (ZANTAC) 150 MG tablet, Take 150 mg by mouth as needed. , Disp: , Rfl:  .  tiZANidine (ZANAFLEX) 4 MG capsule, , Disp: , Rfl:  .  topiramate (TOPAMAX) 25 MG tablet, Take 1 tablet (25 mg total) by mouth 2 (two) times daily. (Patient not taking: Reported on 09/11/2019), Disp: 60 tablet, Rfl: 1 Medication Side Effects: none  Family Medical/ Social History: Changes? No  MENTAL HEALTH EXAM:  There were no vitals taken for this visit.There is no height or weight on file to calculate BMI.  General Appearance: unable to assess  Eye Contact:  unable to assess  Speech:  Clear and Coherent and Talkative  Volume:  Normal  Mood:  Euthymic  Affect:  unable to assess  Thought Process:  Goal Directed  Orientation:  Full (Time, Place, and Person)  Thought Content: Logical   Suicidal Thoughts:  No  Homicidal Thoughts:  No  Memory:  WNL  Judgement:  Good  Insight:  Good  Psychomotor Activity:  unable to assess  Concentration:  Concentration: Good  Recall:  Good  Fund of Knowledge: Good  Language: Good  Assets:  Desire for Improvement  ADL's:  Intact  Cognition: WNL  Prognosis:  Good    DIAGNOSES:    ICD-10-CM   1. Bipolar 2 disorder (HCC)  F31.81   2. Anxiety  F41.9   3. 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  I think it would be a good idea to increase the Cymbalta.  It may help the pain as well as depression.  I really believe that it is mostly her back that is bringing her down.  She is not used to having any physical symptoms that prevent her from doing the things she wants to do. Increase Cymbalta 50m + 222m(2)= 10037mContinue Li 600 mg/d.  Since she has already decreased it and not  having any problems. Continue Tranxene 3.75 mg 1 twice daily as needed. Return in 4 to 6 weeks.  TerDonnal MoatA-C

## 2019-09-14 DIAGNOSIS — M47816 Spondylosis without myelopathy or radiculopathy, lumbar region: Secondary | ICD-10-CM | POA: Diagnosis not present

## 2019-09-16 ENCOUNTER — Other Ambulatory Visit: Payer: Self-pay

## 2019-09-16 ENCOUNTER — Ambulatory Visit
Admission: RE | Admit: 2019-09-16 | Discharge: 2019-09-16 | Disposition: A | Discharge status: Home / Self Care | Payer: Medicare Other | Source: Ambulatory Visit | Attending: Acute Care | Admitting: Acute Care

## 2019-09-16 DIAGNOSIS — Z122 Encounter for screening for malignant neoplasm of respiratory organs: Secondary | ICD-10-CM

## 2019-09-16 DIAGNOSIS — Z87891 Personal history of nicotine dependence: Secondary | ICD-10-CM

## 2019-09-22 ENCOUNTER — Ambulatory Visit (INDEPENDENT_AMBULATORY_CARE_PROVIDER_SITE_OTHER): Payer: Medicare Other | Admitting: Family Medicine

## 2019-09-22 ENCOUNTER — Other Ambulatory Visit: Payer: Self-pay

## 2019-09-22 ENCOUNTER — Encounter: Payer: Self-pay | Admitting: Family Medicine

## 2019-09-22 VITALS — BP 120/80 | Ht 62.5 in

## 2019-09-22 DIAGNOSIS — I10 Essential (primary) hypertension: Secondary | ICD-10-CM | POA: Diagnosis not present

## 2019-09-22 DIAGNOSIS — E782 Mixed hyperlipidemia: Secondary | ICD-10-CM

## 2019-09-22 DIAGNOSIS — Z79899 Other long term (current) drug therapy: Secondary | ICD-10-CM | POA: Diagnosis not present

## 2019-09-22 DIAGNOSIS — IMO0002 Reserved for concepts with insufficient information to code with codable children: Secondary | ICD-10-CM

## 2019-09-22 DIAGNOSIS — M436 Torticollis: Secondary | ICD-10-CM

## 2019-09-22 DIAGNOSIS — E039 Hypothyroidism, unspecified: Secondary | ICD-10-CM

## 2019-09-22 DIAGNOSIS — Z23 Encounter for immunization: Secondary | ICD-10-CM | POA: Diagnosis not present

## 2019-09-22 DIAGNOSIS — E1151 Type 2 diabetes mellitus with diabetic peripheral angiopathy without gangrene: Secondary | ICD-10-CM | POA: Diagnosis not present

## 2019-09-22 DIAGNOSIS — E1165 Type 2 diabetes mellitus with hyperglycemia: Secondary | ICD-10-CM | POA: Diagnosis not present

## 2019-09-22 DIAGNOSIS — F3181 Bipolar II disorder: Secondary | ICD-10-CM | POA: Diagnosis not present

## 2019-09-22 DIAGNOSIS — M5416 Radiculopathy, lumbar region: Secondary | ICD-10-CM

## 2019-09-22 MED ORDER — CYCLOBENZAPRINE HCL 10 MG PO TABS: 10.0000 mg | ORAL_TABLET | Freq: Three times a day (TID) | ORAL | 0 refills | Status: DC | PRN

## 2019-09-22 NOTE — Assessment & Plan Note (Signed)
Encouraged heart healthy diet, increase exercise, avoid trans fats, consider a krill oil cap daily 

## 2019-09-22 NOTE — Progress Notes (Signed)
Virtual Visit via Video Note  I connected with Megan Duncan on 09/22/19 at 10:20 AM EST by a video enabled telemedicine application and verified that I am speaking with the correct person using two identifiers.  Location: Patient: home  Provider: office    I discussed the limitations of evaluation and management by telemedicine and the availability of in person appointments. The patient expressed understanding and agreed to proceed.  History of Present Illness: Pt is home for f/u dm, bp and thyroid She is c/o back pain --- MRi was done,  She saw murphy wainer and was given a pred pack   She then went to spine and scoliosis---  She has had 12 shots   no relief She now has burning pain L side shoulder blade -- comes and goes   HYPERTENSION   Blood pressure range-good per pt  Chest pain- no      Dyspnea- no Lightheadedness- no   Edema- no  Other side effects - no   Medication compliance: good Low salt diet- yes    DIABETES    Blood Sugar ranges-not checking   Polyuria- no New Visual problems- no  Hypoglycemic symptoms- no  Other side effects-no Medication compliance - good Last eye exam- due    HYPERLIPIDEMIA  Medication compliance- good RUQ pain- no  Muscle aches- no Other side effects-no     Observations/Objective: Vitals:   09/22/19 1017  BP: 120/80    Pt is in NAD  Assessment and Plan:  1. Torticollis Muscle relaxer  Improved  - cyclobenzaprine (FLEXERIL) 10 MG tablet; Take 1 tablet (10 mg total) by mouth 3 (three) times daily as needed. For muscle spasms  Dispense: 30 tablet; Refill: 0  2. Essential hypertension Well controlled, no changes to meds. Encouraged heart healthy diet such as the DASH diet and exercise as tolerated.  - Lipid panel; Future - Comprehensive metabolic panel; Future  3. Hypothyroidism, unspecified type Check labs  - TSH; Future  4. Uncontrolled type 2 diabetes mellitus with hyperglycemia (HCC) Check labs  Pt has not  been checking glucose  - Lipid panel; Future - Hemoglobin A1c; Future - Comprehensive metabolic panel; Future - Microalbumin / creatinine urine ratio  5. High risk medication use   - Lithium level; Future  6. Need for influenza vaccination   - Flu Vaccine QUAD High Dose(Fluad); Future  7. Lumbar back pain with radiculopathy affecting left lower extremity F/u ortho   8. Bipolar 2 disorder ([place]) Per psych  9. Mixed hyperlipidemia Encouraged heart healthy diet, increase exercise, avoid trans fats, consider a krill oil cap daily  10. DM (diabetes mellitus) type II uncontrolled, periph vascular disorder ([place]) Check labs  con't meds   . Follow Up Instructions:    I discussed the assessment and treatment plan with the patient. The patient was provided an opportunity to ask questions and all were answered. The patient agreed with the plan and demonstrated an understanding of the instructions.   The patient was advised to call back or seek an in-person evaluation if the symptoms worsen or if the condition fails to improve as anticipated.  I provided 25 minutes of non-face-to-face time during this encounter.   Ann Held, DO

## 2019-09-22 NOTE — Assessment & Plan Note (Signed)
Per psych 

## 2019-09-22 NOTE — Assessment & Plan Note (Signed)
hgba1c to be checked, minimize simple carbs. Increase exercise as tolerated. Continue current meds  

## 2019-09-22 NOTE — Assessment & Plan Note (Signed)
Well controlled, no changes to meds. Encouraged heart healthy diet such as the DASH diet and exercise as tolerated.  °

## 2019-09-22 NOTE — Assessment & Plan Note (Signed)
Fu ortho

## 2019-09-22 NOTE — Patient Instructions (Signed)
Carbohydrate Counting for Diabetes Mellitus, Adult  Carbohydrate counting is a method of keeping track of how many carbohydrates you eat. Eating carbohydrates naturally increases the amount of sugar (glucose) in the blood. Counting how many carbohydrates you eat helps keep your blood glucose within normal limits, which helps you manage your diabetes (diabetes mellitus). It is important to know how many carbohydrates you can safely have in each meal. This is different for every person. A diet and nutrition specialist (registered dietitian) can help you make a meal plan and calculate how many carbohydrates you should have at each meal and snack. Carbohydrates are found in the following foods:  Grains, such as breads and cereals.  Dried beans and soy products.  Starchy vegetables, such as potatoes, peas, and corn.  Fruit and fruit juices.  Milk and yogurt.  Sweets and snack foods, such as cake, cookies, candy, chips, and soft drinks. How do I count carbohydrates? There are two ways to count carbohydrates in food. You can use either of the methods or a combination of both. Reading "Nutrition Facts" on packaged food The "Nutrition Facts" list is included on the labels of almost all packaged foods and beverages in the [place] It includes:  The serving size.  Information about nutrients in each serving, including the grams (g) of carbohydrate per serving. To use the "Nutrition Facts":  Decide how many servings you will have.  Multiply the number of servings by the number of carbohydrates per serving.  The resulting number is the total amount of carbohydrates that you will be having. Learning standard serving sizes of other foods When you eat carbohydrate foods that are not packaged or do not include "Nutrition Facts" on the label, you need to measure the servings in order to count the amount of carbohydrates:  Measure the foods that you will eat with a food scale or measuring cup, if needed.   Decide how many standard-size servings you will eat.  Multiply the number of servings by 15. Most carbohydrate-rich foods have about 15 g of carbohydrates per serving. ? For example, if you eat 8 oz (170 g) of strawberries, you will have eaten 2 servings and 30 g of carbohydrates (2 servings x 15 g = 30 g).  For foods that have more than one food mixed, such as soups and casseroles, you must count the carbohydrates in each food that is included. The following list contains standard serving sizes of common carbohydrate-rich foods. Each of these servings has about 15 g of carbohydrates:   hamburger bun or  English muffin.   oz (15 mL) syrup.   oz (14 g) jelly.  1 slice of bread.  1 six-inch tortilla.  3 oz (85 g) cooked rice or pasta.  4 oz (113 g) cooked dried beans.  4 oz (113 g) starchy vegetable, such as peas, corn, or potatoes.  4 oz (113 g) hot cereal.  4 oz (113 g) mashed potatoes or  of a large baked potato.  4 oz (113 g) canned or frozen fruit.  4 oz (120 mL) fruit juice.  4-6 crackers.  6 chicken nuggets.  6 oz (170 g) unsweetened dry cereal.  6 oz (170 g) plain fat-free yogurt or yogurt sweetened with artificial sweeteners.  8 oz (240 mL) milk.  8 oz (170 g) fresh fruit or one small piece of fruit.  24 oz (680 g) popped popcorn. Example of carbohydrate counting Sample meal  3 oz (85 g) chicken breast.  6 oz (170 g)   brown rice.  4 oz (113 g) corn.  8 oz (240 mL) milk.  8 oz (170 g) strawberries with sugar-free whipped topping. Carbohydrate calculation 1. Identify the foods that contain carbohydrates: ? Rice. ? Corn. ? Milk. ? Strawberries. 2. Calculate how many servings you have of each food: ? 2 servings rice. ? 1 serving corn. ? 1 serving milk. ? 1 serving strawberries. 3. Multiply each number of servings by 15 g: ? 2 servings rice x 15 g = 30 g. ? 1 serving corn x 15 g = 15 g. ? 1 serving milk x 15 g = 15 g. ? 1 serving  strawberries x 15 g = 15 g. 4. Add together all of the amounts to find the total grams of carbohydrates eaten: ? 30 g + 15 g + 15 g + 15 g = 75 g of carbohydrates total. Summary  Carbohydrate counting is a method of keeping track of how many carbohydrates you eat.  Eating carbohydrates naturally increases the amount of sugar (glucose) in the blood.  Counting how many carbohydrates you eat helps keep your blood glucose within normal limits, which helps you manage your diabetes.  A diet and nutrition specialist (registered dietitian) can help you make a meal plan and calculate how many carbohydrates you should have at each meal and snack. This information is not intended to replace advice given to you by your health care provider. Make sure you discuss any questions you have with your health care provider. Document Released: 10/29/2005 Document Revised: 05/23/2017 Document Reviewed: 04/11/2016 Elsevier Patient Education  2020 Elsevier Inc.  

## 2019-09-24 ENCOUNTER — Telehealth: Payer: Self-pay | Admitting: Acute Care

## 2019-09-24 DIAGNOSIS — Z87891 Personal history of nicotine dependence: Secondary | ICD-10-CM

## 2019-09-24 DIAGNOSIS — Z122 Encounter for screening for malignant neoplasm of respiratory organs: Secondary | ICD-10-CM

## 2019-09-24 NOTE — Telephone Encounter (Signed)
Pt informed of CT results per Sarah Groce, NP.  PT verbalized understanding.  Copy sent to PCP.  Order placed for 1 yr f/u CT.  

## 2019-09-30 ENCOUNTER — Other Ambulatory Visit (INDEPENDENT_AMBULATORY_CARE_PROVIDER_SITE_OTHER): Payer: Medicare Other

## 2019-09-30 ENCOUNTER — Other Ambulatory Visit: Payer: Self-pay

## 2019-09-30 DIAGNOSIS — E1165 Type 2 diabetes mellitus with hyperglycemia: Secondary | ICD-10-CM | POA: Diagnosis not present

## 2019-09-30 DIAGNOSIS — E039 Hypothyroidism, unspecified: Secondary | ICD-10-CM

## 2019-09-30 DIAGNOSIS — I10 Essential (primary) hypertension: Secondary | ICD-10-CM

## 2019-09-30 DIAGNOSIS — Z79899 Other long term (current) drug therapy: Secondary | ICD-10-CM

## 2019-09-30 LAB — LIPID PANEL
Cholesterol: 193 mg/dL (ref 0–200)
HDL: 57 mg/dL (ref >39.00)
LDL Cholesterol: 111 mg/dL — ABNORMAL HIGH (ref 0–99)
NonHDL: 136.31
Total CHOL/HDL Ratio: 3
Triglycerides: 128 mg/dL (ref 0.0–149.0)
VLDL: 25.6 mg/dL (ref 0.0–40.0)

## 2019-09-30 LAB — COMPREHENSIVE METABOLIC PANEL
ALT: 15 U/L (ref 0–35)
AST: 16 U/L (ref 0–37)
Albumin: 4.2 g/dL (ref 3.5–5.2)
Alkaline Phosphatase: 80 U/L (ref 39–117)
BUN: 7 mg/dL (ref 6–23)
CO2: 26 mEq/L (ref 19–32)
Calcium: 9.4 mg/dL (ref 8.4–10.5)
Chloride: 103 mEq/L (ref 96–112)
Creatinine, Ser: 0.84 mg/dL (ref 0.40–1.20)
GFR: 66.39 mL/min (ref >60.00)
Glucose, Bld: 279 mg/dL — ABNORMAL HIGH (ref 70–99)
Potassium: 4.6 mEq/L (ref 3.5–5.1)
Sodium: 139 mEq/L (ref 135–145)
Total Bilirubin: 0.5 mg/dL (ref 0.2–1.2)
Total Protein: 6.7 g/dL (ref 6.0–8.3)

## 2019-09-30 LAB — HEMOGLOBIN A1C: Hgb A1c MFr Bld: 8.5 % — ABNORMAL HIGH (ref 4.6–6.5)

## 2019-09-30 LAB — TSH: TSH: 4.26 u[IU]/mL (ref 0.35–4.50)

## 2019-10-01 LAB — LITHIUM LEVEL: Lithium Lvl: 0.6 mmol/L (ref 0.6–1.2)

## 2019-10-06 ENCOUNTER — Other Ambulatory Visit: Payer: Self-pay | Admitting: Family Medicine

## 2019-10-06 DIAGNOSIS — E1169 Type 2 diabetes mellitus with other specified complication: Secondary | ICD-10-CM

## 2019-10-06 DIAGNOSIS — E785 Hyperlipidemia, unspecified: Secondary | ICD-10-CM

## 2019-10-06 DIAGNOSIS — E1165 Type 2 diabetes mellitus with hyperglycemia: Secondary | ICD-10-CM

## 2019-10-07 ENCOUNTER — Other Ambulatory Visit: Payer: Self-pay | Admitting: Family Medicine

## 2019-10-07 ENCOUNTER — Other Ambulatory Visit: Payer: Self-pay | Admitting: Physician Assistant

## 2019-10-07 DIAGNOSIS — E785 Hyperlipidemia, unspecified: Secondary | ICD-10-CM

## 2019-10-07 DIAGNOSIS — E1165 Type 2 diabetes mellitus with hyperglycemia: Secondary | ICD-10-CM

## 2019-10-07 DIAGNOSIS — E1169 Type 2 diabetes mellitus with other specified complication: Secondary | ICD-10-CM

## 2019-10-07 MED ORDER — METFORMIN HCL 500 MG PO TABS: 500.0000 mg | ORAL_TABLET | Freq: Two times a day (BID) | ORAL | 3 refills | Status: DC

## 2019-10-12 ENCOUNTER — Encounter: Payer: Self-pay | Admitting: Physician Assistant

## 2019-10-12 ENCOUNTER — Ambulatory Visit (INDEPENDENT_AMBULATORY_CARE_PROVIDER_SITE_OTHER): Payer: Medicare Other | Admitting: Physician Assistant

## 2019-10-12 DIAGNOSIS — F32A Depression, unspecified: Secondary | ICD-10-CM

## 2019-10-12 DIAGNOSIS — F419 Anxiety disorder, unspecified: Secondary | ICD-10-CM | POA: Diagnosis not present

## 2019-10-12 DIAGNOSIS — F329 Major depressive disorder, single episode, unspecified: Secondary | ICD-10-CM

## 2019-10-12 DIAGNOSIS — E032 Hypothyroidism due to medicaments and other exogenous substances: Secondary | ICD-10-CM | POA: Diagnosis not present

## 2019-10-12 DIAGNOSIS — F3181 Bipolar II disorder: Secondary | ICD-10-CM

## 2019-10-12 NOTE — Progress Notes (Signed)
[facility]  Patient ID: Megan Duncan,  MRN: [id number]  PCP: Ann Held, DO  Date of Evaluation: 10/12/2019 Time spent:15 minutes  Chief Complaint:  Chief Complaint    Anxiety; Depression; Follow-up     Virtual Visit via Telephone Note  I connected with patient by a video enabled telemedicine application or telephone, with their informed consent, and verified patient privacy and that I am speaking with the correct person using two identifiers.  I am private, in my office and the patient is home.   I discussed the limitations, risks, security and privacy concerns of performing an evaluation and management service by telephone and the availability of in person appointments. I also discussed with the patient that there may be a patient responsible charge related to this service. The patient expressed understanding and agreed to proceed.   I discussed the assessment and treatment plan with the patient. The patient was provided an opportunity to ask questions and all were answered. The patient agreed with the plan and demonstrated an understanding of the instructions.   The patient was advised to call back or seek an in-person evaluation if the symptoms worsen or if the condition fails to improve as anticipated.  I provided 15 minutes of non-face-to-face time during this encounter.  HISTORY/CURRENT STATUS: HPI For routine med check.  Is having an RFA tomorrow on L-spine. Nervous about it but hopeful it will help.  "I'm healthy as far as my mind goes.  I think it has helped that we increased the Cymbalta."  Is able to enjoy things.  Energy and motivation are as good as they can be right now with her physical issues.  She is sleeping well.  Denies suicidal or homicidal thoughts.  Anxiety is generally well controlled.  She does need the Tranxene may be a couple of times a week or so.  It is effective.  Patient denies increased energy with decreased need for  sleep, no increased talkativeness, no racing thoughts, no impulsivity or risky behaviors, no increased spending, no increased libido, no grandiosity.  Denies dizziness, syncope, seizures, numbness, tingling, tremor, tics, unsteady gait, slurred speech, confusion. Denies muscle or joint pain, stiffness, or dystonia.  Individual Medical History/ Review of Systems: Changes? :Yes  see above. Also dx w/ DM  Past medications for mental health diagnoses include: Trileptal Lamictal Prozac Zoloft Effexor XR, Cymbalta, Paxil, lithium, Depakote, Latuda, Risperdal, Tegretol, Wellbutrin  Allergies: Atorvastatin, Codeine, Diclofenac, Doxycycline, and Sulfonamide derivatives  Current Medications:  Current Outpatient Medications:  .  albuterol (VENTOLIN HFA) 108 (90 Base) MCG/ACT inhaler, Inhale 2 puffs into the lungs every 4 (four) hours as needed. For shortness of breath and wheezing, Disp: 18 g, Rfl: 3 .  amLODipine (NORVASC) 5 MG tablet, TAKE 1 TABLET BY MOUTH EVERY DAY, Disp: 90 tablet, Rfl: 1 .  blood glucose meter kit and supplies, Dispense based on patient and insurance preference. Use once a day.  DX Code: E11.9, Disp: 1 each, Rfl: 0 .  budesonide-formoterol (SYMBICORT) 80-4.5 MCG/ACT inhaler, Inhale 2 puffs into the lungs 2 (two) times daily., Disp: 1 Inhaler, Rfl: 0 .  clorazepate (TRANXENE) 3.75 MG tablet, Take 1 tablet (3.75 mg total) by mouth 2 (two) times daily as needed for anxiety., Disp: 180 tablet, Rfl: 1 .  cyclobenzaprine (FLEXERIL) 10 MG tablet, Take 1 tablet (10 mg total) by mouth 3 (three) times daily as needed. For muscle spasms, Disp: 30 tablet, Rfl: 0 .  DULoxetine (CYMBALTA) 20 MG capsule, Take  1 capsule (20 mg total) by mouth daily. With 60 mg= 80 mg. (Patient taking differently: Take 40 mg by mouth daily. ), Disp: 30 capsule, Rfl: 0 .  DULoxetine (CYMBALTA) 60 MG capsule, TAKE 1 CAPSULE BY MOUTH EVERY MORNING, Disp: 90 capsule, Rfl: 0 .  glucose blood test strip, Use as instructed  once a day.  Dx Code: E11.9, Disp: 100 each, Rfl: 12 .  hydrocortisone-pramoxine (ANALPRAM HC) 2.5-1 % rectal cream, Place 1 application rectally 3 (three) times daily. (Patient taking differently: Place 1 application rectally as needed. ), Disp: 30 g, Rfl: 3 .  Lancets MISC, Use as directed once a day.  Dx code: E11.9, Disp: 100 each, Rfl: 1 .  levothyroxine (SYNTHROID) 25 MCG tablet, TAKE 1 TABLET (25 MCG TOTAL) BY MOUTH DAILY BEFORE BREAKFAST., Disp: 90 tablet, Rfl: 1 .  lithium carbonate 300 MG capsule, 2 capsules hs, Disp: 180 capsule, Rfl: 0 .  metFORMIN (GLUCOPHAGE) 500 MG tablet, Take 1 tablet (500 mg total) by mouth 2 (two) times daily with a meal., Disp: 60 tablet, Rfl: 3 .  propranolol (INDERAL) 10 MG tablet, Take 1 tablet (10 mg total) by mouth 2 (two) times daily., Disp: 180 tablet, Rfl: 0 .  ALPRAZolam (XANAX) 0.25 MG tablet, 1 po 30 min prior to procedure (Patient not taking: Reported on 09/11/2019), Disp: 20 tablet, Rfl: 0 .  nystatin cream (MYCOSTATIN), Apply 1 application topically 2 (two) times daily. (Patient not taking: Reported on 09/11/2019), Disp: 30 g, Rfl: 0 .  ranitidine (ZANTAC) 150 MG tablet, Take 150 mg by mouth as needed. , Disp: , Rfl:  .  tiZANidine (ZANAFLEX) 4 MG capsule, , Disp: , Rfl:  .  topiramate (TOPAMAX) 25 MG tablet, Take 1 tablet (25 mg total) by mouth 2 (two) times daily. (Patient not taking: Reported on 10/12/2019), Disp: 60 tablet, Rfl: 1 .  traMADol (ULTRAM) 50 MG tablet, 1-2 po q6h prn (Patient not taking: Reported on 10/12/2019), Disp: 60 tablet, Rfl: 1 Medication Side Effects: none  Family Medical/ Social History: Changes? No  MENTAL HEALTH EXAM:  There were no vitals taken for this visit.There is no height or weight on file to calculate BMI.  General Appearance: unable to assess  Eye Contact:  unable to assess  Speech:  Clear and Coherent and Talkative  Volume:  Normal  Mood:  Euthymic  Affect:  unable to assess  Thought Process:  Goal  Directed and Descriptions of Associations: Intact  Orientation:  Full (Time, Place, and Person)  Thought Content: Logical   Suicidal Thoughts:  No  Homicidal Thoughts:  No  Memory:  WNL  Judgement:  Good  Insight:  Good  Psychomotor Activity:  unable to assess  Concentration:  Concentration: Good  Recall:  Good  Fund of Knowledge: Good  Language: Good  Assets:  Desire for Improvement  ADL's:  Intact  Cognition: WNL  Prognosis:  Good  09/30/2019, Lithium 0.6,  TSH 4.26, BUN/CR nl, Glu 279, A1C 8.5  DIAGNOSES:    ICD-10-CM   1. Depression, unspecified depression type  F32.9   2. Bipolar 2 disorder ([place])  F31.81   3. 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 Anxiety  F41.9     Receiving Psychotherapy: No    RECOMMENDATIONS:  Continue Tranxene 3.75 mg twice daily as needed. Continue Cymbalta 20 mg, 2 p.o. every morning with 60 mg to equal a total of 100 mg. Continue Cymbalta 60 mg daily.  See above. Continue lithium 300 mg,  2 nightly. Continue Synthroid 25 mcg. Return in 4 to 6 weeks.  Donnal Moat, PA-C

## 2019-10-13 ENCOUNTER — Telehealth: Payer: Self-pay | Admitting: *Deleted

## 2019-10-13 DIAGNOSIS — IMO0002 Reserved for concepts with insufficient information to code with codable children: Secondary | ICD-10-CM

## 2019-10-13 DIAGNOSIS — E1151 Type 2 diabetes mellitus with diabetic peripheral angiopathy without gangrene: Secondary | ICD-10-CM

## 2019-10-13 DIAGNOSIS — M47816 Spondylosis without myelopathy or radiculopathy, lumbar region: Secondary | ICD-10-CM | POA: Diagnosis not present

## 2019-10-13 NOTE — Telephone Encounter (Signed)
Copied from [place] [id number]. Topic: General - Other >> Oct 13, 2019 12:16 PM Leward Quan A wrote: Reason for CRM: Patient called to say that she is in need of a referral to the [place] Diabetic class. Asking if she can get this as soon as possible. Patient was informed that classes are filling up very fast and need to get this ASAP. Please call patient at Ph#  [phone]

## 2019-10-13 NOTE — Addendum Note (Signed)
Addended by: Sanda Linger on: 10/13/2019 02:53 PM   Modules accepted: Orders

## 2019-10-13 NOTE — Telephone Encounter (Signed)
Referral placed.

## 2019-10-27 ENCOUNTER — Other Ambulatory Visit: Payer: Self-pay | Admitting: Physician Assistant

## 2019-10-27 DIAGNOSIS — M47816 Spondylosis without myelopathy or radiculopathy, lumbar region: Secondary | ICD-10-CM | POA: Diagnosis not present

## 2019-11-05 ENCOUNTER — Ambulatory Visit: Payer: Self-pay | Admitting: *Deleted

## 2019-11-05 NOTE — Telephone Encounter (Signed)
Message from Esaw Dace sent at 11/05/2019 12:25 PM EST  Summary: Diabetes questions   Pt stated she is newly diagnosed diabetic and would like to speak with a nurse about what happens when her blood sugar goes too high or too low. Stated it is okay at the current moment. Please adivse.          Returned call to patient who states that she is confused about her diagnosis of diabetes type II. Pt states that she was diagnosed with diabetes II and was given a prescription for Metformin but was not educated about her diagnosis or what she needs to do. Pt was referred for a diabetic class and is scheduled to have it in January.Pt provided with home care advice for low and high glucose readings and managing diabetes at home. Pt verbalized understanding.   Reason for Disposition . Blood glucose 70-240 mg/dL (3.9 -13.3 mmol/L) . [1] Blood glucose < 70 mg/dL (3.9 mmol/L) or symptomatic AND [2] cause known  Protocols used: DIABETES - HIGH BLOOD SUGAR-A-AH, DIABETES - LOW BLOOD SUGAR-A-AH

## 2019-11-09 ENCOUNTER — Ambulatory Visit: Payer: Self-pay | Admitting: Family Medicine

## 2019-11-09 NOTE — Telephone Encounter (Addendum)
Pt reports blood sugar trending low; has checked multiple times this afternoon; 87 prior to lunch, 73 after lunch to low of 68. 1 hour after that at 1356.. 87, at  1412..  73, during call. 90.  Pt stated she did feel "A little shaky", not presently.States at 6, "Ate a few grapes."  Over weekend BS in low 100s, 104-118. Pt checking 7x daily. Taking metformin as ordered. Also reports has lost 11 lbs since November. Pt requesting education regarding diet and diagnosis. States has classes starting 11/16/2019 but requesting CB from practice "Soon please." Assured NT would rote to practice for dr. Megan Mans review.    Care advise given per protocol, verbalizes understanding.  CB# [phone]  Reason for Disposition . [1] Blood glucose < 70  mg/dL (3.9 mmol/L) or symptomatic, now improved with Care Advice AND [2] cause unknown  Answer Assessment - Initial Assessment Questions 1. SYMPTOMS: "What symptoms are you concerned about?"     *No Answer* 2. ONSET:  "When did the symptoms start?"     *No Answer* 3. BLOOD GLUCOSE: "What is your blood glucose level?"      *No Answer* 4. USUAL RANGE: "What is your blood glucose level usually?" (e.g., usual fasting morning value, usual evening value)     *No Answer* 5. TYPE 1 or 2:  "Do you know what type of diabetes you have?"  (e.g., Type 1, Type 2, Gestational; doesn't know)      *No Answer* 6. INSULIN: "Do you take insulin?" "What type of insulin(s) do you use? What is the mode of delivery? (syringe, pen; injection or pump) "When did you last give yourself an insulin dose?" (i.e., time or hours/minutes ago) "How much did you give?" (i.e., how many units)     *No Answer* 7. DIABETES PILLS: "Do you take any pills for your diabetes?"     *No Answer* 8. OTHER SYMPTOMS: "Do you have any symptoms?" (e.g., fever, frequent urination, difficulty breathing, vomiting)     *No Answer* 9. LOW BLOOD GLUCOSE TREATMENT: "What have you done so far to treat the low blood  glucose level?"     *No Answer* 10. FOOD: "When did you last eat or drink?"       *No Answer* 11. ALONE: "Are you alone right now or is someone with you?"        *No Answer* 12. PREGNANCY: "Is there any chance you are pregnant?" "When was your last menstrual period?"       *No Answer*  Protocols used: DIABETES - LOW BLOOD SUGAR-A-AH

## 2019-11-09 NOTE — Telephone Encounter (Signed)
Have pt hold her metformin tonight And call us in am with blood sugar

## 2019-11-10 ENCOUNTER — Telehealth: Payer: Self-pay

## 2019-11-10 NOTE — Telephone Encounter (Signed)
Those were actually good readings-- not too low

## 2019-11-10 NOTE — Telephone Encounter (Signed)
Copied from [place] [id number]. Topic: General - Other >> Nov 10, 2019 10:41 AM Yvette Rack wrote: Reason for CRM: Pt returned call to [facility].Attempted to transfer pt to the office but there was no answer. Pt requests call back. Cb# [phone]

## 2019-11-10 NOTE — Telephone Encounter (Signed)
Left message to return call left detailed message.

## 2019-11-10 NOTE — Telephone Encounter (Signed)
Attempted to call patient. VM left. Diabetic information mailed

## 2019-11-10 NOTE — Telephone Encounter (Signed)
Patient states she did not get my message until this afternoon- She will skip metformin tonight and call me back in the morning is BS reading.   This morning her readings were 115 before breakfast, 132 after breakfast and just now 2:50 it is 120 after just eating lunch.

## 2019-11-11 NOTE — Telephone Encounter (Signed)
Patient notified and verbalized understand. She will take bid and call back if any low readings.

## 2019-11-11 NOTE — Telephone Encounter (Signed)
Pt called and stated that he blood sugar was 126 this morning before breakfast. Pt would like to know when she should be taking metformin.  Please advise

## 2019-11-11 NOTE — Telephone Encounter (Signed)
She can take it bid as prescribed---  126 is good and yesterday sugars were not bad as well

## 2019-11-16 ENCOUNTER — Other Ambulatory Visit: Payer: Self-pay

## 2019-11-16 ENCOUNTER — Encounter: Payer: Medicare Other | Attending: Family Medicine | Admitting: Dietician

## 2019-11-16 ENCOUNTER — Encounter: Payer: Self-pay | Admitting: Dietician

## 2019-11-16 DIAGNOSIS — E119 Type 2 diabetes mellitus without complications: Secondary | ICD-10-CM | POA: Diagnosis not present

## 2019-11-16 NOTE — Patient Instructions (Addendum)
Consider 1000-2000 units of vitamin D daily  Plan:  Aim for 2 Carb Choices per meal (30 grams) +/- 1 either way  Aim for 0-1 Carbs per snack if hungry  Include protein in moderation with your meals and snacks Consider reading food labels for Total Carbohydrate of foods Consider  increasing your activity level by walking daily as tolerated Continue checking BG at alternate times per day  Continue taking medication as directed by MD

## 2019-11-16 NOTE — Progress Notes (Signed)
Patient is here today with her husband.  History includes Newly diagnosed diabetes (dx 09/2019).  She fell in October and was on a 14 day prescription of Prednisone due to sciatica.  A1C 6.8% 12/12/2018 and increased to 8.5% 09/30/2019. Other history includes hyperlipidemia, back problems, HTN, and bipolar 2  Medications include:  Metformin and lithium  Patient lives with her husband.  He does the shopping and cooking.  She has been in bed a lot recently due to her back (8 bulging disks) and states that she is very weak and deconditioned.  She has stair and it is very difficult to get up the stairs.   Diabetes Self-Management Education  Visit Type: First/Initial  Appt. Start Time: 1100 Appt. End Time: 1230  11/16/2019  Megan Duncan, identified by name and date of birth, is a 74 y.o. female with a diagnosis of Diabetes: Type 2.   ASSESSMENT  Height 5' 2.5" (1.588 m), weight 197 lb (89.4 kg). Body mass index is 35.46 kg/m.  Diabetes Self-Management Education - 11/16/19 1120      Visit Information   Visit Type  First/Initial      Initial Visit   Diabetes Type  Type 2    Are you currently following a meal plan?  No    Are you taking your medications as prescribed?  Yes    Date Diagnosed  09/2019      Health Coping   How would you rate your overall health?  Good      Psychosocial Assessment   Patient Belief/Attitude about Diabetes  Motivated to manage diabetes    Self-care barriers  None    Self-management support  Doctor's office;Family    Other persons present  Patient;Spouse/SO    Patient Concerns  Nutrition/Meal planning;Glycemic Control;Weight Control    Special Needs  None    Preferred Learning Style  No preference indicated    Learning Readiness  Ready    How often do you need to have someone help you when you read instructions, pamphlets, or other written materials from your doctor or pharmacy?  1 - Never    What is the last grade level you completed in  school?  2 years college      Pre-Education Assessment   Patient understands the diabetes disease and treatment process.  Needs Instruction    Patient understands incorporating nutritional management into lifestyle.  Needs Instruction    Patient undertands incorporating physical activity into lifestyle.  Needs Instruction    Patient understands using medications safely.  Needs Instruction    Patient understands monitoring blood glucose, interpreting and using results  Needs Instruction    Patient understands prevention, detection, and treatment of acute complications.  Needs Instruction    Patient understands prevention, detection, and treatment of chronic complications.  Needs Instruction    Patient understands how to develop strategies to address psychosocial issues.  Needs Instruction    Patient understands how to develop strategies to promote health/change behavior.  Needs Instruction      Complications   Last HgB A1C per patient/outside source  8.5 %   09/2019 increased from 6.8% 11/2018   How often do you check your blood sugar?  3-4 times/day    Fasting Blood glucose range (mg/dL)  70-129    Postprandial Blood glucose range (mg/dL)  70-129;130-179   generally <150   Number of hypoglycemic episodes per month  0    Number of hyperglycemic episodes per week  0    Have  you had a dilated eye exam in the past 12 months?  Yes    Have you had a dental exam in the past 12 months?  Yes    Are you checking your feet?  No      Dietary Intake   Breakfast  cottage cheese, blueberries or canteloupe OR eggs, berries    Snack (morning)  none    Lunch  Zoe's chicken kabob, broccoli, brown rice OR salad with [place] or cheese, fruit OR Glucera    Dinner  Habachi chicken, rice, vegetables OR grilled chicken or steak, broccoli, brown rice, spray I can't believe it's not butter, melon, Breyer's carb smart (1 1/2 cups), hummus and snow peas OR Glucera    Snack (evening)  occasional triscuits and cheese     Beverage(s)  water, unsweetened tea, coffee with cream, Swerve (sugar alcohol)      Exercise   Exercise Type  ADL's    How many days per week to you exercise?  0    How many minutes per day do you exercise?  0    Total minutes per week of exercise  0      Patient Education   Previous Diabetes Education  No    Disease state   Definition of diabetes, type 1 and 2, and the diagnosis of diabetes    Nutrition management   Food label reading, portion sizes and measuring food.;Role of diet in the treatment of diabetes and the relationship between the three main macronutrients and blood glucose level;Carbohydrate counting;Meal options for control of blood glucose level and chronic complications.    Physical activity and exercise   Role of exercise on diabetes management, blood pressure control and cardiac health.;Helped patient identify appropriate exercises in relation to his/her diabetes, diabetes complications and other health issue.    Medications  Reviewed patients medication for diabetes, action, purpose, timing of dose and side effects.    Acute complications  Taught treatment of hypoglycemia - the 15 rule.;Discussed and identified patients' treatment of hyperglycemia.    Chronic complications  Relationship between chronic complications and blood glucose control;Dental care;Reviewed with patient heart disease, higher risk of, and prevention;Lipid levels, blood glucose control and heart disease;Assessed and discussed foot care and prevention of foot problems;Identified and discussed with patient  current chronic complications    Psychosocial adjustment  Worked with patient to identify barriers to care and solutions;Role of stress on diabetes      Individualized Goals (developed by patient)   Nutrition  General guidelines for healthy choices and portions discussed;Follow meal plan discussed    Physical Activity  Exercise 3-5 times per week;15 minutes per day    Medications  take my medication as  prescribed    Monitoring   test my blood glucose as discussed    Reducing Risk  do foot checks daily;get labs drawn;examine blood glucose patterns;increase portions of nuts and seeds;increase portions of healthy fats    Health Coping  discuss diabetes with (comment)   MD, RD, CDE, counselor     Post-Education Assessment   Patient understands the diabetes disease and treatment process.  Demonstrates understanding / competency    Patient understands incorporating nutritional management into lifestyle.  Demonstrates understanding / competency    Patient undertands incorporating physical activity into lifestyle.  Demonstrates understanding / competency    Patient understands using medications safely.  Demonstrates understanding / competency    Patient understands monitoring blood glucose, interpreting and using results  Demonstrates understanding / competency  Patient understands prevention, detection, and treatment of acute complications.  Demonstrates understanding / competency    Patient understands prevention, detection, and treatment of chronic complications.  Demonstrates understanding / competency    Patient understands how to develop strategies to address psychosocial issues.  Demonstrates understanding / competency    Patient understands how to develop strategies to promote health/change behavior.  Demonstrates understanding / competency      Outcomes   Expected Outcomes  Demonstrated interest in learning. Expect positive outcomes    Future DMSE  PRN    Program Status  Completed       Individualized Plan for Diabetes Self-Management Training:   Learning Objective:  Patient will have a greater understanding of diabetes self-management. Patient education plan is to attend individual and/or group sessions per assessed needs and concerns.   Plan:   Patient Instructions  Consider 1000-2000 units of vitamin D daily  Plan:  Aim for 2 Carb Choices per meal (30 grams) +/- 1 either  way  Aim for 0-1 Carbs per snack if hungry  Include protein in moderation with your meals and snacks Consider reading food labels for Total Carbohydrate of foods Consider  increasing your activity level by walking daily as tolerated Continue checking BG at alternate times per day  Continue taking medication as directed by MD      Expected Outcomes:  Demonstrated interest in learning. Expect positive outcomes  Education material provided: ADA - How to Thrive: A Guide for Your Journey with Diabetes, Food label handouts, Meal plan card and Snack sheet  If problems or questions, patient to contact team via:  Phone and Email  Future DSME appointment: PRN

## 2019-11-17 ENCOUNTER — Ambulatory Visit (INDEPENDENT_AMBULATORY_CARE_PROVIDER_SITE_OTHER): Payer: Medicare Other | Admitting: Physician Assistant

## 2019-11-17 ENCOUNTER — Encounter: Payer: Self-pay | Admitting: Physician Assistant

## 2019-11-17 DIAGNOSIS — E032 Hypothyroidism due to medicaments and other exogenous substances: Secondary | ICD-10-CM

## 2019-11-17 DIAGNOSIS — F3181 Bipolar II disorder: Secondary | ICD-10-CM | POA: Diagnosis not present

## 2019-11-17 MED ORDER — DULOXETINE HCL 20 MG PO CPEP: 40.0000 mg | ORAL_CAPSULE | Freq: Every day | ORAL | 1 refills | Status: DC

## 2019-11-17 MED ORDER — PROPRANOLOL HCL 10 MG PO TABS: 10.0000 mg | ORAL_TABLET | Freq: Two times a day (BID) | ORAL | 1 refills | Status: DC

## 2019-11-17 NOTE — Progress Notes (Signed)
[facility]  Patient ID: Megan Duncan,  MRN: [id number]  PCP: Ann Held, DO  Date of Evaluation: 11/17/2019 Time spent:30 minutes  Chief Complaint:  Chief Complaint    Depression; Follow-up     Virtual Visit via Telephone Note  I connected with patient by a video enabled telemedicine application or telephone, with their informed consent, and verified patient privacy and that I am speaking with the correct person using two identifiers.  I am private, in my office and the patient is home. Pt unable to do televisit on Zoom or WebEx.  I discussed the limitations, risks, security and privacy concerns of performing an evaluation and management service by telephone and the availability of in person appointments. I also discussed with the patient that there may be a patient responsible charge related to this service. The patient expressed understanding and agreed to proceed.   I discussed the assessment and treatment plan with the patient. The patient was provided an opportunity to ask questions and all were answered. The patient agreed with the plan and demonstrated an understanding of the instructions.   The patient was advised to call back or seek an in-person evaluation if the symptoms worsen or if the condition fails to improve as anticipated.  I provided 30 minutes of non-face-to-face time during this encounter.  HISTORY/CURRENT STATUS: HPI For routine med check.  See ROS.  Except for the pain, she feels like she's doing well.  "I've been fine mentally. The meds are working just fine." Enjoys reading.  Energy and motivation are ok, under the circumstances. No SI/HI.  Patient denies increased energy with decreased need for sleep, no increased talkativeness, no racing thoughts, no impulsivity or risky behaviors, no increased spending, no increased libido, no grandiosity. No hallucinations.  She is not having a lot of anxiety even with the new diagnosis of  diabetes and her chronic pain.  She has talked with the dietitian to get help with handling the diabetes.  "Having a plan to work with is good.  I know more of what I am supposed to eat and have a goal to get up every 30 minutes and just walk around my house for a few minutes.  I know that we will help with my muscle weakness and also strengthen my back."  She sleeps pretty well most of the time.  If she takes the Zanaflex, it makes her really sleepy so she avoids it much of the time unless she can go to sleep.  Review of Systems  Constitutional: Negative.   HENT: Negative.   Eyes: Negative.   Respiratory: Negative.   Cardiovascular: Negative.   Gastrointestinal: Negative.   Genitourinary: Negative.   Musculoskeletal: Positive for back pain and myalgias.  Skin: Negative.   Neurological: Positive for focal weakness and weakness.  Endo/Heme/Allergies: Negative.   Psychiatric/Behavioral: Negative.      Individual Medical History/ Review of Systems: Changes? 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 In a lot of pain and is weak b/c she's been in bed for weeks now, b/c of her back. Also dx w/ DM.  Put on Metformin.  Allergies: Atorvastatin, Codeine, Diclofenac, Doxycycline, and Sulfonamide derivatives  Current Medications:  Current Outpatient Medications:  .  albuterol (VENTOLIN HFA) 108 (90 Base) MCG/ACT inhaler, Inhale 2 puffs into the lungs every 4 (four) hours as needed. For shortness of breath and wheezing, Disp: 18 g, Rfl: 3 .  amLODipine (  NORVASC) 5 MG tablet, TAKE 1 TABLET BY MOUTH EVERY DAY, Disp: 90 tablet, Rfl: 1 .  blood glucose meter kit and supplies, Dispense based on patient and insurance preference. Use once a day.  DX Code: E11.9, Disp: 1 each, Rfl: 0 .  budesonide-formoterol (SYMBICORT) 80-4.5 MCG/ACT inhaler, Inhale 2 puffs into the lungs 2 (two) times daily., Disp: 1 Inhaler, Rfl: 0 .  clorazepate (TRANXENE) 3.75 MG tablet, Take 1 tablet  (3.75 mg total) by mouth 2 (two) times daily as needed for anxiety., Disp: 180 tablet, Rfl: 1 .  DULoxetine (CYMBALTA) 20 MG capsule, Take 2 capsules (40 mg total) by mouth daily. Take w/ 60 mg=100 mg, Disp: 180 capsule, Rfl: 1 .  DULoxetine (CYMBALTA) 60 MG capsule, TAKE 1 CAPSULE BY MOUTH EVERY MORNING, Disp: 90 capsule, Rfl: 0 .  glucose blood test strip, Use as instructed once a day.  Dx Code: E11.9, Disp: 100 each, Rfl: 12 .  Lancets MISC, Use as directed once a day.  Dx code: E11.9, Disp: 100 each, Rfl: 1 .  levothyroxine (SYNTHROID) 25 MCG tablet, TAKE 1 TABLET (25 MCG TOTAL) BY MOUTH DAILY BEFORE BREAKFAST., Disp: 90 tablet, Rfl: 1 .  lithium carbonate 300 MG capsule, 2 capsules hs, Disp: 180 capsule, Rfl: 0 .  metFORMIN (GLUCOPHAGE) 500 MG tablet, Take 1 tablet (500 mg total) by mouth 2 (two) times daily with a meal., Disp: 60 tablet, Rfl: 3 .  omega-3 acid ethyl esters (LOVAZA) 1 g capsule, Take by mouth 2 (two) times daily., Disp: , Rfl:  .  propranolol (INDERAL) 10 MG tablet, Take 1 tablet (10 mg total) by mouth 2 (two) times daily., Disp: 180 tablet, Rfl: 1 .  vitamin B-12 (CYANOCOBALAMIN) 100 MCG tablet, Take 100 mcg by mouth daily., Disp: , Rfl:  .  cyclobenzaprine (FLEXERIL) 10 MG tablet, Take 1 tablet (10 mg total) by mouth 3 (three) times daily as needed. For muscle spasms (Patient not taking: Reported on 11/17/2019), Disp: 30 tablet, Rfl: 0 .  hydrocortisone-pramoxine (ANALPRAM HC) 2.5-1 % rectal cream, Place 1 application rectally 3 (three) times daily. (Patient taking differently: Place 1 application rectally as needed. ), Disp: 30 g, Rfl: 3 .  nystatin cream (MYCOSTATIN), Apply 1 application topically 2 (two) times daily. (Patient not taking: Reported on 09/11/2019), Disp: 30 g, Rfl: 0 .  tiZANidine (ZANAFLEX) 4 MG capsule, , Disp: , Rfl:  .  topiramate (TOPAMAX) 25 MG tablet, Take 1 tablet (25 mg total) by mouth 2 (two) times daily. (Patient not taking: Reported on 10/12/2019),  Disp: 60 tablet, Rfl: 1 .  traMADol (ULTRAM) 50 MG tablet, 1-2 po q6h prn (Patient not taking: Reported on 10/12/2019), Disp: 60 tablet, Rfl: 1 Medication Side Effects: none  Family Medical/ Social History: Changes? No  MENTAL HEALTH EXAM:  There were no vitals taken for this visit.There is no height or weight on file to calculate BMI.  General Appearance: unable to assess  Eye Contact:  unable to assess  Speech:  Clear and Coherent  Volume:  Normal  Mood:  Euthymic  Affect:  unable to assess  Thought Process:  Goal Directed and Descriptions of Associations: Intact  Orientation:  Full (Time, Place, and Person)  Thought Content: Logical   Suicidal Thoughts:  No  Homicidal Thoughts:  No  Memory:  WNL  Judgement:  Good  Insight:  Good  Psychomotor Activity:  unable to assess  Concentration:  Concentration: Good  Recall:  Good  Fund of Knowledge: Good  Language:  Good  Assets:  Desire for Improvement  ADL's:  Intact  Cognition: WNL  Prognosis:  Good    DIAGNOSES:    ICD-10-CM   1. 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 Continue Tranxene 3.75 mg 1 twice daily as needed. Continue Cymbalta 20 mg, 2 p.o. daily with 60 mg pills to equal 100 mg. Continue Cymbalta 60 mg, 1 p.o. daily with 40 mg to equal 100 mg. Continue lithium 300 mg, 2 p.o. nightly. Continue Inderal 10 mg 1 twice daily. Continue fish oil, B complex and multivitamin. Return in 3 months.  Donnal Moat, PA-C

## 2019-12-03 ENCOUNTER — Other Ambulatory Visit: Payer: Self-pay | Admitting: Physician Assistant

## 2019-12-16 DIAGNOSIS — G894 Chronic pain syndrome: Secondary | ICD-10-CM | POA: Diagnosis not present

## 2019-12-16 DIAGNOSIS — M545 Low back pain: Secondary | ICD-10-CM | POA: Diagnosis not present

## 2019-12-16 DIAGNOSIS — M6281 Muscle weakness (generalized): Secondary | ICD-10-CM | POA: Diagnosis not present

## 2019-12-16 DIAGNOSIS — M5441 Lumbago with sciatica, right side: Secondary | ICD-10-CM | POA: Diagnosis not present

## 2019-12-21 ENCOUNTER — Ambulatory Visit: Payer: Medicare Other | Attending: Internal Medicine

## 2019-12-21 DIAGNOSIS — Z20822 Contact with and (suspected) exposure to covid-19: Secondary | ICD-10-CM

## 2019-12-22 LAB — NOVEL CORONAVIRUS, NAA: SARS-CoV-2, NAA: NOT DETECTED

## 2019-12-23 DIAGNOSIS — M5441 Lumbago with sciatica, right side: Secondary | ICD-10-CM | POA: Diagnosis not present

## 2019-12-23 DIAGNOSIS — G894 Chronic pain syndrome: Secondary | ICD-10-CM | POA: Diagnosis not present

## 2019-12-23 DIAGNOSIS — M6281 Muscle weakness (generalized): Secondary | ICD-10-CM | POA: Diagnosis not present

## 2019-12-23 DIAGNOSIS — M545 Low back pain: Secondary | ICD-10-CM | POA: Diagnosis not present

## 2019-12-24 ENCOUNTER — Ambulatory Visit: Payer: Medicare Other | Attending: Internal Medicine

## 2019-12-24 DIAGNOSIS — Z23 Encounter for immunization: Secondary | ICD-10-CM | POA: Insufficient documentation

## 2019-12-24 NOTE — Progress Notes (Signed)
   Covid-19 Vaccination [facility]  Name:  Megan Duncan    MRN: [id number] DOB: 1946/11/06  12/24/2019  [name] was observed post Covid-19 immunization for 15 minutes without incidence. She was provided with Vaccine Information Sheet and instruction to access the V-Safe system.   [name] was instructed to call 911 with any severe reactions post vaccine: Marland Kitchen Difficulty breathing  . Swelling of your face and throat  . A fast heartbeat  . A bad rash all over your body  . Dizziness and weakness    Immunizations Administered    Name Date Dose VIS Date Route   Pfizer COVID-19 Vaccine 12/24/2019  1:54 PM 0.3 mL 10/23/2019 Intramuscular   Manufacturer: [place]   Lot: [id number]   [place]: [id number]

## 2020-01-01 ENCOUNTER — Other Ambulatory Visit: Payer: Self-pay | Admitting: Family Medicine

## 2020-01-05 DIAGNOSIS — M6281 Muscle weakness (generalized): Secondary | ICD-10-CM | POA: Diagnosis not present

## 2020-01-05 DIAGNOSIS — M47816 Spondylosis without myelopathy or radiculopathy, lumbar region: Secondary | ICD-10-CM | POA: Diagnosis not present

## 2020-01-05 DIAGNOSIS — M545 Low back pain: Secondary | ICD-10-CM | POA: Diagnosis not present

## 2020-01-05 DIAGNOSIS — M5136 Other intervertebral disc degeneration, lumbar region: Secondary | ICD-10-CM | POA: Diagnosis not present

## 2020-01-05 DIAGNOSIS — M5441 Lumbago with sciatica, right side: Secondary | ICD-10-CM | POA: Diagnosis not present

## 2020-01-05 DIAGNOSIS — G894 Chronic pain syndrome: Secondary | ICD-10-CM | POA: Diagnosis not present

## 2020-01-08 ENCOUNTER — Telehealth: Payer: Self-pay | Admitting: Family Medicine

## 2020-01-08 ENCOUNTER — Other Ambulatory Visit: Payer: Self-pay

## 2020-01-08 ENCOUNTER — Other Ambulatory Visit (INDEPENDENT_AMBULATORY_CARE_PROVIDER_SITE_OTHER): Payer: Medicare Other

## 2020-01-08 DIAGNOSIS — E1165 Type 2 diabetes mellitus with hyperglycemia: Secondary | ICD-10-CM | POA: Diagnosis not present

## 2020-01-08 DIAGNOSIS — M6281 Muscle weakness (generalized): Secondary | ICD-10-CM | POA: Diagnosis not present

## 2020-01-08 DIAGNOSIS — E1169 Type 2 diabetes mellitus with other specified complication: Secondary | ICD-10-CM

## 2020-01-08 DIAGNOSIS — G894 Chronic pain syndrome: Secondary | ICD-10-CM | POA: Diagnosis not present

## 2020-01-08 DIAGNOSIS — E785 Hyperlipidemia, unspecified: Secondary | ICD-10-CM | POA: Diagnosis not present

## 2020-01-08 DIAGNOSIS — M545 Low back pain: Secondary | ICD-10-CM | POA: Diagnosis not present

## 2020-01-08 DIAGNOSIS — M5441 Lumbago with sciatica, right side: Secondary | ICD-10-CM | POA: Diagnosis not present

## 2020-01-08 LAB — LIPID PANEL
Cholesterol: 206 mg/dL — ABNORMAL HIGH (ref 0–200)
HDL: 63.2 mg/dL (ref >39.00)
LDL Cholesterol: 118 mg/dL — ABNORMAL HIGH (ref 0–99)
NonHDL: 142.59
Total CHOL/HDL Ratio: 3
Triglycerides: 125 mg/dL (ref 0.0–149.0)
VLDL: 25 mg/dL (ref 0.0–40.0)

## 2020-01-08 LAB — COMPREHENSIVE METABOLIC PANEL WITH GFR
ALT: 16 U/L (ref 0–35)
AST: 16 U/L (ref 0–37)
Albumin: 4.5 g/dL (ref 3.5–5.2)
Alkaline Phosphatase: 54 U/L (ref 39–117)
BUN: 14 mg/dL (ref 6–23)
CO2: 25 meq/L (ref 19–32)
Calcium: 10.1 mg/dL (ref 8.4–10.5)
Chloride: 106 meq/L (ref 96–112)
Creatinine, Ser: 0.78 mg/dL (ref 0.40–1.20)
GFR: 72.26 mL/min
Glucose, Bld: 164 mg/dL — ABNORMAL HIGH (ref 70–99)
Potassium: 4.5 meq/L (ref 3.5–5.1)
Sodium: 140 meq/L (ref 135–145)
Total Bilirubin: 0.5 mg/dL (ref 0.2–1.2)
Total Protein: 7.2 g/dL (ref 6.0–8.3)

## 2020-01-08 LAB — HEMOGLOBIN A1C: Hgb A1c MFr Bld: 6.1 % (ref 4.6–6.5)

## 2020-01-08 LAB — MICROALBUMIN / CREATININE URINE RATIO
Creatinine,U: 164.7 mg/dL
Microalb Creat Ratio: 1.3 mg/g (ref 0.0–30.0)
Microalb, Ur: 2.2 mg/dL — ABNORMAL HIGH (ref 0.0–1.9)

## 2020-01-08 NOTE — Telephone Encounter (Signed)
Medication: Lancets MISC  (Unistick 3 Gentle)  glucose blood test strip (One touch verio)   PT needs to be informed how often she be checking  her sugar. Right now she is taking it before and after every meal & at Bedtime.   Please advise patient    Has the patient contacted their pharmacy? No. (If no, request that the patient contact the pharmacy for the refill.) (If yes, when and what did the pharmacy advise?)  Preferred Pharmacy (with phone number or street name):  [facility], [place] to Registered [place] [address] Phone:  [phone]  Fax:  [phone]      Agent: Please be advised that RX refills may take up to 3 business days. We ask that you follow-up with your pharmacy.

## 2020-01-08 NOTE — Addendum Note (Signed)
Addended by: Kelle Darting A on: 01/08/2020 10:03 AM   Modules accepted: Orders

## 2020-01-08 NOTE — Addendum Note (Signed)
Addended by: Kelle Darting A on: 01/08/2020 10:02 AM   Modules accepted: Orders

## 2020-01-08 NOTE — Telephone Encounter (Signed)
Pt asked me not to send lancets or test strips until Lowne confirmed when and how much she should be testing. Please advise.

## 2020-01-11 MED ORDER — GLUCOSE BLOOD VI STRP: ORAL_STRIP | 2 refills | Status: DC

## 2020-01-11 MED ORDER — LANCETS MISC: 2 refills | Status: DC

## 2020-01-11 NOTE — Telephone Encounter (Signed)
Lancets and test strips sent pharmacy

## 2020-01-11 NOTE — Telephone Encounter (Signed)
She does not need to check more than 1x a day and ---- we can send 30 each  But she can check 2-3 x a week as long as bs are in range---- more often if running high

## 2020-01-12 DIAGNOSIS — M6281 Muscle weakness (generalized): Secondary | ICD-10-CM | POA: Diagnosis not present

## 2020-01-12 DIAGNOSIS — G894 Chronic pain syndrome: Secondary | ICD-10-CM | POA: Diagnosis not present

## 2020-01-12 DIAGNOSIS — M545 Low back pain: Secondary | ICD-10-CM | POA: Diagnosis not present

## 2020-01-12 DIAGNOSIS — M5441 Lumbago with sciatica, right side: Secondary | ICD-10-CM | POA: Diagnosis not present

## 2020-01-18 ENCOUNTER — Ambulatory Visit: Payer: Medicare Other | Attending: Internal Medicine

## 2020-01-18 DIAGNOSIS — Z23 Encounter for immunization: Secondary | ICD-10-CM | POA: Insufficient documentation

## 2020-01-18 NOTE — Progress Notes (Signed)
   Covid-19 Vaccination Clinic  Name:  Megan Duncan    MRN: [id number] DOB: [birth year]  01/18/2020  [name] was observed post Covid-19 immunization for 15 minutes without incident. She was provided with Vaccine Information Sheet and instruction to access the V-Safe system.   [name] was instructed to call 911 with any severe reactions post vaccine: Marland Kitchen Difficulty breathing  . Swelling of face and throat  . A fast heartbeat  . A bad rash all over body  . Dizziness and weakness   Immunizations Administered    Name Date Dose VIS Date Route   Pfizer COVID-19 Vaccine 01/18/2020  8:31 AM 0.3 mL 10/23/2019 Intramuscular   Manufacturer: [place]   Lot: [id number]   [place]: [id number]

## 2020-01-21 DIAGNOSIS — M6281 Muscle weakness (generalized): Secondary | ICD-10-CM | POA: Diagnosis not present

## 2020-01-21 DIAGNOSIS — M545 Low back pain: Secondary | ICD-10-CM | POA: Diagnosis not present

## 2020-01-21 DIAGNOSIS — G894 Chronic pain syndrome: Secondary | ICD-10-CM | POA: Diagnosis not present

## 2020-01-21 DIAGNOSIS — M5441 Lumbago with sciatica, right side: Secondary | ICD-10-CM | POA: Diagnosis not present

## 2020-01-27 ENCOUNTER — Telehealth: Payer: Self-pay | Admitting: Physician Assistant

## 2020-01-27 NOTE — Telephone Encounter (Signed)
Clarified her dose Duloxetine 20 mg take 2 capsules daily with Duloxetine 60 mg 1 daily. She verbalized understanding.

## 2020-01-27 NOTE — Telephone Encounter (Signed)
Pt confused about the amount of Cymbalta she is suppose to take a day. Pt thinks it was changed at last appt. Please call to verify.

## 2020-02-12 ENCOUNTER — Other Ambulatory Visit: Payer: Self-pay | Admitting: Family Medicine

## 2020-02-12 DIAGNOSIS — I10 Essential (primary) hypertension: Secondary | ICD-10-CM

## 2020-02-16 ENCOUNTER — Telehealth: Payer: Self-pay | Admitting: Physician Assistant

## 2020-02-16 ENCOUNTER — Encounter: Payer: Self-pay | Admitting: Physician Assistant

## 2020-02-16 ENCOUNTER — Ambulatory Visit (INDEPENDENT_AMBULATORY_CARE_PROVIDER_SITE_OTHER): Payer: Medicare Other | Admitting: Physician Assistant

## 2020-02-16 ENCOUNTER — Telehealth: Payer: Self-pay

## 2020-02-16 DIAGNOSIS — M47816 Spondylosis without myelopathy or radiculopathy, lumbar region: Secondary | ICD-10-CM | POA: Diagnosis not present

## 2020-02-16 DIAGNOSIS — Z79899 Other long term (current) drug therapy: Secondary | ICD-10-CM | POA: Diagnosis not present

## 2020-02-16 DIAGNOSIS — F3181 Bipolar II disorder: Secondary | ICD-10-CM | POA: Diagnosis not present

## 2020-02-16 DIAGNOSIS — F419 Anxiety disorder, unspecified: Secondary | ICD-10-CM | POA: Diagnosis not present

## 2020-02-16 DIAGNOSIS — E032 Hypothyroidism due to medicaments and other exogenous substances: Secondary | ICD-10-CM

## 2020-02-16 DIAGNOSIS — M5136 Other intervertebral disc degeneration, lumbar region: Secondary | ICD-10-CM | POA: Diagnosis not present

## 2020-02-16 DIAGNOSIS — M545 Low back pain: Secondary | ICD-10-CM | POA: Diagnosis not present

## 2020-02-16 MED ORDER — CLORAZEPATE DIPOTASSIUM 3.75 MG PO TABS: 3.7500 mg | ORAL_TABLET | Freq: Two times a day (BID) | ORAL | 0 refills | Status: DC | PRN

## 2020-02-16 NOTE — Telephone Encounter (Signed)
Prior authorization submitted and approved for CLORAZEPATE DIPOTASSIUM 3.75 MG #180/90 DAY, effective 02/16/2020-04/106/2022 with [facility] Medicare Part D.  Submitted through cover my meds

## 2020-02-16 NOTE — Progress Notes (Signed)
[facility]  Patient ID: Megan Duncan,  MRN: [id number]  PCP: Ann Held, DO  Date of Evaluation: 02/16/2020 Time spent:30 minutes  Chief Complaint:  Chief Complaint    Anxiety; Depression     Virtual Visit via Telephone Note  I connected with patient by a video enabled telemedicine application or telephone, with their informed consent, and verified patient privacy and that I am speaking with the correct person using two identifiers.  I am private, in my office and the patient is home. Pt unable to do televisit on Zoom or WebEx.  I discussed the limitations, risks, security and privacy concerns of performing an evaluation and management service by telephone and the availability of in person appointments. I also discussed with the patient that there may be a patient responsible charge related to this service. The patient expressed understanding and agreed to proceed.   I discussed the assessment and treatment plan with the patient. The patient was provided an opportunity to ask questions and all were answered. The patient agreed with the plan and demonstrated an understanding of the instructions.   The patient was advised to call back or seek an in-person evaluation if the symptoms worsen or if the condition fails to improve as anticipated.  I provided 30 minutes of non-face-to-face time during this encounter.  HISTORY/CURRENT STATUS: HPI For routine med check.  Doing well overall.  Still having a lot of back probs.  Has 8 bulging discs and spinal stenosis. Had RFA several months ago and that has helped.  She still can't do much at a time, like empty the dishwasher for example. But overall pain is better.   Patient denies loss of interest in usual activities and is able to enjoy things.  Denies decreased energy or motivation.  Appetite has not changed.  No extreme sadness, tearfulness, or feelings of hopelessness.  Denies any changes in concentration, making  decisions or remembering things.  Denies suicidal or homicidal thoughts.  Patient denies increased energy with decreased need for sleep, no increased talkativeness, no racing thoughts, no impulsivity or risky behaviors, no increased spending, no increased libido, no grandiosity. No increased irritability. No hallucinations.  Anxiety is controlled for the most part. Does take the Tranxene and needs a RF. It helps a lot. Sleeps good.   Denies dizziness, syncope, seizures, numbness, tingling, tremor, tics, unsteady gait, slurred speech, confusion. Has chronic low back pain,  stiffness, or dystonia.  Individual Medical History/ Review of Systems: Changes? :No    Past medications for mental health diagnoses include: Trileptal Lamictal Prozac Zoloft Effexor XR, Cymbalta, Paxil, lithium, Depakote, Latuda, Risperdal, Tegretol, Wellbutrin  Allergies: Atorvastatin, Codeine, Diclofenac, Doxycycline, and Sulfonamide derivatives  Current Medications:  Current Outpatient Medications:  .  albuterol (VENTOLIN HFA) 108 (90 Base) MCG/ACT inhaler, Inhale 2 puffs into the lungs every 4 (four) hours as needed. For shortness of breath and wheezing, Disp: 18 g, Rfl: 3 .  amLODipine (NORVASC) 5 MG tablet, TAKE 1 TABLET BY MOUTH EVERY DAY, Disp: 90 tablet, Rfl: 0 .  blood glucose meter kit and supplies, Dispense based on patient and insurance preference. Use once a day.  DX Code: E11.9, Disp: 1 each, Rfl: 0 .  budesonide-formoterol (SYMBICORT) 80-4.5 MCG/ACT inhaler, Inhale 2 puffs into the lungs 2 (two) times daily., Disp: 1 Inhaler, Rfl: 0 .  clorazepate (TRANXENE) 3.75 MG tablet, Take 1 tablet (3.75 mg total) by mouth 2 (two) times daily as needed for anxiety., Disp: 180 tablet, Rfl: 0 .  DULoxetine (CYMBALTA) 20 MG capsule, Take 2 capsules (40 mg total) by mouth daily. Take w/ 60 mg=100 mg, Disp: 180 capsule, Rfl: 1 .  DULoxetine (CYMBALTA) 60 MG capsule, TAKE 1 CAPSULE BY MOUTH EVERY MORNING, Disp: 90 capsule,  Rfl: 0 .  glucose blood test strip, Use as instructed once a day.  Dx Code: E11.9, Disp: 30 each, Rfl: 2 .  hydrocortisone-pramoxine (ANALPRAM HC) 2.5-1 % rectal cream, Place 1 application rectally 3 (three) times daily. (Patient taking differently: Place 1 application rectally as needed. ), Disp: 30 g, Rfl: 3 .  Lancets MISC, Use as directed once a day.  Dx code: E11.9, Disp: 30 each, Rfl: 2 .  levothyroxine (SYNTHROID) 25 MCG tablet, TAKE 1 TABLET (25 MCG TOTAL) BY MOUTH DAILY BEFORE BREAKFAST., Disp: 90 tablet, Rfl: 0 .  lithium carbonate 300 MG capsule, TAKE 2 CAPSULES BY MOUTH DAILY AT BEDTIME, Disp: 180 capsule, Rfl: 0 .  metFORMIN (GLUCOPHAGE) 500 MG tablet, TAKE 1 TABLET (500 MG TOTAL) BY MOUTH 2 (TWO) TIMES DAILY WITH A MEAL., Disp: 180 tablet, Rfl: 1 .  nystatin cream (MYCOSTATIN), Apply 1 application topically 2 (two) times daily., Disp: 30 g, Rfl: 0 .  propranolol (INDERAL) 10 MG tablet, Take 1 tablet (10 mg total) by mouth 2 (two) times daily., Disp: 180 tablet, Rfl: 1 .  vitamin B-12 (CYANOCOBALAMIN) 100 MCG tablet, Take 100 mcg by mouth daily., Disp: , Rfl:  .  cyclobenzaprine (FLEXERIL) 10 MG tablet, Take 1 tablet (10 mg total) by mouth 3 (three) times daily as needed. For muscle spasms (Patient not taking: Reported on 11/17/2019), Disp: 30 tablet, Rfl: 0 .  omega-3 acid ethyl esters (LOVAZA) 1 g capsule, Take by mouth 2 (two) times daily., Disp: , Rfl:  .  tiZANidine (ZANAFLEX) 4 MG capsule, , Disp: , Rfl:  .  topiramate (TOPAMAX) 25 MG tablet, Take 1 tablet (25 mg total) by mouth 2 (two) times daily. (Patient not taking: Reported on 10/12/2019), Disp: 60 tablet, Rfl: 1 .  traMADol (ULTRAM) 50 MG tablet, 1-2 po q6h prn (Patient not taking: Reported on 10/12/2019), Disp: 60 tablet, Rfl: 1 Medication Side Effects: none  Family Medical/ Social History: Changes? No  MENTAL HEALTH EXAM:  There were no vitals taken for this visit.There is no height or weight on file to calculate BMI.   General Appearance: unable to assess  Eye Contact:  unable to assess  Speech:  Clear and Coherent  Volume:  Normal  Mood:  Euthymic  Affect:  unable to assess  Thought Process:  Goal Directed and Descriptions of Associations: Intact  Orientation:  Full (Time, Place, and Person)  Thought Content: Logical   Suicidal Thoughts:  No  Homicidal Thoughts:  No  Memory:  WNL  Judgement:  Good  Insight:  Good  Psychomotor Activity:  unable to assess  Concentration:  Concentration: Good  Recall:  Good  Fund of Knowledge: Good  Language: Good  Assets:  Desire for Improvement  ADL's:  Intact  Cognition: WNL  Prognosis:  Good  01/08/2020 see on chart, HgbA1C is better.  Last Li was 09/30/2019 0.6, TSH 4.26  DIAGNOSES:    ICD-10-CM   1. Bipolar 2 disorder (HCC)  F31.81   2. 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 Anxiety  F41.9   4. Encounter for long-term (current) use of medications  Z79.899 Lithium level    TSH    Receiving Psychotherapy: No    RECOMMENDATIONS:  PDMP reviewed. I spent  30 mins w/ her.   Continue Tranxene 3.75 mg 1 twice daily as needed. Continue Cymbalta 20 mg, 2 p.o. daily with 60 mg pills to equal 100 mg. Continue Cymbalta 60 mg, 1 p.o. daily with 40 mg to equal 100 mg. Continue lithium 300 mg, 2 p.o. nightly. Continue Inderal 10 mg 1 twice daily. Continue fish oil, B complex and multivitamin. Labs ordered as above.  Return in 3 months.  Donnal Moat, PA-C

## 2020-02-16 NOTE — Telephone Encounter (Signed)
Patient called and said that she picked up her cymbalta 40 mg today it cost 14.00 dollars. She would like to know is there another medicine that is comparible to cymbalta that would cost less? Or can a PA be done? Please give her a call at [phone] and let her know her options

## 2020-02-17 NOTE — Telephone Encounter (Signed)
Spoke with patient and she has to pay $141.00/ #180 (90 day) for just her 20 mg cap duloxetine. Patient also takes 60 mg to equal 100 mg/day. Informed patient would contact her insurance [facility] Medicare Part D and see if there is anything we can do and then discuss with Helene Kelp. She was appreciative of whatever can be done. She's doing very well on this medication and really hates to change.

## 2020-02-17 NOTE — Telephone Encounter (Signed)
Contacted [facility] about the Duloxetine, they will be faxing over forms for both strengths 20 mg and 60 mg and advised to submit a tier reduction for both of these medications. They are listed as a Tier 3. They said we should submit a supporting statement that the preferred medications would not be as effective or cause adverse interaction if indicated.    The cost for both medications would be $141.00/ per each strength for a 90 day supply currently.

## 2020-02-18 NOTE — Telephone Encounter (Signed)
Patient aware we are working on a tier reduction for both strengths and I will follow up with her with their response.

## 2020-02-23 NOTE — Telephone Encounter (Signed)
Tier reduction faxed on both strengths, pending response.

## 2020-02-26 ENCOUNTER — Other Ambulatory Visit: Payer: Self-pay | Admitting: Physician Assistant

## 2020-03-01 ENCOUNTER — Telehealth: Payer: Self-pay | Admitting: Physician Assistant

## 2020-03-03 ENCOUNTER — Telehealth: Payer: Self-pay | Admitting: Physician Assistant

## 2020-03-03 NOTE — Telephone Encounter (Signed)
DULOXETINE HCL Cap DR 20 MG Approved. 180 Cap Per 90 Days.  Effective 02/24/2020 to 11/11/2020 Prior Auth Ref # [id number]

## 2020-03-03 NOTE — Telephone Encounter (Signed)
Awesome this should be her tier reduction submitted for her 20 mg Duloxetine, there's already an approval for her 60 mg.

## 2020-03-08 NOTE — Telephone Encounter (Signed)
error 

## 2020-03-15 NOTE — Telephone Encounter (Signed)
Approval for both Duloxetine's for tier reductions

## 2020-03-21 ENCOUNTER — Encounter: Payer: Self-pay | Admitting: Family Medicine

## 2020-03-21 ENCOUNTER — Ambulatory Visit (INDEPENDENT_AMBULATORY_CARE_PROVIDER_SITE_OTHER): Payer: Medicare Other | Admitting: Family Medicine

## 2020-03-21 ENCOUNTER — Ambulatory Visit: Payer: Medicare Other | Admitting: Family Medicine

## 2020-03-21 ENCOUNTER — Other Ambulatory Visit: Payer: Self-pay

## 2020-03-21 VITALS — BP 140/80 | HR 74 | Temp 97.2°F | Resp 18 | Ht 62.5 in | Wt 192.4 lb

## 2020-03-21 DIAGNOSIS — F3181 Bipolar II disorder: Secondary | ICD-10-CM | POA: Diagnosis not present

## 2020-03-21 DIAGNOSIS — E1151 Type 2 diabetes mellitus with diabetic peripheral angiopathy without gangrene: Secondary | ICD-10-CM

## 2020-03-21 DIAGNOSIS — I10 Essential (primary) hypertension: Secondary | ICD-10-CM

## 2020-03-21 DIAGNOSIS — E039 Hypothyroidism, unspecified: Secondary | ICD-10-CM | POA: Diagnosis not present

## 2020-03-21 DIAGNOSIS — IMO0002 Reserved for concepts with insufficient information to code with codable children: Secondary | ICD-10-CM

## 2020-03-21 DIAGNOSIS — E782 Mixed hyperlipidemia: Secondary | ICD-10-CM

## 2020-03-21 DIAGNOSIS — E1165 Type 2 diabetes mellitus with hyperglycemia: Secondary | ICD-10-CM

## 2020-03-21 LAB — COMPREHENSIVE METABOLIC PANEL
ALT: 13 U/L (ref 0–35)
AST: 16 U/L (ref 0–37)
Albumin: 4.4 g/dL (ref 3.5–5.2)
Alkaline Phosphatase: 55 U/L (ref 39–117)
BUN: 13 mg/dL (ref 6–23)
CO2: 27 mEq/L (ref 19–32)
Calcium: 9.9 mg/dL (ref 8.4–10.5)
Chloride: 104 mEq/L (ref 96–112)
Creatinine, Ser: 0.82 mg/dL (ref 0.40–1.20)
GFR: 68.17 mL/min (ref >60.00)
Glucose, Bld: 154 mg/dL — ABNORMAL HIGH (ref 70–99)
Potassium: 4.4 mEq/L (ref 3.5–5.1)
Sodium: 140 mEq/L (ref 135–145)
Total Bilirubin: 0.5 mg/dL (ref 0.2–1.2)
Total Protein: 7 g/dL (ref 6.0–8.3)

## 2020-03-21 LAB — TSH: TSH: 3.64 u[IU]/mL (ref 0.35–4.50)

## 2020-03-21 LAB — LIPID PANEL
Cholesterol: 218 mg/dL — ABNORMAL HIGH (ref 0–200)
HDL: 59.9 mg/dL (ref >39.00)
LDL Cholesterol: 120 mg/dL — ABNORMAL HIGH (ref 0–99)
NonHDL: 158.42
Total CHOL/HDL Ratio: 4
Triglycerides: 193 mg/dL — ABNORMAL HIGH (ref 0.0–149.0)
VLDL: 38.6 mg/dL (ref 0.0–40.0)

## 2020-03-21 LAB — HEMOGLOBIN A1C: Hgb A1c MFr Bld: 5.8 % (ref 4.6–6.5)

## 2020-03-21 NOTE — Assessment & Plan Note (Signed)
hgba1c to be checked, minimize simple carbs. Increase exercise as tolerated. Continue current meds  

## 2020-03-21 NOTE — Progress Notes (Signed)
Patient ID: Megan Duncan, female    DOB: [birth year]  Age: 74 y.o. MRN: [id number]    Subjective:  Subjective  HPI Megan Duncan presents for f/u dm, chol and htn   HYPERTENSION   Blood pressure range-not checking   Chest pain- no      Dyspnea- no Lightheadedness- no   Edema- no  Other side effects - no   Medication compliance: good Low salt diet- yes     DIABETES    Blood Sugar ranges-93-200  Polyuria- no New Visual problems- no  Hypoglycemic symptoms- no  Other side effects-no Medication compliance - good Last eye exam- due Foot exam- today   HYPERLIPIDEMIA  Medication compliance- good RUQ pain- no  Muscle aches- no Other side effects-no      Review of Systems  Constitutional: Negative for appetite change, diaphoresis, fatigue and unexpected weight change.  Eyes: Negative for pain, redness and visual disturbance.  Respiratory: Negative for cough, chest tightness, shortness of breath and wheezing.   Cardiovascular: Negative for chest pain, palpitations and leg swelling.  Endocrine: Negative for cold intolerance, heat intolerance, polydipsia, polyphagia and polyuria.  Genitourinary: Negative for difficulty urinating, dysuria and frequency.  Neurological: Negative for dizziness, light-headedness, numbness and headaches.    History Past Medical History:  Diagnosis Date  . Anxiety   . Arthritis    hands  . Bipolar II disorder ([place])    Dr. Charlott Holler  . COPD (chronic obstructive pulmonary disease) ([place])   . Depression   . Diastolic dysfunction    Per pt, diagnosed after [facility]  . Diverticulosis   . Fibromyalgia   . Hyperlipidemia   . Hypertension   . LBP (low back pain)     She has a past surgical history that includes Appendectomy; Total abdominal hysterectomy; Tonsillectomy; and Knee arthroscopy (Right, 2008).   Her family history includes Alcoholism in her father; Breast cancer in her maternal aunt; Colon cancer (age of onset: 38) in her maternal  aunt; Diabetes in her father; Heart attack in her father; Hiatal hernia in her mother; Hypertension in her father and mother; Mental illness in her sister.She reports that she quit smoking about 15 years ago. She has a 60.00 pack-year smoking history. She has never used smokeless tobacco. She reports current alcohol use. She reports that she does not use drugs.  Current Outpatient Medications on File Prior to Visit  Medication Sig Dispense Refill  . albuterol (VENTOLIN HFA) 108 (90 Base) MCG/ACT inhaler Inhale 2 puffs into the lungs every 4 (four) hours as needed. For shortness of breath and wheezing 18 g 3  . amLODipine (NORVASC) 5 MG tablet TAKE 1 TABLET BY MOUTH EVERY DAY 90 tablet 0  . blood glucose meter kit and supplies Dispense based on patient and insurance preference. Use once a day.  DX Code: E11.9 1 each 0  . budesonide-formoterol (SYMBICORT) 80-4.5 MCG/ACT inhaler Inhale 2 puffs into the lungs 2 (two) times daily. 1 Inhaler 0  . clorazepate (TRANXENE) 3.75 MG tablet Take 1 tablet (3.75 mg total) by mouth 2 (two) times daily as needed for anxiety. 180 tablet 0  . cyclobenzaprine (FLEXERIL) 10 MG tablet Take 1 tablet (10 mg total) by mouth 3 (three) times daily as needed. For muscle spasms 30 tablet 0  . DULoxetine (CYMBALTA) 20 MG capsule Take 2 capsules (40 mg total) by mouth daily. Take w/ 60 mg=100 mg 180 capsule 1  . DULoxetine (CYMBALTA) 60 MG capsule TAKE 1 CAPSULE BY MOUTH EVERY MORNING  90 capsule 0  . glucose blood test strip Use as instructed once a day.  Dx Code: E11.9 30 each 2  . hydrocortisone-pramoxine (ANALPRAM HC) 2.5-1 % rectal cream Place 1 application rectally 3 (three) times daily. (Patient taking differently: Place 1 application rectally as needed. ) 30 g 3  . Lancets MISC Use as directed once a day.  Dx code: E11.9 30 each 2  . levothyroxine (SYNTHROID) 25 MCG tablet TAKE 1 TABLET (25 MCG TOTAL) BY MOUTH DAILY BEFORE BREAKFAST. 90 tablet 0  . lithium carbonate 300 MG  capsule TAKE 2 CAPSULES BY MOUTH DAILY AT BEDTIME 180 capsule 0  . metFORMIN (GLUCOPHAGE) 500 MG tablet TAKE 1 TABLET (500 MG TOTAL) BY MOUTH 2 (TWO) TIMES DAILY WITH A MEAL. 180 tablet 1  . nystatin cream (MYCOSTATIN) Apply 1 application topically 2 (two) times daily. 30 g 0  . omega-3 acid ethyl esters (LOVAZA) 1 g capsule Take by mouth 2 (two) times daily.    . propranolol (INDERAL) 10 MG tablet Take 1 tablet (10 mg total) by mouth 2 (two) times daily. 180 tablet 1  . tiZANidine (ZANAFLEX) 4 MG capsule     . topiramate (TOPAMAX) 25 MG tablet Take 1 tablet (25 mg total) by mouth 2 (two) times daily. 60 tablet 1  . traMADol (ULTRAM) 50 MG tablet 1-2 po q6h prn 60 tablet 1  . vitamin B-12 (CYANOCOBALAMIN) 100 MCG tablet Take 100 mcg by mouth daily.     No current facility-administered medications on file prior to visit.     Objective:  Objective  Physical Exam Vitals and nursing note reviewed.  Constitutional:      Appearance: She is well-developed.  HENT:     Head: Normocephalic and atraumatic.  Eyes:     Conjunctiva/sclera: Conjunctivae normal.  Neck:     Thyroid: No thyromegaly.     Vascular: No carotid bruit or JVD.  Cardiovascular:     Rate and Rhythm: Normal rate and regular rhythm.     Heart sounds: Normal heart sounds. No murmur.  Pulmonary:     Effort: Pulmonary effort is normal. No respiratory distress.     Breath sounds: Normal breath sounds. No wheezing or rales.  Chest:     Chest wall: No tenderness.  Musculoskeletal:     Cervical back: Normal range of motion and neck supple.  Neurological:     Mental Status: She is alert and oriented to person, place, and time.    Diabetic Foot Exam - Simple   Simple Foot Form Diabetic Foot exam was performed with the following findings: Yes 03/21/2020  2:34 PM  Visual Inspection No deformities, no ulcerations, no other skin breakdown bilaterally: Yes Sensation Testing Intact to touch and monofilament testing bilaterally:  Yes Pulse Check Posterior Tibialis and Dorsalis pulse intact bilaterally: Yes Comments     BP 140/80 (BP Location: Right Arm, Patient Position: Sitting, Cuff Size: Normal)   Pulse 74   Temp (!) 97.2 F (36.2 C) (Temporal)   Resp 18   Ht 5' 2.5" (1.588 m)   Wt 192 lb 6.4 oz (87.3 kg)   SpO2 95%   BMI 34.63 kg/m  Wt Readings from Last 3 Encounters:  03/21/20 192 lb 6.4 oz (87.3 kg)  11/16/19 197 lb (89.4 kg)  12/12/18 204 lb (92.5 kg)     Lab Results  Component Value Date   WBC 7.7 08/30/2017   HGB 15.1 08/30/2017   HCT 45.4 (H) 08/30/2017   PLT 393  08/30/2017   GLUCOSE 164 (H) 01/08/2020   CHOL 206 (H) 01/08/2020   TRIG 125.0 01/08/2020   HDL 63.20 01/08/2020   LDLDIRECT 120.1 12/03/2013   LDLCALC 118 (H) 01/08/2020   ALT 16 01/08/2020   AST 16 01/08/2020   NA 140 01/08/2020   K 4.5 01/08/2020   CL 106 01/08/2020   CREATININE 0.78 01/08/2020   BUN 14 01/08/2020   CO2 25 01/08/2020   TSH 4.26 09/30/2019   INR 1.0 10/25/2015   HGBA1C 6.1 01/08/2020   MICROALBUR 2.2 (H) 01/08/2020    CT CHEST LUNG CA SCREEN LOW DOSE W/O CM  Result Date: 09/16/2019 CLINICAL DATA:  Lung cancer screening. Former asymptomatic smoker. Sixty-three pack-year history. EXAM: CT CHEST WITHOUT CONTRAST LOW-DOSE FOR LUNG CANCER SCREENING TECHNIQUE: Multidetector CT imaging of the chest was performed following the standard protocol without IV contrast. COMPARISON:  07/09/2018 FINDINGS: Cardiovascular: The heart size is normal. Aortic atherosclerosis noted. No pericardial effusion identified. Lad coronary artery atherosclerotic calcifications. Mediastinum/Nodes: No enlarged mediastinal, hilar, or axillary lymph nodes. Thyroid gland, trachea, and esophagus demonstrate no significant findings. Lungs/Pleura: No pleural effusion identified. Moderate centrilobular emphysema. No airspace consolidation. Several small calcified and noncalcified pulmonary nodules are again noted. On today's exam the largest  noncalcified nodule is in the perifissural left lower lobe with an equivalent diameter of 3.2 mm. There are no new suspicious nodules. Upper Abdomen: There is diffuse hepatic steatosis. No acute abnormality. Musculoskeletal: No chest wall mass or suspicious bone lesions identified. IMPRESSION: Lung-RADS 2, benign appearance or behavior. Continue annual screening with low-dose chest CT without contrast in 12 months. Coronary artery calcifications Hepatic steatosis. Aortic Atherosclerosis (ICD10-I70.0) and Emphysema (ICD10-J43.9). Electronically Signed   By: Kerby Moors M.D.   On: 09/16/2019 12:48     Assessment & Plan:  Plan  I am having Megan Huff "[name]" maintain her hydrocortisone-pramoxine, nystatin cream, blood glucose meter kit and supplies, albuterol, budesonide-formoterol, topiramate, tiZANidine, traMADol, cyclobenzaprine, DULoxetine, vitamin B-12, omega-3 acid ethyl esters, propranolol, DULoxetine, metFORMIN, glucose blood, Lancets, amLODipine, levothyroxine, clorazepate, and lithium carbonate.     No orders of the defined types were placed in this encounter.   Problem List Items Addressed This Visit      Unprioritized   Bipolar 2 disorder ([place])    Per psych      Relevant Orders   Lithium level   DM (diabetes mellitus) type II uncontrolled, periph vascular disorder ([place])    hgba1c to be checked, minimize simple carbs. Increase exercise as tolerated. Continue current meds       Essential hypertension    Well controlled, no changes to meds. Encouraged heart healthy diet such as the DASH diet and exercise as tolerated.       Relevant Orders   Lipid panel   Comprehensive metabolic panel   Hyperlipidemia    Encouraged heart healthy diet, increase exercise, avoid trans fats, consider a krill oil cap daily       Other Visit Diagnoses    Uncontrolled type 2 diabetes mellitus with hyperglycemia ([place])    -  Primary   Relevant Orders   Lipid panel   Hemoglobin A1c     Comprehensive metabolic panel   Hypothyroidism, unspecified type       Relevant Orders   TSH      Follow-up: Return in about 6 months (around 09/21/2020) for hypertension, hyperlipidemia, diabetes II.  Ann Held, DO

## 2020-03-21 NOTE — Assessment & Plan Note (Signed)
Well controlled, no changes to meds. Encouraged heart healthy diet such as the DASH diet and exercise as tolerated.  °

## 2020-03-21 NOTE — Assessment & Plan Note (Signed)
Encouraged heart healthy diet, increase exercise, avoid trans fats, consider a krill oil cap daily 

## 2020-03-21 NOTE — Assessment & Plan Note (Signed)
Per psych 

## 2020-03-21 NOTE — Patient Instructions (Signed)
Carbohydrate Counting for Diabetes Mellitus, Adult  Carbohydrate counting is a method of keeping track of how many carbohydrates you eat. Eating carbohydrates naturally increases the amount of sugar (glucose) in the blood. Counting how many carbohydrates you eat helps keep your blood glucose within normal limits, which helps you manage your diabetes (diabetes mellitus). It is important to know how many carbohydrates you can safely have in each meal. This is different for every person. A diet and nutrition specialist (registered dietitian) can help you make a meal plan and calculate how many carbohydrates you should have at each meal and snack. Carbohydrates are found in the following foods:  Grains, such as breads and cereals.  Dried beans and soy products.  Starchy vegetables, such as potatoes, peas, and corn.  Fruit and fruit juices.  Milk and yogurt.  Sweets and snack foods, such as cake, cookies, candy, chips, and soft drinks. How do I count carbohydrates? There are two ways to count carbohydrates in food. You can use either of the methods or a combination of both. Reading "Nutrition Facts" on packaged food The "Nutrition Facts" list is included on the labels of almost all packaged foods and beverages in the [place] It includes:  The serving size.  Information about nutrients in each serving, including the grams (g) of carbohydrate per serving. To use the "Nutrition Facts":  Decide how many servings you will have.  Multiply the number of servings by the number of carbohydrates per serving.  The resulting number is the total amount of carbohydrates that you will be having. Learning standard serving sizes of other foods When you eat carbohydrate foods that are not packaged or do not include "Nutrition Facts" on the label, you need to measure the servings in order to count the amount of carbohydrates:  Measure the foods that you will eat with a food scale or measuring cup, if  needed.  Decide how many standard-size servings you will eat.  Multiply the number of servings by 15. Most carbohydrate-rich foods have about 15 g of carbohydrates per serving. ? For example, if you eat 8 oz (170 g) of strawberries, you will have eaten 2 servings and 30 g of carbohydrates (2 servings x 15 g = 30 g).  For foods that have more than one food mixed, such as soups and casseroles, you must count the carbohydrates in each food that is included. The following list contains standard serving sizes of common carbohydrate-rich foods. Each of these servings has about 15 g of carbohydrates:   hamburger bun or  English muffin.   oz (15 mL) syrup.   oz (14 g) jelly.  1 slice of bread.  1 six-inch tortilla.  3 oz (85 g) cooked rice or pasta.  4 oz (113 g) cooked dried beans.  4 oz (113 g) starchy vegetable, such as peas, corn, or potatoes.  4 oz (113 g) hot cereal.  4 oz (113 g) mashed potatoes or  of a large baked potato.  4 oz (113 g) canned or frozen fruit.  4 oz (120 mL) fruit juice.  4-6 crackers.  6 chicken nuggets.  6 oz (170 g) unsweetened dry cereal.  6 oz (170 g) plain fat-free yogurt or yogurt sweetened with artificial sweeteners.  8 oz (240 mL) milk.  8 oz (170 g) fresh fruit or one small piece of fruit.  24 oz (680 g) popped popcorn. Example of carbohydrate counting Sample meal  3 oz (85 g) chicken breast.  6 oz (170 g)   brown rice.  4 oz (113 g) corn.  8 oz (240 mL) milk.  8 oz (170 g) strawberries with sugar-free whipped topping. Carbohydrate calculation 1. Identify the foods that contain carbohydrates: ? Rice. ? Corn. ? Milk. ? Strawberries. 2. Calculate how many servings you have of each food: ? 2 servings rice. ? 1 serving corn. ? 1 serving milk. ? 1 serving strawberries. 3. Multiply each number of servings by 15 g: ? 2 servings rice x 15 g = 30 g. ? 1 serving corn x 15 g = 15 g. ? 1 serving milk x 15 g = 15 g. ? 1  serving strawberries x 15 g = 15 g. 4. Add together all of the amounts to find the total grams of carbohydrates eaten: ? 30 g + 15 g + 15 g + 15 g = 75 g of carbohydrates total. Summary  Carbohydrate counting is a method of keeping track of how many carbohydrates you eat.  Eating carbohydrates naturally increases the amount of sugar (glucose) in the blood.  Counting how many carbohydrates you eat helps keep your blood glucose within normal limits, which helps you manage your diabetes.  A diet and nutrition specialist (registered dietitian) can help you make a meal plan and calculate how many carbohydrates you should have at each meal and snack. This information is not intended to replace advice given to you by your health care provider. Make sure you discuss any questions you have with your health care provider. Document Revised: 05/23/2017 Document Reviewed: 04/11/2016 Elsevier Patient Education  2020 [employer]  

## 2020-03-22 LAB — LITHIUM LEVEL: Lithium Lvl: 0.6 mmol/L (ref 0.6–1.2)

## 2020-03-23 ENCOUNTER — Other Ambulatory Visit: Payer: Self-pay | Admitting: Family Medicine

## 2020-03-23 ENCOUNTER — Telehealth: Payer: Self-pay | Admitting: Physician Assistant

## 2020-03-23 DIAGNOSIS — E785 Hyperlipidemia, unspecified: Secondary | ICD-10-CM

## 2020-03-23 NOTE — Telephone Encounter (Signed)
Patient called and said that she had all of her labs done including the lithium which was 0.6. She says that it is all in my chart so teresa should be able to see it. If she can't see it please let dottie know.

## 2020-03-23 NOTE — Telephone Encounter (Signed)
Let her know I did review the labs.  I appreciate her calling to let me know they were done.  Her BUN and creatinine are normal, TSH is normal and lithium level is fine.  Stay on all current meds.  Thanks

## 2020-03-24 NOTE — Telephone Encounter (Signed)
Left detailed message with information and to call back with any questions or concerns. 

## 2020-03-26 ENCOUNTER — Other Ambulatory Visit: Payer: Self-pay | Admitting: Physician Assistant

## 2020-04-14 ENCOUNTER — Telehealth: Payer: Self-pay | Admitting: Family Medicine

## 2020-04-14 DIAGNOSIS — M47816 Spondylosis without myelopathy or radiculopathy, lumbar region: Secondary | ICD-10-CM | POA: Diagnosis not present

## 2020-04-14 DIAGNOSIS — M545 Low back pain: Secondary | ICD-10-CM | POA: Diagnosis not present

## 2020-04-14 DIAGNOSIS — M5136 Other intervertebral disc degeneration, lumbar region: Secondary | ICD-10-CM | POA: Diagnosis not present

## 2020-04-14 NOTE — Telephone Encounter (Signed)
Please advise 

## 2020-04-14 NOTE — Telephone Encounter (Signed)
Patient  Has questions as to if she should get a shot for her Bulging disk and Arthritis that her spine doctors wants to give her. Or   If there is another treatment such as Steroids that she can take. Her question is to if she could take this and it be okay with being  Diabetic. She also wants to know  if she should take Celebrix vs. Tramdol to keep for having liver damage. Please Advise

## 2020-04-14 NOTE — Telephone Encounter (Signed)
Steroids will increase the glucose but can be controlled with insulin if needed--- so if the pain is severe and they would like to give injection --- she should get but she would need to check glucose qid and maybe come in for insulin----so we can show her how to use a sliding scale

## 2020-04-18 NOTE — Telephone Encounter (Signed)
Pt also wants to know about Celebrix vs. Tramdol to keep from having liver damage. Please advise

## 2020-04-18 NOTE — Telephone Encounter (Signed)
They effect the kidneys , no so much the liver

## 2020-04-19 NOTE — Telephone Encounter (Signed)
Attempted to call patient. VM was full 

## 2020-05-04 ENCOUNTER — Other Ambulatory Visit: Payer: Self-pay | Admitting: Physician Assistant

## 2020-05-09 ENCOUNTER — Other Ambulatory Visit: Payer: Self-pay | Admitting: Family Medicine

## 2020-05-09 ENCOUNTER — Other Ambulatory Visit: Payer: Self-pay | Admitting: Physician Assistant

## 2020-05-09 DIAGNOSIS — I10 Essential (primary) hypertension: Secondary | ICD-10-CM

## 2020-05-17 ENCOUNTER — Encounter: Payer: Self-pay | Admitting: Physician Assistant

## 2020-05-17 ENCOUNTER — Ambulatory Visit (INDEPENDENT_AMBULATORY_CARE_PROVIDER_SITE_OTHER): Payer: Medicare Other | Admitting: Physician Assistant

## 2020-05-17 ENCOUNTER — Other Ambulatory Visit: Payer: Self-pay

## 2020-05-17 DIAGNOSIS — E032 Hypothyroidism due to medicaments and other exogenous substances: Secondary | ICD-10-CM | POA: Diagnosis not present

## 2020-05-17 DIAGNOSIS — G2581 Restless legs syndrome: Secondary | ICD-10-CM | POA: Diagnosis not present

## 2020-05-17 DIAGNOSIS — F3181 Bipolar II disorder: Secondary | ICD-10-CM | POA: Diagnosis not present

## 2020-05-17 DIAGNOSIS — Z79899 Other long term (current) drug therapy: Secondary | ICD-10-CM

## 2020-05-17 DIAGNOSIS — F419 Anxiety disorder, unspecified: Secondary | ICD-10-CM | POA: Diagnosis not present

## 2020-05-17 MED ORDER — CLORAZEPATE DIPOTASSIUM 3.75 MG PO TABS: 3.7500 mg | ORAL_TABLET | Freq: Two times a day (BID) | ORAL | 0 refills | Status: DC | PRN

## 2020-05-17 MED ORDER — LITHIUM CARBONATE 300 MG PO CAPS: 900.0000 mg | ORAL_CAPSULE | Freq: Every evening | ORAL | 1 refills | Status: DC

## 2020-05-17 NOTE — Progress Notes (Signed)
[facility] Check  Patient ID: Megan Duncan,  MRN: [id number]  PCP: Ann Held, DO  Date of Evaluation: 05/17/2020 Time spent:30 minutes  Chief Complaint:  Chief Complaint    Anxiety; Depression      HISTORY/CURRENT STATUS: HPI For routine med check.  Doesn't have much energy. Also doesn't want to go anywhere.  Likes to stay in her bed, watching tv.  Not bathing or brushing her teeth like she should. Her back pain is still a huge problem so that doesn't help her mental health at all. Hard to get up her stairs.   Is frustrated b/c there doesn't seem to be any treatment for her back b/c of multiple herniated discs and spinal stenosis. She is back to playing bridge some, but it's difficult. Not crying easily. She asks if she should go to therapy, "it's been running through her mind."  Wonders if we should increase the Lithium or something. No SI/HI.  Patient denies increased energy with decreased need for sleep, no increased talkativeness, no racing thoughts, no impulsivity or risky behaviors, no increased spending, no increased libido, no grandiosity. No increased irritability. No hallucinations.  Anxiety is controlled for the most part. Does take the Tranxene and it helps. Also occasionally will have restlessness in her legs and the Tranxene helps.  Denies dizziness, syncope, seizures, numbness, tingling, tremor, tics, unsteady gait, slurred speech, confusion. Has chronic low back pain,  stiffness, or dystonia.  Individual Medical History/ Review of Systems: Changes? :Yes   Is having problems with her eyes. Will see Eye Dr. soon.   Past medications for mental health diagnoses include: Trileptal Lamictal Prozac Zoloft Effexor XR, Cymbalta, Paxil, lithium, Depakote, Latuda, Risperdal, Tegretol, Wellbutrin  Allergies: Atorvastatin, Codeine, Diclofenac, Doxycycline, and Sulfonamide derivatives  Current Medications:  Current Outpatient Medications:    albuterol  (VENTOLIN HFA) 108 (90 Base) MCG/ACT inhaler, Inhale 2 puffs into the lungs every 4 (four) hours as needed. For shortness of breath and wheezing, Disp: 18 g, Rfl: 3   amLODipine (NORVASC) 5 MG tablet, TAKE 1 TABLET BY MOUTH EVERY DAY, Disp: 90 tablet, Rfl: 1   blood glucose meter kit and supplies, Dispense based on patient and insurance preference. Use once a day.  DX Code: E11.9, Disp: 1 each, Rfl: 0   budesonide-formoterol (SYMBICORT) 80-4.5 MCG/ACT inhaler, Inhale 2 puffs into the lungs 2 (two) times daily., Disp: 1 Inhaler, Rfl: 0   clorazepate (TRANXENE) 3.75 MG tablet, Take 1 tablet (3.75 mg total) by mouth 2 (two) times daily as needed for anxiety., Disp: 180 tablet, Rfl: 0   DULoxetine (CYMBALTA) 20 MG capsule, TAKE 2 CAPSULES (40 MG TOTAL) BY MOUTH DAILY. TAKE W/ 60 MG=100 MG, Disp: 180 capsule, Rfl: 1   DULoxetine (CYMBALTA) 60 MG capsule, TAKE 1 CAPSULE BY MOUTH EVERY MORNING, Disp: 90 capsule, Rfl: 0   glucose blood test strip, Use as instructed once a day.  Dx Code: E11.9, Disp: 30 each, Rfl: 2   Lancets MISC, Use as directed once a day.  Dx code: E11.9, Disp: 30 each, Rfl: 2   levothyroxine (SYNTHROID) 25 MCG tablet, TAKE 1 TABLET (25 MCG TOTAL) BY MOUTH DAILY BEFORE BREAKFAST., Disp: 90 tablet, Rfl: 1   lithium carbonate 300 MG capsule, Take 3 capsules (900 mg total) by mouth at bedtime., Disp: 270 capsule, Rfl: 1   metFORMIN (GLUCOPHAGE) 500 MG tablet, TAKE 1 TABLET (500 MG TOTAL) BY MOUTH 2 (TWO) TIMES DAILY WITH A MEAL., Disp: 180 tablet, Rfl: 1  nystatin cream (MYCOSTATIN), Apply 1 application topically 2 (two) times daily., Disp: 30 g, Rfl: 0   propranolol (INDERAL) 10 MG tablet, TAKE 1 TABLET BY MOUTH TWICE A DAY, Disp: 180 tablet, Rfl: 1   vitamin B-12 (CYANOCOBALAMIN) 100 MCG tablet, Take 100 mcg by mouth daily., Disp: , Rfl:    cyclobenzaprine (FLEXERIL) 10 MG tablet, Take 1 tablet (10 mg total) by mouth 3 (three) times daily as needed. For muscle spasms  (Patient not taking: Reported on 05/17/2020), Disp: 30 tablet, Rfl: 0   hydrocortisone-pramoxine (ANALPRAM HC) 2.5-1 % rectal cream, Place 1 application rectally 3 (three) times daily. (Patient not taking: Reported on 05/17/2020), Disp: 30 g, Rfl: 3   omega-3 acid ethyl esters (LOVAZA) 1 g capsule, Take by mouth 2 (two) times daily. (Patient not taking: Reported on 05/17/2020), Disp: , Rfl:    tiZANidine (ZANAFLEX) 4 MG capsule, , Disp: , Rfl:    topiramate (TOPAMAX) 25 MG tablet, Take 1 tablet (25 mg total) by mouth 2 (two) times daily., Disp: 60 tablet, Rfl: 1   traMADol (ULTRAM) 50 MG tablet, 1-2 po q6h prn (Patient not taking: Reported on 05/17/2020), Disp: 60 tablet, Rfl: 1 Medication Side Effects: none  Family Medical/ Social History: Changes? No  MENTAL HEALTH EXAM:  There were no vitals taken for this visit.There is no height or weight on file to calculate BMI.  General Appearance: Casual, Neat, Well Groomed and Obese  Eye Contact:  Good  Speech:  Clear and Coherent and Normal Rate  Volume:  Normal  Mood:  Euthymic  Affect:  Appropriate  Thought Process:  Goal Directed and Descriptions of Associations: Intact  Orientation:  Full (Time, Place, and Person)  Thought Content: Logical   Suicidal Thoughts:  No  Homicidal Thoughts:  No  Memory:  WNL  Judgement:  Good  Insight:  Good  Psychomotor Activity:  Normal  Concentration:  Concentration: Good and Attention Span: Good  Recall:  Good  Fund of Knowledge: Good  Language: Good  Assets:  Desire for Improvement  ADL's:  Intact  Cognition: WNL  Prognosis:  Good     DIAGNOSES:    ICD-10-CM   1. Bipolar 2 disorder (HCC)  F31.81   2. 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 Anxiety  F41.9   4. Restless leg  G25.81   5. Encounter for long-term (current) use of medications  Z79.899 lithium carbonate 300 MG capsule    Lithium level    Receiving Psychotherapy: No    RECOMMENDATIONS:  PDMP reviewed. I provided 30  minutes of face-to-face care during this encounter.  We discussed different options to help with depression.  We could either increase the lithium or the Cymbalta.  She feels like the lithium is what has helped her most in the past so we agreed to increase that. Increase Lithium 300 mg to 3 po qhs. Continue Tranxene 3.75 mg 1 twice daily as needed. Continue Cymbalta 20 mg, 2 p.o. daily with 60 mg pills to equal 100 mg. Continue Cymbalta 60 mg, 1 p.o. daily with 40 mg to equal 100 mg. Continue Inderal 10 mg 1 twice daily. Continue fish oil, B complex and multivitamin. Labs ordered as above.  Return in 4 weeks.  Donnal Moat, PA-C

## 2020-05-20 ENCOUNTER — Telehealth: Payer: Self-pay | Admitting: Physician Assistant

## 2020-05-20 NOTE — Telephone Encounter (Signed)
As far as I know, there are no contraindications for her mental health medications and this new diagnosis.  She will need to ask her ophthalmologist if here she thinks there is a problem with any of them.

## 2020-05-20 NOTE — Telephone Encounter (Signed)
Discussed things with her and advised her to call us back with update. She's not seen a doctor yet she has decided by reading things on the Internet that's what she has. Has apt next with  ophthalmologist.

## 2020-05-20 NOTE — Telephone Encounter (Signed)
Pt called to make you aware that since her appt with you she has found out she has Macular Degeneration. She is concerned if this will interfere with her current med regimen. She is requesting a call back today because she has an appt next week concerning her recent medical issue.

## 2020-05-23 ENCOUNTER — Telehealth: Payer: Self-pay | Admitting: Physician Assistant

## 2020-05-23 NOTE — Telephone Encounter (Signed)
Pt called to report she is shaky inside since increase of Lithium from 600 mg to 900 mg daily. Also, pt back doctor gave her a Rx for Gabapentin 300 mg 7 days and increase to 2-300 mg daily. Would like to know your thoughts on this med with all she takes and side effects. Please advise [phone]

## 2020-05-23 NOTE — Telephone Encounter (Signed)
Can she do the lithium 300 mg, 2.5 pills?  So that would be 750 mg.  If she can do that and not having the shaking that would be best. I think gabapentin is an excellent choice.  It helps with anxiety, and I prescribed a lot.

## 2020-05-24 ENCOUNTER — Other Ambulatory Visit: Payer: Self-pay | Admitting: Physician Assistant

## 2020-05-24 MED ORDER — LITHIUM CARBONATE 150 MG PO CAPS: 150.0000 mg | ORAL_CAPSULE | Freq: Every day | ORAL | 1 refills | Status: DC

## 2020-05-24 NOTE — Telephone Encounter (Signed)
Rx sent in

## 2020-05-25 ENCOUNTER — Telehealth (INDEPENDENT_AMBULATORY_CARE_PROVIDER_SITE_OTHER): Payer: Medicare Other | Admitting: Family Medicine

## 2020-05-25 ENCOUNTER — Other Ambulatory Visit: Payer: Self-pay

## 2020-05-25 ENCOUNTER — Encounter: Payer: Self-pay | Admitting: Family Medicine

## 2020-05-25 VITALS — Ht 62.25 in | Wt 189.2 lb

## 2020-05-25 DIAGNOSIS — W19XXXA Unspecified fall, initial encounter: Secondary | ICD-10-CM | POA: Insufficient documentation

## 2020-05-25 DIAGNOSIS — M5416 Radiculopathy, lumbar region: Secondary | ICD-10-CM

## 2020-05-25 NOTE — Assessment & Plan Note (Signed)
Pt states it was dark and she should have had a light on Some bruising left only  F/u prn

## 2020-05-25 NOTE — Progress Notes (Signed)
Virtual Visit via Video Note  I connected with Megan Duncan on 05/25/20 at 11:00 AM EDT by a video enabled telemedicine application and verified that I am speaking with the correct person using two identifiers.  Location: Patient: home alone  Provider: office    I discussed the limitations of evaluation and management by telemedicine and the availability of in person appointments. The patient expressed understanding and agreed to proceed.  History of Present Illness:  Pt is home c/o back pain and is [facility] -- the would like to give her a steroid injection but they are concerned about her blood sugers.   Pt fell yesterday in bathroom at night ---- she has bruising on foot and elbow and arms/ legs -- no loc.  She did not hit her head .  No headache, dizziness, nauseous etc.   Observations/Objective: There were no vitals filed for this visit. Pt is in NAD   Assessment and Plan: 1. Lumbar back pain with radiculopathy affecting left lower extremity Per [facility] ---  Pt will try gabepentin and if it does not work will consider shot Pt instructed to see [name] first so we can instruct her on sliding scale insulin to use if needed    2. Fall, initial encounter Pt states it was dark and she should have had a light on Some bruising left only  F/u prn    Follow Up Instructions:    I discussed the assessment and treatment plan with the patient. The patient was provided an opportunity to ask questions and all were answered. The patient agreed with the plan and demonstrated an understanding of the instructions.   The patient was advised to call back or seek an in-person evaluation if the symptoms worsen or if the condition fails to improve as anticipated.  I provided 30 minutes of non-face-to-face time during this encounter.   Ann Held, DO

## 2020-05-25 NOTE — Assessment & Plan Note (Signed)
[facility] ---  Pt will try gabepentin and if it does not work will consider shot Pt instructed to see [name] first so we can instruct her on sliding scale insulin to use if needed

## 2020-05-27 DIAGNOSIS — E119 Type 2 diabetes mellitus without complications: Secondary | ICD-10-CM | POA: Diagnosis not present

## 2020-05-27 DIAGNOSIS — H35342 Macular cyst, hole, or pseudohole, left eye: Secondary | ICD-10-CM | POA: Diagnosis not present

## 2020-05-27 DIAGNOSIS — H2513 Age-related nuclear cataract, bilateral: Secondary | ICD-10-CM | POA: Diagnosis not present

## 2020-06-06 DIAGNOSIS — H35342 Macular cyst, hole, or pseudohole, left eye: Secondary | ICD-10-CM | POA: Diagnosis not present

## 2020-06-07 DIAGNOSIS — H35342 Macular cyst, hole, or pseudohole, left eye: Secondary | ICD-10-CM | POA: Diagnosis not present

## 2020-06-15 DIAGNOSIS — H35342 Macular cyst, hole, or pseudohole, left eye: Secondary | ICD-10-CM | POA: Diagnosis not present

## 2020-06-16 ENCOUNTER — Encounter: Payer: Self-pay | Admitting: Physician Assistant

## 2020-06-16 ENCOUNTER — Telehealth: Payer: Self-pay | Admitting: Physician Assistant

## 2020-06-16 ENCOUNTER — Telehealth (INDEPENDENT_AMBULATORY_CARE_PROVIDER_SITE_OTHER): Payer: Medicare Other | Admitting: Physician Assistant

## 2020-06-16 DIAGNOSIS — F419 Anxiety disorder, unspecified: Secondary | ICD-10-CM | POA: Diagnosis not present

## 2020-06-16 DIAGNOSIS — F3181 Bipolar II disorder: Secondary | ICD-10-CM

## 2020-06-16 DIAGNOSIS — M545 Low back pain: Secondary | ICD-10-CM | POA: Diagnosis not present

## 2020-06-16 DIAGNOSIS — G2581 Restless legs syndrome: Secondary | ICD-10-CM | POA: Diagnosis not present

## 2020-06-16 DIAGNOSIS — M5136 Other intervertebral disc degeneration, lumbar region: Secondary | ICD-10-CM | POA: Diagnosis not present

## 2020-06-16 DIAGNOSIS — M47816 Spondylosis without myelopathy or radiculopathy, lumbar region: Secondary | ICD-10-CM | POA: Diagnosis not present

## 2020-06-16 NOTE — Telephone Encounter (Signed)
[name], Megan Duncan are scheduled for a virtual visit with your provider today.    Just as we do with appointments in the office, we must obtain your consent to participate.  Your consent will be active for this visit and any virtual visit you may have with one of our providers in the next 365 days.    If you have a MyChart account, I can also send a copy of this consent to you electronically.  All virtual visits are billed to your insurance company just like a traditional visit in the office.  As this is a virtual visit, video technology does not allow for your provider to perform a traditional examination.  This may limit your provider's ability to fully assess your condition.  If your provider identifies any concerns that need to be evaluated in person or the need to arrange testing such as labs, EKG, etc, we will make arrangements to do so.    Although advances in technology are sophisticated, we cannot ensure that it will always work on either your end or our end.  If the connection with a video visit is poor, we may have to switch to a telephone visit.  With either a video or telephone visit, we are not always able to ensure that we have a secure connection.   I need to obtain your verbal consent now.   Are you willing to proceed with your visit today?   Megan Duncan has provided verbal consent on 06/16/2020 for a virtual visit (video or telephone).   Donnal Moat, PA-C 06/16/2020  11:09 AM

## 2020-06-16 NOTE — Progress Notes (Signed)
[facility]  Patient ID: Megan Duncan,  MRN: [id number]  PCP: Ann Held, DO  Date of Evaluation: 06/16/2020 Time spent:20 minutes  Chief Complaint:  Chief Complaint    Follow-up     Virtual Visit via Telehealth  I connected with patient by a video enabled telemedicine application with their informed consent, and verified patient privacy and that I am speaking with the correct person using two identifiers.  I am private, in my office and the patient is at home.  I discussed the limitations, risks, security and privacy concerns of performing an evaluation and management service by video and the availability of in person appointments. I also discussed with the patient that there may be a patient responsible charge related to this service. The patient expressed understanding and agreed to proceed.   I discussed the assessment and treatment plan with the patient. The patient was provided an opportunity to ask questions and all were answered. The patient agreed with the plan and demonstrated an understanding of the instructions.   The patient was advised to call back or seek an in-person evaluation if the symptoms worsen or if the condition fails to improve as anticipated.  I provided 30 minutes of non-face-to-face time during this encounter.  HISTORY/CURRENT STATUS: HPI For routine med check.  We changed the Lithium to 769m total. Feels like this dose is good. She is very limited to do things b/c of her back, but is in good spirits. She does go out to eat sometimes, and to play bridge, but that's about it. Isn't able to go to the mall or things like that. But her motivation and energy are good. No SI/HI.  Patient denies increased energy with decreased need for sleep, no increased talkativeness, no racing thoughts, no impulsivity or risky behaviors, no increased spending, no increased libido, no grandiosity. No increased irritability. No hallucinations, no  paranoia.  Anxiety is controlled for the most part. Does take the Tranxene and it helps. Also occasionally will have restlessness in her legs and the Tranxene helps.  Denies dizziness, syncope, seizures, numbness, tingling, tremor, tics, unsteady gait, slurred speech, confusion. Has chronic low back pain,  stiffness, or dystonia.  Individual Medical History/ Review of Systems: Changes? :Yes   Had hole in macula, healing well.   Past medications for mental health diagnoses include: Trileptal Lamictal Prozac Zoloft Effexor XR, Cymbalta, Paxil, lithium, Depakote, Latuda, Risperdal, Tegretol, Wellbutrin  Allergies: Atorvastatin, Codeine, Diclofenac, Doxycycline, and Sulfonamide derivatives  Current Medications:  Current Outpatient Medications:  .  albuterol (VENTOLIN HFA) 108 (90 Base) MCG/ACT inhaler, Inhale 2 puffs into the lungs every 4 (four) hours as needed. For shortness of breath and wheezing, Disp: 18 g, Rfl: 3 .  amLODipine (NORVASC) 5 MG tablet, TAKE 1 TABLET BY MOUTH EVERY DAY, Disp: 90 tablet, Rfl: 1 .  blood glucose meter kit and supplies, Dispense based on patient and insurance preference. Use once a day.  DX Code: E11.9, Disp: 1 each, Rfl: 0 .  budesonide-formoterol (SYMBICORT) 80-4.5 MCG/ACT inhaler, Inhale 2 puffs into the lungs 2 (two) times daily., Disp: 1 Inhaler, Rfl: 0 .  clorazepate (TRANXENE) 3.75 MG tablet, Take 1 tablet (3.75 mg total) by mouth 2 (two) times daily as needed for anxiety., Disp: 180 tablet, Rfl: 0 .  cyclobenzaprine (FLEXERIL) 10 MG tablet, Take 1 tablet (10 mg total) by mouth 3 (three) times daily as needed. For muscle spasms, Disp: 30 tablet, Rfl: 0 .  DULoxetine (CYMBALTA) 20 MG capsule,  TAKE 2 CAPSULES (40 MG TOTAL) BY MOUTH DAILY. TAKE W/ 60 MG=100 MG, Disp: 180 capsule, Rfl: 1 .  DULoxetine (CYMBALTA) 60 MG capsule, TAKE 1 CAPSULE BY MOUTH EVERY MORNING, Disp: 90 capsule, Rfl: 0 .  gabapentin (NEURONTIN) 300 MG capsule, Take by mouth., Disp: , Rfl:   .  glucose blood test strip, Use as instructed once a day.  Dx Code: E11.9, Disp: 30 each, Rfl: 2 .  hydrocortisone-pramoxine (ANALPRAM HC) 2.5-1 % rectal cream, Place 1 application rectally 3 (three) times daily., Disp: 30 g, Rfl: 3 .  Lancets MISC, Use as directed once a day.  Dx code: E11.9, Disp: 30 each, Rfl: 2 .  levothyroxine (SYNTHROID) 25 MCG tablet, TAKE 1 TABLET (25 MCG TOTAL) BY MOUTH DAILY BEFORE BREAKFAST., Disp: 90 tablet, Rfl: 1 .  lithium carbonate 150 MG capsule, Take 1 capsule (150 mg total) by mouth daily. Take 1 w/ Lithium 600 mg already taking=750 mg, Disp: 30 capsule, Rfl: 1 .  lithium carbonate 300 MG capsule, Take 3 capsules (900 mg total) by mouth at bedtime. (Patient taking differently: Take 600 mg by mouth at bedtime. ), Disp: 270 capsule, Rfl: 1 .  metFORMIN (GLUCOPHAGE) 500 MG tablet, TAKE 1 TABLET (500 MG TOTAL) BY MOUTH 2 (TWO) TIMES DAILY WITH A MEAL., Disp: 180 tablet, Rfl: 1 .  nystatin cream (MYCOSTATIN), Apply 1 application topically 2 (two) times daily., Disp: 30 g, Rfl: 0 .  propranolol (INDERAL) 10 MG tablet, TAKE 1 TABLET BY MOUTH TWICE A DAY, Disp: 180 tablet, Rfl: 1 .  vitamin B-12 (CYANOCOBALAMIN) 100 MCG tablet, Take 100 mcg by mouth daily., Disp: , Rfl:  .  omega-3 acid ethyl esters (LOVAZA) 1 g capsule, Take by mouth 2 (two) times daily.  (Patient not taking: Reported on 06/16/2020), Disp: , Rfl:  .  tiZANidine (ZANAFLEX) 4 MG capsule, , Disp: , Rfl:  .  topiramate (TOPAMAX) 25 MG tablet, Take 1 tablet (25 mg total) by mouth 2 (two) times daily. (Patient not taking: Reported on 06/16/2020), Disp: 60 tablet, Rfl: 1 .  traMADol (ULTRAM) 50 MG tablet, 1-2 po q6h prn (Patient not taking: Reported on 06/16/2020), Disp: 60 tablet, Rfl: 1 Medication Side Effects: none  Family Medical/ Social History: Changes? No  MENTAL HEALTH EXAM:  There were no vitals taken for this visit.There is no height or weight on file to calculate BMI.  General Appearance: unable to  assess  Eye Contact:  unable to assess  Speech:  Clear and Coherent and Normal Rate  Volume:  Normal  Mood:  Euthymic  Affect:  Appropriate  Thought Process:  Goal Directed and Descriptions of Associations: Intact  Orientation:  Full (Time, Place, and Person)  Thought Content: Logical   Suicidal Thoughts:  No  Homicidal Thoughts:  No  Memory:  WNL  Judgement:  Good  Insight:  Good  Psychomotor Activity:  unable to assess  Concentration:  Concentration: Good and Attention Span: Good  Recall:  Good  Fund of Knowledge: Good  Language: Good  Assets:  Desire for Improvement  ADL's:  Intact  Cognition: WNL  Prognosis:  Good     DIAGNOSES:    ICD-10-CM   1. Bipolar 2 disorder (HCC)  F31.81   2. Anxiety  F41.9   3. Restless leg  G25.81     Receiving Psychotherapy: No    RECOMMENDATIONS:  PDMP reviewed. I provided 20 minutes of non face-to-face care during this encounter.  I'm glad she's feeling well mentally and  I hope she recovers quickly from the macular surgery. Continue Li 750 mg qd.  Continue Tranxene 3.75 mg 1 twice daily as needed. Continue Cymbalta 20 mg, 2 p.o. daily with 60 mg pills to equal 100 mg. Continue Cymbalta 60 mg, 1 p.o. daily with 40 mg to equal 100 mg. Continue Inderal 10 mg 1 twice daily. Continue fish oil, B complex and multivitamin. Get Lithium level drawn. Return in 3 months.   Donnal Moat, PA-C

## 2020-06-20 ENCOUNTER — Other Ambulatory Visit: Payer: Self-pay | Admitting: Family Medicine

## 2020-06-20 ENCOUNTER — Other Ambulatory Visit: Payer: Self-pay | Admitting: Physician Assistant

## 2020-06-20 ENCOUNTER — Other Ambulatory Visit: Payer: Self-pay

## 2020-06-20 MED ORDER — GLUCOSE BLOOD VI STRP: ORAL_STRIP | 2 refills | Status: DC

## 2020-06-21 NOTE — Telephone Encounter (Signed)
Please review

## 2020-06-23 ENCOUNTER — Other Ambulatory Visit: Payer: Self-pay | Admitting: Orthopaedic Surgery

## 2020-06-23 ENCOUNTER — Other Ambulatory Visit: Payer: Self-pay | Admitting: Family Medicine

## 2020-06-23 DIAGNOSIS — M47816 Spondylosis without myelopathy or radiculopathy, lumbar region: Secondary | ICD-10-CM

## 2020-07-03 ENCOUNTER — Other Ambulatory Visit: Payer: Self-pay | Admitting: Physician Assistant

## 2020-07-05 DIAGNOSIS — H35342 Macular cyst, hole, or pseudohole, left eye: Secondary | ICD-10-CM | POA: Diagnosis not present

## 2020-07-05 DIAGNOSIS — H43811 Vitreous degeneration, right eye: Secondary | ICD-10-CM | POA: Diagnosis not present

## 2020-07-12 ENCOUNTER — Telehealth: Payer: Self-pay | Admitting: Physician Assistant

## 2020-07-12 ENCOUNTER — Other Ambulatory Visit: Payer: Self-pay

## 2020-07-12 DIAGNOSIS — Z79899 Other long term (current) drug therapy: Secondary | ICD-10-CM

## 2020-07-12 MED ORDER — LITHIUM CARBONATE 150 MG PO CAPS: ORAL_CAPSULE | ORAL | 1 refills | Status: DC

## 2020-07-12 MED ORDER — LITHIUM CARBONATE 300 MG PO CAPS: 600.0000 mg | ORAL_CAPSULE | Freq: Every evening | ORAL | 1 refills | Status: DC

## 2020-07-12 NOTE — Telephone Encounter (Signed)
Both Rx's updated, patient is correct 2 of the 300 mg Lithium and 1 of the 150 mg lithium to equal 750 mg daily. She did have a decrease and it was probably the old Rx

## 2020-07-12 NOTE — Telephone Encounter (Signed)
Megan Duncan called because she needs a refill of her lithium 150mg .  [facility] said she had to call to request it.  Also, she noted that on her 300mg  bottle it says 3/day, but she is only suppose to take 2/day and that is what she has been doing for a total of 750mg /day. Should that prescription be corrected for any refills when they come due?  [facility]

## 2020-07-19 ENCOUNTER — Other Ambulatory Visit: Payer: Medicare Other

## 2020-08-11 ENCOUNTER — Ambulatory Visit
Admission: RE | Admit: 2020-08-11 | Discharge: 2020-08-11 | Disposition: A | Discharge status: Home / Self Care | Payer: Medicare Other | Source: Ambulatory Visit | Attending: Orthopaedic Surgery | Admitting: Orthopaedic Surgery

## 2020-08-11 ENCOUNTER — Other Ambulatory Visit: Payer: Self-pay

## 2020-08-11 DIAGNOSIS — M48061 Spinal stenosis, lumbar region without neurogenic claudication: Secondary | ICD-10-CM | POA: Diagnosis not present

## 2020-08-11 DIAGNOSIS — M47816 Spondylosis without myelopathy or radiculopathy, lumbar region: Secondary | ICD-10-CM

## 2020-08-16 DIAGNOSIS — M48062 Spinal stenosis, lumbar region with neurogenic claudication: Secondary | ICD-10-CM | POA: Diagnosis not present

## 2020-08-16 DIAGNOSIS — M4726 Other spondylosis with radiculopathy, lumbar region: Secondary | ICD-10-CM | POA: Diagnosis not present

## 2020-08-16 DIAGNOSIS — M4316 Spondylolisthesis, lumbar region: Secondary | ICD-10-CM | POA: Diagnosis not present

## 2020-08-22 DIAGNOSIS — M5136 Other intervertebral disc degeneration, lumbar region: Secondary | ICD-10-CM | POA: Diagnosis not present

## 2020-08-22 DIAGNOSIS — G8929 Other chronic pain: Secondary | ICD-10-CM | POA: Diagnosis not present

## 2020-08-22 DIAGNOSIS — M51369 Other intervertebral disc degeneration, lumbar region without mention of lumbar back pain or lower extremity pain: Secondary | ICD-10-CM

## 2020-08-22 DIAGNOSIS — M48061 Spinal stenosis, lumbar region without neurogenic claudication: Secondary | ICD-10-CM | POA: Insufficient documentation

## 2020-08-22 DIAGNOSIS — M4316 Spondylolisthesis, lumbar region: Secondary | ICD-10-CM | POA: Diagnosis not present

## 2020-08-22 DIAGNOSIS — M4310 Spondylolisthesis, site unspecified: Secondary | ICD-10-CM | POA: Diagnosis not present

## 2020-08-22 DIAGNOSIS — M545 Low back pain, unspecified: Secondary | ICD-10-CM | POA: Diagnosis not present

## 2020-08-22 DIAGNOSIS — M47816 Spondylosis without myelopathy or radiculopathy, lumbar region: Secondary | ICD-10-CM | POA: Diagnosis not present

## 2020-08-22 HISTORY — DX: Spinal stenosis, lumbar region without neurogenic claudication: M48.061

## 2020-08-22 HISTORY — DX: Other intervertebral disc degeneration, lumbar region: M51.36

## 2020-08-22 HISTORY — DX: Spondylolisthesis, site unspecified: M43.10

## 2020-08-22 HISTORY — DX: Other intervertebral disc degeneration, lumbar region without mention of lumbar back pain or lower extremity pain: M51.369

## 2020-09-13 DIAGNOSIS — M47816 Spondylosis without myelopathy or radiculopathy, lumbar region: Secondary | ICD-10-CM | POA: Diagnosis not present

## 2020-09-15 ENCOUNTER — Ambulatory Visit: Payer: Medicare Other | Admitting: Physician Assistant

## 2020-09-18 ENCOUNTER — Other Ambulatory Visit: Payer: Self-pay | Admitting: Family Medicine

## 2020-09-21 DIAGNOSIS — Z23 Encounter for immunization: Secondary | ICD-10-CM | POA: Diagnosis not present

## 2020-09-22 ENCOUNTER — Other Ambulatory Visit: Payer: Self-pay

## 2020-09-22 ENCOUNTER — Encounter: Payer: Self-pay | Admitting: Family Medicine

## 2020-09-22 ENCOUNTER — Ambulatory Visit (INDEPENDENT_AMBULATORY_CARE_PROVIDER_SITE_OTHER): Payer: Medicare Other | Admitting: Family Medicine

## 2020-09-22 VITALS — BP 118/70 | HR 75 | Temp 98.5°F | Resp 18 | Ht 62.25 in | Wt 197.0 lb

## 2020-09-22 DIAGNOSIS — E785 Hyperlipidemia, unspecified: Secondary | ICD-10-CM | POA: Diagnosis not present

## 2020-09-22 DIAGNOSIS — F319 Bipolar disorder, unspecified: Secondary | ICD-10-CM

## 2020-09-22 DIAGNOSIS — E1165 Type 2 diabetes mellitus with hyperglycemia: Secondary | ICD-10-CM

## 2020-09-22 DIAGNOSIS — E1169 Type 2 diabetes mellitus with other specified complication: Secondary | ICD-10-CM

## 2020-09-22 DIAGNOSIS — E782 Mixed hyperlipidemia: Secondary | ICD-10-CM

## 2020-09-22 DIAGNOSIS — E1151 Type 2 diabetes mellitus with diabetic peripheral angiopathy without gangrene: Secondary | ICD-10-CM | POA: Diagnosis not present

## 2020-09-22 DIAGNOSIS — IMO0002 Reserved for concepts with insufficient information to code with codable children: Secondary | ICD-10-CM

## 2020-09-22 DIAGNOSIS — E039 Hypothyroidism, unspecified: Secondary | ICD-10-CM | POA: Diagnosis not present

## 2020-09-22 DIAGNOSIS — I10 Essential (primary) hypertension: Secondary | ICD-10-CM | POA: Diagnosis not present

## 2020-09-22 LAB — COMPREHENSIVE METABOLIC PANEL
ALT: 12 U/L (ref 0–35)
AST: 16 U/L (ref 0–37)
Albumin: 4.5 g/dL (ref 3.5–5.2)
Alkaline Phosphatase: 52 U/L (ref 39–117)
BUN: 14 mg/dL (ref 6–23)
CO2: 26 mEq/L (ref 19–32)
Calcium: 9.6 mg/dL (ref 8.4–10.5)
Chloride: 103 mEq/L (ref 96–112)
Creatinine, Ser: 0.8 mg/dL (ref 0.40–1.20)
GFR: 72.6 mL/min (ref >60.00)
Glucose, Bld: 115 mg/dL — ABNORMAL HIGH (ref 70–99)
Potassium: 4.5 mEq/L (ref 3.5–5.1)
Sodium: 139 mEq/L (ref 135–145)
Total Bilirubin: 0.6 mg/dL (ref 0.2–1.2)
Total Protein: 7 g/dL (ref 6.0–8.3)

## 2020-09-22 LAB — LIPID PANEL
Cholesterol: 201 mg/dL — ABNORMAL HIGH (ref 0–200)
HDL: 72.6 mg/dL (ref >39.00)
LDL Cholesterol: 99 mg/dL (ref 0–99)
NonHDL: 128.7
Total CHOL/HDL Ratio: 3
Triglycerides: 151 mg/dL — ABNORMAL HIGH (ref 0.0–149.0)
VLDL: 30.2 mg/dL (ref 0.0–40.0)

## 2020-09-22 LAB — MICROALBUMIN / CREATININE URINE RATIO
Creatinine,U: 142.5 mg/dL
Microalb Creat Ratio: 1.9 mg/g (ref 0.0–30.0)
Microalb, Ur: 2.7 mg/dL — ABNORMAL HIGH (ref 0.0–1.9)

## 2020-09-22 LAB — HEMOGLOBIN A1C: Hgb A1c MFr Bld: 6.1 % (ref 4.6–6.5)

## 2020-09-22 LAB — TSH: TSH: 5.46 u[IU]/mL — ABNORMAL HIGH (ref 0.35–4.50)

## 2020-09-22 NOTE — Progress Notes (Signed)
Patient ID: Megan Duncan, female    DOB: 16-Jun-1946  Age: 74 y.o. MRN: [id number]    Subjective:  Subjective  HPI Megan Duncan presents for f/u dm , chol and bp  She is having back problems --- but is seeing Dr Eugenio Hoes and scoliosis and spine and she sees Dr Vertell Limber.  She will get a medial branch block.    HYPERTENSION   Blood pressure range-not checking   Chest pain- no      Dyspnea- no Lightheadedness- no   Edema- no  Other side effects - no   Medication compliance: good Low salt diet- yes     DIABETES    Blood Sugar ranges-184 highest since last visit   Polyuria- no New Visual problems- no  Hypoglycemic symptoms- no  Other side effects-no Medication compliance - good Last eye exam- recent  Foot exam- today   HYPERLIPIDEMIA  Medication compliance- good RUQ pain- no  Muscle aches- no Other side effects-no      Review of Systems  Constitutional: Negative for appetite change, diaphoresis, fatigue and unexpected weight change.  Eyes: Negative for pain, redness and visual disturbance.  Respiratory: Negative for cough, chest tightness, shortness of breath and wheezing.   Cardiovascular: Negative for chest pain, palpitations and leg swelling.  Endocrine: Negative for cold intolerance, heat intolerance, polydipsia, polyphagia and polyuria.  Genitourinary: Negative for difficulty urinating, dysuria and frequency.  Neurological: Negative for dizziness, light-headedness, numbness and headaches.    History Past Medical History:  Diagnosis Date  . Anxiety   . Arthritis    hands  . Bipolar II disorder ([place])    Dr. Charlott Holler  . COPD (chronic obstructive pulmonary disease) ([place])   . Depression   . Diastolic dysfunction    Per pt, diagnosed after [facility]  . Diverticulosis   . Fibromyalgia   . Hyperlipidemia   . Hypertension   . LBP (low back pain)     She has a past surgical history that includes Appendectomy; Total abdominal hysterectomy; Tonsillectomy; and Knee  arthroscopy (Right, 2008).   Her family history includes Alcoholism in her father; Breast cancer in her maternal aunt; Colon cancer (age of onset: 44) in her maternal aunt; Diabetes in her father; Heart attack in her father; Hiatal hernia in her mother; Hypertension in her father and mother; Mental illness in her sister.She reports that she quit smoking about 16 years ago. She has a 60.00 pack-year smoking history. She has never used smokeless tobacco. She reports current alcohol use. She reports that she does not use drugs.  Current Outpatient Medications on File Prior to Visit  Medication Sig Dispense Refill  . albuterol (VENTOLIN HFA) 108 (90 Base) MCG/ACT inhaler Inhale 2 puffs into the lungs every 4 (four) hours as needed. For shortness of breath and wheezing 18 g 3  . amLODipine (NORVASC) 5 MG tablet TAKE 1 TABLET BY MOUTH EVERY DAY 90 tablet 1  . blood glucose meter kit and supplies Dispense based on patient and insurance preference. Use once a day.  DX Code: E11.9 1 each 0  . budesonide-formoterol (SYMBICORT) 80-4.5 MCG/ACT inhaler Inhale 2 puffs into the lungs 2 (two) times daily. 1 Inhaler 0  . clorazepate (TRANXENE) 3.75 MG tablet Take 1 tablet (3.75 mg total) by mouth 2 (two) times daily as needed for anxiety. 180 tablet 0  . cyclobenzaprine (FLEXERIL) 10 MG tablet Take 1 tablet (10 mg total) by mouth 3 (three) times daily as needed. For muscle spasms 30 tablet 0  .  DULoxetine (CYMBALTA) 20 MG capsule TAKE 2 CAPSULES (40 MG TOTAL) BY MOUTH DAILY. TAKE W/ 60 MG=100 MG 180 capsule 1  . DULoxetine (CYMBALTA) 60 MG capsule TAKE 1 CAPSULE BY MOUTH EVERY MORNING 90 capsule 0  . gabapentin (NEURONTIN) 300 MG capsule Take by mouth.    Marland Kitchen glucose blood test strip Use as instructed once a day.  Dx Code: E11.9 30 each 2  . hydrocortisone-pramoxine (ANALPRAM HC) 2.5-1 % rectal cream Place 1 application rectally 3 (three) times daily. 30 g 3  . Lancets MISC Use as directed once a day.  Dx code: E11.9  30 each 2  . levothyroxine (SYNTHROID) 25 MCG tablet TAKE 1 TABLET (25 MCG TOTAL) BY MOUTH DAILY BEFORE BREAKFAST. 90 tablet 1  . lithium carbonate 150 MG capsule TAKE 1 CAPSULE (150 MG TOTAL) BY MOUTH DAILY, TAKE  WITH LITHIUM 600 MG  TO EQUAL 750 MG TOTAL 90 capsule 1  . lithium carbonate 300 MG capsule Take 2 capsules (600 mg total) by mouth at bedtime. 180 capsule 1  . metFORMIN (GLUCOPHAGE) 500 MG tablet TAKE 1 TABLET (500 MG TOTAL) BY MOUTH 2 (TWO) TIMES DAILY WITH A MEAL. 180 tablet 0  . nystatin cream (MYCOSTATIN) Apply 1 application topically 2 (two) times daily. 30 g 0  . omega-3 acid ethyl esters (LOVAZA) 1 g capsule Take by mouth 2 (two) times daily.     . propranolol (INDERAL) 10 MG tablet TAKE 1 TABLET BY MOUTH TWICE A DAY 180 tablet 1  . tiZANidine (ZANAFLEX) 4 MG capsule     . topiramate (TOPAMAX) 25 MG tablet Take 1 tablet (25 mg total) by mouth 2 (two) times daily. 60 tablet 1  . traMADol (ULTRAM) 50 MG tablet 1-2 po q6h prn 60 tablet 1  . vitamin B-12 (CYANOCOBALAMIN) 100 MCG tablet Take 100 mcg by mouth daily.     No current facility-administered medications on file prior to visit.     Objective:  Objective  Physical Exam Vitals and nursing note reviewed.  Constitutional:      Appearance: She is well-developed.  HENT:     Head: Normocephalic and atraumatic.  Eyes:     Conjunctiva/sclera: Conjunctivae normal.  Neck:     Thyroid: No thyromegaly.     Vascular: No carotid bruit or JVD.  Cardiovascular:     Rate and Rhythm: Normal rate and regular rhythm.     Heart sounds: Normal heart sounds. No murmur heard.   Pulmonary:     Effort: Pulmonary effort is normal. No respiratory distress.     Breath sounds: Normal breath sounds. No wheezing or rales.  Chest:     Chest wall: No tenderness.  Musculoskeletal:     Cervical back: Normal range of motion and neck supple.  Neurological:     Mental Status: She is alert and oriented to person, place, and time.     Diabetic Foot Exam - Simple   Simple Foot Form Diabetic Foot exam was performed with the following findings: Yes 09/22/2020  1:24 PM  Visual Inspection No deformities, no ulcerations, no other skin breakdown bilaterally: Yes Sensation Testing Intact to touch and monofilament testing bilaterally: Yes Pulse Check Posterior Tibialis and Dorsalis pulse intact bilaterally: Yes Comments     BP 118/70 (BP Location: Right Arm, Patient Position: Sitting, Cuff Size: Large)   Pulse 75   Temp 98.5 F (36.9 C) (Oral)   Resp 18   Ht 5' 2.25" (1.581 m)   Wt 197 lb (89.4  kg)   SpO2 93%   BMI 35.74 kg/m  Wt Readings from Last 3 Encounters:  09/22/20 197 lb (89.4 kg)  05/25/20 189 lb 3.2 oz (85.8 kg)  03/21/20 192 lb 6.4 oz (87.3 kg)     Lab Results  Component Value Date   WBC 7.7 08/30/2017   HGB 15.1 08/30/2017   HCT 45.4 (H) 08/30/2017   PLT 393 08/30/2017   GLUCOSE 154 (H) 03/21/2020   CHOL 218 (H) 03/21/2020   TRIG 193.0 (H) 03/21/2020   HDL 59.90 03/21/2020   LDLDIRECT 120.1 12/03/2013   LDLCALC 120 (H) 03/21/2020   ALT 13 03/21/2020   AST 16 03/21/2020   NA 140 03/21/2020   K 4.4 03/21/2020   CL 104 03/21/2020   CREATININE 0.82 03/21/2020   BUN 13 03/21/2020   CO2 27 03/21/2020   TSH 3.64 03/21/2020   INR 1.0 10/25/2015   HGBA1C 5.8 03/21/2020   MICROALBUR 2.2 (H) 01/08/2020    MR LUMBAR SPINE WO CONTRAST  Result Date: 08/11/2020 CLINICAL DATA:  Back pain and leg weakness EXAM: MRI LUMBAR SPINE WITHOUT CONTRAST TECHNIQUE: Multiplanar, multisequence MR imaging of the lumbar spine was performed. No intravenous contrast was administered. COMPARISON:  07/24/2019 FINDINGS: Segmentation:  Standard. Alignment: Grade 1 retrolisthesis at L3-4 and grade 1 anterolisthesis at L4-5, unchanged. Vertebrae:  No fracture, evidence of discitis, or bone lesion. Conus medullaris and cauda equina: Conus extends to the L1 level. Conus and cauda equina appear normal. Paraspinal and  other soft tissues: 3.6 cm right renal cyst is unchanged Disc levels: L1-L2: Small disc bulge. There is no spinal canal stenosis. No neural foraminal stenosis. L2-L3: Unchanged small disc bulge. There is no spinal canal stenosis. No neural foraminal stenosis. L3-L4: Unchanged small disc bulge and mild facet hypertrophy. There is no spinal canal stenosis. No neural foraminal stenosis. L4-L5: Unchanged intermediate sized disc bulge with severe facet hypertrophy. Moderate spinal canal stenosis. Mild bilateral neural foraminal stenosis. L5-S1: Small disc bulge and moderate facet hypertrophy, unchanged. There is no spinal canal stenosis. No neural foraminal stenosis. Visualized sacrum: Normal. IMPRESSION: 1. Unchanged examination with moderate L4-5 spinal canal stenosis and mild bilateral neural foraminal stenosis. 2. Unchanged grade 1 retrolisthesis at L3-4 and grade 1 anterolisthesis at L4-5. Electronically Signed   By: Ulyses Jarred M.D.   On: 08/11/2020 22:33     Assessment & Plan:  Plan  I am having Megan Duncan "Megan Duncan" maintain her hydrocortisone-pramoxine, nystatin cream, blood glucose meter kit and supplies, albuterol, budesonide-formoterol, topiramate, tiZANidine, traMADol, cyclobenzaprine, vitamin B-12, omega-3 acid ethyl esters, Lancets, propranolol, levothyroxine, amLODipine, DULoxetine, clorazepate, gabapentin, glucose blood, DULoxetine, lithium carbonate, lithium carbonate, and metFORMIN.  No orders of the defined types were placed in this encounter.   Problem List Items Addressed This Visit      Unprioritized   DM (diabetes mellitus) type II uncontrolled, periph vascular disorder ([place])    hgba1c to be checked  minimize simple carbs. Increase exercise as tolerated. Continue current meds       Essential hypertension    Well controlled, no changes to meds. Encouraged heart healthy diet such as the DASH diet and exercise as tolerated.       Hyperlipidemia    Encouraged heart  healthy diet, increase exercise, avoid trans fats, consider a krill oil cap daily      Morbid obesity ([place])    Pt is work ing on diet  Unable to exercise due to her back problems  Other Visit Diagnoses    Uncontrolled type 2 diabetes mellitus with hyperglycemia ([place])    -  Primary   Relevant Orders   Lipid panel   Hemoglobin A1c   Comprehensive metabolic panel   Microalbumin / creatinine urine ratio   Hyperlipidemia associated with type 2 diabetes mellitus (HCC)       Relevant Orders   Lipid panel   Hemoglobin A1c   Comprehensive metabolic panel   Microalbumin / creatinine urine ratio   Primary hypertension       Relevant Orders   Lipid panel   Hemoglobin A1c   Comprehensive metabolic panel   Microalbumin / creatinine urine ratio   Hypothyroidism, unspecified type       Relevant Orders   TSH   Bipolar 1 disorder ([place])       Relevant Orders   Lithium level      Follow-up: Return in about 6 months (around 03/22/2021), or if symptoms worsen or fail to improve, for hypertension, hyperlipidemia, diabetes II.  Ann Held, DO

## 2020-09-22 NOTE — Assessment & Plan Note (Signed)
Encouraged heart healthy diet, increase exercise, avoid trans fats, consider a krill oil cap daily 

## 2020-09-22 NOTE — Assessment & Plan Note (Signed)
hgba1c to be checked minimize simple carbs. Increase exercise as tolerated. Continue current meds 

## 2020-09-22 NOTE — Assessment & Plan Note (Signed)
Well controlled, no changes to meds. Encouraged heart healthy diet such as the DASH diet and exercise as tolerated.  °

## 2020-09-22 NOTE — Assessment & Plan Note (Signed)
Pt is work ing on diet  Unable to exercise due to her back problems

## 2020-09-22 NOTE — Patient Instructions (Signed)
Carbohydrate Counting for Diabetes Mellitus, Adult  Carbohydrate counting is a method of keeping track of how many carbohydrates you eat. Eating carbohydrates naturally increases the amount of sugar (glucose) in the blood. Counting how many carbohydrates you eat helps keep your blood glucose within normal limits, which helps you manage your diabetes (diabetes mellitus). It is important to know how many carbohydrates you can safely have in each meal. This is different for every person. A diet and nutrition specialist (registered dietitian) can help you make a meal plan and calculate how many carbohydrates you should have at each meal and snack. Carbohydrates are found in the following foods:  Grains, such as breads and cereals.  Dried beans and soy products.  Starchy vegetables, such as potatoes, peas, and corn.  Fruit and fruit juices.  Milk and yogurt.  Sweets and snack foods, such as cake, cookies, candy, chips, and soft drinks. How do I count carbohydrates? There are two ways to count carbohydrates in food. You can use either of the methods or a combination of both. Reading "Nutrition Facts" on packaged food The "Nutrition Facts" list is included on the labels of almost all packaged foods and beverages in the [place] It includes:  The serving size.  Information about nutrients in each serving, including the grams (g) of carbohydrate per serving. To use the "Nutrition Facts":  Decide how many servings you will have.  Multiply the number of servings by the number of carbohydrates per serving.  The resulting number is the total amount of carbohydrates that you will be having. Learning standard serving sizes of other foods When you eat carbohydrate foods that are not packaged or do not include "Nutrition Facts" on the label, you need to measure the servings in order to count the amount of carbohydrates:  Measure the foods that you will eat with a food scale or measuring cup, if  needed.  Decide how many standard-size servings you will eat.  Multiply the number of servings by 15. Most carbohydrate-rich foods have about 15 g of carbohydrates per serving. ? For example, if you eat 8 oz (170 g) of strawberries, you will have eaten 2 servings and 30 g of carbohydrates (2 servings x 15 g = 30 g).  For foods that have more than one food mixed, such as soups and casseroles, you must count the carbohydrates in each food that is included. The following list contains standard serving sizes of common carbohydrate-rich foods. Each of these servings has about 15 g of carbohydrates:   hamburger bun or  English muffin.   oz (15 mL) syrup.   oz (14 g) jelly.  1 slice of bread.  1 six-inch tortilla.  3 oz (85 g) cooked rice or pasta.  4 oz (113 g) cooked dried beans.  4 oz (113 g) starchy vegetable, such as peas, corn, or potatoes.  4 oz (113 g) hot cereal.  4 oz (113 g) mashed potatoes or  of a large baked potato.  4 oz (113 g) canned or frozen fruit.  4 oz (120 mL) fruit juice.  4-6 crackers.  6 chicken nuggets.  6 oz (170 g) unsweetened dry cereal.  6 oz (170 g) plain fat-free yogurt or yogurt sweetened with artificial sweeteners.  8 oz (240 mL) milk.  8 oz (170 g) fresh fruit or one small piece of fruit.  24 oz (680 g) popped popcorn. Example of carbohydrate counting Sample meal  3 oz (85 g) chicken breast.  6 oz (170 g)   brown rice.  4 oz (113 g) corn.  8 oz (240 mL) milk.  8 oz (170 g) strawberries with sugar-free whipped topping. Carbohydrate calculation 1. Identify the foods that contain carbohydrates: ? Rice. ? Corn. ? Milk. ? Strawberries. 2. Calculate how many servings you have of each food: ? 2 servings rice. ? 1 serving corn. ? 1 serving milk. ? 1 serving strawberries. 3. Multiply each number of servings by 15 g: ? 2 servings rice x 15 g = 30 g. ? 1 serving corn x 15 g = 15 g. ? 1 serving milk x 15 g = 15 g. ? 1  serving strawberries x 15 g = 15 g. 4. Add together all of the amounts to find the total grams of carbohydrates eaten: ? 30 g + 15 g + 15 g + 15 g = 75 g of carbohydrates total. Summary  Carbohydrate counting is a method of keeping track of how many carbohydrates you eat.  Eating carbohydrates naturally increases the amount of sugar (glucose) in the blood.  Counting how many carbohydrates you eat helps keep your blood glucose within normal limits, which helps you manage your diabetes.  A diet and nutrition specialist (registered dietitian) can help you make a meal plan and calculate how many carbohydrates you should have at each meal and snack. This information is not intended to replace advice given to you by your health care provider. Make sure you discuss any questions you have with your health care provider. Document Revised: 05/23/2017 Document Reviewed: 04/11/2016 Elsevier Patient Education  2020 [employer]  

## 2020-09-23 LAB — LITHIUM LEVEL: Lithium Lvl: 0.7 mmol/L (ref 0.6–1.2)

## 2020-09-25 ENCOUNTER — Other Ambulatory Visit: Payer: Self-pay | Admitting: Family Medicine

## 2020-09-25 DIAGNOSIS — E039 Hypothyroidism, unspecified: Secondary | ICD-10-CM

## 2020-09-26 ENCOUNTER — Telehealth: Payer: Self-pay | Admitting: Family Medicine

## 2020-09-26 NOTE — Telephone Encounter (Signed)
Patient states she would like call back in reference to her lab results , patient states she doesn't understand them.

## 2020-09-28 ENCOUNTER — Other Ambulatory Visit: Payer: Self-pay

## 2020-09-28 MED ORDER — LEVOTHYROXINE SODIUM 50 MCG PO TABS: 50.0000 ug | ORAL_TABLET | Freq: Every day | ORAL | 2 refills | Status: DC

## 2020-09-28 NOTE — Telephone Encounter (Signed)
Patient called.  New medication sent in.

## 2020-10-09 ENCOUNTER — Other Ambulatory Visit: Payer: Self-pay | Admitting: Physician Assistant

## 2020-10-10 DIAGNOSIS — M47816 Spondylosis without myelopathy or radiculopathy, lumbar region: Secondary | ICD-10-CM | POA: Diagnosis not present

## 2020-10-11 ENCOUNTER — Telehealth (INDEPENDENT_AMBULATORY_CARE_PROVIDER_SITE_OTHER): Payer: Medicare Other | Admitting: Physician Assistant

## 2020-10-11 ENCOUNTER — Encounter: Payer: Self-pay | Admitting: Physician Assistant

## 2020-10-11 DIAGNOSIS — G2581 Restless legs syndrome: Secondary | ICD-10-CM

## 2020-10-11 DIAGNOSIS — E032 Hypothyroidism due to medicaments and other exogenous substances: Secondary | ICD-10-CM | POA: Diagnosis not present

## 2020-10-11 DIAGNOSIS — F419 Anxiety disorder, unspecified: Secondary | ICD-10-CM

## 2020-10-11 DIAGNOSIS — F3181 Bipolar II disorder: Secondary | ICD-10-CM | POA: Diagnosis not present

## 2020-10-11 MED ORDER — DULOXETINE HCL 20 MG PO CPEP: 40.0000 mg | ORAL_CAPSULE | Freq: Every day | ORAL | 1 refills | Status: DC

## 2020-10-11 MED ORDER — DULOXETINE HCL 60 MG PO CPEP: 60.0000 mg | ORAL_CAPSULE | Freq: Every morning | ORAL | 1 refills | Status: DC

## 2020-10-11 NOTE — Progress Notes (Signed)
[facility]  Patient ID: Megan Duncan,  MRN: [id number]  PCP: Ann Held, DO  Date of Evaluation: 10/11/2020 Time spent:30 minutes  Chief Complaint:  Chief Complaint    Anxiety; Depression     Virtual Visit via Telehealth  I connected with patient by a video enabled telemedicine application with their informed consent, and verified patient privacy and that I am speaking with the correct person using two identifiers.  I am private, in my office and the patient is at home.  I discussed the limitations, risks, security and privacy concerns of performing an evaluation and management service by video and the availability of in person appointments.  We attempted the video call but were cut off.  We ended the visit by phone.  I also discussed with the patient that there may be a patient responsible charge related to this service. The patient expressed understanding and agreed to proceed.   I discussed the assessment and treatment plan with the patient. The patient was provided an opportunity to ask questions and all were answered. The patient agreed with the plan and demonstrated an understanding of the instructions.   The patient was advised to call back or seek an in-person evaluation if the symptoms worsen or if the condition fails to improve as anticipated.  I provided 30 minutes of non-face-to-face time during this encounter.  HISTORY/CURRENT STATUS: HPI For routine med check.  The biggest problem is her chronic back pain. See ROS. Has been staying in her bedroom for months now, it's upstairs and she can't go up and down the stairs easily. She has started back playing bridge again some.  The only time she is not in pain though is when she is lying in bed or sitting.  She has excruciating pain when she walks.  She is really hopeful that the procedure she had yesterday will be helpful.  It was not for her back problems, states she would be doing well.   Because of that though she has a hard time enjoying anything.  She still has her sense of humor about her.  Energy and motivation of course are low due to the physical problems.  She does not cry easily.  Appetite is normal.  She had purposely lost weight over the past 6 months but then she gained it back.  It is hard since she cannot even walk to exercise.  Denies suicidal or homicidal thoughts.  Patient denies increased energy with decreased need for sleep, no increased talkativeness, no racing thoughts, no impulsivity or risky behaviors, no increased spending, no increased libido, no grandiosity. No increased irritability. No hallucinations, no paranoia.  Anxiety is controlled. She takes the Tranxene just as much for the restlessness in her legs as she does for anxiety.  It is helpful for both problems.  She does not need to take it daily.  Denies dizziness, syncope, seizures, numbness, tingling, tremor, tics, unsteady gait, slurred speech, confusion. Has chronic low back pain and stiffness.  Individual Medical History/ Review of Systems: Changes? :Yes  had a procedure on her back yesterday. Some sort of nerve block?  Past medications for mental health diagnoses include: Trileptal Lamictal Prozac Zoloft Effexor XR, Cymbalta, Paxil, lithium, Depakote, Latuda, Risperdal, Tegretol, Wellbutrin  Allergies: Atorvastatin, Codeine, Diclofenac, Doxycycline, and Sulfonamide derivatives  Current Medications:  Current Outpatient Medications:  .  albuterol (VENTOLIN HFA) 108 (90 Base) MCG/ACT inhaler, Inhale 2 puffs into the lungs every 4 (four) hours as needed. For shortness of  breath and wheezing, Disp: 18 g, Rfl: 3 .  amLODipine (NORVASC) 5 MG tablet, TAKE 1 TABLET BY MOUTH EVERY DAY, Disp: 90 tablet, Rfl: 1 .  blood glucose meter kit and supplies, Dispense based on patient and insurance preference. Use once a day.  DX Code: E11.9, Disp: 1 each, Rfl: 0 .  budesonide-formoterol (SYMBICORT) 80-4.5 MCG/ACT  inhaler, Inhale 2 puffs into the lungs 2 (two) times daily., Disp: 1 Inhaler, Rfl: 0 .  clorazepate (TRANXENE) 3.75 MG tablet, Take 1 tablet (3.75 mg total) by mouth 2 (two) times daily as needed for anxiety., Disp: 180 tablet, Rfl: 0 .  DULoxetine (CYMBALTA) 20 MG capsule, Take 2 capsules (40 mg total) by mouth daily. Take w/ 60 mg=100 mg, Disp: 180 capsule, Rfl: 1 .  DULoxetine (CYMBALTA) 60 MG capsule, Take 1 capsule (60 mg total) by mouth every morning., Disp: 90 capsule, Rfl: 1 .  glucose blood test strip, Use as instructed once a day.  Dx Code: E11.9, Disp: 30 each, Rfl: 2 .  Lancets MISC, Use as directed once a day.  Dx code: E11.9, Disp: 30 each, Rfl: 2 .  levothyroxine (SYNTHROID) 50 MCG tablet, Take 1 tablet (50 mcg total) by mouth daily., Disp: 30 tablet, Rfl: 2 .  lithium carbonate 150 MG capsule, TAKE 1 CAPSULE (150 MG TOTAL) BY MOUTH DAILY, TAKE  WITH LITHIUM 600 MG  TO EQUAL 750 MG TOTAL, Disp: 90 capsule, Rfl: 1 .  lithium carbonate 300 MG capsule, Take 2 capsules (600 mg total) by mouth at bedtime., Disp: 180 capsule, Rfl: 1 .  metFORMIN (GLUCOPHAGE) 500 MG tablet, TAKE 1 TABLET (500 MG TOTAL) BY MOUTH 2 (TWO) TIMES DAILY WITH A MEAL., Disp: 180 tablet, Rfl: 0 .  nystatin cream (MYCOSTATIN), Apply 1 application topically 2 (two) times daily., Disp: 30 g, Rfl: 0 .  propranolol (INDERAL) 10 MG tablet, TAKE 1 TABLET BY MOUTH TWICE A DAY, Disp: 180 tablet, Rfl: 1 .  cyclobenzaprine (FLEXERIL) 10 MG tablet, Take 1 tablet (10 mg total) by mouth 3 (three) times daily as needed. For muscle spasms (Patient not taking: Reported on 10/11/2020), Disp: 30 tablet, Rfl: 0 .  gabapentin (NEURONTIN) 300 MG capsule, Take by mouth. (Patient not taking: Reported on 10/11/2020), Disp: , Rfl:  .  hydrocortisone-pramoxine (ANALPRAM HC) 2.5-1 % rectal cream, Place 1 application rectally 3 (three) times daily. (Patient not taking: Reported on 10/11/2020), Disp: 30 g, Rfl: 3 .  omega-3 acid ethyl esters  (LOVAZA) 1 g capsule, Take by mouth 2 (two) times daily.  (Patient not taking: Reported on 10/11/2020), Disp: , Rfl:  .  tiZANidine (ZANAFLEX) 4 MG capsule, , Disp: , Rfl:  .  topiramate (TOPAMAX) 25 MG tablet, Take 1 tablet (25 mg total) by mouth 2 (two) times daily. (Patient not taking: Reported on 10/11/2020), Disp: 60 tablet, Rfl: 1 .  traMADol (ULTRAM) 50 MG tablet, 1-2 po q6h prn (Patient not taking: Reported on 10/11/2020), Disp: 60 tablet, Rfl: 1 .  vitamin B-12 (CYANOCOBALAMIN) 100 MCG tablet, Take 100 mcg by mouth daily. (Patient not taking: Reported on 10/11/2020), Disp: , Rfl:  Medication Side Effects: none  Family Medical/ Social History: Changes? No  MENTAL HEALTH EXAM:  There were no vitals taken for this visit.There is no height or weight on file to calculate BMI.  General Appearance: unable to assess  Eye Contact:  unable to assess  Speech:  Clear and Coherent and Normal Rate  Volume:  Normal  Mood:  Euthymic  Affect:  Unable to assess  Thought Process:  Goal Directed and Descriptions of Associations: Intact  Orientation:  Full (Time, Place, and Person)  Thought Content: Logical   Suicidal Thoughts:  No  Homicidal Thoughts:  No  Memory:  WNL  Judgement:  Good  Insight:  Good  Psychomotor Activity:  unable to assess  Concentration:  Concentration: Good and Attention Span: Good  Recall:  Good  Fund of Knowledge: Good  Language: Good  Assets:  Desire for Improvement  ADL's:  Intact  Cognition: WNL  Prognosis:  Good   Most recent pertinent labs: 09/22/2020  lithium level was 0.7 TSH 5.46 CMP   glucose 115, BUN 14, creatinine 0.8, calcium 9.6 Lipid profile cholesterol 201, triglycerides 151, HDL 72.6, LDL 99 Hemoglobin A1c was 6.1  DIAGNOSES:    ICD-10-CM   1. Bipolar 2 disorder (HCC)  F31.81   2. Anxiety  F41.9   3. Restless leg  G25.81   4. 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 I provided 30 minutes of non face-to-face care during this encounter, including time spent reviewing lab results with her.  The lithium level is good so no changes in dosing. Overall, I think her mental health is stable.  I am hopeful that the procedure she had done yesterday will be successful and that will greatly help her mental state.  She is in good spirits despite everything she is going through. Continue Li 750 mg qd.  Continue Synthroid 50 mcg daily. Continue Tranxene 3.75 mg 1 twice daily as needed. Continue Cymbalta 20 mg, 2 p.o. daily with 60 mg pills to equal 100 mg. Continue Cymbalta 60 mg, 1 p.o. daily with 40 mg to equal 100 mg. Continue Inderal 10 mg 1 twice daily. Continue fish oil, B complex and multivitamin. Return in 3 months.   Donnal Moat, PA-C

## 2020-10-24 DIAGNOSIS — M4316 Spondylolisthesis, lumbar region: Secondary | ICD-10-CM | POA: Diagnosis not present

## 2020-10-24 DIAGNOSIS — M5136 Other intervertebral disc degeneration, lumbar region: Secondary | ICD-10-CM | POA: Diagnosis not present

## 2020-10-24 DIAGNOSIS — M545 Low back pain, unspecified: Secondary | ICD-10-CM | POA: Diagnosis not present

## 2020-10-24 DIAGNOSIS — M48061 Spinal stenosis, lumbar region without neurogenic claudication: Secondary | ICD-10-CM | POA: Diagnosis not present

## 2020-10-24 DIAGNOSIS — M4310 Spondylolisthesis, site unspecified: Secondary | ICD-10-CM | POA: Diagnosis not present

## 2020-10-24 DIAGNOSIS — M47816 Spondylosis without myelopathy or radiculopathy, lumbar region: Secondary | ICD-10-CM | POA: Diagnosis not present

## 2020-11-13 ENCOUNTER — Other Ambulatory Visit: Payer: Self-pay | Admitting: Physician Assistant

## 2020-11-14 ENCOUNTER — Other Ambulatory Visit: Payer: Self-pay | Admitting: Physician Assistant

## 2020-12-12 DIAGNOSIS — M47816 Spondylosis without myelopathy or radiculopathy, lumbar region: Secondary | ICD-10-CM | POA: Diagnosis not present

## 2020-12-13 ENCOUNTER — Other Ambulatory Visit: Payer: Self-pay | Admitting: Family Medicine

## 2020-12-18 ENCOUNTER — Other Ambulatory Visit: Payer: Self-pay | Admitting: Family Medicine

## 2020-12-18 DIAGNOSIS — I10 Essential (primary) hypertension: Secondary | ICD-10-CM

## 2021-01-02 DIAGNOSIS — M48061 Spinal stenosis, lumbar region without neurogenic claudication: Secondary | ICD-10-CM | POA: Diagnosis not present

## 2021-01-02 DIAGNOSIS — M4316 Spondylolisthesis, lumbar region: Secondary | ICD-10-CM | POA: Diagnosis not present

## 2021-01-02 DIAGNOSIS — M47816 Spondylosis without myelopathy or radiculopathy, lumbar region: Secondary | ICD-10-CM | POA: Diagnosis not present

## 2021-01-02 DIAGNOSIS — M545 Low back pain, unspecified: Secondary | ICD-10-CM | POA: Diagnosis not present

## 2021-01-02 DIAGNOSIS — M5136 Other intervertebral disc degeneration, lumbar region: Secondary | ICD-10-CM | POA: Diagnosis not present

## 2021-01-02 DIAGNOSIS — M4310 Spondylolisthesis, site unspecified: Secondary | ICD-10-CM | POA: Diagnosis not present

## 2021-01-11 DIAGNOSIS — M545 Low back pain, unspecified: Secondary | ICD-10-CM | POA: Diagnosis not present

## 2021-01-11 DIAGNOSIS — M47816 Spondylosis without myelopathy or radiculopathy, lumbar region: Secondary | ICD-10-CM | POA: Diagnosis not present

## 2021-01-11 DIAGNOSIS — M5136 Other intervertebral disc degeneration, lumbar region: Secondary | ICD-10-CM | POA: Diagnosis not present

## 2021-01-11 DIAGNOSIS — Z6836 Body mass index (BMI) 36.0-36.9, adult: Secondary | ICD-10-CM | POA: Insufficient documentation

## 2021-01-11 DIAGNOSIS — M4310 Spondylolisthesis, site unspecified: Secondary | ICD-10-CM | POA: Diagnosis not present

## 2021-01-11 DIAGNOSIS — I10 Essential (primary) hypertension: Secondary | ICD-10-CM | POA: Diagnosis not present

## 2021-01-15 ENCOUNTER — Other Ambulatory Visit: Payer: Self-pay | Admitting: Physician Assistant

## 2021-01-19 DIAGNOSIS — E119 Type 2 diabetes mellitus without complications: Secondary | ICD-10-CM | POA: Diagnosis not present

## 2021-01-19 DIAGNOSIS — H33302 Unspecified retinal break, left eye: Secondary | ICD-10-CM | POA: Diagnosis not present

## 2021-01-19 DIAGNOSIS — H524 Presbyopia: Secondary | ICD-10-CM | POA: Diagnosis not present

## 2021-01-19 DIAGNOSIS — H35372 Puckering of macula, left eye: Secondary | ICD-10-CM | POA: Diagnosis not present

## 2021-01-19 LAB — HM DIABETES EYE EXAM

## 2021-01-23 ENCOUNTER — Telehealth: Payer: Self-pay | Admitting: Physician Assistant

## 2021-01-23 NOTE — Telephone Encounter (Signed)
[facility] [place] is confused about the dosing for [employer]. They wanted her to call so Helene Kelp could confirm her dosing instructions. The have dosing several ways.    [id number] [address], [place] Phone:  [phone]  Fax:  [phone]

## 2021-01-24 NOTE — Telephone Encounter (Signed)
Contacted pharmacist to clarify Lithium 300 mg 2 at hs and Lithium 150 mg at hs.

## 2021-01-24 NOTE — Telephone Encounter (Signed)
According to the Pt, she should have a Rx for Lithium 300 mg take 2 tabs HS and another Rx for lithium 150 mg take 1 tab HS but the pharmacy is trying to give her Lithium 300 mg 3 tabs HS instead of giving her the 2 separate Rx's.

## 2021-01-24 NOTE — Telephone Encounter (Signed)
That doesn't make sense because she should be on 750 mg daily of lithium not 900 mg according to notes. I will contact pharmacy.

## 2021-01-26 DIAGNOSIS — M5416 Radiculopathy, lumbar region: Secondary | ICD-10-CM | POA: Insufficient documentation

## 2021-01-26 HISTORY — DX: Radiculopathy, lumbar region: M54.16

## 2021-01-31 ENCOUNTER — Other Ambulatory Visit: Payer: Self-pay

## 2021-01-31 ENCOUNTER — Encounter: Payer: Self-pay | Admitting: Family Medicine

## 2021-01-31 ENCOUNTER — Ambulatory Visit (INDEPENDENT_AMBULATORY_CARE_PROVIDER_SITE_OTHER): Payer: Medicare Other | Admitting: Family Medicine

## 2021-01-31 VITALS — BP 140/90 | HR 77 | Temp 99.0°F | Resp 20 | Ht 62.25 in | Wt 199.6 lb

## 2021-01-31 DIAGNOSIS — D229 Melanocytic nevi, unspecified: Secondary | ICD-10-CM

## 2021-01-31 NOTE — Progress Notes (Signed)
Patient ID: Megan Duncan, female    DOB: [birth year]  Age: 75 y.o. MRN: [id number]    Subjective:  Subjective  HPI Megan Duncan presents for office visit today. She came to check on a mole on her abdomen and back that the moles appeared x 2 months. She describes the color starting as green and then after picking at it and covering it with a Band-Aid it turned black. She denies seeing a dermatologist.   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 This is being managed by Optho. She denies any chest pain, SOB, fever, abdominal pain, cough, chills, sore throat, dysuria, urinary incontinence, back pain, HA, or N/VD at this time.   Review of Systems  Constitutional: Negative for chills, diaphoresis, fatigue and fever.  HENT: Negative for congestion, rhinorrhea, sinus pressure, sinus pain and sore throat.   Respiratory: Negative for cough and shortness of breath.   Cardiovascular: Negative for chest pain, palpitations and leg swelling.  Gastrointestinal: Negative for blood in stool, diarrhea, nausea and vomiting.  Genitourinary: Negative for flank pain, frequency, vaginal bleeding, vaginal discharge and vaginal pain.  Musculoskeletal: Negative for back pain and myalgias.  Skin:       (+)skin changes on abdomen and back  Neurological: Negative for headaches.    History Past Medical History:  Diagnosis Date  . Anxiety   . Arthritis    hands  . Bipolar II disorder ([place])    Dr. Charlott Holler  . COPD (chronic obstructive pulmonary disease) ([place])   . Depression   . Diastolic dysfunction    Per pt, diagnosed after [facility]  . Diverticulosis   . Fibromyalgia   . Hyperlipidemia   . Hypertension   . LBP (low back pain)     She has a past surgical history that includes Appendectomy; Total abdominal hysterectomy; Tonsillectomy; and Knee arthroscopy (Right, 2008).   Her family history includes  Alcoholism in her father; Breast cancer in her maternal aunt; Colon cancer (age of onset: 28) in her maternal aunt; Diabetes in her father; Heart attack in her father; Hiatal hernia in her mother; Hypertension in her father and mother; Mental illness in her sister.She reports that she quit smoking about 16 years ago. She has a 60.00 pack-year smoking history. She has never used smokeless tobacco. She reports current alcohol use. She reports that she does not use drugs.  Current Outpatient Medications on File Prior to Visit  Medication Sig Dispense Refill  . albuterol (VENTOLIN HFA) 108 (90 Base) MCG/ACT inhaler Inhale 2 puffs into the lungs every 4 (four) hours as needed. For shortness of breath and wheezing 18 g 3  . amLODipine (NORVASC) 5 MG tablet Take 1 tablet (5 mg total) by mouth daily. 90 tablet 1  . blood glucose meter kit and supplies Dispense based on patient and insurance preference. Use once a day.  DX Code: E11.9 1 each 0  . budesonide-formoterol (SYMBICORT) 80-4.5 MCG/ACT inhaler Inhale 2 puffs into the lungs 2 (two) times daily. 1 Inhaler 0  . clorazepate (TRANXENE) 3.75 MG tablet Take 1 tablet (3.75 mg total) by mouth 2 (two) times daily as needed for anxiety. 180 tablet 0  . cyclobenzaprine (FLEXERIL) 10 MG tablet Take 1 tablet (10 mg total) by mouth 3 (three) times daily as needed. For muscle spasms 30 tablet 0  . DULoxetine (CYMBALTA) 20 MG capsule TAKE 2 CAPSULES BY MOUTH DAILY  TAKE WITH 60 MG 180 capsule 1  . DULoxetine (CYMBALTA) 60 MG capsule TAKE 1 CAPSULE BY MOUTH EVERY MORNING 90 capsule 1  . gabapentin (NEURONTIN) 300 MG capsule Take by mouth.    Marland Kitchen glucose blood test strip Use as instructed once a day.  Dx Code: E11.9 30 each 2  . hydrocortisone-pramoxine (ANALPRAM HC) 2.5-1 % rectal cream Place 1 application rectally 3 (three) times daily. 30 g 3  . Lancets MISC Use as directed once a day.  Dx code: E11.9 30 each 2  . levothyroxine (SYNTHROID) 50 MCG tablet Take 1 tablet  (50 mcg total) by mouth daily before breakfast. 90 tablet 1  . lithium carbonate 150 MG capsule TAKE 1 CAPSULE (150 MG TOTAL) BY MOUTH DAILY, TAKE  WITH LITHIUM 600 MG  TO EQUAL 750 MG TOTAL 90 capsule 1  . lithium carbonate 300 MG capsule Take 2 capsules (600 mg total) by mouth at bedtime. 180 capsule 1  . metFORMIN (GLUCOPHAGE) 500 MG tablet Take 1 tablet (500 mg total) by mouth 2 (two) times daily with a meal. 180 tablet 1  . nystatin cream (MYCOSTATIN) Apply 1 application topically 2 (two) times daily. 30 g 0  . omega-3 acid ethyl esters (LOVAZA) 1 g capsule Take by mouth 2 (two) times daily.    . propranolol (INDERAL) 10 MG tablet TAKE 1 TABLET BY MOUTH TWICE A DAY 180 tablet 1  . tiZANidine (ZANAFLEX) 4 MG capsule     . topiramate (TOPAMAX) 25 MG tablet Take 1 tablet (25 mg total) by mouth 2 (two) times daily. 60 tablet 1  . traMADol (ULTRAM) 50 MG tablet 1-2 po q6h prn 60 tablet 1  . vitamin B-12 (CYANOCOBALAMIN) 100 MCG tablet Take 100 mcg by mouth daily.     No current facility-administered medications on file prior to visit.     Objective:  Objective  Physical Exam Constitutional:      General: She is not in acute distress.    Appearance: Normal appearance. She is well-developed. She is not ill-appearing.  HENT:     Head: Normocephalic and atraumatic.     Right Ear: External ear normal.     Left Ear: External ear normal.     Nose: Nose normal.  Eyes:     Extraocular Movements: Extraocular movements intact.     Pupils: Pupils are equal, round, and reactive to light.  Cardiovascular:     Rate and Rhythm: Normal rate and regular rhythm.     Heart sounds: No murmur heard.   Pulmonary:     Effort: Pulmonary effort is normal. No respiratory distress.     Breath sounds: Normal breath sounds. No wheezing, rhonchi or rales.  Abdominal:     General: Bowel sounds are normal.     Palpations: Abdomen is soft. There is no mass.     Tenderness: There is no abdominal tenderness.  There is no guarding.     Hernia: No hernia is present.  Musculoskeletal:     Cervical back: Normal range of motion and neck supple.  Skin:    General: Skin is warm and dry.     Comments: There is a mole with stalk --- black in color on low abd , tender to touch  Neurological:     Mental Status: She is alert and oriented to person, place, and time.      BP 140/90 (BP Location: Right Arm, Patient Position: Sitting, Cuff Size: Large)   Pulse 77   Temp  99 F (37.2 C) (Oral)   Resp 20   Ht 5' 2.25" (1.581 m)   Wt 199 lb 9.6 oz (90.5 kg)   SpO2 97%   BMI 36.21 kg/m  Wt Readings from Last 3 Encounters:  01/31/21 199 lb 9.6 oz (90.5 kg)  09/22/20 197 lb (89.4 kg)  05/25/20 189 lb 3.2 oz (85.8 kg)     Lab Results  Component Value Date   WBC 7.7 08/30/2017   HGB 15.1 08/30/2017   HCT 45.4 (H) 08/30/2017   PLT 393 08/30/2017   GLUCOSE 115 (H) 09/22/2020   CHOL 201 (H) 09/22/2020   TRIG 151.0 (H) 09/22/2020   HDL 72.60 09/22/2020   LDLDIRECT 120.1 12/03/2013   LDLCALC 99 09/22/2020   ALT 12 09/22/2020   AST 16 09/22/2020   NA 139 09/22/2020   K 4.5 09/22/2020   CL 103 09/22/2020   CREATININE 0.80 09/22/2020   BUN 14 09/22/2020   CO2 26 09/22/2020   TSH 5.46 (H) 09/22/2020   INR 1.0 10/25/2015   HGBA1C 6.1 09/22/2020   MICROALBUR 2.7 (H) 09/22/2020    MR LUMBAR SPINE WO CONTRAST  Result Date: 08/11/2020 CLINICAL DATA:  Back pain and leg weakness EXAM: MRI LUMBAR SPINE WITHOUT CONTRAST TECHNIQUE: Multiplanar, multisequence MR imaging of the lumbar spine was performed. No intravenous contrast was administered. COMPARISON:  07/24/2019 FINDINGS: Segmentation:  Standard. Alignment: Grade 1 retrolisthesis at L3-4 and grade 1 anterolisthesis at L4-5, unchanged. Vertebrae:  No fracture, evidence of discitis, or bone lesion. Conus medullaris and cauda equina: Conus extends to the L1 level. Conus and cauda equina appear normal. Paraspinal and other soft tissues: 3.6 cm right renal  cyst is unchanged Disc levels: L1-L2: Small disc bulge. There is no spinal canal stenosis. No neural foraminal stenosis. L2-L3: Unchanged small disc bulge. There is no spinal canal stenosis. No neural foraminal stenosis. L3-L4: Unchanged small disc bulge and mild facet hypertrophy. There is no spinal canal stenosis. No neural foraminal stenosis. L4-L5: Unchanged intermediate sized disc bulge with severe facet hypertrophy. Moderate spinal canal stenosis. Mild bilateral neural foraminal stenosis. L5-S1: Small disc bulge and moderate facet hypertrophy, unchanged. There is no spinal canal stenosis. No neural foraminal stenosis. Visualized sacrum: Normal. IMPRESSION: 1. Unchanged examination with moderate L4-5 spinal canal stenosis and mild bilateral neural foraminal stenosis. 2. Unchanged grade 1 retrolisthesis at L3-4 and grade 1 anterolisthesis at L4-5. Electronically Signed   By: Ulyses Jarred M.D.   On: 08/11/2020 22:33     Assessment & Plan:  Plan    No orders of the defined types were placed in this encounter.   Problem List Items Addressed This Visit   None   Visit Diagnoses    Suspicious nevus    -  Primary   Relevant Orders   Ambulatory referral to Dermatology      Follow-up: Return if symptoms worsen or fail to improve.  I,David Hanna,acting as a Education administrator for [employer], DO.,have documented all relevant documentation on the behalf of Ann Held, DO,as directed by  Ann Held, DO while in the presence of [place], DO, have reviewed all documentation for this visit. The documentation on 01/31/21 for the exam, diagnosis, procedures, and orders are all accurate and complete. Ann Held, DO

## 2021-01-31 NOTE — Patient Instructions (Signed)
Mole A mole is a colored (pigmented) growth on the skin. Moles are very common. They are usually harmless, but some moles can become cancerous over time. What are the causes? Moles are caused when pigmented skin cells grow together in clusters instead of spreading out in the skin as they normally do. The reason why the skin cells grow together in clusters is not known. What increases the risk? You are more likely to develop a mole if you:  Have family members who have moles.  Are white.  Have blond hair.  Are often outdoors and exposed to the sun.  Received phototherapy when you were a newborn baby.  Are female. What are the signs or symptoms? A mole may be:  [place] or black.  Flat or raised.  Smooth or wrinkled.   How is this diagnosed? A mole is diagnosed with a skin exam. If your health care provider thinks a mole may be cancerous, all or part of the mole will be removed for testing (biopsy). How is this treated? Most moles are noncancerous (benign) and do not require treatment. If a mole is found to be cancerous, it will be removed. You may also choose to have a mole removed if it is causing pain or if you do not like the way it looks. Follow these instructions at home: General instructions  Every month, look for new moles and check your existing moles for changes. This is important because a change in a mole can mean that the mole has become cancerous.  ABCDE changes in a mole indicate that you should be evaluated by your health care provider. ABCDE stands for: ? Asymmetry. This means the mole has an irregular shape. It is not round or oval. ? Border. This means the mole has an irregular or bumpy border. ? Color. This means the mole has multiple colors in it, including brown, black, blue, red, or tan. Note that it is normal for moles to get darker when a woman is pregnant or takes birth control pills. ? Diameter. This means the mole is more than 0.2 inches (6 mm)  across. ? Evolving. This refers to any unusual changes or symptoms in the mole, such as pain, itching, stinging, sensitivity, or bleeding.  If you have a large number of moles, see a skin doctor (dermatologist) at least one time every year for a full-body skin check.   Lifestyle  When you are outdoors, wear sunscreen with SPF 30 (sun protection factor 30) or higher.  Use an adequate amount of sunscreen to cover exposed areas of skin. Put it on 30 minutes before you go out. Reapply it every 2 hours or anytime you come out of the water.  When you are out in the sun, wear a broad-brimmed hat and clothing that covers your arms and legs. Wear wraparound sunglasses.   Contact a health care provider if:  The size, shape, borders, or color of your mole changes.  Your mole, or the skin near the mole, becomes painful, sore, red, or swollen.  Your mole: ? Develops more than one color. ? Itches or bleeds. ? Becomes scaly, sheds skin, or oozes fluid. ? Becomes flat or develops raised areas. ? Becomes hard or soft.  You develop a new mole. Summary  A mole is a colored (pigmented) growth on the skin. Moles are very common. They are usually harmless, but some moles can become cancerous over time.  Every month, look for new moles and check your existing moles for  changes. This is important because a change in a mole can mean that the mole has become cancerous.  If you have a large number of moles, see a skin doctor (dermatologist) at least one time every year for a full-body skin check.  When you are outdoors, wear sunscreen with SPF 30 (sun protection factor 30) or higher. Reapply it every 2 hours or anytime you come out of the water.  Contact a health care provider if you notice changes in a mole or if you develop a new mole. This information is not intended to replace advice given to you by your health care provider. Make sure you discuss any questions you have with your health care  provider. Document Revised: 06/04/2019 Document Reviewed: 03/25/2018 Elsevier Patient Education  [place].

## 2021-02-02 ENCOUNTER — Telehealth: Payer: Self-pay | Admitting: Physician Assistant

## 2021-02-02 NOTE — Telephone Encounter (Signed)
Pt called reporting she's having a lot of anxiety and needs to see what to do. Has not had  in years.  Apt 4/19. Call pt# [phone].

## 2021-02-02 NOTE — Telephone Encounter (Signed)
Anxiety is usually controlled. She takes Tranxene bid.   Please review

## 2021-02-03 NOTE — Telephone Encounter (Signed)
Rtc to [facility] and she reports she only occasionally takes 1 tranxene at hs. She's worried about becoming addicted to it. She really doesn't want to take anything during the day. She wants to try 2 tranxene and see how she does with that. She does not want to increase Cymbalta.  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    Informed her I would update Helene Kelp with information. Advised pt to call back if sx's worsen or no improvement.

## 2021-02-03 NOTE — Telephone Encounter (Signed)
If the Tranxene 3.75 mg bid is effective but not lasting long enough, increase to tid prn. Let me know and I'll send in Rx. If the anxiety is more generalized I'd also recommend increasing Cymbalta from 100 mg to 120 mg.

## 2021-02-03 NOTE — Telephone Encounter (Signed)
noted 

## 2021-02-07 DIAGNOSIS — H2513 Age-related nuclear cataract, bilateral: Secondary | ICD-10-CM | POA: Diagnosis not present

## 2021-02-07 DIAGNOSIS — E119 Type 2 diabetes mellitus without complications: Secondary | ICD-10-CM | POA: Diagnosis not present

## 2021-02-16 DIAGNOSIS — D485 Neoplasm of uncertain behavior of skin: Secondary | ICD-10-CM | POA: Diagnosis not present

## 2021-02-16 DIAGNOSIS — L820 Inflamed seborrheic keratosis: Secondary | ICD-10-CM | POA: Diagnosis not present

## 2021-02-28 ENCOUNTER — Ambulatory Visit: Payer: Medicare Other | Admitting: Physician Assistant

## 2021-03-02 ENCOUNTER — Other Ambulatory Visit: Payer: Self-pay | Admitting: Physician Assistant

## 2021-03-08 ENCOUNTER — Other Ambulatory Visit: Payer: Self-pay | Admitting: Physician Assistant

## 2021-03-08 DIAGNOSIS — Z79899 Other long term (current) drug therapy: Secondary | ICD-10-CM

## 2021-03-10 DIAGNOSIS — H2513 Age-related nuclear cataract, bilateral: Secondary | ICD-10-CM | POA: Diagnosis not present

## 2021-03-10 DIAGNOSIS — H35372 Puckering of macula, left eye: Secondary | ICD-10-CM | POA: Diagnosis not present

## 2021-03-24 ENCOUNTER — Other Ambulatory Visit: Payer: Self-pay

## 2021-03-24 DIAGNOSIS — Z79899 Other long term (current) drug therapy: Secondary | ICD-10-CM

## 2021-03-24 MED ORDER — LITHIUM CARBONATE 300 MG PO CAPS: ORAL_CAPSULE | ORAL | 0 refills | Status: DC

## 2021-03-24 NOTE — Progress Notes (Signed)
Pt called reporting she picked up her Lithium and pharmacy gave her the 300 mg Lithium as 3 capsules at hs. Pt should be taking 750 mg total, 2 of the 600 mg and 1 of the 150 mg capsule.  The lithium 150 mg capsule was correct, take 1 at hs.  Pt is aware of her correct dose and reports she has a lot of 300 mg capsules and won't need a refill in a long time.   Updated Rx sent to pharmacy

## 2021-04-04 DIAGNOSIS — H2512 Age-related nuclear cataract, left eye: Secondary | ICD-10-CM | POA: Diagnosis not present

## 2021-04-04 DIAGNOSIS — H25812 Combined forms of age-related cataract, left eye: Secondary | ICD-10-CM | POA: Diagnosis not present

## 2021-04-19 ENCOUNTER — Encounter: Payer: Medicare Other | Admitting: Physician Assistant

## 2021-04-19 NOTE — Progress Notes (Signed)
Erroneous encounter. Patient trying to get in touch with her behavioral health specialist.

## 2021-04-20 ENCOUNTER — Telehealth (INDEPENDENT_AMBULATORY_CARE_PROVIDER_SITE_OTHER): Payer: Medicare Other | Admitting: Physician Assistant

## 2021-04-20 DIAGNOSIS — G2581 Restless legs syndrome: Secondary | ICD-10-CM | POA: Diagnosis not present

## 2021-04-20 DIAGNOSIS — F3181 Bipolar II disorder: Secondary | ICD-10-CM

## 2021-04-20 DIAGNOSIS — E032 Hypothyroidism due to medicaments and other exogenous substances: Secondary | ICD-10-CM | POA: Diagnosis not present

## 2021-04-20 DIAGNOSIS — Z79899 Other long term (current) drug therapy: Secondary | ICD-10-CM

## 2021-04-20 DIAGNOSIS — F419 Anxiety disorder, unspecified: Secondary | ICD-10-CM

## 2021-04-20 MED ORDER — PROPRANOLOL HCL 20 MG PO TABS: 20.0000 mg | ORAL_TABLET | Freq: Two times a day (BID) | ORAL | 1 refills | Status: DC

## 2021-04-20 NOTE — Progress Notes (Signed)
[facility]  Patient ID: Megan Duncan,  MRN: [id number]  PCP: Donato Schultz, DO  Date of Evaluation: 04/20/2021 Time spent:40 minutes  Chief Complaint:  Chief Complaint   Anxiety; Depression     Virtual Visit via Telehealth  I connected with patient by a video enabled telemedicine application with their informed consent, and verified patient privacy and that I am speaking with the correct person using two identifiers.  I am private, in my office and the patient is at home.  I discussed the limitations, risks, security and privacy concerns of performing an evaluation and management service by video and the availability of in person appointments.  We attempted the video call but were cut off.  We ended the visit by phone.  I also discussed with the patient that there may be a patient responsible charge related to this service. The patient expressed understanding and agreed to proceed.   I discussed the assessment and treatment plan with the patient. The patient was provided an opportunity to ask questions and all were answered. The patient agreed with the plan and demonstrated an understanding of the instructions.   The patient was advised to call back or seek an in-person evaluation if the symptoms worsen or if the condition fails to improve as anticipated.  I provided 40 minutes of non-face-to-face time during this encounter.  HISTORY/CURRENT STATUS: HPI For routine med check.  31-month overdue, due to physical health problems.  Has a lot of health problems, back pain d/t arthritis and issues with her left eye. Tremors are worse.  Happens almost daily, worse if she is holding something like a book or what ever.  She does have restlessness in her legs at times to where she tosses and turns.  Not every night though.  Despite all of her health problems she is doing well mentally.  Able to enjoy things.  She enjoys playing bridge with her friends.  Energy and  motivation are good.  She does isolate but mostly due to the back pain and difficulty walking due to the pain.  Does not cry easily.  Appetite is normal and weight is stable.  Denies suicidal or homicidal thoughts.  Patient denies increased energy with decreased need for sleep, no increased talkativeness, no racing thoughts, no impulsivity or risky behaviors, no increased spending, no increased libido, no grandiosity, no increased irritability or anger, and no hallucinations.  Anxiety is well controlled for the most part.  Of course if there is an obvious trigger than she does get anxious.  The Tranxene does help.  She also uses Tranxene to help with restlessness in her legs.  Does not need the Tranxene daily.  Denies dizziness, syncope, seizures, numbness, tingling, tremor, tics, unsteady gait, slurred speech, confusion. Has chronic low back pain and stiffness.  Individual Medical History/ Review of Systems: Changes? :Yes  has had 2 surgeries on the left eye for macular tear. Also cataract.   Past medications for mental health diagnoses include: Trileptal Lamictal Prozac Zoloft Effexor XR, Cymbalta, Paxil, lithium, Depakote, Latuda, Risperdal, Tegretol, Wellbutrin  Allergies: Atorvastatin, Codeine, Diclofenac, Doxycycline, and Sulfonamide derivatives  Current Medications:  Current Outpatient Medications:    albuterol (VENTOLIN HFA) 108 (90 Base) MCG/ACT inhaler, Inhale 2 puffs into the lungs every 4 (four) hours as needed. For shortness of breath and wheezing, Disp: 18 g, Rfl: 3   amLODipine (NORVASC) 5 MG tablet, Take 1 tablet (5 mg total) by mouth daily., Disp: 90 tablet, Rfl: 1  budesonide-formoterol (SYMBICORT) 80-4.5 MCG/ACT inhaler, Inhale 2 puffs into the lungs 2 (two) times daily., Disp: 1 Inhaler, Rfl: 0   clorazepate (TRANXENE) 3.75 MG tablet, Take 1 tablet (3.75 mg total) by mouth 2 (two) times daily as needed for anxiety., Disp: 180 tablet, Rfl: 0   DULoxetine (CYMBALTA) 20 MG  capsule, TAKE 2 CAPSULES BY MOUTH DAILY TAKE WITH 60 MG, Disp: 180 capsule, Rfl: 1   DULoxetine (CYMBALTA) 60 MG capsule, TAKE 1 CAPSULE BY MOUTH EVERY MORNING, Disp: 90 capsule, Rfl: 1   glucose blood test strip, Use as instructed once a day.  Dx Code: E11.9, Disp: 30 each, Rfl: 2   levothyroxine (SYNTHROID) 50 MCG tablet, Take 1 tablet (50 mcg total) by mouth daily before breakfast., Disp: 90 tablet, Rfl: 1   lithium carbonate 150 MG capsule, TAKE 1 CAPSULE (150 MG TOTAL) BY MOUTH DAILY, TAKE  WITH LITHIUM 600 MG  TO EQUAL 750 MG TOTAL, Disp: 90 capsule, Rfl: 1   lithium carbonate 300 MG capsule, TAKE 2 CAPSULES (600 MG) BY MOUTH AT BEDTIME, WITH 150 MG CAPSULE, Disp: 180 capsule, Rfl: 0   metFORMIN (GLUCOPHAGE) 500 MG tablet, Take 1 tablet (500 mg total) by mouth 2 (two) times daily with a meal., Disp: 180 tablet, Rfl: 1   nystatin cream (MYCOSTATIN), Apply 1 application topically 2 (two) times daily., Disp: 30 g, Rfl: 0   propranolol (INDERAL) 20 MG tablet, Take 1 tablet (20 mg total) by mouth 2 (two) times daily., Disp: 60 tablet, Rfl: 1   topiramate (TOPAMAX) 25 MG tablet, Take 1 tablet (25 mg total) by mouth 2 (two) times daily., Disp: 60 tablet, Rfl: 1   vitamin B-12 (CYANOCOBALAMIN) 100 MCG tablet, Take 100 mcg by mouth daily., Disp: , Rfl:    blood glucose meter kit and supplies, Dispense based on patient and insurance preference. Use once a day.  DX Code: E11.9, Disp: 1 each, Rfl: 0   cyclobenzaprine (FLEXERIL) 10 MG tablet, Take 1 tablet (10 mg total) by mouth 3 (three) times daily as needed. For muscle spasms (Patient not taking: Reported on 04/20/2021), Disp: 30 tablet, Rfl: 0   gabapentin (NEURONTIN) 300 MG capsule, Take by mouth. (Patient not taking: Reported on 04/20/2021), Disp: , Rfl:    hydrocortisone-pramoxine (ANALPRAM HC) 2.5-1 % rectal cream, Place 1 application rectally 3 (three) times daily., Disp: 30 g, Rfl: 3   Lancets MISC, Use as directed once a day.  Dx code: E11.9, Disp: 30  each, Rfl: 2   omega-3 acid ethyl esters (LOVAZA) 1 g capsule, Take by mouth 2 (two) times daily. (Patient not taking: Reported on 04/20/2021), Disp: , Rfl:    tiZANidine (ZANAFLEX) 4 MG capsule, , Disp: , Rfl:    traMADol (ULTRAM) 50 MG tablet, 1-2 po q6h prn (Patient not taking: Reported on 04/20/2021), Disp: 60 tablet, Rfl: 1 Medication Side Effects: none  Family Medical/ Social History: Changes? No  MENTAL HEALTH EXAM:  There were no vitals taken for this visit.There is no height or weight on file to calculate BMI.  General Appearance: Casual and Well Groomed  Eye Contact:  Good  Speech:  Clear and Coherent, Normal Rate, and Talkative  Volume:  Normal  Mood:  Euthymic  Affect:  Appropriate  Thought Process:  Goal Directed and Descriptions of Associations: Intact  Orientation:  Full (Time, Place, and Person)  Thought Content: Logical   Suicidal Thoughts:  No  Homicidal Thoughts:  No  Memory:  WNL  Judgement:  Good  Insight:  Good  Psychomotor Activity:  Normal from what I can see on the video.  I am only unable to see from shoulders up however.  Concentration:  Concentration: Good and Attention Span: Good  Recall:  Good  Fund of Knowledge: Good  Language: Good  Assets:  Desire for Improvement  ADL's:  Intact  Cognition: WNL  Prognosis:  Good   Most recent pertinent labs: 09/22/2020  lithium level was 0.7 TSH 5.46 CMP   glucose 115, BUN 14, creatinine 0.8, calcium 9.6 Lipid profile cholesterol 201, triglycerides 151, HDL 72.6, LDL 99 Hemoglobin A1c was 6.1  DIAGNOSES:    ICD-10-CM   1. Bipolar 2 disorder (HCC)  F31.81 Lithium level    TSH    Comprehensive metabolic panel    2. Anxiety  F41.9     3. Restless leg  G25.81     4. 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 Encounter for long-term (current) use of medications  Z79.899 Lithium level    TSH    Comprehensive metabolic panel       Receiving Psychotherapy: No    RECOMMENDATIONS:  PDMP  reviewed. I provided 40 minutes of non face-to-face time during this encounter, including time spent before and after the visit in records review, medical decision making, and charting. We discussed the tremor, it is difficult to see on the video but it does not appear to be occurring at this moment.  Since the propranolol did help in the past right after we first started it I think it would be beneficial to increase the propranolol again.  Her last documented blood pressure in the chart was in March and it was 140/90.  Patient does check her blood pressure off and on and states it is never low.  She will continue to watch her blood pressure and pulse, if she feels dizzy, weak, faint then stop the propranolol and call. Increase Propranolol to 20 mg, 1 po bid.  Continue Li 750 mg qd.  Continue Synthroid 50 mcg daily. Continue Tranxene 3.75 mg 1 twice daily as needed. Continue Cymbalta 20 mg, 2 p.o. daily with 60 mg pills to equal 100 mg. Continue Cymbalta 60 mg, 1 p.o. daily with 40 mg to equal 100 mg. Continue fish oil, B complex and multivitamin. Draw labs as noted above. Return in 2 months preferably in the office so I can evaluate for abnormal movements.  Donnal Moat, PA-C

## 2021-04-25 ENCOUNTER — Encounter: Payer: Self-pay | Admitting: Physician Assistant

## 2021-05-11 ENCOUNTER — Other Ambulatory Visit: Payer: Self-pay | Admitting: Physician Assistant

## 2021-05-15 ENCOUNTER — Other Ambulatory Visit: Payer: Self-pay | Admitting: Physician Assistant

## 2021-05-18 DIAGNOSIS — R2689 Other abnormalities of gait and mobility: Secondary | ICD-10-CM | POA: Diagnosis not present

## 2021-05-18 DIAGNOSIS — M545 Low back pain, unspecified: Secondary | ICD-10-CM | POA: Diagnosis not present

## 2021-05-26 DIAGNOSIS — M545 Low back pain, unspecified: Secondary | ICD-10-CM | POA: Diagnosis not present

## 2021-05-26 DIAGNOSIS — R2689 Other abnormalities of gait and mobility: Secondary | ICD-10-CM | POA: Diagnosis not present

## 2021-06-03 ENCOUNTER — Other Ambulatory Visit: Payer: Self-pay | Admitting: Family Medicine

## 2021-06-03 DIAGNOSIS — I10 Essential (primary) hypertension: Secondary | ICD-10-CM

## 2021-06-05 DIAGNOSIS — R2689 Other abnormalities of gait and mobility: Secondary | ICD-10-CM | POA: Diagnosis not present

## 2021-06-05 DIAGNOSIS — M545 Low back pain, unspecified: Secondary | ICD-10-CM | POA: Diagnosis not present

## 2021-06-08 DIAGNOSIS — M545 Low back pain, unspecified: Secondary | ICD-10-CM | POA: Diagnosis not present

## 2021-06-08 DIAGNOSIS — R2689 Other abnormalities of gait and mobility: Secondary | ICD-10-CM | POA: Diagnosis not present

## 2021-06-12 ENCOUNTER — Other Ambulatory Visit: Payer: Self-pay | Admitting: Physician Assistant

## 2021-06-15 DIAGNOSIS — M545 Low back pain, unspecified: Secondary | ICD-10-CM | POA: Diagnosis not present

## 2021-06-15 DIAGNOSIS — R2689 Other abnormalities of gait and mobility: Secondary | ICD-10-CM | POA: Diagnosis not present

## 2021-06-18 ENCOUNTER — Other Ambulatory Visit: Payer: Self-pay | Admitting: Family Medicine

## 2021-06-20 ENCOUNTER — Ambulatory Visit: Payer: Medicare Other | Admitting: Physician Assistant

## 2021-06-20 ENCOUNTER — Telehealth: Payer: Self-pay | Admitting: Physician Assistant

## 2021-06-20 NOTE — Telephone Encounter (Signed)
Pt is not able to fall asleep until 5 am. She is also experiencing a lot of anxiety. Please call her with options to help her at [phone]

## 2021-06-20 NOTE — Telephone Encounter (Signed)
If she's not taking the Tranxene in the evening, have her take a pill about 1 hour before wanting to go to sleep. She's taken Gabapentin in the past, but told me at [place] in June she's not on it. Not sure why. It can help with anxiety, restlessness, and can cause drowsiness.  Recommend she start it back up. Take Gabapentin 300 mg 1-2 hours before needing to go to sleep. If she needs an Rx, let me know where to send it.

## 2021-06-20 NOTE — Telephone Encounter (Signed)
Pt stated she has problems going to sleep and staying asleep.She thinks it's because she has high anxiety at night and does not know why.

## 2021-06-21 ENCOUNTER — Other Ambulatory Visit: Payer: Self-pay | Admitting: Physician Assistant

## 2021-06-21 MED ORDER — CLORAZEPATE DIPOTASSIUM 3.75 MG PO TABS: 3.7500 mg | ORAL_TABLET | Freq: Two times a day (BID) | ORAL | 1 refills | Status: DC | PRN

## 2021-06-21 MED ORDER — GABAPENTIN 300 MG PO CAPS: 300.0000 mg | ORAL_CAPSULE | Freq: Every day | ORAL | 1 refills | Status: DC

## 2021-06-21 NOTE — Telephone Encounter (Signed)
Prescriptions were sent

## 2021-06-21 NOTE — Telephone Encounter (Signed)
She will need a refill for both sent to [facility] in [place]

## 2021-06-22 DIAGNOSIS — R2689 Other abnormalities of gait and mobility: Secondary | ICD-10-CM | POA: Diagnosis not present

## 2021-06-22 DIAGNOSIS — M545 Low back pain, unspecified: Secondary | ICD-10-CM | POA: Diagnosis not present

## 2021-06-29 DIAGNOSIS — M545 Low back pain, unspecified: Secondary | ICD-10-CM | POA: Diagnosis not present

## 2021-06-29 DIAGNOSIS — R2689 Other abnormalities of gait and mobility: Secondary | ICD-10-CM | POA: Diagnosis not present

## 2021-07-06 DIAGNOSIS — M545 Low back pain, unspecified: Secondary | ICD-10-CM | POA: Diagnosis not present

## 2021-07-06 DIAGNOSIS — R2689 Other abnormalities of gait and mobility: Secondary | ICD-10-CM | POA: Diagnosis not present

## 2021-07-10 ENCOUNTER — Telehealth: Payer: Self-pay | Admitting: Family Medicine

## 2021-07-10 NOTE — Telephone Encounter (Signed)
Patient states she believes there is something going on with her kidneys' her urine has been different colors and is not sure what's wrong. Her urine has been orange ranging from light to dark, she wants to know if she needs to come in to be seen or not. Offered to set up an appointment but she states she would like to know what Dr. Etter Sjogren wants her to do first. Please advice.

## 2021-07-10 NOTE — Telephone Encounter (Signed)
Pt needs OV please

## 2021-07-11 ENCOUNTER — Ambulatory Visit: Payer: Medicare Other | Admitting: Family Medicine

## 2021-07-11 ENCOUNTER — Other Ambulatory Visit: Payer: Self-pay | Admitting: Physician Assistant

## 2021-07-13 ENCOUNTER — Encounter: Payer: Self-pay | Admitting: Family Medicine

## 2021-07-13 ENCOUNTER — Other Ambulatory Visit: Payer: Self-pay

## 2021-07-13 ENCOUNTER — Telehealth: Payer: Self-pay

## 2021-07-13 ENCOUNTER — Ambulatory Visit (INDEPENDENT_AMBULATORY_CARE_PROVIDER_SITE_OTHER): Payer: Medicare Other | Admitting: Family Medicine

## 2021-07-13 VITALS — BP 130/80 | HR 66 | Temp 99.0°F | Resp 20 | Ht 62.25 in

## 2021-07-13 DIAGNOSIS — R1030 Lower abdominal pain, unspecified: Secondary | ICD-10-CM

## 2021-07-13 DIAGNOSIS — R109 Unspecified abdominal pain: Secondary | ICD-10-CM

## 2021-07-13 DIAGNOSIS — N6322 Unspecified lump in the left breast, upper inner quadrant: Secondary | ICD-10-CM | POA: Diagnosis not present

## 2021-07-13 DIAGNOSIS — R52 Pain, unspecified: Secondary | ICD-10-CM | POA: Diagnosis not present

## 2021-07-13 DIAGNOSIS — R110 Nausea: Secondary | ICD-10-CM | POA: Diagnosis not present

## 2021-07-13 DIAGNOSIS — Z23 Encounter for immunization: Secondary | ICD-10-CM | POA: Diagnosis not present

## 2021-07-13 DIAGNOSIS — M545 Low back pain, unspecified: Secondary | ICD-10-CM

## 2021-07-13 HISTORY — DX: Unspecified abdominal pain: R10.9

## 2021-07-13 HISTORY — DX: Unspecified lump in the left breast, upper inner quadrant: N63.22

## 2021-07-13 LAB — CBC WITH DIFFERENTIAL/PLATELET
Basophils Absolute: 0.1 10*3/uL (ref 0.0–0.1)
Basophils Relative: 1 % (ref 0.0–3.0)
Eosinophils Absolute: 0.3 10*3/uL (ref 0.0–0.7)
Eosinophils Relative: 4.6 % (ref 0.0–5.0)
HCT: 43.7 % (ref 36.0–46.0)
Hemoglobin: 14.3 g/dL (ref 12.0–15.0)
Lymphocytes Relative: 21.5 % (ref 12.0–46.0)
Lymphs Abs: 1.4 10*3/uL (ref 0.7–4.0)
MCHC: 32.8 g/dL (ref 30.0–36.0)
MCV: 95.3 fl (ref 78.0–100.0)
Monocytes Absolute: 0.6 10*3/uL (ref 0.1–1.0)
Monocytes Relative: 9.6 % (ref 3.0–12.0)
Neutro Abs: 4.2 10*3/uL (ref 1.4–7.7)
Neutrophils Relative %: 63.3 % (ref 43.0–77.0)
Platelets: 336 10*3/uL (ref 150.0–400.0)
RBC: 4.58 Mil/uL (ref 3.87–5.11)
RDW: 14.1 % (ref 11.5–15.5)
WBC: 6.6 10*3/uL (ref 4.0–10.5)

## 2021-07-13 LAB — POCT URINALYSIS DIPSTICK
Bilirubin, UA: NEGATIVE
Blood, UA: NEGATIVE
Glucose, UA: NEGATIVE
Ketones, UA: NEGATIVE
Nitrite, UA: NEGATIVE
Protein, UA: NEGATIVE
Spec Grav, UA: 1.02 (ref 1.010–1.025)
Urobilinogen, UA: 0.2 E.U./dL
pH, UA: 6.5 (ref 5.0–8.0)

## 2021-07-13 LAB — COMPREHENSIVE METABOLIC PANEL
ALT: 15 U/L (ref 0–35)
AST: 19 U/L (ref 0–37)
Albumin: 4.3 g/dL (ref 3.5–5.2)
Alkaline Phosphatase: 55 U/L (ref 39–117)
BUN: 8 mg/dL (ref 6–23)
CO2: 29 mEq/L (ref 19–32)
Calcium: 10.3 mg/dL (ref 8.4–10.5)
Chloride: 103 mEq/L (ref 96–112)
Creatinine, Ser: 0.87 mg/dL (ref 0.40–1.20)
GFR: 65.28 mL/min (ref >60.00)
Glucose, Bld: 200 mg/dL — ABNORMAL HIGH (ref 70–99)
Potassium: 4.6 mEq/L (ref 3.5–5.1)
Sodium: 140 mEq/L (ref 135–145)
Total Bilirubin: 0.3 mg/dL (ref 0.2–1.2)
Total Protein: 6.8 g/dL (ref 6.0–8.3)

## 2021-07-13 MED ORDER — CEPHALEXIN 500 MG PO CAPS: 500.0000 mg | ORAL_CAPSULE | Freq: Two times a day (BID) | ORAL | 0 refills | Status: DC

## 2021-07-13 MED ORDER — FAMOTIDINE 20 MG PO TABS: 20.0000 mg | ORAL_TABLET | Freq: Two times a day (BID) | ORAL | 1 refills | Status: DC

## 2021-07-13 NOTE — Assessment & Plan Note (Signed)
pepcid bid Let us know if this does not help--- ? UTI ua --- leuks-- culture pending

## 2021-07-13 NOTE — Patient Instructions (Signed)
Nausea, Adult Nausea is the feeling that you have an upset stomach or that you are about to vomit. Nausea on its own is not usually a serious concern, but it may be an early sign of a more serious medical problem. As nausea gets worse, it can lead to vomiting. If vomiting develops, or if you are not able to drink enough fluids, you are at risk of becoming dehydrated. Dehydration can make you tired and thirsty, cause you to have a dry mouth, and decrease how often you urinate. Older adults and people with other diseases or a weak disease-fighting system (immune system) are at higher risk for dehydration. The main goals of treating your nausea are: To relieve your nausea. To limit repeated nausea episodes. To prevent vomiting and dehydration. Follow these instructions at home: Watch your symptoms for any changes. Tell your health care provider about them. Follow these instructions as told by your health care provider. Eating and drinking   Take an oral rehydration solution (ORS). This is a drink that is sold at pharmacies and retail stores. Drink clear fluids slowly and in small amounts as you are able. Clear fluids include water, ice chips, low-calorie sports drinks, and fruit juice that has water added (diluted fruit juice). Eat bland, easy-to-digest foods in small amounts as you are able. These foods include bananas, applesauce, rice, lean meats, toast, and crackers. Avoid drinking fluids that contain a lot of sugar or caffeine, such as energy drinks, sports drinks, and soda. Avoid alcohol. Avoid spicy or fatty foods. General instructions Take over-the-counter and prescription medicines only as told by your health care provider. Rest at home while you recover. Drink enough fluid to keep your urine pale yellow. Breathe slowly and deeply when you feel nauseous. Avoid smelling things that have strong odors. Wash your hands often using soap and water. If soap and water are not available, use hand  sanitizer. Make sure that all people in your household wash their hands well and often. Keep all follow-up visits as told by your health care provider. This is important. Contact a health care provider if: Your nausea gets worse. Your nausea does not go away after two days. You vomit. You cannot drink fluids without vomiting. You have any of the following: New symptoms. A fever. A headache. Muscle cramps. A rash. Pain while urinating. You feel light-headed or dizzy. Get help right away if: You have pain in your chest, neck, arm, or jaw. You feel extremely weak or you faint. You have vomit that is bright red or looks like coffee grounds. You have bloody or black stools or stools that look like tar. You have a severe headache, a stiff neck, or both. You have severe pain, cramping, or bloating in your abdomen. You have difficulty breathing or are breathing very quickly. Your heart is beating very quickly. Your skin feels cold and clammy. You feel confused. You have signs of dehydration, such as: Dark urine, very little urine, or no urine. Cracked lips. Dry mouth. Sunken eyes. Sleepiness. Weakness. These symptoms may represent a serious problem that is an emergency. Do not wait to see if the symptoms will go away. Get medical help right away. Call your local emergency services (911 in the [place] Do not drive yourself to the hospital. Summary Nausea is the feeling that you have an upset stomach or that you are about to vomit. Nausea on its own is not usually a serious concern, but it may be an early sign of a more  serious medical problem. If vomiting develops, or if you are not able to drink enough fluids, you are at risk of becoming dehydrated. Follow recommendations for eating and drinking and take over-the-counter and prescription medicines only as told by your health care provider. Contact a health care provider right away if your symptoms worsen or you have new symptoms. Keep  all follow-up visits as told by your health care provider. This is important. This information is not intended to replace advice given to you by your health care provider. Make sure you discuss any questions you have with your health care provider. Document Revised: 01/18/2021 Document Reviewed: 04/08/2018 Elsevier Patient Education  [place].

## 2021-07-13 NOTE — Assessment & Plan Note (Signed)
Pt does have hx kidney stones  Pt will strain urine and let us know if pain worsens and we will get ct

## 2021-07-13 NOTE — Telephone Encounter (Signed)
Attempted to contact patient, LM requesting call back.    [facility] Night - Client TELEPHONE ADVICE RECORD Access Nurse Patient Name:Megan Duncan Gender: Female DOB: 07/28/1946 Age: 75 Y 2 M 8 D Return Phone [phone]) [employer] [facility] - Client Client Site [facility] - Night Physician Roma Schanz- MD Contact Type Call Who Is Calling Patient / Member / Family / Caregiver Call Type Triage / Clinical Relationship To Patient Self Return Phone Number [phone] (Primary) Chief Complaint Nausea Reason for Call Request to [facility] Appointment Initial Comment Caller states for the last few days she has been experiencing nausea and her symptoms are getting worse. Caller states she needs to cancel her appointment, but she would like a medication. Translation No Nurse Assessment Nurse: Windle Guard, RN, Olin Hauser Date/Time (Eastern Time): 07/13/2021 7:29:11 AM Confirm and document reason for call. If symptomatic, describe symptoms. ---Caller states she has nausea which is getting worse. States she has an appointment for this morning, but needs to cancel. States nausea started 2-3 days ago. Denies vomiting, diarrhea or abdominal pain. Does the patient have any new or worsening symptoms? ---Yes Will a triage be completed? ---Yes Related visit to physician within the last 2 weeks? ---No Does the PT have any chronic conditions? (i.e. diabetes, asthma, this includes High risk factors for pregnancy, etc.) ---Yes List chronic conditions. ---Chronic back pain, Diabetic, HTN Is this a behavioral health or substance abuse call? ---No Guidelines Guideline Title Affirmed Question Affirmed Notes Nurse Date/Time (Eastern Time) Nausea Taking any of the following medications: digoxin (Lanoxin), lithium, theophylline, phenytoin (Dilantin) Conner, RN, Olin Hauser 07/13/2021 7:35:04 AM PLEASE NOTE: All  timestamps contained within this report are represented as [place] Standard Time. CONFIDENTIALTY NOTICE: This fax transmission is intended only for the addressee. It contains information that is legally privileged, confidential or otherwise protected from use or disclosure. If you are not the intended recipient, you are strictly prohibited from reviewing, disclosing, copying using or disseminating any of this information or taking any action in reliance on or regarding this information. If you have received this fax in error, please notify us immediately by telephone so that we can arrange for its return to [name]. Phone: [phone], Toll-Free: [phone], Fax: [phone] Page: 2 of 2 Call Id: [id number] [place]. Time Eilene Ghazi Time) Disposition Final User 07/13/2021 7:39:19 AM See HCP within 4 Hours (or PCP triage) Yes Conner, RN, Otho Najjar Disagree/Comply Comply Caller Understands Yes PreDisposition Call Doctor Care Advice Given Per Guideline SEE HCP (OR PCP TRIAGE) WITHIN 4 HOURS: * IF OFFICE WILL BE OPEN: You need to be seen within the next 3 or 4 hours. Call your doctor (or NP/PA) now or as soon as the office opens. BRING MEDICINES: * Please bring a list of your current medicines when you go to see the doctor. CALL BACK IF: * You become worse CARE ADVICE given per Nausea (Adult) guideline. Comments User: Harvin Hazel, RN Date/Time Eilene Ghazi Time): 07/13/2021 7:35:36 AM Had stomach pain when she woke up but this has improved. Referrals REFERRED TO PCP OFFICE

## 2021-07-13 NOTE — Assessment & Plan Note (Signed)
With abn urine Keflex sent in and culture is pending  rto if symptoms do not improve

## 2021-07-13 NOTE — Telephone Encounter (Signed)
Patient returned call, spoke with Megan Duncan.  Patient states she will come in person for visit at 10am.

## 2021-07-13 NOTE — Progress Notes (Addendum)
Subjective:   By signing my name below, I, Megan Duncan, attest that this documentation has been prepared under the direction and in the presence of Dr. Roma Schanz, DO. 07/13/2021     Patient ID: Megan Duncan, female    DOB: [birth year], 75 y.o.   MRN: [id number]  Chief Complaint  Patient presents with   Back Pain    Lower left back pain and state pain come across stomach. Pt states not freq or urgency. Pt states some nausea.     Back Pain Pertinent negatives include no abdominal pain, chest pain, dysuria, fever or headaches.  Patient is in today for a office visit.   She complains of having nausea. Her appetite has decreased and she has mild insomnia due to the nausea. She does not take any medication to manage her nausea at this time.  She also complains of sore pain in her lower left back that radiates to her left flank. She has no recent episode of passing a kidney stone.  She is continues going to water therapy to manage her back pain and reports having improvement.  She also reports finding a hard spot above her left breast. She has not had her mammogram completed recently. She is interested in setting up and appointment.  She is interested in getting a flu vaccine during this visit.   Past Medical History:  Diagnosis Date   Anxiety    Arthritis    hands   Bipolar II disorder (HCC)    Dr. Charlott Holler   COPD (chronic obstructive pulmonary disease) ([place])    Depression    Diastolic dysfunction    Per pt, diagnosed after [facility]   Diverticulosis    Fibromyalgia    Hyperlipidemia    Hypertension    LBP (low back pain)     Past Surgical History:  Procedure Laterality Date   APPENDECTOMY     KNEE ARTHROSCOPY Right 2008   TONSILLECTOMY     TOTAL ABDOMINAL HYSTERECTOMY      Family History  Problem Relation Age of Onset   Colon cancer Maternal Aunt 14   Diabetes Father    Hypertension Father    Heart attack Father    Alcoholism Father    Hypertension  Mother    Hiatal hernia Mother    Mental illness Sister        suicide   Breast cancer Maternal Aunt     Social History   Socioeconomic History   Marital status: Married    Spouse name: Not on file   Number of children: 3   Years of education: Not on file   Highest education level: Not on file  Occupational History   Occupation: retired  Tobacco Use   Smoking status: Former    Packs/day: 1.50    Years: 40.00    Pack years: 60.00    Types: Cigarettes    Quit date: 07/02/2004    Years since quitting: 17.0   Smokeless tobacco: Never  Substance and Sexual Activity   Alcohol use: Yes    Alcohol/week: 0.0 - 1.0 standard drinks    Comment: occasional wine   Drug use: No   Sexual activity: Never  Other Topics Concern   Not on file  Social History Narrative   Lives with husband and grandson she is raising.   Social Determinants of Health   Financial Resource Strain: Not on file  Food Insecurity: Not on file  Transportation Needs: Not on file  Physical Activity: Not  on file  Stress: Not on file  Social Connections: Not on file  Intimate Partner Violence: Not on file    Outpatient Medications Prior to Visit  Medication Sig Dispense Refill   albuterol (VENTOLIN HFA) 108 (90 Base) MCG/ACT inhaler Inhale 2 puffs into the lungs every 4 (four) hours as needed. For shortness of breath and wheezing 18 g 3   amLODipine (NORVASC) 5 MG tablet TAKE 1 TABLET (5 MG TOTAL) BY MOUTH DAILY. 90 tablet 1   blood glucose meter kit and supplies Dispense based on patient and insurance preference. Use once a day.  DX Code: E11.9 1 each 0   budesonide-formoterol (SYMBICORT) 80-4.5 MCG/ACT inhaler Inhale 2 puffs into the lungs 2 (two) times daily. 1 Inhaler 0   clorazepate (TRANXENE) 3.75 MG tablet Take 1 tablet (3.75 mg total) by mouth 2 (two) times daily as needed for anxiety or sleep. 60 tablet 1   cyclobenzaprine (FLEXERIL) 10 MG tablet Take 1 tablet (10 mg total) by mouth 3 (three) times  daily as needed. For muscle spasms 30 tablet 0   DULoxetine (CYMBALTA) 20 MG capsule TAKE 2 CAPSULES BY MOUTH DAILY TAKE WITH 60 MG 180 capsule 1   DULoxetine (CYMBALTA) 60 MG capsule TAKE 1 CAPSULE BY MOUTH EVERY MORNING 90 capsule 1   gabapentin (NEURONTIN) 300 MG capsule Take 1 capsule (300 mg total) by mouth at bedtime. 30 capsule 1   glucose blood test strip Use as instructed once a day.  Dx Code: E11.9 30 each 2   hydrocortisone-pramoxine (ANALPRAM HC) 2.5-1 % rectal cream Place 1 application rectally 3 (three) times daily. 30 g 3   Lancets MISC Use as directed once a day.  Dx code: E11.9 30 each 2   levothyroxine (SYNTHROID) 50 MCG tablet TAKE 1 TABLET BY MOUTH DAILY BEFORE BREAKFAST 90 tablet 1   lithium carbonate 150 MG capsule TAKE 1 CAPSULE (150 MG TOTAL) BY MOUTH DAILY, TAKE  WITH LITHIUM 600 MG  TO EQUAL 750 MG TOTAL 90 capsule 1   lithium carbonate 300 MG capsule TAKE 2 CAPSULES (600 MG) BY MOUTH AT BEDTIME, WITH 150 MG CAPSULE 180 capsule 0   metFORMIN (GLUCOPHAGE) 500 MG tablet TAKE 1 TABLET BY MOUTH 2 TIMES DAILY WITH A MEAL. 180 tablet 1   nystatin cream (MYCOSTATIN) Apply 1 application topically 2 (two) times daily. 30 g 0   omega-3 acid ethyl esters (LOVAZA) 1 g capsule Take by mouth 2 (two) times daily.     propranolol (INDERAL) 20 MG tablet TAKE 1 TABLET BY MOUTH TWICE A DAY 60 tablet 0   tiZANidine (ZANAFLEX) 4 MG capsule      topiramate (TOPAMAX) 25 MG tablet Take 1 tablet (25 mg total) by mouth 2 (two) times daily. 60 tablet 1   traMADol (ULTRAM) 50 MG tablet 1-2 po q6h prn 60 tablet 1   vitamin B-12 (CYANOCOBALAMIN) 100 MCG tablet Take 100 mcg by mouth daily.     No facility-administered medications prior to visit.    Allergies  Allergen Reactions   Atorvastatin Other (See Comments)    Significant rise in liver tests   Codeine Nausea And Vomiting   Diclofenac Other (See Comments)    Elevated LFT's   Doxycycline     Extreme nausea   Sulfonamide Derivatives  Swelling    Review of Systems  Constitutional:  Negative for fever and malaise/fatigue.       (+)decreased appetite due to nausea  HENT:  Negative for congestion.  Eyes:  Negative for blurred vision.  Respiratory:  Negative for shortness of breath.   Cardiovascular:  Negative for chest pain, palpitations and leg swelling.  Gastrointestinal:  Positive for nausea. Negative for abdominal pain and blood in stool.  Genitourinary:  Positive for flank pain (Left). Negative for dysuria and frequency.  Musculoskeletal:  Positive for back pain (Left lower back). Negative for falls.       (+)hardened node above left breast  Skin:  Negative for rash.  Neurological:  Negative for dizziness, loss of consciousness and headaches.  Endo/Heme/Allergies:  Negative for environmental allergies.  Psychiatric/Behavioral:  Negative for depression. The patient has insomnia (due to nausea). The patient is not nervous/anxious.       Objective:    Physical Exam Vitals and nursing note reviewed.  Constitutional:      General: She is not in acute distress.    Appearance: Normal appearance. She is not ill-appearing.  HENT:     Head: Normocephalic and atraumatic.     Right Ear: External ear normal.     Left Ear: External ear normal.  Eyes:     Extraocular Movements: Extraocular movements intact.     Pupils: Pupils are equal, round, and reactive to light.  Cardiovascular:     Rate and Rhythm: Normal rate and regular rhythm.     Heart sounds: Normal heart sounds. No murmur heard.   No gallop.  Pulmonary:     Effort: Pulmonary effort is normal. No respiratory distress.     Breath sounds: Normal breath sounds. No wheezing or rales.  Chest:     Comments: Mass noted 11 o'clock above left breast that is tender to palpation. Abdominal:     Palpations: Abdomen is soft.     Tenderness: There is abdominal tenderness in the epigastric area.     Comments: Left flank pain  Skin:    General: Skin is warm and dry.   Neurological:     Mental Status: She is alert and oriented to person, place, and time.  Psychiatric:        Behavior: Behavior normal.    BP 130/80 (BP Location: Left Arm, Patient Position: Sitting, Cuff Size: Large)   Pulse 66   Temp 99 F (37.2 C) (Oral)   Resp 20   Ht 5' 2.25" (1.581 m)   SpO2 95%   BMI 36.21 kg/m  Wt Readings from Last 3 Encounters:  01/31/21 199 lb 9.6 oz (90.5 kg)  09/22/20 197 lb (89.4 kg)  05/25/20 189 lb 3.2 oz (85.8 kg)    Diabetic Foot Exam - Simple   No data filed    Lab Results  Component Value Date   WBC 6.6 07/13/2021   HGB 14.3 07/13/2021   HCT 43.7 07/13/2021   PLT 336.0 07/13/2021   GLUCOSE 200 (H) 07/13/2021   CHOL 201 (H) 09/22/2020   TRIG 151.0 (H) 09/22/2020   HDL 72.60 09/22/2020   LDLDIRECT 120.1 12/03/2013   LDLCALC 99 09/22/2020   ALT 15 07/13/2021   AST 19 07/13/2021   NA 140 07/13/2021   K 4.6 07/13/2021   CL 103 07/13/2021   CREATININE 0.87 07/13/2021   BUN 8 07/13/2021   CO2 29 07/13/2021   TSH 5.46 (H) 09/22/2020   INR 1.0 10/25/2015   HGBA1C 6.1 09/22/2020   MICROALBUR 2.7 (H) 09/22/2020    Lab Results  Component Value Date   TSH 5.46 (H) 09/22/2020   Lab Results  Component Value Date   WBC 6.6  07/13/2021   HGB 14.3 07/13/2021   HCT 43.7 07/13/2021   MCV 95.3 07/13/2021   PLT 336.0 07/13/2021   Lab Results  Component Value Date   NA 140 07/13/2021   K 4.6 07/13/2021   CO2 29 07/13/2021   GLUCOSE 200 (H) 07/13/2021   BUN 8 07/13/2021   CREATININE 0.87 07/13/2021   BILITOT 0.3 07/13/2021   ALKPHOS 55 07/13/2021   AST 19 07/13/2021   ALT 15 07/13/2021   PROT 6.8 07/13/2021   ALBUMIN 4.3 07/13/2021   CALCIUM 10.3 07/13/2021   GFR 65.28 07/13/2021   Lab Results  Component Value Date   CHOL 201 (H) 09/22/2020   Lab Results  Component Value Date   HDL 72.60 09/22/2020   Lab Results  Component Value Date   LDLCALC 99 09/22/2020   Lab Results  Component Value Date   TRIG 151.0 (H)  09/22/2020   Lab Results  Component Value Date   CHOLHDL 3 09/22/2020   Lab Results  Component Value Date   HGBA1C 6.1 09/22/2020       Assessment & Plan:   Problem List Items Addressed This Visit       Unprioritized   BACK PAIN, LUMBAR - Primary   Relevant Orders   POCT Urinalysis Dipstick (Completed)   Urine Culture   Left flank pain    Pt does have hx kidney stones  Pt will strain urine and let us know if pain worsens and we will get ct      Relevant Medications   cephALEXin (KEFLEX) 500 MG capsule   Other Relevant Orders   CBC with Differential/Platelet (Completed)   Comprehensive metabolic panel (Completed)   Lower abdominal pain    With abn urine Keflex sent in and culture is pending  rto if symptoms do not improve      Lump in upper inner quadrant of left breast   Relevant Orders   MM Digital Diagnostic Bilat   US BREAST LTD UNI LEFT INC AXILLA   Nausea    pepcid bid Let us know if this does not help--- ? UTI ua --- leuks-- culture pending       Relevant Medications   famotidine (PEPCID) 20 MG tablet   Other Visit Diagnoses     Pain, unspecified        Relevant Orders   Urine Culture   Need for influenza vaccination       Relevant Orders   Flu Vaccine QUAD High Dose(Fluad)        Meds ordered this encounter  Medications   cephALEXin (KEFLEX) 500 MG capsule    Sig: Take 1 capsule (500 mg total) by mouth 2 (two) times daily.    Dispense:  20 capsule    Refill:  0   famotidine (PEPCID) 20 MG tablet    Sig: Take 1 tablet (20 mg total) by mouth 2 (two) times daily.    Dispense:  60 tablet    Refill:  1    I, Dr. Roma Schanz, DO, personally preformed the services described in this documentation.  All medical record entries made by the scribe were at my direction and in my presence.  I have reviewed the chart and discharge instructions (if applicable) and agree that the record reflects my personal performance and is accurate and  complete. 06/12/2021   I,Megan Duncan,acting as a scribe for Ann Held, DO.,have documented all relevant documentation on the behalf of Ann Held, DO,as directed  by  Ann Held, DO while in the presence of Ann Held, DO.   Ann Held, DO

## 2021-07-14 LAB — URINE CULTURE
MICRO NUMBER:: 12321569
Result:: NO GROWTH
SPECIMEN QUALITY:: ADEQUATE

## 2021-07-17 ENCOUNTER — Other Ambulatory Visit: Payer: Self-pay | Admitting: Family Medicine

## 2021-07-17 DIAGNOSIS — E1165 Type 2 diabetes mellitus with hyperglycemia: Secondary | ICD-10-CM

## 2021-07-18 ENCOUNTER — Other Ambulatory Visit (INDEPENDENT_AMBULATORY_CARE_PROVIDER_SITE_OTHER): Payer: Medicare Other

## 2021-07-18 DIAGNOSIS — E1165 Type 2 diabetes mellitus with hyperglycemia: Secondary | ICD-10-CM

## 2021-07-18 LAB — HEMOGLOBIN A1C: Hgb A1c MFr Bld: 7.2 % — ABNORMAL HIGH (ref 4.6–6.5)

## 2021-07-19 ENCOUNTER — Other Ambulatory Visit: Payer: Self-pay | Admitting: Family Medicine

## 2021-07-19 DIAGNOSIS — E1169 Type 2 diabetes mellitus with other specified complication: Secondary | ICD-10-CM

## 2021-07-19 DIAGNOSIS — E1165 Type 2 diabetes mellitus with hyperglycemia: Secondary | ICD-10-CM

## 2021-07-21 MED ORDER — METFORMIN HCL 1000 MG PO TABS: 1000.0000 mg | ORAL_TABLET | Freq: Two times a day (BID) | ORAL | 2 refills | Status: DC

## 2021-07-28 ENCOUNTER — Telehealth: Payer: Self-pay | Admitting: Family Medicine

## 2021-07-28 NOTE — Telephone Encounter (Signed)
Patient is calling regarding her breast appointment on Oct 13th for her mammogram exam. She states that her pain is getting worse and she wants to see if she can get her breast exam done sooner at another place. Please advice.

## 2021-07-30 ENCOUNTER — Ambulatory Visit: Payer: Medicare Other

## 2021-07-31 NOTE — Telephone Encounter (Signed)
Please advise- do you know of any other location that does diagnostic mammograms and ultrasounds?

## 2021-08-02 ENCOUNTER — Other Ambulatory Visit: Payer: Self-pay | Admitting: Physician Assistant

## 2021-08-02 NOTE — Telephone Encounter (Signed)
Attempted to call patient to see how pain was. No answer. VM was full

## 2021-08-07 ENCOUNTER — Other Ambulatory Visit: Payer: Self-pay | Admitting: Family Medicine

## 2021-08-07 ENCOUNTER — Other Ambulatory Visit: Payer: Self-pay | Admitting: Physician Assistant

## 2021-08-07 DIAGNOSIS — R110 Nausea: Secondary | ICD-10-CM

## 2021-08-09 ENCOUNTER — Ambulatory Visit: Payer: Medicare Other | Admitting: Physician Assistant

## 2021-08-15 ENCOUNTER — Telehealth: Payer: Self-pay

## 2021-08-15 ENCOUNTER — Ambulatory Visit (INDEPENDENT_AMBULATORY_CARE_PROVIDER_SITE_OTHER): Payer: Medicare Other

## 2021-08-15 ENCOUNTER — Ambulatory Visit: Payer: Medicare Other

## 2021-08-15 VITALS — Ht 62.25 in | Wt 199.0 lb

## 2021-08-15 DIAGNOSIS — Z780 Asymptomatic menopausal state: Secondary | ICD-10-CM | POA: Diagnosis not present

## 2021-08-15 DIAGNOSIS — Z0000 Encounter for general adult medical examination without abnormal findings: Secondary | ICD-10-CM | POA: Diagnosis not present

## 2021-08-15 NOTE — Progress Notes (Deleted)
Subjective:   Megan Duncan is a 75 y.o. female who presents for Medicare Annual (Subsequent) preventive examination.  I connected with *** today by telephone and verified that I am speaking with the correct person using two identifiers. Location patient: home Location provider: work Persons participating in the virtual visit: patient, Marine scientist.    I discussed the limitations, risks, security and privacy concerns of performing an evaluation and management service by telephone and the availability of in person appointments. I also discussed with the patient that there may be a patient responsible charge related to this service. The patient expressed understanding and verbally consented to this telephonic visit.    Interactive audio and video telecommunications were attempted between this provider and patient, however failed, due to patient having technical difficulties OR patient did not have access to video capability.  We continued and completed visit with audio only.  Some vital signs may be absent or patient reported.   Time Spent with patient on telephone encounter: *** minutes   Review of Systems    ***       Objective:    There were no vitals filed for this visit. There is no height or weight on file to calculate BMI.  Advanced Directives 11/16/2019 06/19/2019 06/17/2018 01/10/2017 04/07/2015  Does Patient Have a Medical Advance Directive? No Yes No No Yes  Type of Advance Directive - [place];Living will - - [place];Living will  Does patient want to make changes to medical advance directive? - No - Patient declined - - No - Patient declined  Copy of [place] in Chart? - No - copy requested - - No - copy requested  Would patient like information on creating a medical advance directive? No - Patient declined - No - Patient declined No - Patient declined -    Current Medications (verified) Outpatient Encounter  Medications as of 08/15/2021  Medication Sig   albuterol (VENTOLIN HFA) 108 (90 Base) MCG/ACT inhaler Inhale 2 puffs into the lungs every 4 (four) hours as needed. For shortness of breath and wheezing   amLODipine (NORVASC) 5 MG tablet TAKE 1 TABLET (5 MG TOTAL) BY MOUTH DAILY.   blood glucose meter kit and supplies Dispense based on patient and insurance preference. Use once a day.  DX Code: E11.9   budesonide-formoterol (SYMBICORT) 80-4.5 MCG/ACT inhaler Inhale 2 puffs into the lungs 2 (two) times daily.   cephALEXin (KEFLEX) 500 MG capsule Take 1 capsule (500 mg total) by mouth 2 (two) times daily.   clorazepate (TRANXENE) 3.75 MG tablet Take 1 tablet (3.75 mg total) by mouth 2 (two) times daily as needed for anxiety or sleep.   cyclobenzaprine (FLEXERIL) 10 MG tablet Take 1 tablet (10 mg total) by mouth 3 (three) times daily as needed. For muscle spasms   DULoxetine (CYMBALTA) 20 MG capsule TAKE 2 CAPSULES BY MOUTH DAILY TAKE WITH 60 MG   DULoxetine (CYMBALTA) 60 MG capsule TAKE 1 CAPSULE BY MOUTH EVERY MORNING   famotidine (PEPCID) 20 MG tablet Take 1 tablet (20 mg total) by mouth 2 (two) times daily.   gabapentin (NEURONTIN) 300 MG capsule Take 1 capsule (300 mg total) by mouth at bedtime.   glucose blood test strip Use as instructed once a day.  Dx Code: E11.9   hydrocortisone-pramoxine (ANALPRAM HC) 2.5-1 % rectal cream Place 1 application rectally 3 (three) times daily.   Lancets MISC Use as directed once a day.  Dx code: E11.9  levothyroxine (SYNTHROID) 50 MCG tablet TAKE 1 TABLET BY MOUTH DAILY BEFORE BREAKFAST   lithium carbonate 150 MG capsule TAKE 1 CAPSULE (150 MG TOTAL) BY MOUTH DAILY, TAKE WITH LITHIUM 600 MG TO EQUAL 750 MG TOTAL   lithium carbonate 300 MG capsule TAKE 2 CAPSULES (600 MG) BY MOUTH AT BEDTIME, WITH 150 MG CAPSULE   metFORMIN (GLUCOPHAGE) 1000 MG tablet Take 1 tablet (1,000 mg total) by mouth 2 (two) times daily with a meal.   metFORMIN (GLUCOPHAGE) 500 MG tablet  TAKE 1 TABLET BY MOUTH 2 TIMES DAILY WITH A MEAL.   nystatin cream (MYCOSTATIN) Apply 1 application topically 2 (two) times daily.   omega-3 acid ethyl esters (LOVAZA) 1 g capsule Take by mouth 2 (two) times daily.   propranolol (INDERAL) 20 MG tablet TAKE 1 TABLET BY MOUTH TWICE A DAY   tiZANidine (ZANAFLEX) 4 MG capsule    topiramate (TOPAMAX) 25 MG tablet Take 1 tablet (25 mg total) by mouth 2 (two) times daily.   traMADol (ULTRAM) 50 MG tablet 1-2 po q6h prn   vitamin B-12 (CYANOCOBALAMIN) 100 MCG tablet Take 100 mcg by mouth daily.   No facility-administered encounter medications on file as of 08/15/2021.    Allergies (verified) Atorvastatin, Codeine, Diclofenac, Doxycycline, and Sulfonamide derivatives   History: Past Medical History:  Diagnosis Date   Anxiety    Arthritis    hands   Bipolar II disorder (HCC)    Dr. Charlott Holler   COPD (chronic obstructive pulmonary disease) ([place])    Depression    Diastolic dysfunction    Per pt, diagnosed after [facility]   Diverticulosis    Fibromyalgia    Hyperlipidemia    Hypertension    LBP (low back pain)    Past Surgical History:  Procedure Laterality Date   APPENDECTOMY     KNEE ARTHROSCOPY Right 2008   TONSILLECTOMY     TOTAL ABDOMINAL HYSTERECTOMY     Family History  Problem Relation Age of Onset   Colon cancer Maternal Aunt 31   Diabetes Father    Hypertension Father    Heart attack Father    Alcoholism Father    Hypertension Mother    Hiatal hernia Mother    Mental illness Sister        suicide   Breast cancer Maternal Aunt    Social History   Socioeconomic History   Marital status: Married    Spouse name: Not on file   Number of children: 3   Years of education: Not on file   Highest education level: Not on file  Occupational History   Occupation: retired  Tobacco Use   Smoking status: Former    Packs/day: 1.50    Years: 40.00    Pack years: 60.00    Types: Cigarettes    Quit date: 07/02/2004    Years since  quitting: 17.1   Smokeless tobacco: Never  Substance and Sexual Activity   Alcohol use: Yes    Alcohol/week: 0.0 - 1.0 standard drinks    Comment: occasional wine   Drug use: No   Sexual activity: Never  Other Topics Concern   Not on file  Social History Narrative   Lives with husband and grandson she is raising.   Social Determinants of Health   Financial Resource Strain: Not on file  Food Insecurity: Not on file  Transportation Needs: Not on file  Physical Activity: Not on file  Stress: Not on file  Social Connections: Not on file  Tobacco Counseling Counseling given: Not Answered   Clinical Intake:                Diabetes:  Is the patient diabetic?  Yes  If diabetic, was a CBG obtained today?  No  Did the patient bring in their glucometer from home?  {YES/NO:21197} How often do you monitor your CBG's? ***.   Financial Strains and Diabetes Management:  Are you having any financial strains with the device, your supplies or your medication? {YES/NO:21197}.  Does the patient want to be seen by Chronic Care Management for management of their diabetes?  {YES/NO:21197} Would the patient like to be referred to a Nutritionist or for Diabetic Management?  {YES/NO:21197}  Diabetic Exams:  Diabetic Eye Exam: Completed 01/19/2021.   Diabetic Foot Exam: Completed 09/22/2020.           Activities of Daily Living In your present state of health, do you have any difficulty performing the following activities: 09/22/2020  Hearing? Y  Vision? N  Difficulty concentrating or making decisions? N  Walking or climbing stairs? Y  Dressing or bathing? Y  Doing errands, shopping? N  Some recent data might be hidden    Patient Care Team: Carollee Herter, Alferd Apa, DO as PCP - General (Family Medicine) Luz Brazen as Physician Assistant (Physician Assistant) Milus Banister, MD as Attending Physician (Gastroenterology) Sherlynn Stalls, MD as Consulting  Physician (Ophthalmology)  Indicate any recent Medical Services you may have received from other than [facility] providers in the past year (date may be approximate).     Assessment:   This is a routine wellness examination for Ardie.  Hearing/Vision screen No results found.  Dietary issues and exercise activities discussed:     Goals Addressed   None    Depression Screen PHQ 2/9 Scores 11/16/2019 06/19/2019 06/17/2018 01/10/2017 10/18/2015 08/01/2015 07/15/2014  PHQ - 2 Score 0 0 1 6 0 0 6  PHQ- 9 Score - - - 16 - - 16  Exception Documentation - - - - Patient refusal - -    Fall Risk Fall Risk  05/25/2020 11/16/2019 06/19/2019 06/17/2018 06/13/2018  Falls in the past year? 1 0 1 No Yes  Comment - - - - Emmi Telephone Survey: data to providers prior to load  Number falls in past yr: 0 - 0 - 1  Comment - - - - Emmi Telephone Survey Actual Response = 1  Injury with Fall? 0 - 0 - No  Follow up Falls evaluation completed - Education provided;Falls prevention discussed - -    FALL RISK PREVENTION PERTAINING TO THE HOME:  Any stairs in or around the home? {YES/NO:21197} If so, are there any without handrails? {YES/NO:21197} Home free of loose throw rugs in walkways, pet beds, electrical cords, etc? {YES/NO:21197} Adequate lighting in your home to reduce risk of falls? {YES/NO:21197}  ASSISTIVE DEVICES UTILIZED TO PREVENT FALLS:  Life alert? {YES/NO:21197} Use of a cane, walker or w/c? {YES/NO:21197} Grab bars in the bathroom? {YES/NO:21197} Shower chair or bench in shower? {YES/NO:21197} Elevated toilet seat or a handicapped toilet? {YES/NO:21197}  TIMED UP AND GO:  Was the test performed? {YES/NO:21197}.  Length of time to ambulate 10 feet: *** sec.   {Appearance of Gait:2101803}  Cognitive Function: MMSE - Mini Mental State Exam 01/10/2017  Orientation to time 5  Orientation to Place 5  Registration 3  Attention/ Calculation 4  Recall 3  Language- name 2 objects 2  Language- repeat  1  Language- follow  3 step command 3  Language- read & follow direction 1  Write a sentence 1  Copy design 1  Total score 29        Immunizations Immunization History  Administered Date(s) Administered   Fluad Quad(high Dose 65+) 07/13/2021   Influenza Whole 08/17/2009, 08/09/2010, 09/24/2012, 08/11/2013   Influenza, High Dose Seasonal PF 09/12/2016, 08/30/2017, 09/23/2018   Influenza,inj,Quad PF,6+ Mos 07/15/2014, 08/01/2015   Influenza-Unspecified 09/21/2020   PFIZER(Purple Top)SARS-COV-2 Vaccination 12/24/2019, 01/18/2020, 09/21/2020   Pneumococcal Conjugate-13 07/15/2014   Pneumococcal Polysaccharide-23 08/01/2015   Zoster, Live 09/24/2012    TDAP status: Due, Education has been provided regarding the importance of this vaccine. Advised may receive this vaccine at local pharmacy or Health Dept. Aware to provide a copy of the vaccination record if obtained from local pharmacy or Health Dept. Verbalized acceptance and understanding.  Flu Vaccine status: Up to date  Pneumococcal vaccine status: Up to date  {Covid-19 vaccine status:2101808}  Qualifies for Shingles Vaccine? Yes   Zostavax completed Yes   Shingrix Completed?: No.    Education has been provided regarding the importance of this vaccine. Patient has been advised to call insurance company to determine out of pocket expense if they have not yet received this vaccine. Advised may also receive vaccine at local pharmacy or Health Dept. Verbalized acceptance and understanding.  Screening Tests Health Maintenance  Topic Date Due   TETANUS/TDAP  Never done   Zoster Vaccines- Shingrix (1 of 2) Never done   COLONOSCOPY (Pts 45-71yr Insurance coverage will need to be confirmed)  07/17/2015   MAMMOGRAM  07/02/2019   COVID-19 Vaccine (4 - Booster for Pfizer series) 12/14/2020   URINE MICROALBUMIN  09/22/2021   FOOT EXAM  09/22/2021   HEMOGLOBIN A1C  01/15/2022   OPHTHALMOLOGY EXAM  01/19/2022   INFLUENZA VACCINE   Completed   DEXA SCAN  Completed   Hepatitis C Screening  Completed   HPV VACCINES  Aged Out    Health Maintenance  Health Maintenance Due  Topic Date Due   TETANUS/TDAP  Never done   Zoster Vaccines- Shingrix (1 of 2) Never done   COLONOSCOPY (Pts 45-445yrInsurance coverage will need to be confirmed)  07/17/2015   MAMMOGRAM  07/02/2019   COVID-19 Vaccine (4 - Booster for Pfizer series) 12/14/2020   URINE MICROALBUMIN  09/22/2021    {Colorectal cancer screening:2101809}  Mammogram status: Scheduled for 08/24/2021  {Bone Density status:21018021}  Lung Cancer Screening: (Low Dose CT Chest recommended if Age 28-80 years, 30 pack-year currently smoking OR have quit w/in 15years.) does not qualify.     Additional Screening:  Hepatitis C Screening: Completed 10/18/2015  Vision Screening: Recommended annual ophthalmology exams for early detection of glaucoma and other disorders of the eye. Is the patient up to date with their annual eye exam?  Yes  Who is the provider or what is the name of the office in which the patient attends annual eye exams? ***  Dental Screening: Recommended annual dental exams for proper oral hygiene  Community Resource Referral / Chronic Care Management: CRR required this visit?  {YES/NO:21197}  CCM required this visit?  {YES/NO:21197}     Plan:     I have personally reviewed and noted the following in the patient's chart:   Medical and social history Use of alcohol, tobacco or illicit drugs  Current medications and supplements including opioid prescriptions.  Functional ability and status Nutritional status Physical activity Advanced directives List of other physicians Hospitalizations, surgeries, and ER visits  in previous 12 months Vitals Screenings to include cognitive, depression, and falls Referrals and appointments  In addition, I have reviewed and discussed with patient certain preventive protocols, quality metrics, and best  practice recommendations. A written personalized care plan for preventive services as well as general preventive health recommendations were provided to patient.   Due to this being a telephonic visit, the after visit summary with patients personalized plan was offered to patient via mail or my-chart. ***Patient declined at this time./ Patient would like to access on my-chart/ per request, patient was mailed a copy of AVS./ Patient preferred to pick up at office at next visit.   Marta Antu, LPN   [id number]  Nurse Health Advisor  Nurse Notes: ***

## 2021-08-15 NOTE — Progress Notes (Signed)
Subjective:   Megan Duncan is a 75 y.o. female who presents for Medicare Annual (Subsequent) preventive examination.  I connected with Adrijana today by telephone and verified that I am speaking with the correct person using two identifiers. Location patient: home Location provider: work Persons participating in the virtual visit: patient, Marine scientist.    I discussed the limitations, risks, security and privacy concerns of performing an evaluation and management service by telephone and the availability of in person appointments. I also discussed with the patient that there may be a patient responsible charge related to this service. The patient expressed understanding and verbally consented to this telephonic visit.    Interactive audio and video telecommunications were attempted between this provider and patient, however failed, due to patient having technical difficulties OR patient did not have access to video capability.  We continued and completed visit with audio only.  Some vital signs may be absent or patient reported.   Time Spent with patient on telephone encounter: 45 minutes   Review of Systems     Cardiac Risk Factors include: advanced age (>28mn, >>24women);obesity (BMI >30kg/m2);dyslipidemia;diabetes mellitus;hypertension     Objective:    Today's Vitals   08/15/21 1543 08/15/21 1544  Weight: 199 lb (90.3 kg)   Height: 5' 2.25" (1.581 m)   PainSc:  5    Body mass index is 36.11 kg/m.  Advanced Directives 08/15/2021 11/16/2019 06/19/2019 06/17/2018 01/10/2017 04/07/2015  Does Patient Have a Medical Advance Directive? No No Yes No No Yes  Type of Advance Directive - -Public librarianLiving will - - HHarrisLiving will  Does patient want to make changes to medical advance directive? No - Patient declined - No - Patient declined - - No - Patient declined  Copy of HMagnoliain Chart? - - No - copy requested - - No - copy  requested  Would patient like information on creating a medical advance directive? - No - Patient declined - No - Patient declined No - Patient declined -    Current Medications (verified) Outpatient Encounter Medications as of 08/15/2021  Medication Sig   albuterol (VENTOLIN HFA) 108 (90 Base) MCG/ACT inhaler Inhale 2 puffs into the lungs every 4 (four) hours as needed. For shortness of breath and wheezing   amLODipine (NORVASC) 5 MG tablet TAKE 1 TABLET (5 MG TOTAL) BY MOUTH DAILY.   blood glucose meter kit and supplies Dispense based on patient and insurance preference. Use once a day.  DX Code: E11.9   budesonide-formoterol (SYMBICORT) 80-4.5 MCG/ACT inhaler Inhale 2 puffs into the lungs 2 (two) times daily.   clorazepate (TRANXENE) 3.75 MG tablet Take 1 tablet (3.75 mg total) by mouth 2 (two) times daily as needed for anxiety or sleep.   cyclobenzaprine (FLEXERIL) 10 MG tablet Take 1 tablet (10 mg total) by mouth 3 (three) times daily as needed. For muscle spasms   DULoxetine (CYMBALTA) 20 MG capsule TAKE 2 CAPSULES BY MOUTH DAILY TAKE WITH 60 MG   DULoxetine (CYMBALTA) 60 MG capsule TAKE 1 CAPSULE BY MOUTH EVERY MORNING   famotidine (PEPCID) 20 MG tablet Take 1 tablet (20 mg total) by mouth 2 (two) times daily.   gabapentin (NEURONTIN) 300 MG capsule Take 1 capsule (300 mg total) by mouth at bedtime.   glucose blood test strip Use as instructed once a day.  Dx Code: E11.9   hydrocortisone-pramoxine (ANALPRAM HC) 2.5-1 % rectal cream Place 1 application rectally 3 (three)  times daily.   Lancets MISC Use as directed once a day.  Dx code: E11.9   levothyroxine (SYNTHROID) 50 MCG tablet TAKE 1 TABLET BY MOUTH DAILY BEFORE BREAKFAST   lithium carbonate 150 MG capsule TAKE 1 CAPSULE (150 MG TOTAL) BY MOUTH DAILY, TAKE WITH LITHIUM 600 MG TO EQUAL 750 MG TOTAL   lithium carbonate 300 MG capsule TAKE 2 CAPSULES (600 MG) BY MOUTH AT BEDTIME, WITH 150 MG CAPSULE   metFORMIN (GLUCOPHAGE) 1000 MG  tablet Take 1 tablet (1,000 mg total) by mouth 2 (two) times daily with a meal.   metFORMIN (GLUCOPHAGE) 500 MG tablet TAKE 1 TABLET BY MOUTH 2 TIMES DAILY WITH A MEAL.   nystatin cream (MYCOSTATIN) Apply 1 application topically 2 (two) times daily.   omega-3 acid ethyl esters (LOVAZA) 1 g capsule Take by mouth 2 (two) times daily.   propranolol (INDERAL) 20 MG tablet TAKE 1 TABLET BY MOUTH TWICE A DAY   tiZANidine (ZANAFLEX) 4 MG capsule    topiramate (TOPAMAX) 25 MG tablet Take 1 tablet (25 mg total) by mouth 2 (two) times daily.   traMADol (ULTRAM) 50 MG tablet 1-2 po q6h prn   vitamin B-12 (CYANOCOBALAMIN) 100 MCG tablet Take 100 mcg by mouth daily.   cephALEXin (KEFLEX) 500 MG capsule Take 1 capsule (500 mg total) by mouth 2 (two) times daily. (Patient not taking: Reported on 08/15/2021)   No facility-administered encounter medications on file as of 08/15/2021.    Allergies (verified) Atorvastatin, Codeine, Diclofenac, Doxycycline, and Sulfonamide derivatives   History: Past Medical History:  Diagnosis Date   Anxiety    Arthritis    hands   Bipolar II disorder (HCC)    Dr. Charlott Holler   COPD (chronic obstructive pulmonary disease) ([place])    Depression    Diastolic dysfunction    Per pt, diagnosed after [facility]   Diverticulosis    Fibromyalgia    Hyperlipidemia    Hypertension    LBP (low back pain)    Past Surgical History:  Procedure Laterality Date   APPENDECTOMY     EYE SURGERY     KNEE ARTHROSCOPY Right 11/12/2006   TONSILLECTOMY     TOTAL ABDOMINAL HYSTERECTOMY     Family History  Problem Relation Age of Onset   Colon cancer Maternal Aunt 18   Diabetes Father    Hypertension Father    Heart attack Father    Alcoholism Father    Hypertension Mother    Hiatal hernia Mother    Mental illness Sister        suicide   Breast cancer Maternal Aunt    Social History   Socioeconomic History   Marital status: Married    Spouse name: Not on file   Number of children:  3   Years of education: Not on file   Highest education level: Not on file  Occupational History   Occupation: retired  Tobacco Use   Smoking status: Former    Packs/day: 1.50    Years: 40.00    Pack years: 60.00    Types: Cigarettes    Quit date: 07/02/2004    Years since quitting: 17.1   Smokeless tobacco: Never  Substance and Sexual Activity   Alcohol use: Yes    Alcohol/week: 0.0 - 1.0 standard drinks    Comment: occasional wine   Drug use: No   Sexual activity: Never  Other Topics Concern   Not on file  Social History Narrative   Lives with husband  and grandson she is raising.   Social Determinants of Health   Financial Resource Strain: Low Risk    Difficulty of Paying Living Expenses: Not hard at all  Food Insecurity: No Food Insecurity   Worried About Charity fundraiser in the Last Year: Never true   [place] in the Last Year: Never true  Transportation Needs: No Transportation Needs   Lack of Transportation (Medical): No   Lack of Transportation (Non-Medical): No  Physical Activity: Inactive   Days of Exercise per Week: 0 days   Minutes of Exercise per Session: 0 min  Stress: No Stress Concern Present   Feeling of Stress : Not at all  Social Connections: Moderately Integrated   Frequency of Communication with Friends and Family: More than three times a week   Frequency of Social Gatherings with Friends and Family: More than three times a week   Attends Religious Services: Never   Marine scientist or Organizations: Yes   Attends Music therapist: More than 4 times per year   Marital Status: Married    Tobacco Counseling Counseling given: Not Answered   Clinical Intake:  Pre-visit preparation completed: Yes  Pain : 0-10 Pain Score: 5  Pain Type: Chronic pain (arthritis & spinal stenosis) Pain Location: Back Pain Onset: More than a month ago Pain Frequency: Constant     BMI - recorded: 36.11 Nutritional Status: BMI > 30   Obese Nutritional Risks: None Diabetes: Yes CBG done?: No Did pt. bring in CBG monitor from home?: No (phone visit)  How often do you need to have someone help you when you read instructions, pamphlets, or other written materials from your doctor or pharmacy?: 1 - Never  Diabetes:  Is the patient diabetic?  Yes  If diabetic, was a CBG obtained today?  No  Did the patient bring in their glucometer from home?  No phone visit How often do you monitor your CBG's? Occasionally.   Financial Strains and Diabetes Management:  Are you having any financial strains with the device, your supplies or your medication? No .  Does the patient want to be seen by Chronic Care Management for management of their diabetes?  No  Would the patient like to be referred to a Nutritionist or for Diabetic Management?  No   Diabetic Exams:  Diabetic Eye Exam: Completed 01/19/2021.   Diabetic Foot Exam: Completed 09/22/2020.    Interpreter Needed?: No  Information entered by :: Caroleen Hamman LPN   Activities of Daily Living In your present state of health, do you have any difficulty performing the following activities: 08/15/2021 09/22/2020  Hearing? N Y  Vision? N N  Difficulty concentrating or making decisions? Y N  Walking or climbing stairs? Y Y  Dressing or bathing? Y Y  Doing errands, shopping? Y N  Preparing Food and eating ? Y -  Using the Toilet? N -  In the past six months, have you accidently leaked urine? N -  Do you have problems with loss of bowel control? N -  Managing your Medications? N -  Managing your Finances? N -  Housekeeping or managing your Housekeeping? Y -  Some recent data might be hidden    Patient Care Team: Carollee Herter, Alferd Apa, DO as PCP - General (Family Medicine) Comer Locket, [place] as Physician Assistant (Physician Assistant) Milus Banister, MD as Attending Physician (Gastroenterology) Sherlynn Stalls, MD as Consulting Physician (Ophthalmology)  Indicate  any recent Medical  Services you may have received from other than [facility] providers in the past year (date may be approximate).     Assessment:   This is a routine wellness examination for Jacquetta.  Hearing/Vision screen Hearing Screening - Comments:: C/o mild hearing loss Vision Screening - Comments:: Last eye exam-2022  Dietary issues and exercise activities discussed: Current Exercise Habits: The patient does not participate in regular exercise at present, Exercise limited by: orthopedic condition(s)   Goals Addressed             This Visit's Progress    Patient Stated       Increase mobility       Depression Screen PHQ 2/9 Scores 08/15/2021 11/16/2019 06/19/2019 06/17/2018 01/10/2017 10/18/2015 08/01/2015  PHQ - 2 Score 0 0 0 1 6 0 0  PHQ- 9 Score - - - - 16 - -  Exception Documentation - - - - - Patient refusal -    Fall Risk Fall Risk  08/15/2021 05/25/2020 11/16/2019 06/19/2019 06/17/2018  Falls in the past year? 0 1 0 1 No  Comment - - - - -  Number falls in past yr: 0 0 - 0 -  Comment - - - - -  Injury with Fall? 0 0 - 0 -  Follow up Falls prevention discussed Falls evaluation completed - Education provided;Falls prevention discussed -    FALL RISK PREVENTION PERTAINING TO THE HOME:  Any stairs in or around the home? Yes  If so, are there any without handrails? No  Home free of loose throw rugs in walkways, pet beds, electrical cords, etc? Yes  Adequate lighting in your home to reduce risk of falls? Yes   ASSISTIVE DEVICES UTILIZED TO PREVENT FALLS:  Life alert? No  Use of a cane, walker or w/c? No  Grab bars in the bathroom? No  Shower chair or bench in shower? Yes  Elevated toilet seat or a handicapped toilet? No   TIMED UP AND GO:  Was the test performed? No . Phone visit   Cognitive Function:Patient states she has noticed some issues with word retrieval. She thinks it may be from some of her meds. She plans to discuss with PCP.  MMSE - Mini Mental State Exam  01/10/2017  Orientation to time 5  Orientation to Place 5  Registration 3  Attention/ Calculation 4  Recall 3  Language- name 2 objects 2  Language- repeat 1  Language- follow 3 step command 3  Language- read & follow direction 1  Write a sentence 1  Copy design 1  Total score 29     6CIT Screen 08/15/2021  What Year? 0 points  What month? 0 points  What time? 3 points  Count back from 20 0 points  Months in reverse 4 points  Repeat phrase 2 points  Total Score 9    Immunizations Immunization History  Administered Date(s) Administered   Fluad Quad(high Dose 65+) 07/13/2021   Influenza Whole 08/17/2009, 08/09/2010, 09/24/2012, 08/11/2013   Influenza, High Dose Seasonal PF 09/12/2016, 08/30/2017, 09/23/2018   Influenza,inj,Quad PF,6+ Mos 07/15/2014, 08/01/2015   Influenza-Unspecified 09/21/2020   PFIZER(Purple Top)SARS-COV-2 Vaccination 12/24/2019, 01/18/2020, 09/21/2020   Pneumococcal Conjugate-13 07/15/2014   Pneumococcal Polysaccharide-23 08/01/2015   Zoster, Live 09/24/2012    TDAP status: Due, Education has been provided regarding the importance of this vaccine. Advised may receive this vaccine at local pharmacy or Health Dept. Aware to provide a copy of the vaccination record if obtained from local pharmacy or Health Dept.  Verbalized acceptance and understanding.  Flu Vaccine status: Up to date  Pneumococcal vaccine status: Up to date  Covid-19 vaccine status: Information provided on how to obtain vaccines. Booster due  Qualifies for Shingles Vaccine? Yes   Zostavax completed Yes   Shingrix Completed?: No.    Education has been provided regarding the importance of this vaccine. Patient has been advised to call insurance company to determine out of pocket expense if they have not yet received this vaccine. Advised may also receive vaccine at local pharmacy or Health Dept. Verbalized acceptance and understanding.  Screening Tests Health Maintenance  Topic Date Due    TETANUS/TDAP  Never done   Zoster Vaccines- Shingrix (1 of 2) Never done   COLONOSCOPY (Pts 45-35yr Insurance coverage will need to be confirmed)  07/17/2015   MAMMOGRAM  07/02/2019   COVID-19 Vaccine (4 - Booster for Pfizer series) 12/14/2020   URINE MICROALBUMIN  09/22/2021   FOOT EXAM  09/22/2021   HEMOGLOBIN A1C  01/15/2022   OPHTHALMOLOGY EXAM  01/19/2022   INFLUENZA VACCINE  Completed   DEXA SCAN  Completed   Hepatitis C Screening  Completed   HPV VACCINES  Aged Out    Health Maintenance  Health Maintenance Due  Topic Date Due   TETANUS/TDAP  Never done   Zoster Vaccines- Shingrix (1 of 2) Never done   COLONOSCOPY (Pts 45-458yrInsurance coverage will need to be confirmed)  07/17/2015   MAMMOGRAM  07/02/2019   COVID-19 Vaccine (4 - Booster for PfMc[place]osheries) 12/14/2020   URINE MICROALBUMIN  09/22/2021    Colorectal cancer screening: Due-Patient states she will call GI to schedule  Mammogram status: Scheduled for 08/24/2021  Bone Density status: Ordered today. Pt provided with contact info and advised to call to schedule appt.  Lung Cancer Screening: (Low Dose CT Chest recommended if Age 75-80ears, 30 pack-year currently smoking OR have quit w/in 15years.) does not qualify.    Additional Screening:  Hepatitis C Screening: Completed 10/18/2015  Vision Screening: Recommended annual ophthalmology exams for early detection of glaucoma and other disorders of the eye. Is the patient up to date with their annual eye exam?  Yes  Who is the provider or what is the name of the office in which the patient attends annual eye exams? Dr. SaBaird Cancer Dental Screening: Recommended annual dental exams for proper oral hygiene  Community Resource Referral / Chronic Care Management: CRR required this visit?  No   CCM required this visit?  No      Plan:     I have personally reviewed and noted the following in the patient's chart:   Medical and social history Use of  alcohol, tobacco or illicit drugs  Current medications and supplements including opioid prescriptions.  Functional ability and status Nutritional status Physical activity Advanced directives List of other physicians Hospitalizations, surgeries, and ER visits in previous 12 months Vitals Screenings to include cognitive, depression, and falls Referrals and appointments  In addition, I have reviewed and discussed with patient certain preventive protocols, quality metrics, and best practice recommendations. A written personalized care plan for preventive services as well as general preventive health recommendations were provided to patient.   Due to this being a telephonic visit, the after visit summary with patients personalized plan was offered to patient via mail or my-chart.  Patient would like to access on my-chart.   MaMarta AntuLPN   [id number] Health Advisor  Nurse Notes: None

## 2021-08-15 NOTE — Patient Instructions (Signed)
Megan Duncan , Thank you for taking time to complete your Medicare Wellness Visit. I appreciate your ongoing commitment to your health goals. Please review the following plan we discussed and let me know if I can assist you in the future.   Screening recommendations/referrals: Colonoscopy: Due-Please call GI to schedule. Mammogram: Scheduled for 08/24/2021. Bone Density: Ordered today. Someone will call you to schedule. Recommended yearly ophthalmology/optometry visit for glaucoma screening and checkup Recommended yearly dental visit for hygiene and checkup  Vaccinations: Influenza vaccine: Up to date Pneumococcal vaccine: Up to date Tdap vaccine: Discuss with pharmacy Shingles vaccine: Discuss with pharmacy   Covid-19:Booster available at the pharmacy.  Advanced directive: Please bring a copy for your chart.  Conditions/risks identified: See problem list  Next appointment: Follow up in one year for your annual wellness visit    Preventive Care 65 Years and Older, Female Preventive care refers to lifestyle choices and visits with your health care provider that can promote health and wellness. What does preventive care include? A yearly physical exam. This is also called an annual well check. Dental exams once or twice a year. Routine eye exams. Ask your health care provider how often you should have your eyes checked. Personal lifestyle choices, including: Daily care of your teeth and gums. Regular physical activity. Eating a healthy diet. Avoiding tobacco and drug use. Limiting alcohol use. Practicing safe sex. Taking low-dose aspirin every day. Taking vitamin and mineral supplements as recommended by your health care provider. What happens during an annual well check? The services and screenings done by your health care provider during your annual well check will depend on your age, overall health, lifestyle risk factors, and family history of disease. Counseling  Your  health care provider may ask you questions about your: Alcohol use. Tobacco use. Drug use. Emotional well-being. Home and relationship well-being. Sexual activity. Eating habits. History of falls. Memory and ability to understand (cognition). Work and work Statistician. Reproductive health. Screening  You may have the following tests or measurements: Height, weight, and BMI. Blood pressure. Lipid and cholesterol levels. These may be checked every 5 years, or more frequently if you are over 10 years old. Skin check. Lung cancer screening. You may have this screening every year starting at age 33 if you have a 30-pack-year history of smoking and currently smoke or have quit within the past 15 years. Fecal occult blood test (FOBT) of the stool. You may have this test every year starting at age 40. Flexible sigmoidoscopy or colonoscopy. You may have a sigmoidoscopy every 5 years or a colonoscopy every 10 years starting at age 25. Hepatitis C blood test. Hepatitis B blood test. Sexually transmitted disease (STD) testing. Diabetes screening. This is done by checking your blood sugar (glucose) after you have not eaten for a while (fasting). You may have this done every 1-3 years. Bone density scan. This is done to screen for osteoporosis. You may have this done starting at age 53. Mammogram. This may be done every 1-2 years. Talk to your health care provider about how often you should have regular mammograms. Talk with your health care provider about your test results, treatment options, and if necessary, the need for more tests. Vaccines  Your health care provider may recommend certain vaccines, such as: Influenza vaccine. This is recommended every year. Tetanus, diphtheria, and acellular pertussis (Tdap, Td) vaccine. You may need a Td booster every 10 years. Zoster vaccine. You may need this after age 27. Pneumococcal 13-valent conjugate (  PCV13) vaccine. One dose is recommended after age  52. Pneumococcal polysaccharide (PPSV23) vaccine. One dose is recommended after age 22. Talk to your health care provider about which screenings and vaccines you need and how often you need them. This information is not intended to replace advice given to you by your health care provider. Make sure you discuss any questions you have with your health care provider. Document Released: 11/25/2015 Document Revised: 07/18/2016 Document Reviewed: 08/30/2015 Elsevier Interactive Patient Education  2017 [employer] Prevention in the Home Falls can cause injuries. They can happen to people of all ages. There are many things you can do to make your home safe and to help prevent falls. What can I do on the outside of my home? Regularly fix the edges of walkways and driveways and fix any cracks. Remove anything that might make you trip as you walk through a door, such as a raised step or threshold. Trim any bushes or trees on the path to your home. Use bright outdoor lighting. Clear any walking paths of anything that might make someone trip, such as rocks or tools. Regularly check to see if handrails are loose or broken. Make sure that both sides of any steps have handrails. Any raised decks and porches should have guardrails on the edges. Have any leaves, snow, or ice cleared regularly. Use sand or salt on walking paths during winter. Clean up any spills in your garage right away. This includes oil or grease spills. What can I do in the bathroom? Use night lights. Install grab bars by the toilet and in the tub and shower. Do not use towel bars as grab bars. Use non-skid mats or decals in the tub or shower. If you need to sit down in the shower, use a plastic, non-slip stool. Keep the floor dry. Clean up any water that spills on the floor as soon as it happens. Remove soap buildup in the tub or shower regularly. Attach bath mats securely with double-sided non-slip rug tape. Do not have throw  rugs and other things on the floor that can make you trip. What can I do in the bedroom? Use night lights. Make sure that you have a light by your bed that is easy to reach. Do not use any sheets or blankets that are too big for your bed. They should not hang down onto the floor. Have a firm chair that has side arms. You can use this for support while you get dressed. Do not have throw rugs and other things on the floor that can make you trip. What can I do in the kitchen? Clean up any spills right away. Avoid walking on wet floors. Keep items that you use a lot in easy-to-reach places. If you need to reach something above you, use a strong step stool that has a grab bar. Keep electrical cords out of the way. Do not use floor polish or wax that makes floors slippery. If you must use wax, use non-skid floor wax. Do not have throw rugs and other things on the floor that can make you trip. What can I do with my stairs? Do not leave any items on the stairs. Make sure that there are handrails on both sides of the stairs and use them. Fix handrails that are broken or loose. Make sure that handrails are as long as the stairways. Check any carpeting to make sure that it is firmly attached to the stairs. Fix any carpet that is  loose or worn. Avoid having throw rugs at the top or bottom of the stairs. If you do have throw rugs, attach them to the floor with carpet tape. Make sure that you have a light switch at the top of the stairs and the bottom of the stairs. If you do not have them, ask someone to add them for you. What else can I do to help prevent falls? Wear shoes that: Do not have high heels. Have rubber bottoms. Are comfortable and fit you well. Are closed at the toe. Do not wear sandals. If you use a stepladder: Make sure that it is fully opened. Do not climb a closed stepladder. Make sure that both sides of the stepladder are locked into place. Ask someone to hold it for you, if  possible. Clearly mark and make sure that you can see: Any grab bars or handrails. First and last steps. Where the edge of each step is. Use tools that help you move around (mobility aids) if they are needed. These include: Canes. Walkers. Scooters. Crutches. Turn on the lights when you go into a dark area. Replace any light bulbs as soon as they burn out. Set up your furniture so you have a clear path. Avoid moving your furniture around. If any of your floors are uneven, fix them. If there are any pets around you, be aware of where they are. Review your medicines with your doctor. Some medicines can make you feel dizzy. This can increase your chance of falling. Ask your doctor what other things that you can do to help prevent falls. This information is not intended to replace advice given to you by your health care provider. Make sure you discuss any questions you have with your health care provider. Document Released: 08/25/2009 Document Revised: 04/05/2016 Document Reviewed: 12/03/2014 Elsevier Interactive Patient Education  2017 [employer].

## 2021-08-15 NOTE — Telephone Encounter (Signed)
Patient had a 1:40 appt for a Medicare Wellness exam. Attempted to reach her x 4.No answer. Unable to leave a message.

## 2021-08-17 ENCOUNTER — Ambulatory Visit (INDEPENDENT_AMBULATORY_CARE_PROVIDER_SITE_OTHER): Payer: Medicare Other | Admitting: Physician Assistant

## 2021-08-17 ENCOUNTER — Encounter: Payer: Self-pay | Admitting: Physician Assistant

## 2021-08-17 ENCOUNTER — Telehealth (HOSPITAL_BASED_OUTPATIENT_CLINIC_OR_DEPARTMENT_OTHER): Payer: Self-pay

## 2021-08-17 ENCOUNTER — Other Ambulatory Visit: Payer: Self-pay

## 2021-08-17 VITALS — BP 128/85 | HR 65

## 2021-08-17 DIAGNOSIS — Z79899 Other long term (current) drug therapy: Secondary | ICD-10-CM

## 2021-08-17 DIAGNOSIS — F3181 Bipolar II disorder: Secondary | ICD-10-CM

## 2021-08-17 DIAGNOSIS — R413 Other amnesia: Secondary | ICD-10-CM | POA: Diagnosis not present

## 2021-08-17 DIAGNOSIS — H9190 Unspecified hearing loss, unspecified ear: Secondary | ICD-10-CM | POA: Diagnosis not present

## 2021-08-17 DIAGNOSIS — R4189 Other symptoms and signs involving cognitive functions and awareness: Secondary | ICD-10-CM

## 2021-08-17 DIAGNOSIS — E032 Hypothyroidism due to medicaments and other exogenous substances: Secondary | ICD-10-CM | POA: Diagnosis not present

## 2021-08-17 DIAGNOSIS — F419 Anxiety disorder, unspecified: Secondary | ICD-10-CM | POA: Diagnosis not present

## 2021-08-17 NOTE — Patient Instructions (Addendum)
On the Cymbalta (duloxetine) decrease to a total of 80 mg every day for 2 weeks and then decrease to 60 mg every day until our next appointment  On the Inderal (propranolol) 20 mg, increase to 2 pills in the morning and continue 1 pill at night.  If you don't hear anything from [name] about the neuropsychologist by 08/23/21,  please call us to check on it.

## 2021-08-17 NOTE — Progress Notes (Addendum)
[facility]  Patient ID: Megan Duncan,  MRN: [id number]  PCP: Ann Held, DO  Date of Evaluation: 08/17/2021 Time spent:40 minutes  Chief Complaint:  Chief Complaint   Anxiety; Follow-up; Depression; Memory Loss     HISTORY/CURRENT STATUS: HPI For routine med check.  Her husband [name] is with her.  States that she had her annual Medicare physical last week.  She reported increased problems with memory, difficulty with naming things such as a pencil, has a hard time processing information.  She is unable to tell me how long she has had these problems.  She also is very hard of hearing so unsure if that plays a role as well.  The person who performed the physical told her to either discuss the memory issues with me or her PCP.  She continues to have chronic back pain.  Has seen a neurosurgeon and was told "I am just going to have to live with it."  She does still get together with friends monthly at least.  Otherwise does not really go much of anywhere mostly because of the pain.  She does have anxiety but does not take the Tranxene very often.  Not even weekly.  It does not help as much as it did in the past.  She sleeps fairly well depending on her pain level.  Asking to get rid of as many medicines as we can.  Does not feel that the Cymbalta has ever really helped.  She has been on that for years.  The lithium is what helps the most.  States she is not depressed.  She does get frustrated and sad because of the pain but that is all.  Energy and motivation are low.  Again mostly due to the pain she states.  Does not cry easily.  Appetite is normal and weight is stable.  No suicidal or homicidal thoughts.  Patient denies increased energy with decreased need for sleep, no increased talkativeness, no racing thoughts, no impulsivity or risky behaviors, no increased spending, no increased libido, no grandiosity, no increased irritability or anger, and no  hallucinations.  Denies dizziness, syncope, seizures, numbness, tingling, tics, unsteady gait, slurred speech, or confusion.  Continues to have bilateral hand tremor, to the point she is unable to write well at all.  Eating is hard sometimes due to the tremor.  Has chronic low back pain and stiffness.  Individual Medical History/ Review of Systems: Changes? :No    Past medications for mental health diagnoses include: Trileptal Lamictal Prozac Zoloft Effexor XR, Cymbalta, Paxil, lithium, Depakote, Latuda, Risperdal, Tegretol, Wellbutrin  Allergies: Atorvastatin, Codeine, Diclofenac, Doxycycline, and Sulfonamide derivatives  Current Medications:  Current Outpatient Medications:    albuterol (VENTOLIN HFA) 108 (90 Base) MCG/ACT inhaler, Inhale 2 puffs into the lungs every 4 (four) hours as needed. For shortness of breath and wheezing, Disp: 18 g, Rfl: 3   amLODipine (NORVASC) 5 MG tablet, TAKE 1 TABLET (5 MG TOTAL) BY MOUTH DAILY., Disp: 90 tablet, Rfl: 1   blood glucose meter kit and supplies, Dispense based on patient and insurance preference. Use once a day.  DX Code: E11.9, Disp: 1 each, Rfl: 0   budesonide-formoterol (SYMBICORT) 80-4.5 MCG/ACT inhaler, Inhale 2 puffs into the lungs 2 (two) times daily., Disp: 1 Inhaler, Rfl: 0   clorazepate (TRANXENE) 3.75 MG tablet, Take 1 tablet (3.75 mg total) by mouth 2 (two) times daily as needed for anxiety or sleep., Disp: 60 tablet, Rfl: 1   cyclobenzaprine (  FLEXERIL) 10 MG tablet, Take 1 tablet (10 mg total) by mouth 3 (three) times daily as needed. For muscle spasms, Disp: 30 tablet, Rfl: 0   DULoxetine (CYMBALTA) 60 MG capsule, TAKE 1 CAPSULE BY MOUTH EVERY MORNING, Disp: 90 capsule, Rfl: 1   glucose blood test strip, Use as instructed once a day.  Dx Code: E11.9, Disp: 30 each, Rfl: 2   hydrocortisone-pramoxine (ANALPRAM HC) 2.5-1 % rectal cream, Place 1 application rectally 3 (three) times daily., Disp: 30 g, Rfl: 3   Lancets MISC, Use as  directed once a day.  Dx code: E11.9, Disp: 30 each, Rfl: 2   levothyroxine (SYNTHROID) 50 MCG tablet, TAKE 1 TABLET BY MOUTH DAILY BEFORE BREAKFAST, Disp: 90 tablet, Rfl: 1   lithium carbonate 150 MG capsule, TAKE 1 CAPSULE (150 MG TOTAL) BY MOUTH DAILY, TAKE WITH LITHIUM 600 MG TO EQUAL 750 MG TOTAL, Disp: 90 capsule, Rfl: 0   lithium carbonate 300 MG capsule, TAKE 2 CAPSULES (600 MG) BY MOUTH AT BEDTIME, WITH 150 MG CAPSULE, Disp: 180 capsule, Rfl: 0   metFORMIN (GLUCOPHAGE) 1000 MG tablet, Take 1 tablet (1,000 mg total) by mouth 2 (two) times daily with a meal., Disp: 60 tablet, Rfl: 2   metFORMIN (GLUCOPHAGE) 500 MG tablet, TAKE 1 TABLET BY MOUTH 2 TIMES DAILY WITH A MEAL., Disp: 180 tablet, Rfl: 1   nystatin cream (MYCOSTATIN), Apply 1 application topically 2 (two) times daily., Disp: 30 g, Rfl: 0   vitamin B-12 (CYANOCOBALAMIN) 100 MCG tablet, Take 100 mcg by mouth daily., Disp: , Rfl:    cephALEXin (KEFLEX) 500 MG capsule, Take 1 capsule (500 mg total) by mouth 2 (two) times daily. (Patient not taking: Reported on 08/15/2021), Disp: 20 capsule, Rfl: 0   famotidine (PEPCID) 20 MG tablet, Take 1 tablet (20 mg total) by mouth 2 (two) times daily. (Patient not taking: Reported on 08/17/2021), Disp: 60 tablet, Rfl: 1   gabapentin (NEURONTIN) 300 MG capsule, Take 1 capsule (300 mg total) by mouth at bedtime. (Patient not taking: Reported on 08/17/2021), Disp: 30 capsule, Rfl: 1   omega-3 acid ethyl esters (LOVAZA) 1 g capsule, Take by mouth 2 (two) times daily. (Patient not taking: Reported on 08/17/2021), Disp: , Rfl:    propranolol (INDERAL) 20 MG tablet, 2 p.o. every morning, 1 p.o. nightly., Disp: 60 tablet, Rfl: 0   tiZANidine (ZANAFLEX) 4 MG capsule, , Disp: , Rfl:    topiramate (TOPAMAX) 25 MG tablet, Take 1 tablet (25 mg total) by mouth 2 (two) times daily., Disp: 60 tablet, Rfl: 1   traMADol (ULTRAM) 50 MG tablet, 1-2 po q6h prn (Patient not taking: Reported on 08/17/2021), Disp: 60 tablet, Rfl:  1 Medication Side Effects: none  Family Medical/ Social History: Changes? No  MENTAL HEALTH EXAM:  Blood pressure 128/85, pulse 65.There is no height or weight on file to calculate BMI.  General Appearance: Casual and Well Groomed  Eye Contact:  Good  Speech:  Clear and Coherent, Normal Rate, and Talkative  Volume:  Normal  Mood:  Euthymic  Affect:  Appropriate  Thought Process:  Coherent, Disorganized, Goal Directed, Irrelevant, and Descriptions of Associations: Tangential  Orientation:  Full (Time, Place, and Person)  Thought Content: Logical   Suicidal Thoughts:  No  Homicidal Thoughts:  No  Memory:  WNL  Judgement:  Good  Insight:  Good  Psychomotor Activity:   bilat hand tremor    Concentration:  Concentration: Good and Attention Span: Good  Recall:  Good  Fund of Knowledge: Good  Language: Good  Assets:  Desire for Improvement  ADL's:  Intact  Cognition: WNL  Prognosis:  Good   Most recent pertinent labs: 07/13/2021 BUN 8, creatinine 0.87 Glucose 200  TSH and lithium level were ordered 04/20/2021.  No results  MMSE screening was slightly low at 26.  See full result under screening.  DIAGNOSES:    ICD-10-CM   1. Bipolar 2 disorder (HCC)  F31.81 TSH    Lithium level    2. Encounter for long-term (current) use of medications  Z79.899 TSH    Lithium level    3. 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 Disturbed cognition  R41.89     6. Hearing loss, unspecified hearing loss type, unspecified laterality  H91.90     7. Memory loss  R41.3       Receiving Psychotherapy: No    RECOMMENDATIONS:  PDMP reviewed.  Last clorazepate filled 06/21/2021 I provided 40 minutes of face to face time during this encounter, including time spent before and after the visit in records review, complex, medical decision making, counseling pertinent to today's visit, and charting.  We discussed the problems with memory and decreased cognition.  She is  hard of hearing so it is difficult to know how much of the cognitive decline is because of inability to hear well with instructions, etc.  Referring to Dr. Alphonzo Severance for neuropsych testing. I have reached out to him. Also avoid the Tranxene as much as possible as that can cause decreased cognition and memory issues. As far as the tremor goes we will increase the propranolol.  If she starts getting dizzy, feeling faint she will let me know. Discussed getting her off certain medications.  Since she feels that the Cymbalta has not really been helpful we agree to decrease that slowly.  Reminded her that this can not only help depression but also chronic pain so if either symptom worsens then we may need to increase the Cymbalta back. No recent TSH and lithium level so those will be ordered. Increase Propranolol to 20 mg, 2 p.o. every morning and 1 p.o. nightly for the tremor.  Continue Li 750 mg qd.  Continue Synthroid 50 mcg daily. Continue Tranxene 3.75 mg 1 twice daily as needed. Decrease Cymbalta to a total of 80 mg for 2 weeks and then decrease to 60 milligrams until our next appointment. Continue fish oil, B complex and multivitamin. Draw labs as noted above. Return in 6 weeks.  Donnal Moat, PA-C

## 2021-08-18 MED ORDER — PROPRANOLOL HCL 20 MG PO TABS: ORAL_TABLET | ORAL | 0 refills | Status: DC

## 2021-08-21 ENCOUNTER — Telehealth: Payer: Self-pay | Admitting: Physician Assistant

## 2021-08-21 NOTE — Telephone Encounter (Signed)
Referral has been faxed to Dr. Karoline Caldwell at [facility].  Pt. Has been advised that they will reach out to her to schedule.

## 2021-08-21 NOTE — Telephone Encounter (Signed)
Noted  

## 2021-08-22 ENCOUNTER — Encounter: Payer: Self-pay | Admitting: Psychology

## 2021-08-24 ENCOUNTER — Other Ambulatory Visit: Payer: Medicare Other

## 2021-09-01 ENCOUNTER — Other Ambulatory Visit: Payer: Self-pay | Admitting: Physician Assistant

## 2021-09-07 ENCOUNTER — Other Ambulatory Visit: Payer: Self-pay

## 2021-09-07 ENCOUNTER — Encounter: Payer: Self-pay | Admitting: Psychology

## 2021-09-07 ENCOUNTER — Ambulatory Visit: Payer: Medicare Other | Admitting: Psychology

## 2021-09-07 ENCOUNTER — Ambulatory Visit (INDEPENDENT_AMBULATORY_CARE_PROVIDER_SITE_OTHER): Payer: Medicare Other | Admitting: Psychology

## 2021-09-07 DIAGNOSIS — M797 Fibromyalgia: Secondary | ICD-10-CM

## 2021-09-07 DIAGNOSIS — M4316 Spondylolisthesis, lumbar region: Secondary | ICD-10-CM | POA: Insufficient documentation

## 2021-09-07 DIAGNOSIS — M48061 Spinal stenosis, lumbar region without neurogenic claudication: Secondary | ICD-10-CM | POA: Insufficient documentation

## 2021-09-07 DIAGNOSIS — F0280 Dementia in other diseases classified elsewhere without behavioral disturbance: Secondary | ICD-10-CM

## 2021-09-07 DIAGNOSIS — F0670 Mild neurocognitive disorder due to known physiological condition without behavioral disturbance: Secondary | ICD-10-CM

## 2021-09-07 DIAGNOSIS — R419 Unspecified symptoms and signs involving cognitive functions and awareness: Secondary | ICD-10-CM | POA: Insufficient documentation

## 2021-09-07 DIAGNOSIS — R4189 Other symptoms and signs involving cognitive functions and awareness: Secondary | ICD-10-CM

## 2021-09-07 DIAGNOSIS — F3181 Bipolar II disorder: Secondary | ICD-10-CM | POA: Diagnosis not present

## 2021-09-07 HISTORY — DX: Mild neurocognitive disorder due to known physiological condition without behavioral disturbance: F06.70

## 2021-09-07 HISTORY — DX: Dementia in other diseases classified elsewhere, unspecified severity, without behavioral disturbance, psychotic disturbance, mood disturbance, and anxiety: F02.80

## 2021-09-07 NOTE — Progress Notes (Signed)
   Psychometrician Note   Cognitive testing was administered to Megan Duncan by Milana Kidney, B.S. (psychometrist) under the supervision of Dr. Christia Reading, Ph.D., licensed psychologist on 09/07/21. Megan Duncan did not appear overtly distressed by the testing session per behavioral observation or responses across self-report questionnaires. Rest breaks were offered.    The battery of tests administered was selected by Dr. Christia Reading, Ph.D. with consideration to Megan Duncan's current level of functioning, the nature of her symptoms, emotional and behavioral responses during interview, level of literacy, observed level of motivation/effort, and the nature of the referral question. This battery was communicated to the psychometrist. Communication between Dr. Christia Reading, Ph.D. and the psychometrist was ongoing throughout the evaluation and Dr. Christia Reading, Ph.D. was immediately accessible at all times. Dr. Christia Reading, Ph.D. provided supervision to the psychometrist on the date of this service to the extent necessary to assure the quality of all services provided.    Megan Duncan will return within approximately 1-2 weeks for an interactive feedback session with Dr. Melvyn Novas at which time her test performances, clinical impressions, and treatment recommendations will be reviewed in detail. Ms. Kemmer understands she can contact our office should she require our assistance before this time.  A total of 150 minutes of billable time were spent face-to-face with Ms. Salatino by the psychometrist. This includes both test administration and scoring time. Billing for these services is reflected in the clinical report generated by Dr. Christia Reading, Ph.D.  This note reflects time spent with the psychometrician and does not include test scores or any clinical interpretations made by Dr. Melvyn Novas. The full report will follow in a separate note.

## 2021-09-07 NOTE — Progress Notes (Addendum)
NEUROPSYCHOLOGICAL EVALUATION [place]. [facility] Department of Neurology  Date of Evaluation: September 07, 2021  Reason for Referral:   Megan Duncan is a 75 y.o. right-handed Caucasian female referred by  Donnal Moat, PA-C , to characterize her current cognitive functioning and assist with diagnostic clarity and treatment planning in the context of subjective cognitive decline and several psychiatric and medical comorbidities.   Assessment and Plan:   Clinical Impression(s): Testing was completed with an amplification device to help limit hearing loss as a potential confound. Megan Duncan pattern of performance is suggestive of ongoing impairment surrounding processing speed, complex attention/concentration, executive functioning, receptive language, verbal fluency, and both encoding (i.e., learning) and retrieval aspects of verbal memory. Performance variability was exhibited across confrontation naming and visuospatial abilities. Performance was appropriate across basic attention, safety/judgment, abstract reasoning, retention of visual information, and recognition/consolidation aspects of memory. ADL dysfunction is difficult to assess as Megan Duncan is not involved in Doctor, hospital and has not driven due to physical discomfort for several years. However, she did report that her daughter has taken over medication management due to an increased frequency of mistake making. 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 However, she may be towards the moderate to severe end of this spectrum currently.   The etiology of ongoing dysfunction is likely multifactorial in nature. I was unable to locate any prior neuroimaging (i.e., brain MRI). However, given her medical history (e.g., essential hypertension, aortic atherosclerosis, type II diabetes, hyperlipidemia, palpitations), there would  seem to be a high likelihood of a cerebrovascular contribution to ongoing cognitive dysfunction. Her patterns of impairment across testing are certainly consistent with a primary vascular etiology, especially dysfunction surrounding processing speed, complex attention, executive functioning, verbal fluency, and aspects of memory. Additional medical complications surrounding severe, debilitating chronic back pain, fibromyalgia, and reported sleep dysfunction would certainly worsen her presentation and affect similar areas of cognitive functioning. Furthermore, research has suggested that individuals with a history of bipolar disorder may see increased dysfunction surrounding executive functioning and visuospatial abilities, which would also be consistent with her presentation. Ongoing psychiatric distress in general would further worsen cognitive impairments. Overall, it seems most likely that current dysfunction is related to a combination of these factors described above.   Despite impairment surrounding encoding and retrieval aspects of memory, Megan Duncan performed very well across recognition/consolidation aspects of memory tasks. This does not suggest a true memory storage deficit and is inconsistent with typically presenting Alzheimer's disease. She does not display behavioral features for Lewy body dementia or frontotemporal dementia at the present time. As such, these neurodegenerative etiologies seem less likely based upon present information. However, continued medical monitoring will be important moving forward.  Recommendations: I would recommend that she be referred for a brain MRI to better understand any anatomical correlates to objective cognitive impairment exhibited across testing.   Given the extent of hearing loss and how this can directly impact day-to-day functioning, I would also recommend that Megan Duncan be referred for a formal audiologic evaluation and hearing aid fitting if  necessary. There have been a few studies which have linked uncorrected hearing loss with an increased risk of dementia.   A combination of medication and psychotherapy has been shown to be most effective at treating symptoms of anxiety and depression. As such, Megan Duncan is encouraged to continue working with Megan Duncan and her PCP regarding medication adjustments to  optimally manage these symptoms. She could also consider engaging in short-term psychotherapy to address symptoms of psychiatric distress. she would benefit from an active and collaborative therapeutic environment, rather than one purely supportive in nature. Recommended treatment modalities include Cognitive Behavioral Therapy (CBT) or Acceptance and Commitment Therapy (ACT).  While I imagine this has been discussed numerous times, I would encourage her to continue exploring medication and psychotherapeutic ways of addressing her chronic, debilitating pain.   A repeat neuropsychological evaluation in 18-24 months (or sooner if functional decline is noted) is recommended to assess the trajectory of future cognitive decline should it occur. This will also aid in future efforts towards improved diagnostic clarity.  Megan Duncan is encouraged to attend to lifestyle factors for brain health (e.g., regular physical exercise, good nutrition habits, regular participation in cognitively-stimulating activities, and general stress management techniques), which are likely to have benefits for both emotional adjustment and cognition. In fact, in addition to promoting good general health, regular exercise incorporating aerobic activities (e.g., brisk walking, jogging, cycling, etc.) has been demonstrated to be a very effective treatment for depression and stress, with similar efficacy rates to both antidepressant medication and psychotherapy. Optimal control of vascular risk factors (including safe cardiovascular exercise and adherence to dietary  recommendations) is encouraged. Continued participation in activities which provide mental stimulation and social interaction is also recommended.   When learning new information, she would benefit from information being broken up into small, manageable pieces. She may also find it helpful to articulate the material in her own words and in a context to promote encoding at the onset of a new task. This material may need to be repeated multiple times to promote encoding.  Memory can be improved using internal strategies such as rehearsal, repetition, chunking, mnemonics, association, and imagery. External strategies such as written notes in a consistently used memory journal, visual and nonverbal auditory cues such as a calendar on the refrigerator or appointments with alarm, such as on a cell phone, can also help maximize recall.    To address problems with processing speed, she may wish to consider:   -Ensuring that she is alerted when essential material or instructions are being presented   -Adjusting the speed at which new information is presented   -Allowing for more time in comprehending, processing, and responding in conversation  To address problems with fluctuating attention, she may wish to consider:   -Avoiding external distractions when needing to concentrate   -Limiting exposure to fast paced environments with multiple sensory demands   -Writing down complicated information and using checklists   -Attempting and completing one task at a time (i.e., no multi-tasking)   -Verbalizing aloud each step of a task to maintain focus   -Reducing the amount of information considered at one time  Review of Records:   Ms. Larrivee was seen by psychiatry Donnal Moat, PA-C) on 08/17/2021 for a routine medical/medication check. She continues to have chronic and quite severe back pain. She has been seen by neurosurgery and was reportedly told "I am just going to have to live with it." Pain symptoms  were said to be debilitating and certainly impact her functionality. Sleep quality is dependant on pain levels. She reported a history of generalized anxiety, fibromyalgia, and bipolar II disorder. She denied current depression but did acknowledge getting frustrated easily due to ongoing pain levels. She denied increased energy with decreased need for sleep, no increased talkativeness, racing thoughts, impulsivity or risky behaviors, increased spending, increased libido, grandiosity,  increased irritability/anger, or hallucinations. She also denied ongoing dizziness, syncope, seizures, numbness, tingling, tics, unsteady gait, slurred speech, or confusion. She did report an ongoing bilateral hand tremor to the point she is unable to write well.    During this appointment with Ms. Adelene Idler, Ms. Gamboa reported increased problems with memory, difficulties naming various objects, and diminished processing speed. She was unable to report how long these concerns had been present. I was unable to find recent mention of them prior to this note. Ms. Reveron is also very hard of hearing, which potentially is impacting cognitive abilities. Ultimately, she was referred for a comprehensive neuropsychological evaluation to characterize her cognitive abilities and to assist with diagnostic clarity and treatment planning.   No neuroimaging was available for my review.   Past Medical History:  Diagnosis Date   Acute pain of right shoulder 09/24/2018   Acute upper respiratory infection 09/19/2010   Allergic rhinitis 10/03/2016   Anterolisthesis of lumbar spine    Arthritis    hands   Bilateral hip pain 09/02/2014   Bipolar II disorder    Cellulitis and abscess of trunk 09/15/2009   Ceruminosis 03/04/2014   Cervicalgia 06/20/2007   Chronic low back pain 06/20/2007   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-sided sciatica 08/06/2012   COPD (chronic obstructive pulmonary disease) (HCC)    Deformity of finger 07/15/2014    Degeneration of lumbar intervertebral disc 08/22/2020   Degenerative lumbar spinal stenosis    Dermatitis 74/16/3845   Diastolic dysfunction    Per pt, diagnosed after [facility]   Diverticulosis    DOE (dyspnea on exertion) 07/20/2016   Essential (primary) hypertension 04/23/2007   Estrogen deficiency 09/01/2017   External hemorrhoid 08/01/2015   Fibromyalgia    Foraminal stenosis of lumbar region 08/22/2020   Generalized anxiety disorder 09/01/2018   Hyperlipidemia    Inflammatory disease of uterus 04/23/2007   Left flank pain 07/13/2021   Left upper quadrant pain 09/01/2017   Lower abdominal pain 08/06/2012   Lumbar back pain with radiculopathy affecting left lower extremity 01/24/2010   Lumbar radiculopathy 01/26/2021   Lump in upper inner quadrant of left breast 07/13/2021   Major depressive disorder    Otitis media 09/05/2010   Palpitations 07/20/2016   Peripheral neuropathy 02/22/2010   Retrolisthesis of vertebrae 08/22/2020   Shingles outbreak 04/14/2015   Sinusitis, acute maxillary 09/14/2013   Stress incontinence, female 08/01/2015   Thoracic aortic atherosclerosis 05/14/2017   Tinea corporis 09/01/2017   Tinnitus 03/04/2014   Torticollis 03/04/2014   Type II diabetes mellitus 06/05/2018    Past Surgical History:  Procedure Laterality Date   APPENDECTOMY     EYE SURGERY     KNEE ARTHROSCOPY Right 11/12/2006   TONSILLECTOMY     TOTAL ABDOMINAL HYSTERECTOMY      Current Outpatient Medications:    albuterol (VENTOLIN HFA) 108 (90 Base) MCG/ACT inhaler, Inhale 2 puffs into the lungs every 4 (four) hours as needed. For shortness of breath and wheezing, Disp: 18 g, Rfl: 3   amLODipine (NORVASC) 5 MG tablet, TAKE 1 TABLET (5 MG TOTAL) BY MOUTH DAILY., Disp: 90 tablet, Rfl: 1   blood glucose meter kit and supplies, Dispense based on patient and insurance preference. Use once a day.  DX Code: E11.9, Disp: 1 each, Rfl: 0   budesonide-formoterol (SYMBICORT) 80-4.5 MCG/ACT  inhaler, Inhale 2 puffs into the lungs 2 (two) times daily., Disp: 1 Inhaler, Rfl: 0   cephALEXin (KEFLEX) 500 MG capsule, Take 1 capsule (  500 mg total) by mouth 2 (two) times daily. (Patient not taking: Reported on 08/15/2021), Disp: 20 capsule, Rfl: 0   clorazepate (TRANXENE) 3.75 MG tablet, Take 1 tablet (3.75 mg total) by mouth 2 (two) times daily as needed for anxiety or sleep., Disp: 60 tablet, Rfl: 1   cyclobenzaprine (FLEXERIL) 10 MG tablet, Take 1 tablet (10 mg total) by mouth 3 (three) times daily as needed. For muscle spasms, Disp: 30 tablet, Rfl: 0   DULoxetine (CYMBALTA) 60 MG capsule, TAKE 1 CAPSULE BY MOUTH EVERY DAY IN THE MORNING, Disp: 30 capsule, Rfl: 0   famotidine (PEPCID) 20 MG tablet, Take 1 tablet (20 mg total) by mouth 2 (two) times daily. (Patient not taking: Reported on 08/17/2021), Disp: 60 tablet, Rfl: 1   gabapentin (NEURONTIN) 300 MG capsule, Take 1 capsule (300 mg total) by mouth at bedtime. (Patient not taking: Reported on 08/17/2021), Disp: 30 capsule, Rfl: 1   glucose blood test strip, Use as instructed once a day.  Dx Code: E11.9, Disp: 30 each, Rfl: 2   hydrocortisone-pramoxine (ANALPRAM HC) 2.5-1 % rectal cream, Place 1 application rectally 3 (three) times daily., Disp: 30 g, Rfl: 3   Lancets MISC, Use as directed once a day.  Dx code: E11.9, Disp: 30 each, Rfl: 2   levothyroxine (SYNTHROID) 50 MCG tablet, TAKE 1 TABLET BY MOUTH DAILY BEFORE BREAKFAST, Disp: 90 tablet, Rfl: 1   lithium carbonate 150 MG capsule, TAKE 1 CAPSULE (150 MG TOTAL) BY MOUTH DAILY, TAKE WITH LITHIUM 600 MG TO EQUAL 750 MG TOTAL, Disp: 90 capsule, Rfl: 0   lithium carbonate 300 MG capsule, TAKE 2 CAPSULES (600 MG) BY MOUTH AT BEDTIME, WITH 150 MG CAPSULE, Disp: 180 capsule, Rfl: 0   metFORMIN (GLUCOPHAGE) 1000 MG tablet, Take 1 tablet (1,000 mg total) by mouth 2 (two) times daily with a meal., Disp: 60 tablet, Rfl: 2   metFORMIN (GLUCOPHAGE) 500 MG tablet, TAKE 1 TABLET BY MOUTH 2 TIMES DAILY  WITH A MEAL., Disp: 180 tablet, Rfl: 1   nystatin cream (MYCOSTATIN), Apply 1 application topically 2 (two) times daily., Disp: 30 g, Rfl: 0   omega-3 acid ethyl esters (LOVAZA) 1 g capsule, Take by mouth 2 (two) times daily. (Patient not taking: Reported on 08/17/2021), Disp: , Rfl:    propranolol (INDERAL) 20 MG tablet, 2 p.o. every morning, 1 p.o. nightly., Disp: 60 tablet, Rfl: 0   tiZANidine (ZANAFLEX) 4 MG capsule, , Disp: , Rfl:    topiramate (TOPAMAX) 25 MG tablet, Take 1 tablet (25 mg total) by mouth 2 (two) times daily., Disp: 60 tablet, Rfl: 1   traMADol (ULTRAM) 50 MG tablet, 1-2 po q6h prn (Patient not taking: Reported on 08/17/2021), Disp: 60 tablet, Rfl: 1   vitamin B-12 (CYANOCOBALAMIN) 100 MCG tablet, Take 100 mcg by mouth daily., Disp: , Rfl:   Clinical Interview:   The following information was obtained during a clinical interview with Ms. Appleyard and her husband prior to cognitive testing.  Cognitive Symptoms: Decreased short-term memory: Endorsed. Primary difficulties included her losing her train of thought and having trouble coming up with words while speaking. Her husband reported more pronounced concerns surrounding her quickly forgetting details of conversations. Ms. Exantus stated that this was untrue. Memory dysfunction was said to be observed for the past "year or better" and seemed to be gradually worsening.  Decreased long-term memory: Denied. Decreased attention/concentration: Denied. Reduced processing speed: Endorsed. Difficulties with executive functions: Largely denied. She noted that debilitating back pain  significantly limits her mobility, which in turn worsens organizational abilities around her home. She and her husband denied trouble with impulsivity or any recent personality changes.  Difficulties with emotion regulation: Denied. Difficulties with receptive language: Denied assuming she is able to hear the source of the sound adequately.  Difficulties with  word finding: Endorsed. Decreased visuoperceptual ability: Denied.  Difficulties completing ADLs: Somewhat. Until fairly recently, Ms. Loberg reported managing her medications independently. However, she reported that her daughter had fully taken over this endeavor. This was due to her daughter expressing concerns that Ms. Champagne was making too many mistakes while managing her medications. Ms. Kamaka did not confirm or deny increased mistake making. Her husband manages finances which is longstanding in nature. She has not driven for the past two years due to back pain and other physical complications.   Additional Medical History: History of traumatic brain injury/concussion: Denied. History of stroke: Denied. History of seizure activity: Denied. History of known exposure to toxins: Denied. Symptoms of chronic pain: Endorsed. As mentioned above, Ms. Mccammon reported severe, debilitating back pain which affects the rest of her body. Her husband noted that she is essentially home bound due to the extent of these symptoms. They reported trialing many different treatments over the years, with no one treatment yielding significant improvements. They further stated that her neurosurgeon has expressed notable apprehension surrounding a surgical intervention.  Experience of frequent headaches/migraines: Denied. Frequent instances of dizziness/vertigo: Denied.  Sensory changes: She wears glasses with benefit. Her husband reported that she is very hard of hearing. She does not have hearing aids and had not recently completed an audiologic exam. Other sensory changes/difficulties (e.g., taste or smell) were denied.  Balance/coordination difficulties: Endorsed. Balance instability was primary attributed to severe back pain which affects other parts of the body.  Other motor difficulties: Endorsed. She reported intermittent tremors in her upper extremities bilaterally. These were said to be more pronounced  while she is performing various actions and may be related to medication side effects.   Sleep History: Estimated hours obtained each night: Unsure. She described her sleep as "terrible" and "awful," noting significant intermittent trouble falling and staying asleep throughout the night. Her husband agreed, stating that there was "no rhythm" to experienced sleep dysfunction.  Difficulties falling asleep: Endorsed. Difficulties staying asleep: Endorsed. Feels rested and refreshed upon awakening: Denied.  History of snoring: Denied. History of waking up gasping for air: Denied. Witnessed breath cessation while asleep: Denied.  History of vivid dreaming: Endorsed. This was said to be longstanding and unchanged in nature. She reported commonly talking or yelling out while asleep.  Excessive movement while asleep: Denied. Instances of acting out her dreams: Denied.  Psychiatric/Behavioral Health History: Depression: Currently, she described her mood as "overall, pretty good." She acknowledged that she is more irritable and frustrated at times and attributed this to severe back pain and worsening functional limitations. Medical records do suggest a history of mild depressive symptoms. Current or remote suicidal ideation, intent, or plan was denied.  Anxiety: Endorsed. Symptoms were described as generalized in nature and somewhat longstanding.  Mania: Endorsed. She reported a previous diagnosis of bipolar II disorder and seemed in agreement with its presence. She denied any memorable hypomanic episodes since starting lithium.  Trauma History: Denied. Visual/auditory hallucinations: Denied. Delusional thoughts: Denied.  Tobacco: Denied. Alcohol: She reported a rare glass of wine with dinner and denied a history of problematic alcohol abuse or dependence.  Recreational drugs: Denied.  Family History: Problem Relation  Age of Onset   Hypertension Mother    Hiatal hernia Mother    Diabetes Father     Hypertension Father    Heart attack Father    Alcoholism Father    Bipolar disorder Father    Mental illness Sister    Suicidality Sister    Colon cancer Maternal Aunt 71   Breast cancer Maternal Aunt    Bipolar disorder Paternal Aunt    This information was confirmed by Ms. Beamon.  Academic/Vocational History: Highest level of educational attainment: 14 years. She graduated from high school and reported "struggling" through two additional years of college. She described herself as an average (B/C) student in earlier academic settings. Math/science based courses were noted as relative weaknesses.  History of developmental delay: Denied. History of grade repetition: Denied. Enrollment in special education courses: Denied. History of LD/ADHD: Denied.  Employment: Retired. She previously worked in Personal assistant, as well as an Scientist, water quality.   Evaluation Results:   Behavioral Observations: Ms. Babich was accompanied by her husband, arrived to her appointment on time, and was appropriately dressed and groomed. She appeared alert and oriented. She ambulated slowly with some mild instability due to pain symptoms. Gross motor functioning appeared intact upon informal observation and no abnormal movements (e.g., tremors) were noted during interview. She was somewhat hard of hearing. Her affect was generally relaxed and positive. Spontaneous speech was fluent. Word finding difficulties were observed during interview. Thought processes were coherent, organized, and normal in content. Insight into her cognitive difficulties appeared adequate.   An amplification device (i.e., pocket talker) was utilized during testing to eliminate hearing confounds. Sustained attention was appropriate. Task engagement was adequate and she generally persisted when challenged. She did appear to get frustrated and self-discontinued one task Garment/textile technologist) due to difficulty with task completion. Word finding  difficulties were notable primarily during a confrontation naming task. She did require instructions to be repeated several times, especially across more complex tasks (TMT B, D-KEFS Color Word). Overall, Ms. Antilla was cooperative with the clinical interview and subsequent testing procedures.   Adequacy of Effort: The validity of neuropsychological testing is limited by the extent to which the individual being tested may be assumed to have exerted adequate effort during testing. Ms. Basquez expressed her intention to perform to the best of her abilities and exhibited adequate task engagement and persistence. Scores across stand-alone and embedded performance validity measures were within expectation. As such, the results of the current evaluation are believed to be a valid representation of Ms. Cotugno's current cognitive functioning.  Test Results: Ms. Heslin was largely oriented at the time of the current evaluation. She had trouble recalling her phone number and was one month off ("November") when stating the current month.  Intellectual abilities based upon educational and vocational attainment were estimated to be in the average range. Premorbid abilities were estimated to be within the lower limits of the average range based upon a single-word reading test.   Processing speed was exceptionally low to below average. Basic attention was average. More complex attention (e.g., working memory) was well below average. Executive functioning was largely exceptionally low. She did perform in the average range on a task assessing safety and judgment, as well as another task assessing abstract reasoning.  Assessed receptive language abilities were exceptionally low. Greatest difficulties across this task were seen when sequencing commands, understanding complex sentence structure, and following multi-step commands. Assessed expressive language was largely exceptionally low to below average. Confrontation  naming  was average on a screening instrument but well below average on a more comprehensive assessment.     Assessed visuospatial/visuoconstructional abilities were variable, ranging from the exceptionally low to average normative ranges. On her drawing of a clock, she first divided the clock into four quadrants. Unfortunately, this led to significant numerical spacing errors due to these quadrant lines being where numbers should have been placed. The number 12 was placed outside the clock while all others were inside the outer circle. She was unable to draw the clock hands. When drawing a complex figure, she was initially stimulus bound. However, she realized this and made a second attempt which was much improved. Points were lost on her second attempt due to mild distortions. She had significant trouble understanding task instructions across a line orientation task. Many of her incorrect responses were very far from accurate responses.    Learning (i.e., encoding) of novel verbal information was exceptionally low to well below average. Spontaneous delayed recall (i.e., retrieval) of previously learned information was variable, ranging from the exceptionally low to average normative ranges. Retention rates were 80% (raw score of 4) across a story learning task, 0% across a list learning task, and 53% across a figure drawing task. Performance across recognition tasks was average, suggesting evidence for information consolidation.   Results of emotional screening instruments suggested that recent symptoms of generalized anxiety were in the mild range, while symptoms of depression were within normal limits. A screening instrument assessing recent sleep quality suggested the presence of moderate sleep dysfunction.  Tables of Scores:   Note: This summary of test scores accompanies the interpretive report and should not be considered in isolation without reference to the appropriate sections in the text.  Descriptors are based on appropriate normative data and may be adjusted based on clinical judgment. Terms such as "Within Normal Limits" and "Outside Normal Limits" are used when a more specific description of the test score cannot be determined.       Percentile - Normative Descriptor > 98 - Exceptionally High 91-97 - Well Above Average 75-90 - Above Average 25-74 - Average 9-24 - Below Average 2-8 - Well Below Average < 2 - Exceptionally Low       Validity:   DESCRIPTOR       Dot Counting Test: --- --- Within Normal Limits  RBANS Effort Index: --- --- Within Normal Limits  WAIS-IV Reliable Digit Span: --- --- Within Normal Limits  D-KEFS Color Word Effort Index: --- --- Within Normal Limits       Orientation:      Raw Score Percentile   NAB Orientation, Form 1 26/29 --- ---       Cognitive Screening:      Raw Score Percentile   SLUMS: 14/30 --- ---       RBANS, Form A: Standard Score/ Scaled Score Percentile   Total Score 70 2 Well Below Average  Immediate Memory 57 <1 Exceptionally Low    List Learning 2 <1 Exceptionally Low    Story Memory 4 2 Well Below Average  Visuospatial/Constructional 78 7 Well Below Average    Figure Copy 9 37 Average    Line Orientation 9/20 <2 Exceptionally Low  Language 82 12 Below Average    Picture Naming 9/10 26-50 Average    Semantic Fluency 3 1 Exceptionally Low  Attention 79 8 Well Below Average    Digit Span 10 50 Average    Coding 3 1 Exceptionally Low  Delayed Memory 87 19  Below Average    List Recall 0/10 <2 Exceptionally Low    List Recognition 20/20 51-75 Average    Story Recall 5 5 Well Below Average    Story Recognition 10/12 27-46 Average    Figure Recall 8 25 Average    Figure Recognition 5/8 21-29 Average       Intellectual Functioning:      Standard Score Percentile   Test of Premorbid Functioning: 93 32 Average       Attention/Executive Function:     Trail Making Test (TMT): Raw Score (T Score) Percentile      Part A 57 secs.,  0 errors (36) 8 Well Below Average    Part B Discontinued --- Impaired         Scaled Score Percentile   WAIS-IV Digit Span: 6 9 Below Average    Forward 10 50 Average    Backward 5 5 Well Below Average    Sequencing 5 5 Well Below Average        Scaled Score Percentile   WAIS-IV Similarities: 8 25 Average       D-KEFS Color-Word Interference Test: Raw Score (Scaled Score) Percentile     Color Naming 40 secs. (7) 16 Below Average    Word Reading 32 secs. (7) 16 Below Average    Inhibition 100 secs. (6) 9 Below Average      Total Errors 28 errors (1) <1 Exceptionally Low    Inhibition/Switching 106 secs. (6) 9 Below Average      Total Errors 22 errors (1) <1 Exceptionally Low       D-KEFS Verbal Fluency Test: Raw Score (Scaled Score) Percentile     Letter Total Correct 8 (2) <1 Exceptionally Low    Category Total Correct 24 (6) 9 Below Average    Category Switching Total Correct 5 (2) <1 Exceptionally Low    Category Switching Accuracy 4 (3) 1 Exceptionally Low      Total Set Loss Errors 1 (11) 63 Average      Total Repetition Errors 0 (13) 84 Above Average       NAB Executive Functions Module, Form 1: T Score Percentile     Judgment 53 62 Average       Language:     Verbal Fluency Test: Raw Score (T Score) Percentile     Phonemic Fluency (FAS) 8 (17) <1 Exceptionally Low    Animal Fluency 12 (36) 8 Well Below Average        NAB Language Module, Form 1: T Score Percentile     Auditory Comprehension 19 <1 Exceptionally Low    Naming 24/31 (29) 2 Well Below Average       Visuospatial/Visuoconstruction:      Raw Score Percentile   Clock Drawing: 4/10 --- Impaired        Scaled Score Percentile   WAIS-IV Block Design: 9 37 Average       Mood and Personality:      Raw Score Percentile   Geriatric Depression Scale: 9 --- Within Normal Limits  Geriatric Anxiety Scale: 14 --- Mild    Somatic 8 --- Mild    Cognitive 3 --- Mild    Affective 3 ---  Minimal       Additional Questionnaires:      Raw Score Percentile   PROMIS Sleep Disturbance Questionnaire: 30 --- Moderate   Informed Consent and Coding/Compliance:   The current evaluation represents a clinical evaluation for the purposes previously outlined by the  referral source and is in no way reflective of a forensic evaluation.   Ms. Bouie was provided with a verbal description of the nature and purpose of the present neuropsychological evaluation. Also reviewed were the foreseeable risks and/or discomforts and benefits of the procedure, limits of confidentiality, and mandatory reporting requirements of this provider. The patient was given the opportunity to ask questions and receive answers about the evaluation. Oral consent to participate was provided by the patient.   This evaluation was conducted by Christia Reading, Ph.D., licensed clinical neuropsychologist. Ms. Kuba completed a clinical interview with Dr. Melvyn Novas, billed as one unit [phone], and 150 minutes of cognitive testing and scoring, billed as one unit [phone] and four additional units [id number]. Psychometrist Milana Kidney, B.S., assisted Dr. Melvyn Novas with test administration and scoring procedures. As a separate and discrete service, Dr. Melvyn Novas spent a total of 160 minutes in interpretation and report writing billed as one unit [phone] and two units [id number].

## 2021-09-10 ENCOUNTER — Other Ambulatory Visit: Payer: Self-pay | Admitting: Physician Assistant

## 2021-09-11 ENCOUNTER — Encounter: Payer: Self-pay | Admitting: Psychology

## 2021-09-13 ENCOUNTER — Other Ambulatory Visit: Payer: Self-pay | Admitting: Physician Assistant

## 2021-09-14 ENCOUNTER — Ambulatory Visit: Payer: Medicare Other | Admitting: Physician Assistant

## 2021-09-14 ENCOUNTER — Encounter: Payer: Medicare Other | Admitting: Psychology

## 2021-09-15 ENCOUNTER — Encounter: Payer: Self-pay | Admitting: Psychology

## 2021-09-15 ENCOUNTER — Other Ambulatory Visit: Payer: Self-pay

## 2021-09-15 ENCOUNTER — Ambulatory Visit (INDEPENDENT_AMBULATORY_CARE_PROVIDER_SITE_OTHER): Payer: Medicare Other | Admitting: Psychology

## 2021-09-15 ENCOUNTER — Other Ambulatory Visit: Payer: Self-pay | Admitting: Family Medicine

## 2021-09-15 ENCOUNTER — Telehealth: Payer: Self-pay | Admitting: Family Medicine

## 2021-09-15 DIAGNOSIS — F3181 Bipolar II disorder: Secondary | ICD-10-CM | POA: Diagnosis not present

## 2021-09-15 DIAGNOSIS — F0670 Mild neurocognitive disorder due to known physiological condition without behavioral disturbance: Secondary | ICD-10-CM | POA: Diagnosis not present

## 2021-09-15 DIAGNOSIS — H9193 Unspecified hearing loss, bilateral: Secondary | ICD-10-CM

## 2021-09-15 NOTE — Telephone Encounter (Signed)
The patient called to let us know that Dr. Melvyn Novas from [facility] neurological/psychological practice will be mailing paper to [name] explaining that the patient is in need of a ear specialist referral. She wants Low[place] to check her notes with Dr. Melvyn Novas and start the process for the referral if possible.

## 2021-09-15 NOTE — Progress Notes (Signed)
   Neuropsychology Feedback Session Megan Rung. [facility] Department of Neurology  Reason for Referral:   Megan Duncan is a 74 y.o. right-handed Caucasian female referred by  Donnal Moat, PA-C , to characterize her current cognitive functioning and assist with diagnostic clarity and treatment planning in the context of subjective cognitive decline and several psychiatric and medical comorbidities.  Feedback:   [name] completed a comprehensive neuropsychological evaluation on 09/07/2021. Please refer to that encounter for the full report and recommendations. Briefly, results suggested ongoing impairment surrounding processing speed, complex attention/concentration, executive functioning, receptive language, verbal fluency, and both encoding (i.e., learning) and retrieval aspects of verbal memory. Performance variability was exhibited across confrontation naming and visuospatial abilities. The etiology of ongoing dysfunction is likely multifactorial in nature. Given her medical history (e.g., essential hypertension, aortic atherosclerosis, type II diabetes, hyperlipidemia, palpitations), there would seem to be a high likelihood of a cerebrovascular contribution to ongoing cognitive dysfunction. Her patterns of impairment across testing are certainly consistent with a primary vascular etiology, especially dysfunction surrounding processing speed, complex attention, executive functioning, verbal fluency, and aspects of memory. Additional medical complications surrounding severe, debilitating chronic back pain, fibromyalgia, and reported sleep dysfunction would certainly worsen her presentation and affect similar areas of cognitive functioning. Furthermore, research has suggested that individuals with a history of bipolar disorder may see increased dysfunction surrounding executive functioning and visuospatial abilities, which would also be consistent with her presentation.    [name] was accompanied by her husband during the current telephone call. They were within their residence while I was within my office. I discussed the limitations of evaluation and management by telemedicine and the availability of in person appointments. [name] expressed her understanding and agreed to proceed. Content of the current session focused on the results of her neuropsychological evaluation. [name] was given the opportunity to ask questions and her questions were answered. She was encouraged to reach out should additional questions arise. A copy of her report was mailed at the conclusion of the visit.      22 minutes were spent conducting the current feedback session with [name], billed as one unit [phone].

## 2021-09-15 NOTE — Telephone Encounter (Signed)
FYI

## 2021-09-27 ENCOUNTER — Other Ambulatory Visit: Payer: Medicare Other

## 2021-09-27 ENCOUNTER — Other Ambulatory Visit: Payer: Self-pay | Admitting: Physician Assistant

## 2021-09-28 ENCOUNTER — Encounter: Payer: Self-pay | Admitting: Physician Assistant

## 2021-09-28 ENCOUNTER — Ambulatory Visit (INDEPENDENT_AMBULATORY_CARE_PROVIDER_SITE_OTHER): Payer: Medicare Other | Admitting: Physician Assistant

## 2021-09-28 DIAGNOSIS — F32A Depression, unspecified: Secondary | ICD-10-CM | POA: Diagnosis not present

## 2021-09-28 DIAGNOSIS — F3181 Bipolar II disorder: Secondary | ICD-10-CM

## 2021-09-28 DIAGNOSIS — F419 Anxiety disorder, unspecified: Secondary | ICD-10-CM | POA: Diagnosis not present

## 2021-09-28 MED ORDER — TRAZODONE HCL 50 MG PO TABS: 25.0000 mg | ORAL_TABLET | Freq: Every evening | ORAL | 1 refills | Status: DC | PRN

## 2021-09-28 NOTE — Progress Notes (Signed)
[facility] Check  Patient ID: Megan Duncan,  MRN: [id number]  PCP: Ann Held, DO  Date of Evaluation: 09/28/2021 Time spent:25 minutes  Chief Complaint:  Chief Complaint   Anxiety; Depression; Insomnia; Follow-up     Virtual Visit via Telehealth  I connected with patient by telephone, with their informed consent, and verified patient privacy and that I am speaking with the correct person using two identifiers.  I am private, in my office and the patient is at home.  I discussed the limitations, risks, security and privacy concerns of performing an evaluation and management service by telephone and the availability of in person appointments. I also discussed with the patient that there may be a patient responsible charge related to this service. The patient expressed understanding and agreed to proceed.   I discussed the assessment and treatment plan with the patient. The patient was provided an opportunity to ask questions and all were answered. The patient agreed with the plan and demonstrated an understanding of the instructions.   The patient was advised to call back or seek an in-person evaluation if the symptoms worsen or if the condition fails to improve as anticipated.  I provided 25 minutes of non-face-to-face time during this encounter.   HISTORY/CURRENT STATUS: HPI For routine med check.  Husband is also on call.   Trouble sleeping. Hard time staying asleep, wakes up at 2-3 am, wide awake. Garey for pain the past few nights and it helped keep her asleep, but then it quit working after that.  This has only been going on for about 3 weeks.  No known trigger or stressors.  Patient denies loss of interest in usual activities and is able to enjoy things.  Denies decreased energy or motivation.  She does still get together with friends monthly at least.  Otherwise does not really go much of anywhere mostly because of the pain. Appetite has not  changed.  No extreme sadness, tearfulness, or feelings of hopelessness.  Denies any changes in concentration, making decisions or remembering things.  Denies suicidal or homicidal thoughts.  She does have anxiety but does not take the Tranxene very often.  Not even weekly.  It does not help as much as it did in the past.  She sleeps fairly well depending on her pain level.  Asking to get rid of as many medicines as we can.  Does not feel that the Cymbalta has ever really helped.  She has been on that for years.  The lithium is what helps the most.  Patient denies increased energy with decreased need for sleep, no increased talkativeness, no racing thoughts, no impulsivity or risky behaviors, no increased spending, no increased libido, no grandiosity, no increased irritability or anger, and no hallucinations.  Denies dizziness, syncope, seizures, numbness, tingling, tics, unsteady gait, slurred speech, or confusion.  Continues to have bilateral hand tremor, to the point she is unable to write well at all.  Propranolol is helpful. Eating is hard sometimes due to the tremor.  Has chronic low back pain and stiffness.  Individual Medical History/ Review of Systems: Changes? :No    Past medications for mental health diagnoses include: Trileptal Lamictal Prozac Zoloft Effexor XR, Cymbalta, Paxil, lithium, Depakote, Latuda, Risperdal, Tegretol, Wellbutrin  Allergies: Atorvastatin, Codeine, Diclofenac, Doxycycline, and Sulfonamide derivatives  Current Medications:  Current Outpatient Medications:    albuterol (VENTOLIN HFA) 108 (90 Base) MCG/ACT inhaler, Inhale 2 puffs into the lungs every 4 (four) hours as needed.  For shortness of breath and wheezing, Disp: 18 g, Rfl: 3   amLODipine (NORVASC) 5 MG tablet, TAKE 1 TABLET (5 MG TOTAL) BY MOUTH DAILY., Disp: 90 tablet, Rfl: 1   blood glucose meter kit and supplies, Dispense based on patient and insurance preference. Use once a day.  DX Code: E11.9, Disp: 1  each, Rfl: 0   clorazepate (TRANXENE) 3.75 MG tablet, Take 1 tablet (3.75 mg total) by mouth 2 (two) times daily as needed for anxiety or sleep., Disp: 60 tablet, Rfl: 1   DULoxetine (CYMBALTA) 60 MG capsule, TAKE 1 CAPSULE BY MOUTH EVERY DAY IN THE MORNING, Disp: 90 capsule, Rfl: 0   levothyroxine (SYNTHROID) 50 MCG tablet, TAKE 1 TABLET BY MOUTH DAILY BEFORE BREAKFAST, Disp: 90 tablet, Rfl: 1   lithium carbonate 150 MG capsule, TAKE 1 CAPSULE (150 MG TOTAL) BY MOUTH DAILY, TAKE WITH LITHIUM 600 MG TO EQUAL 750 MG TOTAL, Disp: 90 capsule, Rfl: 0   lithium carbonate 300 MG capsule, TAKE 2 CAPSULES (600 MG) BY MOUTH AT BEDTIME, WITH 150 MG CAPSULE, Disp: 180 capsule, Rfl: 0   metFORMIN (GLUCOPHAGE) 1000 MG tablet, Take 1 tablet (1,000 mg total) by mouth 2 (two) times daily with a meal., Disp: 60 tablet, Rfl: 2   traZODone (DESYREL) 50 MG tablet, Take 0.5-2 tablets (25-100 mg total) by mouth at bedtime as needed for sleep., Disp: 30 tablet, Rfl: 1   budesonide-formoterol (SYMBICORT) 80-4.5 MCG/ACT inhaler, Inhale 2 puffs into the lungs 2 (two) times daily. (Patient not taking: Reported on 09/28/2021), Disp: 1 Inhaler, Rfl: 0   cephALEXin (KEFLEX) 500 MG capsule, Take 1 capsule (500 mg total) by mouth 2 (two) times daily. (Patient not taking: Reported on 08/15/2021), Disp: 20 capsule, Rfl: 0   cyclobenzaprine (FLEXERIL) 10 MG tablet, Take 1 tablet (10 mg total) by mouth 3 (three) times daily as needed. For muscle spasms (Patient not taking: Reported on 09/28/2021), Disp: 30 tablet, Rfl: 0   famotidine (PEPCID) 20 MG tablet, Take 1 tablet (20 mg total) by mouth 2 (two) times daily. (Patient not taking: Reported on 08/17/2021), Disp: 60 tablet, Rfl: 1   gabapentin (NEURONTIN) 300 MG capsule, Take 1 capsule (300 mg total) by mouth at bedtime. (Patient not taking: Reported on 08/17/2021), Disp: 30 capsule, Rfl: 1   glucose blood test strip, Use as instructed once a day.  Dx Code: E11.9, Disp: 30 each, Rfl: 2    hydrocortisone-pramoxine (ANALPRAM HC) 2.5-1 % rectal cream, Place 1 application rectally 3 (three) times daily., Disp: 30 g, Rfl: 3   Lancets MISC, Use as directed once a day.  Dx code: E11.9, Disp: 30 each, Rfl: 2   metFORMIN (GLUCOPHAGE) 500 MG tablet, TAKE 1 TABLET BY MOUTH 2 TIMES DAILY WITH A MEAL., Disp: 180 tablet, Rfl: 1   nystatin cream (MYCOSTATIN), Apply 1 application topically 2 (two) times daily. (Patient not taking: Reported on 09/28/2021), Disp: 30 g, Rfl: 0   omega-3 acid ethyl esters (LOVAZA) 1 g capsule, Take by mouth 2 (two) times daily. (Patient not taking: Reported on 08/17/2021), Disp: , Rfl:    propranolol (INDERAL) 20 MG tablet, TAKE 1 TABLET BY MOUTH TWICE A DAY, Disp: 60 tablet, Rfl: 0   tiZANidine (ZANAFLEX) 4 MG capsule, , Disp: , Rfl:    topiramate (TOPAMAX) 25 MG tablet, Take 1 tablet (25 mg total) by mouth 2 (two) times daily. (Patient not taking: Reported on 09/28/2021), Disp: 60 tablet, Rfl: 1   traMADol (ULTRAM) 50 MG tablet, 1-2  po q6h prn (Patient not taking: Reported on 08/17/2021), Disp: 60 tablet, Rfl: 1   vitamin B-12 (CYANOCOBALAMIN) 100 MCG tablet, Take 100 mcg by mouth daily. (Patient not taking: Reported on 09/28/2021), Disp: , Rfl:  Medication Side Effects: none  Family Medical/ Social History: Changes? No  MENTAL HEALTH EXAM:  There were no vitals taken for this visit.There is no height or weight on file to calculate BMI.  General Appearance:  unable to assess  Eye Contact:   unable to assess  Speech:  Clear and Coherent, Normal Rate, and Talkative  Volume:  Normal  Mood:  Euthymic  Affect:   unable to assess  Thought Process:  Coherent, Disorganized, Goal Directed, Irrelevant, and Descriptions of Associations: Tangential  Orientation:  Full (Time, Place, and Person)  Thought Content: Logical   Suicidal Thoughts:  No  Homicidal Thoughts:  No  Memory:  WNL  Judgement:  Good  Insight:  Good  Psychomotor Activity:   unable to assess     Concentration:  Concentration: Good and Attention Span: Good  Recall:  Good  Fund of Knowledge: Good  Language: Good  Assets:  Desire for Improvement  ADL's:  Intact  Cognition: WNL  Prognosis:  Good    DIAGNOSES:    ICD-10-CM   1. Bipolar 2 disorder (HCC)  F31.81     2. Anxiety  F41.9     3. Depression, unspecified depression type  F32.A        Receiving Psychotherapy: No    RECOMMENDATIONS:  PDMP reviewed.  Last clorazepate filled 06/21/2021 I provided 25 minutes of non-face to face time during this encounter, including time spent before and after the visit in records review, complex, medical decision making, counseling pertinent to today's visit, and charting.  Sleep hygiene discussed. Options for treatment given. Still wants to get off any meds possible. Will wean off Cymbalta.  Continue Propranolol to 20 mg, 2 p.o. every morning and 1 p.o. nightly for the tremor.  Continue Li 750 mg qd.  Continue Synthroid 50 mcg daily. Continue Tranxene 3.75 mg 1 twice daily as needed. Start Trazodone 50 mg, 1/2 to 2 pills nightly as needed. Wean off Cymbalta to 40 mg for 1 week, then 20 mg daily for 1 week. Then stop. Continue fish oil, B complex and multivitamin. Still has not had labs drawn.  She still has the order.  Importance stressed.   Return in 6 weeks.  Donnal Moat, PA-C

## 2021-10-04 ENCOUNTER — Other Ambulatory Visit: Payer: Self-pay | Admitting: Physician Assistant

## 2021-10-07 ENCOUNTER — Other Ambulatory Visit: Payer: Self-pay | Admitting: Physician Assistant

## 2021-10-09 ENCOUNTER — Other Ambulatory Visit: Payer: Self-pay | Admitting: Physician Assistant

## 2021-10-09 ENCOUNTER — Telehealth: Payer: Self-pay | Admitting: Physician Assistant

## 2021-10-09 MED ORDER — DULOXETINE HCL 60 MG PO CPEP: ORAL_CAPSULE | ORAL | 0 refills | Status: DC

## 2021-10-09 MED ORDER — DULOXETINE HCL 30 MG PO CPEP: 30.0000 mg | ORAL_CAPSULE | Freq: Every day | ORAL | 0 refills | Status: DC

## 2021-10-09 NOTE — Telephone Encounter (Signed)
Pt stated for about 3 weeks she has been more angry and irritable.She does not want this to get worse since she is also dealing with other health problems and is in pain and gets frustrated easily.Also needs to know where she can get a brain scan done.

## 2021-10-09 NOTE — Telephone Encounter (Signed)
Yes she will need a rx to [id number] - [place], [place] [address] [place] [address]

## 2021-10-09 NOTE — Telephone Encounter (Signed)
Patient called in regarding Cymbalta.States that she was taken off the Cymbalta, but since than she says things have gotten worse. She states she has been more angry and husband and daughter have noticed the change. She would like to get back on the Cymbalta. She also said that she visited the neurosurgeon due to her memory loss. She states doctor told her to get a brain scan. She questioned if TH needs to order the scan or the doctor. Pls rtc [phone]

## 2021-10-09 NOTE — Telephone Encounter (Signed)
If she needs a prescription for Cymbalta let me know, she needs to start back at 30 mg daily for 2 weeks and then increase to 60 mg daily. The doctor she saw who recommended a brain scan is the one who should order it. Thank you.

## 2021-10-09 NOTE — Telephone Encounter (Signed)
Prescriptions were sent

## 2021-10-17 ENCOUNTER — Other Ambulatory Visit: Payer: Self-pay | Admitting: Physician Assistant

## 2021-10-17 ENCOUNTER — Ambulatory Visit: Payer: Medicare Other | Admitting: Family Medicine

## 2021-10-19 ENCOUNTER — Other Ambulatory Visit: Payer: Self-pay | Admitting: Physician Assistant

## 2021-10-19 ENCOUNTER — Other Ambulatory Visit (INDEPENDENT_AMBULATORY_CARE_PROVIDER_SITE_OTHER): Payer: Medicare Other

## 2021-10-19 DIAGNOSIS — E1165 Type 2 diabetes mellitus with hyperglycemia: Secondary | ICD-10-CM | POA: Diagnosis not present

## 2021-10-19 DIAGNOSIS — E1169 Type 2 diabetes mellitus with other specified complication: Secondary | ICD-10-CM | POA: Diagnosis not present

## 2021-10-19 DIAGNOSIS — E785 Hyperlipidemia, unspecified: Secondary | ICD-10-CM

## 2021-10-19 LAB — COMPREHENSIVE METABOLIC PANEL
ALT: 17 U/L (ref 0–35)
AST: 22 U/L (ref 0–37)
Albumin: 4.2 g/dL (ref 3.5–5.2)
Alkaline Phosphatase: 54 U/L (ref 39–117)
BUN: 7 mg/dL (ref 6–23)
CO2: 29 mEq/L (ref 19–32)
Calcium: 9.8 mg/dL (ref 8.4–10.5)
Chloride: 107 mEq/L (ref 96–112)
Creatinine, Ser: 0.88 mg/dL (ref 0.40–1.20)
GFR: 64.27 mL/min (ref >60.00)
Glucose, Bld: 192 mg/dL — ABNORMAL HIGH (ref 70–99)
Potassium: 4.8 mEq/L (ref 3.5–5.1)
Sodium: 141 mEq/L (ref 135–145)
Total Bilirubin: 0.5 mg/dL (ref 0.2–1.2)
Total Protein: 6.5 g/dL (ref 6.0–8.3)

## 2021-10-19 LAB — LIPID PANEL
Cholesterol: 186 mg/dL (ref 0–200)
HDL: 70.9 mg/dL (ref >39.00)
LDL Cholesterol: 87 mg/dL (ref 0–99)
NonHDL: 114.99
Total CHOL/HDL Ratio: 3
Triglycerides: 140 mg/dL (ref 0.0–149.0)
VLDL: 28 mg/dL (ref 0.0–40.0)

## 2021-10-19 LAB — HEMOGLOBIN A1C: Hgb A1c MFr Bld: 6.4 % (ref 4.6–6.5)

## 2021-10-20 ENCOUNTER — Ambulatory Visit: Payer: Medicare Other | Admitting: Family Medicine

## 2021-10-21 ENCOUNTER — Other Ambulatory Visit: Payer: Self-pay | Admitting: Physician Assistant

## 2021-10-23 ENCOUNTER — Other Ambulatory Visit: Payer: Self-pay | Admitting: Physician Assistant

## 2021-10-23 ENCOUNTER — Telehealth: Payer: Self-pay | Admitting: Family Medicine

## 2021-10-23 ENCOUNTER — Ambulatory Visit (INDEPENDENT_AMBULATORY_CARE_PROVIDER_SITE_OTHER): Payer: Medicare Other | Admitting: Family Medicine

## 2021-10-23 ENCOUNTER — Encounter: Payer: Self-pay | Admitting: Family Medicine

## 2021-10-23 VITALS — BP 130/70 | HR 67 | Temp 98.7°F | Resp 18 | Ht 62.25 in | Wt 190.8 lb

## 2021-10-23 DIAGNOSIS — R413 Other amnesia: Secondary | ICD-10-CM | POA: Diagnosis not present

## 2021-10-23 DIAGNOSIS — F3181 Bipolar II disorder: Secondary | ICD-10-CM

## 2021-10-23 DIAGNOSIS — I10 Essential (primary) hypertension: Secondary | ICD-10-CM | POA: Diagnosis not present

## 2021-10-23 DIAGNOSIS — F419 Anxiety disorder, unspecified: Secondary | ICD-10-CM

## 2021-10-23 DIAGNOSIS — R251 Tremor, unspecified: Secondary | ICD-10-CM

## 2021-10-23 DIAGNOSIS — H9193 Unspecified hearing loss, bilateral: Secondary | ICD-10-CM

## 2021-10-23 MED ORDER — ALPRAZOLAM 0.5 MG PO TABS: ORAL_TABLET | ORAL | 0 refills | Status: DC

## 2021-10-23 NOTE — Progress Notes (Addendum)
Subjective:   By signing my name below, I, Zite Okoli, attest that this documentation has been prepared under the direction and in the presence of Ann Held, DO. 10/23/2021   Patient ID: Megan Duncan, female    DOB: 1946-11-06, 75 y.o.   MRN: [id number]  No chief complaint on file.   HPI Patient is in today for an office visit. She is accompanied by her daughter.  She reports she is still experiencing back pain and as a result, cannot do a lot of activities. She can only walk for a short period of time. She has had several steroid injections and done physical therapy which helped a little. However, she stopped and the pain has returned with more intensity. The pain has been ongoing for 2 years. The most effective thing she tried was [facility] the aquatic [facility].  She went to a neurologist to test for memory loss. Was told she does not have alzheimer's and recommended for a brain MRI. Her daughter says she is very confused, has been grouchy and is unable to keep up with anything. She has been bed-bound for the past 2 years and is considering a home health service.  Her daughter reports she has been having tremors in her hands more frequently. She was trying to wean off Cymbalta but there were side effects. She is using 20 mg inderal to manage the tremors and is not sure what is causing them.   She has been using 50 mg trazodone to help with sleep and has been doing well.  She has been going through a lot of stress at home.  Past Medical History:  Diagnosis Date   Acute pain of right shoulder 09/24/2018   Acute upper respiratory infection 09/19/2010   Allergic rhinitis 10/03/2016   Anterolisthesis of lumbar spine    Arthritis    hands   Bilateral hip pain 09/02/2014   Bipolar II disorder    Cellulitis and abscess of trunk 09/15/2009   Ceruminosis 03/04/2014   Cervicalgia 06/20/2007   Chronic low back pain 06/20/2007   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-sided  sciatica 08/06/2012   COPD (chronic obstructive pulmonary disease) (HCC)    Deformity of finger 07/15/2014   Degeneration of lumbar intervertebral disc 08/22/2020   Degenerative lumbar spinal stenosis    Dermatitis 29/47/6546   Diastolic dysfunction    Per pt, diagnosed after [facility]   Diverticulosis    DOE (dyspnea on exertion) 07/20/2016   Essential (primary) hypertension 04/23/2007   Estrogen deficiency 09/01/2017   External hemorrhoid 08/01/2015   Fibromyalgia    Foraminal stenosis of lumbar region 08/22/2020   Generalized anxiety disorder 09/01/2018   Hyperlipidemia    Inflammatory disease of uterus 04/23/2007   Left flank pain 07/13/2021   Left upper quadrant pain 09/01/2017   Lower abdominal pain 08/06/2012   Lumbar back pain with radiculopathy affecting left lower extremity 01/24/2010   Lumbar radiculopathy 01/26/2021   Lump in upper inner quadrant of left breast 07/13/2021   Major depressive disorder    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   Otitis media 09/05/2010   Palpitations 07/20/2016   Peripheral neuropathy 02/22/2010   Retrolisthesis of vertebrae 08/22/2020   Shingles outbreak 04/14/2015   Sinusitis, acute maxillary 09/14/2013   Stress incontinence, female 08/01/2015   Thoracic aortic atherosclerosis 05/14/2017   Tinea corporis 09/01/2017   Tinnitus 03/04/2014   Torticollis 03/04/2014   Type II diabetes mellitus 06/05/2018  Past Surgical History:  Procedure Laterality Date   APPENDECTOMY     EYE SURGERY     KNEE ARTHROSCOPY Right 11/12/2006   TONSILLECTOMY     TOTAL ABDOMINAL HYSTERECTOMY      Family History  Problem Relation Age of Onset   Hypertension Mother    Hiatal hernia Mother    Diabetes Father    Hypertension Father    Heart attack Father    Alcoholism Father    Bipolar disorder Father    Mental illness Sister    Suicidality Sister    Colon cancer Maternal Aunt 48   Breast cancer Maternal Aunt     Bipolar disorder Paternal Aunt     Social History   Socioeconomic History   Marital status: Married    Spouse name: Not on file   Number of children: 3   Years of education: 14   Highest education level: Some college, no degree  Occupational History   Occupation: retired  Tobacco Use   Smoking status: Former    Packs/day: 1.50    Years: 40.00    Pack years: 60.00    Types: Cigarettes    Quit date: 07/02/2004    Years since quitting: 17.3   Smokeless tobacco: Never  Substance and Sexual Activity   Alcohol use: Yes    Alcohol/week: 0.0 - 1.0 standard drinks    Comment: occasional wine   Drug use: No   Sexual activity: Never  Other Topics Concern   Not on file  Social History Narrative   Lives with husband and grandson she is raising.   Social Determinants of Health   Financial Resource Strain: Low Risk    Difficulty of Paying Living Expenses: Not hard at all  Food Insecurity: No Food Insecurity   Worried About Charity fundraiser in the Last Year: Never true   [place] in the Last Year: Never true  Transportation Needs: No Transportation Needs   Lack of Transportation (Medical): No   Lack of Transportation (Non-Medical): No  Physical Activity: Inactive   Days of Exercise per Week: 0 days   Minutes of Exercise per Session: 0 min  Stress: No Stress Concern Present   Feeling of Stress : Not at all  Social Connections: Moderately Integrated   Frequency of Communication with Friends and Family: More than three times a week   Frequency of Social Gatherings with Friends and Family: More than three times a week   Attends Religious Services: Never   Marine scientist or Organizations: Yes   Attends Music therapist: More than 4 times per year   Marital Status: Married  Human resources officer Violence: Not At Risk   Fear of Current or Ex-Partner: No   Emotionally Abused: No   Physically Abused: No   Sexually Abused: No    Outpatient Medications  Prior to Visit  Medication Sig Dispense Refill   albuterol (VENTOLIN HFA) 108 (90 Base) MCG/ACT inhaler Inhale 2 puffs into the lungs every 4 (four) hours as needed. For shortness of breath and wheezing 18 g 3   amLODipine (NORVASC) 5 MG tablet TAKE 1 TABLET (5 MG TOTAL) BY MOUTH DAILY. 90 tablet 1   blood glucose meter kit and supplies Dispense based on patient and insurance preference. Use once a day.  DX Code: E11.9 1 each 0   clorazepate (TRANXENE) 3.75 MG tablet Take 1 tablet (3.75 mg total) by mouth 2 (two) times daily as needed for anxiety or  sleep. 60 tablet 1   DULoxetine (CYMBALTA) 30 MG capsule Take 1 capsule (30 mg total) by mouth daily. For 2 weeks, then start 60 mg daily. 14 capsule 0   DULoxetine (CYMBALTA) 60 MG capsule TAKE 1 CAPSULE BY MOUTH EVERY DAY IN THE MORNING, after taking 30 mg for 2 weeks. 90 capsule 0   glucose blood test strip Use as instructed once a day.  Dx Code: E11.9 30 each 2   hydrocortisone-pramoxine (ANALPRAM HC) 2.5-1 % rectal cream Place 1 application rectally 3 (three) times daily. 30 g 3   Lancets MISC Use as directed once a day.  Dx code: E11.9 30 each 2   levothyroxine (SYNTHROID) 50 MCG tablet TAKE 1 TABLET BY MOUTH DAILY BEFORE BREAKFAST 90 tablet 1   lithium carbonate 150 MG capsule TAKE 1 CAPSULE (150 MG TOTAL) BY MOUTH DAILY, TAKE WITH LITHIUM 600 MG TO EQUAL 750 MG TOTAL 90 capsule 0   lithium carbonate 300 MG capsule TAKE 2 CAPSULES (600 MG) BY MOUTH AT BEDTIME, WITH 150 MG CAPSULE 180 capsule 0   metFORMIN (GLUCOPHAGE) 1000 MG tablet Take 1 tablet (1,000 mg total) by mouth 2 (two) times daily with a meal. 60 tablet 2   metFORMIN (GLUCOPHAGE) 500 MG tablet TAKE 1 TABLET BY MOUTH 2 TIMES DAILY WITH A MEAL. 180 tablet 1   traZODone (DESYREL) 50 MG tablet Take 0.5-2 tablets (25-100 mg total) by mouth at bedtime as needed for sleep. 30 tablet 1   cephALEXin (KEFLEX) 500 MG capsule Take 1 capsule (500 mg total) by mouth 2 (two) times daily. (Patient not  taking: Reported on 08/15/2021) 20 capsule 0   propranolol (INDERAL) 20 MG tablet TAKE 1 TABLET BY MOUTH TWICE A DAY (Patient not taking: Reported on 10/23/2021) 60 tablet 0   budesonide-formoterol (SYMBICORT) 80-4.5 MCG/ACT inhaler Inhale 2 puffs into the lungs 2 (two) times daily. (Patient not taking: Reported on 09/28/2021) 1 Inhaler 0   cyclobenzaprine (FLEXERIL) 10 MG tablet Take 1 tablet (10 mg total) by mouth 3 (three) times daily as needed. For muscle spasms (Patient not taking: Reported on 09/28/2021) 30 tablet 0   famotidine (PEPCID) 20 MG tablet Take 1 tablet (20 mg total) by mouth 2 (two) times daily. (Patient not taking: Reported on 08/17/2021) 60 tablet 1   gabapentin (NEURONTIN) 300 MG capsule Take 1 capsule (300 mg total) by mouth at bedtime. (Patient not taking: Reported on 08/17/2021) 30 capsule 1   nystatin cream (MYCOSTATIN) Apply 1 application topically 2 (two) times daily. (Patient not taking: Reported on 09/28/2021) 30 g 0   omega-3 acid ethyl esters (LOVAZA) 1 g capsule Take by mouth 2 (two) times daily. (Patient not taking: Reported on 08/17/2021)     tiZANidine (ZANAFLEX) 4 MG capsule  (Patient not taking: Reported on 08/17/2021)     topiramate (TOPAMAX) 25 MG tablet Take 1 tablet (25 mg total) by mouth 2 (two) times daily. (Patient not taking: Reported on 09/28/2021) 60 tablet 1   traMADol (ULTRAM) 50 MG tablet 1-2 po q6h prn (Patient not taking: Reported on 08/17/2021) 60 tablet 1   vitamin B-12 (CYANOCOBALAMIN) 100 MCG tablet Take 100 mcg by mouth daily. (Patient not taking: Reported on 09/28/2021)     No facility-administered medications prior to visit.    Allergies  Allergen Reactions   Atorvastatin Other (See Comments)    Significant rise in liver tests   Codeine Nausea And Vomiting   Diclofenac Other (See Comments)    Elevated LFT's  Doxycycline     Extreme nausea   Sulfonamide Derivatives Swelling    Review of Systems  Constitutional:  Negative for fever.   HENT:  Negative for congestion, ear pain, hearing loss, sinus pain and sore throat.   Eyes:  Negative for blurred vision and pain.  Respiratory:  Negative for cough, sputum production, shortness of breath and wheezing.   Cardiovascular:  Negative for chest pain and palpitations.  Gastrointestinal:  Negative for blood in stool, constipation, diarrhea, nausea and vomiting.  Genitourinary:  Negative for dysuria, frequency, hematuria and urgency.  Musculoskeletal:  Positive for back pain. Negative for falls and myalgias.  Neurological:  Positive for tremors. Negative for dizziness, sensory change, loss of consciousness, weakness and headaches.  Endo/Heme/Allergies:  Negative for environmental allergies. Does not bruise/bleed easily.  Psychiatric/Behavioral:  Positive for memory loss. Negative for depression and suicidal ideas. The patient is not nervous/anxious and does not have insomnia.       Objective:    Physical Exam Constitutional:      General: She is not in acute distress.    Appearance: Normal appearance. She is not ill-appearing.  HENT:     Head: Normocephalic and atraumatic.     Right Ear: External ear normal.     Left Ear: External ear normal.  Eyes:     Extraocular Movements: Extraocular movements intact.     Pupils: Pupils are equal, round, and reactive to light.  Cardiovascular:     Rate and Rhythm: Normal rate and regular rhythm.     Pulses: Normal pulses.     Heart sounds: Normal heart sounds. No murmur heard.   No gallop.  Pulmonary:     Effort: Pulmonary effort is normal. No respiratory distress.     Breath sounds: Normal breath sounds. No wheezing, rhonchi or rales.  Abdominal:     General: Bowel sounds are normal. There is no distension.     Palpations: Abdomen is soft. There is no mass.     Tenderness: There is no abdominal tenderness. There is no guarding or rebound.     Hernia: No hernia is present.  Musculoskeletal:     Cervical back: Normal range of  motion and neck supple.  Lymphadenopathy:     Cervical: No cervical adenopathy.  Skin:    General: Skin is warm and dry.  Neurological:     Mental Status: She is alert and oriented to person, place, and time.  Psychiatric:        Behavior: Behavior normal.    BP 130/70 (BP Location: Right Arm, Patient Position: Sitting, Cuff Size: Normal)   Pulse 67   Temp 98.7 F (37.1 C) (Oral)   Resp 18   Ht 5' 2.25" (1.581 m)   Wt 190 lb 12.8 oz (86.5 kg)   SpO2 91%   BMI 34.62 kg/m  Wt Readings from Last 3 Encounters:  10/23/21 190 lb 12.8 oz (86.5 kg)  08/15/21 199 lb (90.3 kg)  01/31/21 199 lb 9.6 oz (90.5 kg)    Diabetic Foot Exam - Simple   No data filed    Lab Results  Component Value Date   WBC 8.9 10/23/2021   HGB 14.2 10/23/2021   HCT 42.7 10/23/2021   PLT 376.0 10/23/2021   GLUCOSE 215 (H) 10/23/2021   CHOL 186 10/19/2021   TRIG 140.0 10/19/2021   HDL 70.90 10/19/2021   LDLDIRECT 120.1 12/03/2013   LDLCALC 87 10/19/2021   ALT 16 10/23/2021   AST 22 10/23/2021  NA 140 10/23/2021   K 4.0 10/23/2021   CL 105 10/23/2021   CREATININE 1.00 10/23/2021   BUN 12 10/23/2021   CO2 24 10/23/2021   TSH 1.12 10/23/2021   INR 1.0 10/25/2015   HGBA1C 6.4 10/19/2021   MICROALBUR 2.7 (H) 09/22/2020    Lab Results  Component Value Date   TSH 1.12 10/23/2021   Lab Results  Component Value Date   WBC 8.9 10/23/2021   HGB 14.2 10/23/2021   HCT 42.7 10/23/2021   MCV 95.0 10/23/2021   PLT 376.0 10/23/2021   Lab Results  Component Value Date   NA 140 10/23/2021   K 4.0 10/23/2021   CO2 24 10/23/2021   GLUCOSE 215 (H) 10/23/2021   BUN 12 10/23/2021   CREATININE 1.00 10/23/2021   BILITOT 0.6 10/23/2021   ALKPHOS 58 10/23/2021   AST 22 10/23/2021   ALT 16 10/23/2021   PROT 6.8 10/23/2021   ALBUMIN 4.2 10/23/2021   CALCIUM 10.2 10/23/2021   GFR 55.12 (L) 10/23/2021   Lab Results  Component Value Date   CHOL 186 10/19/2021   Lab Results  Component Value Date    HDL 70.90 10/19/2021   Lab Results  Component Value Date   LDLCALC 87 10/19/2021   Lab Results  Component Value Date   TRIG 140.0 10/19/2021   Lab Results  Component Value Date   CHOLHDL 3 10/19/2021   Lab Results  Component Value Date   HGBA1C 6.4 10/19/2021       Assessment & Plan:   Problem List Items Addressed This Visit       Unprioritized   Bilateral hearing loss    REFER TO AUDIOLOGY      Bipolar 2 disorder    CON'T COUNSELING  CON'T PSYCH      Essential (primary) hypertension    Well controlled, no changes to meds. Encouraged heart healthy diet such as the DASH diet and exercise as tolerated.       Memory loss - Primary    REFER TO NEURO  REFER TO ccm SW        Relevant Orders   MR Brain Wo Contrast   AMB Referral to [place]   Ambulatory referral to Neurology   TSH (Completed)   Vitamin B12 (Completed)   CBC with Differential/Platelet (Completed)   Comprehensive metabolic panel (Completed)   Other Visit Diagnoses     Tremor       Relevant Orders   Ambulatory referral to Neurology   TSH (Completed)   Vitamin B12 (Completed)   CBC with Differential/Platelet (Completed)   Comprehensive metabolic panel (Completed)   Anxiety       Relevant Medications   ALPRAZolam (XANAX) 0.5 MG tablet       Meds ordered this encounter  Medications   ALPRAZolam (XANAX) 0.5 MG tablet    Sig: Take 30 min prior to procedure    Dispense:  10 tablet    Refill:  0    I,Zite Okoli,acting as a scribe for [employer], DO.,have documented all relevant documentation on the behalf of Ann Held, DO,as directed by  Ann Held, DO while in the presence of [place], DO., personally preformed the services described in this documentation.  All medical record entries made by the scribe were at my direction and in my presence.  I have reviewed the chart and discharge  instructions (  if applicable) and agree that the record reflects my personal performance and is accurate and complete. 10/23/2021

## 2021-10-23 NOTE — Telephone Encounter (Signed)
PT's daughter walked in to see if [facility] had a list Audiology places where her mom can get her hearing checked, or if that's done through a referral. She would like a call back with the advice since mychart is not currently working. [phone]. Please advice.

## 2021-10-23 NOTE — Patient Instructions (Signed)
Dementia Caregiver Guide °Dementia is a term used to describe a number of symptoms that affect memory and thinking. The most common symptoms include: °Memory loss. °Trouble with language and communication. °Trouble concentrating. °Poor judgment and problems with reasoning. °Wandering from home or public places. °Extreme anxiety or depression. °Being suspicious or having angry outbursts and accusations. °Child-like behavior and language. °Dementia can be frightening and confusing. And taking care of someone with dementia can be challenging. This guide provides tips to help you when providing care for a person with dementia. °How to help manage lifestyle changes °Dementia usually gets worse slowly over time. In the early stages, people with dementia can stay independent and safe with some help. In later stages, they need help with daily tasks such as dressing, grooming, and using the bathroom. There are actions you can take to help a person manage his or her life while living with this condition. °Communicating °When the person is talking or seems frustrated, make eye contact and hold the person's hand. °Ask specific questions that need yes or no answers. °Use simple words, short sentences, and a calm voice. Only give one direction at a time. °When offering choices, limit the person to just one or two. °Avoid correcting the person in a negative way. °If the person is struggling to find the right words, gently try to help him or her. °Preventing injury ° °Keep floors clear of clutter. Remove rugs, magazine racks, and floor lamps. °Keep hallways well lit, especially at night. °Put a handrail and nonslip mat in the bathtub or shower. °Put childproof locks on cabinets that contain dangerous items, such as medicines, alcohol, guns, toxic cleaning items, sharp tools or utensils, matches, and lighters. °For doors to the outside of the house, put the locks in places where the person cannot see or reach them easily. This will  help ensure that the person does not wander out of the house and get lost. °Be prepared for emergencies. Keep a list of emergency phone numbers and addresses in a convenient area. °Remove car keys and lock garage doors so that the person does not try to get in the car and drive. °Have the person wear a bracelet that tracks locations and identifies the person as having memory problems. This should be worn at all times for safety. °Helping with daily life ° °Keep the person on track with his or her routine. °Try to identify areas where the person may need help. °Be supportive, patient, calm, and encouraging. °Gently remind the person that adjusting to changes takes time. °Help with the tasks that the person has asked for help with. °Keep the person involved in daily tasks and decisions as much as possible. °Encourage conversation, but try not to get frustrated if the person struggles to find words or does not seem to appreciate your help. °How to recognize stress °Look for signs of stress in yourself and in the person you are caring for. If you notice signs of stress, take steps to manage it. Symptoms of stress include: °Feeling anxious, irritable, frustrated, or angry. °Denying that the person has dementia or that his or her symptoms will not improve. °Feeling depressed, hopeless, or unappreciated. °Difficulty sleeping. °Difficulty concentrating. °Developing stress-related health problems. °Feeling like you have too little time for your own life. °Follow these instructions at home: °Take care of your health °Make sure that you and the person you are caring for: °Get regular sleep. °Exercise regularly. °Eat regular, nutritious meals. °Take over-the-counter and prescription medicines only   as told by your health care providers. °Drink enough fluid to keep your urine pale yellow. °Attend all scheduled health care appointments. ° °General instructions °Join a support group with others who are caregivers. °Ask about  respite care resources. Respite care can provide short-term care for the person so that you can have a regular break from the stress of caregiving. °Consider any safety risks and take steps to avoid them. °Organize medicines in a pill box for each day of the week. °Create a plan to handle any legal or financial matters. Get legal or financial advice if needed. °Keep a calendar in a central location to remind the person of appointments or other activities. °Where to find support: °Many individuals and organizations offer support. These include: °Support groups for people with dementia. °Support groups for caregivers. °Counselors or therapists. °Home health care services. °Adult day care centers. °Where to find more information °Centers for Disease Control and Prevention: www.cdc.gov °Alzheimer's Association: www.alz.org °Family Caregiver Alliance: www.caregiver.org °Alzheimer's Foundation of [place]: www.alzfdn.org °Contact a health care provider if: °The person's health is rapidly getting worse. °You are no longer able to care for the person. °Caring for the person is affecting your physical and emotional health. °You are feeling depressed or anxious about caring for the person. °Get help right away if: °The person threatens himself or herself, you, or anyone else. °You feel depressed or sad, or feel that you want to harm yourself. °If you ever feel like your loved one may hurt himself or herself or others, or if he or she shares thoughts about taking his or her own life, get help right away. You can go to your nearest emergency department or: °Call your local emergency services (911 in the [place] °Call a suicide crisis helpline, such as the National Suicide Prevention Lifeline at 1-800-273-8255 or 988 in the [place] This is open 24 hours a day in the [place] °Text the Crisis Text Line at 741741 (in the [place] °Summary °Dementia is a term used to describe a number of symptoms that affect memory and thinking. °Dementia  usually gets worse slowly over time. °Take steps to reduce the person's risk of injury and to plan for future care. °Caregivers need support, relief from caregiving, and time for their own lives. °This information is not intended to replace advice given to you by your health care provider. Make sure you discuss any questions you have with your health care provider. °Document Revised: 05/24/2021 Document Reviewed: 03/14/2020 °Elsevier Patient Education © 2022 [employer] Inc. ° °

## 2021-10-24 ENCOUNTER — Other Ambulatory Visit: Payer: Self-pay | Admitting: Family Medicine

## 2021-10-24 ENCOUNTER — Telehealth: Payer: Self-pay | Admitting: *Deleted

## 2021-10-24 DIAGNOSIS — H9193 Unspecified hearing loss, bilateral: Secondary | ICD-10-CM

## 2021-10-24 LAB — CBC WITH DIFFERENTIAL/PLATELET
Basophils Absolute: 0.1 10*3/uL (ref 0.0–0.1)
Basophils Relative: 1 % (ref 0.0–3.0)
Eosinophils Absolute: 0.4 10*3/uL (ref 0.0–0.7)
Eosinophils Relative: 4 % (ref 0.0–5.0)
HCT: 42.7 % (ref 36.0–46.0)
Hemoglobin: 14.2 g/dL (ref 12.0–15.0)
Lymphocytes Relative: 22.1 % (ref 12.0–46.0)
Lymphs Abs: 2 10*3/uL (ref 0.7–4.0)
MCHC: 33.3 g/dL (ref 30.0–36.0)
MCV: 95 fl (ref 78.0–100.0)
Monocytes Absolute: 0.9 10*3/uL (ref 0.1–1.0)
Monocytes Relative: 10.1 % (ref 3.0–12.0)
Neutro Abs: 5.6 10*3/uL (ref 1.4–7.7)
Neutrophils Relative %: 62.8 % (ref 43.0–77.0)
Platelets: 376 10*3/uL (ref 150.0–400.0)
RBC: 4.49 Mil/uL (ref 3.87–5.11)
RDW: 14 % (ref 11.5–15.5)
WBC: 8.9 10*3/uL (ref 4.0–10.5)

## 2021-10-24 LAB — COMPREHENSIVE METABOLIC PANEL
ALT: 16 U/L (ref 0–35)
AST: 22 U/L (ref 0–37)
Albumin: 4.2 g/dL (ref 3.5–5.2)
Alkaline Phosphatase: 58 U/L (ref 39–117)
BUN: 12 mg/dL (ref 6–23)
CO2: 24 mEq/L (ref 19–32)
Calcium: 10.2 mg/dL (ref 8.4–10.5)
Chloride: 105 mEq/L (ref 96–112)
Creatinine, Ser: 1 mg/dL (ref 0.40–1.20)
GFR: 55.12 mL/min — ABNORMAL LOW (ref >60.00)
Glucose, Bld: 215 mg/dL — ABNORMAL HIGH (ref 70–99)
Potassium: 4 mEq/L (ref 3.5–5.1)
Sodium: 140 mEq/L (ref 135–145)
Total Bilirubin: 0.6 mg/dL (ref 0.2–1.2)
Total Protein: 6.8 g/dL (ref 6.0–8.3)

## 2021-10-24 LAB — TSH: TSH: 1.12 u[IU]/mL (ref 0.35–5.50)

## 2021-10-24 LAB — VITAMIN B12: Vitamin B-12: 1132 pg/mL — ABNORMAL HIGH (ref 211–911)

## 2021-10-24 NOTE — Telephone Encounter (Signed)
Noted  

## 2021-10-24 NOTE — Chronic Care Management (AMB) (Signed)
°  Chronic Care Management   Outreach Note  10/24/2021 Name: Megan Duncan MRN: [id number] DOB: [birth year]  Megan Duncan is a 75 y.o. year old female who is a primary care patient of Ann Held, DO. I reached out to Megan Duncan by phone today in response to a referral sent by [name] primary care provider.  An unsuccessful telephone outreach was attempted today. The patient was referred to the case management team for assistance with care management and care coordination.   Follow Up Plan: A HIPAA compliant phone message was left for the patient providing contact information and requesting a return call.  If patient returns call to provider office, please advise to call Embedded Care Management Care Guide Kapono Luhn at [facility] [place], [place] [facility]  Direct Dial: [phone]

## 2021-10-25 NOTE — Chronic Care Management (AMB) (Signed)
Chronic Care Management   Note  10/25/2021 Name: Megan Duncan MRN: [id number] DOB: 10-02-1946  Megan Duncan is a 75 y.o. year old female who is a primary care patient of Ann Held, DO. I reached out to [name] by phone today in response to a referral sent by Ms. [place] PCP.  [name] was given information about Chronic Care Management services today including:  CCM service includes personalized support from designated clinical staff supervised by her physician, including individualized plan of care and coordination with other care providers 24/7 contact phone numbers for assistance for urgent and routine care needs. Service will only be billed when office clinical staff spend 20 minutes or more in a month to coordinate care. Only one practitioner may furnish and bill the service in a calendar month. The patient may stop CCM services at any time (effective at the end of the month) by phone call to the office staff. The patient is responsible for co-pay (up to 20% after annual deductible is met) if co-pay is required by the individual health plan.   Patient agreed to services and verbal consent obtained.   Follow up plan: Telephone appointment with care management team member scheduled for: 11/07/2021 and 11/27/2021  Julian Hy, [place] [facility]  Direct Dial: [phone]

## 2021-10-25 NOTE — Telephone Encounter (Signed)
Pt's daughter states Staci reached out regarding referral, but direct line was not working. Please advise.

## 2021-10-25 NOTE — Telephone Encounter (Signed)
I have been talking with [name], patient's daughter, as she tries to get a handle on patient's care. She asked about a lab request that patient has misplaced. It was for a TSH and a lithium level based on what I saw in Epic. Patient saw Dr. Etter Sjogren 12/12 and had several labs drawn, one of which was a TSH. Is there a standing order for LI that doesn't require a new order? I told daughter that the sooner the Ll be drawn the better.

## 2021-10-26 ENCOUNTER — Other Ambulatory Visit: Payer: Self-pay | Admitting: Physician Assistant

## 2021-10-26 DIAGNOSIS — Z79899 Other long term (current) drug therapy: Secondary | ICD-10-CM

## 2021-10-26 DIAGNOSIS — F3181 Bipolar II disorder: Secondary | ICD-10-CM

## 2021-10-26 DIAGNOSIS — R413 Other amnesia: Secondary | ICD-10-CM | POA: Insufficient documentation

## 2021-10-26 DIAGNOSIS — H9193 Unspecified hearing loss, bilateral: Secondary | ICD-10-CM | POA: Insufficient documentation

## 2021-10-26 NOTE — Telephone Encounter (Signed)
I have reordered the lithium.  It printed up front, please mail her that order.  She has to go to a [facility] to have it done, unless Dr. Etter Sjogren will have it done in her office.  I am not sure how she does that.  And you are right sooner the better.

## 2021-10-26 NOTE — Assessment & Plan Note (Signed)
REFER TO AUDIOLOGY

## 2021-10-26 NOTE — Assessment & Plan Note (Signed)
CON'T COUNSELING  CON'T PSYCH

## 2021-10-26 NOTE — Telephone Encounter (Signed)
[name] notified of change in order and that she can have drawn at [facility] or Dr. Nonda Lou office, but that it needed to be done soon. New order mailed to patient's home.

## 2021-10-26 NOTE — Assessment & Plan Note (Signed)
REFER TO NEURO  REFER TO ccm SW

## 2021-10-26 NOTE — Assessment & Plan Note (Signed)
Well controlled, no changes to meds. Encouraged heart healthy diet such as the DASH diet and exercise as tolerated.  °

## 2021-10-27 ENCOUNTER — Other Ambulatory Visit: Payer: Self-pay | Admitting: Physician Assistant

## 2021-10-27 ENCOUNTER — Other Ambulatory Visit (INDEPENDENT_AMBULATORY_CARE_PROVIDER_SITE_OTHER): Payer: Medicare Other

## 2021-10-27 DIAGNOSIS — R739 Hyperglycemia, unspecified: Secondary | ICD-10-CM | POA: Diagnosis not present

## 2021-10-27 LAB — HEMOGLOBIN A1C: Hgb A1c MFr Bld: 6.4 % (ref 4.6–6.5)

## 2021-10-30 ENCOUNTER — Encounter: Payer: Self-pay | Admitting: Neurology

## 2021-11-02 ENCOUNTER — Telehealth: Payer: Self-pay | Admitting: Physician Assistant

## 2021-11-02 ENCOUNTER — Other Ambulatory Visit: Payer: Self-pay | Admitting: Physician Assistant

## 2021-11-02 ENCOUNTER — Other Ambulatory Visit: Payer: Self-pay

## 2021-11-02 MED ORDER — PROPRANOLOL HCL 20 MG PO TABS: 20.0000 mg | ORAL_TABLET | Freq: Three times a day (TID) | ORAL | 0 refills | Status: DC

## 2021-11-02 NOTE — Telephone Encounter (Signed)
I increased the propranolol in October. Pt stated the tremor was better after increasing the dose so I did not increase it further.

## 2021-11-02 NOTE — Telephone Encounter (Signed)
Pt's daughter called and said per her mother's last visit, her Propranolol was to be increased.  Next appt 1/25

## 2021-11-02 NOTE — Telephone Encounter (Signed)
The last note on 11/17 states "Continue Propranolol to 20 mg, 2 p.o. every morning and 1 p.o. nightly for the tremor." It says continue but did you mean increase to?

## 2021-11-02 NOTE — Telephone Encounter (Signed)
Sent updated rx

## 2021-11-07 ENCOUNTER — Other Ambulatory Visit: Payer: Self-pay | Admitting: Physician Assistant

## 2021-11-07 ENCOUNTER — Ambulatory Visit (INDEPENDENT_AMBULATORY_CARE_PROVIDER_SITE_OTHER): Payer: Medicare Other

## 2021-11-07 DIAGNOSIS — I10 Essential (primary) hypertension: Secondary | ICD-10-CM

## 2021-11-07 DIAGNOSIS — G8929 Other chronic pain: Secondary | ICD-10-CM

## 2021-11-07 NOTE — Patient Instructions (Addendum)
Visit Information   Thank you for taking time to visit with me today. Please don't hesitate to contact me if I can be of assistance to you before our next scheduled telephone appointment.  Following are the goals we discussed today:  Patient Goals/Self-Care Activities: Take medications as prescribed   Attend all scheduled provider appointments Begin to check blood sugar per provider recommendation Obtain a blood pressure monitor Encouraged patient and/or caregiver to discuss with others they may know that use personal care service agencies Review Personal Care Service Website, look at reviews and contact agencies to discuss your needs and see which agency best meets their need. Plan to received a call from the clinical LCSW, Megan Duncan. Plan to receive a call from the care guide for additional personal care resources  Our next appointment is by telephone on 11/28/2021 at 3:00 pm  Please call the care guide team at [phone] if you need to cancel or reschedule your appointment.   If you are experiencing a Mental Health or [place] or need someone to talk to, please call the Suicide and Crisis Lifeline: 988 call 1-800-273-TALK (toll free, 24 hour hotline)   Following is a copy of your full care plan:  Care Plan : [place] of Care  Updates made by Luretha Rued, RN since 11/07/2021 12:00 AM     Problem: No plan of Care Established for Managment of [place] Disease   Priority: High     Long-Range Goal: Developement of Plan of Care for Chronic Disease Management and/or care coordination needs   Start Date: 11/07/2021  Expected End Date: 05/08/2022  Priority: High  Note:   Current Barriers: 75 year old with history of HTN; DM; bilateral hearing loss-upcoming appointment with audiologist; memory loss-currently being evaluated/ has an upcoming MRI; Bipolar/Generalized Anxiety Disorder-managed by psychiatry/counseling. Megan Duncan reports history of  bronchitis, but states she was told that she does not have COPD after initially being told she did. She adds she seldom has had to use her inhaler. RNCM spoke with Megan Duncan, her husband and daughter Megan Duncan(with [name] permission) on speaker phone. Reports decreased activity for the past two years; spends most of her time in the bed and decreased mobility due to chronic low back pain, which has also affected her ability to perform ADL's. She denies any falls. Megan Duncan reports she does not use a walker and can get up and down to the bathroom independently. But adds that is about all she does. Per daughter patient has a history of arthritis and spinal stenosis. Megan Duncan acknowledges that lack of motivation is also playing a role in her decreased activity level. She is requesting resources for personal care services. Knowledge Deficits related to plan of care for management of HTN, DMII, and decreased mobility, chronic pain  Chronic Disease Management support and education needs related to HTN, DMII, and chronic pain Chronic low back pain limiting mobility Does not routinely check blood sugar levels Does not have blood pressure monitor Mental health challenges(followed by Psychiatry/counseling)  RNCM Clinical Goal(s):  Patient will verbalize understanding of plan for management of HTN and DMII as evidenced by self report, caregiver report, chart notiation take all medications exactly as prescribed and will call provider for medication related questions as evidenced by sefl report, caregiver report, chart notation    demonstrate improved health management independence as evidenced by report of health management       through collaboration with RN Care manager, provider,  and care team.   Interventions: 1:1 collaboration with primary care provider regarding development and update of comprehensive plan of care as evidenced by provider attestation and co-signature Inter-disciplinary care team  collaboration (see longitudinal plan of care) Evaluation of current treatment plan related to  self management and patient's adherence to plan as established by provider  Community Resource Need Intervention:  (Status:  New goal.)  Long Term Goal Evaluation of current treatment plan related to HTN and DMII, Mental Health Concerns  self-management and patient's adherence to plan as established by provider. Discussed plans with patient for ongoing care management follow up and provided patient with direct contact information for care management team Provided patient and/or caregiver with Personal Care Organizations and contact information about Personal Care Service (community resource)  Encouraged patient and/or caregiver to discuss with others they may know that use personal care service agencies Encouraged to review their websites, contact and to see which agency best meets their need. Care Guide referral for additional community resources  Diabetes Interventions:  (Status:  New goal.) Long Term Goal Assessed patient's understanding of A1c goal: <7% Reviewed medications with patient and discussed importance of medication adherence Discussed plans with patient for ongoing care management follow up and provided patient with direct contact information for care management team Discussed importance of checking blood sugar Lab Results  Component Value Date   HGBA1C 6.4 10/27/2021  Hypertension Interventions:  (Status:  New goal.) Long Term Goal Last practice recorded BP readings:  BP Readings from Last 3 Encounters:  10/23/21 130/70  07/13/21 130/80  01/31/21 140/90  Most recent eGFR/CrCl: No results found for: EGFR  No components found for: CRCL  Evaluation of current treatment plan related to hypertension self management and patient's adherence to plan as established by provider Reviewed medications with patient and discussed importance of compliance    Pain Interventions:  (Status:  New  goal.) Long Term Goal Pain assessment performed Medications reviewed Discussed non pharmacological strategies such as heat, rest, stretching, exercise Reviewed provider established plan for pain management Discussed importance of adherence to all scheduled medical appointments Advised patient to discuss rediscuss options with patient with provider Discussed importance of activity/exercise in maintaining good muscle tone and strength with the management of pain   Reviewed scheduled/upcoming provider appointments including   Patient Goals/Self-Care Activities: Take medications as prescribed   Attend all scheduled provider appointments Begin to check blood sugar per provider recommendation Obtain a blood pressure monitor Encouraged patient and/or caregiver to discuss with others they may know that use personal care service agencies Review Personal Care Service Website, look at reviews and contact agencies to discuss your needs and see which agency best meets their need. Plan to received a call from the clinical LCSW, Megan Duncan. Plan to receive a call from the care guide for additional personal care resources     Consent to CCM Services: Megan Duncan was given information about Chronic Care Management services including:  CCM service includes personalized support from designated clinical staff supervised by her physician, including individualized plan of care and coordination with other care providers 24/7 contact phone numbers for assistance for urgent and routine care needs. Service will only be billed when office clinical staff spend 20 minutes or more in a month to coordinate care. Only one practitioner may furnish and bill the service in a calendar month. The patient may stop CCM services at any time (effective at the end of the month) by phone call to the office staff. The  patient will be responsible for cost sharing (co-pay) of up to 20% of the service fee (after annual deductible is  met).  Patient agreed to services and verbal consent obtained.   Patient verbalizes understanding of instructions provided today and agrees to view in [place].   Telephone follow up appointment with care management team member scheduled for: 11/28/2021 at 3:00 pm  Thea Silversmith, RN, MSN, BSN, CCM Care Management Coordinator [facility] [phone]   How to Take Your Blood Pressure Blood pressure measures how strongly your blood is pressing against the walls of your arteries. Arteries are blood vessels that carry blood from your heart throughout your body. You can take your blood pressure at home with a machine. You may need to check your blood pressure at home: To check if you have high blood pressure (hypertension). To check your blood pressure over time. To make sure your blood pressure medicine is working. Supplies needed: Blood pressure machine, or monitor. Dining room chair to sit in. Table or desk. Small notebook. Pencil or pen. How to prepare Avoid these things for 30 minutes before checking your blood pressure: Having drinks with caffeine in them, such as coffee or tea. Drinking alcohol. Eating. Smoking. Exercising. Do these things five minutes before checking your blood pressure: Go to the bathroom and pee (urinate). Sit in a dining chair. Do not sit on a soft couch or an armchair. Be quiet. Do not talk. How to take your blood pressure Follow the instructions that came with your machine. If you have a digital blood pressure monitor, these may be the instructions: Sit up straight. Place your feet on the floor. Do not cross your ankles or legs. Rest your left arm at the level of your heart. You may rest it on a table, desk, or chair. Pull up your shirt sleeve. Wrap the blood pressure cuff around the upper part of your left arm. The cuff should be 1 inch (2.5 cm) above your elbow. It is best to wrap the cuff around bare skin. Fit the cuff snugly around  your arm. You should be able to place only one finger between the cuff and your arm. Place the cord so that it rests in the bend of your elbow. Press the power button. Sit quietly while the cuff fills with air and loses air. Write down the numbers on the screen. Wait 2-3 minutes and then repeat steps 1-10. What do the numbers mean? Two numbers make up your blood pressure. The first number is called systolic pressure. The second is called diastolic pressure. An example of a blood pressure reading is "120 over 80" (or 120/80). If you are an adult and do not have a medical condition, use this guide to find out if your blood pressure is normal: Normal First number: below 120. Second number: below 80. Elevated First number: 120-129. Second number: below 80. Hypertension stage 1 First number: 130-139. Second number: 80-89. Hypertension stage 2 First number: 140 or above. Second number: 11 or above. Your blood pressure is above normal even if only the first or only the second number is above normal. Follow these instructions at home: Medicines Take over-the-counter and prescription medicines only as told by your doctor. Tell your doctor if your medicine is causing side effects. General instructions Check your blood pressure as often as your doctor tells you to. Check your blood pressure at the same time every day. Take your monitor to your next doctor's appointment. Your doctor will: Make sure  you are using it correctly. Make sure it is working right. Understand what your blood pressure numbers should be. Keep all follow-up visits as told by your doctor. This is important. General tips You will need a blood pressure machine, or monitor. Your doctor can suggest a monitor. You can buy one at a drugstore or online. When choosing one: Choose one with an arm cuff. Choose one that wraps around your upper arm. Only one finger should fit between your arm and the cuff. Do not choose one that  measures your blood pressure from your wrist or finger. Where to find more information American Heart Association: www.heart.org Contact a doctor if: Your blood pressure keeps being high. Your blood pressure is suddenly low. Get help right away if: Your first blood pressure number is higher than 180. Your second blood pressure number is higher than 120. Summary Check your blood pressure at the same time every day. Avoid caffeine, alcohol, smoking, and exercise for 30 minutes before checking your blood pressure. Make sure you understand what your blood pressure numbers should be. This information is not intended to replace advice given to you by your health care provider. Make sure you discuss any questions you have with your health care provider. Document Revised: 09/07/2020 Document Reviewed: 10/23/2019 Elsevier Patient Education  2022 [employer].  Type 2 Diabetes Mellitus, Self-Care, Adult Caring for yourself after you have been diagnosed with type 2 diabetes (type 2 diabetes mellitus) means keeping your blood sugar (glucose) under control with a balance of: Nutrition. Exercise. Lifestyle changes. Medicines or insulin, if needed. Support from your team of health care providers and others. What are the risks? Having type 2 diabetes can put you at risk for other long-term (chronic) conditions, such as heart disease and kidney disease. Your health care provider may prescribe medicines to help prevent complications from diabetes. How to monitor your blood glucose  Check your blood glucose every day or as often as told by your health care provider. Have your A1C (hemoglobin A1C) level checked two or more times a year, or as often as told by your health care provider. Your health care provider will set personalized treatment goals for you. Generally, the goal of treatment is to maintain the following blood glucose levels: Before meals: 80-130 mg/dL (4.4-7.2 mmol/L). After meals: below  180 mg/dL (10 mmol/L). A1C level: less than 7%. How to manage hyperglycemia and hypoglycemia Hyperglycemia symptoms Hyperglycemia, also called high blood glucose, occurs when blood glucose is too high. Make sure you know the early signs of hyperglycemia, such as: Increased thirst. Hunger. Feeling very tired. Needing to urinate more often than usual. Blurry vision. Hypoglycemia symptoms Hypoglycemia, also called low blood glucose, occurs with a blood glucose level at or below 70 mg/dL (3.9 mmol/L). Diabetes medicines lower your blood glucose and can cause hypoglycemia. The risk for hypoglycemia increases during or after exercise, during sleep, during illness, and when skipping meals or not eating for a long time (fasting). It is important to know the symptoms of hypoglycemia and treat it right away. Always have a 15-gram rapid-acting carbohydrate snack with you to treat low blood glucose. Family members and close friends should also know the symptoms and understand how to treat hypoglycemia, in case you are not able to treat yourself. Symptoms may include: Hunger. Anxiety. Sweating and feeling clammy. Dizziness or feeling light-headed. Sleepiness. Increased heart rate. Irritability. Tingling or numbness around the mouth, lips, or tongue. Restless sleep. Severe hypoglycemia is when your blood glucose level  is at or below 54 mg/dL (3 mmol/L). Severe hypoglycemia is an emergency. Do not wait to see if the symptoms will go away. Get medical help right away. Call your local emergency services (911 in the [place] Do not drive yourself to the hospital. If you have severe hypoglycemia and you cannot eat or drink, you may need glucagon. A family member or close friend should learn how to check your blood glucose and how to give you glucagon. Ask your health care provider if you need to have an emergency glucagon kit available. Follow these instructions at home: Medicines Take prescribed insulin or  diabetes medicines as told by your health care provider. Do not run out of insulin or other diabetes medicines. Plan ahead so you always have these available. If you use insulin, adjust your dosage based on your physical activity and what foods you eat. Your health care provider will tell you how to adjust your dosage. Take over-the-counter and prescription medicines only as told by your health care provider. Eating and drinking What you eat and drink affects your blood glucose and your insulin dosage. Making good choices helps to control your diabetes and prevent other health problems. A healthy meal plan includes eating lean proteins, complex carbohydrates, fresh fruits and vegetables, low-fat dairy products, and healthy fats. Make an appointment to see a registered dietitian to help you create an eating plan that is right for you. Make sure that you: Follow instructions from your health care provider about eating or drinking restrictions. Drink enough fluid to keep your urine pale yellow. Keep a record of the carbohydrates that you eat. Do this by reading food labels and learning the standard serving sizes of foods. Follow your sick-day plan whenever you cannot eat or drink as usual. Make this plan in advance with your health care provider.  Activity Stay active. Exercise regularly, as told by your health care provider. This may include: Stretching and doing strength exercises, such as yoga or weight lifting, two or more times a week. Doing 150 minutes or more of moderate-intensity or vigorous-intensity exercise each week. This could be brisk walking, biking, or water aerobics. Spread out your activity over 3 or more days of the week. Do not go more than 2 days in a row without doing some kind of physical activity. When you start a new exercise or activity, work with your health care provider to adjust your insulin, medicines, or food intake as needed. Lifestyle Do not use any products that  contain nicotine or tobacco. These products include cigarettes, chewing tobacco, and vaping devices, such as e-cigarettes. If you need help quitting, ask your health care provider. If you drink alcohol and your health care provider says that it is safe for you: Limit how much you have to: 0-1 drink a day for women who are not pregnant. 0-2 drinks a day for men. Know how much alcohol is in your drink. In the [place] one drink equals one 12 oz bottle of beer (355 mL), one 5 oz glass of wine (148 mL), or one 1 oz glass of hard liquor (44 mL). Learn to manage stress. If you need help with this, ask your health care provider. Take care of your body  Keep your immunizations up to date. In addition to getting vaccinations as told by your health care provider, it is recommended that you get vaccinated against the following illnesses: The flu (influenza). Get a flu shot every year. Pneumonia. Hepatitis B. Schedule an eye exam soon  after your diagnosis, and then one time every year after that. Check your skin and feet every day for cuts, bruises, redness, blisters, or sores. Schedule a foot exam with your health care provider once every year. Brush your teeth and gums two times a day, and floss one or more times a day. Visit your dentist one or more times every 6 months. Maintain a healthy weight. General instructions Share your diabetes management plan with people in your workplace, school, and household. Carry a medical alert card or wear medical alert jewelry. Keep all follow-up visits. This is important. Questions to ask your health care provider Should I meet with a certified diabetes care and education specialist? Where can I find a support group for people with diabetes? Where to find more information For help and guidance and for more information about diabetes, please visit: American Diabetes Association (ADA): www.diabetes.org American Association of Diabetes Care and Education Specialists  (ADCES): www.diabeteseducator.org International Diabetes Federation (IDF): MemberVerification.ca Summary Caring for yourself after you have been diagnosed with type 2 diabetes (type 2 diabetes mellitus) means keeping your blood sugar (glucose) under control with a balance of nutrition, exercise, lifestyle changes, and medicine. Check your blood glucose every day, as often as told by your health care provider. Having diabetes can put you at risk for other long-term (chronic) conditions, such as heart disease and kidney disease. Your health care provider may prescribe medicines to help prevent complications from diabetes. Share your diabetes management plan with people in your workplace, school, and household. Keep all follow-up visits. This is important. This information is not intended to replace advice given to you by your health care provider. Make sure you discuss any questions you have with your health care provider. Document Revised: 03/29/2021 Document Reviewed: 03/29/2021 Elsevier Patient Education  [place].

## 2021-11-07 NOTE — Chronic Care Management (AMB) (Signed)
Chronic Care Management   CCM RN Visit Note  11/07/2021 Name: Megan Duncan MRN: [id number] DOB: [birth year]  Subjective: Megan Duncan is a 75 y.o. year old female who is a primary care patient of Ann Held, DO. The care management team was consulted for assistance with disease management and care coordination needs.    Engaged with patient by telephone for initial visit in response to provider referral for case management and/or care coordination services.   Consent to Services:  The patient was given the following information about Chronic Care Management services today, agreed to services, and gave verbal consent: 1. CCM service includes personalized support from designated clinical staff supervised by the primary care provider, including individualized plan of care and coordination with other care providers 2. 24/7 contact phone numbers for assistance for urgent and routine care needs. 3. Service will only be billed when office clinical staff spend 20 minutes or more in a month to coordinate care. 4. Only one practitioner may furnish and bill the service in a calendar month. 5.The patient may stop CCM services at any time (effective at the end of the month) by phone call to the office staff. 6. The patient will be responsible for cost sharing (co-pay) of up to 20% of the service fee (after annual deductible is met). Patient agreed to services and consent obtained.  Patient agreed to services and verbal consent obtained.   Assessment: Review of patient past medical history, allergies, medications, health status, including review of consultants reports, laboratory and other test data, was performed as part of comprehensive evaluation and provision of chronic care management services.   SDOH (Social Determinants of Health) assessments and interventions performed:  SDOH Interventions    Flowsheet Row Most Recent Value  SDOH Interventions   Food Insecurity Interventions  Intervention Not Indicated  Transportation Interventions Intervention Not Indicated        CCM Care Plan  Allergies  Allergen Reactions   Atorvastatin Other (See Comments)    Significant rise in liver tests   Codeine Nausea And Vomiting   Diclofenac Other (See Comments)    Elevated LFT's   Doxycycline     Extreme nausea   Sulfonamide Derivatives Swelling    Outpatient Encounter Medications as of 11/07/2021  Medication Sig Note   albuterol (VENTOLIN HFA) 108 (90 Base) MCG/ACT inhaler Inhale 2 puffs into the lungs every 4 (four) hours as needed. For shortness of breath and wheezing    amLODipine (NORVASC) 5 MG tablet TAKE 1 TABLET (5 MG TOTAL) BY MOUTH DAILY.    clorazepate (TRANXENE) 3.75 MG tablet Take 1 tablet (3.75 mg total) by mouth 2 (two) times daily as needed for anxiety or sleep.    DULoxetine (CYMBALTA) 30 MG capsule Take 1 capsule (30 mg total) by mouth daily. For 2 weeks, then start 60 mg daily.    levothyroxine (SYNTHROID) 50 MCG tablet TAKE 1 TABLET BY MOUTH DAILY BEFORE BREAKFAST    lithium carbonate 300 MG capsule TAKE 2 CAPSULES (600 MG) BY MOUTH AT BEDTIME, WITH 150 MG CAPSULE    metFORMIN (GLUCOPHAGE) 1000 MG tablet Take 1 tablet (1,000 mg total) by mouth 2 (two) times daily with a meal.    propranolol (INDERAL) 20 MG tablet Take 1 tablet (20 mg total) by mouth 3 (three) times daily. 11/07/2021: Reports takes 40 mg in the morning and 20 mg in the evening   traZODone (DESYREL) 50 MG tablet TAKE 0.5-2 TABLETS BY MOUTH AT BEDTIME AS NEEDED  FOR SLEEP.    ALPRAZolam (XANAX) 0.5 MG tablet Take 30 min prior to procedure    blood glucose meter kit and supplies Dispense based on patient and insurance preference. Use once a day.  DX Code: E11.9    cephALEXin (KEFLEX) 500 MG capsule Take 1 capsule (500 mg total) by mouth 2 (two) times daily. (Patient not taking: Reported on 08/15/2021)    DULoxetine (CYMBALTA) 60 MG capsule TAKE 1 CAPSULE BY MOUTH EVERY DAY IN THE MORNING,  after taking 30 mg for 2 weeks.    glucose blood test strip Use as instructed once a day.  Dx Code: E11.9    hydrocortisone-pramoxine (ANALPRAM HC) 2.5-1 % rectal cream Place 1 application rectally 3 (three) times daily. (Patient not taking: Reported on 11/07/2021)    Lancets MISC Use as directed once a day.  Dx code: E11.9    lithium carbonate 150 MG capsule TAKE 1 CAPSULE (150 MG TOTAL) BY MOUTH DAILY, TAKE WITH LITHIUM 600 MG TO EQUAL 750 MG TOTAL    metFORMIN (GLUCOPHAGE) 500 MG tablet TAKE 1 TABLET BY MOUTH 2 TIMES DAILY WITH A MEAL.    No facility-administered encounter medications on file as of 11/07/2021.    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   Degenerative lumbar spinal stenosis    Anterolisthesis of lumbar spine    Lump in upper inner quadrant of left breast 07/13/2021   Left flank pain 07/13/2021   Lumbar radiculopathy 01/26/2021   Body mass index (BMI) 36.0-36.9, adult 01/11/2021   Degeneration of lumbar intervertebral disc 08/22/2020   Retrolisthesis of vertebrae 08/22/2020   Foraminal stenosis of lumbar region 08/22/2020   Generalized anxiety disorder 09/01/2018   Type II diabetes mellitus 06/05/2018   Tinea corporis 09/01/2017   Left upper quadrant pain 09/01/2017   Estrogen deficiency 09/01/2017   Thoracic aortic atherosclerosis 05/14/2017   COPD (chronic obstructive pulmonary disease) 10/30/2016   Allergic rhinitis 10/03/2016   Palpitations 07/20/2016   DOE (dyspnea on exertion) 07/20/2016   External hemorrhoid 08/01/2015   Stress incontinence, female 08/01/2015   Dermatitis 06/02/2015   Shingles outbreak 04/14/2015   Hyperlipidemia 01/13/2015   Fibromyalgia 10/14/2014   Bilateral hip pain 09/02/2014   Deformity of finger 07/15/2014   Ceruminosis 03/04/2014   Tinnitus 03/04/2014   Torticollis 03/04/2014    Bipolar 2 disorder 12/03/2013   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-sided sciatica 08/06/2012   Lower abdominal pain 08/06/2012   Otitis media 09/05/2010   Peripheral neuropathy 02/22/2010   Lumbar back pain with radiculopathy affecting left lower extremity 01/24/2010   Cervicalgia 06/20/2007   Chronic low back pain 06/20/2007   Essential (primary) hypertension 04/23/2007   Inflammatory disease of uterus 04/23/2007    Conditions to be addressed/monitored:HTN and DMII  Care Plan : RN Care Manager Plan of Care  Updates made by Megan Rued, RN since 11/07/2021 12:00 AM     Problem: No plan of Care Established for Managment of Chrinic Disease   Priority: High     Long-Range Goal: Developement of Plan of Care for Chronic Disease Management and/or care coordination needs   Start Date: 11/07/2021  Expected End Date: 05/08/2022  Priority: High  Note:   Current Barriers: 75 year old with history of HTN; DM; bilateral hearing loss-upcoming appointment with audiologist; memory loss-currently being evaluated/ has an upcoming MRI; Bipolar/Generalized Anxiety  Disorder-managed by psychiatry/counseling. Megan Duncan reports history of bronchitis, but states she was told that she does not have COPD after initially being told she did. She adds she seldom has had to use her inhaler. RNCM spoke with Megan Duncan, her husband and daughter Megan Duncan(with Megan Duncan permission) on speaker phone. Reports decreased activity for the past two years; spends most of her time in the bed and decreased mobility due to chronic low back pain, which has also affected her ability to perform ADL's. She denies any falls. Megan Duncan reports she does not use a walker and can get up and down to the bathroom independently. But adds that is about all she does. Per daughter patient has a history of arthritis and spinal stenosis. Megan Duncan acknowledges that lack of motivation is also playing a role in her decreased activity  level. She is requesting resources for personal care services. Knowledge Deficits related to plan of care for management of HTN, DMII, and decreased mobility, chronic pain  Chronic Disease Management support and education needs related to HTN, DMII, and chronic pain Chronic low back pain limiting mobility Does not routinely check blood sugar levels Does not have blood pressure monitor Mental health challenges(followed by Psychiatry/counseling)  RNCM Clinical Goal(s):  Patient will verbalize understanding of plan for management of HTN and DMII as evidenced by self report, caregiver report, chart notiation take all medications exactly as prescribed and will call provider for medication related questions as evidenced by sefl report, caregiver report, chart notation    demonstrate improved health management independence as evidenced by report of health management       through collaboration with RN Care manager, provider, and care team.   Interventions: 1:1 collaboration with primary care provider regarding development and update of comprehensive plan of care as evidenced by provider attestation and co-signature Inter-disciplinary care team collaboration (see longitudinal plan of care) Evaluation of current treatment plan related to  self management and patient's adherence to plan as established by provider  Community Resource Need Intervention:  (Status:  New goal.)  Long Term Goal Evaluation of current treatment plan related to HTN and DMII, Mental Health Concerns  self-management and patient's adherence to plan as established by provider. Discussed plans with patient for ongoing care management follow up and provided patient with direct contact information for care management team Provided patient and/or caregiver with Personal Care Organizations and contact information about Personal Care Service (community resource)  Encouraged patient and/or caregiver to discuss with others they may know that  use personal care service agencies Encouraged to review their websites, contact and to see which agency best meets their need. Care Guide referral for additional community resources  Diabetes Interventions:  (Status:  New goal.) Long Term Goal Assessed patient's understanding of A1c goal: <7% Reviewed medications with patient and discussed importance of medication adherence Discussed plans with patient for ongoing care management follow up and provided patient with direct contact information for care management team Discussed importance of checking blood sugar Lab Results  Component Value Date   HGBA1C 6.4 10/27/2021  Hypertension Interventions:  (Status:  New goal.) Long Term Goal Last practice recorded BP readings:  BP Readings from Last 3 Encounters:  10/23/21 130/70  07/13/21 130/80  01/31/21 140/90  Most recent eGFR/CrCl: No results found for: EGFR  No components found for: CRCL  Evaluation of current treatment plan related to hypertension self management and patient's adherence to plan as established by provider Reviewed medications with patient and discussed importance  of compliance    Pain Interventions:  (Status:  New goal.) Long Term Goal Pain assessment performed Medications reviewed Discussed non pharmacological strategies such as heat, rest, stretching, exercise Reviewed provider established plan for pain management Discussed importance of adherence to all scheduled medical appointments Advised patient to discuss rediscuss options with patient with provider Discussed importance of activity/exercise in maintaining good muscle tone and strength with the management of pain   Reviewed scheduled/upcoming provider appointments including   Patient Goals/Self-Care Activities: Take medications as prescribed   Attend all scheduled provider appointments Begin to check blood sugar per provider recommendation Obtain a blood pressure monitor Encouraged patient and/or caregiver to  discuss with others they may know that use personal care service agencies Review Personal Care Service Website, look at reviews and contact agencies to discuss your needs and see which agency best meets their need. Plan to received a call from the clinical LCSW, J. Saporito. Plan to receive a call from the care guide for additional personal care resources   Plan:Telephone follow up appointment with care management team member scheduled for:  11/28/21  Thea Silversmith, RN, MSN, BSN, CCM Care Management Coordinator [facility] [phone]

## 2021-11-10 ENCOUNTER — Other Ambulatory Visit: Payer: Medicare Other

## 2021-11-11 DIAGNOSIS — Z7984 Long term (current) use of oral hypoglycemic drugs: Secondary | ICD-10-CM

## 2021-11-11 DIAGNOSIS — I10 Essential (primary) hypertension: Secondary | ICD-10-CM

## 2021-11-11 DIAGNOSIS — E1159 Type 2 diabetes mellitus with other circulatory complications: Secondary | ICD-10-CM | POA: Diagnosis not present

## 2021-11-16 ENCOUNTER — Other Ambulatory Visit: Payer: Medicare Other

## 2021-11-17 ENCOUNTER — Other Ambulatory Visit: Payer: Self-pay | Admitting: Physician Assistant

## 2021-11-21 ENCOUNTER — Other Ambulatory Visit: Payer: Self-pay | Admitting: Physician Assistant

## 2021-11-21 ENCOUNTER — Other Ambulatory Visit: Payer: Self-pay | Admitting: Family Medicine

## 2021-11-21 DIAGNOSIS — Z79899 Other long term (current) drug therapy: Secondary | ICD-10-CM

## 2021-11-24 ENCOUNTER — Telehealth: Payer: Self-pay | Admitting: Family Medicine

## 2021-11-24 NOTE — Telephone Encounter (Signed)
Pt daughter would like for someone to return her phone call regarding pt. Mom is very nauseas to the point where she does not want to eat anything. Pt would like for someone to return her call to discuss meds and to have another inhaler prescribed for the weekend. Please advise.

## 2021-11-24 NOTE — Telephone Encounter (Signed)
Pt daughter called again regarding mom's meds and inhaler. Please advise.

## 2021-11-25 ENCOUNTER — Emergency Department (HOSPITAL_BASED_OUTPATIENT_CLINIC_OR_DEPARTMENT_OTHER): Payer: Medicare Other

## 2021-11-25 ENCOUNTER — Encounter (HOSPITAL_BASED_OUTPATIENT_CLINIC_OR_DEPARTMENT_OTHER): Payer: Self-pay | Admitting: Emergency Medicine

## 2021-11-25 ENCOUNTER — Other Ambulatory Visit: Payer: Self-pay

## 2021-11-25 ENCOUNTER — Emergency Department (HOSPITAL_BASED_OUTPATIENT_CLINIC_OR_DEPARTMENT_OTHER)
Admission: EM | Admit: 2021-11-25 | Discharge: 2021-11-25 | Disposition: A | Discharge status: Home / Self Care | Payer: Medicare Other | Attending: Emergency Medicine | Admitting: Emergency Medicine

## 2021-11-25 DIAGNOSIS — Z20822 Contact with and (suspected) exposure to covid-19: Secondary | ICD-10-CM | POA: Insufficient documentation

## 2021-11-25 DIAGNOSIS — R001 Bradycardia, unspecified: Secondary | ICD-10-CM | POA: Insufficient documentation

## 2021-11-25 DIAGNOSIS — R112 Nausea with vomiting, unspecified: Secondary | ICD-10-CM | POA: Diagnosis not present

## 2021-11-25 DIAGNOSIS — R110 Nausea: Secondary | ICD-10-CM | POA: Diagnosis not present

## 2021-11-25 DIAGNOSIS — R000 Tachycardia, unspecified: Secondary | ICD-10-CM | POA: Diagnosis not present

## 2021-11-25 DIAGNOSIS — R251 Tremor, unspecified: Secondary | ICD-10-CM | POA: Insufficient documentation

## 2021-11-25 DIAGNOSIS — R0602 Shortness of breath: Secondary | ICD-10-CM | POA: Diagnosis not present

## 2021-11-25 LAB — CBC WITH DIFFERENTIAL/PLATELET
Abs Immature Granulocytes: 0.03 10*3/uL (ref 0.00–0.07)
Basophils Absolute: 0.1 10*3/uL (ref 0.0–0.1)
Basophils Relative: 1 %
Eosinophils Absolute: 0.4 10*3/uL (ref 0.0–0.5)
Eosinophils Relative: 4 %
HCT: 45.9 % (ref 36.0–46.0)
Hemoglobin: 15.2 g/dL — ABNORMAL HIGH (ref 12.0–15.0)
Immature Granulocytes: 0 %
Lymphocytes Relative: 25 %
Lymphs Abs: 2.4 10*3/uL (ref 0.7–4.0)
MCH: 31.5 pg (ref 26.0–34.0)
MCHC: 33.1 g/dL (ref 30.0–36.0)
MCV: 95 fL (ref 80.0–100.0)
Monocytes Absolute: 1.1 10*3/uL — ABNORMAL HIGH (ref 0.1–1.0)
Monocytes Relative: 11 %
Neutro Abs: 5.7 10*3/uL (ref 1.7–7.7)
Neutrophils Relative %: 59 %
Platelets: 376 10*3/uL (ref 150–400)
RBC: 4.83 MIL/uL (ref 3.87–5.11)
RDW: 13.8 % (ref 11.5–15.5)
WBC: 9.7 10*3/uL (ref 4.0–10.5)
nRBC: 0 % (ref 0.0–0.2)

## 2021-11-25 LAB — COMPREHENSIVE METABOLIC PANEL
ALT: 21 U/L (ref 0–44)
AST: 34 U/L (ref 15–41)
Albumin: 4.3 g/dL (ref 3.5–5.0)
Alkaline Phosphatase: 71 U/L (ref 38–126)
Anion gap: 9 (ref 5–15)
BUN: 7 mg/dL — ABNORMAL LOW (ref 8–23)
CO2: 21 mmol/L — ABNORMAL LOW (ref 22–32)
Calcium: 9.7 mg/dL (ref 8.9–10.3)
Chloride: 108 mmol/L (ref 98–111)
Creatinine, Ser: 0.82 mg/dL (ref 0.44–1.00)
GFR, Estimated: >60 mL/min (ref >60)
Glucose, Bld: 150 mg/dL — ABNORMAL HIGH (ref 70–99)
Potassium: 4.1 mmol/L (ref 3.5–5.1)
Sodium: 138 mmol/L (ref 135–145)
Total Bilirubin: 0.8 mg/dL (ref 0.3–1.2)
Total Protein: 7.6 g/dL (ref 6.5–8.1)

## 2021-11-25 LAB — MAGNESIUM: Magnesium: 2.1 mg/dL (ref 1.7–2.4)

## 2021-11-25 LAB — LITHIUM LEVEL: Lithium Lvl: 0.75 mmol/L (ref 0.60–1.20)

## 2021-11-25 LAB — RESP PANEL BY RT-PCR (FLU A&B, COVID) ARPGX2
Influenza A by PCR: NEGATIVE
Influenza B by PCR: NEGATIVE
SARS Coronavirus 2 by RT PCR: NEGATIVE

## 2021-11-25 LAB — LIPASE, BLOOD: Lipase: 30 U/L (ref 11–51)

## 2021-11-25 LAB — TROPONIN I (HIGH SENSITIVITY): Troponin I (High Sensitivity): 4 ng/L (ref <18)

## 2021-11-25 MED ORDER — ONDANSETRON HCL 4 MG PO TABS: 4.0000 mg | ORAL_TABLET | Freq: Three times a day (TID) | ORAL | 0 refills | Status: DC | PRN

## 2021-11-25 NOTE — ED Notes (Signed)
ED Provider at bedside. 

## 2021-11-25 NOTE — ED Notes (Signed)
XR at bedside

## 2021-11-25 NOTE — ED Provider Notes (Signed)
[facility] EMERGENCY DEPARTMENT Provider Note   CSN: [id number] Arrival date & time: 11/25/21  1354     History  Chief Complaint  Patient presents with   Nausea    Megan Duncan is a 76 y.o. female.  She is brought in by family member is helping with a history.  Here with 1 week of nausea.  Also noticing some tremor in her hands.  Intermittently waking up at night with shortness of breath and chest tightness.  No new medications.  They talk to primary care doctor who recommended she come here and get a lithium level checked.  Has more chronic back problems which causes her to be minimally ambulatory.  Mostly bedbound.  Denies any current chest pain shortness of breath abdominal pain nausea vomiting diarrhea constipation or urinary symptoms.  No headache.  No numbness or weakness.  No fevers or chills.  HPI     Home Medications Prior to Admission medications   Medication Sig Start Date End Date Taking? Authorizing Provider  albuterol (VENTOLIN HFA) 108 (90 Base) MCG/ACT inhaler Inhale 2 puffs into the lungs every 4 (four) hours as needed. For shortness of breath and wheezing 07/09/18   Collene Gobble, MD  ALPRAZolam Duanne Moron) 0.5 MG tablet Take 30 min prior to procedure 10/23/21   Carollee Herter, Kendrick Fries R, DO  amLODipine (NORVASC) 5 MG tablet TAKE 1 TABLET (5 MG TOTAL) BY MOUTH DAILY. 06/05/21   Roma Schanz R, DO  blood glucose meter kit and supplies Dispense based on patient and insurance preference. Use once a day.  DX Code: E11.9 04/15/18   Carollee Herter, Alferd Apa, DO  cephALEXin (KEFLEX) 500 MG capsule Take 1 capsule (500 mg total) by mouth 2 (two) times daily. Patient not taking: Reported on 08/15/2021 07/13/21   Carollee Herter, Alferd Apa, DO  clorazepate (TRANXENE) 3.75 MG tablet Take 1 tablet (3.75 mg total) by mouth 2 (two) times daily as needed for anxiety or sleep. 06/21/21   Donnal Moat T, PA-C  DULoxetine (CYMBALTA) 30 MG capsule Take 1 capsule (30 mg total) by mouth  daily. For 2 weeks, then start 60 mg daily. 10/09/21   Donnal Moat T, PA-C  DULoxetine (CYMBALTA) 60 MG capsule TAKE 1 CAPSULE BY MOUTH EVERY DAY IN THE MORNING, after taking 30 mg for 2 weeks. 10/09/21   Donnal Moat T, PA-C  glucose blood test strip Use as instructed once a day.  Dx Code: E11.9 06/20/20   Carollee Herter, Alferd Apa, DO  hydrocortisone-pramoxine (ANALPRAM HC) 2.5-1 % rectal cream Place 1 application rectally 3 (three) times daily. Patient not taking: Reported on 11/07/2021 08/01/15   Midge Minium, MD  Lancets MISC Use as directed once a day.  Dx code: E11.9 01/11/20   Carollee Herter, Alferd Apa, DO  levothyroxine (SYNTHROID) 50 MCG tablet TAKE 1 TABLET BY MOUTH DAILY BEFORE BREAKFAST 06/19/21   Carollee Herter, Kendrick Fries R, DO  lithium carbonate 150 MG capsule TAKE 1 CAPSULE (150 MG TOTAL) BY MOUTH DAILY, TAKE WITH LITHIUM 600 MG TO EQUAL 750 MG TOTAL 11/08/21   Donnal Moat T, PA-C  lithium carbonate 300 MG capsule TAKE 2 CAPSULES (600 MG) BY MOUTH AT BEDTIME, WITH 150 MG CAPSULE 03/24/21   Hurst, [name] T, PA-C  metFORMIN (GLUCOPHAGE) 1000 MG tablet Take 1 tablet (1,000 mg total) by mouth 2 (two) times daily with a meal. 07/21/21   Carollee Herter, Alferd Apa, DO  metFORMIN (GLUCOPHAGE) 500 MG tablet TAKE 1 TABLET BY  MOUTH 2 TIMES DAILY WITH A MEAL. 06/05/21   Carollee Herter, Alferd Apa, DO  propranolol (INDERAL) 20 MG tablet Take 1 tablet (20 mg total) by mouth 3 (three) times daily. 11/02/21   Donnal Moat T, PA-C  traZODone (DESYREL) 50 MG tablet TAKE 1/2 TO 2 TABLETS BY MOUTH EVERY DAY AT BEDTIME AS NEEDED FOR SLEEP 11/17/21   Donnal Moat T, PA-C      Allergies    Atorvastatin, Codeine, Diclofenac, Doxycycline, and Sulfonamide derivatives    Review of Systems   Review of Systems  Constitutional:  Negative for fever.  HENT:  Negative for sore throat.   Eyes:  Negative for visual disturbance.  Respiratory:  Negative for shortness of breath.   Cardiovascular:  Negative for chest pain.   Gastrointestinal:  Positive for nausea. Negative for abdominal pain, constipation, diarrhea and vomiting.  Genitourinary:  Negative for dysuria.  Musculoskeletal:  Positive for back pain.  Skin:  Negative for rash.  Neurological:  Positive for tremors. Negative for headaches.   Physical Exam Updated Vital Signs BP (!) 134/91 (BP Location: Left Arm)    Pulse (!) 50    Temp 98.3 F (36.8 C) (Oral)    Resp 15    Ht '5\' 2"'  (1.575 m)    Wt 81.6 kg    SpO2 95%    BMI 32.92 kg/m  Physical Exam Vitals and nursing note reviewed.  Constitutional:      General: She is not in acute distress.    Appearance: Normal appearance. She is well-developed.  HENT:     Head: Normocephalic and atraumatic.  Eyes:     Conjunctiva/sclera: Conjunctivae normal.  Cardiovascular:     Rate and Rhythm: Normal rate and regular rhythm.     Heart sounds: No murmur heard. Pulmonary:     Effort: Pulmonary effort is normal. No respiratory distress.     Breath sounds: Normal breath sounds. No stridor. No wheezing.  Abdominal:     Palpations: Abdomen is soft.     Tenderness: There is no abdominal tenderness. There is no guarding or rebound.  Musculoskeletal:        General: No tenderness or deformity. Normal range of motion.     Cervical back: Neck supple.  Skin:    General: Skin is warm and dry.  Neurological:     General: No focal deficit present.     Mental Status: She is alert.     GCS: GCS eye subscore is 4. GCS verbal subscore is 5. GCS motor subscore is 6.     Sensory: No sensory deficit.     Motor: No weakness.    ED Results / Procedures / Treatments   Labs (all labs ordered are listed, but only abnormal results are displayed) Labs Reviewed  COMPREHENSIVE METABOLIC PANEL - Abnormal; Notable for the following components:      Result Value   CO2 21 (*)    Glucose, Bld 150 (*)    BUN 7 (*)    All other components within normal limits  CBC WITH DIFFERENTIAL/PLATELET - Abnormal; Notable for the  following components:   Hemoglobin 15.2 (*)    Monocytes Absolute 1.1 (*)    All other components within normal limits  RESP PANEL BY RT-PCR (FLU A&B, COVID) ARPGX2  LIPASE, BLOOD  LITHIUM LEVEL  MAGNESIUM  TROPONIN I (HIGH SENSITIVITY)  TROPONIN I (HIGH SENSITIVITY)    EKG EKG Interpretation  Date/Time:  Saturday November 25 2021 14:34:21 EST Ventricular Rate:  46  PR Interval:  162 QRS Duration: 70 QT Interval:  486 QTC Calculation: 425 R Axis:   -76 Text Interpretation: Sinus bradycardia with sinus arrhythmia Left axis deviation Pulmonary disease pattern Nonspecific T wave abnormality Abnormal ECG No previous ECGs available Confirmed by Aletta Edouard [phone]) on 11/25/2021 3:03:04 PM  Radiology DG Chest Port 1 View  Result Date: 11/25/2021 CLINICAL DATA:  Short of breath.  Nausea. EXAM: PORTABLE CHEST 1 VIEW COMPARISON:  05/13/2017. FINDINGS: Cardiac silhouette is normal in size. No mediastinal or hilar masses. Clear lungs.  No convincing pleural effusion.  No pneumothorax. Skeletal structures are grossly intact. IMPRESSION: No active disease. Electronically Signed   By: Lajean Manes M.D.   On: 11/25/2021 15:58    Procedures Procedures    Medications Ordered in ED Medications - No data to display  ED Course/ Medical Decision Making/ A&P Clinical Course as of 11/26/21 0955  Sat Nov 25, 2021  1557 Was informed by the nurse that the patient family member want to leave.  They understand that the tests are not fully resulted yet.She has an appointment with her primary care doctor in 2 days.  They will follow-up with the lab work there. [MB]    Clinical Course User Index [MB] Hayden Rasmussen, MD                           Medical Decision Making  This patient complains of nausea, tremor, shortness of breath, palpitations; this involves an extensive number of treatment Options and is a complaint that carries with it a high risk of complications and Morbidity. The  differential includes arrhythmia, ACS, pneumonia, pneumothorax, metabolic derangement, dehydration, lithium toxicity  I ordered, reviewed and interpreted labs, which included CBC with normal white count normal hemoglobin, chemistries fairly normal other than low bicarb, COVID and flu negative, lithium therapeutic, troponin unremarkable  I ordered imaging studies which included chest x-ray and I independently    visualized and interpreted imaging which showed no acute infiltrates Additional history obtained from patient's son Previous records obtained and reviewed in epic including prior primary care visits  After the interventions stated above, I reevaluated the patient and found patient to be hemodynamically stable.  She is asking to be discharged prior to results of final lab work.  She understands there could be some significant findings in her labs that might alter her treatment.  She states she will follow-up with her primary care doctor as scheduled.  Son in agreement with her plan.         Final Clinical Impression(s) / ED Diagnoses Final diagnoses:  Nausea  Tremor  SOB (shortness of breath)    Rx / DC Orders ED Discharge Orders     None         Hayden Rasmussen, MD 11/26/21 [phone]

## 2021-11-25 NOTE — Discharge Instructions (Addendum)
You were seen in the emergency department for evaluation of nausea tremor and shortness of breath.  You left prior to the completion of your work-up.  Please follow-up with your primary care doctor on Monday as scheduled.  She will be able to review the results of your testing and make adjustments as needed.  We are prescribing you some nausea medication.  Return to the emergency department if any worsening or concerning symptoms

## 2021-11-25 NOTE — ED Triage Notes (Addendum)
Pt reports nausea and SHOB; also reported felt like her heart was fluttering; sts she was referred here to get a Lithium level checked; pt seems confused, but husband sts this is not new

## 2021-11-27 ENCOUNTER — Ambulatory Visit (INDEPENDENT_AMBULATORY_CARE_PROVIDER_SITE_OTHER): Payer: Medicare Other | Admitting: *Deleted

## 2021-11-27 ENCOUNTER — Telehealth: Payer: Self-pay | Admitting: Physician Assistant

## 2021-11-27 ENCOUNTER — Telehealth: Payer: Self-pay

## 2021-11-27 ENCOUNTER — Ambulatory Visit (INDEPENDENT_AMBULATORY_CARE_PROVIDER_SITE_OTHER): Payer: Medicare Other | Admitting: Family Medicine

## 2021-11-27 VITALS — BP 123/75 | HR 49 | Temp 98.8°F | Resp 16

## 2021-11-27 DIAGNOSIS — E1169 Type 2 diabetes mellitus with other specified complication: Secondary | ICD-10-CM

## 2021-11-27 DIAGNOSIS — N644 Mastodynia: Secondary | ICD-10-CM

## 2021-11-27 DIAGNOSIS — E785 Hyperlipidemia, unspecified: Secondary | ICD-10-CM

## 2021-11-27 DIAGNOSIS — I10 Essential (primary) hypertension: Secondary | ICD-10-CM

## 2021-11-27 DIAGNOSIS — J441 Chronic obstructive pulmonary disease with (acute) exacerbation: Secondary | ICD-10-CM | POA: Diagnosis not present

## 2021-11-27 DIAGNOSIS — G8929 Other chronic pain: Secondary | ICD-10-CM

## 2021-11-27 DIAGNOSIS — F3181 Bipolar II disorder: Secondary | ICD-10-CM

## 2021-11-27 DIAGNOSIS — H9193 Unspecified hearing loss, bilateral: Secondary | ICD-10-CM

## 2021-11-27 DIAGNOSIS — F419 Anxiety disorder, unspecified: Secondary | ICD-10-CM | POA: Diagnosis not present

## 2021-11-27 DIAGNOSIS — F0670 Mild neurocognitive disorder due to known physiological condition without behavioral disturbance: Secondary | ICD-10-CM | POA: Diagnosis not present

## 2021-11-27 DIAGNOSIS — M5440 Lumbago with sciatica, unspecified side: Secondary | ICD-10-CM

## 2021-11-27 DIAGNOSIS — M545 Low back pain, unspecified: Secondary | ICD-10-CM

## 2021-11-27 DIAGNOSIS — M797 Fibromyalgia: Secondary | ICD-10-CM

## 2021-11-27 DIAGNOSIS — E1151 Type 2 diabetes mellitus with diabetic peripheral angiopathy without gangrene: Secondary | ICD-10-CM

## 2021-11-27 DIAGNOSIS — R413 Other amnesia: Secondary | ICD-10-CM

## 2021-11-27 DIAGNOSIS — M5442 Lumbago with sciatica, left side: Secondary | ICD-10-CM

## 2021-11-27 DIAGNOSIS — J449 Chronic obstructive pulmonary disease, unspecified: Secondary | ICD-10-CM

## 2021-11-27 DIAGNOSIS — E1165 Type 2 diabetes mellitus with hyperglycemia: Secondary | ICD-10-CM

## 2021-11-27 DIAGNOSIS — G609 Hereditary and idiopathic neuropathy, unspecified: Secondary | ICD-10-CM

## 2021-11-27 DIAGNOSIS — I700 Atherosclerosis of aorta: Secondary | ICD-10-CM

## 2021-11-27 MED ORDER — ALBUTEROL SULFATE HFA 108 (90 BASE) MCG/ACT IN AERS: 2.0000 | INHALATION_SPRAY | RESPIRATORY_TRACT | 3 refills | Status: DC | PRN

## 2021-11-27 MED ORDER — ALPRAZOLAM 0.5 MG PO TABS: ORAL_TABLET | ORAL | 0 refills | Status: DC

## 2021-11-27 NOTE — Telephone Encounter (Signed)
See phone note. I called and spoke with daughter. Patient is seeing Dr. Etter Sjogren today at 2:40. Daughter states patient has nausea, increased tremors, and "full blown panic attacks" that started Wednesday. She said that patient's husband laying down with her calms her. She said when she lays on her back she feels like she can't breathe and sitting up makes it better. Patient went to urgent care Saturday. Patient has been taking her meds because her daughter does the pill box and patient's husband makes sure she takes medication. Daughter said he is competent to handle the meds. Patient refuses to go get lithium level done because she doesn't feel like going and claims sickness when they attempt to take her. Daughter feels that this may be related to lithium. Daughter spoke with Barnett Applebaum this weekend, but I don't see any documentation. Please advise.

## 2021-11-27 NOTE — Patient Instructions (Signed)
Breast Tenderness Breast tenderness is a common problem for women of all ages, but may also occur in men. Breast tenderness may range from mild discomfort to severe pain. In women, the pain usually comes and goes with the menstrual cycle, but it can also be constant. Breast tenderness has many possible causes, including hormone changes, infections, and taking certain medicines. You may have tests, such as a mammogram or an ultrasound, to check for any unusual findings. Having breast tenderness usually does not mean that you have breast cancer. Follow these instructions at home: Managing pain and discomfort  If directed, put ice to the painful area. To do this: Put ice in a plastic bag. Place a towel between your skin and the bag. Leave the ice on for 20 minutes, 2-3 times a day. Wear a supportive bra, especially during exercise. You may also want to wear a supportive bra while sleeping if your breasts are very tender. Medicines Take over-the-counter and prescription medicines only as told by your health care provider. If the cause of your pain is infection, you may be prescribed an antibiotic medicine. If you were prescribed an antibiotic, take it as told by your health care provider. Do not stop taking the antibiotic even if you start to feel better. Eating and drinking Your health care provider may recommend that you lessen the amount of fat in your diet. You can do this by: Limiting fried foods. Cooking foods using methods such as baking, boiling, grilling, and broiling. Decrease the amount of caffeine in your diet. Instead, drink more water and choose caffeine-free drinks. General instructions  Keep a log of the days and times when your breasts are most tender. Ask your health care provider how to do breast exams at home. This will help you notice if you have an unusual growth or lump. Keep all follow-up visits as told by your health care provider. This is important. Contact a health care  provider if: Any part of your breast is hard, red, and hot to the touch. This may be a sign of infection. You are a woman and: Not breastfeeding and you have fluid, especially blood or pus, coming out of your nipples. Have a new or painful lump in your breast that remains after your menstrual period ends. You have a fever. Your pain does not improve or it gets worse. Your pain is interfering with your daily activities. Summary Breast tenderness may range from mild discomfort to severe pain. Breast tenderness has many possible causes, including hormone changes, infections, and taking certain medicines. It can be treated with ice, wearing a supportive bra, and medicines. Make changes to your diet if told to by your health care provider. This information is not intended to replace advice given to you by your health care provider. Make sure you discuss any questions you have with your health care provider. Document Revised: 03/23/2019 Document Reviewed: 03/23/2019 Elsevier Patient Education  2022 Elsevier Inc.  

## 2021-11-27 NOTE — Progress Notes (Signed)
Established Patient Office Visit  Subjective:  Patient ID: Megan Duncan, female    DOB: [birth year]  Age: 76 y.o. MRN: [id number]  CC:  Chief Complaint  Patient presents with   Cyst    Complains of knots under both arms worse on the right     Follow-up    Here for follow up    HPI Megan Duncan presents for breast pain x 33month -- sometimes she feels knots  Pt c//o tremors , sob and panic attacks-- she is working with psych and neuro. She had nausea and loose stools over the weekend but that has resolved  They are requesting home health referral but are waiting for the ins co to call back to let them know which co to use--- the have long term care ins  Her husband is also present and is asking for a handicap placard Past Medical History:  Diagnosis Date   Acute pain of right shoulder 09/24/2018   Acute upper respiratory infection 09/19/2010   Allergic rhinitis 10/03/2016   Anterolisthesis of lumbar spine    Arthritis    hands   Bilateral hip pain 09/02/2014   Bipolar II disorder    Cellulitis and abscess of trunk 09/15/2009   Ceruminosis 03/04/2014   Cervicalgia 06/20/2007   Chronic low back pain 06/20/2007   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-sided sciatica 08/06/2012   [place]D [place] obstructive [place] disease) [place]    Deformity of finger 07/15/2014   Degeneration of lumbar intervertebral disc 08/22/2020   Degenerative lumbar spinal stenosis    Dermatitis 024/40/1027  Diastolic dysfunction    [facility]  Diverticulosis    DOE (dyspnea on exertion) 07/20/2016   Essential (primary) hypertension 04/23/2007   Estrogen deficiency 09/01/2017   External hemorrhoid 08/01/2015   Fibromyalgia    Foraminal stenosis of lumbar region 08/22/2020   Generalized anxiety disorder 09/01/2018   Hyperlipidemia    Inflammatory disease of uterus 04/23/2007   Left flank pain 07/13/2021   Left upper quadrant pain 09/01/2017   Lower abdominal  pain 08/06/2012   Lumbar back pain with radiculopathy affecting left lower extremity 01/24/2010   Lumbar radiculopathy 01/26/2021   Lump in upper inner quadrant of left breast 07/13/2021   Major depressive disorder    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   Otitis media 09/05/2010   Palpitations 07/20/2016   Peripheral neuropathy 02/22/2010   Retrolisthesis of vertebrae 08/22/2020   Shingles outbreak 04/14/2015   Sinusitis, acute maxillary 09/14/2013   Stress incontinence, female 08/01/2015   Thoracic aortic atherosclerosis 05/14/2017   Tinea corporis 09/01/2017   Tinnitus 03/04/2014   Torticollis 03/04/2014   Type II diabetes mellitus 06/05/2018    Past Surgical History:  Procedure Laterality Date   APPENDECTOMY     EYE SURGERY     KNEE ARTHROSCOPY Right 11/12/2006   TONSILLECTOMY     TOTAL ABDOMINAL HYSTERECTOMY      Family History  Problem Relation Age of Onset   Hypertension Mother    Hiatal hernia Mother    Diabetes Father    Hypertension Father    Heart attack Father    Alcoholism Father    Bipolar disorder Father    Mental illness Sister    Suicidality Sister    Colon cancer Maternal Aunt 80   Breast cancer Maternal Aunt    Bipolar disorder Paternal Aunt     Social History   Socioeconomic History   Marital  status: Married    Spouse name: Not on file   Number of children: 3   Years of education: 14   Highest education level: Some college, no degree  Occupational History   Occupation: retired  Tobacco Use   Smoking status: Former    Packs/day: 1.50    Years: 40.00    Pack years: 60.00    Types: Cigarettes    Quit date: 07/02/2004    Years since quitting: 17.4   Smokeless tobacco: Never  Substance and Sexual Activity   Alcohol use: Yes    Alcohol/week: 0.0 - 1.0 standard drinks    Comment: occasional wine   Drug use: No   Sexual activity: Never  Other Topics Concern   Not on file  Social History Narrative   Lives  with husband and grandson she is raising.   11/07/21 Lives with husband   Social Determinants of Health   Financial Resource Strain: Medium Risk   Difficulty of Paying Living Expenses: Somewhat hard  Food Insecurity: No Food Insecurity   Worried About Charity fundraiser in the Last Year: Never true   Ran Out of Food in the Last Year: Never true  Transportation Needs: No Transportation Needs   Lack of Transportation (Medical): No   Lack of Transportation (Non-Medical): No  Physical Activity: Inactive   Days of Exercise per Week: 0 days   Minutes of Exercise per Session: 0 min  Stress: No Stress Concern Present   Feeling of Stress : Not at all  Social Connections: Moderately Integrated   Frequency of Communication with Friends and Family: More than three times a week   Frequency of Social Gatherings with Friends and Family: More than three times a week   Attends Religious Services: Never   Marine scientist or Organizations: Yes   Attends Music therapist: More than 4 times per year   Marital Status: Married  Human resources officer Violence: Not At Risk   Fear of Current or Ex-Partner: No   Emotionally Abused: No   Physically Abused: No   Sexually Abused: No    Outpatient Medications Prior to Visit  Medication Sig Dispense Refill   amLODipine (NORVASC) 5 MG tablet TAKE 1 TABLET (5 MG TOTAL) BY MOUTH DAILY. 90 tablet 1   blood glucose meter kit and supplies Dispense based on patient and insurance preference. Use once a day.  DX Code: E11.9 1 each 0   cephALEXin (KEFLEX) 500 MG capsule Take 1 capsule (500 mg total) by mouth 2 (two) times daily. 20 capsule 0   clorazepate (TRANXENE) 3.75 MG tablet Take 1 tablet (3.75 mg total) by mouth 2 (two) times daily as needed for anxiety or sleep. 60 tablet 1   DULoxetine (CYMBALTA) 30 MG capsule Take 1 capsule (30 mg total) by mouth daily. For 2 weeks, then start 60 mg daily. 14 capsule 0   DULoxetine (CYMBALTA) 60 MG capsule  TAKE 1 CAPSULE BY MOUTH EVERY DAY IN THE MORNING, after taking 30 mg for 2 weeks. 90 capsule 0   glucose blood test strip Use as instructed once a day.  Dx Code: E11.9 30 each 2   hydrocortisone-pramoxine (ANALPRAM HC) 2.5-1 % rectal cream Place 1 application rectally 3 (three) times daily. 30 g 3   Lancets MISC Use as directed once a day.  Dx code: E11.9 30 each 2   levothyroxine (SYNTHROID) 50 MCG tablet TAKE 1 TABLET BY MOUTH DAILY BEFORE BREAKFAST 90 tablet 1   lithium  carbonate 150 MG capsule TAKE 1 CAPSULE (150 MG TOTAL) BY MOUTH DAILY, TAKE WITH LITHIUM 600 MG TO EQUAL 750 MG TOTAL 90 capsule 0   lithium carbonate 300 MG capsule TAKE 2 CAPSULES (600 MG) BY MOUTH AT BEDTIME, WITH 150 MG CAPSULE 180 capsule 0   metFORMIN (GLUCOPHAGE) 1000 MG tablet Take 1 tablet (1,000 mg total) by mouth 2 (two) times daily with a meal. 60 tablet 2   metFORMIN (GLUCOPHAGE) 500 MG tablet TAKE 1 TABLET BY MOUTH 2 TIMES DAILY WITH A MEAL. 180 tablet 1   ondansetron (ZOFRAN) 4 MG tablet Take 1 tablet (4 mg total) by mouth every 8 (eight) hours as needed for nausea or vomiting. 15 tablet 0   propranolol (INDERAL) 20 MG tablet Take 1 tablet (20 mg total) by mouth 3 (three) times daily. 90 tablet 0   traZODone (DESYREL) 50 MG tablet TAKE 1/2 TO 2 TABLETS BY MOUTH EVERY DAY AT BEDTIME AS NEEDED FOR SLEEP 180 tablet 0   albuterol (VENTOLIN HFA) 108 (90 Base) MCG/ACT inhaler Inhale 2 puffs into the lungs every 4 (four) hours as needed. For shortness of breath and wheezing 18 g 3   ALPRAZolam (XANAX) 0.5 MG tablet Take 30 min prior to procedure 10 tablet 0   No facility-administered medications prior to visit.    Allergies  Allergen Reactions   Atorvastatin Other (See Comments)    Significant rise in liver tests   Codeine Nausea And Vomiting   Diclofenac Other (See Comments)    Elevated LFT's   Doxycycline     Extreme nausea   Sulfonamide Derivatives Swelling    ROS Review of Systems  Constitutional:   Positive for fatigue. Negative for activity change, appetite change and unexpected weight change.  Respiratory:  Negative for cough and shortness of breath.   Cardiovascular:  Negative for chest pain and palpitations.  Neurological:  Positive for weakness.  Psychiatric/Behavioral:  Positive for decreased concentration and dysphoric mood. Negative for behavioral problems. The patient is not nervous/anxious.      Objective:    Physical Exam Vitals and nursing note reviewed.  Constitutional:      Appearance: She is well-developed.  HENT:     Head: Normocephalic and atraumatic.  Eyes:     Conjunctiva/sclera: Conjunctivae normal.  Neck:     Thyroid: No thyromegaly.     Vascular: No carotid bruit or JVD.  Cardiovascular:     Rate and Rhythm: Normal rate and regular rhythm.     Heart sounds: Normal heart sounds. No murmur heard. Pulmonary:     Effort: Pulmonary effort is normal. No respiratory distress.     Breath sounds: Normal breath sounds. No wheezing or rales.  Chest:     Chest wall: No tenderness.  Breasts:    Right: Tenderness present. No swelling.     Left: Tenderness present. No swelling.     Comments: Tenderness lower outer quad  No errythema No masses palpated  Musculoskeletal:     Cervical back: Normal range of motion and neck supple.  Neurological:     Mental Status: She is alert. Mental status is at baseline.     Motor: Weakness present.     Gait: Gait abnormal.     Comments: Pt with chronic back pain affecting her legs     BP 123/75 (BP Location: Left Arm, Patient Position: Sitting, Cuff Size: Large)    Pulse (!) 49    Temp 98.8 F (37.1 C) (Oral)    Resp  16    SpO2 96%  Wt Readings from Last 3 Encounters:  11/25/21 180 lb (81.6 kg)  10/23/21 190 lb 12.8 oz (86.5 kg)  08/15/21 199 lb (90.3 kg)     Health Maintenance Due  Topic Date Due   TETANUS/TDAP  Never done   Zoster Vaccines- Shingrix (1 of 2) Never done   COLONOSCOPY (Pts 45-16yr Insurance  coverage will need to be confirmed)  07/17/2015   MAMMOGRAM  07/02/2019   COVID-19 Vaccine (4 - Booster for [place]) 11/16/2020   FOOT EXAM  09/22/2021   URINE MICROALBUMIN  09/22/2021    There are no preventive care reminders to display for this patient.  Lab Results  Component Value Date   TSH 1.12 10/23/2021   Lab Results  Component Value Date   WBC 9.7 11/25/2021   HGB 15.2 (H) 11/25/2021   HCT 45.9 11/25/2021   MCV 95.0 11/25/2021   PLT 376 11/25/2021   Lab Results  Component Value Date   NA 138 11/25/2021   K 4.1 11/25/2021   CO2 21 (L) 11/25/2021   GLUCOSE 150 (H) 11/25/2021   BUN 7 (L) 11/25/2021   CREATININE 0.82 11/25/2021   BILITOT 0.8 11/25/2021   ALKPHOS 71 11/25/2021   AST 34 11/25/2021   ALT 21 11/25/2021   PROT 7.6 11/25/2021   ALBUMIN 4.3 11/25/2021   CALCIUM 9.7 11/25/2021   ANIONGAP 9 11/25/2021   GFR 55.12 (L) 10/23/2021   Lab Results  Component Value Date   CHOL 186 10/19/2021   Lab Results  Component Value Date   HDL 70.90 10/19/2021   Lab Results  Component Value Date   LDLCALC 87 10/19/2021   Lab Results  Component Value Date   TRIG 140.0 10/19/2021   Lab Results  Component Value Date   CHOLHDL 3 10/19/2021   Lab Results  Component Value Date   HGBA1C 6.4 10/27/2021      Assessment & Plan:   Problem List Items Addressed This Visit       Unprioritized   Type II diabetes mellitus   Anxiety    Worsening Pt sees psych and has an app next week      Relevant Medications   ALPRAZolam (XANAX) 0.5 MG tablet   Bipolar 2 disorder    Per psych      Chronic low back pain    Refer to PT for water therapy Handicap placard app filled out for pt       Relevant Orders   Ambulatory referral to Physical Therapy   Essential (primary) hypertension    Well controlled, no changes to meds. Encouraged heart healthy diet such as the DASH diet and exercise as tolerated.       Hyperlipidemia associated with type 2 diabetes  mellitus ([place]    Encourage heart healthy diet such as MIND or DASH diet, increase exercise, avoid trans fats, simple carbohydrates and processed foods, consider a krill or fish or flaxseed oil cap daily.       Mild neurocognitive disorder due to multiple etiologies    Per psych and neuro      Other Visit Diagnoses     Breast pain    -  Primary   Relevant Orders   MM Digital Diagnostic Bilat   [place]AST COMPLETE UNI LEFT INC AXILLA   [place]AST COMPLETE UN[place] RIGHT INC AXILLA   Bronchitis, chronic obstructive, with exacerbation (HCC)   (Chronic)     Relevant Medications   albuterol (  VENTOLIN HFA) 108 (90 Base) MCG/ACT inhaler       Meds ordered this encounter  Medications   albuterol (VENTOLIN HFA) 108 (90 Base) MCG/ACT inhaler    Sig: Inhale 2 puffs into the lungs every 4 (four) hours as needed. For shortness of breath and wheezing    Dispense:  18 g    Refill:  3   ALPRAZolam (XANAX) 0.5 MG tablet    Sig: Take 30 min prior to procedure    Dispense:  10 tablet    Refill:  0    Follow-up: Return if symptoms worsen or fail to improve.    Ann Held, DO

## 2021-11-27 NOTE — Telephone Encounter (Signed)
° °  Telephone encounter was:  Successful.  11/27/2021 Name: Megan Duncan MRN: [id number] DOB: [birth year]  Megan Duncan is a 76 y.o. year old female who is a primary care patient of Ann Held, DO . The community resource team was consulted for assistance with  in-home care.  Care guide performed the following interventions: Spoke with patient's daughter Megan Duncan about [id number] referral to [place] and Development worker, community of [place] [facility].  Follow Up Plan:  Care guide will follow up with patient by phone over the next 7-10 days. Terin Cragle, AAS Paralegal, [facility]  [address], [place] [address] ??[email or web address]   ?? [phone]   [email or web address]

## 2021-11-27 NOTE — Telephone Encounter (Signed)
Pt has appointment this afternoon.

## 2021-11-27 NOTE — Telephone Encounter (Signed)
Megan Duncan, daughter of Megan Duncan called. She is listed on patient's DPR. Megan Duncan states that her mom had problems with her Lithium and toxicity this morning. Megan Duncan called an ambulance and Megan Duncan was taken to the ER. They did lab work. She would like a phone call back at [phone].

## 2021-11-27 NOTE — Telephone Encounter (Signed)
Yes, Barnett Applebaum told me she talked to her. Patient went to ER on 11/25/2021 and lithium level was done there.  It was 0.7 which is in a good range, not toxic level.  I am glad she is seeing Dr. Carollee Herter this afternoon.  For now, no change in psych meds, await eval and treatment per her PCP.

## 2021-11-27 NOTE — Telephone Encounter (Signed)
FYI. Pt went to the ED on 11/25/21

## 2021-11-27 NOTE — Telephone Encounter (Signed)
Called daughter and let her know that lithium level is in good range and that Megan Duncan will defer to the PCP at this time. Told her that since PCP was in the [facility] system we could access her note.

## 2021-11-28 ENCOUNTER — Encounter: Payer: Self-pay | Admitting: Family Medicine

## 2021-11-28 ENCOUNTER — Telehealth: Payer: Self-pay | Admitting: *Deleted

## 2021-11-28 ENCOUNTER — Telehealth: Payer: Self-pay | Admitting: Family Medicine

## 2021-11-28 ENCOUNTER — Telehealth: Payer: Medicare Other

## 2021-11-28 DIAGNOSIS — F419 Anxiety disorder, unspecified: Secondary | ICD-10-CM | POA: Insufficient documentation

## 2021-11-28 NOTE — Assessment & Plan Note (Signed)
Refer to PT for water therapy Handicap placard app filled out for pt

## 2021-11-28 NOTE — Assessment & Plan Note (Signed)
Per psych 

## 2021-11-28 NOTE — Chronic Care Management (AMB) (Signed)
°  Care Management   Note  11/28/2021 Name: LIVIA TARR MRN: [id number] DOB: 01/19/1946  Lanelle Bal Rettinger is a 76 y.o. year old female who is a primary care patient of Ann Held, DO and is actively engaged with the care management team. I reached out to [name] by phone today to assist with re-scheduling a follow up visit with the RN Case Manager  Follow up plan: Pt daughter [name] will call back to reschedule   Julian Hy, [place], [place] [facility]  Direct Dial: [phone]

## 2021-11-28 NOTE — Assessment & Plan Note (Signed)
Well controlled, no changes to meds. Encouraged heart healthy diet such as the DASH diet and exercise as tolerated.  °

## 2021-11-28 NOTE — Assessment & Plan Note (Signed)
Worsening Pt sees psych and has an app next week

## 2021-11-28 NOTE — Telephone Encounter (Signed)
Pt seen yesterday. Please advise

## 2021-11-28 NOTE — Telephone Encounter (Signed)
Pt spouse [name] contact ov regarding pt. He would like for someone to contact him @ [phone]. He stated that his wife has not slept last night, she's having real bad anxiety attacks and vomiting. Please advise.

## 2021-11-28 NOTE — Assessment & Plan Note (Signed)
Encourage heart healthy diet such as MIND or DASH diet, increase exercise, avoid trans fats, simple carbohydrates and processed foods, consider a krill or fish or flaxseed oil cap daily.  °

## 2021-11-28 NOTE — Assessment & Plan Note (Signed)
Per psych and neuro

## 2021-11-29 ENCOUNTER — Other Ambulatory Visit: Payer: Self-pay | Admitting: Family Medicine

## 2021-11-29 DIAGNOSIS — I10 Essential (primary) hypertension: Secondary | ICD-10-CM

## 2021-11-29 MED ORDER — METFORMIN HCL 1000 MG PO TABS: 1000.0000 mg | ORAL_TABLET | Freq: Two times a day (BID) | ORAL | 1 refills | Status: DC

## 2021-11-29 NOTE — Chronic Care Management (AMB) (Signed)
Chronic Care Management    Clinical Social Work Note  11/29/2021 Name: Megan Duncan MRN: [id number] DOB: [birth year]  Megan Duncan is a 76 y.o. year old female who is a primary care patient of Ann Held, DO. The CCM team was consulted to assist the patient with chronic disease management and/or care coordination needs related to: [employer], Level of Care Concerns, and Caregiver Stress.   Engaged with patient, patient's husband, and patient's daughter by telephone for initial visit in response to provider referral for social work chronic care management and care coordination services.   Consent to Services:  The patient was given information about Chronic Care Management services, agreed to services, and gave verbal consent prior to initiation of services.  Please see initial visit note for detailed documentation.   Patient agreed to services and consent obtained.   Assessment: Review of patient past medical history, allergies, medications, and health status, including review of relevant consultants reports was performed today as part of a comprehensive evaluation and provision of chronic care management and care coordination services.     SDOH (Social Determinants of Health) assessments and interventions performed:  SDOH Interventions    Flowsheet Row Most Recent Value  SDOH Interventions   Food Insecurity Interventions Intervention Not Indicated, Other (Comment)  [Verified by Aldona Lento (Husband), & Lauralee Evener (Daughter)]  Financial Strain Interventions Intervention Not Indicated, Other (Comment)  [Patient and Husband Have Long-Term Care Insurance Policies to Coverage Cost of In-Home Care Services.]  Housing Interventions Intervention Not Indicated, Other (Comment)  [Verified by Aldona Lento (Husband), & Lauralee Evener (Daughter)]  Intimate Partner Violence Interventions Intervention Not Indicated, Other (Comment)  [Verified by Aldona Lento (Husband),  & Lauralee Evener (Daughter)]  Physical Activity Interventions Intervention Not Indicated, Patient Refused, Other (Comments)  [Verified by Aldona Lento (Husband), & Lauralee Evener (Daughter) - Patient Indicated Wanting to [place]  Stress Interventions Intervention Not Indicated, Other (Comment)  [Verified by Aldona Lento (Husband), & Lauralee Evener (Daughter)]  Social Connections Interventions Intervention Not Indicated, Other (Comment)  [Verified by Aldona Lento (Husband), & Lauralee Evener (Daughter)]  Transportation Interventions Intervention Not Indicated, Other (Comment)  [Verified by Aldona Lento (Husband), & Lauralee Evener (Daughter)]        Advanced Directives Status: Not ready or willing to discuss.  CCM Care Plan  Allergies  Allergen Reactions   Atorvastatin Other (See Comments)    Significant rise in liver tests   Codeine Nausea And Vomiting   Diclofenac Other (See Comments)    Elevated LFT's   Doxycycline     Extreme nausea   Sulfonamide Derivatives Swelling    Outpatient Encounter Medications as of 11/27/2021  Medication Sig Note   amLODipine (NORVASC) 5 MG tablet TAKE 1 TABLET (5 MG TOTAL) BY MOUTH DAILY.    blood glucose meter kit and supplies Dispense based on patient and insurance preference. Use once a day.  DX Code: E11.9    cephALEXin (KEFLEX) 500 MG capsule Take 1 capsule (500 mg total) by mouth 2 (two) times daily.    clorazepate (TRANXENE) 3.75 MG tablet Take 1 tablet (3.75 mg total) by mouth 2 (two) times daily as needed for anxiety or sleep.    DULoxetine (CYMBALTA) 30 MG capsule Take 1 capsule (30 mg total) by mouth daily. For 2 weeks, then start 60 mg daily.    DULoxetine (CYMBALTA) 60 MG capsule TAKE 1 CAPSULE BY MOUTH EVERY DAY IN THE MORNING, after taking 30 mg for 2  weeks.    glucose blood test strip Use as instructed once a day.  Dx Code: E11.9    hydrocortisone-pramoxine (ANALPRAM HC) 2.5-1 % rectal cream Place 1 application rectally  3 (three) times daily.    Lancets MISC Use as directed once a day.  Dx code: E11.9    levothyroxine (SYNTHROID) 50 MCG tablet TAKE 1 TABLET BY MOUTH DAILY BEFORE BREAKFAST    lithium carbonate 150 MG capsule TAKE 1 CAPSULE (150 MG TOTAL) BY MOUTH DAILY, TAKE WITH LITHIUM 600 MG TO EQUAL 750 MG TOTAL    lithium carbonate 300 MG capsule TAKE 2 CAPSULES (600 MG) BY MOUTH AT BEDTIME, WITH 150 MG CAPSULE    metFORMIN (GLUCOPHAGE) 1000 MG tablet Take 1 tablet (1,000 mg total) by mouth 2 (two) times daily with a meal.    metFORMIN (GLUCOPHAGE) 500 MG tablet TAKE 1 TABLET BY MOUTH 2 TIMES DAILY WITH A MEAL.    ondansetron (ZOFRAN) 4 MG tablet Take 1 tablet (4 mg total) by mouth every 8 (eight) hours as needed for nausea or vomiting.    propranolol (INDERAL) 20 MG tablet Take 1 tablet (20 mg total) by mouth 3 (three) times daily. 11/07/2021: Reports takes 40 mg in the morning and 20 mg in the evening   traZODone (DESYREL) 50 MG tablet TAKE 1/2 TO 2 TABLETS BY MOUTH EVERY DAY AT BEDTIME AS NEEDED FOR SLEEP    [DISCONTINUED] albuterol (VENTOLIN HFA) 108 (90 Base) MCG/ACT inhaler Inhale 2 puffs into the lungs every 4 (four) hours as needed. For shortness of breath and wheezing    [DISCONTINUED] ALPRAZolam (XANAX) 0.5 MG tablet Take 30 min prior to procedure    No facility-administered encounter medications on file as of 11/27/2021.    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   Degenerative lumbar spinal stenosis    Anterolisthesis of lumbar spine    Lump in upper inner quadrant of left breast 07/13/2021   Left flank pain 07/13/2021   Lumbar radiculopathy 01/26/2021   Body mass index (BMI) 36.0-36.9, adult 01/11/2021   Degeneration of lumbar intervertebral disc 08/22/2020   Retrolisthesis of vertebrae 08/22/2020   Foraminal stenosis of lumbar  region 08/22/2020   Generalized anxiety disorder 09/01/2018   Type II diabetes mellitus 06/05/2018   Tinea corporis 09/01/2017   Left upper quadrant pain 09/01/2017   Estrogen deficiency 09/01/2017   Thoracic aortic atherosclerosis 05/14/2017   COPD (chronic obstructive pulmonary disease) 10/30/2016   Allergic rhinitis 10/03/2016   Palpitations 07/20/2016   DOE (dyspnea on exertion) 07/20/2016   External hemorrhoid 08/01/2015   Stress incontinence, female 08/01/2015   Dermatitis 06/02/2015   Shingles outbreak 04/14/2015   Hyperlipidemia associated with type 2 diabetes mellitus ([place])    Fibromyalgia 10/14/2014   Bilateral hip pain 09/02/2014   Deformity of finger 07/15/2014   Ceruminosis 03/04/2014   Tinnitus 03/04/2014   Torticollis 03/04/2014   Bipolar 2 disorder 12/03/2013   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-sided sciatica 08/06/2012   Lower abdominal pain 08/06/2012   Otitis media 09/05/2010   Peripheral neuropathy 02/22/2010   Lumbar back pain with radiculopathy affecting left lower extremity 01/24/2010   Cervicalgia 06/20/2007   Chronic low back pain 06/20/2007   Essential (primary) hypertension 04/23/2007   Inflammatory disease of uterus 04/23/2007    Conditions to be addressed/monitored: HTN, DMII, and  Dementia.  Level of Care Concerns, ADL/IADL Limitations, Limited Access to Caregiver, Cognitive Deficits, Memory Deficits, and Lacks Knowledge of [employer].  Care Plan : LCSW Plan of Care  Updates made by Francis Gaines, LCSW since 11/29/2021 12:00 AM     Problem: Improve Quality of Life through [facility].   Priority: High     Goal: Improve Quality of Life through [facility].   Start Date: 11/27/2021  Expected End Date: 01/25/2022  This Visit's Progress: On track  Priority: High  Note:   Current Barriers:   Patient with Thoracic Aortic Atherosclerosis, Essential Hypertension, Chronic Obstructive Pulmonary Disease, Type II  Diabetes Mellitus, Mild Neurocognitive Disorder, Bilateral Hearing Loss, Memory Loss, Fibromyalgia, Bipolar II Disorder, Generalized Anxiety Disorder, and Caregiver Stress needs Support, Education, Resources, Referrals, Advocacy, and Care Coordination to resolve unmet personal care needs in the home. Patient is unable to self-administer medications as prescribed, or consistently perform ADL's/IADL's independently. Lacks knowledge of available community agencies and resources. Clinical Goals:  Patient, husband, and daughter will work with LCSW, in an effort to coordinate in-home care services, through [employer].   Patient will demonstrate improved health management independence, as evidenced by having in-home care services in place. Clinical Interventions: Patient, husband, and daughter interviewed and appropriate assessments performed. Collaboration with Primary Care Physician, Dr. Roma Schanz regarding development and update of comprehensive plan of care, as evidenced by provider attestation and co-signature. Inter-disciplinary care team collaboration (see longitudinal plan of care). Interventions performed:  Problem Solving/Task Centered, Psychoeducation/Health Education, Quality of Sleep Assessed and Sleep Hygiene Techniques Promoted, Caregiver Stress Acknowledged and Consideration of In-Home Care Services Encouraged. Discussed plans with patient, husband, and daughter for ongoing care management follow-up and provided direct contact information for care management team. Assisted patient, husband, and daughter with obtaining information about health plan benefits through Traditional Medicare and [employer]. Provided education to patient, husband, and daughter regarding level of care options. Assessed needs, level of care concerns, basic eligibility and provided education on process for identifying coverage hours and preferred agency providers  through long-term care insurance policy. Identified resources and durable medical equipment needed in the home to improve safety and promote independence. Patient Goals/Self-Care Activities:  Work with LCSW on a bi-weekly basis, in an effort to coordinate hours of in-home care services through long-term care insurance policy.   Husband agreed to contact long-term care insurance policy, and inquire about the following list of questions:  ~ How many hours of in-home care services are covered per day, per week, per month, per fiscal year?  ~ Is Mrs. Srey able to utilize Mr. [place]'s hours of in-home care services, if hours are exhausted under her policy?   ~ Request list of preferred/in-network in-home care providers covered under policy.  ~ Out-of-pocket expenses, if any? ~ What types of in-home care services are covered under policy (I.e meal preparation, light housekeeping duties, bathing, dressing, transportation, grocery shopping, medication administration and monitoring, laundry, etc.)? Continue to consider [place] referral to [place] and [employer] [facility] [place] Case Assistance and Options Counseling, placed by [employer], [employer], on 11/27/2021. Please contact LCSW directly (# [phone]) if you have questions, need assistance, or if additional social work needs are identified between now and our next scheduled telephone outreach call. Remember: Always use handrails on the stairs. Always wear your glasses, especially at night or in areas not well lit.  Follow-Up Plan:  12/11/2021 at 3:00 pm      Nat Christen Newell Licensed Clinical Social Worker [facility] [phone]

## 2021-11-29 NOTE — Patient Instructions (Signed)
Visit Information   Thank you for taking time to visit with me today. Please don't hesitate to contact me if I can be of assistance to you before our next scheduled telephone appointment.  Following are the goals we discussed today:  Patient Goals/Self-Care Activities:  Work with LCSW on a bi-weekly basis, in an effort to coordinate hours of in-home care services through long-term care insurance policy.   Husband agreed to contact long-term care insurance policy, and inquire about the following list of questions:  ~ How many hours of in-home care services are covered per day, per week, per month, per fiscal year?  ~ Is Mrs. Tuch able to utilize Mr. Kuck's hours of in-home care services, if hours are exhausted under her policy?   ~ Request list of preferred/in-network in-home care providers covered under policy.  ~ Out-of-pocket expenses, if any? ~ What types of in-home care services are covered under policy (I.e meal preparation, light housekeeping duties, bathing, dressing, transportation, grocery shopping, medication administration and monitoring, laundry, etc.)? Continue to consider [place] referral to [place] and [employer] [facility] [place] Case Assistance and Options Counseling, placed by [employer], [employer], on 11/27/2021. Please contact LCSW directly (# [phone]) if you have questions, need assistance, or if additional social work needs are identified between now and our next scheduled telephone outreach call. Remember: Always use handrails on the stairs. Always wear your glasses, especially at night or in areas not well lit.  Follow-Up Plan:  12/11/2021 at 3:00 pm  Please call the care guide team at [phone] if you need to cancel or reschedule your appointment.   If you are experiencing a Mental Health or [place] or need someone to talk to, please call the Suicide and Crisis  Lifeline: 988 call the [place] National Suicide Prevention Lifeline: (208)706-2651 or TTY: 339 590 1907 TTY 6782940394) to talk to a trained counselor call 1-800-273-TALK (toll free, 24 hour hotline) go to [facility] [address] [place] [phone]) call the [facility]: [phone] call 911   Following is a copy of your full care plan:  Care Plan : LCSW Plan of Care  Updates made by Francis Gaines, LCSW since 11/29/2021 12:00 AM     Problem: Improve Quality of Life through [facility].   Priority: High     Goal: Improve Quality of Life through [facility].   Start Date: 11/27/2021  Expected End Date: 01/25/2022  This Visit's Progress: On track  Priority: High  Note:   Current Barriers:   Patient with Thoracic Aortic Atherosclerosis, Essential Hypertension, Chronic Obstructive Pulmonary Disease, Type II Diabetes Mellitus, Mild Neurocognitive Disorder, Bilateral Hearing Loss, Memory Loss, Fibromyalgia, Bipolar II Disorder, Generalized Anxiety Disorder, and Caregiver Stress needs Support, Education, Resources, Referrals, Advocacy, and Care Coordination to resolve unmet personal care needs in the home. Patient is unable to self-administer medications as prescribed, or consistently perform ADL's/IADL's independently. Lacks knowledge of available community agencies and resources. Clinical Goals:  Patient, husband, and daughter will work with LCSW, in an effort to coordinate in-home care services, through [employer].   Patient will demonstrate improved health management independence, as evidenced by having in-home care services in place. Clinical Interventions: Patient, husband, and daughter interviewed and appropriate assessments performed. Collaboration with Primary Care Physician, Dr. Roma Schanz regarding development and update of comprehensive plan of care, as evidenced by  provider attestation and co-signature. Inter-disciplinary care  team collaboration (see longitudinal plan of care). Interventions performed:  Problem Solving/Task Centered, Psychoeducation/Health Education, Quality of Sleep Assessed and Sleep Hygiene Techniques Promoted, Caregiver Stress Acknowledged and Consideration of In-Home Care Services Encouraged. Discussed plans with patient, husband, and daughter for ongoing care management follow-up and provided direct contact information for care management team. Assisted patient, husband, and daughter with obtaining information about health plan benefits through Traditional [employer] and [employer]. Provided education to patient, husband, and daughter regarding level of care options. Assessed needs, level of care concerns, basic eligibility and provided education on process for identifying coverage hours and preferred agency providers through long-term care insurance policy. Identified resources and durable medical equipment needed in the home to improve safety and promote independence. Patient Goals/Self-Care Activities:  Work with LCSW on a bi-weekly basis, in an effort to coordinate hours of in-home care services through long-term care insurance policy.   Husband agreed to contact long-term care insurance policy, and inquire about the following list of questions:  ~ How many hours of in-home care services are covered per day, per week, per month, per fiscal year?  ~ Is Mrs. Hahne able to utilize Mr. [place]'s hours of in-home care services, if hours are exhausted under her policy?   ~ Request list of preferred/in-network in-home care providers covered under policy.  ~ Out-of-pocket expenses, if any? ~ What types of in-home care services are covered under policy (I.e meal preparation, light housekeeping duties, bathing, dressing, transportation, grocery shopping, medication administration and monitoring, laundry, etc.)? Continue  to consider [place] referral to [place] and [employer] [facility] [place] Case Assistance and Options Counseling, placed by [employer], [employer], on 11/27/2021. Please contact LCSW directly (# [phone]) if you have questions, need assistance, or if additional social work needs are identified between now and our next scheduled telephone outreach call. Remember: Always use handrails on the stairs. Always wear your glasses, especially at night or in areas not well lit.  Follow-Up Plan:  12/11/2021 at 3:00 pm        Consent to CCM Services: Ms. Connon was given information about Chronic Care Management services including:  CCM service includes personalized support from designated clinical staff supervised by her physician, including individualized plan of care and coordination with other care providers 24/7 contact phone numbers for assistance for urgent and routine care needs. Service will only be billed when office clinical staff spend 20 minutes or more in a month to coordinate care. Only one practitioner may furnish and bill the service in a calendar month. The patient may stop CCM services at any time (effective at the end of the month) by phone call to the office staff. The patient will be responsible for cost sharing (co-pay) of up to 20% of the service fee (after annual deductible is met).  Patient agreed to services and verbal consent obtained.   Patient verbalizes understanding of instructions and care plan provided today and agrees to view in [place]. Active MyChart status confirmed with patient.    Telephone follow up appointment with care management team member scheduled for:  12/11/2021 at 3:00 pm.  Nat Christen LCSW Licensed Clinical Social Worker [place] [phone]

## 2021-11-30 ENCOUNTER — Other Ambulatory Visit: Payer: Self-pay | Admitting: Family Medicine

## 2021-11-30 MED ORDER — QUETIAPINE FUMARATE 50 MG PO TABS: 50.0000 mg | ORAL_TABLET | Freq: Every day | ORAL | 2 refills | Status: DC

## 2021-11-30 NOTE — Telephone Encounter (Signed)
Spoke with patient's husband. Pt states they have tried both medications and they're not seeing a big difference. He states patient has become extremely argumentative and irritated with increased confusion. He states patient isn't eating a lot and I advised this could be causing her stomach to hurt if she is taking her meds on a empty stomach. Pt does eat breakfast in the morning but not much else during the day. I did advise if patient could try Ensure before taking meds to help coat her stomach. Pt is taking Zofran has needed. Please advise

## 2021-12-01 ENCOUNTER — Emergency Department (HOSPITAL_BASED_OUTPATIENT_CLINIC_OR_DEPARTMENT_OTHER): Payer: Medicare Other

## 2021-12-01 ENCOUNTER — Other Ambulatory Visit: Payer: Self-pay

## 2021-12-01 ENCOUNTER — Emergency Department (HOSPITAL_BASED_OUTPATIENT_CLINIC_OR_DEPARTMENT_OTHER)
Admission: EM | Admit: 2021-12-01 | Discharge: 2021-12-01 | Disposition: A | Discharge status: Home / Self Care | Payer: Medicare Other | Attending: Emergency Medicine | Admitting: Emergency Medicine

## 2021-12-01 ENCOUNTER — Encounter (HOSPITAL_BASED_OUTPATIENT_CLINIC_OR_DEPARTMENT_OTHER): Payer: Self-pay | Admitting: Emergency Medicine

## 2021-12-01 DIAGNOSIS — I700 Atherosclerosis of aorta: Secondary | ICD-10-CM | POA: Diagnosis not present

## 2021-12-01 DIAGNOSIS — G930 Cerebral cysts: Secondary | ICD-10-CM | POA: Diagnosis not present

## 2021-12-01 DIAGNOSIS — K760 Fatty (change of) liver, not elsewhere classified: Secondary | ICD-10-CM | POA: Diagnosis not present

## 2021-12-01 DIAGNOSIS — M549 Dorsalgia, unspecified: Secondary | ICD-10-CM | POA: Diagnosis not present

## 2021-12-01 DIAGNOSIS — R109 Unspecified abdominal pain: Secondary | ICD-10-CM | POA: Diagnosis not present

## 2021-12-01 DIAGNOSIS — N390 Urinary tract infection, site not specified: Secondary | ICD-10-CM

## 2021-12-01 DIAGNOSIS — R410 Disorientation, unspecified: Secondary | ICD-10-CM | POA: Diagnosis not present

## 2021-12-01 DIAGNOSIS — R9431 Abnormal electrocardiogram [ECG] [EKG]: Secondary | ICD-10-CM | POA: Diagnosis not present

## 2021-12-01 DIAGNOSIS — R110 Nausea: Secondary | ICD-10-CM | POA: Insufficient documentation

## 2021-12-01 DIAGNOSIS — R4182 Altered mental status, unspecified: Secondary | ICD-10-CM | POA: Diagnosis not present

## 2021-12-01 DIAGNOSIS — E119 Type 2 diabetes mellitus without complications: Secondary | ICD-10-CM | POA: Insufficient documentation

## 2021-12-01 DIAGNOSIS — R002 Palpitations: Secondary | ICD-10-CM | POA: Diagnosis not present

## 2021-12-01 DIAGNOSIS — R0602 Shortness of breath: Secondary | ICD-10-CM | POA: Diagnosis not present

## 2021-12-01 DIAGNOSIS — F0390 Unspecified dementia without behavioral disturbance: Secondary | ICD-10-CM | POA: Diagnosis not present

## 2021-12-01 DIAGNOSIS — R443 Hallucinations, unspecified: Secondary | ICD-10-CM | POA: Diagnosis not present

## 2021-12-01 DIAGNOSIS — Z7984 Long term (current) use of oral hypoglycemic drugs: Secondary | ICD-10-CM | POA: Insufficient documentation

## 2021-12-01 DIAGNOSIS — R1012 Left upper quadrant pain: Secondary | ICD-10-CM | POA: Diagnosis not present

## 2021-12-01 DIAGNOSIS — Z79899 Other long term (current) drug therapy: Secondary | ICD-10-CM | POA: Diagnosis not present

## 2021-12-01 DIAGNOSIS — I10 Essential (primary) hypertension: Secondary | ICD-10-CM | POA: Diagnosis not present

## 2021-12-01 DIAGNOSIS — F419 Anxiety disorder, unspecified: Secondary | ICD-10-CM | POA: Diagnosis not present

## 2021-12-01 LAB — CBC WITH DIFFERENTIAL/PLATELET
Abs Immature Granulocytes: 0.02 10*3/uL (ref 0.00–0.07)
Basophils Absolute: 0.1 10*3/uL (ref 0.0–0.1)
Basophils Relative: 1 %
Eosinophils Absolute: 0.4 10*3/uL (ref 0.0–0.5)
Eosinophils Relative: 6 %
HCT: 42.7 % (ref 36.0–46.0)
Hemoglobin: 13.7 g/dL (ref 12.0–15.0)
Immature Granulocytes: 0 %
Lymphocytes Relative: 23 %
Lymphs Abs: 1.6 10*3/uL (ref 0.7–4.0)
MCH: 31.1 pg (ref 26.0–34.0)
MCHC: 32.1 g/dL (ref 30.0–36.0)
MCV: 97 fL (ref 80.0–100.0)
Monocytes Absolute: 0.7 10*3/uL (ref 0.1–1.0)
Monocytes Relative: 9 %
Neutro Abs: 4.4 10*3/uL (ref 1.7–7.7)
Neutrophils Relative %: 61 %
Platelets: 308 10*3/uL (ref 150–400)
RBC: 4.4 MIL/uL (ref 3.87–5.11)
RDW: 13.9 % (ref 11.5–15.5)
WBC: 7.2 10*3/uL (ref 4.0–10.5)
nRBC: 0 % (ref 0.0–0.2)

## 2021-12-01 LAB — URINALYSIS, ROUTINE W REFLEX MICROSCOPIC
Bilirubin Urine: NEGATIVE
Glucose, UA: NEGATIVE mg/dL
Hgb urine dipstick: NEGATIVE
Ketones, ur: NEGATIVE mg/dL
Nitrite: NEGATIVE
Protein, ur: NEGATIVE mg/dL
Specific Gravity, Urine: 1.015 (ref 1.005–1.030)
pH: 7 (ref 5.0–8.0)

## 2021-12-01 LAB — COMPREHENSIVE METABOLIC PANEL
ALT: 17 U/L (ref 0–44)
AST: 26 U/L (ref 15–41)
Albumin: 3.9 g/dL (ref 3.5–5.0)
Alkaline Phosphatase: 60 U/L (ref 38–126)
Anion gap: 8 (ref 5–15)
BUN: 10 mg/dL (ref 8–23)
CO2: 24 mmol/L (ref 22–32)
Calcium: 9.3 mg/dL (ref 8.9–10.3)
Chloride: 106 mmol/L (ref 98–111)
Creatinine, Ser: 1.03 mg/dL — ABNORMAL HIGH (ref 0.44–1.00)
GFR, Estimated: 57 mL/min — ABNORMAL LOW (ref >60)
Glucose, Bld: 126 mg/dL — ABNORMAL HIGH (ref 70–99)
Potassium: 4.1 mmol/L (ref 3.5–5.1)
Sodium: 138 mmol/L (ref 135–145)
Total Bilirubin: 0.9 mg/dL (ref 0.3–1.2)
Total Protein: 6.8 g/dL (ref 6.5–8.1)

## 2021-12-01 LAB — URINALYSIS, MICROSCOPIC (REFLEX)

## 2021-12-01 LAB — LIPASE, BLOOD: Lipase: 27 U/L (ref 11–51)

## 2021-12-01 LAB — TROPONIN I (HIGH SENSITIVITY): Troponin I (High Sensitivity): 4 ng/L (ref <18)

## 2021-12-01 MED ORDER — CEPHALEXIN 500 MG PO CAPS: 500.0000 mg | ORAL_CAPSULE | Freq: Two times a day (BID) | ORAL | 0 refills | Status: DC

## 2021-12-01 MED ORDER — IOHEXOL 300 MG/ML  SOLN
100.0000 mL | Freq: Once | INTRAMUSCULAR | Status: AC | PRN
  Administered 2021-12-01: 100 mL via INTRAVENOUS

## 2021-12-01 NOTE — ED Notes (Signed)
Patient transported to CT 

## 2021-12-01 NOTE — ED Triage Notes (Addendum)
Pt brought to ED with husband. Husband states pt has been having anxiety, shob, palpitations and confusion last 3 nights. Pt denies any pain. Pt states she is having difficulty remember information.

## 2021-12-01 NOTE — Progress Notes (Deleted)
Assessment/Plan:    Tremor  -This is likely due to the lithium.  Studies are varied, but incidate that up to 2/3 of patients on lithium will have lithium-induced tremor, even if the patients are not toxic on the medication.  This is just a common side effect of patients on lithium.  I did not recommend that she stop or taper the lithium, but rather talk to her prescribing physician about this.  I did tell her that in my experience, lithium-induced tremor does not respond well to attempts at treating it with other medications.  She is already on propranolol, 20 mg 3 times per day.    Subjective:   Megan Duncan was seen in consultation in the movement disorder clinic at the request of Ann Held, *.  Patient is a 76 year old female with history of chronic low back pain (virtually bedbound for 2 years per primary care records), bipolar disorder, diabetes mellitus who presents for the evaluation of tremor.  Tremor started approximately *** ago and involves the ***.  Tremor is most noticeable when ***.   There is *** family hx of tremor.    Affected by caffeine:  {yes no:314532} Affected by alcohol:  {yes no:314532} Affected by stress:  {yes no:314532} Affected by fatigue:  {yes no:314532} Spills soup if on spoon:  {yes no:314532} Spills glass of liquid if full:  {yes no:314532} Affects ADL's (tying shoes, brushing teeth, etc):  {yes no:314532}  Current/Previously tried tremor medications: ***Propranolol, 20 mg daily 3 times per day  Current medications that may exacerbate tremor:  ***Lithium, quetiapine (only on 50 mg)  As above, patient has history of bipolar disorder.  Records from her PA that she sees for bipolar are reviewed.  She was last seen on September 28, 2021.  This was actually a phone visit.  They did address tremor.  States "continues to have bilateral hand tremor, to the point she is unable to write while at all.  Propranolol was helpful.  Eating is hard sometimes  due to the tremor."  Patient had neurocognitive testing with Dr. Melvyn Novas on September 07, 2021.  This demonstrated evidence of MCI.  Records reviewed.  Patient in the emergency room January 14 with nausea and tremor.  Patient left AMA.  Patient back in the emergency room January 20 with visual and auditory hallucinations, anxiety with shortness of breath and nausea from 1 month.  Patient was treated for a possible urinary tract infection and released.  She did have CT brain that day that demonstrated probable arachnoid cyst and minimal atrophy.  Outside reports reviewed: {Outside review:15817}.  Allergies  Allergen Reactions   Atorvastatin Other (See Comments)    Significant rise in liver tests   Codeine Nausea And Vomiting   Diclofenac Other (See Comments)    Elevated LFT's   Doxycycline     Extreme nausea   Sulfonamide Derivatives Swelling    No outpatient medications have been marked as taking for the 12/04/21 encounter (Appointment) with Ozias Dicenzo, Eustace Quail, DO.      Objective:   VITALS:  There were no vitals filed for this visit. Gen:  Appears stated age and in NAD. HEENT:  Normocephalic, atraumatic. The mucous membranes are moist. The superficial temporal arteries are without ropiness or tenderness. Cardiovascular: Regular rate and rhythm. Lungs: Clear to auscultation bilaterally. Neck: There are no carotid bruits noted bilaterally.  NEUROLOGICAL:  Orientation:  The patient is alert and oriented x 3.   Cranial nerves: There is good facial  symmetry. Extraocular muscles are intact and visual fields are full to confrontational testing. Speech is fluent and clear. Soft palate rises symmetrically and there is no tongue deviation. Hearing is intact to conversational tone. Tone: Tone is good throughout. Sensation: Sensation is intact to light touch touch throughout (facial, trunk, extremities). Vibration is intact at the bilateral big toe. There is no extinction with double simultaneous  stimulation. There is no sensory dermatomal level identified. Coordination:  The patient has no dysdiadichokinesia or dysmetria. Motor: Strength is 5/5 in the bilateral upper and lower extremities.  Shoulder shrug is equal bilaterally.  There is no pronator drift.  There are no fasciculations noted. DTR's: Deep tendon reflexes are 2/4 at the bilateral biceps, triceps, brachioradialis, patella and achilles.  Plantar responses are downgoing bilaterally. Gait and Station: The patient is able to ambulate without difficulty. The patient is able to heel toe walk without any difficulty. The patient is able to ambulate in a tandem fashion. The patient is able to stand in the Romberg position.   MOVEMENT EXAM: Tremor:  There is *** tremor in the UE, noted most significantly with action.  The patient is *** able to draw Archimedes spirals without significant difficulty.  There is *** tremor at rest.  The patient is *** able to pour water from one glass to another without spilling it.  I have reviewed and interpreted the following labs independently   Chemistry      Component Value Date/Time   NA 138 11/25/2021 1454   K 4.1 11/25/2021 1454   CL 108 11/25/2021 1454   CO2 21 (L) 11/25/2021 1454   BUN 7 (L) 11/25/2021 1454   CREATININE 0.82 11/25/2021 1454   CREATININE 0.76 08/30/2017 1541      Component Value Date/Time   CALCIUM 9.7 11/25/2021 1454   ALKPHOS 71 11/25/2021 1454   AST 34 11/25/2021 1454   ALT 21 11/25/2021 1454   BILITOT 0.8 11/25/2021 1454      Lab Results  Component Value Date   WBC 7.2 12/01/2021   HGB 13.7 12/01/2021   HCT 42.7 12/01/2021   MCV 97.0 12/01/2021   PLT 308 12/01/2021   Lab Results  Component Value Date   TSH 1.12 10/23/2021   Lab Results  Component Value Date   LITHIUM 0.75 11/25/2021   NA 138 12/01/2021   BUN 10 12/01/2021   CREATININE 1.03 (H) 12/01/2021   TSH 1.12 10/23/2021   WBC 7.2 12/01/2021      Total time spent on today's visit was  ***60 minutes, including both face-to-face time and nonface-to-face time.  Time included that spent on review of records (prior notes available to me/labs/imaging if pertinent), discussing treatment and goals, answering patient's questions and coordinating care.  CC:  Ann Held, DO

## 2021-12-01 NOTE — Telephone Encounter (Signed)
FYI---Pt is at the ED for altered mental status   Is there anything on he med list she should not take with this medication?

## 2021-12-01 NOTE — ED Provider Notes (Signed)
[facility] Provider Note   CSN: [id number] Arrival date & time: 12/01/21  0550     History  Chief Complaint  Patient presents with   Altered Mental Status    Megan Duncan is a 76 y.o. female.  The history is provided by the patient, the spouse and medical records.  Altered Mental Status Megan Duncan is a 76 y.o. female who presents to the Emergency Department complaining of multiple complaints.  She presents to the ED accompanied by her husband who provides the majority of the hx. She has been evaluated recently in the ED as well PCP office.  Her ED visit one week ago was for a lithium check.  Since her recent visit she has experienced worsening anxiety as well as sob.  Her SOB is worse with lying down but it is present with sitting.  She has been unable to sleep at night for the last three nights but sleeps during the day without difficulty.  During the day she is able to sleep lying down.    She has associated nausea for one month or more.    She now has hallucinations that started in the last week - both auditory and visual.  She states things like I want to go to the bedroom and she is already there or requesting to go to her home and she is already home.  Appears to be responding to stimuli that is not present.    No fever.  Complains of back pain (for the last two years).   Complained of right sided abdominal pain several days ago - now gone.  No cough. No dysuria.   She requested to go to the hospital tonight due to her ongoing back pain.  Her husband is concerned that she is not eating enough.    Has a hx/o DM, HTN, dementia - recently diagnosed. Currently scheduled for outpt MRI in the next week for further work up of dementia.    When patient questioned about her history she states that she is experience back pain, left upper quadrant abdominal pain, nausea.  She states that she does not want to eat due to nausea and abdominal pain.   She also states that she does not know what happened earlier and she became very anxious and upset with her husband.   Home Medications Prior to Admission medications   Medication Sig Start Date End Date Taking? Authorizing Provider  albuterol (VENTOLIN HFA) 108 (90 Base) MCG/ACT inhaler Inhale 2 puffs into the lungs every 4 (four) hours as needed. For shortness of breath and wheezing 11/27/21   Carollee Herter, Alferd Apa, DO  ALPRAZolam Duanne Moron) 0.5 MG tablet Take 30 min prior to procedure 11/27/21   Carollee Herter, Alferd Apa, DO  amLODipine (NORVASC) 5 MG tablet TAKE 1 TABLET (5 MG TOTAL) BY MOUTH DAILY. 11/29/21   Roma Schanz R, DO  blood glucose meter kit and supplies Dispense based on patient and insurance preference. Use once a day.  DX Code: E11.9 04/15/18   Carollee Herter, Alferd Apa, DO  cephALEXin (KEFLEX) 500 MG capsule Take 1 capsule (500 mg total) by mouth 2 (two) times daily. 07/13/21   Ann Held, DO  clorazepate (TRANXENE) 3.75 MG tablet Take 1 tablet (3.75 mg total) by mouth 2 (two) times daily as needed for anxiety or sleep. 06/21/21   Donnal Moat T, PA-C  DULoxetine (CYMBALTA) 30 MG capsule Take 1 capsule (30 mg total) by mouth  daily. For 2 weeks, then start 60 mg daily. 10/09/21   Donnal Moat T, PA-C  DULoxetine (CYMBALTA) 60 MG capsule TAKE 1 CAPSULE BY MOUTH EVERY DAY IN THE MORNING, after taking 30 mg for 2 weeks. 10/09/21   Donnal Moat T, PA-C  glucose blood test strip Use as instructed once a day.  Dx Code: E11.9 06/20/20   Carollee Herter, Alferd Apa, DO  hydrocortisone-pramoxine (ANALPRAM HC) 2.5-1 % rectal cream Place 1 application rectally 3 (three) times daily. 08/01/15   Midge Minium, MD  Lancets MISC Use as directed once a day.  Dx code: E11.9 01/11/20   Carollee Herter, Alferd Apa, DO  levothyroxine (SYNTHROID) 50 MCG tablet TAKE 1 TABLET BY MOUTH DAILY BEFORE BREAKFAST 06/19/21   Carollee Herter, Kendrick Fries R, DO  lithium carbonate 150 MG capsule TAKE 1 CAPSULE (150 MG TOTAL) BY  MOUTH DAILY, TAKE WITH LITHIUM 600 MG TO EQUAL 750 MG TOTAL 11/08/21   Donnal Moat T, PA-C  lithium carbonate 300 MG capsule TAKE 2 CAPSULES (600 MG) BY MOUTH AT BEDTIME, WITH 150 MG CAPSULE 03/24/21   Hurst, [name] T, PA-C  metFORMIN (GLUCOPHAGE) 1000 MG tablet Take 1 tablet (1,000 mg total) by mouth 2 (two) times daily with a meal. 11/29/21   Carollee Herter, Yvonne R, DO  ondansetron (ZOFRAN) 4 MG tablet Take 1 tablet (4 mg total) by mouth every 8 (eight) hours as needed for nausea or vomiting. 11/25/21   Hayden Rasmussen, MD  propranolol (INDERAL) 20 MG tablet Take 1 tablet (20 mg total) by mouth 3 (three) times daily. 11/02/21   Donnal Moat T, PA-C  QUEtiapine (SEROQUEL) 50 MG tablet Take 1 tablet (50 mg total) by mouth at bedtime. 11/30/21   Roma Schanz R, DO  traZODone (DESYREL) 50 MG tablet TAKE 1/2 TO 2 TABLETS BY MOUTH EVERY DAY AT BEDTIME AS NEEDED FOR SLEEP 11/17/21   Donnal Moat T, PA-C      Allergies    Atorvastatin, Codeine, Diclofenac, Doxycycline, and Sulfonamide derivatives    Review of Systems   Review of Systems  All other systems reviewed and are negative.  Physical Exam Updated Vital Signs BP 104/80    Pulse (!) 45    Temp 98 F (36.7 C) (Oral)    Resp 17    Ht '5\' 2"'  (1.575 m)    Wt 81.6 kg    SpO2 98%    BMI 32.90 kg/m  Physical Exam Vitals and nursing note reviewed.  Constitutional:      Appearance: She is well-developed.  HENT:     Head: Normocephalic and atraumatic.  Cardiovascular:     Rate and Rhythm: Normal rate and regular rhythm.     Heart sounds: No murmur heard. Pulmonary:     Effort: Pulmonary effort is normal. No respiratory distress.     Breath sounds: Normal breath sounds.  Abdominal:     Palpations: Abdomen is soft.     Tenderness: There is no guarding or rebound.     Comments: Moderate left upper quadrant abdominal tenderness.    Musculoskeletal:        General: No tenderness.  Skin:    General: Skin is warm and dry.  Neurological:      Mental Status: She is alert and oriented to person, place, and time.     Comments: 2+ DP pulses bilaterally.  5/5 strength in all four extremities.  No asymmetry of facial movement.  Mildly confused.  Oriented to person, place, disoriented  to time.    Psychiatric:     Comments: Mildly anxious     ED Results / Procedures / Treatments   Labs (all labs ordered are listed, but only abnormal results are displayed) Labs Reviewed  URINE CULTURE  CBC WITH DIFFERENTIAL/PLATELET  COMPREHENSIVE METABOLIC PANEL  LIPASE, BLOOD  URINALYSIS, ROUTINE W REFLEX MICROSCOPIC  TROPONIN I (HIGH SENSITIVITY)    EKG EKG Interpretation  Date/Time:  Friday December 01 2021 06:16:09 EST Ventricular Rate:  131 PR Interval:    QRS Duration: 197 QT Interval:  331 QTC Calculation: 500 R Axis:   137 Text Interpretation: Poor data quality in current ECG precludes serial comparison indeterminant rhythm narrow qrs Confirmed by Quintella Reichert [phone]) on 12/01/2021 6:19:27 AM  Radiology No results found.  Procedures Procedures    Medications Ordered in ED Medications - No data to display  ED Course/ Medical Decision Making/ A&P                           Medical Decision Making Amount and/or Complexity of Data Reviewed Labs: ordered. Radiology: ordered.   Patient with history of hypertension, diabetes and recent diagnosis of early dementia here for evaluation of worsening hallucinations.  She has been experiencing nausea and abdominal pain for the last week to month.  She does have focal left upper quadrant tenderness on examination.  She is scheduled for outpatient imaging of her brain to occur in the next week but has not to this point had imaging of her brain.  Records reviewed in epic.  She has had multiple recent visits as well as recent lab work-ups.  She has had her thyroid checked recently.  At this point in time plan to check urinalysis to rule out acute UTI as well as CT head to rule out  subacute CVA.  Plan to check CT abdomen pelvis to further evaluate her abdominal pain.  Patient care transferred pending imaging, urinalysis.        Final Clinical Impression(s) / ED Diagnoses Final diagnoses:  None    Rx / DC Orders ED Discharge Orders     None         Quintella Reichert, MD 12/01/21 [phone]

## 2021-12-01 NOTE — ED Provider Notes (Signed)
Signout from Dr. Ralene Bathe.  76 year old female here with worsening anxiety shortness of breath abdominal pain.  Seen recently by me.  She is pending CT head and CT abdomen and pelvis.  Disposition per results of testing. Physical Exam  BP 104/80    Pulse (!) 45    Temp 98 F (36.7 C) (Oral)    Resp 17    Ht 5\' 2"  (1.575 m)    Wt 81.6 kg    SpO2 98%    BMI 32.90 kg/m   Physical Exam  Procedures  Procedures  ED Course / MDM    Medical Decision Making Amount and/or Complexity of Data Reviewed Labs: ordered. Radiology: ordered.  Risk Prescription drug management.  Patient's head CT and abdominal CT did not show any acute findings.  Reviewed with patient and daughter.  Recommended close follow-up with PCP.  No clear indications for admission at this time.  Return instructions discussed.  Will cover with antibiotics for possible urinary tract infection.       Hayden Rasmussen, MD 12/01/21 [phone]

## 2021-12-01 NOTE — Discharge Instructions (Signed)
You were seen in the emergency department for multiple symptoms including confusion nausea tremor and upper abdominal pain.  You had lab work urinalysis CAT scan of your head and CAT scan of your abdomen.  We are treating you for possible urinary tract infection.  Please continue working with your primary care doctor and follow-up with a neurologist as scheduled.  Return if any worsening or concerning symptoms

## 2021-12-02 ENCOUNTER — Telehealth: Payer: Self-pay | Admitting: Physician Assistant

## 2021-12-02 ENCOUNTER — Emergency Department (EMERGENCY_DEPARTMENT_HOSPITAL)
Admission: EM | Admit: 2021-12-02 | Discharge: 2021-12-03 | Disposition: A | Discharge status: Other Acute Inpatient Hospital | Payer: Medicare Other | Source: Home / Self Care | Attending: Emergency Medicine | Admitting: Emergency Medicine

## 2021-12-02 ENCOUNTER — Other Ambulatory Visit: Payer: Self-pay

## 2021-12-02 DIAGNOSIS — F03C11 Unspecified dementia, severe, with agitation: Secondary | ICD-10-CM | POA: Diagnosis present

## 2021-12-02 DIAGNOSIS — F3181 Bipolar II disorder: Secondary | ICD-10-CM | POA: Diagnosis present

## 2021-12-02 DIAGNOSIS — M4316 Spondylolisthesis, lumbar region: Secondary | ICD-10-CM | POA: Diagnosis present

## 2021-12-02 DIAGNOSIS — F315 Bipolar disorder, current episode depressed, severe, with psychotic features: Secondary | ICD-10-CM | POA: Diagnosis not present

## 2021-12-02 DIAGNOSIS — G8929 Other chronic pain: Secondary | ICD-10-CM | POA: Diagnosis present

## 2021-12-02 DIAGNOSIS — J309 Allergic rhinitis, unspecified: Secondary | ICD-10-CM | POA: Diagnosis present

## 2021-12-02 DIAGNOSIS — R440 Auditory hallucinations: Secondary | ICD-10-CM | POA: Diagnosis present

## 2021-12-02 DIAGNOSIS — Y900 Blood alcohol level of less than 20 mg/100 ml: Secondary | ICD-10-CM | POA: Insufficient documentation

## 2021-12-02 DIAGNOSIS — M797 Fibromyalgia: Secondary | ICD-10-CM | POA: Diagnosis present

## 2021-12-02 DIAGNOSIS — F03C4 Unspecified dementia, severe, with anxiety: Secondary | ICD-10-CM | POA: Diagnosis present

## 2021-12-02 DIAGNOSIS — E785 Hyperlipidemia, unspecified: Secondary | ICD-10-CM | POA: Diagnosis present

## 2021-12-02 DIAGNOSIS — J449 Chronic obstructive pulmonary disease, unspecified: Secondary | ICD-10-CM | POA: Diagnosis present

## 2021-12-02 DIAGNOSIS — Z9049 Acquired absence of other specified parts of digestive tract: Secondary | ICD-10-CM | POA: Diagnosis not present

## 2021-12-02 DIAGNOSIS — Z90710 Acquired absence of both cervix and uterus: Secondary | ICD-10-CM | POA: Diagnosis not present

## 2021-12-02 DIAGNOSIS — F03C3 Unspecified dementia, severe, with mood disturbance: Secondary | ICD-10-CM | POA: Diagnosis present

## 2021-12-02 DIAGNOSIS — E119 Type 2 diabetes mellitus without complications: Secondary | ICD-10-CM | POA: Diagnosis present

## 2021-12-02 DIAGNOSIS — I10 Essential (primary) hypertension: Secondary | ICD-10-CM | POA: Diagnosis present

## 2021-12-02 DIAGNOSIS — M5136 Other intervertebral disc degeneration, lumbar region: Secondary | ICD-10-CM | POA: Diagnosis present

## 2021-12-02 DIAGNOSIS — F411 Generalized anxiety disorder: Secondary | ICD-10-CM | POA: Diagnosis present

## 2021-12-02 DIAGNOSIS — R4781 Slurred speech: Secondary | ICD-10-CM | POA: Diagnosis present

## 2021-12-02 DIAGNOSIS — F03C2 Unspecified dementia, severe, with psychotic disturbance: Secondary | ICD-10-CM

## 2021-12-02 DIAGNOSIS — Z8249 Family history of ischemic heart disease and other diseases of the circulatory system: Secondary | ICD-10-CM | POA: Diagnosis not present

## 2021-12-02 DIAGNOSIS — M5416 Radiculopathy, lumbar region: Secondary | ICD-10-CM | POA: Diagnosis present

## 2021-12-02 DIAGNOSIS — R4182 Altered mental status, unspecified: Secondary | ICD-10-CM | POA: Diagnosis not present

## 2021-12-02 DIAGNOSIS — Z20822 Contact with and (suspected) exposure to covid-19: Secondary | ICD-10-CM | POA: Diagnosis present

## 2021-12-02 DIAGNOSIS — F05 Delirium due to known physiological condition: Secondary | ICD-10-CM | POA: Diagnosis present

## 2021-12-02 DIAGNOSIS — F03C18 Unspecified dementia, severe, with other behavioral disturbance: Secondary | ICD-10-CM | POA: Diagnosis present

## 2021-12-02 LAB — TSH: TSH: 0.693 u[IU]/mL (ref 0.350–4.500)

## 2021-12-02 LAB — CBC
HCT: 45.8 % (ref 36.0–46.0)
Hemoglobin: 14.3 g/dL (ref 12.0–15.0)
MCH: 31.3 pg (ref 26.0–34.0)
MCHC: 31.2 g/dL (ref 30.0–36.0)
MCV: 100.2 fL — ABNORMAL HIGH (ref 80.0–100.0)
Platelets: 357 10*3/uL (ref 150–400)
RBC: 4.57 MIL/uL (ref 3.87–5.11)
RDW: 13.7 % (ref 11.5–15.5)
WBC: 10.3 10*3/uL (ref 4.0–10.5)
nRBC: 0 % (ref 0.0–0.2)

## 2021-12-02 LAB — COMPREHENSIVE METABOLIC PANEL
ALT: 20 U/L (ref 0–44)
AST: 24 U/L (ref 15–41)
Albumin: 4.3 g/dL (ref 3.5–5.0)
Alkaline Phosphatase: 71 U/L (ref 38–126)
Anion gap: 11 (ref 5–15)
BUN: 12 mg/dL (ref 8–23)
CO2: 21 mmol/L — ABNORMAL LOW (ref 22–32)
Calcium: 9.7 mg/dL (ref 8.9–10.3)
Chloride: 106 mmol/L (ref 98–111)
Creatinine, Ser: 0.81 mg/dL (ref 0.44–1.00)
GFR, Estimated: >60 mL/min (ref >60)
Glucose, Bld: 128 mg/dL — ABNORMAL HIGH (ref 70–99)
Potassium: 3.9 mmol/L (ref 3.5–5.1)
Sodium: 138 mmol/L (ref 135–145)
Total Bilirubin: 0.9 mg/dL (ref 0.3–1.2)
Total Protein: 7.3 g/dL (ref 6.5–8.1)

## 2021-12-02 LAB — CBC WITH DIFFERENTIAL/PLATELET
Abs Immature Granulocytes: 0.03 10*3/uL (ref 0.00–0.07)
Basophils Absolute: 0.1 10*3/uL (ref 0.0–0.1)
Basophils Relative: 1 %
Eosinophils Absolute: 0.3 10*3/uL (ref 0.0–0.5)
Eosinophils Relative: 3 %
HCT: 44 % (ref 36.0–46.0)
Hemoglobin: 13.9 g/dL (ref 12.0–15.0)
Immature Granulocytes: 0 %
Lymphocytes Relative: 20 %
Lymphs Abs: 1.9 10*3/uL (ref 0.7–4.0)
MCH: 31.1 pg (ref 26.0–34.0)
MCHC: 31.6 g/dL (ref 30.0–36.0)
MCV: 98.4 fL (ref 80.0–100.0)
Monocytes Absolute: 1 10*3/uL (ref 0.1–1.0)
Monocytes Relative: 10 %
Neutro Abs: 6.3 10*3/uL (ref 1.7–7.7)
Neutrophils Relative %: 66 %
Platelets: 332 10*3/uL (ref 150–400)
RBC: 4.47 MIL/uL (ref 3.87–5.11)
RDW: 13.6 % (ref 11.5–15.5)
WBC: 9.6 10*3/uL (ref 4.0–10.5)
nRBC: 0 % (ref 0.0–0.2)

## 2021-12-02 LAB — ETHANOL: Alcohol, Ethyl (B): <10 mg/dL (ref <10)

## 2021-12-02 LAB — LACTIC ACID, PLASMA: Lactic Acid, Venous: 1.2 mmol/L (ref 0.5–1.9)

## 2021-12-02 LAB — SALICYLATE LEVEL: Salicylate Lvl: <7 mg/dL — ABNORMAL LOW (ref 7.0–30.0)

## 2021-12-02 LAB — URINE CULTURE: Culture: NO GROWTH

## 2021-12-02 LAB — ACETAMINOPHEN LEVEL: Acetaminophen (Tylenol), Serum: <10 ug/mL — ABNORMAL LOW (ref 10–30)

## 2021-12-02 LAB — TROPONIN I (HIGH SENSITIVITY): Troponin I (High Sensitivity): 6 ng/L (ref <18)

## 2021-12-02 LAB — LITHIUM LEVEL: Lithium Lvl: 1.39 mmol/L — ABNORMAL HIGH (ref 0.60–1.20)

## 2021-12-02 NOTE — BH Assessment (Addendum)
Pt's daughter called the [facility] behavioral health line [phone]) seeking gero-psych services for her mother. Pt's daughter reporting that pt is a danger to herself and others. Daughter reports mother has been very confused, agitated, and combative toward family members. She further reports that pt has been seen in the ED several times over the past week. She reports her mother's mental state has been progressively declining and they are concerned for her safety. Pt's daughter requested that pt receive a psychiatric evaluation.  Per chart review, pt had been endorsing auditory and visual hallucinations when she presented to the ED yesterday. Daughter is unsure if pt has been compliant with her current psychotropic medications. Pt also prescribed abx for recently diagnosed UTI.  [facility] strongly encouraged that daughter have pt present to ED for evaluation. [facility] also offered the following crisis response options to pt's daughter: present to ED, call Mobile Crisis (number provided: [phone]), or going to [facility] office and placing pt under IVC to ensure safety of self and others.

## 2021-12-02 NOTE — ED Notes (Signed)
This RN received call from [facility] w/ [facility]; states she received a call from patients daughter wanting to have a psych eval done.  This RN advised that she would have to be triaged first to determine that patient did not need to be seen medically as well.

## 2021-12-02 NOTE — ED Notes (Signed)
Pt given ice water per her request. 

## 2021-12-02 NOTE — Telephone Encounter (Signed)
Daughter [name] called, pt went to ER twice in the past week. 11/30/2021 dx w/ UTI and sent home.  She has had 3 doses of Keflex but has refused any meds today.  In the past 12 to 24 hours she has gotten more confused, daughter states she is delirious now, is throwing things at her and her stepdad, threatening to hurt them and herself saying 'I can't take this anymore.' Then [name] will start calling for her mother, attempts to go downstairs, extremely unsafe d/t her physical condition.  Advised her to take patient back to ER w/ declining mental status and threat to herself and others. [name] has talked with several people who have recommended she either go to [facility] or the ER in [place] b/c they both have psych geriatric unit. Either one is fine, she just needs to go. If she is unable to get down the stairs safely (bedroom upstairs) or pt more combative, call 911.

## 2021-12-02 NOTE — ED Triage Notes (Addendum)
Pt with husband and daughter- states that 1 week ago pt started having panic attacks, tremors, anxiety, and s/s of worsening dementia. Pt is alert, oriented to self only, states "I don't want to go to jail." Pt is taking antibiotics since yesterday for UTI. Daughter states pt has been more confrontational, insomnia, agitation, and will take medication but will not eat very much. Pt has been drinking water, urinary frequency has improved per daughter. Pt able to move all four extremities.

## 2021-12-03 ENCOUNTER — Inpatient Hospital Stay
Admission: AD | Admit: 2021-12-03 | Discharge: 2021-12-22 | DRG: 948 | Disposition: A | Discharge status: Home / Self Care | Payer: Medicare Other | Source: Intra-hospital | Attending: Psychiatry | Admitting: Psychiatry

## 2021-12-03 ENCOUNTER — Encounter: Payer: Self-pay | Admitting: Psychiatric/Mental Health

## 2021-12-03 DIAGNOSIS — R440 Auditory hallucinations: Secondary | ICD-10-CM | POA: Diagnosis present

## 2021-12-03 DIAGNOSIS — F3181 Bipolar II disorder: Secondary | ICD-10-CM | POA: Diagnosis present

## 2021-12-03 DIAGNOSIS — F03C3 Unspecified dementia, severe, with mood disturbance: Secondary | ICD-10-CM | POA: Diagnosis present

## 2021-12-03 DIAGNOSIS — F03C4 Unspecified dementia, severe, with anxiety: Secondary | ICD-10-CM | POA: Diagnosis present

## 2021-12-03 DIAGNOSIS — F03C18 Unspecified dementia, severe, with other behavioral disturbance: Secondary | ICD-10-CM | POA: Diagnosis present

## 2021-12-03 DIAGNOSIS — Z8249 Family history of ischemic heart disease and other diseases of the circulatory system: Secondary | ICD-10-CM | POA: Diagnosis not present

## 2021-12-03 DIAGNOSIS — M797 Fibromyalgia: Secondary | ICD-10-CM | POA: Diagnosis present

## 2021-12-03 DIAGNOSIS — Z818 Family history of other mental and behavioral disorders: Secondary | ICD-10-CM

## 2021-12-03 DIAGNOSIS — M4316 Spondylolisthesis, lumbar region: Secondary | ICD-10-CM | POA: Diagnosis present

## 2021-12-03 DIAGNOSIS — F411 Generalized anxiety disorder: Secondary | ICD-10-CM | POA: Diagnosis present

## 2021-12-03 DIAGNOSIS — G8929 Other chronic pain: Secondary | ICD-10-CM | POA: Diagnosis present

## 2021-12-03 DIAGNOSIS — F03C11 Unspecified dementia, severe, with agitation: Secondary | ICD-10-CM | POA: Diagnosis present

## 2021-12-03 DIAGNOSIS — Z833 Family history of diabetes mellitus: Secondary | ICD-10-CM

## 2021-12-03 DIAGNOSIS — E785 Hyperlipidemia, unspecified: Secondary | ICD-10-CM | POA: Diagnosis present

## 2021-12-03 DIAGNOSIS — F05 Delirium due to known physiological condition: Secondary | ICD-10-CM | POA: Diagnosis present

## 2021-12-03 DIAGNOSIS — Z9049 Acquired absence of other specified parts of digestive tract: Secondary | ICD-10-CM

## 2021-12-03 DIAGNOSIS — J449 Chronic obstructive pulmonary disease, unspecified: Secondary | ICD-10-CM | POA: Diagnosis present

## 2021-12-03 DIAGNOSIS — M5136 Other intervertebral disc degeneration, lumbar region: Secondary | ICD-10-CM | POA: Diagnosis present

## 2021-12-03 DIAGNOSIS — E119 Type 2 diabetes mellitus without complications: Secondary | ICD-10-CM | POA: Diagnosis present

## 2021-12-03 DIAGNOSIS — M5416 Radiculopathy, lumbar region: Secondary | ICD-10-CM | POA: Diagnosis present

## 2021-12-03 DIAGNOSIS — R4781 Slurred speech: Secondary | ICD-10-CM | POA: Diagnosis present

## 2021-12-03 DIAGNOSIS — Z20822 Contact with and (suspected) exposure to covid-19: Secondary | ICD-10-CM | POA: Diagnosis present

## 2021-12-03 DIAGNOSIS — J309 Allergic rhinitis, unspecified: Secondary | ICD-10-CM | POA: Diagnosis present

## 2021-12-03 DIAGNOSIS — Z90710 Acquired absence of both cervix and uterus: Secondary | ICD-10-CM

## 2021-12-03 DIAGNOSIS — Z881 Allergy status to other antibiotic agents status: Secondary | ICD-10-CM

## 2021-12-03 DIAGNOSIS — I10 Essential (primary) hypertension: Secondary | ICD-10-CM | POA: Diagnosis present

## 2021-12-03 DIAGNOSIS — Z885 Allergy status to narcotic agent status: Secondary | ICD-10-CM

## 2021-12-03 DIAGNOSIS — Z882 Allergy status to sulfonamides status: Secondary | ICD-10-CM

## 2021-12-03 DIAGNOSIS — Z888 Allergy status to other drugs, medicaments and biological substances status: Secondary | ICD-10-CM

## 2021-12-03 DIAGNOSIS — R682 Dry mouth, unspecified: Secondary | ICD-10-CM | POA: Diagnosis not present

## 2021-12-03 DIAGNOSIS — R4182 Altered mental status, unspecified: Secondary | ICD-10-CM | POA: Diagnosis not present

## 2021-12-03 DIAGNOSIS — F315 Bipolar disorder, current episode depressed, severe, with psychotic features: Secondary | ICD-10-CM | POA: Diagnosis not present

## 2021-12-03 DIAGNOSIS — Z87891 Personal history of nicotine dependence: Secondary | ICD-10-CM

## 2021-12-03 LAB — BLOOD GAS, VENOUS
Acid-base deficit: 3.3 mmol/L — ABNORMAL HIGH (ref 0.0–2.0)
Bicarbonate: 24 mmol/L (ref 20.0–28.0)
O2 Saturation: 54.4 %
Patient temperature: 37
pCO2, Ven: 51 mmHg (ref 44.0–60.0)
pH, Ven: 7.28 (ref 7.250–7.430)
pO2, Ven: 33 mmHg (ref 32.0–45.0)

## 2021-12-03 LAB — RESP PANEL BY RT-PCR (FLU A&B, COVID) ARPGX2
Influenza A by PCR: NEGATIVE
Influenza B by PCR: NEGATIVE
SARS Coronavirus 2 by RT PCR: NEGATIVE

## 2021-12-03 LAB — TROPONIN I (HIGH SENSITIVITY): Troponin I (High Sensitivity): 5 ng/L (ref <18)

## 2021-12-03 LAB — URINE DRUG SCREEN, QUALITATIVE (ARMC ONLY)
Amphetamines, Ur Screen: NOT DETECTED
Barbiturates, Ur Screen: NOT DETECTED
Benzodiazepine, Ur Scrn: POSITIVE — AB
Cannabinoid 50 Ng, Ur ~~LOC~~: NOT DETECTED
Cocaine Metabolite,Ur ~~LOC~~: NOT DETECTED
MDMA (Ecstasy)Ur Screen: NOT DETECTED
Methadone Scn, Ur: NOT DETECTED
Opiate, Ur Screen: NOT DETECTED
Phencyclidine (PCP) Ur S: NOT DETECTED
Tricyclic, Ur Screen: NOT DETECTED

## 2021-12-03 LAB — URINALYSIS, ROUTINE W REFLEX MICROSCOPIC
Bacteria, UA: NONE SEEN
Bilirubin Urine: NEGATIVE
Glucose, UA: NEGATIVE mg/dL
Hgb urine dipstick: NEGATIVE
Ketones, ur: 80 mg/dL — AB
Nitrite: NEGATIVE
Protein, ur: NEGATIVE mg/dL
Specific Gravity, Urine: 1.014 (ref 1.005–1.030)
pH: 6 (ref 5.0–8.0)

## 2021-12-03 LAB — AMMONIA: Ammonia: 16 umol/L (ref 9–35)

## 2021-12-03 MED ORDER — OLANZAPINE 10 MG IM SOLR
5.0000 mg | Freq: Once | INTRAMUSCULAR | Status: AC
  Administered 2021-12-03: 5 mg via INTRAMUSCULAR
  Filled 2021-12-03: qty 10

## 2021-12-03 MED ORDER — LEVOTHYROXINE SODIUM 50 MCG PO TABS
50.0000 ug | ORAL_TABLET | Freq: Every day | ORAL | Status: DC
  Administered 2021-12-04 – 2021-12-22 (×17): 50 ug via ORAL
  Filled 2021-12-03 (×19): qty 1

## 2021-12-03 MED ORDER — METFORMIN HCL 500 MG PO TABS
1000.0000 mg | ORAL_TABLET | Freq: Two times a day (BID) | ORAL | Status: DC
  Administered 2021-12-03 – 2021-12-04 (×3): 1000 mg via ORAL
  Filled 2021-12-03 (×4): qty 2

## 2021-12-03 MED ORDER — LITHIUM CARBONATE 150 MG PO CAPS
150.0000 mg | ORAL_CAPSULE | Freq: Every day | ORAL | Status: DC
  Filled 2021-12-03: qty 1

## 2021-12-03 MED ORDER — MAGNESIUM HYDROXIDE 400 MG/5ML PO SUSP: 30.0000 mL | Freq: Every day | ORAL | Status: DC | PRN

## 2021-12-03 MED ORDER — RISPERIDONE 1 MG PO TABS: 1.0000 mg | ORAL_TABLET | ORAL | Status: DC

## 2021-12-03 MED ORDER — LORAZEPAM 1 MG PO TABS
1.0000 mg | ORAL_TABLET | ORAL | Status: DC | PRN
  Administered 2021-12-03 – 2021-12-21 (×11): 1 mg via ORAL
  Filled 2021-12-03 (×12): qty 1

## 2021-12-03 MED ORDER — ACETAMINOPHEN 325 MG PO TABS
650.0000 mg | ORAL_TABLET | Freq: Four times a day (QID) | ORAL | Status: DC | PRN
  Administered 2021-12-03 – 2021-12-21 (×16): 650 mg via ORAL
  Filled 2021-12-03 (×17): qty 2

## 2021-12-03 MED ORDER — DULOXETINE HCL 60 MG PO CPEP
60.0000 mg | ORAL_CAPSULE | Freq: Every day | ORAL | Status: DC
  Administered 2021-12-03 – 2021-12-22 (×20): 60 mg via ORAL
  Filled 2021-12-03 (×20): qty 1

## 2021-12-03 MED ORDER — ALUM & MAG HYDROXIDE-SIMETH 200-200-20 MG/5ML PO SUSP: 30.0000 mL | ORAL | Status: DC | PRN

## 2021-12-03 MED ORDER — PROPRANOLOL HCL 20 MG PO TABS
20.0000 mg | ORAL_TABLET | Freq: Three times a day (TID) | ORAL | Status: DC
  Administered 2021-12-03 – 2021-12-18 (×32): 20 mg via ORAL
  Filled 2021-12-03 (×49): qty 1

## 2021-12-03 MED ORDER — OLANZAPINE 5 MG PO TABS
5.0000 mg | ORAL_TABLET | ORAL | Status: DC | PRN
  Administered 2021-12-03 – 2021-12-16 (×4): 5 mg via ORAL
  Filled 2021-12-03 (×6): qty 1

## 2021-12-03 MED ORDER — QUETIAPINE FUMARATE 25 MG PO TABS
50.0000 mg | ORAL_TABLET | Freq: Every day | ORAL | Status: DC
  Administered 2021-12-03: 50 mg via ORAL
  Filled 2021-12-03: qty 2

## 2021-12-03 MED ORDER — RISPERIDONE 1 MG PO TABS
1.0000 mg | ORAL_TABLET | ORAL | Status: DC
  Administered 2021-12-03: 1 mg via ORAL

## 2021-12-03 MED ORDER — RISPERIDONE 1 MG PO TABS
0.5000 mg | ORAL_TABLET | ORAL | Status: DC
  Administered 2021-12-03: 0.5 mg via ORAL
  Filled 2021-12-03 (×2): qty 1

## 2021-12-03 MED ORDER — LITHIUM CARBONATE 300 MG PO CAPS
600.0000 mg | ORAL_CAPSULE | Freq: Every day | ORAL | Status: DC
  Filled 2021-12-03: qty 2

## 2021-12-03 MED ORDER — ACETAMINOPHEN 325 MG PO TABS
ORAL_TABLET | ORAL | Status: AC
  Filled 2021-12-03: qty 2

## 2021-12-03 NOTE — BH Assessment (Addendum)
Patient is to be admitted to [facility] by Psychiatric Nurse Practitioner [place].  Attending Physician will be Dr.  Louis Meckel .   Patient has been assigned to room L29, by [facility] Charge Nurse Oleta Mouse.   Intake Paper Work has been signed and placed on patient chart.  ER staff is aware of the admission: Tye Maryland, ER Secretary   Dr. Alfred Levins, ER MD  Donella Stade, Patient's Nurse  Helene Kelp, Patient Access.   Pt can be transported 12/03/21 anytime before 5 AM.

## 2021-12-03 NOTE — BH Assessment (Signed)
This Probation officer spoke with pt's family to discuss pt's plan of care. Family demonstrated an understanding of the information provided and requested updates throughout the pt's encounter.

## 2021-12-03 NOTE — H&P (Signed)
Psychiatric Admission Assessment Adult  Patient Identification: Megan Duncan MRN:  [id number] Date of Evaluation:  12/03/2021 Chief Complaint:  Altered mental status [R41.82] Principal Diagnosis: Altered mental status Diagnosis:  Principal Problem:   Altered mental status  History of Present Illness:   Megan Duncan is a 76 year old white female who presented to the emergency room at the request of her daughter for worsening cognition, agitation, and depression.  When I spoke to Megan Duncan she does not know why she is in the psychiatric hospital.  She states that she lives with her husband but her husband is having an affair with her sister.  I asked her how she knows that she says they told her that.  She does see a psychiatrist in [place] but does not remember the name.  She is on lithium. Her level was  1.39 on 1/21.  She admits that she does not remember much of what happened the last couple days.  I asked her if she has been treated for a UTI and she said no.  She denies any suicidal ideation.  She states that she is depressed because she is here.  It sounds as if the family would like her medications adjusted.  I asked her if that was okay and she said yes.   PER INITIAL INTAKE:  Megan Duncan, 76 y.o., female patient seen by TTS and this provider; chart reviewed and consulted with Dr. Dwyane Dee on 12/03/21.  On evaluation Megan Duncan reports per TTS, pt's daughter reporting that pt is a danger to herself and others. Daughter reports mother has been very confused, agitated, and combative toward family members. She further reports that pt has been seen in the ED several times over the past week. She reports her mother's mental state has been progressively declining and they are concerned for her safety. Pt's daughter requested that pt receive a psychiatric evaluation.   Per chart review, pt had been endorsing auditory and visual hallucinations when she presented to the ED yesterday.  Daughter is unsure if pt has been compliant with her current psychotropic medications. Pt also prescribed abx for recently diagnosed UTI.  Associated Signs/Symptoms: Depression Symptoms:  disturbed sleep, Duration of Depression Symptoms: No data recorded (Hypo) Manic Symptoms:  Irritable Mood, Anxiety Symptoms:  Excessive Worry, Psychotic Symptoms:  Paranoia, PTSD Symptoms: Negative Total Time spent with patient: 1 hour  Past Psychiatric History: Sees an out-patient psychiatrist in [place]. She has never been psychiatrically hospitalized  Is the patient at risk to self? No.  Has the patient been a risk to self in the past 6 months? No.  Has the patient been a risk to self within the distant past? No.  Is the patient a risk to others? No.  Has the patient been a risk to others in the past 6 months? No.  Has the patient been a risk to others within the distant past? No.   Prior Inpatient Therapy:   Prior Outpatient Therapy:    Alcohol Screening: Patient refused Alcohol Screening Tool: Yes 1. How often do you have a drink containing alcohol?: Monthly or less 2. How many drinks containing alcohol do you have on a typical day when you are drinking?: 1 or 2 3. How often do you have six or more drinks on one occasion?: Never AUDIT-C Score: 1 9. Have you or someone else been injured as a result of your drinking?: No 10. Has a relative or friend or a doctor or another health worker been concerned about  your drinking or suggested you cut down?: No Alcohol Use Disorder Identification Test Final Score (AUDIT): 1 Substance Abuse History in the last 12 months:  No. Consequences of Substance Abuse: NA Previous Psychotropic Medications: Yes  Psychological Evaluations: No  Past Medical History:  Past Medical History:  Diagnosis Date   Acute pain of right shoulder 09/24/2018   Acute upper respiratory infection 09/19/2010   Allergic rhinitis 10/03/2016   Anterolisthesis of lumbar spine     Arthritis    hands   Bilateral hip pain 09/02/2014   Bipolar II disorder    Cellulitis and abscess of trunk 09/15/2009   Ceruminosis 03/04/2014   Cervicalgia 06/20/2007   Chronic low back pain 06/20/2007   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-sided sciatica 08/06/2012   COPD (chronic obstructive pulmonary disease) ([place])    Deformity of finger 07/15/2014   Degeneration of lumbar intervertebral disc 08/22/2020   Degenerative lumbar spinal stenosis    Dermatitis 93/81/8299   Diastolic dysfunction    Per pt, diagnosed after [facility]   Diverticulosis    DOE (dyspnea on exertion) 07/20/2016   Essential (primary) hypertension 04/23/2007   Estrogen deficiency 09/01/2017   External hemorrhoid 08/01/2015   Fibromyalgia    Foraminal stenosis of lumbar region 08/22/2020   Generalized anxiety disorder 09/01/2018   Hyperlipidemia    Inflammatory disease of uterus 04/23/2007   Left flank pain 07/13/2021   Left upper quadrant pain 09/01/2017   Lower abdominal pain 08/06/2012   Lumbar back pain with radiculopathy affecting left lower extremity 01/24/2010   Lumbar radiculopathy 01/26/2021   Lump in upper inner quadrant of left breast 07/13/2021   Major depressive disorder    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   Otitis media 09/05/2010   Palpitations 07/20/2016   Peripheral neuropathy 02/22/2010   Retrolisthesis of vertebrae 08/22/2020   Shingles outbreak 04/14/2015   Sinusitis, acute maxillary 09/14/2013   Stress incontinence, female 08/01/2015   Thoracic aortic atherosclerosis 05/14/2017   Tinea corporis 09/01/2017   Tinnitus 03/04/2014   Torticollis 03/04/2014   Type II diabetes mellitus 06/05/2018    Past Surgical History:  Procedure Laterality Date   APPENDECTOMY     EYE SURGERY     KNEE ARTHROSCOPY Right 11/12/2006   TONSILLECTOMY     TOTAL ABDOMINAL HYSTERECTOMY     Family History:  Family History  Problem Relation Age of Onset    Hypertension Mother    Hiatal hernia Mother    Diabetes Father    Hypertension Father    Heart attack Father    Alcoholism Father    Bipolar disorder Father    Mental illness Sister    Suicidality Sister    Colon cancer Maternal Aunt 21   Breast cancer Maternal Aunt    Bipolar disorder Paternal Aunt    Family Psychiatric  History: Unknown Tobacco Screening:   Social History:  Social History   Substance and Sexual Activity  Alcohol Use Yes   Alcohol/week: 0.0 - 1.0 standard drinks   Comment: occasional wine     Social History   Substance and Sexual Activity  Drug Use No    Additional Social History: Marital status: Married Number of Years Married: 54 Are you sexually active?: No What is your sexual orientation?: Heterosexual Has your sexual activity been affected by drugs, alcohol, medication, or emotional stress?: Patient denies Does patient have children?: Yes How many children?: 1 (Daughter) How is patient's relationship with their children?: Patient is currently upset with  her daughter for bringing her to the hospital.                         Allergies:   Allergies  Allergen Reactions   Atorvastatin Other (See Comments)    Significant rise in liver tests   Codeine Nausea And Vomiting   Diclofenac Other (See Comments)    Elevated LFT's   Doxycycline     Extreme nausea   Sulfonamide Derivatives Swelling   Lab Results:  Results for orders placed or performed during the hospital encounter of 12/02/21 (from the past 48 hour(s))  Comprehensive metabolic panel     Status: Abnormal   Collection Time: 12/02/21  7:57 PM  Result Value Ref Range   Sodium 138 135 - 145 mmol/L   Potassium 3.9 3.5 - 5.1 mmol/L   Chloride 106 98 - 111 mmol/L   CO2 21 (L) 22 - 32 mmol/L   Glucose, Bld 128 (H) 70 - 99 mg/dL    Comment: Glucose reference range applies only to samples taken after fasting for at least 8 hours.   BUN 12 8 - 23 mg/dL   Creatinine, Ser 0.81 0.44 -  1.00 mg/dL   Calcium 9.7 8.9 - 10.3 mg/dL   Total Protein 7.3 6.5 - 8.1 g/dL   Albumin 4.3 3.5 - 5.0 g/dL   AST 24 15 - 41 U/L   ALT 20 0 - 44 U/L   Alkaline Phosphatase 71 38 - 126 U/L   Total Bilirubin 0.9 0.3 - 1.2 mg/dL   GFR, Estimated >60 >60 mL/min    Comment: (NOTE) Calculated using the CKD-EPI Creatinine Equation (2021)    Anion gap 11 5 - 15    Comment: Performed at [facility], [address] [place], [place] [address]  CBC     Status: Abnormal   Collection Time: 12/02/21  7:57 PM  Result Value Ref Range   WBC 10.3 4.0 - 10.5 K/uL   RBC 4.57 3.87 - 5.11 MIL/uL   Hemoglobin 14.3 12.0 - 15.0 g/dL   HCT 45.8 36.0 - 46.0 %   MCV 100.2 (H) 80.0 - 100.0 fL   MCH 31.3 26.0 - 34.0 pg   MCHC 31.2 30.0 - 36.0 g/dL   RDW 13.7 11.5 - 15.5 %   Platelets 357 150 - 400 K/uL   nRBC 0.0 0.0 - 0.2 %    Comment: Performed at [facility], [address] [place], [place] [address]  Blood gas, venous     Status: Abnormal   Collection Time: 12/02/21 10:20 PM  Result Value Ref Range   pH, Ven 7.28 7.250 - 7.430   pCO2, Ven 51 44.0 - 60.0 mmHg   pO2, Ven 33.0 32.0 - 45.0 mmHg   Bicarbonate 24.0 20.0 - 28.0 mmol/L   Acid-base deficit 3.3 (H) 0.0 - 2.0 mmol/L   O2 Saturation 54.4 %   Patient temperature 37.0    Collection site VEIN    Sample type VENOUS     Comment: Performed at [facility], [address] [place], [place] [address]  Urinalysis, Routine w reflex microscopic Urine, Clean Catch     Status: Abnormal   Collection Time: 12/02/21 10:38 PM  Result Value Ref Range   Color, Urine YELLOW (A) YELLOW   APPearance HAZY (A) CLEAR   Specific Gravity, Urine 1.014 1.005 - 1.030   pH 6.0 5.0 - 8.0   Glucose, UA NEGATIVE NEGATIVE mg/dL  Hgb urine dipstick NEGATIVE NEGATIVE   Bilirubin Urine NEGATIVE NEGATIVE   Ketones, ur 80 (A) NEGATIVE mg/dL   Protein, ur NEGATIVE NEGATIVE mg/dL   Nitrite NEGATIVE NEGATIVE   Leukocytes,Ua LARGE (A) NEGATIVE    RBC / HPF 11-20 0 - 5 RBC/hpf   WBC, UA 21-50 0 - 5 WBC/hpf   Bacteria, UA NONE SEEN NONE SEEN   Squamous Epithelial / LPF 6-10 0 - 5   Mucus PRESENT    Hyaline Casts, UA PRESENT     Comment: Performed at [facility], [address] [place], [place] [address]  Acetaminophen level     Status: Abnormal   Collection Time: 12/02/21 10:38 PM  Result Value Ref Range   Acetaminophen (Tylenol), Serum <10 (L) 10 - 30 ug/mL    Comment: (NOTE) Therapeutic concentrations vary significantly. A range of 10-30 ug/mL  may be an effective concentration for many patients. However, some  are best treated at concentrations outside of this range. Acetaminophen concentrations >150 ug/mL at 4 hours after ingestion  and >50 ug/mL at 12 hours after ingestion are often associated with  toxic reactions.  Performed at [facility], [address] [place], [place] [address]   Ethanol     Status: None   Collection Time: 12/02/21 10:38 PM  Result Value Ref Range   Alcohol, Ethyl (B) <10 <10 mg/dL    Comment: (NOTE) Lowest detectable limit for serum alcohol is 10 mg/dL.  For medical purposes only. Performed at [facility], [address] [place], [place] [address]   Lactic acid, plasma     Status: None   Collection Time: 12/02/21 10:38 PM  Result Value Ref Range   Lactic Acid, Venous 1.2 0.5 - 1.9 mmol/L    Comment: Performed at [facility], [address] [place], [place] [address]  Salicylate level     Status: Abnormal   Collection Time: 12/02/21 10:38 PM  Result Value Ref Range   Salicylate Lvl <7.8 (L) 7.0 - 30.0 mg/dL    Comment: Performed at [facility], [place], [place] [address]  Troponin I (High Sensitivity)     Status: None   Collection Time: 12/02/21 10:38 PM  Result Value Ref Range   Troponin I (High Sensitivity) 6 <18 ng/L    Comment: (NOTE) Elevated high sensitivity troponin I (hsTnI) values and significant   changes across serial measurements may suggest ACS but many other  chronic and acute conditions are known to elevate hsTnI results.  Refer to the "Links" section for chest pain algorithms and additional  guidance. Performed at [facility], [address] [place],  [address]   Urine Drug Screen, Qualitative     Status: Abnormal   Collection Time: 12/02/21 10:38 PM  Result Value Ref Range   Tricyclic, Ur Screen NONE DETECTED NONE DETECTED   Amphetamines, Ur Screen NONE DETECTED NONE DETECTED   MDMA (Ecstasy)Ur Screen NONE DETECTED NONE DETECTED   Cocaine Metabolite,Ur [place] NONE DETECTED NONE DETECTED   Opiate, Ur Screen NONE DETECTED NONE DETECTED   Phencyclidine (PCP) Ur S NONE DETECTED NONE DETECTED   Cannabinoid 50 Ng, Ur [place] NONE DETECTED NONE DETECTED   Barbiturates, Ur Screen NONE DETECTED NONE DETECTED   Benzodiazepine, Ur Scrn POSITIVE (A) NONE DETECTED   Methadone Scn, Ur NONE DETECTED NONE DETECTED    Comment: (NOTE) Tricyclics + metabolites, urine    Cutoff 1000 ng/mL Amphetamines + metabolites, urine  Cutoff 1000 ng/mL MDMA (  Ecstasy), urine              Cutoff 500 ng/mL Cocaine Metabolite, urine          Cutoff 300 ng/mL Opiate + metabolites, urine        Cutoff 300 ng/mL Phencyclidine (PCP), urine         Cutoff 25 ng/mL Cannabinoid, urine                 Cutoff 50 ng/mL Barbiturates + metabolites, urine  Cutoff 200 ng/mL Benzodiazepine, urine              Cutoff 200 ng/mL Methadone, urine                   Cutoff 300 ng/mL  The urine drug screen provides only a preliminary, unconfirmed analytical test result and should not be used for non-medical purposes. Clinical consideration and professional judgment should be applied to any positive drug screen result due to possible interfering substances. A more specific alternate chemical method must be used in order to obtain a confirmed analytical result. Gas chromatography / mass spectrometry (GC/MS) is  the preferred confirm atory method. Performed at [facility], [address] [place], [place] [address]   Lithium level     Status: Abnormal   Collection Time: 12/02/21 10:38 PM  Result Value Ref Range   Lithium Lvl 1.39 (H) 0.60 - 1.20 mmol/L    Comment: Performed at [facility], [address] [place], [place] [address]  TSH     Status: None   Collection Time: 12/02/21 10:38 PM  Result Value Ref Range   TSH 0.693 0.350 - 4.500 uIU/mL    Comment: Performed by a 3rd Generation assay with a functional sensitivity of <=0.01 uIU/mL. Performed at [facility], [address] [place], [place] [address]   CBC with Differential/Platelet     Status: None   Collection Time: 12/02/21 10:38 PM  Result Value Ref Range   WBC 9.6 4.0 - 10.5 K/uL   RBC 4.47 3.87 - 5.11 MIL/uL   Hemoglobin 13.9 12.0 - 15.0 g/dL   HCT 44.0 36.0 - 46.0 %   MCV 98.4 80.0 - 100.0 fL   MCH 31.1 26.0 - 34.0 pg   MCHC 31.6 30.0 - 36.0 g/dL   RDW 13.6 11.5 - 15.5 %   Platelets 332 150 - 400 K/uL   nRBC 0.0 0.0 - 0.2 %   Neutrophils Relative % 66 %   Neutro Abs 6.3 1.7 - 7.7 K/uL   Lymphocytes Relative 20 %   Lymphs Abs 1.9 0.7 - 4.0 K/uL   Monocytes Relative 10 %   Monocytes Absolute 1.0 0.1 - 1.0 K/uL   Eosinophils Relative 3 %   Eosinophils Absolute 0.3 0.0 - 0.5 K/uL   Basophils Relative 1 %   Basophils Absolute 0.1 0.0 - 0.1 K/uL   Immature Granulocytes 0 %   Abs Immature Granulocytes 0.03 0.00 - 0.07 K/uL    Comment: Performed at [facility], [address] [place], [place] [address]  Resp Panel by RT-PCR (Flu A&B, Covid) Nasopharyngeal Swab     Status: None   Collection Time: 12/03/21  1:40 AM   Specimen: Nasopharyngeal Swab; Nasopharyngeal(NP) swabs in vial transport medium  Result Value Ref Range   SARS Coronavirus 2 by RT PCR NEGATIVE NEGATIVE    Comment: (NOTE) SARS-CoV-2 target nucleic acids are NOT DETECTED.  The SARS-CoV-2 RNA is generally  detectable in  upper respiratory specimens during the acute phase of infection. The lowest concentration of SARS-CoV-2 viral copies this assay can detect is 138 copies/mL. A negative result does not preclude SARS-Cov-2 infection and should not be used as the sole basis for treatment or other patient management decisions. A negative result may occur with  improper specimen collection/handling, submission of specimen other than nasopharyngeal swab, presence of viral mutation(s) within the areas targeted by this assay, and inadequate number of viral copies(<138 copies/mL). A negative result must be combined with clinical observations, patient history, and epidemiological information. The expected result is Negative.  Fact Sheet for Patients:  EntrepreneurPulse.com.au  Fact Sheet for Healthcare Providers:  IncredibleEmployment.be  This test is no t yet approved or cleared by the [place] FDA and  has been authorized for detection and/or diagnosis of SARS-CoV-2 by FDA under an Emergency Use Authorization (EUA). This EUA will remain  in effect (meaning this test can be used) for the duration of the COVID-19 declaration under Section 564(b)(1) of the Act, 21 U.S.C.section 360bbb-3(b)(1), unless the authorization is terminated  or revoked sooner.       Influenza A by PCR NEGATIVE NEGATIVE   Influenza B by PCR NEGATIVE NEGATIVE    Comment: (NOTE) The Xpert Xpress SARS-CoV-2/FLU/RSV plus assay is intended as an aid in the diagnosis of influenza from Nasopharyngeal swab specimens and should not be used as a sole basis for treatment. Nasal washings and aspirates are unacceptable for Xpert Xpress SARS-CoV-2/FLU/RSV testing.  Fact Sheet for Patients: EntrepreneurPulse.com.au  Fact Sheet for Healthcare Providers: IncredibleEmployment.be  This test is not yet approved or cleared by the [place] FDA and has  been authorized for detection and/or diagnosis of SARS-CoV-2 by FDA under an Emergency Use Authorization (EUA). This EUA will remain in effect (meaning this test can be used) for the duration of the COVID-19 declaration under Section 564(b)(1) of the Act, 21 U.S.C. section 360bbb-3(b)(1), unless the authorization is terminated or revoked.  Performed at [facility], [place], [place] [address]   Troponin I (High Sensitivity)     Status: None   Collection Time: 12/03/21  2:33 AM  Result Value Ref Range   Troponin I (High Sensitivity) 5 <18 ng/L    Comment: (NOTE) Elevated high sensitivity troponin I (hsTnI) values and significant  changes across serial measurements may suggest ACS but many other  chronic and acute conditions are known to elevate hsTnI results.  Refer to the "Links" section for chest pain algorithms and additional  guidance. Performed at [facility], [address] [place], [place] [address]   Ammonia     Status: None   Collection Time: 12/03/21  2:33 AM  Result Value Ref Range   Ammonia 16 9 - 35 umol/L    Comment: Performed at [facility], [address] [place], [place] [address]    Blood Alcohol level:  Lab Results  Component Value Date   [facility] <10 76/19/5093    Metabolic Disorder Labs:  Lab Results  Component Value Date   HGBA1C 6.4 10/27/2021   No results found for: PROLACTIN Lab Results  Component Value Date   CHOL 186 10/19/2021   TRIG 140.0 10/19/2021   HDL 70.90 10/19/2021   CHOLHDL 3 10/19/2021   VLDL 28.0 10/19/2021   LDLCALC 87 10/19/2021   LDLCALC 99 09/22/2020    Current Medications: Current Facility-Administered Medications  Medication Dose Route Frequency Provider Last Rate Last Admin   acetaminophen (TYLENOL) 325 MG  tablet            acetaminophen (TYLENOL) tablet 650 mg  650 mg Oral Q6H PRN Deloria Lair, NP   650 mg at 12/03/21 0442   alum & mag hydroxide-simeth  (MAALOX/MYLANTA) 200-200-20 MG/5ML suspension 30 mL  30 mL Oral Q4H PRN Deloria Lair, NP       DULoxetine (CYMBALTA) DR capsule 60 mg  60 mg Oral Daily Doren Custard, Rashaun M, NP   60 mg at 12/03/21 1129   [START ON 12/04/2021] levothyroxine (SYNTHROID) tablet 50 mcg  50 mcg Oral QAC breakfast Deloria Lair, NP       magnesium hydroxide (MILK OF MAGNESIA) suspension 30 mL  30 mL Oral Daily PRN Deloria Lair, NP       metFORMIN (GLUCOPHAGE) tablet 1,000 mg  1,000 mg Oral BID WC Dixon, Rashaun M, NP   1,000 mg at 12/03/21 1017   propranolol (INDERAL) tablet 20 mg  20 mg Oral TID Deloria Lair, NP       QUEtiapine (SEROQUEL) tablet 50 mg  50 mg Oral QHS Parks Ranger, DO       risperiDONE (RISPERDAL) tablet 0.5 mg  0.5 mg Oral [id number] Parks Ranger, DO   0.5 mg at 12/03/21 1152   PTA Medications: Medications Prior to Admission  Medication Sig Dispense Refill Last Dose   albuterol (VENTOLIN HFA) 108 (90 Base) MCG/ACT inhaler Inhale 2 puffs into the lungs every 4 (four) hours as needed. For shortness of breath and wheezing 18 g 3    ALPRAZolam (XANAX) 0.5 MG tablet Take 30 min prior to procedure 10 tablet 0    amLODipine (NORVASC) 5 MG tablet TAKE 1 TABLET (5 MG TOTAL) BY MOUTH DAILY. 90 tablet 1    blood glucose meter kit and supplies Dispense based on patient and insurance preference. Use once a day.  DX Code: E11.9 1 each 0    cephALEXin (KEFLEX) 500 MG capsule Take 1 capsule (500 mg total) by mouth 2 (two) times daily. 14 capsule 0    clorazepate (TRANXENE) 3.75 MG tablet Take 1 tablet (3.75 mg total) by mouth 2 (two) times daily as needed for anxiety or sleep. 60 tablet 1    DULoxetine (CYMBALTA) 30 MG capsule Take 1 capsule (30 mg total) by mouth daily. For 2 weeks, then start 60 mg daily. 14 capsule 0    DULoxetine (CYMBALTA) 60 MG capsule TAKE 1 CAPSULE BY MOUTH EVERY DAY IN THE MORNING, after taking 30 mg for 2 weeks. 90 capsule 0    glucose blood test strip Use as  instructed once a day.  Dx Code: E11.9 30 each 2    hydrocortisone-pramoxine (ANALPRAM HC) 2.5-1 % rectal cream Place 1 application rectally 3 (three) times daily. 30 g 3    Lancets MISC Use as directed once a day.  Dx code: E11.9 30 each 2    levothyroxine (SYNTHROID) 50 MCG tablet TAKE 1 TABLET BY MOUTH DAILY BEFORE BREAKFAST 90 tablet 1    lithium carbonate 150 MG capsule TAKE 1 CAPSULE (150 MG TOTAL) BY MOUTH DAILY, TAKE WITH LITHIUM 600 MG TO EQUAL 750 MG TOTAL 90 capsule 0    lithium carbonate 300 MG capsule TAKE 2 CAPSULES (600 MG) BY MOUTH AT BEDTIME, WITH 150 MG CAPSULE 180 capsule 0    metFORMIN (GLUCOPHAGE) 1000 MG tablet Take 1 tablet (1,000 mg total) by mouth 2 (two) times daily with a meal. 180 tablet 1  ondansetron (ZOFRAN) 4 MG tablet Take 1 tablet (4 mg total) by mouth every 8 (eight) hours as needed for nausea or vomiting. 15 tablet 0    propranolol (INDERAL) 20 MG tablet Take 1 tablet (20 mg total) by mouth 3 (three) times daily. 90 tablet 0    QUEtiapine (SEROQUEL) 50 MG tablet Take 1 tablet (50 mg total) by mouth at bedtime. 30 tablet 2    traZODone (DESYREL) 50 MG tablet TAKE 1/2 TO 2 TABLETS BY MOUTH EVERY DAY AT BEDTIME AS NEEDED FOR SLEEP 180 tablet 0     Musculoskeletal: Strength & Muscle Tone: within normal limits Gait & Station:  Unknown Patient leans: N/A            Psychiatric Specialty Exam:  Presentation  General Appearance: Bizarre; Disheveled  Eye Contact:Minimal  Speech:Pressured  Speech Volume:Increased  Handedness:Right   Mood and Affect  Mood:Angry; Depressed; Dysphoric; Hopeless  Affect:Depressed; Tearful; Full Range   Thought Process  Thought Processes:Disorganized; Irrevelant  Duration of Psychotic Symptoms: N/A  Past Diagnosis of Schizophrenia or Psychoactive disorder: No  Descriptions of Associations:Loose  Orientation:Partial  Thought Content:Delusions; Illogical; Scattered; Paranoid  Ideation  Hallucinations:Hallucinations: Auditory; Visual  Ideas of Reference:Delusions; Paranoia  Suicidal Thoughts:Suicidal Thoughts: Yes, Passive SI Passive Intent and/or Plan: Without Intent  Homicidal Thoughts:Homicidal Thoughts: No   Sensorium  Memory:Remote Poor  Judgment:Impaired  Insight:None   Executive Functions  Concentration:Poor  Attention Span:Poor  Recall:Poor  Fund of Knowledge:Poor  Language:Poor   Psychomotor Activity  Psychomotor Activity:Psychomotor Activity: Decreased   Assets  Assets:Financial Resources/Insurance; Housing; Intimacy; Social Support   Sleep  Sleep:Sleep: Poor    Physical Exam: Physical Exam Constitutional:      Appearance: Normal appearance.  HENT:     Head: Normocephalic and atraumatic.     Mouth/Throat:     Pharynx: Oropharynx is clear.  Eyes:     Pupils: Pupils are equal, round, and reactive to light.  Cardiovascular:     Rate and Rhythm: Normal rate and regular rhythm.  Pulmonary:     Effort: Pulmonary effort is normal.     Breath sounds: Normal breath sounds.  Abdominal:     General: Abdomen is flat.     Palpations: Abdomen is soft.  Musculoskeletal:        General: Normal range of motion.  Skin:    General: Skin is warm and dry.  Neurological:     General: No focal deficit present.     Mental Status: She is alert. Mental status is at baseline.  Psychiatric:        Attention and Perception: Attention and perception normal.        Mood and Affect: Mood is anxious and depressed. Affect is flat and angry.        Speech: Speech normal.        Behavior: Behavior is agitated. Behavior is cooperative.        Thought Content: Thought content normal.        Cognition and Memory: Cognition normal. She exhibits impaired recent memory.        Judgment: Judgment normal.   Review of Systems  Constitutional: Negative.   HENT: Negative.    Eyes: Negative.   Respiratory: Negative.    Cardiovascular:  Negative.   Gastrointestinal: Negative.   Genitourinary: Negative.   Musculoskeletal: Negative.   Skin: Negative.   Neurological: Negative.   Endo/Heme/Allergies: Negative.   Psychiatric/Behavioral:  Positive for depression. The patient has insomnia.   Blood pressure Marland Kitchen)  116/98, pulse 87, temperature 98.1 F (36.7 C), temperature source Oral, resp. rate 18, height '5\' 2"'  (1.575 m), weight 86.2 kg, SpO2 99 %. Body mass index is 34.75 kg/m.  Treatment Plan Summary: Daily contact with patient to assess and evaluate symptoms and progress in treatment, Medication management, and Plan discontinue lithium.  Start low-dose Risperdal and Seroquel at nighttime for sleep.  Continue Cymbalta.  Observation Level/Precautions:  15 minute checks  Laboratory:  CBC Chemistry Profile HbAIC UA  Psychotherapy:    Medications:    Consultations:    Discharge Concerns:    Estimated LOS:  Other:     Physician Treatment Plan for Primary Diagnosis: Altered mental status Long Term Goal(s): Improvement in symptoms so as ready for discharge  Short Term Goals: Ability to identify changes in lifestyle to reduce recurrence of condition will improve, Ability to verbalize feelings will improve, Ability to disclose and discuss suicidal ideas, Ability to demonstrate self-control will improve, Ability to identify and develop effective coping behaviors will improve, Ability to maintain clinical measurements within normal limits will improve, Compliance with prescribed medications will improve, and Ability to identify triggers associated with substance abuse/mental health issues will improve  Physician Treatment Plan for Secondary Diagnosis: Principal Problem:   Altered mental status    I certify that inpatient services furnished can reasonably be expected to improve the patient's condition.    Parks Ranger, DO 1/22/202312:06 PM

## 2021-12-03 NOTE — ED Provider Notes (Signed)
[facility] Provider Note    Event Date/Time   First MD Initiated Contact with Patient 12/02/21 2254     (approximate)   History   Altered Mental Status   HPI  JEANNIA TATRO is a 76 y.o. female with a history of dementia, bipolar disorder, anxiety, fibromyalgia, COPD, diabetes who presents with her husband and daughter for concerns of altered mental status and hallucinations.  Patient is extremely confused and unable to provide history which is therefore gathered from her family.  This is patient's third visit to the ER in the last week for the same complaints.  It seems like over the last week patient has been having episodes of confusion, agitation, hallucination, paranoia.  On top of the ER visits they also saw her primary care doctor and her psychiatrist and nothing has worked so far.  The family has not slept in 3 nights because the patient gets very agitated at night.  The patient tells me that her daughter who is in the room is actually her sister and that she is having an affair with her husband.  Patient reports that she does not want to be hospitalized because she needs to find a new home.  She denies any pain at this time.     Past Medical History:  Diagnosis Date   Acute pain of right shoulder 09/24/2018   Acute upper respiratory infection 09/19/2010   Allergic rhinitis 10/03/2016   Anterolisthesis of lumbar spine    Arthritis    hands   Bilateral hip pain 09/02/2014   Bipolar II disorder    Cellulitis and abscess of trunk 09/15/2009   Ceruminosis 03/04/2014   Cervicalgia 06/20/2007   Chronic low back pain 06/20/2007   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-sided sciatica 08/06/2012   COPD (chronic obstructive pulmonary disease) (HCC)    Deformity of finger 07/15/2014   Degeneration of lumbar intervertebral disc 08/22/2020   Degenerative lumbar spinal stenosis    Dermatitis 15/40/0867   Diastolic dysfunction    Per pt, diagnosed  after [facility]   Diverticulosis    DOE (dyspnea on exertion) 07/20/2016   Essential (primary) hypertension 04/23/2007   Estrogen deficiency 09/01/2017   External hemorrhoid 08/01/2015   Fibromyalgia    Foraminal stenosis of lumbar region 08/22/2020   Generalized anxiety disorder 09/01/2018   Hyperlipidemia    Inflammatory disease of uterus 04/23/2007   Left flank pain 07/13/2021   Left upper quadrant pain 09/01/2017   Lower abdominal pain 08/06/2012   Lumbar back pain with radiculopathy affecting left lower extremity 01/24/2010   Lumbar radiculopathy 01/26/2021   Lump in upper inner quadrant of left breast 07/13/2021   Major depressive disorder    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   Otitis media 09/05/2010   Palpitations 07/20/2016   Peripheral neuropathy 02/22/2010   Retrolisthesis of vertebrae 08/22/2020   Shingles outbreak 04/14/2015   Sinusitis, acute maxillary 09/14/2013   Stress incontinence, female 08/01/2015   Thoracic aortic atherosclerosis 05/14/2017   Tinea corporis 09/01/2017   Tinnitus 03/04/2014   Torticollis 03/04/2014   Type II diabetes mellitus 06/05/2018    Past Surgical History:  Procedure Laterality Date   APPENDECTOMY     EYE SURGERY     KNEE ARTHROSCOPY Right 11/12/2006   TONSILLECTOMY     TOTAL ABDOMINAL HYSTERECTOMY       Physical Exam   Triage Vital Signs: ED Triage Vitals  Enc Vitals Group     BP  12/02/21 1950 123/75     Pulse Rate 12/02/21 1950 75     Resp 12/02/21 1950 19     Temp 12/02/21 1950 98 F (36.7 C)     Temp Source 12/02/21 1950 Oral     SpO2 12/02/21 1949 94 %     Weight --      Height --      Head Circumference --      Peak Flow --      Pain Score 12/02/21 1950 0     Pain Loc --      Pain Edu? --      Excl. in [place]? --     Most recent vital signs: Vitals:   12/02/21 2220 12/03/21 0311  BP: 135/77 132/86  Pulse: 69 88  Resp: 18 20  Temp:  98.5 F (36.9 C)  SpO2: 100% 100%      Constitutional: Alert and oriented to self and place, no apparent distress. HEENT:      Head: Normocephalic and atraumatic.         Eyes: Conjunctivae are normal. Sclera is non-icteric.       Mouth/Throat: Mucous membranes are moist.       Neck: Supple with no signs of meningismus. Cardiovascular: Regular rate and rhythm. No murmurs, gallops, or rubs. 2+ symmetrical distal pulses are present in all extremities.  Respiratory: Normal respiratory effort. Lungs are clear to auscultation bilaterally.  Gastrointestinal: Soft, non tender, and non distended with positive bowel sounds. No rebound or guarding. Genitourinary: No CVA tenderness. Musculoskeletal:  No edema, cyanosis, or erythema of extremities. Neurologic: Normal speech and language. Face is symmetric. Moving all extremities. No gross focal neurologic deficits are appreciated. Skin: Skin is warm, dry and intact. No rash noted. Psychiatric: Agitated with flight of ideas, paranoia and hallucination  ED Results / Procedures / Treatments   Labs (all labs ordered are listed, but only abnormal results are displayed) Labs Reviewed  COMPREHENSIVE METABOLIC PANEL - Abnormal; Notable for the following components:      Result Value   CO2 21 (*)    Glucose, Bld 128 (*)    All other components within normal limits  CBC - Abnormal; Notable for the following components:   MCV 100.2 (*)    All other components within normal limits  URINALYSIS, ROUTINE W REFLEX MICROSCOPIC - Abnormal; Notable for the following components:   Color, Urine YELLOW (*)    APPearance HAZY (*)    Ketones, ur 80 (*)    Leukocytes,Ua LARGE (*)    All other components within normal limits  ACETAMINOPHEN LEVEL - Abnormal; Notable for the following components:   Acetaminophen (Tylenol), Serum <10 (*)    All other components within normal limits  SALICYLATE LEVEL - Abnormal; Notable for the following components:   Salicylate Lvl <6.9 (*)    All other components  within normal limits  URINE DRUG SCREEN, QUALITATIVE ([facility] ONLY) - Abnormal; Notable for the following components:   Benzodiazepine, Ur Scrn POSITIVE (*)    All other components within normal limits  BLOOD GAS, VENOUS - Abnormal; Notable for the following components:   Acid-base deficit 3.3 (*)    All other components within normal limits  LITHIUM LEVEL - Abnormal; Notable for the following components:   Lithium Lvl 1.39 (*)    All other components within normal limits  RESP PANEL BY RT-PCR (FLU A&B, COVID) ARPGX2  ETHANOL  LACTIC ACID, PLASMA  TSH  CBC WITH DIFFERENTIAL/PLATELET  AMMONIA  CBG MONITORING, ED  TROPONIN I (HIGH SENSITIVITY)  TROPONIN I (HIGH SENSITIVITY)     EKG  none   RADIOLOGY none    PROCEDURES:  Critical Care performed: No  Procedures    IMPRESSION / MDM / ASSESSMENT AND PLAN / ED COURSE  I reviewed the triage vital signs and the nursing notes.  76 y.o. female with a history of dementia, bipolar disorder, anxiety, fibromyalgia, COPD, diabetes who presents with her husband and daughter for concerns of altered mental status and hallucinations.  Over the last week patient has been declining with episodes that sound like sundowning since her agitation and paranoia seem to come in the evenings.  Has seen her primary care doctor, her psychiatrist, and this is her third visit to the ER.  She has had had an extensive work-up including CTs of the head and abdomen, chest x-ray, urinalysis, blood work, lithium levels with no real etiology at this time.  Ddx: My main concern is that this is a psychiatric issue versus dementia with behavioral disturbances but will rule out any other etiologies such as infection, dehydration, AKI, electrolyte derangements as possible contributors.   Plan: Ammonia level, COVID and flu, urinalysis, alcohol level, UDS, lithium level, thyroid studies, CBC, chemistry panel, VBG.  Patient extremely agitated once family try to leave  therefore we will give a dose of IM Zyprexa.  Psychiatry has been consulted   MEDICATIONS GIVEN IN ED: Medications  OLANZapine (ZYPREXA) injection 5 mg (5 mg Intramuscular Given 12/03/21 0037)     ED COURSE: Ammonia level normal, VBG with no signs of hypercapnia, UDS positive for benzos.  Chemistry panel with mild hyperglycemia with no evidence of DKA and no significant electrolyte derangements.  No signs of sepsis.  Thyroid studies normal.  Lithium level slightly elevated at 1.39.  Level was checked a week ago and that was normal.  We will just hold the lithium at this time specially since patient has a normal kidney function.  I extensively review the chart with to the prior visits to the ER where a head CT was done and it was unremarkable.  Initially she had some abdominal pain and that was also negative.  She had a dirty UA with a negative urine culture therefore low suspicion for UTI.  Her COVID and flu here are also negative.  After Zyprexa patient calmed down and was able to rest comfortably.  That was administered after she was seen by psychiatry who recommended admission to the [facility] psych unit.  Family was very thankful and that was exactly the outcome they were hoping for.   Consults: Psychiatry   EMR reviewed to prior ED visits and visit with her PCP from a week ago for anxiety, diabetes, hypertension, and neurocognitive disorder       FINAL CLINICAL IMPRESSION(S) / ED DIAGNOSES   Final diagnoses:  Severe dementia with psychotic disturbance, unspecified dementia type     Rx / DC Orders   ED Discharge Orders     None        Note:  This document was prepared using Dragon voice recognition software and may include unintentional dictation errors.   Alfred Levins, [place], MD 12/03/21 [phone]

## 2021-12-03 NOTE — BH Assessment (Signed)
Pt currently under review by [facility]. Charge nurse Oleta Mouse requested updated covid results.

## 2021-12-03 NOTE — Progress Notes (Signed)
Patient was escorted onto the unit at 0340. Pt denies SI and HI at this time. Pt reported that she is here because "they said I was having hallucinations." Pt is A&Ox4. Pt is a fall risk and uses a front wheel walker. Pt is a moderate assist with walking as she is unsteady on her feet. Pt states that she does not know when her last BM was and she has trouble urinating. Pt has a UTI and started antibiotics 12/01/2021. Pt c/o anxiety about belongings and family members that have passed away. Pt c/o headache rated 9/10. Pt has a bruise on her right forearm. Pt is diabetic and is on a carb modified diet and metformin. Pt was oriented to the unit by staff and given the call bell to use for assistance. Pt remains safe on the unit at this time.

## 2021-12-03 NOTE — Plan of Care (Signed)
Patient presents Agitated, Verbally Aggressive and Paranoid. Pt's hands are shaking and extremely restful throughout extremities. Patient denies needs for hospitalization.  Pt. Denies SI/HI and states "I don't need to be at this Hospital"  "My sister and her husband are having an affair and they brought me here" .  Pt presents confused and has trouble completing statements.  Pt did verbalize she has some forgetfulness and doesn't know why she is here.  Pt needs verbal redirection and minimal assistance with ADLs.  Pt did comply with scheduled meds thus far during shift with encouragement.  Pt encourage to participate in meals and therapeutic milieu. Q 15 minute safety checks in place.  Will continue plan of care.  Problem: Education: Goal: Knowledge of [facility] Education information/materials will improve Outcome: Not Progressing Goal: Emotional status will improve Outcome: Not Progressing Goal: Mental status will improve Outcome: Not Progressing Goal: Verbalization of understanding the information provided will improve Outcome: Not Progressing   Problem: Activity: Goal: Interest or engagement in activities will improve Outcome: Not Progressing  Q 15 minute safety checks in place.  Plan of care will continue.

## 2021-12-03 NOTE — Consult Note (Signed)
[facility] Face-to-Face Psychiatry Consult   Reason for Consult:  psych evaluation Referring Physician:  Dr. Alfred Levins Patient Identification: Megan Duncan MRN:  [id number] Principal Diagnosis: Bipolar 2 disorder [facility]) Diagnosis:  Principal Problem:   Bipolar 2 disorder   Total Time spent with patient: 45 minutes  Subjective:  Per triage nurse, Megan Duncan is a 76 y.o. female patient admitted with husband and daughter- states that 1 week ago pt started having panic attacks, tremors, anxiety, and s/s of worsening dementia. Pt is alert, oriented to self only, states "I don't want to go to jail." Pt is taking antibiotics since yesterday for UTI. Daughter states pt has been more confrontational, insomnia, agitation, and will take medication but will not eat very much. Pt has been drinking water, urinary frequency has improved per daughter. Pt able to move all four extremities. Marland Kitchen  HPI:  Megan Duncan, 76 y.o., female patient seen by TTS and this provider; chart reviewed and consulted with Dr. Dwyane Dee on 12/03/21.  On evaluation Megan Duncan reports per TTS, pt's daughter reporting that pt is a danger to herself and others. Daughter reports mother has been very confused, agitated, and combative toward family members. She further reports that pt has been seen in the ED several times over the past week. She reports her mother's mental state has been progressively declining and they are concerned for her safety. Pt's daughter requested that pt receive a psychiatric evaluation.   Per chart review, pt had been endorsing auditory and visual hallucinations when she presented to the ED yesterday. Daughter is unsure if pt has been compliant with her current psychotropic medications. Pt also prescribed abx for recently diagnosed UTI.  During evaluation Megan Duncan is sitting on the bed. she is alert/oriented to self; anxious and confused; and mood congruent with affect.  Patient is speaking in  a loud volume, and fast pace; with fair eye contact.  Her thought process is incoherent and irrelevant; There is indication that she is currently r experiencing delusional thought content.  Patient denies suicidal/self-harm/homicidal ideation, psychosis, but is thought to be experiencing  paranoia.  Patient's daughter stares that her mothers mental status has severely decline in the last week and that the patient hasnt been sleeping. Her husband reports that patient has become violent and has thrown things. He says she no longer wants to bath or do hair or nails.     Recommendations: Inpatient [facility] psychiatric hospitalizatio    Past Psychiatric History: bipolar 1 disorder  Risk to Self:   Risk to Others:   Prior Inpatient Therapy:   Prior Outpatient Therapy:    Past Medical History:  Past Medical History:  Diagnosis Date   Acute pain of right shoulder 09/24/2018   Acute upper respiratory infection 09/19/2010   Allergic rhinitis 10/03/2016   Anterolisthesis of lumbar spine    Arthritis    hands   Bilateral hip pain 09/02/2014   Bipolar II disorder    Cellulitis and abscess of trunk 09/15/2009   Ceruminosis 03/04/2014   Cervicalgia 06/20/2007   Chronic low back pain 06/20/2007   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-sided sciatica 08/06/2012   COPD (chronic obstructive pulmonary disease) ([place])    Deformity of finger 07/15/2014   Degeneration of lumbar intervertebral disc 08/22/2020   Degenerative lumbar spinal stenosis    Dermatitis 97/35/3299   Diastolic dysfunction    Per pt, diagnosed after [facility]   Diverticulosis    DOE (dyspnea on exertion) 07/20/2016  Essential (primary) hypertension 04/23/2007   Estrogen deficiency 09/01/2017   External hemorrhoid 08/01/2015   Fibromyalgia    Foraminal stenosis of lumbar region 08/22/2020   Generalized anxiety disorder 09/01/2018   Hyperlipidemia    Inflammatory disease of uterus 04/23/2007   Left flank pain 07/13/2021   Left  upper quadrant pain 09/01/2017   Lower abdominal pain 08/06/2012   Lumbar back pain with radiculopathy affecting left lower extremity 01/24/2010   Lumbar radiculopathy 01/26/2021   Lump in upper inner quadrant of left breast 07/13/2021   Major depressive disorder    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   Otitis media 09/05/2010   Palpitations 07/20/2016   Peripheral neuropathy 02/22/2010   Retrolisthesis of vertebrae 08/22/2020   Shingles outbreak 04/14/2015   Sinusitis, acute maxillary 09/14/2013   Stress incontinence, female 08/01/2015   Thoracic aortic atherosclerosis 05/14/2017   Tinea corporis 09/01/2017   Tinnitus 03/04/2014   Torticollis 03/04/2014   Type II diabetes mellitus 06/05/2018    Past Surgical History:  Procedure Laterality Date   APPENDECTOMY     EYE SURGERY     KNEE ARTHROSCOPY Right 11/12/2006   TONSILLECTOMY     TOTAL ABDOMINAL HYSTERECTOMY     Family History:  Family History  Problem Relation Age of Onset   Hypertension Mother    Hiatal hernia Mother    Diabetes Father    Hypertension Father    Heart attack Father    Alcoholism Father    Bipolar disorder Father    Mental illness Sister    Suicidality Sister    Colon cancer Maternal Aunt 65   Breast cancer Maternal Aunt    Bipolar disorder Paternal Aunt    Family Psychiatric  History: unknown Social History:  Social History   Substance and Sexual Activity  Alcohol Use Yes   Alcohol/week: 0.0 - 1.0 standard drinks   Comment: occasional wine     Social History   Substance and Sexual Activity  Drug Use No    Social History   Socioeconomic History   Marital status: Married    Spouse name: Zeah Germano   Number of children: 3   Years of education: 14   Highest education level: Some college, no degree  Occupational History   Occupation: retired  Tobacco Use   Smoking status: Former    Packs/day: 1.50    Years: 40.00    Pack years: 60.00    Types:  Cigarettes    Quit date: 07/02/2004    Years since quitting: 17.4    Passive exposure: Past   Smokeless tobacco: Never   Tobacco comments:    Verified by Aldona Lento (Husband), & Lauralee Evener (Daughter)  Vaping Use   Vaping Use: Never used  Substance and Sexual Activity   Alcohol use: Yes    Alcohol/week: 0.0 - 1.0 standard drinks    Comment: occasional wine   Drug use: No   Sexual activity: Never  Other Topics Concern   Not on file  Social History Narrative   Lives with husband and grandson she is raising.   11/07/21 Lives with husband   Social Determinants of Health   Financial Resource Strain: Medium Risk   Difficulty of Paying Living Expenses: Somewhat hard  Food Insecurity: No Food Insecurity   Worried About Charity fundraiser in the Last Year: Never true   Ran Out of Food in the Last Year: Never true  Transportation Needs: No Transportation Needs   Lack of Transportation (  Medical): No   Lack of Transportation (Non-Medical): No  Physical Activity: Inactive   Days of Exercise per Week: 0 days   Minutes of Exercise per Session: 0 min  Stress: No Stress Concern Present   Feeling of Stress : Not at all  Social Connections: Socially Integrated   Frequency of Communication with Friends and Family: More than three times a week   Frequency of Social Gatherings with Friends and Family: More than three times a week   Attends Religious Services: 1 to 4 times per year   Active Member of [employer] or Organizations: Yes   Attends Music therapist: More than 4 times per year   Marital Status: Married   Additional Social History:    Allergies:   Allergies  Allergen Reactions   Atorvastatin Other (See Comments)    Significant rise in liver tests   Codeine Nausea And Vomiting   Diclofenac Other (See Comments)    Elevated LFT's   Doxycycline     Extreme nausea   Sulfonamide Derivatives Swelling    Labs:  Results for orders placed or performed during the  hospital encounter of 12/02/21 (from the past 48 hour(s))  Comprehensive metabolic panel     Status: Abnormal   Collection Time: 12/02/21  7:57 PM  Result Value Ref Range   Sodium 138 135 - 145 mmol/L   Potassium 3.9 3.5 - 5.1 mmol/L   Chloride 106 98 - 111 mmol/L   CO2 21 (L) 22 - 32 mmol/L   Glucose, Bld 128 (H) 70 - 99 mg/dL    Comment: Glucose reference range applies only to samples taken after fasting for at least 8 hours.   BUN 12 8 - 23 mg/dL   Creatinine, Ser 0.81 0.44 - 1.00 mg/dL   Calcium 9.7 8.9 - 10.3 mg/dL   Total Protein 7.3 6.5 - 8.1 g/dL   Albumin 4.3 3.5 - 5.0 g/dL   AST 24 15 - 41 U/L   ALT 20 0 - 44 U/L   Alkaline Phosphatase 71 38 - 126 U/L   Total Bilirubin 0.9 0.3 - 1.2 mg/dL   GFR, Estimated >60 >60 mL/min    Comment: (NOTE) Calculated using the CKD-EPI Creatinine Equation (2021)    Anion gap 11 5 - 15    Comment: Performed at [facility], [address] [place], [place] [address]  CBC     Status: Abnormal   Collection Time: 12/02/21  7:57 PM  Result Value Ref Range   WBC 10.3 4.0 - 10.5 K/uL   RBC 4.57 3.87 - 5.11 MIL/uL   Hemoglobin 14.3 12.0 - 15.0 g/dL   HCT 45.8 36.0 - 46.0 %   MCV 100.2 (H) 80.0 - 100.0 fL   MCH 31.3 26.0 - 34.0 pg   MCHC 31.2 30.0 - 36.0 g/dL   RDW 13.7 11.5 - 15.5 %   Platelets 357 150 - 400 K/uL   nRBC 0.0 0.0 - 0.2 %    Comment: Performed at [facility], [address] [place], [place] [address]  Blood gas, venous     Status: Abnormal   Collection Time: 12/02/21 10:20 PM  Result Value Ref Range   pH, Ven 7.28 7.250 - 7.430   pCO2, Ven 51 44.0 - 60.0 mmHg   pO2, Ven 33.0 32.0 - 45.0 mmHg   Bicarbonate 24.0 20.0 - 28.0 mmol/L   Acid-base deficit 3.3 (H) 0.0 - 2.0 mmol/L   O2 Saturation 54.4 %  Patient temperature 37.0    Collection site VEIN    Sample type VENOUS     Comment: Performed at [facility], [address] [place], [place] [address]  Acetaminophen level     Status:  Abnormal   Collection Time: 12/02/21 10:38 PM  Result Value Ref Range   Acetaminophen (Tylenol), Serum <10 (L) 10 - 30 ug/mL    Comment: (NOTE) Therapeutic concentrations vary significantly. A range of 10-30 ug/mL  may be an effective concentration for many patients. However, some  are best treated at concentrations outside of this range. Acetaminophen concentrations >150 ug/mL at 4 hours after ingestion  and >50 ug/mL at 12 hours after ingestion are often associated with  toxic reactions.  Performed at [facility] [address] [place], [place] [address]   Ethanol     Status: None   Collection Time: 12/02/21 10:38 PM  Result Value Ref Range   Alcohol, Ethyl (B) <10 <10 mg/dL    Comment: (NOTE) Lowest detectable limit for serum alcohol is 10 mg/dL.  For medical purposes only. Performed at [facility], [address] [place], [place] [address]   Lactic acid, plasma     Status: None   Collection Time: 12/02/21 10:38 PM  Result Value Ref Range   Lactic Acid, Venous 1.2 0.5 - 1.9 mmol/L    Comment: Performed at [facility], [address] [place], [place] [address]  Salicylate level     Status: Abnormal   Collection Time: 12/02/21 10:38 PM  Result Value Ref Range   Salicylate Lvl <3.6 (L) 7.0 - 30.0 mg/dL    Comment: Performed at [facility], [place], [place] [address]  Troponin I (High Sensitivity)     Status: None   Collection Time: 12/02/21 10:38 PM  Result Value Ref Range   Troponin I (High Sensitivity) 6 <18 ng/L    Comment: (NOTE) Elevated high sensitivity troponin I (hsTnI) values and significant  changes across serial measurements may suggest ACS but many other  chronic and acute conditions are known to elevate hsTnI results.  Refer to the "Links" section for chest pain algorithms and additional  guidance. Performed at [facility], [address] [place], [place] [address]   Lithium level      Status: Abnormal   Collection Time: 12/02/21 10:38 PM  Result Value Ref Range   Lithium Lvl 1.39 (H) 0.60 - 1.20 mmol/L    Comment: Performed at [facility], [address] [place], [place] [address]  TSH     Status: None   Collection Time: 12/02/21 10:38 PM  Result Value Ref Range   TSH 0.693 0.350 - 4.500 uIU/mL    Comment: Performed by a 3rd Generation assay with a functional sensitivity of <=0.01 uIU/mL. Performed at [facility], [address] [place], [place] [address]   CBC with Differential/Platelet     Status: None   Collection Time: 12/02/21 10:38 PM  Result Value Ref Range   WBC 9.6 4.0 - 10.5 K/uL   RBC 4.47 3.87 - 5.11 MIL/uL   Hemoglobin 13.9 12.0 - 15.0 g/dL   HCT 44.0 36.0 - 46.0 %   MCV 98.4 80.0 - 100.0 fL   MCH 31.1 26.0 - 34.0 pg   MCHC 31.6 30.0 - 36.0 g/dL   RDW 13.6 11.5 - 15.5 %   Platelets 332 150 - 400 K/uL   nRBC 0.0 0.0 - 0.2 %   Neutrophils  Relative % 66 %   Neutro Abs 6.3 1.7 - 7.7 K/uL   Lymphocytes Relative 20 %   Lymphs Abs 1.9 0.7 - 4.0 K/uL   Monocytes Relative 10 %   Monocytes Absolute 1.0 0.1 - 1.0 K/uL   Eosinophils Relative 3 %   Eosinophils Absolute 0.3 0.0 - 0.5 K/uL   Basophils Relative 1 %   Basophils Absolute 0.1 0.0 - 0.1 K/uL   Immature Granulocytes 0 %   Abs Immature Granulocytes 0.03 0.00 - 0.07 K/uL    Comment: Performed at [facility], [address] [place], [place] [address]    No current facility-administered medications for this encounter.   Current Outpatient Medications  Medication Sig Dispense Refill   albuterol (VENTOLIN HFA) 108 (90 Base) MCG/ACT inhaler Inhale 2 puffs into the lungs every 4 (four) hours as needed. For shortness of breath and wheezing 18 g 3   ALPRAZolam (XANAX) 0.5 MG tablet Take 30 min prior to procedure 10 tablet 0   amLODipine (NORVASC) 5 MG tablet TAKE 1 TABLET (5 MG TOTAL) BY MOUTH DAILY. 90 tablet 1   blood glucose meter kit and supplies Dispense  based on patient and insurance preference. Use once a day.  DX Code: E11.9 1 each 0   cephALEXin (KEFLEX) 500 MG capsule Take 1 capsule (500 mg total) by mouth 2 (two) times daily. 14 capsule 0   clorazepate (TRANXENE) 3.75 MG tablet Take 1 tablet (3.75 mg total) by mouth 2 (two) times daily as needed for anxiety or sleep. 60 tablet 1   DULoxetine (CYMBALTA) 30 MG capsule Take 1 capsule (30 mg total) by mouth daily. For 2 weeks, then start 60 mg daily. 14 capsule 0   DULoxetine (CYMBALTA) 60 MG capsule TAKE 1 CAPSULE BY MOUTH EVERY DAY IN THE MORNING, after taking 30 mg for 2 weeks. 90 capsule 0   glucose blood test strip Use as instructed once a day.  Dx Code: E11.9 30 each 2   hydrocortisone-pramoxine (ANALPRAM HC) 2.5-1 % rectal cream Place 1 application rectally 3 (three) times daily. 30 g 3   Lancets MISC Use as directed once a day.  Dx code: E11.9 30 each 2   levothyroxine (SYNTHROID) 50 MCG tablet TAKE 1 TABLET BY MOUTH DAILY BEFORE BREAKFAST 90 tablet 1   lithium carbonate 150 MG capsule TAKE 1 CAPSULE (150 MG TOTAL) BY MOUTH DAILY, TAKE WITH LITHIUM 600 MG TO EQUAL 750 MG TOTAL 90 capsule 0   lithium carbonate 300 MG capsule TAKE 2 CAPSULES (600 MG) BY MOUTH AT BEDTIME, WITH 150 MG CAPSULE 180 capsule 0   metFORMIN (GLUCOPHAGE) 1000 MG tablet Take 1 tablet (1,000 mg total) by mouth 2 (two) times daily with a meal. 180 tablet 1   ondansetron (ZOFRAN) 4 MG tablet Take 1 tablet (4 mg total) by mouth every 8 (eight) hours as needed for nausea or vomiting. 15 tablet 0   propranolol (INDERAL) 20 MG tablet Take 1 tablet (20 mg total) by mouth 3 (three) times daily. 90 tablet 0   QUEtiapine (SEROQUEL) 50 MG tablet Take 1 tablet (50 mg total) by mouth at bedtime. 30 tablet 2   traZODone (DESYREL) 50 MG tablet TAKE 1/2 TO 2 TABLETS BY MOUTH EVERY DAY AT BEDTIME AS NEEDED FOR SLEEP 180 tablet 0    Musculoskeletal: Strength & Muscle Tone: decreased and atrophy Gait & Station: unable to  stand Patient leans: N/A  Psychiatric Specialty Exam:  Presentation  General Appearance: Bizarre; Disheveled  Eye Contact:Minimal  Speech:Pressured  Speech Volume:Increased  Handedness:Right   Mood and Affect  Mood:Angry; Depressed; Dysphoric; Hopeless  Affect:Depressed; Tearful; Full Range   Thought Process  Thought Processes:Disorganized; Irrevelant  Descriptions of Associations:Loose  Orientation:Partial  Thought Content:Delusions; Illogical; Scattered; Paranoid Ideation  History of Schizophrenia/Schizoaffective disorder:No  Duration of Psychotic Symptoms:N/A  Hallucinations:Hallucinations: Auditory; Visual  Ideas of Reference:Delusions; Paranoia  Suicidal Thoughts:Suicidal Thoughts: Yes, Passive SI Passive Intent and/or Plan: Without Intent  Homicidal Thoughts:Homicidal Thoughts: No   Sensorium  Memory:Remote Poor  Judgment:Impaired  Insight:None   Executive Functions  Concentration:Poor  Attention Span:Poor  Recall:Poor  Fund of Knowledge:Poor  Language:Poor   Psychomotor Activity  Psychomotor Activity:Psychomotor Activity: Decreased   Assets  Assets:Financial Resources/Insurance; Housing; Intimacy; Social Support   Sleep  Sleep:Sleep: Poor   Physical Exam: Physical Exam Vitals and nursing note reviewed.  HENT:     Head: Normocephalic and atraumatic.     Nose: Nose normal.     Mouth/Throat:     Mouth: Mucous membranes are moist.  Eyes:     Pupils: Pupils are equal, round, and reactive to light.  Pulmonary:     Effort: Pulmonary effort is normal.  Musculoskeletal:     Cervical back: Normal range of motion.  Skin:    General: Skin is warm and dry.  Neurological:     Mental Status: She is alert.  Psychiatric:        Mood and Affect: Mood is anxious and depressed. Affect is labile, tearful and inappropriate.        Speech: Speech is rapid and pressured and tangential.        Behavior: Behavior is aggressive and  hyperactive.        Thought Content: Thought content is paranoid and delusional.        Cognition and Memory: Cognition is impaired. Memory is impaired.        Judgment: Judgment is impulsive and inappropriate.   Review of Systems  Psychiatric/Behavioral:  Positive for depression, hallucinations and memory loss. The patient is nervous/anxious and has insomnia.   All other systems reviewed and are negative. Blood pressure 135/77, pulse 69, temperature 98 F (36.7 C), temperature source Oral, resp. rate 18, SpO2 100 %. There is no height or weight on file to calculate BMI.  Treatment Plan Summary: Daily contact with patient to assess and evaluate symptoms and progress in treatment, Medication management, and Admit to gero psych  Disposition: Recommend psychiatric Inpatient admission when medically cleared. Supportive therapy provided about ongoing stressors. Discussed crisis plan, support from social network, calling 911, coming to the Emergency Department, and calling Suicide Hotline.  Deloria Lair, NP 12/03/2021 1:35 AM

## 2021-12-03 NOTE — Progress Notes (Signed)
Patient presents disorganized and agitated.  Pt continues to have poor judgement walking up the hall with blanket under walker with unsteady gate.  Pt needs constant redirection.  Patient becomes irritable with staff. "Yelling I don't have to listen to you get me out of here".  Patient beats her fist on the table and threatens staff "I will spit this water on you, I will rip your mask off your face".   Pt presents Delusional "I know my family told you to do this to me">  Staff continue to monitor pt 1:1 as ordered per provider.  Nursing Staff continue to offer emotional support and encouragment.

## 2021-12-03 NOTE — [facility] Suicide Risk Assessment (Signed)
[facility] Admission Suicide Risk Assessment   Nursing information obtained from:  Patient Demographic factors:  Age 76 or older Current Mental Status:  NA Loss Factors:  NA Historical Factors:  NA Risk Reduction Factors:  Living with another person, especially a relative  Total Time spent with patient: 1 hour Principal Problem: Altered mental status Diagnosis:  Principal Problem:   Altered mental status  Subjective Data: Megan Duncan is a 76 y.o. female.  She is brought in by family member is helping with a history.  Here with 1 week of nausea.  Also noticing some tremor in her hands.  Intermittently waking up at night with shortness of breath and chest tightness.  No new medications.  They talk to primary care doctor who recommended she come here and get a lithium level checked.  Has more chronic back problems which causes her to be minimally ambulatory.  Mostly bedbound.  Denies any current chest pain shortness of breath abdominal pain nausea vomiting diarrhea constipation or urinary symptoms.  No headache.  No numbness or weakness.  No fevers or chills.  Continued Clinical Symptoms:  Alcohol Use Disorder Identification Test Final Score (AUDIT): 1 The "Alcohol Use Disorders Identification Test", Guidelines for Use in Primary Care, Second Edition.  World Pharmacologist [facility]). Score between 0-7:  no or low risk or alcohol related problems. Score between 8-15:  moderate risk of alcohol related problems. Score between 16-19:  high risk of alcohol related problems. Score 20 or above:  warrants further diagnostic evaluation for alcohol dependence and treatment.   CLINICAL FACTORS:   Severe Anxiety and/or Agitation Depression:   Insomnia   Musculoskeletal: Strength & Muscle Tone: within normal limits Gait & Station: normal Patient leans: N/A  Psychiatric Specialty Exam:  Presentation  General Appearance: Bizarre; Disheveled  Eye Contact:Minimal  Speech:Pressured  Speech  Volume:Increased  Handedness:Right   Mood and Affect  Mood:Angry; Depressed; Dysphoric; Hopeless  Affect:Depressed; Tearful; Full Range   Thought Process  Thought Processes:Disorganized; Irrevelant  Descriptions of Associations:Loose  Orientation:Partial  Thought Content:Delusions; Illogical; Scattered; Paranoid Ideation  History of Schizophrenia/Schizoaffective disorder:No  Duration of Psychotic Symptoms:N/A  Hallucinations:Hallucinations: Auditory; Visual  Ideas of Reference:Delusions; Paranoia  Suicidal Thoughts:Suicidal Thoughts: Yes, Passive SI Passive Intent and/or Plan: Without Intent  Homicidal Thoughts:Homicidal Thoughts: No   Sensorium  Memory:Remote Poor  Judgment:Impaired  Insight:None   Executive Functions  Concentration:Poor  Attention Span:Poor  Recall:Poor  Fund of Knowledge:Poor  Language:Poor   Psychomotor Activity  Psychomotor Activity:Psychomotor Activity: Decreased   Assets  Assets:Financial Resources/Insurance; Housing; Intimacy; Social Support   Sleep  Sleep:Sleep: Poor    Physical Exam: Physical Exam Vitals and nursing note reviewed.  Constitutional:      Appearance: Normal appearance. She is normal weight.  Neurological:     General: No focal deficit present.     Mental Status: She is alert and oriented to person, place, and time.  Psychiatric:        Attention and Perception: Attention and perception normal.        Mood and Affect: Mood is anxious and depressed.        Speech: Speech normal.        Behavior: Behavior is agitated.        Thought Content: Thought content normal.        Cognition and Memory: She exhibits impaired recent memory.        Judgment: Judgment normal.   Review of Systems  Constitutional: Negative.   HENT: Negative.  Eyes: Negative.   Respiratory: Negative.    Cardiovascular: Negative.   Gastrointestinal: Negative.   Genitourinary: Negative.   Musculoskeletal: Negative.    Skin: Negative.   Neurological: Negative.   Endo/Heme/Allergies: Negative.   Psychiatric/Behavioral:  Positive for depression. The patient has insomnia.   Blood pressure (!) 116/98, pulse 87, temperature 98.1 F (36.7 C), temperature source Oral, resp. rate 18, height 5\' 2"  (1.575 m), weight 86.2 kg, SpO2 99 %. Body mass index is 34.75 kg/m.   COGNITIVE FEATURES THAT CONTRIBUTE TO RISK:  Loss of executive function    SUICIDE RISK:   Minimal: No identifiable suicidal ideation.  Patients presenting with no risk factors but with morbid ruminations; may be classified as minimal risk based on the severity of the depressive symptoms  PLAN OF CARE: See orders  I certify that inpatient services furnished can reasonably be expected to improve the patient's condition.   Parks Ranger, DO 12/03/2021, 12:03 PM

## 2021-12-03 NOTE — Tx Team (Signed)
Initial Treatment Plan 12/03/2021 4:21 AM [name] PND:[id number]    PATIENT STRESSORS: Health problems     PATIENT STRENGTHS: Ability for insight  General fund of knowledge  Supportive family/friends    PATIENT IDENTIFIED PROBLEMS: Anxiety  confusion                   DISCHARGE CRITERIA:  Ability to meet basic life and health needs Improved stabilization in mood, thinking, and/or behavior Medical problems require only outpatient monitoring Motivation to continue treatment in a less acute level of care Verbal commitment to aftercare and medication compliance  PRELIMINARY DISCHARGE PLAN: Outpatient therapy Return to previous living arrangement  PATIENT/FAMILY INVOLVEMENT: This treatment plan has been presented to and reviewed with the patient, Megan Duncan.  The patient  have been given the opportunity to ask questions and make suggestions.  Wylie Hail, RN 12/03/2021, 4:21 AM

## 2021-12-03 NOTE — BHH Suicide Risk Assessment (Addendum)
[place] INPATIENT:  Family/Significant Other Suicide Prevention Education  Suicide Prevention Education:  Patient Refusal for Family/Significant Other Suicide Prevention Education: The patient Megan Duncan has refused to provide written consent for family/significant other to be provided Family/Significant Other Suicide Prevention Education during admission and/or prior to discharge.  Physician notified.   CSW will attempt to obtain consent at a later time when patient is less acute.  Kenna Gilbert Maretta Overdorf 12/03/2021, 2:48 PM

## 2021-12-03 NOTE — Group Note (Signed)
[facility] LCSW Group Therapy Note    Group Date: 12/03/2021 Start Time: 1300 End Time: 1400  Type of Therapy and Topic:  Group Therapy:  Overcoming Obstacles  Participation Level:  BHH PARTICIPATION LEVEL: Did Not Attend  Mood:  Description of Group:   In this group patients will be encouraged to explore what they see as obstacles to their own wellness and recovery. They will be guided to discuss their thoughts, feelings, and behaviors related to these obstacles. The group will process together ways to cope with barriers, with attention given to specific choices patients can make. Each patient will be challenged to identify changes they are motivated to make in order to overcome their obstacles. This group will be process-oriented, with patients participating in exploration of their own experiences as well as giving and receiving support and challenge from other group members.  Therapeutic Goals: 1. Patient will identify personal and current obstacles as they relate to admission. 2. Patient will identify barriers that currently interfere with their wellness or overcoming obstacles.  3. Patient will identify feelings, thought process and behaviors related to these barriers. 4. Patient will identify two changes they are willing to make to overcome these obstacles:    Summary of Patient Progress   Patient did not attend group despite encouraged participation.    Therapeutic Modalities:   Cognitive Behavioral Therapy Solution Focused Therapy Motivational Interviewing Relapse Prevention Therapy   Durenda Hurt, [place]
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
## 2021-12-04 ENCOUNTER — Ambulatory Visit: Payer: Medicare Other | Admitting: Neurology

## 2021-12-04 MED ORDER — CHLORPROMAZINE HCL 50 MG PO TABS
50.0000 mg | ORAL_TABLET | Freq: Every evening | ORAL | Status: DC | PRN
  Filled 2021-12-04 (×2): qty 1

## 2021-12-04 MED ORDER — QUETIAPINE FUMARATE 100 MG PO TABS
100.0000 mg | ORAL_TABLET | Freq: Every day | ORAL | Status: DC
  Administered 2021-12-04: 100 mg via ORAL
  Filled 2021-12-04: qty 1

## 2021-12-04 MED ORDER — HALOPERIDOL 1 MG PO TABS
1.0000 mg | ORAL_TABLET | ORAL | Status: DC
  Administered 2021-12-04: 1 mg via ORAL
  Filled 2021-12-04 (×3): qty 1

## 2021-12-04 NOTE — BH IP Treatment Plan (Addendum)
Interdisciplinary Treatment and Diagnostic Plan Update  12/04/2021 Time of Session: 11:00AM Megan Duncan MRN: [id number]  Principal Diagnosis: Altered mental status  Secondary Diagnoses: Principal Problem:   Altered mental status   Current Medications:  Current Facility-Administered Medications  Medication Dose Route Frequency Provider Last Rate Last Admin   acetaminophen (TYLENOL) tablet 650 mg  650 mg Oral Q6H PRN Deloria Lair, NP   650 mg at 12/04/21 0418   alum & mag hydroxide-simeth (MAALOX/MYLANTA) 200-200-20 MG/5ML suspension 30 mL  30 mL Oral Q4H PRN Deloria Lair, NP       chlorproMAZINE (THORAZINE) tablet 50 mg  50 mg Oral QHS PRN Parks Ranger, DO       DULoxetine (CYMBALTA) DR capsule 60 mg  60 mg Oral Daily Doren Custard, Rashaun M, NP   60 mg at 12/04/21 1125   haloperidol (HALDOL) tablet 1 mg  1 mg Oral [id number] Parks Ranger, DO       levothyroxine (SYNTHROID) tablet 50 mcg  50 mcg Oral QAC breakfast Deloria Lair, NP   50 mcg at 12/04/21 1125   LORazepam (ATIVAN) tablet 1 mg  1 mg Oral Q4H PRN Parks Ranger, DO   1 mg at 12/04/21 0459   magnesium hydroxide (MILK OF MAGNESIA) suspension 30 mL  30 mL Oral Daily PRN Deloria Lair, NP       metFORMIN (GLUCOPHAGE) tablet 1,000 mg  1,000 mg Oral BID WC Dixon, Rashaun M, NP   1,000 mg at 12/04/21 1126   OLANZapine (ZYPREXA) tablet 5 mg  5 mg Oral Q4H PRN Parks Ranger, DO   5 mg at 12/04/21 0418   propranolol (INDERAL) tablet 20 mg  20 mg Oral TID Deloria Lair, NP   20 mg at 12/04/21 1126   QUEtiapine (SEROQUEL) tablet 100 mg  100 mg Oral QHS Parks Ranger, DO       PTA Medications: Medications Prior to Admission  Medication Sig Dispense Refill Last Dose   albuterol (VENTOLIN HFA) 108 (90 Base) MCG/ACT inhaler Inhale 2 puffs into the lungs every 4 (four) hours as needed. For shortness of breath and wheezing 18 g 3    ALPRAZolam (XANAX) 0.5 MG tablet Take 30 min  prior to procedure 10 tablet 0    amLODipine (NORVASC) 5 MG tablet TAKE 1 TABLET (5 MG TOTAL) BY MOUTH DAILY. 90 tablet 1    blood glucose meter kit and supplies Dispense based on patient and insurance preference. Use once a day.  DX Code: E11.9 1 each 0    cephALEXin (KEFLEX) 500 MG capsule Take 1 capsule (500 mg total) by mouth 2 (two) times daily. 14 capsule 0    clorazepate (TRANXENE) 3.75 MG tablet Take 1 tablet (3.75 mg total) by mouth 2 (two) times daily as needed for anxiety or sleep. 60 tablet 1    DULoxetine (CYMBALTA) 30 MG capsule Take 1 capsule (30 mg total) by mouth daily. For 2 weeks, then start 60 mg daily. 14 capsule 0    DULoxetine (CYMBALTA) 60 MG capsule TAKE 1 CAPSULE BY MOUTH EVERY DAY IN THE MORNING, after taking 30 mg for 2 weeks. 90 capsule 0    glucose blood test strip Use as instructed once a day.  Dx Code: E11.9 30 each 2    hydrocortisone-pramoxine (ANALPRAM HC) 2.5-1 % rectal cream Place 1 application rectally 3 (three) times daily. 30 g 3    Lancets MISC Use as directed once a day.  Dx code: E11.9 30 each 2    levothyroxine (SYNTHROID) 50 MCG tablet TAKE 1 TABLET BY MOUTH DAILY BEFORE BREAKFAST 90 tablet 1    lithium carbonate 150 MG capsule TAKE 1 CAPSULE (150 MG TOTAL) BY MOUTH DAILY, TAKE WITH LITHIUM 600 MG TO EQUAL 750 MG TOTAL 90 capsule 0    lithium carbonate 300 MG capsule TAKE 2 CAPSULES (600 MG) BY MOUTH AT BEDTIME, WITH 150 MG CAPSULE 180 capsule 0    metFORMIN (GLUCOPHAGE) 1000 MG tablet Take 1 tablet (1,000 mg total) by mouth 2 (two) times daily with a meal. 180 tablet 1    ondansetron (ZOFRAN) 4 MG tablet Take 1 tablet (4 mg total) by mouth every 8 (eight) hours as needed for nausea or vomiting. 15 tablet 0    propranolol (INDERAL) 20 MG tablet Take 1 tablet (20 mg total) by mouth 3 (three) times daily. 90 tablet 0    QUEtiapine (SEROQUEL) 50 MG tablet Take 1 tablet (50 mg total) by mouth at bedtime. 30 tablet 2    traZODone (DESYREL) 50 MG tablet TAKE  1/2 TO 2 TABLETS BY MOUTH EVERY DAY AT BEDTIME AS NEEDED FOR SLEEP 180 tablet 0     Patient Stressors: Health problems    Patient Strengths: Ability for insight  General fund of knowledge  Supportive family/friends   Treatment Modalities: Medication Management, Group therapy, Case management,  1 to 1 session with clinician, Psychoeducation, Recreational therapy.   Physician Treatment Plan for Primary Diagnosis: Altered mental status Long Term Goal(s): Improvement in symptoms so as ready for discharge   Short Term Goals: Ability to identify changes in lifestyle to reduce recurrence of condition will improve Ability to verbalize feelings will improve Ability to disclose and discuss suicidal ideas Ability to demonstrate self-control will improve Ability to identify and develop effective coping behaviors will improve Ability to maintain clinical measurements within normal limits will improve Compliance with prescribed medications will improve Ability to identify triggers associated with substance abuse/mental health issues will improve  Medication Management: Evaluate patient's response, side effects, and tolerance of medication regimen.  Therapeutic Interventions: 1 to 1 sessions, Unit Group sessions and Medication administration.  Evaluation of Outcomes: Not Met  Physician Treatment Plan for Secondary Diagnosis: Principal Problem:   Altered mental status  Long Term Goal(s): Improvement in symptoms so as ready for discharge   Short Term Goals: Ability to identify changes in lifestyle to reduce recurrence of condition will improve Ability to verbalize feelings will improve Ability to disclose and discuss suicidal ideas Ability to demonstrate self-control will improve Ability to identify and develop effective coping behaviors will improve Ability to maintain clinical measurements within normal limits will improve Compliance with prescribed medications will improve Ability to  identify triggers associated with substance abuse/mental health issues will improve     Medication Management: Evaluate patient's response, side effects, and tolerance of medication regimen.  Therapeutic Interventions: 1 to 1 sessions, Unit Group sessions and Medication administration.  Evaluation of Outcomes: Not Met   RN Treatment Plan for Primary Diagnosis: Altered mental status Long Term Goal(s): Knowledge of disease and therapeutic regimen to maintain health will improve  Short Term Goals: Ability to remain free from injury will improve, Ability to verbalize frustration and anger appropriately will improve, Ability to demonstrate self-control, Ability to participate in decision making will improve, Ability to verbalize feelings will improve, Ability to identify and develop effective coping behaviors will improve, and Compliance with prescribed medications will improve  Medication Management: RN  will administer medications as ordered by provider, will assess and evaluate patient's response and provide education to patient for prescribed medication. RN will report any adverse and/or side effects to prescribing provider.  Therapeutic Interventions: 1 on 1 counseling sessions, Psychoeducation, Medication administration, Evaluate responses to treatment, Monitor vital signs and CBGs as ordered, Perform/monitor CIWA, COWS, AIMS and Fall Risk screenings as ordered, Perform wound care treatments as ordered.  Evaluation of Outcomes: Not Met   LCSW Treatment Plan for Primary Diagnosis: Altered mental status Long Term Goal(s): Safe transition to appropriate next level of care at discharge, Engage patient in therapeutic group addressing interpersonal concerns.  Short Term Goals: Engage patient in aftercare planning with referrals and resources, Increase social support, Increase ability to appropriately verbalize feelings, Increase emotional regulation, Facilitate acceptance of mental health diagnosis  and concerns, Identify triggers associated with mental health/substance abuse issues, and Increase skills for wellness and recovery  Therapeutic Interventions: Assess for all discharge needs, 1 to 1 time with Social worker, Explore available resources and support systems, Assess for adequacy in community support network, Educate family and significant other(s) on suicide prevention, Complete Psychosocial Assessment, Interpersonal group therapy.  Evaluation of Outcomes: Not Met   Progress in Treatment: Attending groups: Yes. Participating in groups: Yes. Taking medication as prescribed: Yes. Toleration medication: Yes. Family/Significant other contact made: No, will contact:  when given permission Patient understands diagnosis: No. Discussing patient identified problems/goals with staff: No. Medical problems stabilized or resolved: Yes. Denies suicidal/homicidal ideation: Yes. Issues/concerns per patient self-inventory: No. Other: None  New problem(s) identified: No, Describe:  None  New Short Term/Long Term Goal(s): Patient to work towards elimination of symptoms of psychosis, medication management for mood stabilization; development of comprehensive mental wellness plan.   Patient Goals:  Patient was unable to participate in treatment team.   Discharge Plan or Barriers: CSW will assist pt in development of appropriate discharge/aftercare plan.  Reason for Continuation of Hospitalization: Aggression Delusions  Depression Medication stabilization  Estimated Length of Stay: TBD   Scribe for Treatment Team: Savi Lastinger A [place], LCSWA 12/04/2021 3:52 PM
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
## 2021-12-04 NOTE — Group Note (Signed)
[facility] LCSW Group Therapy Note    Group Date: 12/04/2021 Start Time: 1330 End Time: 1420  Type of Therapy and Topic:  Group Therapy:  Overcoming Obstacles  Participation Level:  BHH PARTICIPATION LEVEL: Minimal  Mood:  Description of Group:   In this group patients will be encouraged to explore what they see as obstacles to their own wellness and recovery. They will be guided to discuss their thoughts, feelings, and behaviors related to these obstacles. The group will process together ways to cope with barriers, with attention given to specific choices patients can make. Each patient will be challenged to identify changes they are motivated to make in order to overcome their obstacles. This group will be process-oriented, with patients participating in exploration of their own experiences as well as giving and receiving support and challenge from other group members.  Therapeutic Goals: 1. Patient will identify personal and current obstacles as they relate to admission. 2. Patient will identify barriers that currently interfere with their wellness or overcoming obstacles.  3. Patient will identify feelings, thought process and behaviors related to these barriers. 4. Patient will identify two changes they are willing to make to overcome these obstacles:    Summary of Patient Progress   Patient was present for the entirety of the group session. Patient was incoherent and tangential but did participate in discussion.     Therapeutic Modalities:   Cognitive Behavioral Therapy Solution Focused Therapy Motivational Interviewing Relapse Prevention Therapy   Quentin Shorey A [place], LCSWA

## 2021-12-04 NOTE — Progress Notes (Signed)
PRNs had no effect on patient. Pt continues to yell out "mother" and tries to get out of bed restless tossing in the bed. Ativan 1 mg given as per MAR orders. Pt remains safe on the unit at this time with 1:1 sitter in place.

## 2021-12-04 NOTE — Progress Notes (Signed)
Pt remains on 1:1 sitter care. Pt alert and oriented to self, with speech incoherent at times.  Pt denies SI/HI/AVH. Pt's has tangential speech. When asked how she was doing, the patient responded, "I know what I did, and he knows what he did. He asked me to do it, but I didn't want to". Pt then continues "I have something in my belly, I have a lot of problems". Pt then mumbles a few incoherent words. Pt watched TV in the dayroom until bedtime. Pt offered snacks and refreshments. Q15 min safety checks maintained.

## 2021-12-04 NOTE — Progress Notes (Addendum)
Patient woke up yelling "help" and "mom". Pt tries to get up out of bed and is confused. She is agitated and refuses help from staff. Zyprexa 5mg  given as per MAR orders. Pt stated pain is 10/10. Medication given for pain as per MAR orders. Pt needs reinforcement to take PO medications as she initially refuses them.  Pt remains safe on the unit at his time with 1:1 sitter in place.

## 2021-12-04 NOTE — Progress Notes (Addendum)
Patient appears as sullen seen in the dayroom with daughter in a recliner. Pt denies SI, HI, and AVH. Pt can be irritable at times. Pt is oriented to self and place only. Pt rated pain a 2/10 in her lower back. Patient is hesitant to take medications but will with encouragement. Pt remains safe on the unit at this time with 1:1 sitter in place.

## 2021-12-04 NOTE — Telephone Encounter (Signed)
Noted  

## 2021-12-04 NOTE — Progress Notes (Signed)
[facility] MD Progress Note  12/04/2021 10:24 AM Megan Duncan  MRN:  [id number] Subjective: Megan Duncan continues to be confused, agitated, and combative.  She needs a lot of prompting to take her medications.  She did not sleep very well and the nurses state that her as needed medication was not helpful.  Her thought process is very tangential.  She eventually did go to sleep last night.  PRNs had no effect on patient. Pt continues to yell out "mother" and tries to get out of bed restless tossing in the bed. Ativan 1 mg given as per MAR orders. Pt remains safe on the unit at this time with 1:1 sitter in place.   Principal Problem: Altered mental status Diagnosis: Principal Problem:   Altered mental status  Total Time spent with patient: 15 minutes  Past Psychiatric History: Bipolar Disorder on Lithium for years.  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/22/2020   Degenerative lumbar spinal stenosis    Dermatitis 76/28/3151   Diastolic dysfunction    Per pt, diagnosed after [facility]   Diverticulosis    DOE (dyspnea on exertion) 07/20/2016   Essential (primary) hypertension 04/23/2007   Estrogen deficiency 09/01/2017   External hemorrhoid 08/01/2015   Fibromyalgia    Foraminal stenosis of lumbar region 08/22/2020   Generalized anxiety disorder 09/01/2018   Hyperlipidemia    Inflammatory disease of uterus  04/23/2007   Left flank pain 07/13/2021   Left upper quadrant pain 09/01/2017   Lower abdominal pain 08/06/2012   Lumbar back pain with radiculopathy affecting left lower extremity 01/24/2010   Lumbar radiculopathy 01/26/2021   Lump in upper inner quadrant of left breast 07/13/2021   Major depressive disorder    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   Otitis media 09/05/2010   Palpitations 07/20/2016   Peripheral neuropathy 02/22/2010   Retrolisthesis of vertebrae 08/22/2020   Shingles outbreak 04/14/2015   Sinusitis, acute maxillary 09/14/2013   Stress incontinence, female 08/01/2015   Thoracic aortic atherosclerosis 05/14/2017   Tinea corporis 09/01/2017   Tinnitus 03/04/2014   Torticollis 03/04/2014   Type II diabetes mellitus 06/05/2018    Past Surgical History:  Procedure Laterality Date   APPENDECTOMY     EYE SURGERY     KNEE ARTHROSCOPY Right 11/12/2006   TONSILLECTOMY     TOTAL ABDOMINAL HYSTERECTOMY     Family History:  Family History  Problem Relation Age of Onset   Hypertension Mother    Hiatal hernia Mother    Diabetes Father    Hypertension Father    Heart attack Father    Alcoholism Father    Bipolar disorder Father    Mental illness Sister    Suicidality Sister    Colon cancer Maternal Aunt 80   Breast cancer Maternal Aunt    Bipolar disorder Paternal Aunt     Social History:  Social History  Substance and Sexual Activity  Alcohol Use Yes   Alcohol/week: 0.0 - 1.0 standard drinks   Comment: occasional wine     Social History   Substance and Sexual Activity  Drug Use No    Social History   Socioeconomic History   Marital status: Married    Spouse name: Oria Klimas   Number of children: 3   Years of education: 14   Highest education level: Some college, no degree  Occupational History   Occupation: retired  Tobacco Use   Smoking status: Former    Packs/day: 1.50    Years: 40.00    Pack years: 60.00     Types: Cigarettes    Quit date: 07/02/2004    Years since quitting: 17.4    Passive exposure: Past   Smokeless tobacco: Never   Tobacco comments:    Verified by Aldona Lento (Husband), & Lauralee Evener (Daughter)  Vaping Use   Vaping Use: Never used  Substance and Sexual Activity   Alcohol use: Yes    Alcohol/week: 0.0 - 1.0 standard drinks    Comment: occasional wine   Drug use: No   Sexual activity: Never  Other Topics Concern   Not on file  Social History Narrative   Lives with husband and grandson she is raising.   11/07/21 Lives with husband   Social Determinants of Health   Financial Resource Strain: Medium Risk   Difficulty of Paying Living Expenses: Somewhat hard  Food Insecurity: No Food Insecurity   Worried About Charity fundraiser in the Last Year: Never true   Ran Out of Food in the Last Year: Never true  Transportation Needs: No Transportation Needs   Lack of Transportation (Medical): No   Lack of Transportation (Non-Medical): No  Physical Activity: Inactive   Days of Exercise per Week: 0 days   Minutes of Exercise per Session: 0 min  Stress: No Stress Concern Present   Feeling of Stress : Not at all  Social Connections: Socially Integrated   Frequency of Communication with Friends and Family: More than three times a week   Frequency of Social Gatherings with Friends and Family: More than three times a week   Attends Religious Services: 1 to 4 times per year   Active Member of [employer] or Organizations: Yes   Attends Music therapist: More than 4 times per year   Marital Status: Married   Additional Social History:                         Sleep: Poor  Appetite:  Fair  Current Medications: Current Facility-Administered Medications  Medication Dose Route Frequency Provider Last Rate Last Admin   acetaminophen (TYLENOL) tablet 650 mg  650 mg Oral Q6H PRN Deloria Lair, NP   650 mg at 12/04/21 0418   alum & mag  hydroxide-simeth (MAALOX/MYLANTA) 200-200-20 MG/5ML suspension 30 mL  30 mL Oral Q4H PRN Deloria Lair, NP       chlorproMAZINE (THORAZINE) tablet 50 mg  50 mg Oral QHS PRN Parks Ranger, DO       DULoxetine (CYMBALTA) DR capsule 60 mg  60 mg Oral Daily Dixon, Rashaun M, NP   60 mg at 12/03/21 1129   haloperidol (HALDOL) tablet 1 mg  1 mg Oral [id number] Thomasene Dubow Edward, DO       levothyroxine (SYNTHROID) tablet 50 mcg  50 mcg Oral QAC breakfast Deloria Lair, NP  LORazepam (ATIVAN) tablet 1 mg  1 mg Oral Q4H PRN Parks Ranger, DO   1 mg at 12/04/21 0459   magnesium hydroxide (MILK OF MAGNESIA) suspension 30 mL  30 mL Oral Daily PRN Deloria Lair, NP       metFORMIN (GLUCOPHAGE) tablet 1,000 mg  1,000 mg Oral BID WC Dixon, Rashaun M, NP   1,000 mg at 12/03/21 1707   OLANZapine (ZYPREXA) tablet 5 mg  5 mg Oral Q4H PRN Parks Ranger, DO   5 mg at 12/04/21 0418   propranolol (INDERAL) tablet 20 mg  20 mg Oral TID Deloria Lair, NP   20 mg at 12/03/21 1700   QUEtiapine (SEROQUEL) tablet 100 mg  100 mg Oral QHS Parks Ranger, DO        Lab Results:  Results for orders placed or performed during the hospital encounter of 12/02/21 (from the past 48 hour(s))  Comprehensive metabolic panel     Status: Abnormal   Collection Time: 12/02/21  7:57 PM  Result Value Ref Range   Sodium 138 135 - 145 mmol/L   Potassium 3.9 3.5 - 5.1 mmol/L   Chloride 106 98 - 111 mmol/L   CO2 21 (L) 22 - 32 mmol/L   Glucose, Bld 128 (H) 70 - 99 mg/dL    Comment: Glucose reference range applies only to samples taken after fasting for at least 8 hours.   BUN 12 8 - 23 mg/dL   Creatinine, Ser 0.81 0.44 - 1.00 mg/dL   Calcium 9.7 8.9 - 10.3 mg/dL   Total Protein 7.3 6.5 - 8.1 g/dL   Albumin 4.3 3.5 - 5.0 g/dL   AST 24 15 - 41 U/L   ALT 20 0 - 44 U/L   Alkaline Phosphatase 71 38 - 126 U/L   Total Bilirubin 0.9 0.3 - 1.2 mg/dL   GFR, Estimated >60 >60 mL/min     Comment: (NOTE) Calculated using the CKD-EPI Creatinine Equation (2021)    Anion gap 11 5 - 15    Comment: Performed at [facility], [address] [place], [place] [address]  CBC     Status: Abnormal   Collection Time: 12/02/21  7:57 PM  Result Value Ref Range   WBC 10.3 4.0 - 10.5 K/uL   RBC 4.57 3.87 - 5.11 MIL/uL   Hemoglobin 14.3 12.0 - 15.0 g/dL   HCT 45.8 36.0 - 46.0 %   MCV 100.2 (H) 80.0 - 100.0 fL   MCH 31.3 26.0 - 34.0 pg   MCHC 31.2 30.0 - 36.0 g/dL   RDW 13.7 11.5 - 15.5 %   Platelets 357 150 - 400 K/uL   nRBC 0.0 0.0 - 0.2 %    Comment: Performed at [facility], [address] [place], [place] [address]  Blood gas, venous     Status: Abnormal   Collection Time: 12/02/21 10:20 PM  Result Value Ref Range   pH, Ven 7.28 7.250 - 7.430   pCO2, Ven 51 44.0 - 60.0 mmHg   pO2, Ven 33.0 32.0 - 45.0 mmHg   Bicarbonate 24.0 20.0 - 28.0 mmol/L   Acid-base deficit 3.3 (H) 0.0 - 2.0 mmol/L   O2 Saturation 54.4 %   Patient temperature 37.0    Collection site VEIN    Sample type VENOUS     Comment: Performed at [facility], [address] [place],  [address]  Urinalysis, Routine w reflex microscopic Urine, Clean Catch  Status: Abnormal   Collection Time: 12/02/21 10:38 PM  Result Value Ref Range   Color, Urine YELLOW (A) YELLOW   APPearance HAZY (A) CLEAR   Specific Gravity, Urine 1.014 1.005 - 1.030   pH 6.0 5.0 - 8.0   Glucose, UA NEGATIVE NEGATIVE mg/dL   Hgb urine dipstick NEGATIVE NEGATIVE   Bilirubin Urine NEGATIVE NEGATIVE   Ketones, ur 80 (A) NEGATIVE mg/dL   Protein, ur NEGATIVE NEGATIVE mg/dL   Nitrite NEGATIVE NEGATIVE   Leukocytes,Ua LARGE (A) NEGATIVE   RBC / HPF 11-20 0 - 5 RBC/hpf   WBC, UA 21-50 0 - 5 WBC/hpf   Bacteria, UA NONE SEEN NONE SEEN   Squamous Epithelial / LPF 6-10 0 - 5   Mucus PRESENT    Hyaline Casts, UA PRESENT     Comment: Performed at [facility], [address]  [place], [place] [address]  Acetaminophen level     Status: Abnormal   Collection Time: 12/02/21 10:38 PM  Result Value Ref Range   Acetaminophen (Tylenol), Serum <10 (L) 10 - 30 ug/mL    Comment: (NOTE) Therapeutic concentrations vary significantly. A range of 10-30 ug/mL  may be an effective concentration for many patients. However, some  are best treated at concentrations outside of this range. Acetaminophen concentrations >150 ug/mL at 4 hours after ingestion  and >50 ug/mL at 12 hours after ingestion are often associated with  toxic reactions.  Performed at [facility], [address] [place], [place] [address]   Ethanol     Status: None   Collection Time: 12/02/21 10:38 PM  Result Value Ref Range   Alcohol, Ethyl (B) <10 <10 mg/dL    Comment: (NOTE) Lowest detectable limit for serum alcohol is 10 mg/dL.  For medical purposes only. Performed at [facility], [address] [place], [place] [address]   Lactic acid, plasma     Status: None   Collection Time: 12/02/21 10:38 PM  Result Value Ref Range   Lactic Acid, Venous 1.2 0.5 - 1.9 mmol/L    Comment: Performed at [facility], [address] [place], [place] [address]  Salicylate level     Status: Abnormal   Collection Time: 12/02/21 10:38 PM  Result Value Ref Range   Salicylate Lvl <2.9 (L) 7.0 - 30.0 mg/dL    Comment: Performed at [facility], [address], [place] [address]  Troponin I (High Sensitivity)     Status: None   Collection Time: 12/02/21 10:38 PM  Result Value Ref Range   Troponin I (High Sensitivity) 6 <18 ng/L    Comment: (NOTE) Elevated high sensitivity troponin I (hsTnI) values and significant  changes across serial measurements may suggest ACS but many other  chronic and acute conditions are known to elevate hsTnI results.  Refer to the "Links" section for chest pain algorithms and additional  guidance. Performed at [facility], [address] [place], [place] [address]   Urine Drug Screen, Qualitative     Status: Abnormal   Collection Time: 12/02/21 10:38 PM  Result Value Ref Range   Tricyclic, Ur Screen NONE DETECTED NONE DETECTED   Amphetamines, Ur Screen NONE DETECTED NONE DETECTED   MDMA (Ecstasy)Ur Screen NONE DETECTED NONE DETECTED   Cocaine Metabolite,Ur [place] NONE DETECTED NONE DETECTED   Opiate, Ur Screen NONE DETECTED NONE DETECTED   Phencyclidine (PCP) Ur S NONE DETECTED NONE DETECTED   Cannabinoid 50 Ng, Ur [place] NONE DETECTED NONE  DETECTED   Barbiturates, Ur Screen NONE DETECTED NONE DETECTED   Benzodiazepine, Ur Scrn POSITIVE (A) NONE DETECTED   Methadone Scn, Ur NONE DETECTED NONE DETECTED    Comment: (NOTE) Tricyclics + metabolites, urine    Cutoff 1000 ng/mL Amphetamines + metabolites, urine  Cutoff 1000 ng/mL MDMA (Ecstasy), urine              Cutoff 500 ng/mL Cocaine Metabolite, urine          Cutoff 300 ng/mL Opiate + metabolites, urine        Cutoff 300 ng/mL Phencyclidine (PCP), urine         Cutoff 25 ng/mL Cannabinoid, urine                 Cutoff 50 ng/mL Barbiturates + metabolites, urine  Cutoff 200 ng/mL Benzodiazepine, urine              Cutoff 200 ng/mL Methadone, urine                   Cutoff 300 ng/mL  The urine drug screen provides only a preliminary, unconfirmed analytical test result and should not be used for non-medical purposes. Clinical consideration and professional judgment should be applied to any positive drug screen result due to possible interfering substances. A more specific alternate chemical method must be used in order to obtain a confirmed analytical result. Gas chromatography / mass spectrometry (GC/MS) is the preferred confirm atory method. Performed at [facility], [address] [place], [place] [address]   Lithium level     Status: Abnormal   Collection Time: 12/02/21 10:38 PM  Result Value Ref Range   Lithium Lvl 1.39 (H) 0.60 - 1.20  mmol/L    Comment: Performed at [facility], [address] [place], [place] [address]  TSH     Status: None   Collection Time: 12/02/21 10:38 PM  Result Value Ref Range   TSH 0.693 0.350 - 4.500 uIU/mL    Comment: Performed by a 3rd Generation assay with a functional sensitivity of <=0.01 uIU/mL. Performed at [facility], [address] [place], [place] [address]   CBC with Differential/Platelet     Status: None   Collection Time: 12/02/21 10:38 PM  Result Value Ref Range   WBC 9.6 4.0 - 10.5 K/uL   RBC 4.47 3.87 - 5.11 MIL/uL   Hemoglobin 13.9 12.0 - 15.0 g/dL   HCT 44.0 36.0 - 46.0 %   MCV 98.4 80.0 - 100.0 fL   MCH 31.1 26.0 - 34.0 pg   MCHC 31.6 30.0 - 36.0 g/dL   RDW 13.6 11.5 - 15.5 %   Platelets 332 150 - 400 K/uL   nRBC 0.0 0.0 - 0.2 %   Neutrophils Relative % 66 %   Neutro Abs 6.3 1.7 - 7.7 K/uL   Lymphocytes Relative 20 %   Lymphs Abs 1.9 0.7 - 4.0 K/uL   Monocytes Relative 10 %   Monocytes Absolute 1.0 0.1 - 1.0 K/uL   Eosinophils Relative 3 %   Eosinophils Absolute 0.3 0.0 - 0.5 K/uL   Basophils Relative 1 %   Basophils Absolute 0.1 0.0 - 0.1 K/uL   Immature Granulocytes 0 %   Abs Immature Granulocytes 0.03 0.00 - 0.07 K/uL    Comment: Performed at [facility], [address] [place], [place] [address]  Resp Panel by RT-PCR (Flu A&B, Covid) Nasopharyngeal Swab     Status: None   Collection  Time: 12/03/21  1:40 AM   Specimen: Nasopharyngeal Swab; Nasopharyngeal(NP) swabs in vial transport medium  Result Value Ref Range   SARS Coronavirus 2 by RT PCR NEGATIVE NEGATIVE    Comment: (NOTE) SARS-CoV-2 target nucleic acids are NOT DETECTED.  The SARS-CoV-2 RNA is generally detectable in upper respiratory specimens during the acute phase of infection. The lowest concentration of SARS-CoV-2 viral copies this assay can detect is 138 copies/mL. A negative result does not preclude SARS-Cov-2 infection and should not be used as the  sole basis for treatment or other patient management decisions. A negative result may occur with  improper specimen collection/handling, submission of specimen other than nasopharyngeal swab, presence of viral mutation(s) within the areas targeted by this assay, and inadequate number of viral copies(<138 copies/mL). A negative result must be combined with clinical observations, patient history, and epidemiological information. The expected result is Negative.  Fact Sheet for Patients:  EntrepreneurPulse.com.au  Fact Sheet for Healthcare Providers:  IncredibleEmployment.be  This test is no t yet approved or cleared by the [place] FDA and  has been authorized for detection and/or diagnosis of SARS-CoV-2 by FDA under an Emergency Use Authorization (EUA). This EUA will remain  in effect (meaning this test can be used) for the duration of the COVID-19 declaration under Section 564(b)(1) of the Act, 21 U.S.C.section 360bbb-3(b)(1), unless the authorization is terminated  or revoked sooner.       Influenza A by PCR NEGATIVE NEGATIVE   Influenza B by PCR NEGATIVE NEGATIVE    Comment: (NOTE) The Xpert Xpress SARS-CoV-2/FLU/RSV plus assay is intended as an aid in the diagnosis of influenza from Nasopharyngeal swab specimens and should not be used as a sole basis for treatment. Nasal washings and aspirates are unacceptable for Xpert Xpress SARS-CoV-2/FLU/RSV testing.  Fact Sheet for Patients: EntrepreneurPulse.com.au  Fact Sheet for Healthcare Providers: IncredibleEmployment.be  This test is not yet approved or cleared by the [place] FDA and has been authorized for detection and/or diagnosis of SARS-CoV-2 by FDA under an Emergency Use Authorization (EUA). This EUA will remain in effect (meaning this test can be used) for the duration of the COVID-19 declaration under Section 564(b)(1) of the Act, 21  U.S.C. section 360bbb-3(b)(1), unless the authorization is terminated or revoked.  Performed at [facility], [place], [place] [address]   Troponin I (High Sensitivity)     Status: None   Collection Time: 12/03/21  2:33 AM  Result Value Ref Range   Troponin I (High Sensitivity) 5 <18 ng/L    Comment: (NOTE) Elevated high sensitivity troponin I (hsTnI) values and significant  changes across serial measurements may suggest ACS but many other  chronic and acute conditions are known to elevate hsTnI results.  Refer to the "Links" section for chest pain algorithms and additional  guidance. Performed at [facility], [address] [place], [place] [address]   Ammonia     Status: None   Collection Time: 12/03/21  2:33 AM  Result Value Ref Range   Ammonia 16 9 - 35 umol/L    Comment: Performed at [facility], [address] [place], [place] [address]    Blood Alcohol level:  Lab Results  Component Value Date   [facility] <10 60/63/0160    Metabolic Disorder Labs: Lab Results  Component Value Date   HGBA1C 6.4 10/27/2021   No results found for: PROLACTIN Lab Results  Component Value Date   CHOL 186 10/19/2021   TRIG  140.0 10/19/2021   HDL 70.90 10/19/2021   CHOLHDL 3 10/19/2021   VLDL 28.0 10/19/2021   LDLCALC 87 10/19/2021   LDLCALC 99 09/22/2020    Physical Findings: AIMS:  , ,  ,  ,    CIWA:    COWS:     Musculoskeletal: Strength & Muscle Tone: within normal limits Gait & Station: unsteady Patient leans: N/A  Psychiatric Specialty Exam:  Presentation  General Appearance: Bizarre; Disheveled  Eye Contact:Minimal  Speech:Pressured  Speech Volume:Increased  Handedness:Right   Mood and Affect  Mood:Angry; Depressed; Dysphoric; Hopeless  Affect:Depressed; Tearful; Full Range   Thought Process  Thought Processes:Disorganized; Irrevelant  Descriptions of  Associations:Loose  Orientation:Partial  Thought Content:Delusions; Illogical; Scattered; Paranoid Ideation  History of Schizophrenia/Schizoaffective disorder:No  Duration of Psychotic Symptoms:N/A  Hallucinations:Hallucinations: Auditory; Visual  Ideas of Reference:Delusions; Paranoia  Suicidal Thoughts:Suicidal Thoughts: Yes, Passive SI Passive Intent and/or Plan: Without Intent  Homicidal Thoughts:Homicidal Thoughts: No   Sensorium  Memory:Remote Poor  Judgment:Impaired  Insight:None   Executive Functions  Concentration:Poor  Attention Span:Poor  Recall:Poor  Fund of Knowledge:Poor  Language:Poor   Psychomotor Activity  Psychomotor Activity:Psychomotor Activity: Decreased   Assets  Assets:Financial Resources/Insurance; Housing; Intimacy; Social Support   Sleep  Sleep:Sleep: Poor    Physical Exam: Physical Exam Vitals and nursing note reviewed.  Constitutional:      Appearance: Normal appearance. She is normal weight.  Neurological:     General: No focal deficit present.     Mental Status: She is alert and oriented to person, place, and time.  Psychiatric:        Attention and Perception: Attention and perception normal.        Mood and Affect: Mood is anxious. Affect is inappropriate.        Speech: Speech is tangential.        Behavior: Behavior is uncooperative and agitated.        Thought Content: Thought content is paranoid and delusional.        Cognition and Memory: Cognition is impaired. Memory is impaired.        Judgment: Judgment is impulsive and inappropriate.   Review of Systems  Constitutional: Negative.   HENT: Negative.    Eyes: Negative.   Respiratory: Negative.    Cardiovascular: Negative.   Gastrointestinal: Negative.   Genitourinary: Negative.   Musculoskeletal: Negative.   Skin: Negative.   Neurological: Negative.   Endo/Heme/Allergies: Negative.   Psychiatric/Behavioral:  Positive for hallucinations and memory  loss. The patient has insomnia.   Blood pressure 109/60, pulse 65, temperature (!) 97.5 F (36.4 C), temperature source Oral, resp. rate 18, height 5\' 2"  (1.575 m), weight 86.2 kg, SpO2 95 %. Body mass index is 34.75 kg/m.   Treatment Plan Summary: Daily contact with patient to assess and evaluate symptoms and progress in treatment, Medication management, and Plan discontinue Risperdal.  Start Haldol 1 mg twice a day.  Increase Seroquel to 100 mg at bedtime.  Continue as needed Zyprexa and Ativan.  Start Thorazine 50 mg at bedtime as needed for sleep.  Parks Ranger, DO 12/04/2021, 10:24 AM

## 2021-12-05 ENCOUNTER — Telehealth: Payer: Self-pay | Admitting: Family Medicine

## 2021-12-05 ENCOUNTER — Other Ambulatory Visit: Payer: Medicare Other

## 2021-12-05 DIAGNOSIS — F315 Bipolar disorder, current episode depressed, severe, with psychotic features: Secondary | ICD-10-CM | POA: Diagnosis not present

## 2021-12-05 DIAGNOSIS — R4182 Altered mental status, unspecified: Secondary | ICD-10-CM | POA: Diagnosis not present

## 2021-12-05 MED ORDER — BENZTROPINE MESYLATE 1 MG PO TABS
0.5000 mg | ORAL_TABLET | ORAL | Status: DC
  Administered 2021-12-05 – 2021-12-07 (×4): 0.5 mg via ORAL
  Filled 2021-12-05 (×4): qty 1

## 2021-12-05 MED ORDER — HALOPERIDOL 2 MG PO TABS
2.0000 mg | ORAL_TABLET | ORAL | Status: DC
  Administered 2021-12-05 – 2021-12-07 (×4): 2 mg via ORAL
  Filled 2021-12-05 (×5): qty 1

## 2021-12-05 MED ORDER — CHLORPROMAZINE HCL 25 MG/ML IJ SOLN
50.0000 mg | Freq: Every day | INTRAMUSCULAR | Status: DC
  Administered 2021-12-05 (×2): 50 mg via INTRAMUSCULAR
  Filled 2021-12-05 (×3): qty 2

## 2021-12-05 NOTE — Progress Notes (Signed)
Patient awake, alert with some confusion. Patient denies any pain , SI,HI,AVH. Patient endorses anxiety a 5/10 and denies Depression. Patient did not eat breakfast nor drink any fluids, also morning medications not given at scheduled time per MD r/t patient sleeping all morning. Medications administered at this time and patient tolerated well. Patient denies any pain at this time. Patient currently in dayroom with 1:1 sitter. Patient has very poor appetite, refused her lunch, offered a bowl of fruits and patient consumed about 30%. Patient has had zero output most of this shift. Patient encouraged to drink fluids.  Emotional support and re-enforcement provided.Q 15 minutes safety checks maintained. Will continue to monitor.

## 2021-12-05 NOTE — Progress Notes (Signed)
[facility] MD Progress Note  12/05/2021 11:54 AM Megan Duncan  MRN:  [id number] Subjective: Megan Duncan continues to be delusional, agitated and combative.  She required as needed medication last night.  She apparently is sundowning.  She is also very paranoid.  After she got the as needed medication she fell asleep a couple hours later and slept well into the morning.  Principal Problem: Altered mental status Diagnosis: Principal Problem:   Altered mental status  Total Time spent with patient: 15 minutes  Past Psychiatric History: Bipolar Disorder on Lithium for years.  Past Medical History:  Past Medical History:  Diagnosis Date   Acute pain of right shoulder 09/24/2018   Acute upper respiratory infection 09/19/2010   Allergic rhinitis 10/03/2016   Anterolisthesis of lumbar spine    Arthritis    hands   Bilateral hip pain 09/02/2014   Bipolar II disorder    Cellulitis and abscess of trunk 09/15/2009   Ceruminosis 03/04/2014   Cervicalgia 06/20/2007   Chronic low back pain 06/20/2007   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-sided sciatica 08/06/2012   COPD (chronic obstructive pulmonary disease) ([place])    Deformity of finger 07/15/2014   Degeneration of lumbar intervertebral disc 08/22/2020   Degenerative lumbar spinal stenosis    Dermatitis 72/53/6644   Diastolic dysfunction    Per pt, diagnosed after [facility]   Diverticulosis    DOE (dyspnea on exertion) 07/20/2016   Essential (primary) hypertension 04/23/2007   Estrogen deficiency 09/01/2017   External hemorrhoid 08/01/2015   Fibromyalgia    Foraminal stenosis of lumbar region 08/22/2020   Generalized anxiety disorder 09/01/2018   Hyperlipidemia    Inflammatory disease of uterus 04/23/2007   Left flank pain 07/13/2021   Left upper quadrant pain 09/01/2017   Lower abdominal pain 08/06/2012   Lumbar back pain with radiculopathy affecting left lower extremity 01/24/2010   Lumbar radiculopathy 01/26/2021   Lump in upper  inner quadrant of left breast 07/13/2021   Major depressive disorder    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   Otitis media 09/05/2010   Palpitations 07/20/2016   Peripheral neuropathy 02/22/2010   Retrolisthesis of vertebrae 08/22/2020   Shingles outbreak 04/14/2015   Sinusitis, acute maxillary 09/14/2013   Stress incontinence, female 08/01/2015   Thoracic aortic atherosclerosis 05/14/2017   Tinea corporis 09/01/2017   Tinnitus 03/04/2014   Torticollis 03/04/2014   Type II diabetes mellitus 06/05/2018    Past Surgical History:  Procedure Laterality Date   APPENDECTOMY     EYE SURGERY     KNEE ARTHROSCOPY Right 11/12/2006   TONSILLECTOMY     TOTAL ABDOMINAL HYSTERECTOMY     Family History:  Family History  Problem Relation Age of Onset   Hypertension Mother    Hiatal hernia Mother    Diabetes Father    Hypertension Father    Heart attack Father    Alcoholism Father    Bipolar disorder Father    Mental illness Sister    Suicidality Sister    Colon cancer Maternal Aunt 80   Breast cancer Maternal Aunt    Bipolar disorder Paternal Aunt     Social History:  Social History   Substance and Sexual Activity  Alcohol Use Yes   Alcohol/week: 0.0 - 1.0 standard drinks   Comment: occasional wine     Social History   Substance and Sexual Activity  Drug Use No    Social History   Socioeconomic History   Marital status: Married  Spouse name: [name]   Number of children: 3   Years of education: 14   Highest education level: Some college, no degree  Occupational History   Occupation: retired  Tobacco Use   Smoking status: Former    Packs/day: 1.50    Years: 40.00    Pack years: 60.00    Types: Cigarettes    Quit date: 07/02/2004    Years since quitting: 17.4    Passive exposure: Past   Smokeless tobacco: Never   Tobacco comments:    Verified by Aldona Lento (Husband), & Lauralee Evener (Daughter)  Vaping Use   Vaping  Use: Never used  Substance and Sexual Activity   Alcohol use: Yes    Alcohol/week: 0.0 - 1.0 standard drinks    Comment: occasional wine   Drug use: No   Sexual activity: Never  Other Topics Concern   Not on file  Social History Narrative   Lives with husband and grandson she is raising.   11/07/21 Lives with husband   Social Determinants of Health   Financial Resource Strain: Medium Risk   Difficulty of Paying Living Expenses: Somewhat hard  Food Insecurity: No Food Insecurity   Worried About Charity fundraiser in the Last Year: Never true   Ran Out of Food in the Last Year: Never true  Transportation Needs: No Transportation Needs   Lack of Transportation (Medical): No   Lack of Transportation (Non-Medical): No  Physical Activity: Inactive   Days of Exercise per Week: 0 days   Minutes of Exercise per Session: 0 min  Stress: No Stress Concern Present   Feeling of Stress : Not at all  Social Connections: Socially Integrated   Frequency of Communication with Friends and Family: More than three times a week   Frequency of Social Gatherings with Friends and Family: More than three times a week   Attends Religious Services: 1 to 4 times per year   Active Member of [employer] or Organizations: Yes   Attends Music therapist: More than 4 times per year   Marital Status: Married   Additional Social History:                         Sleep: Poor  Appetite:  Poor  Current Medications: Current Facility-Administered Medications  Medication Dose Route Frequency Provider Last Rate Last Admin   acetaminophen (TYLENOL) tablet 650 mg  650 mg Oral Q6H PRN Deloria Lair, NP   650 mg at 12/04/21 0418   alum & mag hydroxide-simeth (MAALOX/MYLANTA) 200-200-20 MG/5ML suspension 30 mL  30 mL Oral Q4H PRN Deloria Lair, NP       benztropine (COGENTIN) tablet 0.5 mg  0.5 mg Oral BH-q8a4p Dwanda Tufano Edward, DO       chlorproMAZINE (THORAZINE) injection 50 mg  50 mg  Intramuscular QHS Caroline Sauger, NP   50 mg at 12/05/21 0107   chlorproMAZINE (THORAZINE) tablet 50 mg  50 mg Oral QHS PRN Caroline Sauger, NP       DULoxetine (CYMBALTA) DR capsule 60 mg  60 mg Oral Daily Dixon, Rashaun M, NP   60 mg at 12/04/21 1125   haloperidol (HALDOL) tablet 2 mg  2 mg Oral BH-q8a4p Kynnadi Dicenso Edward, DO       levothyroxine (SYNTHROID) tablet 50 mcg  50 mcg Oral QAC breakfast Anette Riedel M, NP   50 mcg at 12/04/21 1125   LORazepam (ATIVAN) tablet 1 mg  1 mg Oral Q4H PRN Parks Ranger, DO   1 mg at 12/04/21 2119   magnesium hydroxide (MILK OF MAGNESIA) suspension 30 mL  30 mL Oral Daily PRN Deloria Lair, NP       OLANZapine (ZYPREXA) tablet 5 mg  5 mg Oral Q4H PRN Parks Ranger, DO   5 mg at 12/04/21 0418   propranolol (INDERAL) tablet 20 mg  20 mg Oral TID Deloria Lair, NP   20 mg at 12/04/21 2119    Lab Results: No results found for this or any previous visit (from the past 48 hour(s)).  Blood Alcohol level:  Lab Results  Component Value Date   ETH <10 50/53/9767    Metabolic Disorder Labs: Lab Results  Component Value Date   HGBA1C 6.4 10/27/2021   No results found for: PROLACTIN Lab Results  Component Value Date   CHOL 186 10/19/2021   TRIG 140.0 10/19/2021   HDL 70.90 10/19/2021   CHOLHDL 3 10/19/2021   VLDL 28.0 10/19/2021   LDLCALC 87 10/19/2021   LDLCALC 99 09/22/2020    Physical Findings: AIMS:  , ,  ,  ,    CIWA:    COWS:     Musculoskeletal: Strength & Muscle Tone: within normal limits Gait & Station: unsteady Patient leans: N/A  Psychiatric Specialty Exam:  Presentation  General Appearance: Bizarre; Disheveled  Eye Contact:Minimal  Speech:Pressured  Speech Volume:Increased  Handedness:Right   Mood and Affect  Mood:Angry; Depressed; Dysphoric; Hopeless  Affect:Depressed; Tearful; Full Range   Thought Process  Thought Processes:Disorganized; Irrevelant  Descriptions of  Associations:Loose  Orientation:Partial  Thought Content:Delusions; Illogical; Scattered; Paranoid Ideation  History of Schizophrenia/Schizoaffective disorder:No  Duration of Psychotic Symptoms:N/A  Hallucinations:No data recorded Ideas of Reference:Delusions; Paranoia  Suicidal Thoughts:No data recorded Homicidal Thoughts:No data recorded  Sensorium  Memory:Remote Poor  Judgment:Impaired  Insight:None   Executive Functions  Concentration:Poor  Attention Span:Poor  Recall:Poor  Fund of Knowledge:Poor  Language:Poor   Psychomotor Activity  Psychomotor Activity:No data recorded  Assets  Assets:Financial Resources/Insurance; Housing; Intimacy; Social Support   Sleep  Sleep:No data recorded   Physical Exam: Physical Exam Vitals and nursing note reviewed.  Constitutional:      Appearance: Normal appearance. She is normal weight.  Neurological:     General: No focal deficit present.     Mental Status: She is alert and oriented to person, place, and time.  Psychiatric:        Attention and Perception: Attention and perception normal.        Mood and Affect: Mood is anxious. Affect is inappropriate.        Speech: Speech is tangential.        Behavior: Behavior is agitated.        Thought Content: Thought content is paranoid and delusional.        Cognition and Memory: Cognition is impaired. Memory is impaired.        Judgment: Judgment is impulsive and inappropriate.   Review of Systems  Constitutional: Negative.   HENT: Negative.    Eyes: Negative.   Respiratory: Negative.    Cardiovascular: Negative.   Gastrointestinal: Negative.   Genitourinary: Negative.   Musculoskeletal: Negative.   Skin: Negative.   Neurological: Negative.   Endo/Heme/Allergies: Negative.   Psychiatric/Behavioral:  Positive for memory loss. The patient has insomnia.   Blood pressure 118/65, pulse (!) 54, temperature (!) 97.3 F (36.3 C), temperature source Oral, resp. rate  18, height 5\' 2"  (1.575  m), weight 86.2 kg, SpO2 99 %. Body mass index is 34.75 kg/m.   Treatment Plan Summary: Daily contact with patient to assess and evaluate symptoms and progress in treatment, Medication management, and Plan continue with scheduled Thorazine 50 mg at bedtime p.o. or IM.  Increase Haldol to 2 mg twice a day.  Start Cogentin 0.5 mg twice a day.  Continue Cymbalta.  Parks Ranger, DO 12/05/2021, 11:54 AM

## 2021-12-05 NOTE — Progress Notes (Signed)
Patient is still sleeping in room. Respirations remain even and unlabored. Scheduled morning medications held per Md r/t patient sleeping. 1:1 sitter at beside, Q 15 minute safety checks maintained. Will continue to monitor.

## 2021-12-05 NOTE — Progress Notes (Signed)
Recreation Therapy Notes    Date: 12/05/2021  Time: 1:15pm    Location: Craft room      Behavioral response: N/A   Intervention Topic: Wellness    Discussion/Intervention: Patient refused to attend group.   Clinical Observations/Feedback:  Patient refused to attend group.    Deion Swift LRT/CTRS        Michaelyn Wall 12/05/2021 4:03 PM

## 2021-12-05 NOTE — Progress Notes (Signed)
Patient is currently sleeping in her room. Respirations are even and unlabored. No signs of distress noted at this time. Patient continues on 1:1 with sitter in room. Will continue to monitor.

## 2021-12-05 NOTE — Progress Notes (Signed)
Pt wide awake in bed talking incoherently to herself. 1:1 sitter at bedside. Will continue to monitor.

## 2021-12-05 NOTE — Progress Notes (Signed)
Pt offered IM medication for agitation. Pt refused, while still talking incoherently. Security notified, and arrives unit. Pt given IM injection without any issues with staff and security present. Pt laying in bed. Will continue to monitor. 1:1 sitter in place.
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
## 2021-12-05 NOTE — Telephone Encounter (Signed)
[facility] stated they reached out to [facility], and pt is currently in the hospital. At this time they do not feel pt needs physical therapy as situation may be grave. When she is released they will reach out to our office about getting a new referral, Rehab faxed over letter regarding this.

## 2021-12-05 NOTE — Progress Notes (Signed)
Pt fell asleep about 0300 am. 1:1 sitter at bedside. Pt still sleeping. Q15 min safety checks maintained.

## 2021-12-06 ENCOUNTER — Telehealth: Payer: Medicare Other | Admitting: Physician Assistant

## 2021-12-06 DIAGNOSIS — R4182 Altered mental status, unspecified: Secondary | ICD-10-CM | POA: Diagnosis not present

## 2021-12-06 DIAGNOSIS — F315 Bipolar disorder, current episode depressed, severe, with psychotic features: Secondary | ICD-10-CM | POA: Diagnosis not present

## 2021-12-06 MED ORDER — CHLORPROMAZINE HCL 50 MG PO TABS
50.0000 mg | ORAL_TABLET | Freq: Every day | ORAL | Status: DC
  Administered 2021-12-06: 22:00:00 50 mg via ORAL
  Filled 2021-12-06 (×3): qty 1

## 2021-12-06 NOTE — Progress Notes (Signed)
Patient awake, up to geri chair, being assisted with breakfast. 1:1 care continues. Q 15 minute safety checks in place and patient remains safe.

## 2021-12-06 NOTE — Progress Notes (Signed)
Recreation Therapy Notes   Date: 12/06/2021  Time: 10:50   Location: Craft room      Behavioral response: N/A   Intervention Topic: Happiness    Discussion/Intervention: Patient refused to attend group.   Clinical Observations/Feedback:  Patient refused to attend group.    Nolin Grell LRT/CTRS        Kaydyn Sayas 12/06/2021 2:04 PM

## 2021-12-06 NOTE — Progress Notes (Signed)
Pt visible in the dayroom at the beginning of the shift. Pt in her wheelchair with 1:1 sitter at her side. Pt calm. Pt mumbling some incoherent words at times.Hs meds administered.  Pt assisted back to her room via wheelchair and escorted by sitter and tech. Pt assisted to the bathroom and back in bed.  Pt slept well this shift. Wakes up intermittently to mumble and talk in her sleep, and then fall back asleep. Q15 safety checks maintained.

## 2021-12-06 NOTE — Plan of Care (Signed)
Pt remained calm and slept well overnight. 1:1 sitter at bedside.  Problem: Education: Goal: Mental status will improve Outcome: Progressing

## 2021-12-06 NOTE — Progress Notes (Signed)
[facility] MD Progress Note  12/06/2021 2:18 PM Megan Duncan  MRN:  [id number] Subjective:  Megan Duncan continues to be pleasantly confused. She has a long history of bipolar disorder. She took Lithium for years up until it was discontinued on admission. She slept last night after receiving Thorazine injection but she's also had prn ativan and Zyprexa which has made her more sedated than yesterday, but no agitated outbursts so far today. VSS. Slurred speech and difficult to understand. Hopefully she will not require PRN medications tonight and be more clear tomorrow. U/A Final report was no growth day 5 on 12/02/21. CT of Head on 12/01/21 showed generalized atrophy consistent with her age.  Principal Problem: Altered mental status Diagnosis: Principal Problem:   Altered mental status  Total Time spent with patient: 15 minutes  Past Psychiatric History: Bipolar Disorder  Past Medical History:  Past Medical History:  Diagnosis Date   Acute pain of right shoulder 09/24/2018   Acute upper respiratory infection 09/19/2010   Allergic rhinitis 10/03/2016   Anterolisthesis of lumbar spine    Arthritis    hands   Bilateral hip pain 09/02/2014   Bipolar II disorder    Cellulitis and abscess of trunk 09/15/2009   Ceruminosis 03/04/2014   Cervicalgia 06/20/2007   Chronic low back pain 06/20/2007   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-sided sciatica 08/06/2012   COPD (chronic obstructive pulmonary disease) ([place])    Deformity of finger 07/15/2014   Degeneration of lumbar intervertebral disc 08/22/2020   Degenerative lumbar spinal stenosis    Dermatitis 70/96/2836   Diastolic dysfunction    Per pt, diagnosed after [facility]   Diverticulosis    DOE (dyspnea on exertion) 07/20/2016   Essential (primary) hypertension 04/23/2007   Estrogen deficiency 09/01/2017   External hemorrhoid 08/01/2015   Fibromyalgia    Foraminal stenosis of lumbar region 08/22/2020   Generalized anxiety disorder 09/01/2018    Hyperlipidemia    Inflammatory disease of uterus 04/23/2007   Left flank pain 07/13/2021   Left upper quadrant pain 09/01/2017   Lower abdominal pain 08/06/2012   Lumbar back pain with radiculopathy affecting left lower extremity 01/24/2010   Lumbar radiculopathy 01/26/2021   Lump in upper inner quadrant of left breast 07/13/2021   Major depressive disorder    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   Otitis media 09/05/2010   Palpitations 07/20/2016   Peripheral neuropathy 02/22/2010   Retrolisthesis of vertebrae 08/22/2020   Shingles outbreak 04/14/2015   Sinusitis, acute maxillary 09/14/2013   Stress incontinence, female 08/01/2015   Thoracic aortic atherosclerosis 05/14/2017   Tinea corporis 09/01/2017   Tinnitus 03/04/2014   Torticollis 03/04/2014   Type II diabetes mellitus 06/05/2018    Past Surgical History:  Procedure Laterality Date   APPENDECTOMY     EYE SURGERY     KNEE ARTHROSCOPY Right 11/12/2006   TONSILLECTOMY     TOTAL ABDOMINAL HYSTERECTOMY     Family History:  Family History  Problem Relation Age of Onset   Hypertension Mother    Hiatal hernia Mother    Diabetes Father    Hypertension Father    Heart attack Father    Alcoholism Father    Bipolar disorder Father    Mental illness Sister    Suicidality Sister    Colon cancer Maternal Aunt 46   Breast cancer Maternal Aunt    Bipolar disorder Paternal Aunt    Family Psychiatric  History: Yes Social History:  Social History  Substance and Sexual Activity  Alcohol Use Yes   Alcohol/week: 0.0 - 1.0 standard drinks   Comment: occasional wine     Social History   Substance and Sexual Activity  Drug Use No    Social History   Socioeconomic History   Marital status: Married    Spouse name: Nicolemarie Wooley   Number of children: 3   Years of education: 14   Highest education level: Some college, no degree  Occupational History   Occupation: retired  Tobacco Use    Smoking status: Former    Packs/day: 1.50    Years: 40.00    Pack years: 60.00    Types: Cigarettes    Quit date: 07/02/2004    Years since quitting: 17.4    Passive exposure: Past   Smokeless tobacco: Never   Tobacco comments:    Verified by Aldona Lento (Husband), & Lauralee Evener (Daughter)  Vaping Use   Vaping Use: Never used  Substance and Sexual Activity   Alcohol use: Yes    Alcohol/week: 0.0 - 1.0 standard drinks    Comment: occasional wine   Drug use: No   Sexual activity: Never  Other Topics Concern   Not on file  Social History Narrative   Lives with husband and grandson she is raising.   11/07/21 Lives with husband   Social Determinants of Health   Financial Resource Strain: Medium Risk   Difficulty of Paying Living Expenses: Somewhat hard  Food Insecurity: No Food Insecurity   Worried About Charity fundraiser in the Last Year: Never true   Ran Out of Food in the Last Year: Never true  Transportation Needs: No Transportation Needs   Lack of Transportation (Medical): No   Lack of Transportation (Non-Medical): No  Physical Activity: Inactive   Days of Exercise per Week: 0 days   Minutes of Exercise per Session: 0 min  Stress: No Stress Concern Present   Feeling of Stress : Not at all  Social Connections: Socially Integrated   Frequency of Communication with Friends and Family: More than three times a week   Frequency of Social Gatherings with Friends and Family: More than three times a week   Attends Religious Services: 1 to 4 times per year   Active Member of [employer] or Organizations: Yes   Attends Music therapist: More than 4 times per year   Marital Status: Married   Additional Social History:                         Sleep: Poor  Appetite:  Fair  Current Medications: Current Facility-Administered Medications  Medication Dose Route Frequency Provider Last Rate Last Admin   acetaminophen (TYLENOL) tablet 650 mg  650 mg Oral  Q6H PRN Deloria Lair, NP   650 mg at 12/04/21 0418   alum & mag hydroxide-simeth (MAALOX/MYLANTA) 200-200-20 MG/5ML suspension 30 mL  30 mL Oral Q4H PRN Deloria Lair, NP       benztropine (COGENTIN) tablet 0.5 mg  0.5 mg Oral BH-q8a4p Parks Ranger, DO   0.5 mg at 12/06/21 1478   chlorproMAZINE (THORAZINE) tablet 50 mg  50 mg Oral QHS Parks Ranger, DO       DULoxetine (CYMBALTA) DR capsule 60 mg  60 mg Oral Daily Dixon, Rashaun M, NP   60 mg at 12/06/21 1023   haloperidol (HALDOL) tablet 2 mg  2 mg Oral BH-q8a4p Parks Ranger, DO  2 mg at 12/06/21 0835   levothyroxine (SYNTHROID) tablet 50 mcg  50 mcg Oral QAC breakfast Anette Riedel M, NP   50 mcg at 12/06/21 0836   LORazepam (ATIVAN) tablet 1 mg  1 mg Oral Q4H PRN Parks Ranger, DO   1 mg at 12/04/21 2119   magnesium hydroxide (MILK OF MAGNESIA) suspension 30 mL  30 mL Oral Daily PRN Deloria Lair, NP       OLANZapine (ZYPREXA) tablet 5 mg  5 mg Oral Q4H PRN Parks Ranger, DO   5 mg at 12/05/21 2108   propranolol (INDERAL) tablet 20 mg  20 mg Oral TID Deloria Lair, NP   20 mg at 12/06/21 1023    Lab Results: No results found for this or any previous visit (from the past 48 hour(s)).  Blood Alcohol level:  Lab Results  Component Value Date   ETH <10 90/24/0973    Metabolic Disorder Labs: Lab Results  Component Value Date   HGBA1C 6.4 10/27/2021   No results found for: PROLACTIN Lab Results  Component Value Date   CHOL 186 10/19/2021   TRIG 140.0 10/19/2021   HDL 70.90 10/19/2021   CHOLHDL 3 10/19/2021   VLDL 28.0 10/19/2021   LDLCALC 87 10/19/2021   LDLCALC 99 09/22/2020    Physical Findings: AIMS:  , ,  ,  ,    CIWA:    COWS:     Musculoskeletal: Strength & Muscle Tone: within normal limits Gait & Station: unable to stand Patient leans: N/A  Psychiatric Specialty Exam:  Presentation  General Appearance: Bizarre; Disheveled  Eye  Contact:Minimal  Speech:Pressured  Speech Volume:Increased  Handedness:Right   Mood and Affect  Mood:Angry; Depressed; Dysphoric; Hopeless  Affect:Depressed; Tearful; Full Range   Thought Process  Thought Processes:Disorganized; Irrevelant  Descriptions of Associations:Loose  Orientation:Partial  Thought Content:Delusions; Illogical; Scattered; Paranoid Ideation  History of Schizophrenia/Schizoaffective disorder:No  Duration of Psychotic Symptoms:N/A  Hallucinations:No data recorded Ideas of Reference:Delusions; Paranoia  Suicidal Thoughts:No data recorded Homicidal Thoughts:No data recorded  Sensorium  Memory:Remote Poor  Judgment:Impaired  Insight:None   Executive Functions  Concentration:Poor  Attention Span:Poor  Recall:Poor  Fund of Knowledge:Poor  Language:Poor   Psychomotor Activity  Psychomotor Activity:No data recorded  Assets  Assets:Financial Resources/Insurance; Housing; Intimacy; Social Support   Sleep  Sleep:No data recorded   Physical Exam: Physical Exam Vitals and nursing note reviewed.  Constitutional:      Appearance: Normal appearance. She is normal weight.  Neurological:     General: No focal deficit present.     Mental Status: She is alert and oriented to person, place, and time.  Psychiatric:        Attention and Perception: She is inattentive.        Mood and Affect: Mood normal. Affect is inappropriate.        Speech: She is noncommunicative. Speech is slurred.        Behavior: Behavior is agitated and slowed.        Thought Content: Thought content normal.        Cognition and Memory: Cognition is impaired. Memory is impaired.        Judgment: Judgment is inappropriate.   Review of Systems  Constitutional: Negative.   HENT: Negative.    Eyes: Negative.   Respiratory: Negative.    Cardiovascular: Negative.   Gastrointestinal: Negative.   Genitourinary: Negative.   Musculoskeletal: Negative.   Skin:  Negative.   Neurological: Negative.   Endo/Heme/Allergies:  Negative.   Psychiatric/Behavioral:  Positive for memory loss.   Blood pressure (!) 133/55, pulse 72, temperature (!) 97.4 F (36.3 C), temperature source Oral, resp. rate 18, height 5\' 2"  (1.575 m), weight 86.2 kg, SpO2 97 %. Body mass index is 34.75 kg/m.   Treatment Plan Summary: Daily contact with patient to assess and evaluate symptoms and progress in treatment, Medication management, and Plan Schedule p.o Thorazine 50mg  HS. Continue 1:1.  Parks Ranger, DO 12/06/2021, 2:18 PM

## 2021-12-06 NOTE — Progress Notes (Signed)
Recreation Therapy Notes  INPATIENT RECREATION TR PLAN  Patient Details Name: LUCRETIA PENDLEY MRN: [id number] DOB: 1946-09-06 Today's Date: 12/06/2021  Rec Therapy Plan Is patient appropriate for Therapeutic Recreation?: Yes Treatment times per week: at least 3 Estimated Length of Stay: 5-7 days TR Treatment/Interventions: Group participation (Comment)  Discharge Criteria Treatment plan/goals/alternatives discussed and agreed upon by:: Patient/family  Discharge Summary     Almeta Geisel 12/06/2021, 4:03 PM

## 2021-12-06 NOTE — Progress Notes (Signed)
Recreation Therapy Notes  INPATIENT RECREATION THERAPY ASSESSMENT  Patient Details Name: Megan Duncan MRN: [id number] DOB: Sep 01, 1946 Today's Date: 12/06/2021       Information Obtained From: Chart Review  Able to Participate in Assessment/Interview: No  Patient Presentation: Confused  Reason for Admission (Per Patient): Active Symptoms  Patient Stressors:    Coping Skills:   Read  Leisure Interests (2+):   ([employer])  Frequency of Recreation/Participation: Monthly  Awareness of Community Resources:  Yes  Community Resources:  Other (Comment) ([facility])  Current Use: Yes  If no, Barriers?:    Expressed Interest in [employer] Information:    [place] of Residence:  [place]  Patient Main Form of Transportation: Musician  Patient Strengths:  Great personality  Patient Identified Areas of Improvement:  N/A  Patient Goal for Hospitalization:  Calm down and handle myself in proper etiquette way  Current SI (including self-harm):  No  Current HI:  No  Current AVH: No  Staff Intervention Plan: Group Attendance, Collaborate with Interdisciplinary Treatment Team  Consent to Intern Participation: N/A  Megan Duncan 12/06/2021, 4:03 PM

## 2021-12-06 NOTE — Telephone Encounter (Signed)
Noted  

## 2021-12-06 NOTE — Progress Notes (Signed)
Pt sleeping soundly. Sitter at bedside. Q15 mins safety checks maintained. Will continue to monitor.

## 2021-12-06 NOTE — Plan of Care (Signed)
Patient alert with some confusion. Patient denies any pain , SI,HI,AVH. Patient denies anxiety and depression at this time. She is pleasant and calm. Patient ate breakfast in the day room and consumed about 80%. Morning medications given whole and patient tolerated well. Patient currently in dayroom with 1:1 sitter. Patient encouraged to drink fluids. Emotional support and re-enforcement provided. Q 15 minutes safety checks maintained.          Problem: Education: Goal: Knowledge of [place] [facility] Education information/materials will improve 12/06/2021 1319 by Nolon Bussing, RN Outcome: Progressing 12/06/2021 1319 by Nolon Bussing, RN Outcome: Progressing Goal: Emotional status will improve 12/06/2021 1319 by Nolon Bussing, RN Outcome: Progressing 12/06/2021 1319 by Nolon Bussing, RN Outcome: Progressing Goal: Mental status will improve 12/06/2021 1319 by Nolon Bussing, RN Outcome: Progressing 12/06/2021 1319 by Nolon Bussing, RN Outcome: Progressing Goal: Verbalization of understanding the information provided will improve 12/06/2021 1319 by Nolon Bussing, RN Outcome: Progressing 12/06/2021 1319 by Nolon Bussing, RN Outcome: Progressing   Problem: Activity: Goal: Interest or engagement in activities will improve 12/06/2021 1319 by Nolon Bussing, RN Outcome: Progressing 12/06/2021 1319 by Nolon Bussing, RN Outcome: Progressing Goal: Sleeping patterns will improve 12/06/2021 1319 by Nolon Bussing, RN Outcome: Progressing 12/06/2021 1319 by Nolon Bussing, RN Outcome: Progressing   Problem: Coping: Goal: Ability to verbalize frustrations and anger appropriately will improve 12/06/2021 1319 by Nolon Bussing, RN Outcome: Progressing 12/06/2021 1319 by Nolon Bussing, RN Outcome: Progressing Goal: Ability to demonstrate self-control will improve 12/06/2021 1319 by Nolon Bussing, RN Outcome: Progressing 12/06/2021  1319 by Nolon Bussing, RN Outcome: Progressing   Problem: Health Behavior/Discharge Planning: Goal: Identification of resources available to assist in meeting health care needs will improve 12/06/2021 1319 by Nolon Bussing, RN Outcome: Progressing 12/06/2021 1319 by Nolon Bussing, RN Outcome: Progressing Goal: Compliance with treatment plan for underlying cause of condition will improve 12/06/2021 1319 by Nolon Bussing, RN Outcome: Progressing 12/06/2021 1319 by Nolon Bussing, RN Outcome: Progressing   Problem: Physical Regulation: Goal: Ability to maintain clinical measurements within normal limits will improve 12/06/2021 1319 by Nolon Bussing, RN Outcome: Progressing 12/06/2021 1319 by Nolon Bussing, RN Outcome: Progressing   Problem: Safety: Goal: Periods of time without injury will increase 12/06/2021 1319 by Nolon Bussing, RN Outcome: Progressing 12/06/2021 1319 by Nolon Bussing, RN Outcome: Progressing   Problem: Education: Goal: Utilization of techniques to improve thought processes will improve 12/06/2021 1319 by Nolon Bussing, RN Outcome: Progressing 12/06/2021 1319 by Nolon Bussing, RN Outcome: Progressing Goal: Knowledge of the prescribed therapeutic regimen will improve 12/06/2021 1319 by Nolon Bussing, RN Outcome: Progressing 12/06/2021 1319 by Nolon Bussing, RN Outcome: Progressing   Problem: Activity: Goal: Interest or engagement in leisure activities will improve 12/06/2021 1319 by Nolon Bussing, RN Outcome: Progressing 12/06/2021 1319 by Nolon Bussing, RN Outcome: Progressing Goal: Imbalance in normal sleep/wake cycle will improve 12/06/2021 1319 by Nolon Bussing, RN Outcome: Progressing 12/06/2021 1319 by Nolon Bussing, RN Outcome: Progressing   Problem: Coping: Goal: Coping ability will improve 12/06/2021 1319 by Nolon Bussing, RN Outcome: Progressing 12/06/2021 1319 by  Nolon Bussing, RN Outcome: Progressing Goal: Will verbalize feelings 12/06/2021 1319 by Nolon Bussing, RN Outcome: Progressing 12/06/2021 1319 by Nolon Bussing, RN Outcome: Progressing   Problem: Health Behavior/Discharge Planning: Goal: Ability to make decisions will improve  12/06/2021 1319 by Nolon Bussing, RN Outcome: Progressing 12/06/2021 1319 by Nolon Bussing, RN Outcome: Progressing Goal: Compliance with therapeutic regimen will improve Outcome: Progressing   Problem: Role Relationship: Goal: Will demonstrate positive changes in social behaviors and relationships Outcome: Progressing   Problem: Safety: Goal: Ability to disclose and discuss suicidal ideas will improve Outcome: Progressing Goal: Ability to identify and utilize support systems that promote safety will improve Outcome: Progressing   Problem: Self-Concept: Goal: Will verbalize positive feelings about self Outcome: Progressing Goal: Level of anxiety will decrease Outcome: Progressing

## 2021-12-07 ENCOUNTER — Telehealth: Payer: Self-pay

## 2021-12-07 ENCOUNTER — Other Ambulatory Visit: Payer: Self-pay | Admitting: Physician Assistant

## 2021-12-07 DIAGNOSIS — R4182 Altered mental status, unspecified: Secondary | ICD-10-CM | POA: Diagnosis not present

## 2021-12-07 DIAGNOSIS — F315 Bipolar disorder, current episode depressed, severe, with psychotic features: Secondary | ICD-10-CM | POA: Diagnosis not present

## 2021-12-07 MED ORDER — HALOPERIDOL 1 MG PO TABS
1.0000 mg | ORAL_TABLET | ORAL | Status: DC
  Administered 2021-12-07 – 2021-12-22 (×30): 1 mg via ORAL
  Filled 2021-12-07 (×31): qty 1

## 2021-12-07 MED ORDER — DONEPEZIL HCL 5 MG PO TABS
5.0000 mg | ORAL_TABLET | Freq: Every day | ORAL | Status: DC
  Administered 2021-12-07 – 2021-12-21 (×14): 5 mg via ORAL
  Filled 2021-12-07 (×16): qty 1

## 2021-12-07 MED ORDER — CHLORPROMAZINE HCL 25 MG PO TABS
25.0000 mg | ORAL_TABLET | Freq: Every day | ORAL | Status: DC
  Administered 2021-12-07 – 2021-12-13 (×6): 25 mg via ORAL
  Filled 2021-12-07 (×8): qty 1

## 2021-12-07 NOTE — Plan of Care (Signed)
°  Problem: Education: Goal: Knowledge of [place] [facility] Education information/materials will improve Outcome: Not Progressing Goal: Emotional status will improve Outcome: Not Progressing Goal: Mental status will improve Outcome: Not Progressing Goal: Verbalization of understanding the information provided will improve Outcome: Not Progressing   Problem: Activity: Goal: Interest or engagement in activities will improve Outcome: Not Progressing Goal: Sleeping patterns will improve Outcome: Progressing   Problem: Coping: Goal: Ability to verbalize frustrations and anger appropriately will improve Outcome: Not Progressing

## 2021-12-07 NOTE — Telephone Encounter (Signed)
° °  Telephone encounter was:  Successful.  12/07/2021 Name: Megan Duncan MRN: [id number] DOB: February 03, 1946  Megan Duncan is a 76 y.o. year old female who is a primary care patient of Ann Held, DO . The community resource team was consulted for assistance with  caregiver resources.  Care guide performed the following interventions: Left message for Genevie Ann at [facility] to find out the status of [place] referral placed 11/27/21.  Gave daughter's number as contact in referral. Patient is currently in-patient.  Follow Up Plan:  Care guide will follow up with patient by phone over the next 2-90 days  Donjuan Robison, AAS Paralegal, [facility]  [address], [place] [address] ??[email or web address]   ?? [phone]   [email or web address]

## 2021-12-07 NOTE — Progress Notes (Signed)
Patient continues with 1:1 for safety. Maximum assistance needed with transfers and adl care, unable to ambulate. Staff uses wheelchair for transportation on unit. Confusion and slurred words noted. Daughter in for visit this shift. Alert to person only. Medications given in applesauce. Encouragement and support provided. Safety checks maintained. Medications given as prescribed. Pt remains safe on unit with sitter for safety and 15 min safety checks.

## 2021-12-07 NOTE — Progress Notes (Signed)
Patient rested well through out most of the night. She needed constant encouragement to take her medications but she was compliant compliant. She remains disorganized in her thought process when engaged in conversation.

## 2021-12-07 NOTE — Telephone Encounter (Signed)
° °  Telephone encounter was:  Successful.  12/07/2021 Name: Megan Duncan MRN: [id number] DOB: [birth year]  [name] is a 76 y.o. year old female who is a primary care patient of Ann Held, DO . The community resource team was consulted for assistance with Spoke with Trenton Gammon at [employer] she referred patient's family to [employer] [facility] [place] [facility].  I also spoke with the patient's daughter Pincus Large on 11/27/21 and gave her the contact information for [employer] [facility] [place] [facility].   Care guide performed the following interventions: Follow up call placed to the patient to discuss status of referral.  Follow Up Plan:  No further follow up planned at this time. The patient has been provided with needed resources.  Hillary Struss, AAS Paralegal, [facility]  [address], [place] [address] ??[email or web address]   ?? [phone]   [email or web address]

## 2021-12-07 NOTE — Plan of Care (Signed)
Patient alert with confusion. Oriented to self only. Patient c/o back pain- PRN Tylenol administered. She denies SI,HI,AVH. Patient denies anxiety and depression at this time. She is pleasant and calm. Patient ate breakfast in the day room with assistance- appetite fair. Morning medications given whole in applesauce and patient tolerated well. Patient currently in dayroom with 1:1 sitter. Patient encouraged to drink fluids. Emotional support and re-enforcement provided. Q 15 minutes safety checks maintained.   Problem: Education: Goal: Knowledge of [place] General Education information/materials will improve Outcome: Progressing Goal: Emotional status will improve Outcome: Progressing Goal: Mental status will improve Outcome: Progressing Goal: Verbalization of understanding the information provided will improve Outcome: Progressing   Problem: Activity: Goal: Interest or engagement in activities will improve Outcome: Progressing Goal: Sleeping patterns will improve Outcome: Progressing   Problem: Coping: Goal: Ability to verbalize frustrations and anger appropriately will improve Outcome: Progressing Goal: Ability to demonstrate self-control will improve Outcome: Progressing   Problem: Health Behavior/Discharge Planning: Goal: Identification of resources available to assist in meeting health care needs will improve Outcome: Progressing Goal: Compliance with treatment plan for underlying cause of condition will improve Outcome: Progressing   Problem: Physical Regulation: Goal: Ability to maintain clinical measurements within normal limits will improve Outcome: Progressing   Problem: Safety: Goal: Periods of time without injury will increase Outcome: Progressing   Problem: Education: Goal: Utilization of techniques to improve thought processes will improve Outcome: Progressing Goal: Knowledge of the prescribed therapeutic regimen will improve Outcome: Progressing    Problem: Activity: Goal: Interest or engagement in leisure activities will improve Outcome: Progressing Goal: Imbalance in normal sleep/wake cycle will improve Outcome: Progressing   Problem: Coping: Goal: Coping ability will improve Outcome: Progressing Goal: Will verbalize feelings Outcome: Progressing   Problem: Health Behavior/Discharge Planning: Goal: Ability to make decisions will improve Outcome: Progressing Goal: Compliance with therapeutic regimen will improve Outcome: Progressing   Problem: Role Relationship: Goal: Will demonstrate positive changes in social behaviors and relationships Outcome: Progressing   Problem: Safety: Goal: Ability to disclose and discuss suicidal ideas will improve Outcome: Progressing Goal: Ability to identify and utilize support systems that promote safety will improve Outcome: Progressing   Problem: Self-Concept: Goal: Will verbalize positive feelings about self Outcome: Progressing Goal: Level of anxiety will decrease Outcome: Progressing

## 2021-12-07 NOTE — Progress Notes (Signed)
Recreation Therapy Notes   Date: 12/07/2021  Time: 1:15pm    Location: Craft room      Behavioral response: N/A   Intervention Topic: Relaxation   Discussion/Intervention: Patient refused to attend group.   Clinical Observations/Feedback:  Patient refused to attend group.    Nikea Settle LRT/CTRS         [employer] 12/07/2021 3:25 PM

## 2021-12-07 NOTE — Progress Notes (Signed)
Patient alert and awake with 1:1 next to her. No complaints from patient. She remains safe with Q 15 minute checks in place.

## 2021-12-07 NOTE — Progress Notes (Signed)
Patient is resting calmly in day room with sitter by her side. 1:1 continues. She remains safe with Q 15 minute safety checks in place.

## 2021-12-07 NOTE — Progress Notes (Signed)
[facility] MD Progress Note  12/07/2021 11:32 AM Megan Duncan  MRN:  [id number] Subjective: Earlie Server did sleep last night.  She is still very confused and slurs her words.  I spoke with her daughter yesterday who told me that she had a drastic change in her mood and attitude and became angry and did not sleep for days on end.  She says that her doctor had diagnosed her with dementia that seem to be worsening pretty rapidly.  I do not see any evidence of parkinsonism at this time and she has reacted positively as far as behavior to antipsychotics but I will go ahead and decrease them.  She does have as needed medications.  She obviously has dry mouth, also.  Principal Problem: Altered mental status Diagnosis: Principal Problem:   Altered mental status  Total Time spent with patient: 15 minutes  Past Psychiatric History: Bipolar Disorder  Past Medical History:  Past Medical History:  Diagnosis Date   Acute pain of right shoulder 09/24/2018   Acute upper respiratory infection 09/19/2010   Allergic rhinitis 10/03/2016   Anterolisthesis of lumbar spine    Arthritis    hands   Bilateral hip pain 09/02/2014   Bipolar II disorder    Cellulitis and abscess of trunk 09/15/2009   Ceruminosis 03/04/2014   Cervicalgia 06/20/2007   Chronic low back pain 06/20/2007   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-sided sciatica 08/06/2012   COPD (chronic obstructive pulmonary disease) ([place])    Deformity of finger 07/15/2014   Degeneration of lumbar intervertebral disc 08/22/2020   Degenerative lumbar spinal stenosis    Dermatitis 84/16/6063   Diastolic dysfunction    Per pt, diagnosed after [facility]   Diverticulosis    DOE (dyspnea on exertion) 07/20/2016   Essential (primary) hypertension 04/23/2007   Estrogen deficiency 09/01/2017   External hemorrhoid 08/01/2015   Fibromyalgia    Foraminal stenosis of lumbar region 08/22/2020   Generalized anxiety disorder 09/01/2018   Hyperlipidemia     Inflammatory disease of uterus 04/23/2007   Left flank pain 07/13/2021   Left upper quadrant pain 09/01/2017   Lower abdominal pain 08/06/2012   Lumbar back pain with radiculopathy affecting left lower extremity 01/24/2010   Lumbar radiculopathy 01/26/2021   Lump in upper inner quadrant of left breast 07/13/2021   Major depressive disorder    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   Otitis media 09/05/2010   Palpitations 07/20/2016   Peripheral neuropathy 02/22/2010   Retrolisthesis of vertebrae 08/22/2020   Shingles outbreak 04/14/2015   Sinusitis, acute maxillary 09/14/2013   Stress incontinence, female 08/01/2015   Thoracic aortic atherosclerosis 05/14/2017   Tinea corporis 09/01/2017   Tinnitus 03/04/2014   Torticollis 03/04/2014   Type II diabetes mellitus 06/05/2018    Past Surgical History:  Procedure Laterality Date   APPENDECTOMY     EYE SURGERY     KNEE ARTHROSCOPY Right 11/12/2006   TONSILLECTOMY     TOTAL ABDOMINAL HYSTERECTOMY     Family History:  Family History  Problem Relation Age of Onset   Hypertension Mother    Hiatal hernia Mother    Diabetes Father    Hypertension Father    Heart attack Father    Alcoholism Father    Bipolar disorder Father    Mental illness Sister    Suicidality Sister    Colon cancer Maternal Aunt 91   Breast cancer Maternal Aunt    Bipolar disorder Paternal Aunt    Family Psychiatric  History: Sister committed suicide Social History:  Social History   Substance and Sexual Activity  Alcohol Use Yes   Alcohol/week: 0.0 - 1.0 standard drinks   Comment: occasional wine     Social History   Substance and Sexual Activity  Drug Use No    Social History   Socioeconomic History   Marital status: Married    Spouse name: Shadi Larner   Number of children: 3   Years of education: 14   Highest education level: Some college, no degree  Occupational History   Occupation: retired  Tobacco Use    Smoking status: Former    Packs/day: 1.50    Years: 40.00    Pack years: 60.00    Types: Cigarettes    Quit date: 07/02/2004    Years since quitting: 17.4    Passive exposure: Past   Smokeless tobacco: Never   Tobacco comments:    Verified by Aldona Lento (Husband), & Lauralee Evener (Daughter)  Vaping Use   Vaping Use: Never used  Substance and Sexual Activity   Alcohol use: Yes    Alcohol/week: 0.0 - 1.0 standard drinks    Comment: occasional wine   Drug use: No   Sexual activity: Never  Other Topics Concern   Not on file  Social History Narrative   Lives with husband and grandson she is raising.   11/07/21 Lives with husband   Social Determinants of Health   Financial Resource Strain: Medium Risk   Difficulty of Paying Living Expenses: Somewhat hard  Food Insecurity: No Food Insecurity   Worried About Charity fundraiser in the Last Year: Never true   Ran Out of Food in the Last Year: Never true  Transportation Needs: No Transportation Needs   Lack of Transportation (Medical): No   Lack of Transportation (Non-Medical): No  Physical Activity: Inactive   Days of Exercise per Week: 0 days   Minutes of Exercise per Session: 0 min  Stress: No Stress Concern Present   Feeling of Stress : Not at all  Social Connections: Socially Integrated   Frequency of Communication with Friends and Family: More than three times a week   Frequency of Social Gatherings with Friends and Family: More than three times a week   Attends Religious Services: 1 to 4 times per year   Active Member of [employer] or Organizations: Yes   Attends Music therapist: More than 4 times per year   Marital Status: Married   Additional Social History:                         Sleep: Fair  Appetite:  Fair  Current Medications: Current Facility-Administered Medications  Medication Dose Route Frequency Provider Last Rate Last Admin   acetaminophen (TYLENOL) tablet 650 mg  650 mg Oral  Q6H PRN Deloria Lair, NP   650 mg at 12/07/21 0817   alum & mag hydroxide-simeth (MAALOX/MYLANTA) 200-200-20 MG/5ML suspension 30 mL  30 mL Oral Q4H PRN Deloria Lair, NP       chlorproMAZINE (THORAZINE) tablet 25 mg  25 mg Oral QHS Miryah Ralls Edward, DO       donepezil (ARICEPT) tablet 5 mg  5 mg Oral QHS Clearnce Leja Edward, DO       DULoxetine (CYMBALTA) DR capsule 60 mg  60 mg Oral Daily Dixon, Rashaun M, NP   60 mg at 12/07/21 1029   haloperidol (HALDOL) tablet 1 mg  1  mg Oral BH-[id number] Parks Ranger, DO       levothyroxine (SYNTHROID) tablet 50 mcg  50 mcg Oral QAC breakfast Anette Riedel M, NP   50 mcg at 12/07/21 2376   LORazepam (ATIVAN) tablet 1 mg  1 mg Oral Q4H PRN Parks Ranger, DO   1 mg at 12/04/21 2119   magnesium hydroxide (MILK OF MAGNESIA) suspension 30 mL  30 mL Oral Daily PRN Deloria Lair, NP       OLANZapine (ZYPREXA) tablet 5 mg  5 mg Oral Q4H PRN Parks Ranger, DO   5 mg at 12/05/21 2108   propranolol (INDERAL) tablet 20 mg  20 mg Oral TID Deloria Lair, NP   20 mg at 12/07/21 1029    Lab Results: No results found for this or any previous visit (from the past 48 hour(s)).  Blood Alcohol level:  Lab Results  Component Value Date   ETH <10 28/31/5176    Metabolic Disorder Labs: Lab Results  Component Value Date   HGBA1C 6.4 10/27/2021   No results found for: PROLACTIN Lab Results  Component Value Date   CHOL 186 10/19/2021   TRIG 140.0 10/19/2021   HDL 70.90 10/19/2021   CHOLHDL 3 10/19/2021   VLDL 28.0 10/19/2021   LDLCALC 87 10/19/2021   LDLCALC 99 09/22/2020    Physical Findings: AIMS:  , ,  ,  ,    CIWA:    COWS:     Musculoskeletal: Strength & Muscle Tone: within normal limits Gait & Station: unable to stand Patient leans: N/A  Psychiatric Specialty Exam:  Presentation  General Appearance: Bizarre; Disheveled  Eye Contact:Minimal  Speech:Pressured  Speech  Volume:Increased  Handedness:Right   Mood and Affect  Mood:Angry; Depressed; Dysphoric; Hopeless  Affect:Depressed; Tearful; Full Range   Thought Process  Thought Processes:Disorganized; Irrevelant  Descriptions of Associations:Loose  Orientation:Partial  Thought Content:Delusions; Illogical; Scattered; Paranoid Ideation  History of Schizophrenia/Schizoaffective disorder:No  Duration of Psychotic Symptoms:N/A  Hallucinations:No data recorded Ideas of Reference:Delusions; Paranoia  Suicidal Thoughts:No data recorded Homicidal Thoughts:No data recorded  Sensorium  Memory:Remote Poor  Judgment:Impaired  Insight:None   Executive Functions  Concentration:Poor  Attention Span:Poor  Recall:Poor  Fund of Knowledge:Poor  Language:Poor   Psychomotor Activity  Psychomotor Activity:No data recorded  Assets  Assets:Financial Resources/Insurance; Housing; Intimacy; Social Support   Sleep  Sleep:No data recorded   Physical Exam: Physical Exam Vitals and nursing note reviewed.  Constitutional:      Appearance: Normal appearance. She is normal weight.  Neurological:     General: No focal deficit present.     Mental Status: She is alert and oriented to person, place, and time.  Psychiatric:        Attention and Perception: She is inattentive.        Mood and Affect: Mood and affect normal.        Speech: She is noncommunicative. Speech is slurred and tangential.        Behavior: Behavior is agitated.        Cognition and Memory: Cognition is impaired. Memory is impaired.        Judgment: Judgment is inappropriate.   Review of Systems  Constitutional: Negative.   HENT: Negative.    Eyes: Negative.   Respiratory: Negative.    Cardiovascular: Negative.   Gastrointestinal: Negative.   Genitourinary: Negative.   Musculoskeletal: Negative.   Skin: Negative.   Neurological: Negative.   Endo/Heme/Allergies: Negative.   Psychiatric/Behavioral:  Positive  for  memory loss. The patient has insomnia.   Blood pressure 124/84, pulse (!) 54, temperature 98.1 F (36.7 C), resp. rate 18, height 5\' 2"  (1.575 m), weight 86.2 kg, SpO2 97 %. Body mass index is 34.75 kg/m.   Treatment Plan Summary: Daily contact with patient to assess and evaluate symptoms and progress in treatment, Medication management, and Plan decrease Haldol to 1 mg twice a day.  Discontinue Cogentin.  Decrease Thorazine to 25 mg at bedtime.  Parks Ranger, DO 12/07/2021, 11:32 AM

## 2021-12-07 NOTE — Progress Notes (Signed)
Patient awake, up to geri chair, being assisted with breakfast. 1:1 care continues. Q 15 minute safety checks in place and patient remains safe.

## 2021-12-08 DIAGNOSIS — F315 Bipolar disorder, current episode depressed, severe, with psychotic features: Secondary | ICD-10-CM | POA: Diagnosis not present

## 2021-12-08 DIAGNOSIS — R4182 Altered mental status, unspecified: Secondary | ICD-10-CM | POA: Diagnosis not present

## 2021-12-08 NOTE — Progress Notes (Signed)
Patient awake, up to geri chair, assisted with breakfast. !:1 safety monitoring continue. Remain safe on the unit with q15 minute safety check.

## 2021-12-08 NOTE — Progress Notes (Signed)
Patient remains alert with confusion. Continue on 1:1 safety monitoring. Patient is currently sitting in geri chair in the day room in no apparent distress. Remain safe on the unit with Q 15 minute safety check.

## 2021-12-08 NOTE — Plan of Care (Signed)
Patient remain alert to self with confusion. Remain calm and pleasant. During assessment patient observed with slurred, incomprehensible speech.  Ate breakfast in the day room with the assistance of staff. Assisted to toilet with two physical assist. Remain on 1:1 safety monitoring. Patient is currently sitting in geri chair in the day room among staff and peers. Q15 minute safety checks maintained.  Problem: Education: Goal: Knowledge of [place] [facility] information/materials will improve Outcome: Progressing   Problem: Education: Goal: Emotional status will improve Outcome: Progressing   Problem: Education: Goal: Mental status will improve Outcome: Progressing   Problem: Education: Goal: Verbalization of understanding the information provided will improve Outcome: Progressing   Problem: Activity: Goal: Interest or engagement in activities will improve Outcome: Progressing

## 2021-12-08 NOTE — Progress Notes (Signed)
Recreation Therapy Notes  Date: 12/08/2021  Time: 1:15pm    Location: Courtyard       Behavioral response: N/A   Intervention Topic: Social-Skills   Discussion/Intervention: Patient refused to attend group.   Clinical Observations/Feedback:  Patient refused to attend group.    Megan Duncan LRT/CTRS        [name] 12/08/2021 3:15 PM

## 2021-12-08 NOTE — Progress Notes (Signed)
[facility] MD Progress Note  12/08/2021 11:07 AM Megan Duncan  MRN:  [id number] Subjective: Megan Duncan remains confused and slurring her speech.  She is more alert today.  I decreased her medications yesterday.  She did sleep last night even with the lower dose of Thorazine.  She does not remember her daughter coming in to visit.  She is unable to communicate effectively.  She was able to feed herself this morning.  She did require some assistance.  There is no focal deficits or EPS or TD.  There are no side effects from the medications.  She seems to be tolerating them well.  Her vital signs are stable. Her behavior has improved but her cognition has not.  More time is needed before any medication changes.  Principal Problem: Altered mental status Diagnosis: Principal Problem:   Altered mental status  Total Time spent with patient: 15 minutes  Past Psychiatric History: On Lithium for years  Past Medical History:  Past Medical History:  Diagnosis Date   Acute pain of right shoulder 09/24/2018   Acute upper respiratory infection 09/19/2010   Allergic rhinitis 10/03/2016   Anterolisthesis of lumbar spine    Arthritis    hands   Bilateral hip pain 09/02/2014   Bipolar II disorder    Cellulitis and abscess of trunk 09/15/2009   Ceruminosis 03/04/2014   Cervicalgia 06/20/2007   Chronic low back pain 06/20/2007   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-sided sciatica 08/06/2012   COPD (chronic obstructive pulmonary disease) ([place])    Deformity of finger 07/15/2014   Degeneration of lumbar intervertebral disc 08/22/2020   Degenerative lumbar spinal stenosis    Dermatitis 64/40/3474   Diastolic dysfunction    Per pt, diagnosed after [facility]   Diverticulosis    DOE (dyspnea on exertion) 07/20/2016   Essential (primary) hypertension 04/23/2007   Estrogen deficiency 09/01/2017   External hemorrhoid 08/01/2015   Fibromyalgia    Foraminal stenosis of lumbar region 08/22/2020   Generalized  anxiety disorder 09/01/2018   Hyperlipidemia    Inflammatory disease of uterus 04/23/2007   Left flank pain 07/13/2021   Left upper quadrant pain 09/01/2017   Lower abdominal pain 08/06/2012   Lumbar back pain with radiculopathy affecting left lower extremity 01/24/2010   Lumbar radiculopathy 01/26/2021   Lump in upper inner quadrant of left breast 07/13/2021   Major depressive disorder    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   Otitis media 09/05/2010   Palpitations 07/20/2016   Peripheral neuropathy 02/22/2010   Retrolisthesis of vertebrae 08/22/2020   Shingles outbreak 04/14/2015   Sinusitis, acute maxillary 09/14/2013   Stress incontinence, female 08/01/2015   Thoracic aortic atherosclerosis 05/14/2017   Tinea corporis 09/01/2017   Tinnitus 03/04/2014   Torticollis 03/04/2014   Type II diabetes mellitus 06/05/2018    Past Surgical History:  Procedure Laterality Date   APPENDECTOMY     EYE SURGERY     KNEE ARTHROSCOPY Right 11/12/2006   TONSILLECTOMY     TOTAL ABDOMINAL HYSTERECTOMY     Family History:  Family History  Problem Relation Age of Onset   Hypertension Mother    Hiatal hernia Mother    Diabetes Father    Hypertension Father    Heart attack Father    Alcoholism Father    Bipolar disorder Father    Mental illness Sister    Suicidality Sister    Colon cancer Maternal Aunt 80   Breast cancer Maternal Aunt    Bipolar  disorder Paternal Aunt    Family Psychiatric  History: Sister committed suicide. Social History:  Social History   Substance and Sexual Activity  Alcohol Use Yes   Alcohol/week: 0.0 - 1.0 standard drinks   Comment: occasional wine     Social History   Substance and Sexual Activity  Drug Use No    Social History   Socioeconomic History   Marital status: Married    Spouse name: Bridey Brookover   Number of children: 3   Years of education: 14   Highest education level: Some college, no degree   Occupational History   Occupation: retired  Tobacco Use   Smoking status: Former    Packs/day: 1.50    Years: 40.00    Pack years: 60.00    Types: Cigarettes    Quit date: 07/02/2004    Years since quitting: 17.4    Passive exposure: Past   Smokeless tobacco: Never   Tobacco comments:    Verified by Aldona Lento (Husband), & Lauralee Evener (Daughter)  Vaping Use   Vaping Use: Never used  Substance and Sexual Activity   Alcohol use: Yes    Alcohol/week: 0.0 - 1.0 standard drinks    Comment: occasional wine   Drug use: No   Sexual activity: Never  Other Topics Concern   Not on file  Social History Narrative   Lives with husband and grandson she is raising.   11/07/21 Lives with husband   Social Determinants of Health   Financial Resource Strain: Medium Risk   Difficulty of Paying Living Expenses: Somewhat hard  Food Insecurity: No Food Insecurity   Worried About Charity fundraiser in the Last Year: Never true   Ran Out of Food in the Last Year: Never true  Transportation Needs: No Transportation Needs   Lack of Transportation (Medical): No   Lack of Transportation (Non-Medical): No  Physical Activity: Inactive   Days of Exercise per Week: 0 days   Minutes of Exercise per Session: 0 min  Stress: No Stress Concern Present   Feeling of Stress : Not at all  Social Connections: Socially Integrated   Frequency of Communication with Friends and Family: More than three times a week   Frequency of Social Gatherings with Friends and Family: More than three times a week   Attends Religious Services: 1 to 4 times per year   Active Member of [employer] or Organizations: Yes   Attends Music therapist: More than 4 times per year   Marital Status: Married   Additional Social History:                         Sleep: Good  Appetite:  Good  Current Medications: Current Facility-Administered Medications  Medication Dose Route Frequency Provider Last Rate Last  Admin   acetaminophen (TYLENOL) tablet 650 mg  650 mg Oral Q6H PRN Deloria Lair, NP   650 mg at 12/07/21 0817   alum & mag hydroxide-simeth (MAALOX/MYLANTA) 200-200-20 MG/5ML suspension 30 mL  30 mL Oral Q4H PRN Deloria Lair, NP       chlorproMAZINE (THORAZINE) tablet 25 mg  25 mg Oral QHS Parks Ranger, DO   25 mg at 12/07/21 2039   donepezil (ARICEPT) tablet 5 mg  5 mg Oral QHS Parks Ranger, DO   5 mg at 12/07/21 2039   DULoxetine (CYMBALTA) DR capsule 60 mg  60 mg Oral Daily Deloria Lair, NP  60 mg at 12/08/21 1021   haloperidol (HALDOL) tablet 1 mg  1 mg Oral BH-q8a4p Parks Ranger, DO   1 mg at 12/08/21 1610   levothyroxine (SYNTHROID) tablet 50 mcg  50 mcg Oral QAC breakfast Anette Riedel M, NP   50 mcg at 12/08/21 0641   LORazepam (ATIVAN) tablet 1 mg  1 mg Oral Q4H PRN Parks Ranger, DO   1 mg at 12/04/21 2119   magnesium hydroxide (MILK OF MAGNESIA) suspension 30 mL  30 mL Oral Daily PRN Deloria Lair, NP       OLANZapine (ZYPREXA) tablet 5 mg  5 mg Oral Q4H PRN Parks Ranger, DO   5 mg at 12/05/21 2108   propranolol (INDERAL) tablet 20 mg  20 mg Oral TID Deloria Lair, NP   20 mg at 12/08/21 1021    Lab Results: No results found for this or any previous visit (from the past 48 hour(s)).  Blood Alcohol level:  Lab Results  Component Value Date   ETH <10 96/02/5408    Metabolic Disorder Labs: Lab Results  Component Value Date   HGBA1C 6.4 10/27/2021   No results found for: PROLACTIN Lab Results  Component Value Date   CHOL 186 10/19/2021   TRIG 140.0 10/19/2021   HDL 70.90 10/19/2021   CHOLHDL 3 10/19/2021   VLDL 28.0 10/19/2021   LDLCALC 87 10/19/2021   LDLCALC 99 09/22/2020    Physical Findings: AIMS:  , ,  ,  ,    CIWA:    COWS:     Musculoskeletal: Strength & Muscle Tone: within normal limits Gait & Station: unable to stand Patient leans: N/A  Psychiatric Specialty Exam:  Presentation   General Appearance: Bizarre; Disheveled  Eye Contact:Minimal  Speech:Pressured  Speech Volume:Increased  Handedness:Right   Mood and Affect  Mood:Angry; Depressed; Dysphoric; Hopeless  Affect:Depressed; Tearful; Full Range   Thought Process  Thought Processes:Disorganized; Irrevelant  Descriptions of Associations:Loose  Orientation:Partial  Thought Content:Delusions; Illogical; Scattered; Paranoid Ideation  History of Schizophrenia/Schizoaffective disorder:No  Duration of Psychotic Symptoms:N/A  Hallucinations:No data recorded Ideas of Reference:Delusions; Paranoia  Suicidal Thoughts:No data recorded Homicidal Thoughts:No data recorded  Sensorium  Memory:Remote Poor  Judgment:Impaired  Insight:None   Executive Functions  Concentration:Poor  Attention Span:Poor  Recall:Poor  Fund of Knowledge:Poor  Language:Poor   Psychomotor Activity  Psychomotor Activity:No data recorded  Assets  Assets:Financial Resources/Insurance; Housing; Intimacy; Social Support   Sleep  Sleep:No data recorded   Physical Exam: Physical Exam Vitals and nursing note reviewed.  Constitutional:      Appearance: Normal appearance. She is normal weight.  Neurological:     General: No focal deficit present.     Mental Status: She is alert and oriented to person, place, and time.  Psychiatric:        Attention and Perception: Attention and perception normal.        Mood and Affect: Mood and affect normal.        Speech: She is noncommunicative. Speech is slurred and tangential.        Behavior: Behavior is slowed. Behavior is cooperative.        Cognition and Memory: Cognition is impaired. Memory is impaired.        Judgment: Judgment is inappropriate.   Review of Systems  Constitutional: Negative.   HENT: Negative.    Eyes: Negative.   Respiratory: Negative.    Cardiovascular: Negative.   Gastrointestinal: Negative.   Genitourinary: Negative.  Musculoskeletal: Negative.   Skin: Negative.   Neurological: Negative.   Endo/Heme/Allergies: Negative.   Psychiatric/Behavioral:  Positive for memory loss.   Blood pressure 117/72, pulse 65, temperature 98.3 F (36.8 C), temperature source Oral, resp. rate 18, height 5\' 2"  (1.575 m), weight 86.2 kg, SpO2 97 %. Body mass index is 34.75 kg/m.   Treatment Plan Summary: Daily contact with patient to assess and evaluate symptoms and progress in treatment, Medication management, and Plan continue current medications.  [place], DO 12/08/2021, 11:07 AM

## 2021-12-08 NOTE — Progress Notes (Signed)
Patient remain alert and awake with 1:1 safety monitoring in place. No complaints or discomfort voiced. Remain safe on the unit with Q15 minute safety checks.

## 2021-12-09 DIAGNOSIS — R4182 Altered mental status, unspecified: Secondary | ICD-10-CM | POA: Diagnosis not present

## 2021-12-09 NOTE — Progress Notes (Addendum)
[facility] MD Progress Note  12/09/2021 12:30 PM Megan Duncan  MRN:  [id number] Subjective:  Patient was initially in for psychiatric evaluation by Dr. Louis Meckel on December 03, 2021 for altered mental status, agitation, and depression.  Patient reports that she does not know why she is here.  "I have lived with bipolar my life but I am not having any current issues."  Does acknowledge having depression and believes that it started on "July 8."  Does not elaborate on this.  Does not provide the intensity of her depression today.    Indicates that everything is "out of order" for her cognitively, yet denies confusion.    Talks about some bruising that she has had recently.  Does not know why.    She did sleep fair last night but says that she used to sleep better in the past on another medication which she cannot recall.  She is eating well with assistance and reminders.  She was irritable yesterday but is much more pleasant today.  She speaks fondly about a friend who had passed away named Pat.  Principal Problem: Altered mental status Diagnosis: Principal Problem:   Altered mental status  Total Time spent with patient: 40 minutes.  Past Psychiatric History: On Lithium for years  Past Medical History:  Past Medical History:  Diagnosis Date   Acute pain of right shoulder 09/24/2018   Acute upper respiratory infection 09/19/2010   Allergic rhinitis 10/03/2016   Anterolisthesis of lumbar spine    Arthritis    hands   Bilateral hip pain 09/02/2014   Bipolar II disorder    Cellulitis and abscess of trunk 09/15/2009   Ceruminosis 03/04/2014   Cervicalgia 06/20/2007   Chronic low back pain 06/20/2007   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-sided sciatica 08/06/2012   COPD (chronic obstructive pulmonary disease) ([place])    Deformity of finger 07/15/2014   Degeneration of lumbar intervertebral disc 08/22/2020   Degenerative lumbar spinal stenosis    Dermatitis 19/62/2297   Diastolic  dysfunction    Per pt, diagnosed after [facility]   Diverticulosis    DOE (dyspnea on exertion) 07/20/2016   Essential (primary) hypertension 04/23/2007   Estrogen deficiency 09/01/2017   External hemorrhoid 08/01/2015   Fibromyalgia    Foraminal stenosis of lumbar region 08/22/2020   Generalized anxiety disorder 09/01/2018   Hyperlipidemia    Inflammatory disease of uterus 04/23/2007   Left flank pain 07/13/2021   Left upper quadrant pain 09/01/2017   Lower abdominal pain 08/06/2012   Lumbar back pain with radiculopathy affecting left lower extremity 01/24/2010   Lumbar radiculopathy 01/26/2021   Lump in upper inner quadrant of left breast 07/13/2021   Major depressive disorder    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   Otitis media 09/05/2010   Palpitations 07/20/2016   Peripheral neuropathy 02/22/2010   Retrolisthesis of vertebrae 08/22/2020   Shingles outbreak 04/14/2015   Sinusitis, acute maxillary 09/14/2013   Stress incontinence, female 08/01/2015   Thoracic aortic atherosclerosis 05/14/2017   Tinea corporis 09/01/2017   Tinnitus 03/04/2014   Torticollis 03/04/2014   Type II diabetes mellitus 06/05/2018    Past Surgical History:  Procedure Laterality Date   APPENDECTOMY     EYE SURGERY     KNEE ARTHROSCOPY Right 11/12/2006   TONSILLECTOMY     TOTAL ABDOMINAL HYSTERECTOMY     Family History:  Family History  Problem Relation Age of Onset   Hypertension Mother    Hiatal hernia Mother  Diabetes Father    Hypertension Father    Heart attack Father    Alcoholism Father    Bipolar disorder Father    Mental illness Sister    Suicidality Sister    Colon cancer Maternal Aunt 40   Breast cancer Maternal Aunt    Bipolar disorder Paternal Aunt    Family Psychiatric  History: Sister committed suicide. Social History:  Social History   Substance and Sexual Activity  Alcohol Use Yes   Alcohol/week: 0.0 - 1.0 standard drinks   Comment:  occasional wine     Social History   Substance and Sexual Activity  Drug Use No    Social History   Socioeconomic History   Marital status: Married    Spouse name: Maryem Shuffler   Number of children: 3   Years of education: 14   Highest education level: Some college, no degree  Occupational History   Occupation: retired  Tobacco Use   Smoking status: Former    Packs/day: 1.50    Years: 40.00    Pack years: 60.00    Types: Cigarettes    Quit date: 07/02/2004    Years since quitting: 17.4    Passive exposure: Past   Smokeless tobacco: Never   Tobacco comments:    Verified by Aldona Lento (Husband), & Lauralee Evener (Daughter)  Vaping Use   Vaping Use: Never used  Substance and Sexual Activity   Alcohol use: Yes    Alcohol/week: 0.0 - 1.0 standard drinks    Comment: occasional wine   Drug use: No   Sexual activity: Never  Other Topics Concern   Not on file  Social History Narrative   Lives with husband and grandson she is raising.   11/07/21 Lives with husband   Social Determinants of Health   Financial Resource Strain: Medium Risk   Difficulty of Paying Living Expenses: Somewhat hard  Food Insecurity: No Food Insecurity   Worried About Charity fundraiser in the Last Year: Never true   Ran Out of Food in the Last Year: Never true  Transportation Needs: No Transportation Needs   Lack of Transportation (Medical): No   Lack of Transportation (Non-Medical): No  Physical Activity: Inactive   Days of Exercise per Week: 0 days   Minutes of Exercise per Session: 0 min  Stress: No Stress Concern Present   Feeling of Stress : Not at all  Social Connections: Socially Integrated   Frequency of Communication with Friends and Family: More than three times a week   Frequency of Social Gatherings with Friends and Family: More than three times a week   Attends Religious Services: 1 to 4 times per year   Active Member of [employer] or Organizations: Yes   Attends Programme researcher, broadcasting/film/video: More than 4 times per year   Marital Status: Married   Additional Social History:                         Sleep: Good  Appetite:  Good  Current Medications: Current Facility-Administered Medications  Medication Dose Route Frequency Provider Last Rate Last Admin   acetaminophen (TYLENOL) tablet 650 mg  650 mg Oral Q6H PRN Deloria Lair, NP   650 mg at 12/07/21 0817   alum & mag hydroxide-simeth (MAALOX/MYLANTA) 200-200-20 MG/5ML suspension 30 mL  30 mL Oral Q4H PRN Deloria Lair, NP       chlorproMAZINE (THORAZINE) tablet 25 mg  25 mg Oral  QHS Parks Ranger, DO   25 mg at 12/07/21 2039   donepezil (ARICEPT) tablet 5 mg  5 mg Oral QHS Parks Ranger, DO   5 mg at 12/07/21 2039   DULoxetine (CYMBALTA) DR capsule 60 mg  60 mg Oral Daily Anette Riedel M, NP   60 mg at 12/09/21 0848   haloperidol (HALDOL) tablet 1 mg  1 mg Oral BH-q8a4p Parks Ranger, DO   1 mg at 12/09/21 8341   levothyroxine (SYNTHROID) tablet 50 mcg  50 mcg Oral QAC breakfast Anette Riedel M, NP   50 mcg at 12/09/21 0848   LORazepam (ATIVAN) tablet 1 mg  1 mg Oral Q4H PRN Parks Ranger, DO   1 mg at 12/04/21 2119   magnesium hydroxide (MILK OF MAGNESIA) suspension 30 mL  30 mL Oral Daily PRN Deloria Lair, NP       OLANZapine (ZYPREXA) tablet 5 mg  5 mg Oral Q4H PRN Parks Ranger, DO   5 mg at 12/05/21 2108   propranolol (INDERAL) tablet 20 mg  20 mg Oral TID Deloria Lair, NP   20 mg at 12/09/21 9622    Lab Results: No results found for this or any previous visit (from the past 48 hour(s)).  Blood Alcohol level:  Lab Results  Component Value Date   ETH <10 29/79/8921    Metabolic Disorder Labs: Lab Results  Component Value Date   HGBA1C 6.4 10/27/2021   No results found for: PROLACTIN Lab Results  Component Value Date   CHOL 186 10/19/2021   TRIG 140.0 10/19/2021   HDL 70.90 10/19/2021   CHOLHDL 3 10/19/2021    VLDL 28.0 10/19/2021   LDLCALC 87 10/19/2021   LDLCALC 99 09/22/2020    Physical Findings: AIMS:  , ,  ,  ,    CIWA:    COWS:     Musculoskeletal: Strength & Muscle Tone: within normal limits Gait & Station: unable to stand Patient leans: N/A  Psychiatric Specialty Exam:  Presentation  General Appearance: Bizarre; Disheveled  Eye Contact:Minimal  Speech:wnl Speech Volume:Increased  Handedness:Right   Mood and Affect  Mood:Depression Affect:bright  Thought Process  Thought Processes:Disorganized; Irrevelant  Descriptions of Associations:Loose  Orientation:Partial  Thought Content:Delusions; Illogical; Scattered; Paranoid Ideation  History of Schizophrenia/Schizoaffective disorder:No  Duration of Psychotic Symptoms:N/A  Hallucinations:No data recorded Ideas of Reference:Delusions; Paranoia  Suicidal Thoughts:No data recorded Homicidal Thoughts:No data recorded  Sensorium  Memory:Remote Poor  Judgment:Impaired  Insight:None   Executive Functions  Concentration:Poor  Attention Span:Poor  Recall:Poor  Fund of Knowledge:Poor  Language:Poor   Psychomotor Activity  Psychomotor Activity:No data recorded  Assets  Assets:Financial Resources/Insurance; Housing; Intimacy; Social Support   Sleep  Sleep:No data recorded    Review of Systems  Constitutional: Negative.   HENT: Negative.    Eyes: Negative.   Respiratory: Negative.    Cardiovascular: Negative.   Gastrointestinal: Negative.   Genitourinary: Negative.   Musculoskeletal: Negative.   Skin: Negative.   Neurological: Negative.   Endo/Heme/Allergies: Negative.   Psychiatric/Behavioral:  Positive for memory loss.   Blood pressure 113/66, pulse 75, temperature 98.1 F (36.7 C), temperature source Oral, resp. rate 18, height 5\' 2"  (1.575 m), weight 86.2 kg, SpO2 95 %. Body mass index is 34.75 kg/m.   Treatment Plan Summary: Daily contact with patient to assess and evaluate  symptoms and progress in treatment, Medication management, and Plan continue current medications.  1/28 Spoke with pt's daughter today. She is wanting  to complete an MRI brain. Will defer to primary team.     Rulon Sera, MD 12/09/2021, 12:30 PM

## 2021-12-09 NOTE — BH IP Treatment Plan (Signed)
Interdisciplinary Treatment and Diagnostic Plan Update  12/09/2021 Time of Session: [place] MRN: [id number]  Principal Diagnosis: Altered mental status  Secondary Diagnoses: Principal Problem:   Altered mental status   Current Medications:  Current Facility-Administered Medications  Medication Dose Route Frequency Provider Last Rate Last Admin   acetaminophen (TYLENOL) tablet 650 mg  650 mg Oral Q6H PRN Deloria Lair, NP   650 mg at 12/07/21 0817   alum & mag hydroxide-simeth (MAALOX/MYLANTA) 200-200-20 MG/5ML suspension 30 mL  30 mL Oral Q4H PRN Deloria Lair, NP       chlorproMAZINE (THORAZINE) tablet 25 mg  25 mg Oral QHS Parks Ranger, DO   25 mg at 12/07/21 2039   donepezil (ARICEPT) tablet 5 mg  5 mg Oral QHS Parks Ranger, DO   5 mg at 12/07/21 2039   DULoxetine (CYMBALTA) DR capsule 60 mg  60 mg Oral Daily Anette Riedel M, NP   60 mg at 12/09/21 0848   haloperidol (HALDOL) tablet 1 mg  1 mg Oral BH-[id number] Parks Ranger, DO   1 mg at 12/09/21 0849   levothyroxine (SYNTHROID) tablet 50 mcg  50 mcg Oral QAC breakfast Anette Riedel M, NP   50 mcg at 12/09/21 0848   LORazepam (ATIVAN) tablet 1 mg  1 mg Oral Q4H PRN Parks Ranger, DO   1 mg at 12/04/21 2119   magnesium hydroxide (MILK OF MAGNESIA) suspension 30 mL  30 mL Oral Daily PRN Deloria Lair, NP       OLANZapine (ZYPREXA) tablet 5 mg  5 mg Oral Q4H PRN Parks Ranger, DO   5 mg at 12/05/21 2108   propranolol (INDERAL) tablet 20 mg  20 mg Oral TID Deloria Lair, NP   20 mg at 12/09/21 0849   PTA Medications: Medications Prior to Admission  Medication Sig Dispense Refill Last Dose   albuterol (VENTOLIN HFA) 108 (90 Base) MCG/ACT inhaler Inhale 2 puffs into the lungs every 4 (four) hours as needed. For shortness of breath and wheezing 18 g 3    ALPRAZolam (XANAX) 0.5 MG tablet Take 30 min prior to procedure 10 tablet 0    amLODipine (NORVASC) 5 MG  tablet TAKE 1 TABLET (5 MG TOTAL) BY MOUTH DAILY. 90 tablet 1    blood glucose meter kit and supplies Dispense based on patient and insurance preference. Use once a day.  DX Code: E11.9 1 each 0    cephALEXin (KEFLEX) 500 MG capsule Take 1 capsule (500 mg total) by mouth 2 (two) times daily. 14 capsule 0    clorazepate (TRANXENE) 3.75 MG tablet Take 1 tablet (3.75 mg total) by mouth 2 (two) times daily as needed for anxiety or sleep. 60 tablet 1    DULoxetine (CYMBALTA) 30 MG capsule Take 1 capsule (30 mg total) by mouth daily. For 2 weeks, then start 60 mg daily. 14 capsule 0    DULoxetine (CYMBALTA) 60 MG capsule TAKE 1 CAPSULE BY MOUTH EVERY DAY IN THE MORNING, after taking 30 mg for 2 weeks. 90 capsule 0    glucose blood test strip Use as instructed once a day.  Dx Code: E11.9 30 each 2    hydrocortisone-pramoxine (ANALPRAM HC) 2.5-1 % rectal cream Place 1 application rectally 3 (three) times daily. 30 g 3    Lancets MISC Use as directed once a day.  Dx code: E11.9 30 each 2    levothyroxine (SYNTHROID) 50 MCG tablet TAKE  1 TABLET BY MOUTH DAILY BEFORE BREAKFAST 90 tablet 1    lithium carbonate 150 MG capsule TAKE 1 CAPSULE (150 MG TOTAL) BY MOUTH DAILY, TAKE WITH LITHIUM 600 MG TO EQUAL 750 MG TOTAL 90 capsule 0    lithium carbonate 300 MG capsule TAKE 2 CAPSULES (600 MG) BY MOUTH AT BEDTIME, WITH 150 MG CAPSULE 180 capsule 0    metFORMIN (GLUCOPHAGE) 1000 MG tablet Take 1 tablet (1,000 mg total) by mouth 2 (two) times daily with a meal. 180 tablet 1    ondansetron (ZOFRAN) 4 MG tablet Take 1 tablet (4 mg total) by mouth every 8 (eight) hours as needed for nausea or vomiting. 15 tablet 0    QUEtiapine (SEROQUEL) 50 MG tablet Take 1 tablet (50 mg total) by mouth at bedtime. 30 tablet 2    traZODone (DESYREL) 50 MG tablet TAKE 1/2 TO 2 TABLETS BY MOUTH EVERY DAY AT BEDTIME AS NEEDED FOR SLEEP 180 tablet 0     Patient Stressors: Health problems    Patient Strengths: Ability for insight   General fund of knowledge  Supportive family/friends   Treatment Modalities: Medication Management, Group therapy, Case management,  1 to 1 session with clinician, Psychoeducation, Recreational therapy.   Physician Treatment Plan for Primary Diagnosis: Altered mental status Long Term Goal(s): Improvement in symptoms so as ready for discharge   Short Term Goals: Ability to identify changes in lifestyle to reduce recurrence of condition will improve Ability to verbalize feelings will improve Ability to disclose and discuss suicidal ideas Ability to demonstrate self-control will improve Ability to identify and develop effective coping behaviors will improve Ability to maintain clinical measurements within normal limits will improve Compliance with prescribed medications will improve Ability to identify triggers associated with substance abuse/mental health issues will improve  Medication Management: Evaluate patient's response, side effects, and tolerance of medication regimen.  Therapeutic Interventions: 1 to 1 sessions, Unit Group sessions and Medication administration.  Evaluation of Outcomes: Progressing  Physician Treatment Plan for Secondary Diagnosis: Principal Problem:   Altered mental status  Long Term Goal(s): Improvement in symptoms so as ready for discharge   Short Term Goals: Ability to identify changes in lifestyle to reduce recurrence of condition will improve Ability to verbalize feelings will improve Ability to disclose and discuss suicidal ideas Ability to demonstrate self-control will improve Ability to identify and develop effective coping behaviors will improve Ability to maintain clinical measurements within normal limits will improve Compliance with prescribed medications will improve Ability to identify triggers associated with substance abuse/mental health issues will improve     Medication Management: Evaluate patient's response, side effects, and  tolerance of medication regimen.  Therapeutic Interventions: 1 to 1 sessions, Unit Group sessions and Medication administration.  Evaluation of Outcomes: Progressing   RN Treatment Plan for Primary Diagnosis: Altered mental status Long Term Goal(s): Knowledge of disease and therapeutic regimen to maintain health will improve  Short Term Goals: Ability to remain free from injury will improve, Ability to verbalize frustration and anger appropriately will improve, Ability to demonstrate self-control, Ability to participate in decision making will improve, Ability to verbalize feelings will improve, Ability to disclose and discuss suicidal ideas, Ability to identify and develop effective coping behaviors will improve, and Compliance with prescribed medications will improve  Medication Management: RN will administer medications as ordered by provider, will assess and evaluate patient's response and provide education to patient for prescribed medication. RN will report any adverse and/or side effects to prescribing provider.  Therapeutic Interventions: 1 on 1 counseling sessions, Psychoeducation, Medication administration, Evaluate responses to treatment, Monitor vital signs and CBGs as ordered, Perform/monitor CIWA, COWS, AIMS and Fall Risk screenings as ordered, Perform wound care treatments as ordered.  Evaluation of Outcomes: Progressing   LCSW Treatment Plan for Primary Diagnosis: Altered mental status Long Term Goal(s): Safe transition to appropriate next level of care at discharge, Engage patient in therapeutic group addressing interpersonal concerns.  Short Term Goals: Engage patient in aftercare planning with referrals and resources, Increase social support, Increase ability to appropriately verbalize feelings, Increase emotional regulation, Facilitate acceptance of mental health diagnosis and concerns, Facilitate patient progression through stages of change regarding substance use diagnoses  and concerns, Identify triggers associated with mental health/substance abuse issues, and Increase skills for wellness and recovery  Therapeutic Interventions: Assess for all discharge needs, 1 to 1 time with Social worker, Explore available resources and support systems, Assess for adequacy in community support network, Educate family and significant other(s) on suicide prevention, Complete Psychosocial Assessment, Interpersonal group therapy.  Evaluation of Outcomes: Progressing   Progress in Treatment: Attending groups: Yes. Participating in groups: Yes. Taking medication as prescribed: Yes. Toleration medication: Yes. Family/Significant other contact made: No, will contact:  pt declined. Patient understands diagnosis: No. Discussing patient identified problems/goals with staff: No. Medical problems stabilized or resolved: Yes. Denies suicidal/homicidal ideation: Yes. Issues/concerns per patient self-inventory: No. Other: N/A  New Short Term/Long Term Goal(s): Patient to work towards elimination of symptoms of psychosis, medication management for mood stabilization; development of comprehensive mental wellness plan.     Patient Goals:  No update.    Discharge Plan or Barriers: CSW will assist pt in development of appropriate discharge/aftercare plan.   Reason for Continuation of Hospitalization: Aggression Delusions  Depression Medication stabilization   Estimated Length of Stay: TBD   Scribe for Treatment Team: Vanessa Kick 12/09/2021 12:23 PM

## 2021-12-09 NOTE — Progress Notes (Signed)
Patient had a visitor during shift. She was alert and awake with 1:1 safety monitoring in place.  She was medication compliant. She sate up in there geri chair until she opted to go to bed.   Remain safe on the unit with q15 minute safety check.

## 2021-12-09 NOTE — Plan of Care (Signed)
Patient has been sitting in the dayroom, sider beside her. Pleasant and talking to staff and peers. Continues to experience moderated confusions but able to express her needs. Patient's daughter called and indicated that pt will need MRI to be done ASAP.

## 2021-12-09 NOTE — Progress Notes (Signed)
Pas was sleeping at the beginning of this shift. Has just gotten out of bed and currently eating breakfast. Safety maintained per 1:1 protocol.

## 2021-12-09 NOTE — Progress Notes (Signed)
Patient ate 50 percent of her dinner. Currently visiting with a family member. No sign of distress.

## 2021-12-09 NOTE — Final Progress Note (Signed)
Patient ate breakfast and received AM medications. Pleasant but continues to experience difficulties with thought process. Safety precautions reinforced.

## 2021-12-09 NOTE — Progress Notes (Signed)
Patient is currently in room, sitter at bedside. No sign of distress. Ate about 75 percent of her lunch.

## 2021-12-10 ENCOUNTER — Other Ambulatory Visit: Payer: Self-pay | Admitting: Family Medicine

## 2021-12-10 DIAGNOSIS — R4182 Altered mental status, unspecified: Secondary | ICD-10-CM | POA: Diagnosis not present

## 2021-12-10 MED ORDER — LIDOCAINE 5 % EX PTCH
1.0000 | MEDICATED_PATCH | Freq: Every day | CUTANEOUS | Status: DC | PRN
  Administered 2021-12-14 – 2021-12-21 (×3): 1 via TRANSDERMAL
  Filled 2021-12-10 (×6): qty 1

## 2021-12-10 NOTE — Progress Notes (Signed)
Patient is observed sitting in geriatric chair in the dayroom. Pt is calm and cooperative. Pt is medication compliant. Pt is only oriented to self. Pt denies anxiety and depression. UTA SI, HI, and AVH. Pt remains confused about the time, place and situation. Pt voiced no complaints to this Probation officer. Pt remains safe on the unit at this time with 1:1 order in place.

## 2021-12-10 NOTE — Progress Notes (Signed)
Patient is observed to be calmly sitting in the dayroom watching TV. Patient is not observed to be in any distress at this time. Patient remains safe on the unit at this time.

## 2021-12-10 NOTE — Progress Notes (Signed)
Patient is sleeping with 1:1 safety sitter at bedside. Patient remains safe on the unit at this time.

## 2021-12-10 NOTE — Progress Notes (Signed)
[facility] MD Progress Note  12/10/2021 10:53 AM Megan Duncan  MRN:  [id number] Subjective:  Patient was initially in for psychiatric evaluation by Dr. Louis Meckel on December 03, 2021 for altered mental status, agitation, and depression.  1/29 Patient is bright in affect today.  Tells me that she is feeling okay.  Indicates that she is still trying to figure things out as she was at home and then here.  Does not know how she got here.  She believes that she slept well.  Indicates having back pain for the past 2 years with some exacerbation this morning.  Per report, she ate 75% of breakfast and 50% of her dinner.  She will be eating to breakfast a bit later today.  Yesterday, patient was more visible on the unit, slightly up than usual.  She did have some sundowning in the afternoon and confusion.  She was not combative.  She did sleep throughout the whole night.  1/28 Patient reports that she does not know why she is here.  "I have lived with bipolar my life but I am not having any current issues."  Does acknowledge having depression and believes that it started on "July 8."  Does not elaborate on this.  Does not provide the intensity of her depression today.    Indicates that everything is "out of order" for her cognitively, yet denies confusion.    Talks about some bruising that she has had recently.  Does not know why.    She did sleep fair last night but says that she used to sleep better in the past on another medication which she cannot recall.  She is eating well with assistance and reminders.  She was irritable yesterday but is much more pleasant today.  She speaks fondly about a friend who had passed away named Pat.  Principal Problem: Altered mental status Diagnosis: Principal Problem:   Altered mental status  Total Time spent with patient: 40 minutes.  Past Psychiatric History: On Lithium for years  Past Medical History:  Past Medical History:  Diagnosis Date   Acute pain of right  shoulder 09/24/2018   Acute upper respiratory infection 09/19/2010   Allergic rhinitis 10/03/2016   Anterolisthesis of lumbar spine    Arthritis    hands   Bilateral hip pain 09/02/2014   Bipolar II disorder    Cellulitis and abscess of trunk 09/15/2009   Ceruminosis 03/04/2014   Cervicalgia 06/20/2007   Chronic low back pain 06/20/2007   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-sided sciatica 08/06/2012   COPD (chronic obstructive pulmonary disease) ([place])    Deformity of finger 07/15/2014   Degeneration of lumbar intervertebral disc 08/22/2020   Degenerative lumbar spinal stenosis    Dermatitis 40/98/1191   Diastolic dysfunction    Per pt, diagnosed after [facility]   Diverticulosis    DOE (dyspnea on exertion) 07/20/2016   Essential (primary) hypertension 04/23/2007   Estrogen deficiency 09/01/2017   External hemorrhoid 08/01/2015   Fibromyalgia    Foraminal stenosis of lumbar region 08/22/2020   Generalized anxiety disorder 09/01/2018   Hyperlipidemia    Inflammatory disease of uterus 04/23/2007   Left flank pain 07/13/2021   Left upper quadrant pain 09/01/2017   Lower abdominal pain 08/06/2012   Lumbar back pain with radiculopathy affecting left lower extremity 01/24/2010   Lumbar radiculopathy 01/26/2021   Lump in upper inner quadrant of left breast 07/13/2021   Major depressive disorder    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   Otitis media 09/05/2010   Palpitations 07/20/2016   Peripheral neuropathy 02/22/2010   Retrolisthesis of vertebrae 08/22/2020   Shingles outbreak 04/14/2015   Sinusitis, acute maxillary 09/14/2013   Stress incontinence, female 08/01/2015   Thoracic aortic atherosclerosis 05/14/2017   Tinea corporis 09/01/2017   Tinnitus 03/04/2014   Torticollis 03/04/2014   Type II diabetes mellitus 06/05/2018    Past Surgical History:  Procedure Laterality Date   APPENDECTOMY     EYE SURGERY     KNEE ARTHROSCOPY Right  11/12/2006   TONSILLECTOMY     TOTAL ABDOMINAL HYSTERECTOMY     Family History:  Family History  Problem Relation Age of Onset   Hypertension Mother    Hiatal hernia Mother    Diabetes Father    Hypertension Father    Heart attack Father    Alcoholism Father    Bipolar disorder Father    Mental illness Sister    Suicidality Sister    Colon cancer Maternal Aunt 88   Breast cancer Maternal Aunt    Bipolar disorder Paternal Aunt    Family Psychiatric  History: Sister committed suicide. Social History:  Social History   Substance and Sexual Activity  Alcohol Use Yes   Alcohol/week: 0.0 - 1.0 standard drinks   Comment: occasional wine     Social History   Substance and Sexual Activity  Drug Use No    Social History   Socioeconomic History   Marital status: Married    Spouse name: Ayelet Gruenewald   Number of children: 3   Years of education: 14   Highest education level: Some college, no degree  Occupational History   Occupation: retired  Tobacco Use   Smoking status: Former    Packs/day: 1.50    Years: 40.00    Pack years: 60.00    Types: Cigarettes    Quit date: 07/02/2004    Years since quitting: 17.4    Passive exposure: Past   Smokeless tobacco: Never   Tobacco comments:    Verified by Aldona Lento (Husband), & Lauralee Evener (Daughter)  Vaping Use   Vaping Use: Never used  Substance and Sexual Activity   Alcohol use: Yes    Alcohol/week: 0.0 - 1.0 standard drinks    Comment: occasional wine   Drug use: No   Sexual activity: Never  Other Topics Concern   Not on file  Social History Narrative   Lives with husband and grandson she is raising.   11/07/21 Lives with husband   Social Determinants of Health   Financial Resource Strain: Medium Risk   Difficulty of Paying Living Expenses: Somewhat hard  Food Insecurity: No Food Insecurity   Worried About Charity fundraiser in the Last Year: Never true   Ran Out of Food in the Last Year: Never true   Transportation Needs: No Transportation Needs   Lack of Transportation (Medical): No   Lack of Transportation (Non-Medical): No  Physical Activity: Inactive   Days of Exercise per Week: 0 days   Minutes of Exercise per Session: 0 min  Stress: No Stress Concern Present   Feeling of Stress : Not at all  Social Connections: Socially Integrated   Frequency of Communication with Friends and Family: More than three times a week   Frequency of Social Gatherings with Friends and Family: More than three times a week   Attends Religious Services: 1 to 4 times per year   Active Member of [employer] or Organizations: Yes  Attends Music therapist: More than 4 times per year   Marital Status: Married   Additional Social History:                         Sleep: Good  Appetite:  Good  Current Medications: Current Facility-Administered Medications  Medication Dose Route Frequency Provider Last Rate Last Admin   acetaminophen (TYLENOL) tablet 650 mg  650 mg Oral Q6H PRN Deloria Lair, NP   650 mg at 12/10/21 1010   alum & mag hydroxide-simeth (MAALOX/MYLANTA) 200-200-20 MG/5ML suspension 30 mL  30 mL Oral Q4H PRN Deloria Lair, NP       chlorproMAZINE (THORAZINE) tablet 25 mg  25 mg Oral QHS Parks Ranger, DO   25 mg at 12/09/21 2102   donepezil (ARICEPT) tablet 5 mg  5 mg Oral QHS Parks Ranger, DO   5 mg at 12/09/21 2102   DULoxetine (CYMBALTA) DR capsule 60 mg  60 mg Oral Daily Dixon, Rashaun M, NP   60 mg at 12/10/21 1010   haloperidol (HALDOL) tablet 1 mg  1 mg Oral BH-[id number] Parks Ranger, DO   1 mg at 12/10/21 1010   levothyroxine (SYNTHROID) tablet 50 mcg  50 mcg Oral QAC breakfast Anette Riedel M, NP   50 mcg at 12/10/21 1010   LORazepam (ATIVAN) tablet 1 mg  1 mg Oral Q4H PRN Parks Ranger, DO   1 mg at 12/04/21 2119   magnesium hydroxide (MILK OF MAGNESIA) suspension 30 mL  30 mL Oral Daily PRN Deloria Lair, NP        OLANZapine (ZYPREXA) tablet 5 mg  5 mg Oral Q4H PRN Parks Ranger, DO   5 mg at 12/05/21 2108   propranolol (INDERAL) tablet 20 mg  20 mg Oral TID Deloria Lair, NP   20 mg at 12/10/21 1010    Lab Results: No results found for this or any previous visit (from the past 48 hour(s)).  Blood Alcohol level:  Lab Results  Component Value Date   ETH <10 46/56/8127    Metabolic Disorder Labs: Lab Results  Component Value Date   HGBA1C 6.4 10/27/2021   No results found for: PROLACTIN Lab Results  Component Value Date   CHOL 186 10/19/2021   TRIG 140.0 10/19/2021   HDL 70.90 10/19/2021   CHOLHDL 3 10/19/2021   VLDL 28.0 10/19/2021   LDLCALC 87 10/19/2021   LDLCALC 99 09/22/2020    Physical Findings: AIMS:  , ,  ,  ,    CIWA:    COWS:     Musculoskeletal: Strength & Muscle Tone: within normal limits Gait & Station: unable to stand Patient leans: N/A  Psychiatric Specialty Exam:  Presentation  General Appearance: Bizarre; Disheveled  Eye Contact:good Speech:wnl Speech Volume:Increased  Handedness:Right   Mood and Affect  Mood:Good Affect:Full  Thought Process  Thought Processes:Disorganized; Irrevelant  Descriptions of Associations:Loose  Orientation:Partial  Thought Content:Delusions; Illogical; Scattered; Paranoid Ideation  History of Schizophrenia/Schizoaffective disorder:No  Duration of Psychotic Symptoms:N/A  Hallucinations:No data recorded Ideas of Reference:Delusions; Paranoia  Suicidal Thoughts:No data recorded Homicidal Thoughts:No data recorded  Sensorium  Memory:Remote Poor  Judgment:Impaired  Insight:None   Executive Functions  Concentration:Poor  Attention Span:Poor  Recall:Poor  Fund of Knowledge:Poor  Language:Poor   Psychomotor Activity  Psychomotor Activity:No data recorded  Assets  Assets:Financial Resources/Insurance; Housing; Intimacy; Social Support   Sleep  Sleep:No data recorded  Review  of Systems  Constitutional: Negative.   HENT: Negative.    Eyes: Negative.   Respiratory: Negative.    Cardiovascular: Negative.   Gastrointestinal: Negative.   Genitourinary: Negative.   Musculoskeletal: Negative.   Skin: Negative.   Neurological: Negative.   Endo/Heme/Allergies: Negative.   Psychiatric/Behavioral:  Positive for memory loss.   Blood pressure (!) 119/52, pulse 64, temperature 97.7 F (36.5 C), temperature source Oral, resp. rate 18, height 5\' 2"  (1.575 m), weight 86.2 kg, SpO2 97 %. Body mass index is 34.75 kg/m.   Treatment Plan Summary: Daily contact with patient to assess and evaluate symptoms and progress in treatment, Medication management, and Plan continue current medications.  1/28 Spoke with pt's daughter today. She is wanting to complete an MRI brain. Will defer to primary team.   1/29 Add lidoderm patch PRN pain.   Rulon Sera, MD 12/10/2021, 10:53 AM

## 2021-12-10 NOTE — Progress Notes (Signed)
Patient ate dinner and is observed to be sitting calmly in the dayroom. Patient is not observed to be in any distress and remains safe on the unit at this time.

## 2021-12-10 NOTE — Progress Notes (Signed)
Patient woke up around 10 am this morning. Patient was oriented to person but disoriented to time, place, and situation. Patient stated. "I don't know where I am and I don't know how I got here." Patient was re-oriented. Patient took all morning medications without incident. Patient took a shower with assistance from MHT. Patient was able to walk with staff from her room to the dayroom. Patient is currently eating breakfast in the dayroom with 1:1 safety sitter at her side. Patient remains safe on the unit at this time.

## 2021-12-11 ENCOUNTER — Telehealth: Payer: Medicare Other

## 2021-12-11 ENCOUNTER — Telehealth: Payer: Self-pay | Admitting: Family Medicine

## 2021-12-11 DIAGNOSIS — R4182 Altered mental status, unspecified: Secondary | ICD-10-CM | POA: Diagnosis not present

## 2021-12-11 DIAGNOSIS — F315 Bipolar disorder, current episode depressed, severe, with psychotic features: Secondary | ICD-10-CM | POA: Diagnosis not present

## 2021-12-11 MED ORDER — MELOXICAM 7.5 MG PO TABS
7.5000 mg | ORAL_TABLET | Freq: Every day | ORAL | Status: DC
  Administered 2021-12-11 – 2021-12-22 (×12): 7.5 mg via ORAL
  Filled 2021-12-11 (×12): qty 1

## 2021-12-11 NOTE — Progress Notes (Signed)
Patient in bed with 1:1 sitter at bedside. No complaints voiced. No distress noted. Remains safe with sitter and Q15 minute checks in place

## 2021-12-11 NOTE — Progress Notes (Signed)
[facility] MD Progress Note  12/11/2021 11:42 AM Megan Duncan  MRN:  [id number] Subjective: Megan Duncan is doing much better.  She is alert and oriented x2.  She remembers her daughter coming in to visit.  She is able to carry on a conversation.  She denies any side effects from her medication.  I told her we can get physical therapy to see her to help her with ambulation.  She states that she has had bad back pain for the last 2 years.  Her daughter told me that also that is why she does not get out of bed.  I think if we can get her on something like Mobic may be she did have more success with physical therapy.  That would definitely help with her mental status, also.  There is no evidence of EPS or TD.  Nurses notes state that she slept well last night.  She has not needed any as needed medication.  Principal Problem: Altered mental status Diagnosis: Principal Problem:   Altered mental status  Total Time spent with patient: 15 minutes  Past Psychiatric History: Bipolar Disorder  Past Medical History:  Past Medical History:  Diagnosis Date   Acute pain of right shoulder 09/24/2018   Acute upper respiratory infection 09/19/2010   Allergic rhinitis 10/03/2016   Anterolisthesis of lumbar spine    Arthritis    hands   Bilateral hip pain 09/02/2014   Bipolar II disorder    Cellulitis and abscess of trunk 09/15/2009   Ceruminosis 03/04/2014   Cervicalgia 06/20/2007   Chronic low back pain 06/20/2007   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-sided sciatica 08/06/2012   COPD (chronic obstructive pulmonary disease) ([place])    Deformity of finger 07/15/2014   Degeneration of lumbar intervertebral disc 08/22/2020   Degenerative lumbar spinal stenosis    Dermatitis 28/78/6767   Diastolic dysfunction    Per pt, diagnosed after [facility]   Diverticulosis    DOE (dyspnea on exertion) 07/20/2016   Essential (primary) hypertension 04/23/2007   Estrogen deficiency 09/01/2017   External hemorrhoid  08/01/2015   Fibromyalgia    Foraminal stenosis of lumbar region 08/22/2020   Generalized anxiety disorder 09/01/2018   Hyperlipidemia    Inflammatory disease of uterus 04/23/2007   Left flank pain 07/13/2021   Left upper quadrant pain 09/01/2017   Lower abdominal pain 08/06/2012   Lumbar back pain with radiculopathy affecting left lower extremity 01/24/2010   Lumbar radiculopathy 01/26/2021   Lump in upper inner quadrant of left breast 07/13/2021   Major depressive disorder    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   Otitis media 09/05/2010   Palpitations 07/20/2016   Peripheral neuropathy 02/22/2010   Retrolisthesis of vertebrae 08/22/2020   Shingles outbreak 04/14/2015   Sinusitis, acute maxillary 09/14/2013   Stress incontinence, female 08/01/2015   Thoracic aortic atherosclerosis 05/14/2017   Tinea corporis 09/01/2017   Tinnitus 03/04/2014   Torticollis 03/04/2014   Type II diabetes mellitus 06/05/2018    Past Surgical History:  Procedure Laterality Date   APPENDECTOMY     EYE SURGERY     KNEE ARTHROSCOPY Right 11/12/2006   TONSILLECTOMY     TOTAL ABDOMINAL HYSTERECTOMY     Family History:  Family History  Problem Relation Age of Onset   Hypertension Mother    Hiatal hernia Mother    Diabetes Father    Hypertension Father    Heart attack Father    Alcoholism Father    Bipolar disorder  Father    Mental illness Sister    Suicidality Sister    Colon cancer Maternal Aunt 2   Breast cancer Maternal Aunt    Bipolar disorder Paternal Aunt    Family Psychiatric  History: Sister committed suicide Social History:  Social History   Substance and Sexual Activity  Alcohol Use Yes   Alcohol/week: 0.0 - 1.0 standard drinks   Comment: occasional wine     Social History   Substance and Sexual Activity  Drug Use No    Social History   Socioeconomic History   Marital status: Married    Spouse name: Savilla Turbyfill   Number of  children: 3   Years of education: 14   Highest education level: Some college, no degree  Occupational History   Occupation: retired  Tobacco Use   Smoking status: Former    Packs/day: 1.50    Years: 40.00    Pack years: 60.00    Types: Cigarettes    Quit date: 07/02/2004    Years since quitting: 17.4    Passive exposure: Past   Smokeless tobacco: Never   Tobacco comments:    Verified by Aldona Lento (Husband), & Lauralee Evener (Daughter)  Vaping Use   Vaping Use: Never used  Substance and Sexual Activity   Alcohol use: Yes    Alcohol/week: 0.0 - 1.0 standard drinks    Comment: occasional wine   Drug use: No   Sexual activity: Never  Other Topics Concern   Not on file  Social History Narrative   Lives with husband and grandson she is raising.   11/07/21 Lives with husband   Social Determinants of Health   Financial Resource Strain: Medium Risk   Difficulty of Paying Living Expenses: Somewhat hard  Food Insecurity: No Food Insecurity   Worried About Charity fundraiser in the Last Year: Never true   Ran Out of Food in the Last Year: Never true  Transportation Needs: No Transportation Needs   Lack of Transportation (Medical): No   Lack of Transportation (Non-Medical): No  Physical Activity: Inactive   Days of Exercise per Week: 0 days   Minutes of Exercise per Session: 0 min  Stress: No Stress Concern Present   Feeling of Stress : Not at all  Social Connections: Socially Integrated   Frequency of Communication with Friends and Family: More than three times a week   Frequency of Social Gatherings with Friends and Family: More than three times a week   Attends Religious Services: 1 to 4 times per year   Active Member of [employer] or Organizations: Yes   Attends Music therapist: More than 4 times per year   Marital Status: Married   Additional Social History:                         Sleep: Good  Appetite:  Good  Current Medications: Current  Facility-Administered Medications  Medication Dose Route Frequency Provider Last Rate Last Admin   acetaminophen (TYLENOL) tablet 650 mg  650 mg Oral Q6H PRN Deloria Lair, NP   650 mg at 12/11/21 1107   alum & mag hydroxide-simeth (MAALOX/MYLANTA) 200-200-20 MG/5ML suspension 30 mL  30 mL Oral Q4H PRN Deloria Lair, NP       chlorproMAZINE (THORAZINE) tablet 25 mg  25 mg Oral QHS Parks Ranger, DO   25 mg at 12/10/21 2113   donepezil (ARICEPT) tablet 5 mg  5 mg Oral  QHS Parks Ranger, DO   5 mg at 12/10/21 2113   DULoxetine (CYMBALTA) DR capsule 60 mg  60 mg Oral Daily Anette Riedel M, NP   60 mg at 12/11/21 3419   haloperidol (HALDOL) tablet 1 mg  1 mg Oral [id number] Parks Ranger, DO   1 mg at 12/11/21 3790   levothyroxine (SYNTHROID) tablet 50 mcg  50 mcg Oral QAC breakfast Anette Riedel M, NP   50 mcg at 12/11/21 0717   lidocaine (LIDODERM) 5 % 1 patch  1 patch Transdermal Daily PRN Rulon Sera, MD       LORazepam (ATIVAN) tablet 1 mg  1 mg Oral Q4H PRN Parks Ranger, DO   1 mg at 12/04/21 2119   magnesium hydroxide (MILK OF MAGNESIA) suspension 30 mL  30 mL Oral Daily PRN Deloria Lair, NP       OLANZapine (ZYPREXA) tablet 5 mg  5 mg Oral Q4H PRN Parks Ranger, DO   5 mg at 12/05/21 2108   propranolol (INDERAL) tablet 20 mg  20 mg Oral TID Deloria Lair, NP   20 mg at 12/11/21 2409    Lab Results: No results found for this or any previous visit (from the past 48 hour(s)).  Blood Alcohol level:  Lab Results  Component Value Date   ETH <10 73/53/2992    Metabolic Disorder Labs: Lab Results  Component Value Date   HGBA1C 6.4 10/27/2021   No results found for: PROLACTIN Lab Results  Component Value Date   CHOL 186 10/19/2021   TRIG 140.0 10/19/2021   HDL 70.90 10/19/2021   CHOLHDL 3 10/19/2021   VLDL 28.0 10/19/2021   LDLCALC 87 10/19/2021   LDLCALC 99 09/22/2020    Physical Findings: AIMS:  , ,  ,  ,     CIWA:    COWS:     Musculoskeletal: Strength & Muscle Tone: decreased Gait & Station: unsteady Patient leans: N/A  Psychiatric Specialty Exam:  Presentation  General Appearance: Bizarre; Disheveled  Eye Contact:Minimal  Speech:Pressured  Speech Volume:Increased  Handedness:Right   Mood and Affect  Mood:Angry; Depressed; Dysphoric; Hopeless  Affect:Depressed; Tearful; Full Range   Thought Process  Thought Processes:Disorganized; Irrevelant  Descriptions of Associations:Loose  Orientation:Partial  Thought Content:Delusions; Illogical; Scattered; Paranoid Ideation  History of Schizophrenia/Schizoaffective disorder:No  Duration of Psychotic Symptoms:N/A  Hallucinations:No data recorded Ideas of Reference:Delusions; Paranoia  Suicidal Thoughts:No data recorded Homicidal Thoughts:No data recorded  Sensorium  Memory:Remote Poor  Judgment:Impaired  Insight:None   Executive Functions  Concentration:Poor  Attention Span:Poor  Recall:Poor  Fund of Knowledge:Poor  Language:Poor   Psychomotor Activity  Psychomotor Activity:No data recorded  Assets  Assets:Financial Resources/Insurance; Housing; Intimacy; Social Support   Sleep  Sleep:No data recorded   Physical Exam: Physical Exam Vitals and nursing note reviewed.  Constitutional:      Appearance: Normal appearance. She is normal weight.  Neurological:     General: No focal deficit present.     Mental Status: She is alert and oriented to person, place, and time.  Psychiatric:        Attention and Perception: Attention and perception normal.        Mood and Affect: Mood and affect normal.        Speech: Speech normal.        Behavior: Behavior normal. Behavior is cooperative.        Thought Content: Thought content normal.        Cognition and Memory:  Cognition is impaired. Memory is impaired.        Judgment: Judgment normal.   Review of Systems  Constitutional: Negative.   HENT:  Negative.    Eyes: Negative.   Respiratory: Negative.    Cardiovascular: Negative.   Gastrointestinal: Negative.   Genitourinary: Negative.   Musculoskeletal: Negative.   Skin: Negative.   Neurological: Negative.   Endo/Heme/Allergies: Negative.   Psychiatric/Behavioral:  Positive for memory loss.   Blood pressure 124/79, pulse 71, temperature 98 F (36.7 C), temperature source Oral, resp. rate 20, height 5\' 2"  (1.575 m), weight 86.2 kg, SpO2 98 %. Body mass index is 34.75 kg/m.   Treatment Plan Summary: Daily contact with patient to assess and evaluate symptoms and progress in treatment, Medication management, and Plan physical therapy to evaluate and treat.  Start Mobic 7.5 mg daily.  Parks Ranger, DO 12/11/2021, 11:42 AM

## 2021-12-11 NOTE — Progress Notes (Signed)
Patient noted in her room in bed asleep. 1:1 sitter at bedside. Patient remains safe with 1:1 sitter and Q 15 minute safety checks in place.

## 2021-12-11 NOTE — Group Note (Signed)
LCSW Group Therapy Note  Group Date: 12/11/2021 Start Time: 1400 End Time: 1430   Type of Therapy and Topic:  Group Therapy - How To Cope with Nervousness about Discharge   Participation Level:  Did Not Attend   Description of Group This process group involved identification of patients' feelings about discharge. Some of them are scheduled to be discharged soon, while others are new admissions, but each of them was asked to share thoughts and feelings surrounding discharge from the hospital. One common theme was that they are excited at the prospect of going home, while another was that many of them are apprehensive about sharing why they were hospitalized. Patients were given the opportunity to discuss these feelings with their peers in preparation for discharge.  Therapeutic Goals  Patient will identify their overall feelings about pending discharge. Patient will think about how they might proactively address issues that they believe will once again arise once they get home (i.e. with parents). Patients will participate in discussion about having hope for change.   Summary of Patient Progress:  X   Therapeutic Modalities Cognitive Behavioral Therapy   Jsoeph Podesta A [place], LCSWA 12/11/2021  3:17 PM

## 2021-12-11 NOTE — Evaluation (Signed)
Physical Therapy Evaluation Patient Details Name: Megan Duncan MRN: [id number] DOB: 07-11-1946 Today's Date: 12/11/2021  History of Present Illness  Pt is a 76 yo female that presented to the ED due to concerns of AMS, hallucinations, recent multiple ED visits. PMH of bipolar disorder, cervicalagia, chronic LBP, COPD, fibromyalgia, dementia.   Clinical Impression  Pt without family in room, was oriented to self and place. Able to follow one step commands with time/repetition (pt HOH). She reported that she lives with her husband in a two story home, pt questionable historian. Per chart review, pt has not ambulated much recently per daughter due to increased back pain.    The patient was sleeping upon entrance to room, does wake to touch. Initially resistant to getting up and moving due to wanting to take a nap and back pain, but with education, time and blanket removal agreed to sit up. Supervision to transition to EOB. Sit <> stand with RW and CGA, cued for hand placement to maximize safety. The patient was able to ambulate ~93ft with rw and CGA-minA, 1-2 small LOB noted. She was able to walk upright, with proper posture and RW positioning with extensive cueing. Pt spontaneously returns to flexed trunk BUE support/forearms down on RW, and reported significant fatigue. Does tend to try to sit in hallway despite safety cueing, recommend chair follow for distances >42ft. Returned to room with all needs in reach.  Overall the patient demonstrated deficits (see "PT Problem List") that impede the patient's functional abilities, safety, and mobility and would benefit from skilled PT intervention. Recommendation at this time is to discharge with 24/7 supervision/assistance and follow up therapies to maximize pt safety, function, and mobility.         Recommendations for follow up therapy are one component of a multi-disciplinary discharge planning process, led by the attending physician.   Recommendations may be updated based on patient status, additional functional criteria and insurance authorization.  Follow Up Recommendations Other (comment) (Pt requires 24/7 supervision at discharge, with follow up PT to maximize pt function, mobility, and safety)    Assistance Recommended at Discharge Frequent or constant Supervision/Assistance  Patient can return home with the following  A little help with walking and/or transfers;A little help with bathing/dressing/bathroom;Assistance with cooking/housework;Direct supervision/assist for financial management;Assist for transportation;Direct supervision/assist for medications management;Help with stairs or ramp for entrance    Equipment Recommendations  (RW vs rollator)  Recommendations for Other Services       Functional Status Assessment Patient has had a recent decline in their functional status and demonstrates the ability to make significant improvements in function in a reasonable and predictable amount of time.     Precautions / Restrictions Precautions Precautions: Fall Restrictions Weight Bearing Restrictions: No      Mobility  Bed Mobility Overal bed mobility: Needs Assistance Bed Mobility: Supine to Sit, Sit to Supine     Supine to sit: Supervision, HOB elevated Sit to supine: Supervision, HOB elevated        Transfers Overall transfer level: Needs assistance Equipment used: Rolling walker (2 wheels) Transfers: Sit to/from Stand Sit to Stand: Min guard                Ambulation/Gait   Gait Distance (Feet): 70 Feet Assistive device: Rolling walker (2 wheels)         General Gait Details: pt able to walk upright, with proper posture and RW positioning with extensive cueing. pt spontaneously returns to flexed trunk BUE support/forearms down  on RW. reported significant fatigue  Stairs            Wheelchair Mobility    Modified Rankin (Stroke Patients Only)       Balance Overall  balance assessment: Needs assistance Sitting-balance support: Feet supported Sitting balance-Leahy Scale: Fair       Standing balance-Leahy Scale: Poor                               Pertinent Vitals/Pain Pain Assessment Pain Assessment: Faces Faces Pain Scale: No hurt    Home Living Family/patient expects to be discharged to:: Private residence Living Arrangements: Spouse/significant other Available Help at Discharge: Family Type of Home: House         Home Layout: Two level   Additional Comments: pt states she lives with her husband, and has trouble going up the stairs    Prior Function Prior Level of Function : Patient poor historian/Family not available                     Hand Dominance        Extremity/Trunk Assessment   Upper Extremity Assessment Upper Extremity Assessment: Generalized weakness;Difficult to assess due to impaired cognition    Lower Extremity Assessment Lower Extremity Assessment: Difficult to assess due to impaired cognition;Generalized weakness       Communication   Communication: HOH  Cognition Arousal/Alertness: Awake/alert Behavior During Therapy: Impulsive, WFL for tasks assessed/performed Overall Cognitive Status: No family/caregiver present to determine baseline cognitive functioning                                 General Comments: oriented to self, location        General Comments      Exercises     Assessment/Plan    PT Assessment Patient needs continued PT services  PT Problem List Decreased strength;Decreased mobility;Decreased activity tolerance;Decreased balance;Decreased knowledge of use of DME;Decreased knowledge of precautions       PT Treatment Interventions DME instruction;Therapeutic exercise;Gait training;Balance training;Stair training;Neuromuscular re-education;Functional mobility training;Therapeutic activities;Patient/family education    PT Goals (Current goals can  be found in the Care Plan section)  Acute Rehab PT Goals Patient Stated Goal: to walk better PT Goal Formulation: With patient Time For Goal Achievement: 12/25/21 Potential to Achieve Goals: Good    Frequency Min 2X/week     Co-evaluation               AM-PAC PT "6 Clicks" Mobility  Outcome Measure Help needed turning from your back to your side while in a flat bed without using bedrails?: None Help needed moving from lying on your back to sitting on the side of a flat bed without using bedrails?: None Help needed moving to and from a bed to a chair (including a wheelchair)?: None Help needed standing up from a chair using your arms (e.g., wheelchair or bedside chair)?: None Help needed to walk in hospital room?: A Little Help needed climbing 3-5 steps with a railing? : A Little 6 Click Score: 22    End of Session Equipment Utilized During Treatment: Gait belt Activity Tolerance: Patient tolerated treatment well Patient left: in bed Nurse Communication: Mobility status PT Visit Diagnosis: Other abnormalities of gait and mobility (R26.89);Difficulty in walking, not elsewhere classified (R26.2)    Time: 1437-1500 PT Time Calculation (min) (ACUTE ONLY): 23 min  Charges:   PT Evaluation $PT Eval Low Complexity: 1 Low PT Treatments $Therapeutic Exercise: 8-22 mins      Lieutenant Diego PT, DPT 4:04 PM,12/11/21

## 2021-12-11 NOTE — Progress Notes (Signed)
Patient in room in bed with 1:1 sitter at bedside. No distress noted. Will continue to monitor for changes. Q 15 minute safety checks in place.

## 2021-12-11 NOTE — Progress Notes (Addendum)
Patient observed sitting in day room and using front wheel walker as directed. Pt is unsteady on her feet but is walking with moderate assistance. Pt denies anxiety and depression. Pt remains medication compliant. Pt is disoriented to situation. Pt voiced no other complaints to this Probation officer. Pt remains safe on the unit at this time. 1:1 orders still in place.

## 2021-12-11 NOTE — Progress Notes (Signed)
Recreation Therapy Notes    Date: 12/11/2021   Time: 1:15pm     Location: Courtyard        Behavioral response: N/A   Intervention Topic: Leisure    Discussion/Intervention: Patient refused to attend group.    Clinical Observations/Feedback:  Patient refused to attend group.    Jayvian Escoe LRT/CTRS        Velera Lansdale 12/11/2021 3:38 PM

## 2021-12-11 NOTE — Telephone Encounter (Signed)
[name] pt's daughter contacted ov, appointment was scheduled for today however phone call was not initiated. Pt is currently hospitalized. Spoke with [place] and provided information above. Pt's daughter would like for someone to return her phone call at [phone].

## 2021-12-11 NOTE — Plan of Care (Signed)
Patient is alert and oriented times 1. Mood and affect appropriate. Patient denies pain. She denies SI, HI, and AVH. Also denies feelings of anxiety and depression at this time. States she slept good last night. Morning meds given whole by mouth W/O difficulty. Ate breakfast in day room- appetite good. Patient remains on unit with Q15 minute checks in place.    Problem: Education: Goal: Knowledge of [place] [facility] Education information/materials will improve Outcome: Progressing Goal: Emotional status will improve Outcome: Progressing Goal: Mental status will improve Outcome: Progressing Goal: Verbalization of understanding the information provided will improve Outcome: Progressing   Problem: Activity: Goal: Interest or engagement in activities will improve Outcome: Progressing Goal: Sleeping patterns will improve Outcome: Progressing   Problem: Coping: Goal: Ability to verbalize frustrations and anger appropriately will improve Outcome: Progressing Goal: Ability to demonstrate self-control will improve Outcome: Progressing   Problem: Health Behavior/Discharge Planning: Goal: Identification of resources available to assist in meeting health care needs will improve Outcome: Progressing Goal: Compliance with treatment plan for underlying cause of condition will improve Outcome: Progressing   Problem: Physical Regulation: Goal: Ability to maintain clinical measurements within normal limits will improve Outcome: Progressing   Problem: Safety: Goal: Periods of time without injury will increase Outcome: Progressing   Problem: Education: Goal: Utilization of techniques to improve thought processes will improve Outcome: Progressing Goal: Knowledge of the prescribed therapeutic regimen will improve Outcome: Progressing   Problem: Activity: Goal: Interest or engagement in leisure activities will improve Outcome: Progressing Goal: Imbalance in normal sleep/wake cycle will  improve Outcome: Progressing   Problem: Coping: Goal: Coping ability will improve Outcome: Progressing Goal: Will verbalize feelings Outcome: Progressing   Problem: Health Behavior/Discharge Planning: Goal: Ability to make decisions will improve Outcome: Progressing Goal: Compliance with therapeutic regimen will improve Outcome: Progressing   Problem: Role Relationship: Goal: Will demonstrate positive changes in social behaviors and relationships Outcome: Progressing   Problem: Safety: Goal: Ability to disclose and discuss suicidal ideas will improve Outcome: Progressing Goal: Ability to identify and utilize support systems that promote safety will improve Outcome: Progressing   Problem: Self-Concept: Goal: Will verbalize positive feelings about self Outcome: Progressing Goal: Level of anxiety will decrease Outcome: Progressing

## 2021-12-12 ENCOUNTER — Ambulatory Visit: Payer: Medicare Other | Admitting: *Deleted

## 2021-12-12 DIAGNOSIS — M25552 Pain in left hip: Secondary | ICD-10-CM

## 2021-12-12 DIAGNOSIS — M797 Fibromyalgia: Secondary | ICD-10-CM

## 2021-12-12 DIAGNOSIS — I10 Essential (primary) hypertension: Secondary | ICD-10-CM

## 2021-12-12 DIAGNOSIS — E1169 Type 2 diabetes mellitus with other specified complication: Secondary | ICD-10-CM

## 2021-12-12 DIAGNOSIS — I700 Atherosclerosis of aorta: Secondary | ICD-10-CM

## 2021-12-12 DIAGNOSIS — M5416 Radiculopathy, lumbar region: Secondary | ICD-10-CM

## 2021-12-12 DIAGNOSIS — M5442 Lumbago with sciatica, left side: Secondary | ICD-10-CM

## 2021-12-12 DIAGNOSIS — M25551 Pain in right hip: Secondary | ICD-10-CM

## 2021-12-12 DIAGNOSIS — G609 Hereditary and idiopathic neuropathy, unspecified: Secondary | ICD-10-CM

## 2021-12-12 DIAGNOSIS — J441 Chronic obstructive pulmonary disease with (acute) exacerbation: Secondary | ICD-10-CM

## 2021-12-12 DIAGNOSIS — R4182 Altered mental status, unspecified: Secondary | ICD-10-CM | POA: Diagnosis not present

## 2021-12-12 DIAGNOSIS — F3181 Bipolar II disorder: Secondary | ICD-10-CM

## 2021-12-12 DIAGNOSIS — F0670 Mild neurocognitive disorder due to known physiological condition without behavioral disturbance: Secondary | ICD-10-CM

## 2021-12-12 DIAGNOSIS — E785 Hyperlipidemia, unspecified: Secondary | ICD-10-CM

## 2021-12-12 DIAGNOSIS — G8929 Other chronic pain: Secondary | ICD-10-CM

## 2021-12-12 DIAGNOSIS — J449 Chronic obstructive pulmonary disease, unspecified: Secondary | ICD-10-CM

## 2021-12-12 DIAGNOSIS — F315 Bipolar disorder, current episode depressed, severe, with psychotic features: Secondary | ICD-10-CM | POA: Diagnosis not present

## 2021-12-12 DIAGNOSIS — E1151 Type 2 diabetes mellitus with diabetic peripheral angiopathy without gangrene: Secondary | ICD-10-CM

## 2021-12-12 NOTE — Plan of Care (Signed)
Pt presents Calm and Cooperative on unit thus far.  Pt oriented to Self and Place. Patient form  Pt continues to need 1:1 monitoring and stand by assist when transferring. Pt did verbalize the date as June 1922.  Pt continues to make nonsensical statements.  Patient did comply with scheduled medications as ordered.  No adverse effects noted.  Problem: Education: Goal: Knowledge of  General Education information/materials will improve Outcome: Progressing Goal: Emotional status will improve Outcome: Progressing Goal: Mental status will improve Outcome: Progressing  Q 15 minute safety checks in place. Pt participates in therapeutic milieu. Plan of care will continue.

## 2021-12-12 NOTE — Progress Notes (Signed)
Patient placed on 1:1 no distress noted, resting in recliner patiently, she noted x 2 with period of confusion to time, and situation. Patient was frequently redirected and offered emotional support. Patient currently denies SI/HI/AVH 15 minutes safety checks maintained will continue to monitor

## 2021-12-12 NOTE — Progress Notes (Signed)
Recreation Therapy Notes  Date: 12/12/2021  Time: 10:50 am    Location: Craft room    Behavioral response: N/A   Intervention Topic: Coping skills   Discussion/Intervention: Patient refused to attend group.   Clinical Observations/Feedback:  Patient refused to attend group.   Evony Rezek LRT/CTRS         Cadynce Garrette 12/12/2021 12:04 PM

## 2021-12-12 NOTE — Progress Notes (Signed)
[facility] MD Progress Note  12/12/2021 11:39 AM Megan Duncan  MRN:  [id number] Subjective:  Megan Duncan had a visit from physical therapy yesterday.  She has a lot of back pain and it is difficult for her to transfer but she is trying.  Hopefully, they will continue to work with her right now the recommendation is constant supervision and assistance and hopefully that will improve.  In the meantime, she seems to be doing well on her medications.  Sleep and appetite is good vital signs are stable.  No side effects from her medication.  Her mood is good.  Principal Problem: Altered mental status Diagnosis: Principal Problem:   Altered mental status  Total Time spent with patient: 15 minutes  Past Psychiatric History: Bipolar Disorder  Past Medical History:  Past Medical History:  Diagnosis Date   Acute pain of right shoulder 09/24/2018   Acute upper respiratory infection 09/19/2010   Allergic rhinitis 10/03/2016   Anterolisthesis of lumbar spine    Arthritis    hands   Bilateral hip pain 09/02/2014   Bipolar II disorder    Cellulitis and abscess of trunk 09/15/2009   Ceruminosis 03/04/2014   Cervicalgia 06/20/2007   Chronic low back pain 06/20/2007   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-sided sciatica 08/06/2012   COPD (chronic obstructive pulmonary disease) ([place])    Deformity of finger 07/15/2014   Degeneration of lumbar intervertebral disc 08/22/2020   Degenerative lumbar spinal stenosis    Dermatitis 39/76/7341   Diastolic dysfunction    Per pt, diagnosed after [facility]   Diverticulosis    DOE (dyspnea on exertion) 07/20/2016   Essential (primary) hypertension 04/23/2007   Estrogen deficiency 09/01/2017   External hemorrhoid 08/01/2015   Fibromyalgia    Foraminal stenosis of lumbar region 08/22/2020   Generalized anxiety disorder 09/01/2018   Hyperlipidemia    Inflammatory disease of uterus 04/23/2007   Left flank pain 07/13/2021   Left upper quadrant pain 09/01/2017    Lower abdominal pain 08/06/2012   Lumbar back pain with radiculopathy affecting left lower extremity 01/24/2010   Lumbar radiculopathy 01/26/2021   Lump in upper inner quadrant of left breast 07/13/2021   Major depressive disorder    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   Otitis media 09/05/2010   Palpitations 07/20/2016   Peripheral neuropathy 02/22/2010   Retrolisthesis of vertebrae 08/22/2020   Shingles outbreak 04/14/2015   Sinusitis, acute maxillary 09/14/2013   Stress incontinence, female 08/01/2015   Thoracic aortic atherosclerosis 05/14/2017   Tinea corporis 09/01/2017   Tinnitus 03/04/2014   Torticollis 03/04/2014   Type II diabetes mellitus 06/05/2018    Past Surgical History:  Procedure Laterality Date   APPENDECTOMY     EYE SURGERY     KNEE ARTHROSCOPY Right 11/12/2006   TONSILLECTOMY     TOTAL ABDOMINAL HYSTERECTOMY     Family History:  Family History  Problem Relation Age of Onset   Hypertension Mother    Hiatal hernia Mother    Diabetes Father    Hypertension Father    Heart attack Father    Alcoholism Father    Bipolar disorder Father    Mental illness Sister    Suicidality Sister    Colon cancer Maternal Aunt 71   Breast cancer Maternal Aunt    Bipolar disorder Paternal Aunt    Family Psychiatric  History: Sister committed suicide Social History:  Social History   Substance and Sexual Activity  Alcohol Use Yes   Alcohol/week:  0.0 - 1.0 standard drinks   Comment: occasional wine     Social History   Substance and Sexual Activity  Drug Use No    Social History   Socioeconomic History   Marital status: Married    Spouse name: Ennifer [place]   Number of children: 3   Years of education: 14   Highest education level: Some college, no degree  Occupational History   Occupation: retired  Tobacco Use   Smoking status: Former    Packs/day: 1.50    Years: 40.00    Pack years: 60.00    Types: Cigarettes    Quit  date: 07/02/2004    Years since quitting: 17.4    Passive exposure: Past   Smokeless tobacco: Never   Tobacco comments:    Verified by Aldona Lento (Husband), & Lauralee Evener (Daughter)  Vaping Use   Vaping Use: Never used  Substance and Sexual Activity   Alcohol use: Yes    Alcohol/week: 0.0 - 1.0 standard drinks    Comment: occasional wine   Drug use: No   Sexual activity: Never  Other Topics Concern   Not on file  Social History Narrative   Lives with husband and grandson she is raising.   11/07/21 Lives with husband   Social Determinants of Health   Financial Resource Strain: Medium Risk   Difficulty of Paying Living Expenses: Somewhat hard  Food Insecurity: No Food Insecurity   Worried About Charity fundraiser in the Last Year: Never true   Ran Out of Food in the Last Year: Never true  Transportation Needs: No Transportation Needs   Lack of Transportation (Medical): No   Lack of Transportation (Non-Medical): No  Physical Activity: Inactive   Days of Exercise per Week: 0 days   Minutes of Exercise per Session: 0 min  Stress: No Stress Concern Present   Feeling of Stress : Not at all  Social Connections: Socially Integrated   Frequency of Communication with Friends and Family: More than three times a week   Frequency of Social Gatherings with Friends and Family: More than three times a week   Attends Religious Services: 1 to 4 times per year   Active Member of [employer] or Organizations: Yes   Attends Music therapist: More than 4 times per year   Marital Status: Married   Additional Social History:                         Sleep: Good  Appetite:  Good  Current Medications: Current Facility-Administered Medications  Medication Dose Route Frequency Provider Last Rate Last Admin   acetaminophen (TYLENOL) tablet 650 mg  650 mg Oral Q6H PRN Deloria Lair, NP   650 mg at 12/12/21 0928   alum & mag hydroxide-simeth (MAALOX/MYLANTA) 200-200-20  MG/5ML suspension 30 mL  30 mL Oral Q4H PRN Deloria Lair, NP       chlorproMAZINE (THORAZINE) tablet 25 mg  25 mg Oral QHS Parks Ranger, DO   25 mg at 12/11/21 2102   donepezil (ARICEPT) tablet 5 mg  5 mg Oral QHS Parks Ranger, DO   5 mg at 12/11/21 2103   DULoxetine (CYMBALTA) DR capsule 60 mg  60 mg Oral Daily Dixon, Rashaun M, NP   60 mg at 12/12/21 0900   haloperidol (HALDOL) tablet 1 mg  1 mg Oral [id number] Parks Ranger, DO   1 mg at 12/12/21 0900  levothyroxine (SYNTHROID) tablet 50 mcg  50 mcg Oral QAC breakfast Doren Custard, Rashaun M, NP   50 mcg at 12/12/21 0654   lidocaine (LIDODERM) 5 % 1 patch  1 patch Transdermal Daily PRN Rulon Sera, MD       LORazepam (ATIVAN) tablet 1 mg  1 mg Oral Q4H PRN Parks Ranger, DO   1 mg at 12/11/21 2103   magnesium hydroxide (MILK OF MAGNESIA) suspension 30 mL  30 mL Oral Daily PRN Deloria Lair, NP       meloxicam (MOBIC) tablet 7.5 mg  7.5 mg Oral Daily Parks Ranger, DO   7.5 mg at 12/12/21 0901   OLANZapine (ZYPREXA) tablet 5 mg  5 mg Oral Q4H PRN Parks Ranger, DO   5 mg at 12/05/21 2108   propranolol (INDERAL) tablet 20 mg  20 mg Oral TID Deloria Lair, NP   20 mg at 12/12/21 0900    Lab Results: No results found for this or any previous visit (from the past 48 hour(s)).  Blood Alcohol level:  Lab Results  Component Value Date   ETH <10 02/77/4128    Metabolic Disorder Labs: Lab Results  Component Value Date   HGBA1C 6.4 10/27/2021   No results found for: PROLACTIN Lab Results  Component Value Date   CHOL 186 10/19/2021   TRIG 140.0 10/19/2021   HDL 70.90 10/19/2021   CHOLHDL 3 10/19/2021   VLDL 28.0 10/19/2021   LDLCALC 87 10/19/2021   LDLCALC 99 09/22/2020    Physical Findings: AIMS:  , ,  ,  ,    CIWA:    COWS:     Musculoskeletal: Strength & Muscle Tone: decreased Gait & Station: unable to stand Patient leans: N/A  Psychiatric Specialty  Exam:  Presentation  General Appearance: Bizarre; Disheveled  Eye Contact:Minimal  Speech:Pressured  Speech Volume:Increased  Handedness:Right   Mood and Affect  Mood:Angry; Depressed; Dysphoric; Hopeless  Affect:Depressed; Tearful; Full Range   Thought Process  Thought Processes:Disorganized; Irrevelant  Descriptions of Associations:Loose  Orientation:Partial  Thought Content:Delusions; Illogical; Scattered; Paranoid Ideation  History of Schizophrenia/Schizoaffective disorder:No  Duration of Psychotic Symptoms:N/A  Hallucinations:No data recorded Ideas of Reference:Delusions; Paranoia  Suicidal Thoughts:No data recorded Homicidal Thoughts:No data recorded  Sensorium  Memory:Remote Poor  Judgment:Impaired  Insight:None   Executive Functions  Concentration:Poor  Attention Span:Poor  Recall:Poor  Fund of Knowledge:Poor  Language:Poor   Psychomotor Activity  Psychomotor Activity:No data recorded  Assets  Assets:Financial Resources/Insurance; Housing; Intimacy; Social Support   Sleep  Sleep:No data recorded   Physical Exam: Physical Exam Vitals and nursing note reviewed.  Constitutional:      Appearance: Normal appearance. She is normal weight.  Neurological:     General: No focal deficit present.     Mental Status: She is alert and oriented to person, place, and time.  Psychiatric:        Attention and Perception: Attention and perception normal.        Mood and Affect: Mood is anxious.        Speech: Speech is tangential.        Behavior: Behavior normal. Behavior is cooperative.        Thought Content: Thought content normal.        Cognition and Memory: Cognition is impaired. Memory is impaired.        Judgment: Judgment normal.   Review of Systems  Constitutional: Negative.   HENT: Negative.    Eyes: Negative.   Respiratory:  Negative.    Cardiovascular: Negative.   Gastrointestinal: Negative.   Genitourinary: Negative.    Musculoskeletal: Negative.   Skin: Negative.   Neurological: Negative.   Endo/Heme/Allergies: Negative.   Psychiatric/Behavioral:  Positive for depression and memory loss. The patient is nervous/anxious.   Blood pressure 127/89, pulse 78, temperature 98.1 F (36.7 C), temperature source Oral, resp. rate 18, height 5\' 2"  (1.575 m), weight 86.2 kg, SpO2 97 %. Body mass index is 34.75 kg/m.   Treatment Plan Summary: Daily contact with patient to assess and evaluate symptoms and progress in treatment, Medication management, and Plan continue current medications.  Hopefully, continue physical therapy.  Parks Ranger, DO 12/12/2021, 11:39 AM

## 2021-12-12 NOTE — Progress Notes (Addendum)
Pt lying in bed with eyes open; calm, cooperative. Pt apologized for her behavior when she was first admitted to the unit and states "I feel like I'm at the very beginning to getting myself back." Pt denies pain and SI/HI/AH at this time; however, she endorses visual hallucinations, stating "I am hallucinating. On my right hand side, I see register tape from a department store, like [employer] or [employer], and they just kind of go in the air. Then I'll look again and it's gone." She states that it (the hallucination) just happened tonight. She reports that she sleeps "just fine" and says "I've been eating well" when asked about her appetite. Pt denies anxiety at this time and when asked if she is depressed, she states "I don't know if I can answer that or not; so much has happened." Pt appeared to have mild dysphasia during assessment and acknowledged that she could not find the right words at times. No acute distress noted.

## 2021-12-12 NOTE — Patient Instructions (Signed)
Visit Information  Thank you for taking time to visit with me today. Please don't hesitate to contact me if I can be of assistance to you before our next scheduled telephone appointment.  Following are the goals we discussed today:  Patient Goals/Self-Care Activities:  Work with LCSW on a bi-weekly basis, in an effort to coordinate hours of in-home care services through long-term care insurance policy.   Husband agreed to contact long-term care insurance policy through [employer], and inquire about the following list of questions:  ~ How many hours of in-home care services are covered per day, per week, per month, per fiscal year?   Policy pays for $233.43 per month of in-home care services.  74 Day Activation Period.  ~ Is Mrs. Melcher able to utilize Mr. Beber's hours of in-home care services, if hours are exhausted under her policy?    Yes.  ~ Request list of preferred/in-network in-home care providers covered under policy.  Policy allows for any in-home care agency of interest.   ~ Out-of-pocket expenses, if any?   None ~ What types of in-home care services are covered under policy (I.e meal preparation, light housekeeping duties, bathing, dressing, transportation, grocery shopping, medication administration and monitoring, laundry, etc.).   All of the Above. Continue to consider [place] referral to [place] and [employer] [facility] [place] [facility], placed by [employer], [facility] [facility], on 11/27/2021. LCSW collaboration with Kiva [place], [place] with [facility] to report that you, nor your daughter, are interested in returning to [place] for psychotropic medication management and/or psychotherapeutic services, and to confirm that follow-up appointments will be made for  you with psychiatry and therapy, prior to discharge. Please contact LCSW directly (# [phone]) if you have questions, need assistance, or if additional social work needs are identified between now and our next scheduled telephone outreach call.  Follow-Up Plan:  12/19/2021 at 3:00 pm  Please call the care guide team at [phone] if you need to cancel or reschedule your appointment.   If you are experiencing a Mental Health or [place] or need someone to talk to, please call the Suicide and Crisis Lifeline: 988 call the [place] National Suicide Prevention Lifeline: (253)141-7124 or TTY: 657-306-3609 TTY 314-445-0511) to talk to a trained counselor call 1-800-273-TALK (toll free, 24 hour hotline) go to [facility] [address], [place] [phone]) call the [place]: [phone] call 911   Patient verbalizes understanding of instructions and care plan provided today and agrees to view in [place]. Active MyChart status confirmed with patient.    [place] Licensed Clinical Social Worker [facility] [employer] [phone]

## 2021-12-12 NOTE — Chronic Care Management (AMB) (Signed)
Chronic Care Management    Clinical Social Work Note  12/12/2021 Name: Megan Duncan MRN: [id number] DOB: March 05, 1946  Lanelle Bal Wunder is a 76 y.o. year old female who is a primary care patient of Ann Held, DO. The CCM team was consulted to assist the patient with chronic disease management and/or care coordination needs related to: [employer], Level of Care Concerns, Mental Health Counseling and Resources, and Caregiver Stress.   Engaged with patient's husband and daughter by telephone for follow up visit in response to provider referral for social work chronic care management and care coordination services.   Consent to Services:  The patient was given information about Chronic Care Management services, agreed to services, and gave verbal consent prior to initiation of services.  Please see initial visit note for detailed documentation.   Patient agreed to services and consent obtained.   Assessment: Review of patient past medical history, allergies, medications, and health status, including review of relevant consultants reports was performed today as part of a comprehensive evaluation and provision of chronic care management and care coordination services.     SDOH (Social Determinants of Health) assessments and interventions performed:    Advanced Directives Status: Not addressed in this encounter.  CCM Care Plan  Allergies  Allergen Reactions   Atorvastatin Other (See Comments)    Significant rise in liver tests   Codeine Nausea And Vomiting   Diclofenac Other (See Comments)    Elevated LFT's   Doxycycline     Extreme nausea   Sulfonamide Derivatives Swelling    Facility-Administered Encounter Medications as of 12/12/2021  Medication   acetaminophen (TYLENOL) tablet 650 mg   alum & mag hydroxide-simeth (MAALOX/MYLANTA) 200-200-20 MG/5ML suspension 30 mL   chlorproMAZINE (THORAZINE) tablet 25 mg   donepezil (ARICEPT) tablet 5 mg   DULoxetine  (CYMBALTA) DR capsule 60 mg   haloperidol (HALDOL) tablet 1 mg   levothyroxine (SYNTHROID) tablet 50 mcg   lidocaine (LIDODERM) 5 % 1 patch   LORazepam (ATIVAN) tablet 1 mg   magnesium hydroxide (MILK OF MAGNESIA) suspension 30 mL   meloxicam (MOBIC) tablet 7.5 mg   OLANZapine (ZYPREXA) tablet 5 mg   propranolol (INDERAL) tablet 20 mg   Outpatient Encounter Medications as of 12/12/2021  Medication Sig   albuterol (VENTOLIN HFA) 108 (90 Base) MCG/ACT inhaler Inhale 2 puffs into the lungs every 4 (four) hours as needed. For shortness of breath and wheezing   ALPRAZolam (XANAX) 0.5 MG tablet Take 30 min prior to procedure   amLODipine (NORVASC) 5 MG tablet TAKE 1 TABLET (5 MG TOTAL) BY MOUTH DAILY.   blood glucose meter kit and supplies Dispense based on patient and insurance preference. Use once a day.  DX Code: E11.9   cephALEXin (KEFLEX) 500 MG capsule Take 1 capsule (500 mg total) by mouth 2 (two) times daily.   clorazepate (TRANXENE) 3.75 MG tablet Take 1 tablet (3.75 mg total) by mouth 2 (two) times daily as needed for anxiety or sleep.   DULoxetine (CYMBALTA) 30 MG capsule Take 1 capsule (30 mg total) by mouth daily. For 2 weeks, then start 60 mg daily.   DULoxetine (CYMBALTA) 60 MG capsule TAKE 1 CAPSULE BY MOUTH EVERY DAY IN THE MORNING, after taking 30 mg for 2 weeks.   glucose blood test strip Use as instructed once a day.  Dx Code: E11.9   hydrocortisone-pramoxine (ANALPRAM HC) 2.5-1 % rectal cream Place 1 application rectally 3 (three) times daily.   Lancets  MISC Use as directed once a day.  Dx code: E11.9   levothyroxine (SYNTHROID) 50 MCG tablet TAKE 1 TABLET BY MOUTH EVERY DAY BEFORE BREAKFAST   lithium carbonate 150 MG capsule TAKE 1 CAPSULE (150 MG TOTAL) BY MOUTH DAILY, TAKE WITH LITHIUM 600 MG TO EQUAL 750 MG TOTAL   lithium carbonate 300 MG capsule TAKE 2 CAPSULES (600 MG) BY MOUTH AT BEDTIME, WITH 150 MG CAPSULE   metFORMIN (GLUCOPHAGE) 1000 MG tablet Take 1 tablet (1,000  mg total) by mouth 2 (two) times daily with a meal.   ondansetron (ZOFRAN) 4 MG tablet Take 1 tablet (4 mg total) by mouth every 8 (eight) hours as needed for nausea or vomiting.   propranolol (INDERAL) 20 MG tablet TAKE 1 TABLET BY MOUTH THREE TIMES A DAY   QUEtiapine (SEROQUEL) 50 MG tablet Take 1 tablet (50 mg total) by mouth at bedtime.   traZODone (DESYREL) 50 MG tablet TAKE 1/2 TO 2 TABLETS BY MOUTH EVERY DAY AT BEDTIME AS NEEDED FOR SLEEP    Patient Active Problem List   Diagnosis Date Noted   Altered mental status 12/03/2021   Anxiety 11/28/2021   Memory loss 10/26/2021   Bilateral hearing loss 10/26/2021   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   Degenerative lumbar spinal stenosis    Anterolisthesis of lumbar spine    Lump in upper inner quadrant of left breast 07/13/2021   Left flank pain 07/13/2021   Lumbar radiculopathy 01/26/2021   Body mass index (BMI) 36.0-36.9, adult 01/11/2021   Degeneration of lumbar intervertebral disc 08/22/2020   Retrolisthesis of vertebrae 08/22/2020   Foraminal stenosis of lumbar region 08/22/2020   Generalized anxiety disorder 09/01/2018   Type II diabetes mellitus 06/05/2018   Tinea corporis 09/01/2017   Left upper quadrant pain 09/01/2017   Estrogen deficiency 09/01/2017   Thoracic aortic atherosclerosis 05/14/2017   COPD (chronic obstructive pulmonary disease) 10/30/2016   Allergic rhinitis 10/03/2016   Palpitations 07/20/2016   DOE (dyspnea on exertion) 07/20/2016   External hemorrhoid 08/01/2015   Stress incontinence, female 08/01/2015   Dermatitis 06/02/2015   Shingles outbreak 04/14/2015   Hyperlipidemia associated with type 2 diabetes mellitus ([place])    Fibromyalgia 10/14/2014   Bilateral hip pain 09/02/2014   Deformity of finger 07/15/2014   Ceruminosis 03/04/2014   Tinnitus 03/04/2014   Torticollis 03/04/2014   Bipolar 2 disorder 12/03/2013   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-sided  sciatica 08/06/2012   Lower abdominal pain 08/06/2012   Otitis media 09/05/2010   Peripheral neuropathy 02/22/2010   Lumbar back pain with radiculopathy affecting left lower extremity 01/24/2010   Cervicalgia 06/20/2007   Chronic low back pain 06/20/2007   Essential (primary) hypertension 04/23/2007   Inflammatory disease of uterus 04/23/2007    Conditions to be addressed/monitored: Anxiety and Depression.  Limited Social Support, Level of Care Concerns, ADL/IADL Limitations, Mental Health Concerns, Limited Access to Caregiver, Cognitive Deficits, Memory Deficits, and Lacks Knowledge of [employer].  Care Plan : LCSW Plan of Care  Updates made by Francis Gaines, LCSW since 12/12/2021 12:00 AM     Problem: Improve Quality of Life through [facility].   Priority: High     Goal: Improve Quality of Life through [facility].   Start Date: 11/27/2021  Expected End Date: 01/25/2022  This Visit's Progress: On track  Recent Progress: On track  Priority: High  Note:   Current Barriers:   Patient with Thoracic Aortic Atherosclerosis, Essential  Hypertension, Chronic Obstructive Pulmonary Disease, Type II Diabetes Mellitus, Mild Neurocognitive Disorder, Bilateral Hearing Loss, Memory Loss, Fibromyalgia, Bipolar II Disorder, Generalized Anxiety Disorder, and Caregiver Stress needs Support, Education, Resources, Referrals, Advocacy, and Care Coordination to resolve unmet personal care needs in the home. Patient is unable to self-administer medications as prescribed, or consistently perform ADL's/IADL's independently. Lacks knowledge of available community agencies and resources. Clinical Goals:  Patient, husband, and daughter will work with LCSW, in an effort to coordinate in-home care services, through [employer].   Patient will demonstrate improved health management independence, as evidenced by having in-home care services in place. Clinical  Interventions: Collaboration with Primary Care Physician, Dr. Roma Schanz regarding development and update of comprehensive plan of care, as evidenced by provider attestation and co-signature. Inter-disciplinary care team collaboration (see longitudinal plan of care). Interventions performed:  Problem Solving/Task Centered, Psychoeducation/Health Education, Quality of Sleep Assessed and Sleep Hygiene Techniques Promoted, Caregiver Stress Acknowledged and Consideration of In-Home Care Services Encouraged. Discussed plans with patient, husband, and daughter for ongoing care management follow-up and provided direct contact information for care management team. Assessed needs, level of care concerns, basic eligibility and provided education on process for identifying coverage hours and preferred agency providers through long-term care insurance policy. Identified resources and durable medical equipment needed in the home to improve safety and promote independence. Patient Goals/Self-Care Activities:  Work with LCSW on a bi-weekly basis, in an effort to coordinate hours of in-home care services through long-term care insurance policy.   Husband agreed to contact long-term care insurance policy through [employer], and inquire about the following list of questions:  ~ How many hours of in-home care services are covered per day, per week, per month, per fiscal year?   Policy pays for $659.93 per month of in-home care services.  44 Day Activation Period.  ~ Is Mrs. Benito able to utilize Mr. Ingles's hours of in-home care services, if hours are exhausted under her policy?    Yes.  ~ Request list of preferred/in-network in-home care providers covered under policy.  Policy allows for any in-home care agency of interest.   ~ Out-of-pocket expenses, if any?   None ~ What types of in-home care services are covered under policy (I.e meal preparation, light  housekeeping duties, bathing, dressing, transportation, grocery shopping, medication administration and monitoring, laundry, etc.).   All of the Above. Continue to consider [place] referral to [facility] and [employer] [facility] [place] [facility], placed by [employer], [employer], on 11/27/2021. LCSW collaboration with [facility] [place], [place] with [facility] to report that you, nor your daughter, are interested in returning to [facility] for psychotropic medication management and/or psychotherapeutic services, and to confirm that follow-up appointments will be made for you with psychiatry and therapy, prior to discharge. Please contact LCSW directly (# [phone]) if you have questions, need assistance, or if additional social work needs are identified between now and our next scheduled telephone outreach call.  Follow-Up Plan:  12/19/2021 at 3:00 pm      Nat Christen [place] Clinical Social Worker [place] [phone]

## 2021-12-12 NOTE — Plan of Care (Signed)
°  Problem: Group Participation Goal: STG - Patient will engage in groups with a calm and appropriate mood at least 2x within 5 recreation therapy group sessions Description: STG - Patient will engage in groups with a calm and appropriate mood at least 2x within 5 recreation therapy group sessions Outcome: Not Progressing

## 2021-12-13 DIAGNOSIS — F315 Bipolar disorder, current episode depressed, severe, with psychotic features: Secondary | ICD-10-CM | POA: Diagnosis not present

## 2021-12-13 DIAGNOSIS — R4182 Altered mental status, unspecified: Secondary | ICD-10-CM | POA: Diagnosis not present

## 2021-12-13 NOTE — Progress Notes (Signed)
1:1 Note   12/13/21 0805 12/13/21 1710 12/13/21 2011  Vital Signs  Temp 98 F (36.7 C) 98 F (36.7 C) 98.1 F (36.7 C)  Temp Source Oral Oral  --   Pulse Rate 81 62 60  Pulse Rate Source  --  Monitor Monitor  Resp 20 18 18   BP (!) 110/91 (!) 111/55 (!) 114/59  BP Location Right Arm Left Arm Left Arm  BP Method Automatic Automatic Automatic  Patient Position (if appropriate) Sitting Sitting Sitting   Propranolol 20 mg tab held at 2200. Patient offered and encouraged PO fluids. Patient awake at 0130 disoriented to time and place trying to walk c/o "I am in the boys room I want to go to my room" Patient redirectable and reoriented to time and place. Patient is pleasant c/o of feeling anxious. Prn ativan 1 mg given at 0159.   Support and encouragement provided. Patient remains safe.

## 2021-12-13 NOTE — Group Note (Signed)
[facility] LCSW Group Therapy Note   Group Date: 12/13/2021 Start Time: 1400 End Time: 1445   Type of Therapy/Topic:  Group Therapy:  Emotion Regulation  Participation Level:  Did Not Attend   Mood:  Description of Group:    The purpose of this group is to assist patients in learning to regulate negative emotions and experience positive emotions. Patients will be guided to discuss ways in which they have been vulnerable to their negative emotions. These vulnerabilities will be juxtaposed with experiences of positive emotions or situations, and patients challenged to use positive emotions to combat negative ones. Special emphasis will be placed on coping with negative emotions in conflict situations, and patients will process healthy conflict resolution skills.  Therapeutic Goals: Patient will identify two positive emotions or experiences to reflect on in order to balance out negative emotions:  Patient will label two or more emotions that they find the most difficult to experience:  Patient will be able to demonstrate positive conflict resolution skills through discussion or role plays:   Summary of Patient Progress:   X    Therapeutic Modalities:   Cognitive Behavioral Therapy Feelings Identification Dialectical Behavioral Therapy   Raven Harmes A [place], LCSWA

## 2021-12-13 NOTE — Progress Notes (Signed)
Patient in day room eating lunch. Sitter at next to her. Patient does not appear to be in any stress. Q 15 minute safety checks continued.

## 2021-12-13 NOTE — BHH Counselor (Signed)
CSW spoke with pt's daughter, Pincus Large, [phone], regarding pt. Care.   She stated that she was working with [place] at [employer], [place], who would reach out to [facility] to coordinate follow up care and provide collateral information. She stated that Di Kindle made an appointment with pt for 12/19/21 for outpatient follow up. CSW will assist in rescheduling appointment if needed.   She also stated that pt can continue with [facility] due to history pt has with facility and CSW will assist in scheduling an appointment for follow up.   CSW will reach out to [facility] to follow up on any discharge planning for pt.   Krish Bailly [place], MSW, LCSW-A 2/1/20234:41 PM

## 2021-12-13 NOTE — Progress Notes (Signed)
Patient in day room watching TV. Sitter next to her. Patient does not appear to be in any stress. Q 15 minute safety checks continued.

## 2021-12-13 NOTE — Progress Notes (Signed)
Patients daughter called asking for updates on the patient as well as complaining about wanting to speak with the doctor. This Probation officer informed daughter that I had not seen many changes since Monday, but she has been working with therapy and feeding herself. Daughter then asked if the patient had a shower today and I informed her that I would have to check with the techs. At this point the daughter began to present a nasty disposition....asking "how many patients are on the unit, how many patients am I caring for, and what is my exact job". I let her know those things were none of her business and I would let the charge nurse further assist her.

## 2021-12-13 NOTE — Progress Notes (Signed)
1:1 NOTE  Patient compliant with medications denies SI/HI/A/VH and verbally contracted for safety. Patient stated she enjoyed visit with her daughter this evening.   Continued on 1:1 Observation and fall protocol in place. Patient remains safe. No adverse drug noted. Support and encouragement provided.

## 2021-12-13 NOTE — Progress Notes (Signed)
Recreation Therapy Notes    Date: 12/13/2021   Time: 1:15pm    Location: Craft room     Behavioral response: N/A   Intervention Topic: Strengths     Discussion/Intervention: Patient refused to attend group.   Clinical Observations/Feedback:  Patient refused to attend group.   Merrit Friesen LRT/CTRS        Duc Crocket 12/13/2021 1:35 PM

## 2021-12-13 NOTE — Progress Notes (Signed)
Physical Therapy Treatment Patient Details Name: Megan Duncan MRN: [id number] DOB: 04-14-1946 Today's Date: 12/13/2021   History of Present Illness Pt is a 76 yo female that presented to the ED due to concerns of AMS, hallucinations, recent multiple ED visits. PMH of bipolar disorder, cervicalagia, chronic LBP, COPD, fibromyalgia, dementia.    PT Comments    Patient very pleasant, oriented to self, place, some insight to situation noted but pt reported "something big must of happened for me to be here". Session focused on promoting strengthening and endurance. She was able to ambulate 60ft twice, with RW and CGA. Improved upright tolerance, command following and endurance noted from previous session. She was able to participate in seated LE exercises with constant multimodal cues and PT demonstration, including bilateral piriformis stretching. Pt returned to group room with all needs in reach. The patient would benefit from further skilled PT intervention to continue to progress towards goals. Recommendation remains appropriate.     Recommendations for follow up therapy are one component of a multi-disciplinary discharge planning process, led by the attending physician.  Recommendations may be updated based on patient status, additional functional criteria and insurance authorization.  Follow Up Recommendations  Other (comment) (Pt requires 24/7 supervision at discharge, with follow up PT to maximize pt function, mobility, and safety)     Assistance Recommended at Discharge Frequent or constant Supervision/Assistance  Patient can return home with the following A little help with walking and/or transfers;A little help with bathing/dressing/bathroom;Assistance with cooking/housework;Direct supervision/assist for financial management;Assist for transportation;Direct supervision/assist for medications management;Help with stairs or ramp for entrance   Equipment Recommendations  Other (comment)  (RW vs rollator)    Recommendations for Other Services       Precautions / Restrictions Precautions Precautions: Fall Restrictions Weight Bearing Restrictions: No     Mobility  Bed Mobility               General bed mobility comments: pt up in group room at start and end of session    Transfers Overall transfer level: Needs assistance Equipment used: Rolling walker (2 wheels) Transfers: Sit to/from Stand Sit to Stand: Supervision           General transfer comment: does not use hands appropriately on RW    Ambulation/Gait   Gait Distance (Feet):  (40ft, and then additional 35ft after resting in sitting) Assistive device: Rolling walker (2 wheels)         General Gait Details: pt able to walk upright, with proper posture and RW positioning with cueing.improved tolerance noted today   Stairs             Wheelchair Mobility    Modified Rankin (Stroke Patients Only)       Balance Overall balance assessment: Needs assistance Sitting-balance support: Feet supported Sitting balance-Leahy Scale: Good       Standing balance-Leahy Scale: Fair                              Cognition Arousal/Alertness: Awake/alert Behavior During Therapy: WFL for tasks assessed/performed Overall Cognitive Status: No family/caregiver present to determine baseline cognitive functioning                                 General Comments: oriented to self, location        Exercises Other Exercises Other Exercises: seated marching, LAQ, heel  raises x20 bilaterally. breaks in between sets as needed, pt verbose. also instructed in sitting piriformis stretch (2x45sec bilaterally), and upright posture in sitting    General Comments        Pertinent Vitals/Pain Pain Assessment Faces Pain Scale: No hurt    Home Living                          Prior Function            PT Goals (current goals can now be found in the care  plan section) Progress towards PT goals: Progressing toward goals    Frequency    Min 2X/week      PT Plan Current plan remains appropriate    Co-evaluation              AM-PAC PT "6 Clicks" Mobility   Outcome Measure  Help needed turning from your back to your side while in a flat bed without using bedrails?: None Help needed moving from lying on your back to sitting on the side of a flat bed without using bedrails?: None Help needed moving to and from a bed to a chair (including a wheelchair)?: None Help needed standing up from a chair using your arms (e.g., wheelchair or bedside chair)?: None Help needed to walk in hospital room?: A Little Help needed climbing 3-5 steps with a railing? : A Little 6 Click Score: 22    End of Session Equipment Utilized During Treatment: Gait belt Activity Tolerance: Patient tolerated treatment well Patient left: Other (comment) (in group room with sitter) Nurse Communication: Mobility status PT Visit Diagnosis: Other abnormalities of gait and mobility (R26.89);Difficulty in walking, not elsewhere classified (R26.2)     Time: 5997-7414 PT Time Calculation (min) (ACUTE ONLY): 18 min  Charges:  $Therapeutic Exercise: 8-22 mins                     Lieutenant Diego PT, DPT 9:28 AM,12/13/21

## 2021-12-13 NOTE — Progress Notes (Signed)
[facility] MD Progress Note  12/13/2021 1:25 PM Megan Duncan  MRN:  [id number] Subjective: Megan Duncan is slowly getting better.  She still confused but she is oriented to place and person.  She needed some prompting as to the date.  She did know her birthday.  She has been working with physical therapy and improving with ambulation.  Her blood pressure was a little bit low yesterday so we held her propranolol.  She is getting better at carrying on a regular conversation but still becomes tangential in her thought process.  She is smiling and in a good mood.  She apparently had some visual hallucinations last night.  She apparently slept through the night.  She denies any side effects from her medications and there is no EPS or TD.  She did get some Ativan last night.  Principal Problem: Altered mental status Diagnosis: Principal Problem:   Altered mental status  Total Time spent with patient: 15 minutes  Past Psychiatric History: Bipolar Disorder  Past Medical History:  Past Medical History:  Diagnosis Date   Acute pain of right shoulder 09/24/2018   Acute upper respiratory infection 09/19/2010   Allergic rhinitis 10/03/2016   Anterolisthesis of lumbar spine    Arthritis    hands   Bilateral hip pain 09/02/2014   Bipolar II disorder    Cellulitis and abscess of trunk 09/15/2009   Ceruminosis 03/04/2014   Cervicalgia 06/20/2007   Chronic low back pain 06/20/2007   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-sided sciatica 08/06/2012   COPD (chronic obstructive pulmonary disease) ([place])    Deformity of finger 07/15/2014   Degeneration of lumbar intervertebral disc 08/22/2020   Degenerative lumbar spinal stenosis    Dermatitis 32/67/1245   Diastolic dysfunction    Per pt, diagnosed after [facility]   Diverticulosis    DOE (dyspnea on exertion) 07/20/2016   Essential (primary) hypertension 04/23/2007   Estrogen deficiency 09/01/2017   External hemorrhoid 08/01/2015   Fibromyalgia    Foraminal  stenosis of lumbar region 08/22/2020   Generalized anxiety disorder 09/01/2018   Hyperlipidemia    Inflammatory disease of uterus 04/23/2007   Left flank pain 07/13/2021   Left upper quadrant pain 09/01/2017   Lower abdominal pain 08/06/2012   Lumbar back pain with radiculopathy affecting left lower extremity 01/24/2010   Lumbar radiculopathy 01/26/2021   Lump in upper inner quadrant of left breast 07/13/2021   Major depressive disorder    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   Otitis media 09/05/2010   Palpitations 07/20/2016   Peripheral neuropathy 02/22/2010   Retrolisthesis of vertebrae 08/22/2020   Shingles outbreak 04/14/2015   Sinusitis, acute maxillary 09/14/2013   Stress incontinence, female 08/01/2015   Thoracic aortic atherosclerosis 05/14/2017   Tinea corporis 09/01/2017   Tinnitus 03/04/2014   Torticollis 03/04/2014   Type II diabetes mellitus 06/05/2018    Past Surgical History:  Procedure Laterality Date   APPENDECTOMY     EYE SURGERY     KNEE ARTHROSCOPY Right 11/12/2006   TONSILLECTOMY     TOTAL ABDOMINAL HYSTERECTOMY     Family History:  Family History  Problem Relation Age of Onset   Hypertension Mother    Hiatal hernia Mother    Diabetes Father    Hypertension Father    Heart attack Father    Alcoholism Father    Bipolar disorder Father    Mental illness Sister    Suicidality Sister    Colon cancer Maternal Aunt 37  Breast cancer Maternal Aunt    Bipolar disorder Paternal Aunt    Family Psychiatric  History: None Social History:  Social History   Substance and Sexual Activity  Alcohol Use Yes   Alcohol/week: 0.0 - 1.0 standard drinks   Comment: occasional wine     Social History   Substance and Sexual Activity  Drug Use No    Social History   Socioeconomic History   Marital status: Married    Spouse name: Ngozi Alvidrez   Number of children: 3   Years of education: 14   Highest education level:  Some college, no degree  Occupational History   Occupation: retired  Tobacco Use   Smoking status: Former    Packs/day: 1.50    Years: 40.00    Pack years: 60.00    Types: Cigarettes    Quit date: 07/02/2004    Years since quitting: 17.4    Passive exposure: Past   Smokeless tobacco: Never   Tobacco comments:    Verified by Aldona Lento (Husband), & Lauralee Evener (Daughter)  Vaping Use   Vaping Use: Never used  Substance and Sexual Activity   Alcohol use: Yes    Alcohol/week: 0.0 - 1.0 standard drinks    Comment: occasional wine   Drug use: No   Sexual activity: Never  Other Topics Concern   Not on file  Social History Narrative   Lives with husband and grandson she is raising.   11/07/21 Lives with husband   Social Determinants of Health   Financial Resource Strain: Medium Risk   Difficulty of Paying Living Expenses: Somewhat hard  Food Insecurity: No Food Insecurity   Worried About Charity fundraiser in the Last Year: Never true   Ran Out of Food in the Last Year: Never true  Transportation Needs: No Transportation Needs   Lack of Transportation (Medical): No   Lack of Transportation (Non-Medical): No  Physical Activity: Inactive   Days of Exercise per Week: 0 days   Minutes of Exercise per Session: 0 min  Stress: No Stress Concern Present   Feeling of Stress : Not at all  Social Connections: Socially Integrated   Frequency of Communication with Friends and Family: More than three times a week   Frequency of Social Gatherings with Friends and Family: More than three times a week   Attends Religious Services: 1 to 4 times per year   Active Member of [employer] or Organizations: Yes   Attends Music therapist: More than 4 times per year   Marital Status: Married   Additional Social History:                         Sleep: Fair  Appetite:  Good  Current Medications: Current Facility-Administered Medications  Medication Dose Route Frequency  Provider Last Rate Last Admin   acetaminophen (TYLENOL) tablet 650 mg  650 mg Oral Q6H PRN Deloria Lair, NP   650 mg at 12/12/21 0928   alum & mag hydroxide-simeth (MAALOX/MYLANTA) 200-200-20 MG/5ML suspension 30 mL  30 mL Oral Q4H PRN Deloria Lair, NP       chlorproMAZINE (THORAZINE) tablet 25 mg  25 mg Oral QHS Parks Ranger, DO   25 mg at 12/12/21 2036   donepezil (ARICEPT) tablet 5 mg  5 mg Oral QHS Parks Ranger, DO   5 mg at 12/12/21 2036   DULoxetine (CYMBALTA) DR capsule 60 mg  60 mg Oral  Daily Anette Riedel M, NP   60 mg at 12/13/21 4481   haloperidol (HALDOL) tablet 1 mg  1 mg Oral BH-[id number] Parks Ranger, DO   1 mg at 12/13/21 8563   levothyroxine (SYNTHROID) tablet 50 mcg  50 mcg Oral QAC breakfast Deloria Lair, NP   50 mcg at 12/13/21 0757   lidocaine (LIDODERM) 5 % 1 patch  1 patch Transdermal Daily PRN Rulon Sera, MD       LORazepam (ATIVAN) tablet 1 mg  1 mg Oral Q4H PRN Parks Ranger, DO   1 mg at 12/11/21 2103   magnesium hydroxide (MILK OF MAGNESIA) suspension 30 mL  30 mL Oral Daily PRN Deloria Lair, NP       meloxicam (MOBIC) tablet 7.5 mg  7.5 mg Oral Daily Parks Ranger, DO   7.5 mg at 12/13/21 0933   OLANZapine (ZYPREXA) tablet 5 mg  5 mg Oral Q4H PRN Parks Ranger, DO   5 mg at 12/05/21 2108   propranolol (INDERAL) tablet 20 mg  20 mg Oral TID Deloria Lair, NP   20 mg at 12/13/21 1497    Lab Results: No results found for this or any previous visit (from the past 48 hour(s)).  Blood Alcohol level:  Lab Results  Component Value Date   ETH <10 02/63/7858    Metabolic Disorder Labs: Lab Results  Component Value Date   HGBA1C 6.4 10/27/2021   No results found for: PROLACTIN Lab Results  Component Value Date   CHOL 186 10/19/2021   TRIG 140.0 10/19/2021   HDL 70.90 10/19/2021   CHOLHDL 3 10/19/2021   VLDL 28.0 10/19/2021   LDLCALC 87 10/19/2021   LDLCALC 99 09/22/2020     Physical Findings: AIMS:  , ,  ,  ,    CIWA:    COWS:     Musculoskeletal: Strength & Muscle Tone: within normal limits Gait & Station: unsteady Patient leans: N/A  Psychiatric Specialty Exam:  Presentation  General Appearance: Bizarre; Disheveled  Eye Contact:Minimal  Speech:Pressured  Speech Volume:Increased  Handedness:Right   Mood and Affect  Mood:Angry; Depressed; Dysphoric; Hopeless  Affect:Depressed; Tearful; Full Range   Thought Process  Thought Processes:Disorganized; Irrevelant  Descriptions of Associations:Loose  Orientation:Partial  Thought Content:Delusions; Illogical; Scattered; Paranoid Ideation  History of Schizophrenia/Schizoaffective disorder:No  Duration of Psychotic Symptoms:N/A  Hallucinations:No data recorded Ideas of Reference:Delusions; Paranoia  Suicidal Thoughts:No data recorded Homicidal Thoughts:No data recorded  Sensorium  Memory:Remote Poor  Judgment:Impaired  Insight:None   Executive Functions  Concentration:Poor  Attention Span:Poor  Recall:Poor  Fund of Knowledge:Poor  Language:Poor   Psychomotor Activity  Psychomotor Activity:No data recorded  Assets  Assets:Financial Resources/Insurance; Housing; Intimacy; Social Support   Sleep  Sleep:No data recorded   Physical Exam: Physical Exam Vitals and nursing note reviewed.  Constitutional:      Appearance: Normal appearance. She is normal weight.  Neurological:     General: No focal deficit present.     Mental Status: She is alert and oriented to person, place, and time.  Psychiatric:        Attention and Perception: Attention and perception normal.        Mood and Affect: Mood and affect normal.        Speech: Speech is tangential.        Behavior: Behavior normal. Behavior is cooperative.        Thought Content: Thought content normal.        Cognition  and Memory: Cognition is impaired. Memory is impaired.        Judgment: Judgment  normal.   Review of Systems  Constitutional: Negative.   HENT: Negative.    Eyes: Negative.   Respiratory: Negative.    Cardiovascular: Negative.   Gastrointestinal: Negative.   Genitourinary: Negative.   Musculoskeletal: Negative.   Skin: Negative.   Neurological: Negative.   Endo/Heme/Allergies: Negative.   Psychiatric/Behavioral:  Positive for memory loss.   Blood pressure (!) 110/91, pulse 81, temperature 98 F (36.7 C), temperature source Oral, resp. rate 20, height 5\' 2"  (1.575 m), weight 86.2 kg, SpO2 97 %. Body mass index is 34.75 kg/m.   Treatment Plan Summary: Daily contact with patient to assess and evaluate symptoms and progress in treatment, Medication management, and Plan continue current medications.  Continue physical therapy.  [place], DO 12/13/2021, 1:25 PM

## 2021-12-13 NOTE — Progress Notes (Signed)
Patient in room in bed asleep. Sitter at bedside. Patient does not appear to be in any stress. Q 15 minute safety checks continued.

## 2021-12-14 DIAGNOSIS — F315 Bipolar disorder, current episode depressed, severe, with psychotic features: Secondary | ICD-10-CM | POA: Diagnosis not present

## 2021-12-14 DIAGNOSIS — R4182 Altered mental status, unspecified: Secondary | ICD-10-CM | POA: Diagnosis not present

## 2021-12-14 MED ORDER — QUETIAPINE FUMARATE 25 MG PO TABS
50.0000 mg | ORAL_TABLET | Freq: Every day | ORAL | Status: DC
  Administered 2021-12-14 – 2021-12-16 (×3): 50 mg via ORAL
  Filled 2021-12-14 (×3): qty 2

## 2021-12-14 NOTE — Progress Notes (Signed)
[facility] MD Progress Note  12/14/2021 1:32 PM Megan Duncan  MRN:  [id number] Subjective: Megan Duncan is becoming clear in her thoughts.  She is alert and oriented x2.  Vital signs are stable.  She is tolerating the medication and denies any side effects.  There is no evidence of EPS or TD.  She had trouble sleeping last night and was complaining of visual hallucinations and talking to [facility] and Maudie Flakes.  She is alert to person and place.  She knows that she is in [place] and that she goes to [place] and sees Dr. Deborah Chalk.  She tells me that she was on lithium for a long time and had tremors in her arms.  She states that she no longer has the tremors.  I talked to her about her medications.  I told her I am to switch her Thorazine to Seroquel tonight to see if that helps.  We are also going to order home health physical therapy.  Social work is going to discuss discharge planning with the daughter.  [name] still needs help with her ADLs.  Principal Problem: Altered mental status Diagnosis: Principal Problem:   Altered mental status  Total Time spent with patient: 15 minutes  Past Psychiatric History: As above  Past Medical History:  Past Medical History:  Diagnosis Date   Acute pain of right shoulder 09/24/2018   Acute upper respiratory infection 09/19/2010   Allergic rhinitis 10/03/2016   Anterolisthesis of lumbar spine    Arthritis    hands   Bilateral hip pain 09/02/2014   Bipolar II disorder    Cellulitis and abscess of trunk 09/15/2009   Ceruminosis 03/04/2014   Cervicalgia 06/20/2007   Chronic low back pain 06/20/2007   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-sided sciatica 08/06/2012   COPD (chronic obstructive pulmonary disease) ([place])    Deformity of finger 07/15/2014   Degeneration of lumbar intervertebral disc 08/22/2020   Degenerative lumbar spinal stenosis    Dermatitis 40/98/1191   Diastolic dysfunction    Per pt, diagnosed after [facility]   Diverticulosis    DOE  (dyspnea on exertion) 07/20/2016   Essential (primary) hypertension 04/23/2007   Estrogen deficiency 09/01/2017   External hemorrhoid 08/01/2015   Fibromyalgia    Foraminal stenosis of lumbar region 08/22/2020   Generalized anxiety disorder 09/01/2018   Hyperlipidemia    Inflammatory disease of uterus 04/23/2007   Left flank pain 07/13/2021   Left upper quadrant pain 09/01/2017   Lower abdominal pain 08/06/2012   Lumbar back pain with radiculopathy affecting left lower extremity 01/24/2010   Lumbar radiculopathy 01/26/2021   Lump in upper inner quadrant of left breast 07/13/2021   Major depressive disorder    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   Otitis media 09/05/2010   Palpitations 07/20/2016   Peripheral neuropathy 02/22/2010   Retrolisthesis of vertebrae 08/22/2020   Shingles outbreak 04/14/2015   Sinusitis, acute maxillary 09/14/2013   Stress incontinence, female 08/01/2015   Thoracic aortic atherosclerosis 05/14/2017   Tinea corporis 09/01/2017   Tinnitus 03/04/2014   Torticollis 03/04/2014   Type II diabetes mellitus 06/05/2018    Past Surgical History:  Procedure Laterality Date   APPENDECTOMY     EYE SURGERY     KNEE ARTHROSCOPY Right 11/12/2006   TONSILLECTOMY     TOTAL ABDOMINAL HYSTERECTOMY     Family History:  Family History  Problem Relation Age of Onset   Hypertension Mother    Hiatal hernia Mother  Diabetes Father    Hypertension Father    Heart attack Father    Alcoholism Father    Bipolar disorder Father    Mental illness Sister    Suicidality Sister    Colon cancer Maternal Aunt 26   Breast cancer Maternal Aunt    Bipolar disorder Paternal Aunt    Family Psychiatric  History: Sister committed suicide. Social History:  Social History   Substance and Sexual Activity  Alcohol Use Yes   Alcohol/week: 0.0 - 1.0 standard drinks   Comment: occasional wine     Social History   Substance and Sexual Activity   Drug Use No    Social History   Socioeconomic History   Marital status: Married    Spouse name: Shelsy Seng   Number of children: 3   Years of education: 14   Highest education level: Some college, no degree  Occupational History   Occupation: retired  Tobacco Use   Smoking status: Former    Packs/day: 1.50    Years: 40.00    Pack years: 60.00    Types: Cigarettes    Quit date: 07/02/2004    Years since quitting: 17.4    Passive exposure: Past   Smokeless tobacco: Never   Tobacco comments:    Verified by Aldona Lento (Husband), & Lauralee Evener (Daughter)  Vaping Use   Vaping Use: Never used  Substance and Sexual Activity   Alcohol use: Yes    Alcohol/week: 0.0 - 1.0 standard drinks    Comment: occasional wine   Drug use: No   Sexual activity: Never  Other Topics Concern   Not on file  Social History Narrative   Lives with husband and grandson she is raising.   11/07/21 Lives with husband   Social Determinants of Health   Financial Resource Strain: Medium Risk   Difficulty of Paying Living Expenses: Somewhat hard  Food Insecurity: No Food Insecurity   Worried About Charity fundraiser in the Last Year: Never true   Ran Out of Food in the Last Year: Never true  Transportation Needs: No Transportation Needs   Lack of Transportation (Medical): No   Lack of Transportation (Non-Medical): No  Physical Activity: Inactive   Days of Exercise per Week: 0 days   Minutes of Exercise per Session: 0 min  Stress: No Stress Concern Present   Feeling of Stress : Not at all  Social Connections: Socially Integrated   Frequency of Communication with Friends and Family: More than three times a week   Frequency of Social Gatherings with Friends and Family: More than three times a week   Attends Religious Services: 1 to 4 times per year   Active Member of [employer] or Organizations: Yes   Attends Music therapist: More than 4 times per year   Marital Status: Married    Additional Social History:                         Sleep: Poor  Appetite:  Good  Current Medications: Current Facility-Administered Medications  Medication Dose Route Frequency Provider Last Rate Last Admin   acetaminophen (TYLENOL) tablet 650 mg  650 mg Oral Q6H PRN Deloria Lair, NP   650 mg at 12/13/21 2057   alum & mag hydroxide-simeth (MAALOX/MYLANTA) 200-200-20 MG/5ML suspension 30 mL  30 mL Oral Q4H PRN Dixon, Rashaun M, NP       donepezil (ARICEPT) tablet 5 mg  5 mg Oral  QHS Parks Ranger, DO   5 mg at 12/13/21 2100   DULoxetine (CYMBALTA) DR capsule 60 mg  60 mg Oral Daily Anette Riedel M, NP   60 mg at 12/14/21 3419   haloperidol (HALDOL) tablet 1 mg  1 mg Oral BH-q8a4p Parks Ranger, DO   1 mg at 12/14/21 6222   levothyroxine (SYNTHROID) tablet 50 mcg  50 mcg Oral QAC breakfast Anette Riedel M, NP   50 mcg at 12/14/21 0700   lidocaine (LIDODERM) 5 % 1 patch  1 patch Transdermal Daily PRN Rulon Sera, MD   1 patch at 12/14/21 9798   LORazepam (ATIVAN) tablet 1 mg  1 mg Oral Q4H PRN Parks Ranger, DO   1 mg at 12/14/21 0159   magnesium hydroxide (MILK OF MAGNESIA) suspension 30 mL  30 mL Oral Daily PRN Deloria Lair, NP       meloxicam (MOBIC) tablet 7.5 mg  7.5 mg Oral Daily Parks Ranger, DO   7.5 mg at 12/14/21 9211   OLANZapine (ZYPREXA) tablet 5 mg  5 mg Oral Q4H PRN Parks Ranger, DO   5 mg at 12/05/21 2108   propranolol (INDERAL) tablet 20 mg  20 mg Oral TID Deloria Lair, NP   20 mg at 12/14/21 9417   QUEtiapine (SEROQUEL) tablet 50 mg  50 mg Oral QHS Parks Ranger, DO        Lab Results: No results found for this or any previous visit (from the past 48 hour(s)).  Blood Alcohol level:  Lab Results  Component Value Date   ETH <10 40/81/4481    Metabolic Disorder Labs: Lab Results  Component Value Date   HGBA1C 6.4 10/27/2021   No results found for: PROLACTIN Lab Results   Component Value Date   CHOL 186 10/19/2021   TRIG 140.0 10/19/2021   HDL 70.90 10/19/2021   CHOLHDL 3 10/19/2021   VLDL 28.0 10/19/2021   LDLCALC 87 10/19/2021   LDLCALC 99 09/22/2020    Physical Findings: AIMS:  , ,  ,  ,    CIWA:    COWS:     Musculoskeletal: Strength & Muscle Tone: decreased Gait & Station: unsteady Patient leans: N/A  Psychiatric Specialty Exam:  Presentation  General Appearance: Bizarre; Disheveled  Eye Contact:Minimal  Speech:Pressured  Speech Volume:Increased  Handedness:Right   Mood and Affect  Mood:Angry; Depressed; Dysphoric; Hopeless  Affect:Depressed; Tearful; Full Range   Thought Process  Thought Processes:Disorganized; Irrevelant  Descriptions of Associations:Loose  Orientation:Partial  Thought Content:Delusions; Illogical; Scattered; Paranoid Ideation  History of Schizophrenia/Schizoaffective disorder:No  Duration of Psychotic Symptoms:N/A  Hallucinations:No data recorded Ideas of Reference:Delusions; Paranoia  Suicidal Thoughts:No data recorded Homicidal Thoughts:No data recorded  Sensorium  Memory:Remote Poor  Judgment:Impaired  Insight:None   Executive Functions  Concentration:Poor  Attention Span:Poor  Recall:Poor  Fund of Knowledge:Poor  Language:Poor   Psychomotor Activity  Psychomotor Activity:No data recorded  Assets  Assets:Financial Resources/Insurance; Housing; Intimacy; Social Support   Sleep  Sleep:No data recorded   Physical Exam: Physical Exam Vitals and nursing note reviewed.  Constitutional:      Appearance: Normal appearance. She is normal weight.  Neurological:     General: No focal deficit present.     Mental Status: She is alert and oriented to person, place, and time.  Psychiatric:        Attention and Perception: Attention normal. She perceives visual hallucinations.        Mood and Affect: Mood  and affect normal.        Speech: Speech is tangential.         Behavior: Behavior normal. Behavior is cooperative.        Thought Content: Thought content normal.        Cognition and Memory: Cognition is impaired. Memory is impaired.        Judgment: Judgment normal.   Review of Systems  Constitutional: Negative.   HENT: Negative.    Eyes: Negative.   Respiratory: Negative.    Cardiovascular: Negative.   Gastrointestinal: Negative.   Genitourinary: Negative.   Musculoskeletal: Negative.   Skin: Negative.   Neurological: Negative.   Endo/Heme/Allergies: Negative.   Psychiatric/Behavioral:  Positive for hallucinations. The patient has insomnia.   Blood pressure 122/88, pulse 73, temperature 97.7 F (36.5 C), temperature source Oral, resp. rate 20, height 5\' 2"  (1.575 m), weight 86.2 kg, SpO2 95 %. Body mass index is 34.75 kg/m.   Treatment Plan Summary: Daily contact with patient to assess and evaluate symptoms and progress in treatment, Medication management, and Plan discontinue Thorazine and start Seroquel 50 mg at bedtime.  Order home health physical therapy.  Discontinue one-to-one.  [place], DO 12/14/2021, 1:32 PM

## 2021-12-14 NOTE — Group Note (Deleted)
LCSW Group Therapy Note  Group Date: 12/14/2021 Start Time: 1300 End Time: 1400   Type of Therapy and Topic:  Group Therapy - How To Cope with Nervousness about Discharge   Participation Level:  {BHH PARTICIPATION [id number]}   Description of Group This process group involved identification of patients' feelings about discharge. Some of them are scheduled to be discharged soon, while others are new admissions, but each of them was asked to share thoughts and feelings surrounding discharge from the hospital. One common theme was that they are excited at the prospect of going home, while another was that many of them are apprehensive about sharing why they were hospitalized. Patients were given the opportunity to discuss these feelings with their peers in preparation for discharge.  Therapeutic Goals  1. Patient will identify their overall feelings about pending discharge. 2. Patient will think about how they might proactively address issues that they believe will once again arise once they get home (i.e. with parents). 3. Patients will participate in discussion about having hope for change.   Summary of Patient Progress:  *** was very active throughout the session. *** demonstrated *** insight into the subject matter, and proved open to input from peers and feedback from [place]. *** was respectful of peers and participated throughout the entire session.   Therapeutic Modalities Cognitive Behavioral Therapy   Larose Kells 12/14/2021  1:42 PM

## 2021-12-14 NOTE — Progress Notes (Signed)
Recreation Therapy Notes    Date: 12/14/2021  Time: 1:30pm    Location: Craft room    Behavioral response: N/A   Intervention Topic: Values   Discussion/Intervention: Patient refused to attend group.   Clinical Observations/Feedback:  Patient refused to attend group.   Laasia Arcos LRT/CTRS          Aylene Acoff 12/14/2021 2:09 PM

## 2021-12-14 NOTE — Progress Notes (Signed)
Patient remains A&O x2.  She spent most of the day watching TV in day room.  Patient has not attempted to get up without asking for assistance. Pt ambulates with rolling walker and stand by assist. Will DC 1:1 observation at this time and continue high risk fall precautions.

## 2021-12-14 NOTE — Plan of Care (Addendum)
Patient presents A&O x 2.  Affect is pleasant, calm and cooperative. Denies AVH, SI, HI, anxiety, depression or pain. Reports not sleeping well.  VSS.  Patient compliant with all scheduled meds. Pt ambulates with walker and stand by assist.  Will continue 1:1 safety observation at this time for high risk for falls due to confusion and ongoing Q15 minute safety check rounds per unit protocol.  Problem: Education: Goal: Knowledge of [place] [facility] Education information/materials will improve Outcome: Progressing Goal: Emotional status will improve Outcome: Progressing Goal: Mental status will improve Outcome: Progressing Goal: Verbalization of understanding the information provided will improve Outcome: Progressing   Problem: Activity: Goal: Interest or engagement in activities will improve Outcome: Progressing Goal: Sleeping patterns will improve Outcome: Progressing   Problem: Coping: Goal: Ability to demonstrate self-control will improve Outcome: Progressing   Problem: Health Behavior/Discharge Planning: Goal: Compliance with treatment plan for underlying cause of condition will improve Outcome: Progressing

## 2021-12-14 NOTE — BHH Counselor (Signed)
CSW spoke with pt's daughter, Pincus Large, [phone] regarding pt discharge planning.   She provided CSW with number for Di Kindle at [facility], [phone] to contact regarding coordinating pt care. CSW will reach out to obtain collateral information on pt care.   CSW stated that PT recommended assistance for pt with walking/ADL's and equipment recommendation for rolling walker. [name] stated that they would follow recommendations. CSW will assist with obtaining equipment and home health referral.   Forde Dandy ended without incident. No other requests were made.   Nassir Neidert Martinique, MSW, LCSW-A 2/2/20232:38 PM

## 2021-12-14 NOTE — Progress Notes (Signed)
1:1 NOTE  Patient in bed sleeping respirations noted. No S/S of distress. Patient observed on 1:1 and all fall protocol in place.  Patient remains safe.

## 2021-12-14 NOTE — BH IP Treatment Plan (Signed)
Interdisciplinary Treatment and Diagnostic Plan Update  12/14/2021 Time of Session: 9:30AM SHACORA ZYNDA MRN: [id number]  Principal Diagnosis: Altered mental status  Secondary Diagnoses: Principal Problem:   Altered mental status   Current Medications:  Current Facility-Administered Medications  Medication Dose Route Frequency Provider Last Rate Last Admin   acetaminophen (TYLENOL) tablet 650 mg  650 mg Oral Q6H PRN Deloria Lair, NP   650 mg at 12/13/21 2057   alum & mag hydroxide-simeth (MAALOX/MYLANTA) 200-200-20 MG/5ML suspension 30 mL  30 mL Oral Q4H PRN Deloria Lair, NP       chlorproMAZINE (THORAZINE) tablet 25 mg  25 mg Oral QHS Parks Ranger, DO   25 mg at 12/13/21 2100   donepezil (ARICEPT) tablet 5 mg  5 mg Oral QHS Parks Ranger, DO   5 mg at 12/13/21 2100   DULoxetine (CYMBALTA) DR capsule 60 mg  60 mg Oral Daily Anette Riedel M, NP   60 mg at 12/14/21 8270   haloperidol (HALDOL) tablet 1 mg  1 mg Oral BH-[id number] Parks Ranger, DO   1 mg at 12/14/21 7867   levothyroxine (SYNTHROID) tablet 50 mcg  50 mcg Oral QAC breakfast Anette Riedel M, NP   50 mcg at 12/14/21 0700   lidocaine (LIDODERM) 5 % 1 patch  1 patch Transdermal Daily PRN Rulon Sera, MD   1 patch at 12/14/21 5449   LORazepam (ATIVAN) tablet 1 mg  1 mg Oral Q4H PRN Parks Ranger, DO   1 mg at 12/14/21 0159   magnesium hydroxide (MILK OF MAGNESIA) suspension 30 mL  30 mL Oral Daily PRN Deloria Lair, NP       meloxicam (MOBIC) tablet 7.5 mg  7.5 mg Oral Daily Parks Ranger, DO   7.5 mg at 12/14/21 2010   OLANZapine (ZYPREXA) tablet 5 mg  5 mg Oral Q4H PRN Parks Ranger, DO   5 mg at 12/05/21 2108   propranolol (INDERAL) tablet 20 mg  20 mg Oral TID Deloria Lair, NP   20 mg at 12/14/21 0712   PTA Medications: Medications Prior to Admission  Medication Sig Dispense Refill Last Dose   albuterol (VENTOLIN HFA) 108 (90 Base) MCG/ACT inhaler  Inhale 2 puffs into the lungs every 4 (four) hours as needed. For shortness of breath and wheezing 18 g 3    ALPRAZolam (XANAX) 0.5 MG tablet Take 30 min prior to procedure 10 tablet 0    amLODipine (NORVASC) 5 MG tablet TAKE 1 TABLET (5 MG TOTAL) BY MOUTH DAILY. 90 tablet 1    blood glucose meter kit and supplies Dispense based on patient and insurance preference. Use once a day.  DX Code: E11.9 1 each 0    cephALEXin (KEFLEX) 500 MG capsule Take 1 capsule (500 mg total) by mouth 2 (two) times daily. 14 capsule 0    clorazepate (TRANXENE) 3.75 MG tablet Take 1 tablet (3.75 mg total) by mouth 2 (two) times daily as needed for anxiety or sleep. 60 tablet 1    DULoxetine (CYMBALTA) 30 MG capsule Take 1 capsule (30 mg total) by mouth daily. For 2 weeks, then start 60 mg daily. 14 capsule 0    DULoxetine (CYMBALTA) 60 MG capsule TAKE 1 CAPSULE BY MOUTH EVERY DAY IN THE MORNING, after taking 30 mg for 2 weeks. 90 capsule 0    glucose blood test strip Use as instructed once a day.  Dx Code: E11.9 30 each 2  hydrocortisone-pramoxine (ANALPRAM HC) 2.5-1 % rectal cream Place 1 application rectally 3 (three) times daily. 30 g 3    Lancets MISC Use as directed once a day.  Dx code: E11.9 30 each 2    lithium carbonate 150 MG capsule TAKE 1 CAPSULE (150 MG TOTAL) BY MOUTH DAILY, TAKE WITH LITHIUM 600 MG TO EQUAL 750 MG TOTAL 90 capsule 0    lithium carbonate 300 MG capsule TAKE 2 CAPSULES (600 MG) BY MOUTH AT BEDTIME, WITH 150 MG CAPSULE 180 capsule 0    metFORMIN (GLUCOPHAGE) 1000 MG tablet Take 1 tablet (1,000 mg total) by mouth 2 (two) times daily with a meal. 180 tablet 1    ondansetron (ZOFRAN) 4 MG tablet Take 1 tablet (4 mg total) by mouth every 8 (eight) hours as needed for nausea or vomiting. 15 tablet 0    QUEtiapine (SEROQUEL) 50 MG tablet Take 1 tablet (50 mg total) by mouth at bedtime. 30 tablet 2    traZODone (DESYREL) 50 MG tablet TAKE 1/2 TO 2 TABLETS BY MOUTH EVERY DAY AT BEDTIME AS NEEDED FOR  SLEEP 180 tablet 0     Patient Stressors: Health problems    Patient Strengths: Ability for insight  General fund of knowledge  Supportive family/friends   Treatment Modalities: Medication Management, Group therapy, Case management,  1 to 1 session with clinician, Psychoeducation, Recreational therapy.   Physician Treatment Plan for Primary Diagnosis: Altered mental status Long Term Goal(s): Improvement in symptoms so as ready for discharge   Short Term Goals: Ability to identify changes in lifestyle to reduce recurrence of condition will improve Ability to verbalize feelings will improve Ability to disclose and discuss suicidal ideas Ability to demonstrate self-control will improve Ability to identify and develop effective coping behaviors will improve Ability to maintain clinical measurements within normal limits will improve Compliance with prescribed medications will improve Ability to identify triggers associated with substance abuse/mental health issues will improve  Medication Management: Evaluate patient's response, side effects, and tolerance of medication regimen.  Therapeutic Interventions: 1 to 1 sessions, Unit Group sessions and Medication administration.  Evaluation of Outcomes: Progressing  Physician Treatment Plan for Secondary Diagnosis: Principal Problem:   Altered mental status  Long Term Goal(s): Improvement in symptoms so as ready for discharge   Short Term Goals: Ability to identify changes in lifestyle to reduce recurrence of condition will improve Ability to verbalize feelings will improve Ability to disclose and discuss suicidal ideas Ability to demonstrate self-control will improve Ability to identify and develop effective coping behaviors will improve Ability to maintain clinical measurements within normal limits will improve Compliance with prescribed medications will improve Ability to identify triggers associated with substance abuse/mental  health issues will improve     Medication Management: Evaluate patient's response, side effects, and tolerance of medication regimen.  Therapeutic Interventions: 1 to 1 sessions, Unit Group sessions and Medication administration.  Evaluation of Outcomes: Progressing   RN Treatment Plan for Primary Diagnosis: Altered mental status Long Term Goal(s): Knowledge of disease and therapeutic regimen to maintain health will improve  Short Term Goals: Ability to remain free from injury will improve, Ability to verbalize frustration and anger appropriately will improve, Ability to demonstrate self-control, Ability to participate in decision making will improve, Ability to verbalize feelings will improve, Ability to identify and develop effective coping behaviors will improve, and Compliance with prescribed medications will improve  Medication Management: RN will administer medications as ordered by provider, will assess and evaluate patient's response and  provide education to patient for prescribed medication. RN will report any adverse and/or side effects to prescribing provider.  Therapeutic Interventions: 1 on 1 counseling sessions, Psychoeducation, Medication administration, Evaluate responses to treatment, Monitor vital signs and CBGs as ordered, Perform/monitor CIWA, COWS, AIMS and Fall Risk screenings as ordered, Perform wound care treatments as ordered.  Evaluation of Outcomes: Progressing   LCSW Treatment Plan for Primary Diagnosis: Altered mental status Long Term Goal(s): Safe transition to appropriate next level of care at discharge, Engage patient in therapeutic group addressing interpersonal concerns.  Short Term Goals: Engage patient in aftercare planning with referrals and resources, Increase social support, Increase ability to appropriately verbalize feelings, Increase emotional regulation, Facilitate acceptance of mental health diagnosis and concerns, Identify triggers associated with  mental health/substance abuse issues, and Increase skills for wellness and recovery  Therapeutic Interventions: Assess for all discharge needs, 1 to 1 time with Social worker, Explore available resources and support systems, Assess for adequacy in community support network, Educate family and significant other(s) on suicide prevention, Complete Psychosocial Assessment, Interpersonal group therapy.  Evaluation of Outcomes: Progressing   Progress in Treatment: Attending groups: No. Participating in groups: No. Taking medication as prescribed: Yes. Toleration medication: Yes. Family/Significant other contact made: Yes, individual(s) contacted:  pt's daughter, Pincus Large Patient understands diagnosis: No. Discussing patient identified problems/goals with staff: No. Medical problems stabilized or resolved: Yes. Denies suicidal/homicidal ideation: Yes. Issues/concerns per patient self-inventory: No. Other: None  New problem(s) identified: No, Describe:  None  New Short Term/Long Term Goal(s): Patient to work towards elimination of symptoms of psychosis, medication management for mood stabilization; development of comprehensive mental wellness plan. Update 12/14/21: No changes at this time.      Patient Goals:  No update. Update 12/14/21: No changes at this time.    Discharge Plan or Barriers: CSW will assist pt in development of appropriate discharge/aftercare plan. Update 12/14/21: CSW was given permission to contact pt's daughter, Pincus Large. She stated that pt can return home. Pt will continue care with [facility] and recommendation for PT with Homehealth or outpatient provider. CSW will coordinate discharge with daughter and Di Kindle, [place] [facility] [place].    Reason for Continuation of Hospitalization:  Delusions  Depression Medication stabilization  Estimated Length of Stay: TBD   Scribe for Treatment Team: Maurisha Mongeau A Martinique, Latanya Presser 12/14/2021 10:40 AM

## 2021-12-15 DIAGNOSIS — F315 Bipolar disorder, current episode depressed, severe, with psychotic features: Secondary | ICD-10-CM | POA: Diagnosis not present

## 2021-12-15 DIAGNOSIS — R4182 Altered mental status, unspecified: Secondary | ICD-10-CM | POA: Diagnosis not present

## 2021-12-15 NOTE — Progress Notes (Signed)
Patient spent most of the shift resting in bed and she seemed to sleep well through out the night. She was pleasant on approach, She took all of her medicine on the shift. She denies SI, HI & AVH. No new behavioral issues to report on shift thus far.

## 2021-12-15 NOTE — Progress Notes (Signed)
[facility] MD Progress Note  12/15/2021 11:22 AM Megan Duncan  MRN:  [id number] Subjective:  Megan Duncan's thought process is improving. Her conversations are making more sense. No side effects from her medications. She slept well last night. Social work is working on Teacher, adult education with psychiatric follow up and Physical Therapy.  CSW spoke with Megan Duncan's daughter, Pincus Large, [phone] regarding Megan Duncan discharge planning.    She provided CSW with number for Di Kindle at [facility], [phone] to contact regarding coordinating Megan Duncan care. CSW will reach out to obtain collateral information on Megan Duncan care.    CSW stated that Megan Duncan recommended assistance for Megan Duncan with walking/ADL's and equipment recommendation for rolling walker. [name] stated that they would follow recommendations. CSW will assist with obtaining equipment and home health referral.   Principal Problem: Altered mental status Diagnosis: Principal Problem:   Altered mental status  Total Time spent with patient: 15 minutes  Past Psychiatric History: Bipolar Disorder sees Dr[facility] Deborah Chalk at [facility]  Past Medical History:  Past Medical History:  Diagnosis Date   Acute pain of right shoulder 09/24/2018   Acute upper respiratory infection 09/19/2010   Allergic rhinitis 10/03/2016   Anterolisthesis of lumbar spine    Arthritis    hands   Bilateral hip pain 09/02/2014   Bipolar II disorder    Cellulitis and abscess of trunk 09/15/2009   Ceruminosis 03/04/2014   Cervicalgia 06/20/2007   Chronic low back pain 06/20/2007   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-sided sciatica 08/06/2012   COPD (chronic obstructive pulmonary disease) ([place])    Deformity of finger 07/15/2014   Degeneration of lumbar intervertebral disc 08/22/2020   Degenerative lumbar spinal stenosis    Dermatitis 98/26/4158   Diastolic dysfunction    Per Megan Duncan, diagnosed after [facility]   Diverticulosis    DOE (dyspnea on exertion) 07/20/2016   Essential (primary)  hypertension 04/23/2007   Estrogen deficiency 09/01/2017   External hemorrhoid 08/01/2015   Fibromyalgia    Foraminal stenosis of lumbar region 08/22/2020   Generalized anxiety disorder 09/01/2018   Hyperlipidemia    Inflammatory disease of uterus 04/23/2007   Left flank pain 07/13/2021   Left upper quadrant pain 09/01/2017   Lower abdominal pain 08/06/2012   Lumbar back pain with radiculopathy affecting left lower extremity 01/24/2010   Lumbar radiculopathy 01/26/2021   Lump in upper inner quadrant of left breast 07/13/2021   Major depressive disorder    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   Otitis media 09/05/2010   Palpitations 07/20/2016   Peripheral neuropathy 02/22/2010   Retrolisthesis of vertebrae 08/22/2020   Shingles outbreak 04/14/2015   Sinusitis, acute maxillary 09/14/2013   Stress incontinence, female 08/01/2015   Thoracic aortic atherosclerosis 05/14/2017   Tinea corporis 09/01/2017   Tinnitus 03/04/2014   Torticollis 03/04/2014   Type II diabetes mellitus 06/05/2018    Past Surgical History:  Procedure Laterality Date   APPENDECTOMY     EYE SURGERY     KNEE ARTHROSCOPY Right 11/12/2006   TONSILLECTOMY     TOTAL ABDOMINAL HYSTERECTOMY     Family History:  Family History  Problem Relation Age of Onset   Hypertension Mother    Hiatal hernia Mother    Diabetes Father    Hypertension Father    Heart attack Father    Alcoholism Father    Bipolar disorder Father    Mental illness Sister    Suicidality Sister    Colon cancer Maternal Aunt 65  Breast cancer Maternal Aunt    Bipolar disorder Paternal Aunt    Family Psychiatric  History: Sister committed suicide Social History:  Social History   Substance and Sexual Activity  Alcohol Use Yes   Alcohol/week: 0.0 - 1.0 standard drinks   Comment: occasional wine     Social History   Substance and Sexual Activity  Drug Use No    Social History   Socioeconomic  History   Marital status: Married    Spouse name: Anouk Critzer   Number of children: 3   Years of education: 14   Highest education level: Some college, no degree  Occupational History   Occupation: retired  Tobacco Use   Smoking status: Former    Packs/day: 1.50    Years: 40.00    Pack years: 60.00    Types: Cigarettes    Quit date: 07/02/2004    Years since quitting: 17.4    Passive exposure: Past   Smokeless tobacco: Never   Tobacco comments:    Verified by Aldona Lento (Husband), & Lauralee Evener (Daughter)  Vaping Use   Vaping Use: Never used  Substance and Sexual Activity   Alcohol use: Yes    Alcohol/week: 0.0 - 1.0 standard drinks    Comment: occasional wine   Drug use: No   Sexual activity: Never  Other Topics Concern   Not on file  Social History Narrative   Lives with husband and grandson she is raising.   11/07/21 Lives with husband   Social Determinants of Health   Financial Resource Strain: Medium Risk   Difficulty of Paying Living Expenses: Somewhat hard  Food Insecurity: No Food Insecurity   Worried About Charity fundraiser in the Last Year: Never true   Ran Out of Food in the Last Year: Never true  Transportation Needs: No Transportation Needs   Lack of Transportation (Medical): No   Lack of Transportation (Non-Medical): No  Physical Activity: Inactive   Days of Exercise per Week: 0 days   Minutes of Exercise per Session: 0 min  Stress: No Stress Concern Present   Feeling of Stress : Not at all  Social Connections: Socially Integrated   Frequency of Communication with Friends and Family: More than three times a week   Frequency of Social Gatherings with Friends and Family: More than three times a week   Attends Religious Services: 1 to 4 times per year   Active Member of [employer] or Organizations: Yes   Attends Music therapist: More than 4 times per year   Marital Status: Married   Additional Social History:                          Sleep: Good  Appetite:  Good  Current Medications: Current Facility-Administered Medications  Medication Dose Route Frequency Provider Last Rate Last Admin   acetaminophen (TYLENOL) tablet 650 mg  650 mg Oral Q6H PRN Deloria Lair, NP   650 mg at 12/13/21 2057   alum & mag hydroxide-simeth (MAALOX/MYLANTA) 200-200-20 MG/5ML suspension 30 mL  30 mL Oral Q4H PRN Dixon, Rashaun M, NP       donepezil (ARICEPT) tablet 5 mg  5 mg Oral QHS Parks Ranger, DO   5 mg at 12/14/21 2114   DULoxetine (CYMBALTA) DR capsule 60 mg  60 mg Oral Daily Dixon, Rashaun M, NP   60 mg at 12/15/21 1007   haloperidol (HALDOL) tablet 1 mg  1  mg Oral BH-q8a4p Parks Ranger, DO   1 mg at 12/15/21 0923   levothyroxine (SYNTHROID) tablet 50 mcg  50 mcg Oral QAC breakfast Anette Riedel M, NP   50 mcg at 12/15/21 0808   lidocaine (LIDODERM) 5 % 1 patch  1 patch Transdermal Daily PRN Rulon Sera, MD   1 patch at 12/14/21 3007   LORazepam (ATIVAN) tablet 1 mg  1 mg Oral Q4H PRN Parks Ranger, DO   1 mg at 12/14/21 0159   magnesium hydroxide (MILK OF MAGNESIA) suspension 30 mL  30 mL Oral Daily PRN Deloria Lair, NP       meloxicam (MOBIC) tablet 7.5 mg  7.5 mg Oral Daily Parks Ranger, DO   7.5 mg at 12/15/21 1007   OLANZapine (ZYPREXA) tablet 5 mg  5 mg Oral Q4H PRN Parks Ranger, DO   5 mg at 12/05/21 2108   propranolol (INDERAL) tablet 20 mg  20 mg Oral TID Deloria Lair, NP   20 mg at 12/15/21 1007   QUEtiapine (SEROQUEL) tablet 50 mg  50 mg Oral QHS Parks Ranger, DO   50 mg at 12/14/21 2114    Lab Results: No results found for this or any previous visit (from the past 48 hour(s)).  Blood Alcohol level:  Lab Results  Component Value Date   ETH <10 62/26/3335    Metabolic Disorder Labs: Lab Results  Component Value Date   HGBA1C 6.4 10/27/2021   No results found for: PROLACTIN Lab Results  Component Value Date   CHOL 186  10/19/2021   TRIG 140.0 10/19/2021   HDL 70.90 10/19/2021   CHOLHDL 3 10/19/2021   VLDL 28.0 10/19/2021   LDLCALC 87 10/19/2021   LDLCALC 99 09/22/2020    Physical Findings: AIMS:  , ,  ,  ,    CIWA:    COWS:     Musculoskeletal: Strength & Muscle Tone: within normal limits Gait & Station: Unsteady Patient leans: N/A  Psychiatric Specialty Exam:  Presentation  General Appearance: Bizarre; Disheveled  Eye Contact:Minimal  Speech:Pressured  Speech Volume:Increased  Handedness:Right   Mood and Affect  Mood:Angry; Depressed; Dysphoric; Hopeless  Affect:Depressed; Tearful; Full Range   Thought Process  Thought Processes:Disorganized; Irrevelant  Descriptions of Associations:Loose  Orientation:Partial  Thought Content:Delusions; Illogical; Scattered; Paranoid Ideation  History of Schizophrenia/Schizoaffective disorder:No  Duration of Psychotic Symptoms:N/A  Hallucinations:No data recorded Ideas of Reference:Delusions; Paranoia  Suicidal Thoughts:No data recorded Homicidal Thoughts:No data recorded  Sensorium  Memory:Remote Poor  Judgment:Impaired  Insight:None   Executive Functions  Concentration:Poor  Attention Span:Poor  Recall:Poor  Fund of Knowledge:Poor  Language:Poor   Psychomotor Activity  Psychomotor Activity:No data recorded  Assets  Assets:Financial Resources/Insurance; Housing; Intimacy; Social Support   Sleep  Sleep:No data recorded   Physical Exam: Physical Exam Vitals and nursing note reviewed.  Constitutional:      Appearance: Normal appearance. She is normal weight.  Neurological:     General: No focal deficit present.     Mental Status: She is alert and oriented to person, place, and time.  Psychiatric:        Attention and Perception: Attention and perception normal.        Mood and Affect: Mood and affect normal.        Speech: Speech normal.        Behavior: Behavior normal. Behavior is cooperative.         Thought Content: Thought content normal.  Cognition and Memory: Cognition is impaired. Memory is impaired.        Judgment: Judgment normal.   Review of Systems  Constitutional: Negative.   HENT: Negative.    Eyes: Negative.   Respiratory: Negative.    Cardiovascular: Negative.   Gastrointestinal: Negative.   Genitourinary: Negative.   Musculoskeletal: Negative.   Skin: Negative.   Neurological: Negative.   Endo/Heme/Allergies: Negative.   Psychiatric/Behavioral:  Positive for memory loss.   Blood pressure 106/66, pulse 73, temperature 98.8 F (37.1 C), temperature source Oral, resp. rate 18, height 5\' 2"  (1.575 m), weight 86.2 kg, SpO2 98 %. Body mass index is 34.75 kg/m.   Treatment Plan Summary: Daily contact with patient to assess and evaluate symptoms and progress in treatment, Medication management, and Plan we will see how she does on the 50 mg of Seroquel tonight and increase it if necessary.  Social work is working on discharge hopefully Monday.  [place], DO 12/15/2021, 11:22 AM

## 2021-12-15 NOTE — Progress Notes (Signed)
Recreation Therapy Notes  Date: 12/15/2021  Time: 1:30pm    Location: Craft room    Behavioral response: N/A   Intervention Topic: Time Management   Discussion/Intervention: Patient refused to attend group.   Clinical Observations/Feedback:  Patient refused to attend group.   Marcina Kinnison LRT/CTRS         Noheli Melder 12/15/2021 3:37 PM

## 2021-12-15 NOTE — Plan of Care (Addendum)
Patient presents A&O x 2.  Affect is pleasant, calm and cooperative. When asked how she is doing this morning she states, "So far so good."  Denies AVH, SI, HI, anxiety or depression. Reports chronic lower back pain rating 4/10 - Lidoderm patch applied as ordered with relief reported.  Reports sleeping well.  VSS.  Patient compliant with all scheduled meds. Pt ambulates with rolling walker.  Will continue ongoing Q15 minute safety check rounds per unit protocol.  Problem: Education: Goal: Knowledge of [facility] Education information/materials will improve Outcome: Progressing Goal: Emotional status will improve Outcome: Progressing Goal: Mental status will improve Outcome: Progressing Goal: Verbalization of understanding the information provided will improve Outcome: Progressing   Problem: Activity: Goal: Interest or engagement in activities will improve Outcome: Progressing   Problem: Coping: Goal: Ability to verbalize frustrations and anger appropriately will improve Outcome: Progressing   Problem: Health Behavior/Discharge Planning: Goal: Compliance with treatment plan for underlying cause of condition will improve Outcome: Progressing   Problem: Safety: Goal: Periods of time without injury will increase Outcome: Progressing   Problem: Education: Goal: Knowledge of the prescribed therapeutic regimen will improve Outcome: Progressing   Problem: Coping: Goal: Coping ability will improve Outcome: Progressing

## 2021-12-15 NOTE — Group Note (Signed)
[facility] LCSW Group Therapy Note   Group Date: 12/15/2021 Start Time: 8657 End Time: 1430   Type of Therapy/Topic:  Group Therapy:  Balance in Life  Participation Level:  Did Not Attend   Description of Group:    This group will address the concept of balance and how it feels and looks when one is unbalanced. Patients will be encouraged to process areas in their lives that are out of balance, and identify reasons for remaining unbalanced. Facilitators will guide patients utilizing problem- solving interventions to address and correct the stressor making their life unbalanced. Understanding and applying boundaries will be explored and addressed for obtaining  and maintaining a balanced life. Patients will be encouraged to explore ways to assertively make their unbalanced needs known to significant others in their lives, using other group members and facilitator for support and feedback.  Therapeutic Goals: Patient will identify two or more emotions or situations they have that consume much of in their lives. Patient will identify signs/triggers that life has become out of balance:  Patient will identify two ways to set boundaries in order to achieve balance in their lives:  Patient will demonstrate ability to communicate their needs through discussion and/or role plays  Summary of Patient Progress:  X    Therapeutic Modalities:   Cognitive Behavioral Therapy Solution-Focused Therapy Assertiveness Training   Buckman Martinique, LCSWA

## 2021-12-16 DIAGNOSIS — R4182 Altered mental status, unspecified: Secondary | ICD-10-CM | POA: Diagnosis not present

## 2021-12-16 NOTE — Progress Notes (Signed)
Patients spouse visited during night shift.  She was A & O x3.  Medication compliant.  She interacted with others in the day room before going to bed.  She was pleasant.  Denied SI, HI, or AVH.  No new behavioral issues during night shift.  She ambulated with walker on night shift.  Q15 minute safety check rounds per unit protocol.

## 2021-12-16 NOTE — Progress Notes (Signed)
[facility] MD Progress Note  12/16/2021 2:04 PM Megan Duncan  MRN:  [id number]  Principal Problem: Altered mental status Diagnosis: Principal Problem:   Altered mental status  Patient is a  75y.o. female who presents to the [facility] in the context of worsening cognition, agitation, and depression.  Interval History Patient was seen today for re-evaluation.  Nursing reports no events overnight. The patient has no issues with performing ADLs.  Patient has been medication compliant.    Subjective:  On assessment patient reports feeling good for the most part. She denies any mood-related complaints; denies feeling depressed, says her anxiety is mild. Her physical complaints - "discomfort on my sides" that she relates to limited mobility. She denies suicidal/homicidal ideations. She denies auditory/visual hallucinations. The patient reports no side effects from medications.    Labs: no new results for review.     Total Time spent with patient: 20 minutes  Past Psychiatric History: see H&P   Past Medical History:  Past Medical History:  Diagnosis Date   Acute pain of right shoulder 09/24/2018   Acute upper respiratory infection 09/19/2010   Allergic rhinitis 10/03/2016   Anterolisthesis of lumbar spine    Arthritis    hands   Bilateral hip pain 09/02/2014   Bipolar II disorder    Cellulitis and abscess of trunk 09/15/2009   Ceruminosis 03/04/2014   Cervicalgia 06/20/2007   Chronic low back pain 06/20/2007   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-sided sciatica 08/06/2012   COPD (chronic obstructive pulmonary disease) ([place])    Deformity of finger 07/15/2014   Degeneration of lumbar intervertebral disc 08/22/2020   Degenerative lumbar spinal stenosis    Dermatitis 36/64/4034   Diastolic dysfunction    Per pt, diagnosed after [facility]   Diverticulosis    DOE (dyspnea on exertion) 07/20/2016   Essential (primary) hypertension 04/23/2007   Estrogen deficiency 09/01/2017   External  hemorrhoid 08/01/2015   Fibromyalgia    Foraminal stenosis of lumbar region 08/22/2020   Generalized anxiety disorder 09/01/2018   Hyperlipidemia    Inflammatory disease of uterus 04/23/2007   Left flank pain 07/13/2021   Left upper quadrant pain 09/01/2017   Lower abdominal pain 08/06/2012   Lumbar back pain with radiculopathy affecting left lower extremity 01/24/2010   Lumbar radiculopathy 01/26/2021   Lump in upper inner quadrant of left breast 07/13/2021   Major depressive disorder    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   Otitis media 09/05/2010   Palpitations 07/20/2016   Peripheral neuropathy 02/22/2010   Retrolisthesis of vertebrae 08/22/2020   Shingles outbreak 04/14/2015   Sinusitis, acute maxillary 09/14/2013   Stress incontinence, female 08/01/2015   Thoracic aortic atherosclerosis 05/14/2017   Tinea corporis 09/01/2017   Tinnitus 03/04/2014   Torticollis 03/04/2014   Type II diabetes mellitus 06/05/2018    Past Surgical History:  Procedure Laterality Date   APPENDECTOMY     EYE SURGERY     KNEE ARTHROSCOPY Right 11/12/2006   TONSILLECTOMY     TOTAL ABDOMINAL HYSTERECTOMY     Family History:  Family History  Problem Relation Age of Onset   Hypertension Mother    Hiatal hernia Mother    Diabetes Father    Hypertension Father    Heart attack Father    Alcoholism Father    Bipolar disorder Father    Mental illness Sister    Suicidality Sister    Colon cancer Maternal Aunt 9   Breast cancer Maternal Aunt  Bipolar disorder Paternal Aunt    Family Psychiatric  History: see H&P  Social History:  Social History   Substance and Sexual Activity  Alcohol Use Yes   Alcohol/week: 0.0 - 1.0 standard drinks   Comment: occasional wine     Social History   Substance and Sexual Activity  Drug Use No    Social History   Socioeconomic History   Marital status: Married    Spouse name: Ridhi Hoffert   Number of children: 3    Years of education: 14   Highest education level: Some college, no degree  Occupational History   Occupation: retired  Tobacco Use   Smoking status: Former    Packs/day: 1.50    Years: 40.00    Pack years: 60.00    Types: Cigarettes    Quit date: 07/02/2004    Years since quitting: 17.4    Passive exposure: Past   Smokeless tobacco: Never   Tobacco comments:    Verified by Aldona Lento (Husband), & Lauralee Evener (Daughter)  Vaping Use   Vaping Use: Never used  Substance and Sexual Activity   Alcohol use: Yes    Alcohol/week: 0.0 - 1.0 standard drinks    Comment: occasional wine   Drug use: No   Sexual activity: Never  Other Topics Concern   Not on file  Social History Narrative   Lives with husband and grandson she is raising.   11/07/21 Lives with husband   Social Determinants of Health   Financial Resource Strain: Medium Risk   Difficulty of Paying Living Expenses: Somewhat hard  Food Insecurity: No Food Insecurity   Worried About Charity fundraiser in the Last Year: Never true   Ran Out of Food in the Last Year: Never true  Transportation Needs: No Transportation Needs   Lack of Transportation (Medical): No   Lack of Transportation (Non-Medical): No  Physical Activity: Inactive   Days of Exercise per Week: 0 days   Minutes of Exercise per Session: 0 min  Stress: No Stress Concern Present   Feeling of Stress : Not at all  Social Connections: Socially Integrated   Frequency of Communication with Friends and Family: More than three times a week   Frequency of Social Gatherings with Friends and Family: More than three times a week   Attends Religious Services: 1 to 4 times per year   Active Member of [employer] or Organizations: Yes   Attends Music therapist: More than 4 times per year   Marital Status: Married   Additional Social History:                         Sleep: Fair  Appetite:  Fair  Current Medications: Current  Facility-Administered Medications  Medication Dose Route Frequency Provider Last Rate Last Admin   acetaminophen (TYLENOL) tablet 650 mg  650 mg Oral Q6H PRN Deloria Lair, NP   650 mg at 12/15/21 1826   alum & mag hydroxide-simeth (MAALOX/MYLANTA) 200-200-20 MG/5ML suspension 30 mL  30 mL Oral Q4H PRN Dixon, Rashaun M, NP       donepezil (ARICEPT) tablet 5 mg  5 mg Oral QHS Parks Ranger, DO   5 mg at 12/15/21 2122   DULoxetine (CYMBALTA) DR capsule 60 mg  60 mg Oral Daily Anette Riedel M, NP   60 mg at 12/16/21 0911   haloperidol (HALDOL) tablet 1 mg  1 mg Oral [id number] Parks Ranger, DO  1 mg at 12/16/21 0809   levothyroxine (SYNTHROID) tablet 50 mcg  50 mcg Oral QAC breakfast Anette Riedel M, NP   50 mcg at 12/16/21 0809   lidocaine (LIDODERM) 5 % 1 patch  1 patch Transdermal Daily PRN Rulon Sera, MD   1 patch at 12/14/21 6045   LORazepam (ATIVAN) tablet 1 mg  1 mg Oral Q4H PRN Parks Ranger, DO   1 mg at 12/16/21 0038   magnesium hydroxide (MILK OF MAGNESIA) suspension 30 mL  30 mL Oral Daily PRN Deloria Lair, NP       meloxicam (MOBIC) tablet 7.5 mg  7.5 mg Oral Daily Parks Ranger, DO   7.5 mg at 12/16/21 0911   OLANZapine (ZYPREXA) tablet 5 mg  5 mg Oral Q4H PRN Parks Ranger, DO   5 mg at 12/05/21 2108   propranolol (INDERAL) tablet 20 mg  20 mg Oral TID Deloria Lair, NP   20 mg at 12/16/21 0911   QUEtiapine (SEROQUEL) tablet 50 mg  50 mg Oral QHS Parks Ranger, DO   50 mg at 12/15/21 2122    Lab Results: No results found for this or any previous visit (from the past 48 hour(s)).  Blood Alcohol level:  Lab Results  Component Value Date   ETH <10 40/98/1191    Metabolic Disorder Labs: Lab Results  Component Value Date   HGBA1C 6.4 10/27/2021   No results found for: PROLACTIN Lab Results  Component Value Date   CHOL 186 10/19/2021   TRIG 140.0 10/19/2021   HDL 70.90 10/19/2021   CHOLHDL 3 10/19/2021    VLDL 28.0 10/19/2021   LDLCALC 87 10/19/2021   LDLCALC 99 09/22/2020    Physical Findings: AIMS:  , ,  ,  ,    CIWA:    COWS:     Musculoskeletal: Strength & Muscle Tone: within normal limits Gait & Station: unsteady Patient leans: N/A  Psychiatric Specialty Exam:  Presentation  General Appearance: fair hygiene and grooming (CNA helped her with hair). Eye Contact: fair Speech: wnl Speech Volume: regular Handedness:Right  Mood and Affect  Mood: euthymic Affect: appropriate, congruent to mood  Thought Process  Thought Processes: mostly organized Descriptions of Associations:Loose  Orientation: partial - not in date, but oriented in self, place. Thought Content: does not express delusions History of Schizophrenia/Schizoaffective disorder:No  Duration of Psychotic Symptoms:N/A  Hallucinations:denies Ideas of Reference:Delusions; Paranoia  Suicidal Thoughts: denies Homicidal Thoughts: denies  Sensorium  Memory:Remote Poor  Judgment:Impaired  Insight: very limited  Executive Functions  Concentration:Poor  Attention Span:Poor  Recall:Poor  Fund of Knowledge:Poor  Language:Poor   Psychomotor Activity  Psychomotor Activity:No data recorded  Assets  Assets:Financial Resources/Insurance; Housing; Intimacy; Social Support   Sleep  Sleep:No data recorded   Physical Exam: Physical Exam ROS Blood pressure (!) 110/98, pulse 77, temperature 97.9 F (36.6 C), resp. rate 18, height 5\' 2"  (1.575 m), weight 86.2 kg, SpO2 98 %. Body mass index is 34.75 kg/m.   Treatment Plan Summary: Daily contact with patient to assess and evaluate symptoms and progress in treatment and Medication management  Patient is a 76 year old female with the above-stated past psychiatric history who is seen in follow-up.  Chart reviewed. Patient discussed with nursing. Patient's behavior is appropriate. She denies any mental complaints. No changes in medicine. Social work is  working on discharge hopefully Monday.    Plan:  -continue inpatient psych admission; 15-minute checks; daily contact with patient to assess and  evaluate symptoms and progress in treatment; psychoeducation.  -continue scheduled medications:  donepezil  5 mg Oral QHS   DULoxetine  60 mg Oral Daily   haloperidol  1 mg Oral [id number]   levothyroxine  50 mcg Oral QAC breakfast   meloxicam  7.5 mg Oral Daily   propranolol  20 mg Oral TID   QUEtiapine  50 mg Oral QHS    -continue PRN medications.  acetaminophen, alum & mag hydroxide-simeth, lidocaine, LORazepam, magnesium hydroxide, OLANZapine  -Disposition: Estimated duration of hospitalization: possible d/c on Monday  -  I certify that the patient does need, on a daily basis, active treatment furnished directly by or requiring the supervision of inpatient psychiatric facility personnel.    Larita Fife, MD 12/16/2021, 2:04 PM

## 2021-12-16 NOTE — Progress Notes (Signed)
Patient is pleasant and cooperative. She denies SI and HI. Patient states that she was having visual hallucinations of a receipt following her during the night, but that she is no longer hallucinating. Patient also told this Probation officer, "I saw your sister on TV last night." Patient is animated. Speech is tangential. Patient asked about her medications and was given education on medications. Patient is compliant with scheduled medications. Patient remains safe on the unit at this time.

## 2021-12-17 DIAGNOSIS — R4182 Altered mental status, unspecified: Secondary | ICD-10-CM | POA: Diagnosis not present

## 2021-12-17 MED ORDER — QUETIAPINE FUMARATE 25 MG PO TABS
75.0000 mg | ORAL_TABLET | Freq: Every day | ORAL | Status: DC
  Administered 2021-12-17: 75 mg via ORAL
  Filled 2021-12-17: qty 3

## 2021-12-17 NOTE — Plan of Care (Signed)
Patient presents A&O x 2.  Affect is pleasant, animated, calm and cooperative. Patient reports sleeping well "except for the family that is hiding out here with a dog that smells." Denies AVH, SI, HI, anxiety or depression. Reports chronic lower back pain rating 4/10 - Lidoderm patch applied as ordered with relief reported.   VSS.  Patient compliant with all scheduled meds. Will continue Q15 minute safety check rounds per unit protocol.  Problem: Education: Goal: Knowledge of [facility] Education information/materials will improve Outcome: Progressing Goal: Emotional status will improve Outcome: Progressing Goal: Mental status will improve Outcome: Progressing Goal: Verbalization of understanding the information provided will improve Outcome: Progressing   Problem: Activity: Goal: Interest or engagement in activities will improve Outcome: Progressing Goal: Sleeping patterns will improve Outcome: Progressing   Problem: Health Behavior/Discharge Planning: Goal: Compliance with treatment plan for underlying cause of condition will improve Outcome: Progressing

## 2021-12-17 NOTE — Progress Notes (Signed)
[facility] MD Progress Note  12/17/2021 12:32 PM Megan Duncan  MRN:  [id number]  Principal Problem: Altered mental status Diagnosis: Principal Problem:   Altered mental status  Patient is a  76y.o. female who presents to the [facility] in the context of worsening cognition, agitation, and depression.  Interval History Patient was seen today for re-evaluation.  Nursing reports no events overnight. The patient has no issues with performing ADLs.  Patient has been medication compliant.    Subjective:  On assessment patient reports feeling good for the most part. Her husband visited her last night. She denies any mood-related complaints; denies feeling depressed, says her anxiety is mild. No physical complaints today. She denies suicidal/homicidal ideations. She is delusional that there is a dog in the next room, states she heard the dog and possibly saw it.The patient reports no side effects from medications.    Labs: no new results for review.     Total Time spent with patient: 20 minutes  Past Psychiatric History: see H&P   Past Medical History:  Past Medical History:  Diagnosis Date   Acute pain of right shoulder 09/24/2018   Acute upper respiratory infection 09/19/2010   Allergic rhinitis 10/03/2016   Anterolisthesis of lumbar spine    Arthritis    hands   Bilateral hip pain 09/02/2014   Bipolar II disorder    Cellulitis and abscess of trunk 09/15/2009   Ceruminosis 03/04/2014   Cervicalgia 06/20/2007   Chronic low back pain 06/20/2007   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-sided sciatica 08/06/2012   COPD (chronic obstructive pulmonary disease) ([place])    Deformity of finger 07/15/2014   Degeneration of lumbar intervertebral disc 08/22/2020   Degenerative lumbar spinal stenosis    Dermatitis 48/54/6270   Diastolic dysfunction    Per pt, diagnosed after [facility]   Diverticulosis    DOE (dyspnea on exertion) 07/20/2016   Essential (primary) hypertension 04/23/2007   Estrogen  deficiency 09/01/2017   External hemorrhoid 08/01/2015   Fibromyalgia    Foraminal stenosis of lumbar region 08/22/2020   Generalized anxiety disorder 09/01/2018   Hyperlipidemia    Inflammatory disease of uterus 04/23/2007   Left flank pain 07/13/2021   Left upper quadrant pain 09/01/2017   Lower abdominal pain 08/06/2012   Lumbar back pain with radiculopathy affecting left lower extremity 01/24/2010   Lumbar radiculopathy 01/26/2021   Lump in upper inner quadrant of left breast 07/13/2021   Major depressive disorder    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   Otitis media 09/05/2010   Palpitations 07/20/2016   Peripheral neuropathy 02/22/2010   Retrolisthesis of vertebrae 08/22/2020   Shingles outbreak 04/14/2015   Sinusitis, acute maxillary 09/14/2013   Stress incontinence, female 08/01/2015   Thoracic aortic atherosclerosis 05/14/2017   Tinea corporis 09/01/2017   Tinnitus 03/04/2014   Torticollis 03/04/2014   Type II diabetes mellitus 06/05/2018    Past Surgical History:  Procedure Laterality Date   APPENDECTOMY     EYE SURGERY     KNEE ARTHROSCOPY Right 11/12/2006   TONSILLECTOMY     TOTAL ABDOMINAL HYSTERECTOMY     Family History:  Family History  Problem Relation Age of Onset   Hypertension Mother    Hiatal hernia Mother    Diabetes Father    Hypertension Father    Heart attack Father    Alcoholism Father    Bipolar disorder Father    Mental illness Sister    Suicidality Sister    Colon  cancer Maternal Aunt 80   Breast cancer Maternal Aunt    Bipolar disorder Paternal Aunt    Family Psychiatric  History: see H&P  Social History:  Social History   Substance and Sexual Activity  Alcohol Use Yes   Alcohol/week: 0.0 - 1.0 standard drinks   Comment: occasional wine     Social History   Substance and Sexual Activity  Drug Use No    Social History   Socioeconomic History   Marital status: Married    Spouse name: Levon Penning   Number of children: 3   Years of education: 14   Highest education level: Some college, no degree  Occupational History   Occupation: retired  Tobacco Use   Smoking status: Former    Packs/day: 1.50    Years: 40.00    Pack years: 60.00    Types: Cigarettes    Quit date: 07/02/2004    Years since quitting: 17.4    Passive exposure: Past   Smokeless tobacco: Never   Tobacco comments:    Verified by Aldona Lento (Husband), & Lauralee Evener (Daughter)  Vaping Use   Vaping Use: Never used  Substance and Sexual Activity   Alcohol use: Yes    Alcohol/week: 0.0 - 1.0 standard drinks    Comment: occasional wine   Drug use: No   Sexual activity: Never  Other Topics Concern   Not on file  Social History Narrative   Lives with husband and grandson she is raising.   11/07/21 Lives with husband   Social Determinants of Health   Financial Resource Strain: Medium Risk   Difficulty of Paying Living Expenses: Somewhat hard  Food Insecurity: No Food Insecurity   Worried About Charity fundraiser in the Last Year: Never true   Ran Out of Food in the Last Year: Never true  Transportation Needs: No Transportation Needs   Lack of Transportation (Medical): No   Lack of Transportation (Non-Medical): No  Physical Activity: Inactive   Days of Exercise per Week: 0 days   Minutes of Exercise per Session: 0 min  Stress: No Stress Concern Present   Feeling of Stress : Not at all  Social Connections: Socially Integrated   Frequency of Communication with Friends and Family: More than three times a week   Frequency of Social Gatherings with Friends and Family: More than three times a week   Attends Religious Services: 1 to 4 times per year   Active Member of [employer] or Organizations: Yes   Attends Music therapist: More than 4 times per year   Marital Status: Married   Additional Social History:                         Sleep: Fair  Appetite:  Fair  Current  Medications: Current Facility-Administered Medications  Medication Dose Route Frequency Provider Last Rate Last Admin   acetaminophen (TYLENOL) tablet 650 mg  650 mg Oral Q6H PRN Deloria Lair, NP   650 mg at 12/16/21 1736   alum & mag hydroxide-simeth (MAALOX/MYLANTA) 200-200-20 MG/5ML suspension 30 mL  30 mL Oral Q4H PRN Dixon, Rashaun M, NP       donepezil (ARICEPT) tablet 5 mg  5 mg Oral QHS Parks Ranger, DO   5 mg at 12/16/21 2109   DULoxetine (CYMBALTA) DR capsule 60 mg  60 mg Oral Daily Anette Riedel M, NP   60 mg at 12/17/21 0908   haloperidol (  HALDOL) tablet 1 mg  1 mg Oral BH-q8a4p Parks Ranger, DO   1 mg at 12/17/21 0900   levothyroxine (SYNTHROID) tablet 50 mcg  50 mcg Oral QAC breakfast Anette Riedel M, NP   50 mcg at 12/17/21 0908   lidocaine (LIDODERM) 5 % 1 patch  1 patch Transdermal Daily PRN Rulon Sera, MD   1 patch at 12/14/21 1610   LORazepam (ATIVAN) tablet 1 mg  1 mg Oral Q4H PRN Parks Ranger, DO   1 mg at 12/16/21 2109   magnesium hydroxide (MILK OF MAGNESIA) suspension 30 mL  30 mL Oral Daily PRN Deloria Lair, NP       meloxicam (MOBIC) tablet 7.5 mg  7.5 mg Oral Daily Parks Ranger, DO   7.5 mg at 12/17/21 0917   OLANZapine (ZYPREXA) tablet 5 mg  5 mg Oral Q4H PRN Parks Ranger, DO   5 mg at 12/16/21 2109   propranolol (INDERAL) tablet 20 mg  20 mg Oral TID Deloria Lair, NP   20 mg at 12/17/21 0908   QUEtiapine (SEROQUEL) tablet 50 mg  50 mg Oral QHS Parks Ranger, DO   50 mg at 12/16/21 2109    Lab Results: No results found for this or any previous visit (from the past 48 hour(s)).  Blood Alcohol level:  Lab Results  Component Value Date   ETH <10 96/02/5408    Metabolic Disorder Labs: Lab Results  Component Value Date   HGBA1C 6.4 10/27/2021   No results found for: PROLACTIN Lab Results  Component Value Date   CHOL 186 10/19/2021   TRIG 140.0 10/19/2021   HDL 70.90 10/19/2021    CHOLHDL 3 10/19/2021   VLDL 28.0 10/19/2021   LDLCALC 87 10/19/2021   LDLCALC 99 09/22/2020    Physical Findings: AIMS:  , ,  ,  ,    CIWA:    COWS:     Musculoskeletal: Strength & Muscle Tone: within normal limits Gait & Station: unsteady Patient leans: N/A  Psychiatric Specialty Exam:  Presentation  General Appearance: fair hygiene and grooming (CNA helped her with hair). Eye Contact: fair Speech: wnl Speech Volume: regular Handedness:Right  Mood and Affect  Mood: euthymic Affect: appropriate, congruent to mood  Thought Process  Thought Processes: mostly organized Descriptions of Associations:Loose  Orientation: partial - not in date, but oriented in self, place. Thought Content: does not express delusions History of Schizophrenia/Schizoaffective disorder:No  Duration of Psychotic Symptoms:N/A  Hallucinations: reports hearing and seeing a dog in the unit Ideas of Reference:Delusions; Paranoia  Suicidal Thoughts: denies Homicidal Thoughts: denies  Sensorium  Memory:Remote Poor  Judgment:Impaired  Insight: very limited  Executive Functions  Concentration:Poor  Attention Span:Poor  Recall:Poor  Fund of Knowledge:Poor  Language:Poor   Psychomotor Activity  Psychomotor Activity:No data recorded  Assets  Assets:Financial Resources/Insurance; Housing; Intimacy; Social Support   Sleep  Sleep:No data recorded   Physical Exam: Physical Exam ROS Blood pressure (!) 121/47, pulse (!) 56, temperature 97.7 F (36.5 C), temperature source Oral, resp. rate 16, height 5\' 2"  (1.575 m), weight 86.2 kg, SpO2 100 %. Body mass index is 34.75 kg/m.   Treatment Plan Summary: Daily contact with patient to assess and evaluate symptoms and progress in treatment and Medication management  Patient is a 76 year old female with the above-stated past psychiatric history who is seen in follow-up.  Chart reviewed. Patient discussed with nursing. Patient's  behavior is appropriate. Occasional hallucinations of hearing and seeing a  dog in the unit, so I would like to increase the dos of Seroquel to 75 mg tonight for psychosis.   Plan:  -continue inpatient psych admission; 15-minute checks; daily contact with patient to assess and evaluate symptoms and progress in treatment; psychoeducation.  -continue scheduled medications:  donepezil  5 mg Oral QHS   DULoxetine  60 mg Oral Daily   haloperidol  1 mg Oral [id number]   levothyroxine  50 mcg Oral QAC breakfast   meloxicam  7.5 mg Oral Daily   propranolol  20 mg Oral TID   QUEtiapine  75 mg Oral QHS     -continue PRN medications.  acetaminophen, alum & mag hydroxide-simeth, lidocaine, LORazepam, magnesium hydroxide, OLANZapine  -Disposition: Estimated duration of hospitalization: possible d/c on Monday  -  I certify that the patient does need, on a daily basis, active treatment furnished directly by or requiring the supervision of inpatient psychiatric facility personnel.    Larita Fife, MD 12/17/2021, 12:32 PM

## 2021-12-17 NOTE — Group Note (Signed)
LCSW Group Therapy Note  Group Date: 12/17/2021 Start Time: 0240 End Time: 1415   Type of Therapy and Topic:  Group Therapy - How To Cope with Nervousness about Discharge   Participation Level:  Active   Description of Group This process group involved identification of patients' feelings about discharge. Some of them are scheduled to be discharged soon, while others are new admissions, but each of them was asked to share thoughts and feelings surrounding discharge from the hospital. One common theme was that they are excited at the prospect of going home, while another was that many of them are apprehensive about sharing why they were hospitalized. Patients were given the opportunity to discuss these feelings with their peers in preparation for discharge.  Therapeutic Goals  Patient will identify their overall feelings about pending discharge. Patient will think about how they might proactively address issues that they believe will once again arise once they get home (i.e. with parents). Patients will participate in discussion about having hope for change.   Summary of Patient Progress:  Patient was present for the entirety of the group session. Patient was an active listener and participated in the topic of discussion, provided helpful advice to others, and added nuance to topic of conversation. Patient presented as amiable and pleasant. Patient shared that she has felt relief from her back pain over the past 2 days. Patient reflected on her time being hospitalized, sharing that she feels that the experience has brought her family closer. Patient shared, "I feel the love my family has for me." Patient expressed hope about discharging from the hospital, sharing that she plans to "keep moving forward." Patient expressed a goal of attending a swim class at least 1x per week to help her manage back pain.    Therapeutic Modalities Cognitive Behavioral Therapy   Berniece Salines,  Latanya Presser 12/17/2021  2:55 PM

## 2021-12-17 NOTE — Progress Notes (Signed)
Patient is A & O 3.  She is medication compliant. Patients spouse visited during night shift. She interacted with others in the day room during snack.  Denies SI, HI, or AVH. No new behavioral issues during night shift. She ambulated without walker on night shift. Q15 minute safety check rounds per unit protocol.

## 2021-12-18 ENCOUNTER — Other Ambulatory Visit: Payer: Medicare Other

## 2021-12-18 DIAGNOSIS — R4182 Altered mental status, unspecified: Secondary | ICD-10-CM | POA: Diagnosis not present

## 2021-12-18 DIAGNOSIS — F315 Bipolar disorder, current episode depressed, severe, with psychotic features: Secondary | ICD-10-CM | POA: Diagnosis not present

## 2021-12-18 MED ORDER — PROPRANOLOL HCL 20 MG PO TABS
20.0000 mg | ORAL_TABLET | Freq: Two times a day (BID) | ORAL | Status: DC
  Administered 2021-12-18 – 2021-12-22 (×8): 20 mg via ORAL
  Filled 2021-12-18 (×9): qty 1

## 2021-12-18 MED ORDER — PRAZOSIN HCL 2 MG PO CAPS
2.0000 mg | ORAL_CAPSULE | Freq: Every day | ORAL | Status: DC
  Administered 2021-12-18 – 2021-12-21 (×4): 2 mg via ORAL
  Filled 2021-12-18 (×4): qty 1

## 2021-12-18 MED ORDER — METFORMIN HCL 500 MG PO TABS
500.0000 mg | ORAL_TABLET | Freq: Two times a day (BID) | ORAL | Status: DC
  Administered 2021-12-18 – 2021-12-21 (×7): 500 mg via ORAL
  Filled 2021-12-18 (×6): qty 1

## 2021-12-18 MED ORDER — QUETIAPINE FUMARATE 100 MG PO TABS
100.0000 mg | ORAL_TABLET | Freq: Every day | ORAL | Status: DC
  Administered 2021-12-18 – 2021-12-20 (×3): 100 mg via ORAL
  Filled 2021-12-18 (×3): qty 1

## 2021-12-18 NOTE — Progress Notes (Signed)
[facility] MD Progress Note  12/18/2021 11:35 AM Megan Duncan  MRN:  [id number] Subjective: She continues to have hallucinations or nightmares at nighttime and walks in her sleep.  She is doing a lot better though.  She is taking her medicines as prescribed and denies any side effects.  She is conversing much more appropriately.  She does have moments of tangentiality and confusion but overall she is doing better.  There is no evidence of EPS or TD.  She gets propranolol 3 times a day Soma changed to twice a day and added Minipress at nighttime for her nightmares.  Principal Problem: Altered mental status Diagnosis: Principal Problem:   Altered mental status  Total Time spent with patient: 15 minutes  Past Psychiatric History: Bipolar Disorder  Past Medical History:  Past Medical History:  Diagnosis Date   Acute pain of right shoulder 09/24/2018   Acute upper respiratory infection 09/19/2010   Allergic rhinitis 10/03/2016   Anterolisthesis of lumbar spine    Arthritis    hands   Bilateral hip pain 09/02/2014   Bipolar II disorder    Cellulitis and abscess of trunk 09/15/2009   Ceruminosis 03/04/2014   Cervicalgia 06/20/2007   Chronic low back pain 06/20/2007   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-sided sciatica 08/06/2012   COPD (chronic obstructive pulmonary disease) ([place])    Deformity of finger 07/15/2014   Degeneration of lumbar intervertebral disc 08/22/2020   Degenerative lumbar spinal stenosis    Dermatitis 18/29/9371   Diastolic dysfunction    Per pt, diagnosed after [facility]   Diverticulosis    DOE (dyspnea on exertion) 07/20/2016   Essential (primary) hypertension 04/23/2007   Estrogen deficiency 09/01/2017   External hemorrhoid 08/01/2015   Fibromyalgia    Foraminal stenosis of lumbar region 08/22/2020   Generalized anxiety disorder 09/01/2018   Hyperlipidemia    Inflammatory disease of uterus 04/23/2007   Left flank pain 07/13/2021   Left upper quadrant pain  09/01/2017   Lower abdominal pain 08/06/2012   Lumbar back pain with radiculopathy affecting left lower extremity 01/24/2010   Lumbar radiculopathy 01/26/2021   Lump in upper inner quadrant of left breast 07/13/2021   Major depressive disorder    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   Otitis media 09/05/2010   Palpitations 07/20/2016   Peripheral neuropathy 02/22/2010   Retrolisthesis of vertebrae 08/22/2020   Shingles outbreak 04/14/2015   Sinusitis, acute maxillary 09/14/2013   Stress incontinence, female 08/01/2015   Thoracic aortic atherosclerosis 05/14/2017   Tinea corporis 09/01/2017   Tinnitus 03/04/2014   Torticollis 03/04/2014   Type II diabetes mellitus 06/05/2018    Past Surgical History:  Procedure Laterality Date   APPENDECTOMY     EYE SURGERY     KNEE ARTHROSCOPY Right 11/12/2006   TONSILLECTOMY     TOTAL ABDOMINAL HYSTERECTOMY     Family History:  Family History  Problem Relation Age of Onset   Hypertension Mother    Hiatal hernia Mother    Diabetes Father    Hypertension Father    Heart attack Father    Alcoholism Father    Bipolar disorder Father    Mental illness Sister    Suicidality Sister    Colon cancer Maternal Aunt 80   Breast cancer Maternal Aunt    Bipolar disorder Paternal Aunt     Social History:  Social History   Substance and Sexual Activity  Alcohol Use Yes   Alcohol/week: 0.0 - 1.0 standard drinks  Comment: occasional wine     Social History   Substance and Sexual Activity  Drug Use No    Social History   Socioeconomic History   Marital status: Married    Spouse name: Margaret Cockerill   Number of children: 3   Years of education: 14   Highest education level: Some college, no degree  Occupational History   Occupation: retired  Tobacco Use   Smoking status: Former    Packs/day: 1.50    Years: 40.00    Pack years: 60.00    Types: Cigarettes    Quit date: 07/02/2004    Years since  quitting: 17.4    Passive exposure: Past   Smokeless tobacco: Never   Tobacco comments:    Verified by Aldona Lento (Husband), & Lauralee Evener (Daughter)  Vaping Use   Vaping Use: Never used  Substance and Sexual Activity   Alcohol use: Yes    Alcohol/week: 0.0 - 1.0 standard drinks    Comment: occasional wine   Drug use: No   Sexual activity: Never  Other Topics Concern   Not on file  Social History Narrative   Lives with husband and grandson she is raising.   11/07/21 Lives with husband   Social Determinants of Health   Financial Resource Strain: Medium Risk   Difficulty of Paying Living Expenses: Somewhat hard  Food Insecurity: No Food Insecurity   Worried About Charity fundraiser in the Last Year: Never true   Ran Out of Food in the Last Year: Never true  Transportation Needs: No Transportation Needs   Lack of Transportation (Medical): No   Lack of Transportation (Non-Medical): No  Physical Activity: Inactive   Days of Exercise per Week: 0 days   Minutes of Exercise per Session: 0 min  Stress: No Stress Concern Present   Feeling of Stress : Not at all  Social Connections: Socially Integrated   Frequency of Communication with Friends and Family: More than three times a week   Frequency of Social Gatherings with Friends and Family: More than three times a week   Attends Religious Services: 1 to 4 times per year   Active Member of [employer] or Organizations: Yes   Attends Music therapist: More than 4 times per year   Marital Status: Married   Additional Social History:                         Sleep: Fair  Appetite:  Good  Current Medications: Current Facility-Administered Medications  Medication Dose Route Frequency Provider Last Rate Last Admin   acetaminophen (TYLENOL) tablet 650 mg  650 mg Oral Q6H PRN Deloria Lair, NP   650 mg at 12/18/21 0850   alum & mag hydroxide-simeth (MAALOX/MYLANTA) 200-200-20 MG/5ML suspension 30 mL  30 mL  Oral Q4H PRN Deloria Lair, NP       donepezil (ARICEPT) tablet 5 mg  5 mg Oral QHS Parks Ranger, DO   5 mg at 12/17/21 2109   DULoxetine (CYMBALTA) DR capsule 60 mg  60 mg Oral Daily Dixon, Rashaun M, NP   60 mg at 12/18/21 1018   haloperidol (HALDOL) tablet 1 mg  1 mg Oral BH-q8a4p Parks Ranger, DO   1 mg at 12/18/21 0809   levothyroxine (SYNTHROID) tablet 50 mcg  50 mcg Oral QAC breakfast Anette Riedel M, NP   50 mcg at 12/18/21 0800   lidocaine (LIDODERM) 5 % 1 patch  1 patch Transdermal Daily PRN Rulon Sera, MD   1 patch at 12/14/21 5366   LORazepam (ATIVAN) tablet 1 mg  1 mg Oral Q4H PRN Parks Ranger, DO   1 mg at 12/18/21 0104   magnesium hydroxide (MILK OF MAGNESIA) suspension 30 mL  30 mL Oral Daily PRN Deloria Lair, NP       meloxicam (MOBIC) tablet 7.5 mg  7.5 mg Oral Daily Parks Ranger, DO   7.5 mg at 12/18/21 1020   metFORMIN (GLUCOPHAGE) tablet 500 mg  500 mg Oral BID WC Parks Ranger, DO       OLANZapine [facility]) tablet 5 mg  5 mg Oral Q4H PRN Parks Ranger, DO   5 mg at 12/16/21 2109   prazosin (MINIPRESS) capsule 2 mg  2 mg Oral QHS Parks Ranger, DO       propranolol (INDERAL) tablet 20 mg  20 mg Oral BID WC Parks Ranger, DO       QUEtiapine (SEROQUEL) tablet 100 mg  100 mg Oral QHS Parks Ranger, DO        Lab Results: No results found for this or any previous visit (from the past 48 hour(s)).  Blood Alcohol level:  Lab Results  Component Value Date   ETH <10 44/01/4741    Metabolic Disorder Labs: Lab Results  Component Value Date   HGBA1C 6.4 10/27/2021   No results found for: PROLACTIN Lab Results  Component Value Date   CHOL 186 10/19/2021   TRIG 140.0 10/19/2021   HDL 70.90 10/19/2021   CHOLHDL 3 10/19/2021   VLDL 28.0 10/19/2021   LDLCALC 87 10/19/2021   LDLCALC 99 09/22/2020    Physical Findings: AIMS:  , ,  ,  ,    CIWA:    COWS:      Musculoskeletal: Strength & Muscle Tone: within normal limits Gait & Station: normal Patient leans: N/A  Psychiatric Specialty Exam:  Presentation  General Appearance: Bizarre; Disheveled  Eye Contact:Minimal  Speech:Pressured  Speech Volume:Increased  Handedness:Right   Mood and Affect  Mood:Angry; Depressed; Dysphoric; Hopeless  Affect:Depressed; Tearful; Full Range   Thought Process  Thought Processes:Disorganized; Irrevelant  Descriptions of Associations:Loose  Orientation:Partial  Thought Content:Delusions; Illogical; Scattered; Paranoid Ideation  History of Schizophrenia/Schizoaffective disorder:No  Duration of Psychotic Symptoms:N/A  Hallucinations:No data recorded Ideas of Reference:Delusions; Paranoia  Suicidal Thoughts:No data recorded Homicidal Thoughts:No data recorded  Sensorium  Memory:Remote Poor  Judgment:Impaired  Insight:None   Executive Functions  Concentration:Poor  Attention Span:Poor  Recall:Poor  Fund of Knowledge:Poor  Language:Poor   Psychomotor Activity  Psychomotor Activity:No data recorded  Assets  Assets:Financial Resources/Insurance; Housing; Intimacy; Social Support   Sleep  Sleep:No data recorded   Physical Exam: Physical Exam Vitals and nursing note reviewed.  Constitutional:      Appearance: Normal appearance. She is normal weight.  Neurological:     General: No focal deficit present.     Mental Status: She is alert and oriented to person, place, and time.  Psychiatric:        Attention and Perception: Attention and perception normal.        Mood and Affect: Mood and affect normal.        Speech: Speech normal.        Behavior: Behavior normal. Behavior is cooperative.        Thought Content: Thought content normal.        Cognition and Memory: Cognition is impaired. Memory is  impaired.        Judgment: Judgment normal.   Review of Systems  Psychiatric/Behavioral:  Positive for memory  loss. The patient has insomnia.   Blood pressure 132/66, pulse 75, temperature 98.2 F (36.8 C), temperature source Oral, resp. rate 20, height 5\' 2"  (1.575 m), weight 86.2 kg, SpO2 98 %. Body mass index is 34.75 kg/m.   Treatment Plan Summary: Daily contact with patient to assess and evaluate symptoms and progress in treatment, Medication management, and Plan decrease propranolol to twice a day and add Minipress 2 mg at bedtime.  Increase Seroquel to 100 mg at bedtime.  Parks Ranger, DO 12/18/2021, 11:35 AM

## 2021-12-18 NOTE — Progress Notes (Signed)
Patient interacting well with Peers and Staff, ambulating with front wheel walker this shift. All fall protocol in place. Patient woke up at 0100 screaming for help. Patient stated she was dreaming in a beauty pageant and later was in her room with on her bed and not able to get up and woke up screaming for help. Patient was anxious, PRN ativan 1 mg given and effective Pt was able to sleep.   Support and encouragement provided.

## 2021-12-18 NOTE — Plan of Care (Signed)
Patient presents A&O x 2.  Affect is pleasant, animated, calm and cooperative. Patient reports sleeping well "except for a bad dream that I was in a beauty pageant and someone was trying to kill me." Denies AVH, SI, HI, anxiety or depression. Denies pain.  VSS.  Patient compliant with all scheduled meds. Will continue Q15 minute safety check rounds per unit protocol.  Problem: Education: Goal: Knowledge of [facility] Education information/materials will improve Outcome: Progressing Goal: Emotional status will improve Outcome: Progressing Goal: Mental status will improve Outcome: Progressing   Problem: Activity: Goal: Interest or engagement in activities will improve Outcome: Progressing Goal: Sleeping patterns will improve Outcome: Progressing   Problem: Health Behavior/Discharge Planning: Goal: Compliance with treatment plan for underlying cause of condition will improve Outcome: Progressing

## 2021-12-18 NOTE — Progress Notes (Signed)
Recreation Therapy Notes  Date: 12/18/2021  Time: 1:25 PM    Location: Courtyard     Behavioral response: N/A   Intervention Topic: Leisure   Discussion/Intervention: Patient refused to attend group.   Clinical Observations/Feedback:  Patient refused to attend group.   La [place]han LRT/CTRS          Trino Higinbotham 12/18/2021 3:42 PM

## 2021-12-18 NOTE — Progress Notes (Signed)
Physical Therapy Treatment Patient Details Name: Megan Duncan MRN: [id number] DOB: June 02, 1946 Today's Date: 12/18/2021   History of Present Illness Pt is a 76 yo female that presented to the ED due to concerns of AMS, hallucinations, recent multiple ED visits. PMH of bipolar disorder, cervicalagia, chronic LBP, COPD, fibromyalgia, dementia.    PT Comments    Pt resting in bed upon PT entrance into room and woke to tactile and verbal cue. She denies being in any pain currently at rest. Pt is able to perform bed mobility and sit to stand w/ supervision. She was able to ambulate ~184ft using w/ supervision and RW, but required verbal cues about maintaining upright posture, looking forward, and straightening out RW due to drifting to the left. She reported she does not use a RW at home, as a result PT had her ambulate ~68ft w/ CGA to assess gait w/o AD. Pt had some increased forward shuffling and slight LOB w/ easy recovery as a result of ambulation w/o RW; still advise to use RW to increase safety and reduce risk of falls. Pt will benefit from continued skilled PT in order to improve LE strength, mobility, balance, gait, and restore PLOF. Current discharge recommendation remains appropriate due to the level of assistance required by the patient to ensure safety and improve overall function.   Recommendations for follow up therapy are one component of a multi-disciplinary discharge planning process, led by the attending physician.  Recommendations may be updated based on patient status, additional functional criteria and insurance authorization.  Follow Up Recommendations  Other (comment) (Pt requires 24/7 supervision at discharge, with follow up PT to maximize pt function, mobility, and safety)     Assistance Recommended at Discharge Frequent or constant Supervision/Assistance  Patient can return home with the following A little help with walking and/or transfers;A little help with  bathing/dressing/bathroom;Assistance with cooking/housework;Direct supervision/assist for financial management;Assist for transportation;Direct supervision/assist for medications management;Help with stairs or ramp for entrance   Equipment Recommendations  Rolling walker (2 wheels)    Recommendations for Other Services       Precautions / Restrictions Precautions Precautions: Fall Restrictions Weight Bearing Restrictions: No     Mobility  Bed Mobility Overal bed mobility: Needs Assistance Bed Mobility: Supine to Sit, Sit to Supine     Supine to sit: Supervision, HOB elevated Sit to supine: Supervision, HOB elevated        Transfers Overall transfer level: Needs assistance Equipment used: Rolling walker (2 wheels) Transfers: Sit to/from Stand Sit to Stand: Supervision                Ambulation/Gait   Gait Distance (Feet):  (able to ambulate 147ft w/ RW; stop at nursing station for medication, and then ambulate another 108ft back to room w/o RW.) Assistive device: Rolling walker (2 wheels) Gait Pattern/deviations: Decreased step length - right, Decreased step length - left, Decreased stride length       General Gait Details: pt able to walk upright, with proper posture and RW positioning with cueing.improved tolerance noted today; shuffling gait/slight LOB when AD removed.   Stairs             Wheelchair Mobility    Modified Rankin (Stroke Patients Only)       Balance Overall balance assessment: Needs assistance Sitting-balance support: Feet supported Sitting balance-Leahy Scale: Good       Standing balance-Leahy Scale: Fair  Cognition Arousal/Alertness: Awake/alert Behavior During Therapy: WFL for tasks assessed/performed Overall Cognitive Status: No family/caregiver present to determine baseline cognitive functioning                                 General Comments: oriented to self,  location        Exercises      General Comments        Pertinent Vitals/Pain Pain Assessment Pain Assessment: No/denies pain    Home Living                          Prior Function            PT Goals (current goals can now be found in the care plan section) Progress towards PT goals: Progressing toward goals    Frequency           PT Plan Current plan remains appropriate    Co-evaluation              AM-PAC PT "6 Clicks" Mobility   Outcome Measure  Help needed turning from your back to your side while in a flat bed without using bedrails?: None Help needed moving from lying on your back to sitting on the side of a flat bed without using bedrails?: None Help needed moving to and from a bed to a chair (including a wheelchair)?: None Help needed standing up from a chair using your arms (e.g., wheelchair or bedside chair)?: None Help needed to walk in hospital room?: A Little Help needed climbing 3-5 steps with a railing? : A Little 6 Click Score: 22    End of Session Equipment Utilized During Treatment: Gait belt Activity Tolerance: Patient tolerated treatment well Patient left: in bed Nurse Communication: Mobility status PT Visit Diagnosis: Other abnormalities of gait and mobility (R26.89);Difficulty in walking, not elsewhere classified (R26.2)     Time: 1610-9604 PT Time Calculation (min) (ACUTE ONLY): 13 min  Charges:                        Jonnie Kind, SPT 12/18/2021, 11:37 AM

## 2021-12-19 ENCOUNTER — Ambulatory Visit (INDEPENDENT_AMBULATORY_CARE_PROVIDER_SITE_OTHER): Payer: Medicare Other | Admitting: *Deleted

## 2021-12-19 DIAGNOSIS — E1169 Type 2 diabetes mellitus with other specified complication: Secondary | ICD-10-CM

## 2021-12-19 DIAGNOSIS — J449 Chronic obstructive pulmonary disease, unspecified: Secondary | ICD-10-CM

## 2021-12-19 DIAGNOSIS — F315 Bipolar disorder, current episode depressed, severe, with psychotic features: Secondary | ICD-10-CM | POA: Diagnosis not present

## 2021-12-19 DIAGNOSIS — F0670 Mild neurocognitive disorder due to known physiological condition without behavioral disturbance: Secondary | ICD-10-CM

## 2021-12-19 DIAGNOSIS — G609 Hereditary and idiopathic neuropathy, unspecified: Secondary | ICD-10-CM

## 2021-12-19 DIAGNOSIS — R4182 Altered mental status, unspecified: Secondary | ICD-10-CM | POA: Diagnosis not present

## 2021-12-19 DIAGNOSIS — E1151 Type 2 diabetes mellitus with diabetic peripheral angiopathy without gangrene: Secondary | ICD-10-CM

## 2021-12-19 DIAGNOSIS — E785 Hyperlipidemia, unspecified: Secondary | ICD-10-CM

## 2021-12-19 DIAGNOSIS — F3181 Bipolar II disorder: Secondary | ICD-10-CM

## 2021-12-19 DIAGNOSIS — M797 Fibromyalgia: Secondary | ICD-10-CM

## 2021-12-19 NOTE — Progress Notes (Signed)
Recreation Therapy Notes Date: 12/19/2021  Time: 1:20 pm    Location: Craft room    Behavioral response: Appropriate  Intervention Topic: Goals   Discussion/Intervention:  Group content on today was focused on goals. Patients described what goals are and how they define goals. Individuals expressed how they go about setting goals and reaching them. The group identified how important goals are and if they make short term goals to reach long term goals. Patients described how many goals they work on at a time and what affects them not reaching their goal. Individuals described how much time they put into planning and obtaining their goals. The group participated in the intervention My Goal Board and made personal goal boards to help them achieve their goal. Clinical Observations/Feedback: Patient came to group and identified that her goal is to write a book, a mystery. She stated that to reach goals you must be flexible and have some sort of structure. Individual was social with peers and staff while participating in the intervention.  Tionna Gigante LRT/CTRS         Ayomide Zuleta 12/19/2021 3:24 PM

## 2021-12-19 NOTE — Progress Notes (Signed)
Patient is calm and cooperative with assessment. Speech is tangential, but patient engages in conversation and is pleasant and animated. Patient denies SI, HI, and AVH. She complains of some back pain and was given Mobic with her scheduled medications. Patient compliant with all medications. Patient is active on the unit and ate breakfast and watched TV in the dayroom. Patient is observed to be interacting appropriately with staff and other patients on the unit. Patient remains safe on the unit at this time.

## 2021-12-19 NOTE — Plan of Care (Signed)
°  Problem: Group Participation Goal: STG - Patient will engage in groups with a calm and appropriate mood at least 2x within 5 recreation therapy group sessions Description: STG - Patient will engage in groups with a calm and appropriate mood at least 2x within 5 recreation therapy group sessions Outcome: Progressing

## 2021-12-19 NOTE — BH IP Treatment Plan (Signed)
Interdisciplinary Treatment and Diagnostic Plan Update  12/19/2021 Time of Session: 10:00 AM Megan Duncan MRN: [id number]  Principal Diagnosis: Altered mental status  Secondary Diagnoses: Principal Problem:   Altered mental status   Current Medications:  Current Facility-Administered Medications  Medication Dose Route Frequency Provider Last Rate Last Admin   acetaminophen (TYLENOL) tablet 650 mg  650 mg Oral Q6H PRN Deloria Lair, NP   650 mg at 12/18/21 2107   alum & mag hydroxide-simeth (MAALOX/MYLANTA) 200-200-20 MG/5ML suspension 30 mL  30 mL Oral Q4H PRN Deloria Lair, NP       donepezil (ARICEPT) tablet 5 mg  5 mg Oral QHS Parks Ranger, DO   5 mg at 12/18/21 2107   DULoxetine (CYMBALTA) DR capsule 60 mg  60 mg Oral Daily Anette Riedel M, NP   60 mg at 12/19/21 0904   haloperidol (HALDOL) tablet 1 mg  1 mg Oral [id number] Parks Ranger, DO   1 mg at 12/19/21 0865   levothyroxine (SYNTHROID) tablet 50 mcg  50 mcg Oral QAC breakfast Anette Riedel M, NP   50 mcg at 12/19/21 0510   lidocaine (LIDODERM) 5 % 1 patch  1 patch Transdermal Daily PRN Rulon Sera, MD   1 patch at 12/14/21 7846   LORazepam (ATIVAN) tablet 1 mg  1 mg Oral Q4H PRN Parks Ranger, DO   1 mg at 12/18/21 0104   magnesium hydroxide (MILK OF MAGNESIA) suspension 30 mL  30 mL Oral Daily PRN Deloria Lair, NP       meloxicam (MOBIC) tablet 7.5 mg  7.5 mg Oral Daily Parks Ranger, DO   7.5 mg at 12/19/21 9629   metFORMIN (GLUCOPHAGE) tablet 500 mg  500 mg Oral BID WC Parks Ranger, DO   500 mg at 12/19/21 0748   OLANZapine (ZYPREXA) tablet 5 mg  5 mg Oral Q4H PRN Parks Ranger, DO   5 mg at 12/16/21 2109   prazosin (MINIPRESS) capsule 2 mg  2 mg Oral QHS Parks Ranger, DO   2 mg at 12/18/21 2107   propranolol (INDERAL) tablet 20 mg  20 mg Oral BID WC Parks Ranger, DO   20 mg at 12/19/21 0750   QUEtiapine (SEROQUEL) tablet 100  mg  100 mg Oral QHS Parks Ranger, DO   100 mg at 12/18/21 2106   PTA Medications: Medications Prior to Admission  Medication Sig Dispense Refill Last Dose   albuterol (VENTOLIN HFA) 108 (90 Base) MCG/ACT inhaler Inhale 2 puffs into the lungs every 4 (four) hours as needed. For shortness of breath and wheezing 18 g 3    ALPRAZolam (XANAX) 0.5 MG tablet Take 30 min prior to procedure 10 tablet 0    amLODipine (NORVASC) 5 MG tablet TAKE 1 TABLET (5 MG TOTAL) BY MOUTH DAILY. 90 tablet 1    blood glucose meter kit and supplies Dispense based on patient and insurance preference. Use once a day.  DX Code: E11.9 1 each 0    cephALEXin (KEFLEX) 500 MG capsule Take 1 capsule (500 mg total) by mouth 2 (two) times daily. 14 capsule 0    clorazepate (TRANXENE) 3.75 MG tablet Take 1 tablet (3.75 mg total) by mouth 2 (two) times daily as needed for anxiety or sleep. 60 tablet 1    DULoxetine (CYMBALTA) 30 MG capsule Take 1 capsule (30 mg total) by mouth daily. For 2 weeks, then start 60 mg daily. 14 capsule 0  DULoxetine (CYMBALTA) 60 MG capsule TAKE 1 CAPSULE BY MOUTH EVERY DAY IN THE MORNING, after taking 30 mg for 2 weeks. 90 capsule 0    glucose blood test strip Use as instructed once a day.  Dx Code: E11.9 30 each 2    hydrocortisone-pramoxine (ANALPRAM HC) 2.5-1 % rectal cream Place 1 application rectally 3 (three) times daily. 30 g 3    Lancets MISC Use as directed once a day.  Dx code: E11.9 30 each 2    lithium carbonate 150 MG capsule TAKE 1 CAPSULE (150 MG TOTAL) BY MOUTH DAILY, TAKE WITH LITHIUM 600 MG TO EQUAL 750 MG TOTAL 90 capsule 0    lithium carbonate 300 MG capsule TAKE 2 CAPSULES (600 MG) BY MOUTH AT BEDTIME, WITH 150 MG CAPSULE 180 capsule 0    metFORMIN (GLUCOPHAGE) 1000 MG tablet Take 1 tablet (1,000 mg total) by mouth 2 (two) times daily with a meal. 180 tablet 1    ondansetron (ZOFRAN) 4 MG tablet Take 1 tablet (4 mg total) by mouth every 8 (eight) hours as needed for nausea  or vomiting. 15 tablet 0    QUEtiapine (SEROQUEL) 50 MG tablet Take 1 tablet (50 mg total) by mouth at bedtime. 30 tablet 2    traZODone (DESYREL) 50 MG tablet TAKE 1/2 TO 2 TABLETS BY MOUTH EVERY DAY AT BEDTIME AS NEEDED FOR SLEEP 180 tablet 0     Patient Stressors: Health problems    Patient Strengths: Ability for insight  General fund of knowledge  Supportive family/friends   Treatment Modalities: Medication Management, Group therapy, Case management,  1 to 1 session with clinician, Psychoeducation, Recreational therapy.   Physician Treatment Plan for Primary Diagnosis: Altered mental status Long Term Goal(s): Improvement in symptoms so as ready for discharge   Short Term Goals: Ability to identify changes in lifestyle to reduce recurrence of condition will improve Ability to verbalize feelings will improve Ability to disclose and discuss suicidal ideas Ability to demonstrate self-control will improve Ability to identify and develop effective coping behaviors will improve Ability to maintain clinical measurements within normal limits will improve Compliance with prescribed medications will improve Ability to identify triggers associated with substance abuse/mental health issues will improve  Medication Management: Evaluate patient's response, side effects, and tolerance of medication regimen.  Therapeutic Interventions: 1 to 1 sessions, Unit Group sessions and Medication administration.  Evaluation of Outcomes: Progressing  Physician Treatment Plan for Secondary Diagnosis: Principal Problem:   Altered mental status  Long Term Goal(s): Improvement in symptoms so as ready for discharge   Short Term Goals: Ability to identify changes in lifestyle to reduce recurrence of condition will improve Ability to verbalize feelings will improve Ability to disclose and discuss suicidal ideas Ability to demonstrate self-control will improve Ability to identify and develop effective  coping behaviors will improve Ability to maintain clinical measurements within normal limits will improve Compliance with prescribed medications will improve Ability to identify triggers associated with substance abuse/mental health issues will improve     Medication Management: Evaluate patient's response, side effects, and tolerance of medication regimen.  Therapeutic Interventions: 1 to 1 sessions, Unit Group sessions and Medication administration.  Evaluation of Outcomes: Progressing   RN Treatment Plan for Primary Diagnosis: Altered mental status Long Term Goal(s): Knowledge of disease and therapeutic regimen to maintain health will improve  Short Term Goals: Ability to remain free from injury will improve, Ability to verbalize frustration and anger appropriately will improve, Ability to demonstrate self-control, Ability  to participate in decision making will improve, Ability to verbalize feelings will improve, Ability to identify and develop effective coping behaviors will improve, and Compliance with prescribed medications will improve  Medication Management: RN will administer medications as ordered by provider, will assess and evaluate patient's response and provide education to patient for prescribed medication. RN will report any adverse and/or side effects to prescribing provider.  Therapeutic Interventions: 1 on 1 counseling sessions, Psychoeducation, Medication administration, Evaluate responses to treatment, Monitor vital signs and CBGs as ordered, Perform/monitor CIWA, COWS, AIMS and Fall Risk screenings as ordered, Perform wound care treatments as ordered.  Evaluation of Outcomes: Progressing   LCSW Treatment Plan for Primary Diagnosis: Altered mental status Long Term Goal(s): Safe transition to appropriate next level of care at discharge, Engage patient in therapeutic group addressing interpersonal concerns.  Short Term Goals: Engage patient in aftercare planning with  referrals and resources, Increase social support, Increase ability to appropriately verbalize feelings, Increase emotional regulation, Facilitate acceptance of mental health diagnosis and concerns, and Identify triggers associated with mental health/substance abuse issues  Therapeutic Interventions: Assess for all discharge needs, 1 to 1 time with Social worker, Explore available resources and support systems, Assess for adequacy in community support network, Educate family and significant other(s) on suicide prevention, Complete Psychosocial Assessment, Interpersonal group therapy.  Evaluation of Outcomes: Progressing   Progress in Treatment: Attending groups: Yes. Participating in groups: Yes. Taking medication as prescribed: Yes. Toleration medication: Yes. Family/Significant other contact made: Yes, individual(s) contacted:  pt's daughter Patient understands diagnosis: No. Discussing patient identified problems/goals with staff: No. Medical problems stabilized or resolved: Yes. Denies suicidal/homicidal ideation: Yes. Issues/concerns per patient self-inventory: No. Other: None  New problem(s) identified: No, Describe:  None  New Short Term/Long Term Goal(s): Patient to work towards elimination of symptoms of psychosis, medication management for mood stabilization; development of comprehensive mental wellness plan. Update 12/14/21: No changes at this time. Update 12/19/21: No changes at this time.      Patient Goals:  Patient was unable to participate in treatment team. Update 12/14/21: No changes at this time. Update 12/19/21: No changes at this time. Update 12/19/21: No changes at this time.        Discharge Plan or Barriers: CSW will assist pt in development of appropriate discharge/aftercare plan. Update 12/14/21: CSW was given permission to contact pt's daughter, Pincus Large. She stated that pt can return home. Pt will continue care with [facility] and recommendation for PT with  Homehealth or outpatient provider. CSW will coordinate discharge with daughter and Di Kindle, [place] [facility] [place]. Update 12/19/21: No changes at this time.      Reason for Continuation of Hospitalization:  Delusions  Depression Medication stabilization   Estimated Length of Stay: TBD    Scribe for Treatment Team:  A Martinique, LCSWA 12/19/2021 2:27 PM

## 2021-12-19 NOTE — Plan of Care (Signed)
Pt disoriented to situation. Pt cooperative and compliant with her meds. Prn tylenol given at her request for pain, with relief noted. Q15 mins safety checks maintained. Pt denies SI/HI/AVH. Will continue to monitor.  Problem: Education: Goal: Knowledge of [facility] Education information/materials will improve Outcome: Progressing Goal: Emotional status will improve Outcome: Progressing Goal: Mental status will improve Outcome: Progressing Goal: Verbalization of understanding the information provided will improve Outcome: Progressing   Problem: Activity: Goal: Interest or engagement in activities will improve Outcome: Progressing Goal: Sleeping patterns will improve Outcome: Progressing   Problem: Coping: Goal: Ability to verbalize frustrations and anger appropriately will improve Outcome: Progressing Goal: Ability to demonstrate self-control will improve Outcome: Progressing   Problem: Health Behavior/Discharge Planning: Goal: Identification of resources available to assist in meeting health care needs will improve Outcome: Progressing Goal: Compliance with treatment plan for underlying cause of condition will improve Outcome: Progressing   Problem: Physical Regulation: Goal: Ability to maintain clinical measurements within normal limits will improve Outcome: Progressing   Problem: Safety: Goal: Periods of time without injury will increase Outcome: Progressing   Problem: Education: Goal: Utilization of techniques to improve thought processes will improve Outcome: Progressing Goal: Knowledge of the prescribed therapeutic regimen will improve Outcome: Progressing   Problem: Activity: Goal: Interest or engagement in leisure activities will improve Outcome: Progressing Goal: Imbalance in normal sleep/wake cycle will improve Outcome: Progressing   Problem: Coping: Goal: Coping ability will improve Outcome: Progressing Goal: Will verbalize feelings Outcome:  Progressing   Problem: Health Behavior/Discharge Planning: Goal: Ability to make decisions will improve Outcome: Progressing Goal: Compliance with therapeutic regimen will improve Outcome: Progressing   Problem: Role Relationship: Goal: Will demonstrate positive changes in social behaviors and relationships Outcome: Progressing   Problem: Safety: Goal: Ability to disclose and discuss suicidal ideas will improve Outcome: Progressing Goal: Ability to identify and utilize support systems that promote safety will improve Outcome: Progressing   Problem: Self-Concept: Goal: Will verbalize positive feelings about self Outcome: Progressing Goal: Level of anxiety will decrease Outcome: Progressing

## 2021-12-19 NOTE — Progress Notes (Signed)
[facility] MD Progress Note  12/19/2021 11:28 AM Megan Duncan  MRN:  [id number] Subjective: Megan Duncan had been hallucinating at nighttime and sometimes having vivid dreams and sleepwalking so I lowered her propranolol and started her on Minipress at bedtime.  She states that she slept well and did not have any nightmares.  She is in a very good mood today.  She is tolerating the medications without any side effects.  There is no evidence of EPS or TD.  Her cognition is improving.  I spoke with the daughter and we are planning on discharge by Friday.  Daughter is very nervous and I reassured her.  Principal Problem: Altered mental status Diagnosis: Principal Problem:   Altered mental status  Total Time spent with patient: 15 minutes  Past Psychiatric History: Dr. Deborah Chalk Bipolar Disorder  Past Medical History:  Past Medical History:  Diagnosis Date   Acute pain of right shoulder 09/24/2018   Acute upper respiratory infection 09/19/2010   Allergic rhinitis 10/03/2016   Anterolisthesis of lumbar spine    Arthritis    hands   Bilateral hip pain 09/02/2014   Bipolar II disorder    Cellulitis and abscess of trunk 09/15/2009   Ceruminosis 03/04/2014   Cervicalgia 06/20/2007   Chronic low back pain 06/20/2007   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-sided sciatica 08/06/2012   COPD (chronic obstructive pulmonary disease) ([place])    Deformity of finger 07/15/2014   Degeneration of lumbar intervertebral disc 08/22/2020   Degenerative lumbar spinal stenosis    Dermatitis 72/07/4708   Diastolic dysfunction    Per pt, diagnosed after [facility]   Diverticulosis    DOE (dyspnea on exertion) 07/20/2016   Essential (primary) hypertension 04/23/2007   Estrogen deficiency 09/01/2017   External hemorrhoid 08/01/2015   Fibromyalgia    Foraminal stenosis of lumbar region 08/22/2020   Generalized anxiety disorder 09/01/2018   Hyperlipidemia    Inflammatory disease of uterus 04/23/2007   Left flank pain  07/13/2021   Left upper quadrant pain 09/01/2017   Lower abdominal pain 08/06/2012   Lumbar back pain with radiculopathy affecting left lower extremity 01/24/2010   Lumbar radiculopathy 01/26/2021   Lump in upper inner quadrant of left breast 07/13/2021   Major depressive disorder    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   Otitis media 09/05/2010   Palpitations 07/20/2016   Peripheral neuropathy 02/22/2010   Retrolisthesis of vertebrae 08/22/2020   Shingles outbreak 04/14/2015   Sinusitis, acute maxillary 09/14/2013   Stress incontinence, female 08/01/2015   Thoracic aortic atherosclerosis 05/14/2017   Tinea corporis 09/01/2017   Tinnitus 03/04/2014   Torticollis 03/04/2014   Type II diabetes mellitus 06/05/2018    Past Surgical History:  Procedure Laterality Date   APPENDECTOMY     EYE SURGERY     KNEE ARTHROSCOPY Right 11/12/2006   TONSILLECTOMY     TOTAL ABDOMINAL HYSTERECTOMY     Family History:  Family History  Problem Relation Age of Onset   Hypertension Mother    Hiatal hernia Mother    Diabetes Father    Hypertension Father    Heart attack Father    Alcoholism Father    Bipolar disorder Father    Mental illness Sister    Suicidality Sister    Colon cancer Maternal Aunt 80   Breast cancer Maternal Aunt    Bipolar disorder Paternal Aunt     Social History:  Social History   Substance and Sexual Activity  Alcohol Use Yes  Alcohol/week: 0.0 - 1.0 standard drinks   Comment: occasional wine     Social History   Substance and Sexual Activity  Drug Use No    Social History   Socioeconomic History   Marital status: Married    Spouse name: Katieann Hungate   Number of children: 3   Years of education: 14   Highest education level: Some college, no degree  Occupational History   Occupation: retired  Tobacco Use   Smoking status: Former    Packs/day: 1.50    Years: 40.00    Pack years: 60.00    Types: Cigarettes    Quit  date: 07/02/2004    Years since quitting: 17.4    Passive exposure: Past   Smokeless tobacco: Never   Tobacco comments:    Verified by Aldona Lento (Husband), & Lauralee Evener (Daughter)  Vaping Use   Vaping Use: Never used  Substance and Sexual Activity   Alcohol use: Yes    Alcohol/week: 0.0 - 1.0 standard drinks    Comment: occasional wine   Drug use: No   Sexual activity: Never  Other Topics Concern   Not on file  Social History Narrative   Lives with husband and grandson she is raising.   11/07/21 Lives with husband   Social Determinants of Health   Financial Resource Strain: Medium Risk   Difficulty of Paying Living Expenses: Somewhat hard  Food Insecurity: No Food Insecurity   Worried About Charity fundraiser in the Last Year: Never true   Ran Out of Food in the Last Year: Never true  Transportation Needs: No Transportation Needs   Lack of Transportation (Medical): No   Lack of Transportation (Non-Medical): No  Physical Activity: Inactive   Days of Exercise per Week: 0 days   Minutes of Exercise per Session: 0 min  Stress: No Stress Concern Present   Feeling of Stress : Not at all  Social Connections: Socially Integrated   Frequency of Communication with Friends and Family: More than three times a week   Frequency of Social Gatherings with Friends and Family: More than three times a week   Attends Religious Services: 1 to 4 times per year   Active Member of [employer] or Organizations: Yes   Attends Music therapist: More than 4 times per year   Marital Status: Married   Additional Social History:                         Sleep: Good  Appetite:  Good  Current Medications: Current Facility-Administered Medications  Medication Dose Route Frequency Provider Last Rate Last Admin   acetaminophen (TYLENOL) tablet 650 mg  650 mg Oral Q6H PRN Deloria Lair, NP   650 mg at 12/18/21 2107   alum & mag hydroxide-simeth (MAALOX/MYLANTA) 200-200-20  MG/5ML suspension 30 mL  30 mL Oral Q4H PRN Dixon, Rashaun M, NP       donepezil (ARICEPT) tablet 5 mg  5 mg Oral QHS Parks Ranger, DO   5 mg at 12/18/21 2107   DULoxetine (CYMBALTA) DR capsule 60 mg  60 mg Oral Daily Dixon, Rashaun M, NP   60 mg at 12/19/21 0904   haloperidol (HALDOL) tablet 1 mg  1 mg Oral [id number] Parks Ranger, DO   1 mg at 12/19/21 0748   levothyroxine (SYNTHROID) tablet 50 mcg  50 mcg Oral QAC breakfast Deloria Lair, NP   50 mcg at 12/19/21 0510  lidocaine (LIDODERM) 5 % 1 patch  1 patch Transdermal Daily PRN Rulon Sera, MD   1 patch at 12/14/21 1761   LORazepam (ATIVAN) tablet 1 mg  1 mg Oral Q4H PRN Parks Ranger, DO   1 mg at 12/18/21 0104   magnesium hydroxide (MILK OF MAGNESIA) suspension 30 mL  30 mL Oral Daily PRN Deloria Lair, NP       meloxicam (MOBIC) tablet 7.5 mg  7.5 mg Oral Daily Parks Ranger, DO   7.5 mg at 12/19/21 6073   metFORMIN (GLUCOPHAGE) tablet 500 mg  500 mg Oral BID WC Parks Ranger, DO   500 mg at 12/19/21 0748   OLANZapine (ZYPREXA) tablet 5 mg  5 mg Oral Q4H PRN Parks Ranger, DO   5 mg at 12/16/21 2109   prazosin (MINIPRESS) capsule 2 mg  2 mg Oral QHS Parks Ranger, DO   2 mg at 12/18/21 2107   propranolol (INDERAL) tablet 20 mg  20 mg Oral BID WC Parks Ranger, DO   20 mg at 12/19/21 0750   QUEtiapine (SEROQUEL) tablet 100 mg  100 mg Oral QHS Parks Ranger, DO   100 mg at 12/18/21 2106    Lab Results: No results found for this or any previous visit (from the past 48 hour(s)).  Blood Alcohol level:  Lab Results  Component Value Date   ETH <10 71/04/2693    Metabolic Disorder Labs: Lab Results  Component Value Date   HGBA1C 6.4 10/27/2021   No results found for: PROLACTIN Lab Results  Component Value Date   CHOL 186 10/19/2021   TRIG 140.0 10/19/2021   HDL 70.90 10/19/2021   CHOLHDL 3 10/19/2021   VLDL 28.0 10/19/2021   LDLCALC  87 10/19/2021   LDLCALC 99 09/22/2020    Physical Findings: AIMS:  , ,  ,  ,    CIWA:    COWS:     Musculoskeletal: Strength & Muscle Tone: within normal limits Gait & Station: normal Patient leans: N/A  Psychiatric Specialty Exam:  Presentation  General Appearance: Bizarre; Disheveled  Eye Contact:Minimal  Speech:Pressured  Speech Volume:Increased  Handedness:Right   Mood and Affect  Mood:Angry; Depressed; Dysphoric; Hopeless  Affect:Depressed; Tearful; Full Range   Thought Process  Thought Processes:Disorganized; Irrevelant  Descriptions of Associations:Loose  Orientation:Partial  Thought Content:Delusions; Illogical; Scattered; Paranoid Ideation  History of Schizophrenia/Schizoaffective disorder:No  Duration of Psychotic Symptoms:N/A  Hallucinations:No data recorded Ideas of Reference:Delusions; Paranoia  Suicidal Thoughts:No data recorded Homicidal Thoughts:No data recorded  Sensorium  Memory:Remote Poor  Judgment:Impaired  Insight:None   Executive Functions  Concentration:Poor  Attention Span:Poor  Recall:Poor  Fund of Knowledge:Poor  Language:Poor   Psychomotor Activity  Psychomotor Activity:No data recorded  Assets  Assets:Financial Resources/Insurance; Housing; Intimacy; Social Support   Sleep  Sleep:No data recorded   Physical Exam: Physical Exam Vitals and nursing note reviewed.  Constitutional:      Appearance: Normal appearance. She is normal weight.  Neurological:     General: No focal deficit present.     Mental Status: She is alert and oriented to person, place, and time.  Psychiatric:        Attention and Perception: Attention and perception normal.        Mood and Affect: Mood and affect normal.        Speech: Speech normal.        Behavior: Behavior normal. Behavior is cooperative.        Thought  Content: Thought content normal.        Cognition and Memory: Cognition normal. She exhibits impaired  recent memory.        Judgment: Judgment normal.   Review of Systems  Constitutional: Negative.   HENT: Negative.    Eyes: Negative.   Respiratory: Negative.    Cardiovascular: Negative.   Gastrointestinal: Negative.   Genitourinary: Negative.   Musculoskeletal: Negative.   Skin: Negative.   Neurological: Negative.   Endo/Heme/Allergies: Negative.   Psychiatric/Behavioral:  Positive for hallucinations and memory loss.   Blood pressure (!) 131/56, pulse 74, temperature 98.1 F (36.7 C), temperature source Oral, resp. rate 18, height 5\' 2"  (1.575 m), weight 86.2 kg, SpO2 98 %. Body mass index is 34.75 kg/m.   Treatment Plan Summary: Daily contact with patient to assess and evaluate symptoms and progress in treatment, Medication management, and Plan continue current medications.  Plan on discharge by Friday.  Parks Ranger, DO 12/19/2021, 11:28 AM

## 2021-12-20 DIAGNOSIS — F315 Bipolar disorder, current episode depressed, severe, with psychotic features: Secondary | ICD-10-CM | POA: Diagnosis not present

## 2021-12-20 DIAGNOSIS — R4182 Altered mental status, unspecified: Secondary | ICD-10-CM | POA: Diagnosis not present

## 2021-12-20 NOTE — BHH Counselor (Signed)
CSW talked with Megan Duncan,[phone], at [facility] to set up home health PT for pt.   She stated pt's insurance was in network and would set things up for pt in preparation for discharge Friday.   She stated she would talk with pt's daughter regarding completing setting up referral.   Rayleigh Gillyard [place], MSW, LCSW-A 2/8/20233:12 PM

## 2021-12-20 NOTE — Chronic Care Management (AMB) (Signed)
Chronic Care Management    Clinical Social Work Note  12/20/2021 Name: Megan Duncan MRN: [id number] DOB: 24-Sep-1946  Megan Duncan is a 76 y.o. year old female who is a primary care patient of Ann Held, DO. The CCM team was consulted to assist the patient with chronic disease management and/or care coordination needs related to: Appointment Scheduling Needs, [employer], and Mental Health Counseling and Resources.   Engaged with patient's husband and daughter by telephone for follow up visit in response to provider referral for social work chronic care management and care coordination services.   Consent to Services:  The patient was given information about Chronic Care Management services, agreed to services, and gave verbal consent prior to initiation of services.  Please see initial visit note for detailed documentation.   Patient agreed to services and consent obtained.   Assessment: Review of patient past medical history, allergies, medications, and health status, including review of relevant consultants reports was performed today as part of a comprehensive evaluation and provision of chronic care management and care coordination services.     SDOH (Social Determinants of Health) assessments and interventions performed:    Advanced Directives Status: Not addressed in this encounter.  CCM Care Plan  Allergies  Allergen Reactions   Atorvastatin Other (See Comments)    Significant rise in liver tests   Codeine Nausea And Vomiting   Diclofenac Other (See Comments)    Elevated LFT's   Doxycycline     Extreme nausea   Sulfonamide Derivatives Swelling    Facility-Administered Encounter Medications as of 12/19/2021  Medication   acetaminophen (TYLENOL) tablet 650 mg   alum & mag hydroxide-simeth (MAALOX/MYLANTA) 200-200-20 MG/5ML suspension 30 mL   donepezil (ARICEPT) tablet 5 mg   DULoxetine (CYMBALTA) DR capsule 60 mg   haloperidol (HALDOL) tablet  1 mg   levothyroxine (SYNTHROID) tablet 50 mcg   lidocaine (LIDODERM) 5 % 1 patch   LORazepam (ATIVAN) tablet 1 mg   magnesium hydroxide (MILK OF MAGNESIA) suspension 30 mL   meloxicam (MOBIC) tablet 7.5 mg   metFORMIN (GLUCOPHAGE) tablet 500 mg   OLANZapine (ZYPREXA) tablet 5 mg   prazosin (MINIPRESS) capsule 2 mg   propranolol (INDERAL) tablet 20 mg   QUEtiapine (SEROQUEL) tablet 100 mg   Outpatient Encounter Medications as of 12/19/2021  Medication Sig   albuterol (VENTOLIN HFA) 108 (90 Base) MCG/ACT inhaler Inhale 2 puffs into the lungs every 4 (four) hours as needed. For shortness of breath and wheezing   ALPRAZolam (XANAX) 0.5 MG tablet Take 30 min prior to procedure   amLODipine (NORVASC) 5 MG tablet TAKE 1 TABLET (5 MG TOTAL) BY MOUTH DAILY.   blood glucose meter kit and supplies Dispense based on patient and insurance preference. Use once a day.  DX Code: E11.9   cephALEXin (KEFLEX) 500 MG capsule Take 1 capsule (500 mg total) by mouth 2 (two) times daily.   clorazepate (TRANXENE) 3.75 MG tablet Take 1 tablet (3.75 mg total) by mouth 2 (two) times daily as needed for anxiety or sleep.   DULoxetine (CYMBALTA) 30 MG capsule Take 1 capsule (30 mg total) by mouth daily. For 2 weeks, then start 60 mg daily.   DULoxetine (CYMBALTA) 60 MG capsule TAKE 1 CAPSULE BY MOUTH EVERY DAY IN THE MORNING, after taking 30 mg for 2 weeks.   glucose blood test strip Use as instructed once a day.  Dx Code: E11.9   hydrocortisone-pramoxine (ANALPRAM HC) 2.5-1 % rectal cream  Place 1 application rectally 3 (three) times daily.   Lancets MISC Use as directed once a day.  Dx code: E11.9   levothyroxine (SYNTHROID) 50 MCG tablet TAKE 1 TABLET BY MOUTH EVERY DAY BEFORE BREAKFAST   lithium carbonate 150 MG capsule TAKE 1 CAPSULE (150 MG TOTAL) BY MOUTH DAILY, TAKE WITH LITHIUM 600 MG TO EQUAL 750 MG TOTAL   lithium carbonate 300 MG capsule TAKE 2 CAPSULES (600 MG) BY MOUTH AT BEDTIME, WITH 150 MG CAPSULE    metFORMIN (GLUCOPHAGE) 1000 MG tablet Take 1 tablet (1,000 mg total) by mouth 2 (two) times daily with a meal.   ondansetron (ZOFRAN) 4 MG tablet Take 1 tablet (4 mg total) by mouth every 8 (eight) hours as needed for nausea or vomiting.   propranolol (INDERAL) 20 MG tablet TAKE 1 TABLET BY MOUTH THREE TIMES A DAY   QUEtiapine (SEROQUEL) 50 MG tablet Take 1 tablet (50 mg total) by mouth at bedtime.   traZODone (DESYREL) 50 MG tablet TAKE 1/2 TO 2 TABLETS BY MOUTH EVERY DAY AT BEDTIME AS NEEDED FOR SLEEP    Patient Active Problem List   Diagnosis Date Noted   Altered mental status 12/03/2021   Anxiety 11/28/2021   Memory loss 10/26/2021   Bilateral hearing loss 10/26/2021   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   Degenerative lumbar spinal stenosis    Anterolisthesis of lumbar spine    Lump in upper inner quadrant of left breast 07/13/2021   Left flank pain 07/13/2021   Lumbar radiculopathy 01/26/2021   Body mass index (BMI) 36.0-36.9, adult 01/11/2021   Degeneration of lumbar intervertebral disc 08/22/2020   Retrolisthesis of vertebrae 08/22/2020   Foraminal stenosis of lumbar region 08/22/2020   Generalized anxiety disorder 09/01/2018   Type II diabetes mellitus 06/05/2018   Tinea corporis 09/01/2017   Left upper quadrant pain 09/01/2017   Estrogen deficiency 09/01/2017   Thoracic aortic atherosclerosis 05/14/2017   COPD (chronic obstructive pulmonary disease) 10/30/2016   Allergic rhinitis 10/03/2016   Palpitations 07/20/2016   DOE (dyspnea on exertion) 07/20/2016   External hemorrhoid 08/01/2015   Stress incontinence, female 08/01/2015   Dermatitis 06/02/2015   Shingles outbreak 04/14/2015   Hyperlipidemia associated with type 2 diabetes mellitus ([place])    Fibromyalgia 10/14/2014   Bilateral hip pain 09/02/2014   Deformity of finger 07/15/2014   Ceruminosis 03/04/2014   Tinnitus 03/04/2014   Torticollis 03/04/2014   Bipolar 2 disorder  12/03/2013   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-sided sciatica 08/06/2012   Lower abdominal pain 08/06/2012   Otitis media 09/05/2010   Peripheral neuropathy 02/22/2010   Lumbar back pain with radiculopathy affecting left lower extremity 01/24/2010   Cervicalgia 06/20/2007   Chronic low back pain 06/20/2007   Essential (primary) hypertension 04/23/2007   Inflammatory disease of uterus 04/23/2007    Conditions to be addressed/monitored: Anxiety, Depression, and Bipolar Disorder.  Mental Health Concerns, Limited Access to Caregiver, Cognitive Deficits, Memory Deficits, and Lacks Knowledge of [employer].  Care Plan : LCSW Plan of Care  Updates made by Francis Gaines, LCSW since 12/20/2021 12:00 AM     Problem: Improve Quality of Life through [facility].   Priority: High     Goal: Improve Quality of Life through [facility].   Start Date: 11/27/2021  Expected End Date: 01/25/2022  This Visit's Progress: On track  Recent Progress: On track  Priority: High  Note:   Current Barriers:   Patient with  Thoracic Aortic Atherosclerosis, Essential Hypertension, Chronic Obstructive Pulmonary Disease, Type II Diabetes Mellitus, Mild Neurocognitive Disorder, Bilateral Hearing Loss, Memory Loss, Fibromyalgia, Bipolar II Disorder, Generalized Anxiety Disorder, and Caregiver Stress needs Support, Education, Resources, Referrals, Advocacy, and Care Coordination to resolve unmet personal care needs in the home. Patient is unable to self-administer medications as prescribed, or consistently perform ADL's/IADL's independently. Lacks knowledge of available community agencies and resources. Clinical Goals:  Patient, husband, and daughter will work with LCSW, in an effort to coordinate in-home care services, through [employer].   Patient will demonstrate improved health management independence, as evidenced by having in-home care services in  place. Clinical Interventions: Collaboration with Primary Care Physician, Dr. Roma Schanz regarding development and update of comprehensive plan of care, as evidenced by provider attestation and co-signature. Inter-disciplinary care team collaboration (see longitudinal plan of care). Interventions performed:  Problem Solving/Task Centered, Psychoeducation/Health Education, Quality of Sleep Assessed and Sleep Hygiene Techniques Promoted, Caregiver Stress Acknowledged and Consideration of In-Home Care Services Encouraged. Patient Goals/Self-Care Activities:  Continue to receive inpatient psychiatric social work and case management services through Kiva Martinique, [place] with [facility] [place], until medically stable and psychiatrically cleared for discharge.   LCSW collaboration with Kiva Martinique, [place] with [place], to discuss goals of care, treatment plan, follow-up appointments, and discharge planning needs. Kiva Martinique, LCSWA with [facility], indicated that you have a tentative discharge scheduled for 12/22/2021, and will be returning home with a new psychotropic medication regimen, home health services, durable medical equipment, and a follow-up appointment with Lynnell Catalan, Psychiatrist with [facility]. Once discharged home, you will resume services with LCSW on a bi-weekly basis, along with husband, Wynne Rozak and daughter, Pincus Large, in an effort to coordinate hours of in-home care services through long-term care insurance policy.   Please contact LCSW directly (# [phone]) if you have questions, need assistance, or if additional social work needs are identified between now and our next scheduled telephone outreach call.  Follow-Up Plan:  12/26/2021 at 3:00 pm      Nat Christen Bradford Clinical Social  Worker [place] [phone]

## 2021-12-20 NOTE — Progress Notes (Signed)
Physical Therapy Treatment Patient Details Name: Megan Duncan MRN: [id number] DOB: May 30, 1946 Today's Date: 12/20/2021   History of Present Illness Pt is a 76 yo female that presented to the ED due to concerns of AMS, hallucinations, recent multiple ED visits. PMH of bipolar disorder, cervicalagia, chronic LBP, COPD, fibromyalgia, dementia.    PT Comments    Pt socializing in day room w/ other patients upon PT entrance for today's session. She is oriented to self and location, reports she had a "tough and emotional" conversation w/ her daughter last night about what happened to her prior to hospitalization, which is consistent w/ prior case report notes. She was able to ambulate ~251ft using RW and w/ SUPERVISION. Pt reported no c/o negative symptoms, but has continued to report some low back pain, but thinks it is "because these beds are like sleeping on a board". Pt will benefit from continued skilled PT in order to improve LE strength and mobility to restore PLOF. Current discharge recommendation remains appropriate due to the level of assistance required by the patient to ensure safety and improve overall function.   Recommendations for follow up therapy are one component of a multi-disciplinary discharge planning process, led by the attending physician.  Recommendations may be updated based on patient status, additional functional criteria and insurance authorization.  Follow Up Recommendations  Other (comment) (Pt requires 24/7 supervision at discharge, with follow up PT to maximize pt function, mobility, and safety)     Assistance Recommended at Discharge Frequent or constant Supervision/Assistance  Patient can return home with the following A little help with walking and/or transfers;A little help with bathing/dressing/bathroom;Assistance with cooking/housework;Direct supervision/assist for financial management;Assist for transportation;Direct supervision/assist for medications  management;Help with stairs or ramp for entrance   Equipment Recommendations  Rolling walker (2 wheels)    Recommendations for Other Services       Precautions / Restrictions Precautions Precautions: Fall Restrictions Weight Bearing Restrictions: No     Mobility  Bed Mobility                    Transfers Overall transfer level: Needs assistance Equipment used: Rolling walker (2 wheels) Transfers: Sit to/from Stand Sit to Stand: Supervision                Ambulation/Gait Ambulation/Gait assistance: Supervision Gait Distance (Feet): 240 Feet Assistive device: Rolling walker (2 wheels) Gait Pattern/deviations: Decreased step length - right, Decreased step length - left, Decreased stride length       General Gait Details: pt able to walk upright, with proper posture and RW positioning with cueing; improved tolerance noted today   Stairs             Wheelchair Mobility    Modified Rankin (Stroke Patients Only)       Balance Overall balance assessment: Needs assistance Sitting-balance support: Feet supported Sitting balance-Leahy Scale: Good     Standing balance support: Single extremity supported, During functional activity, Bilateral upper extremity supported Standing balance-Leahy Scale: Fair                              Cognition Arousal/Alertness: Awake/alert Behavior During Therapy: WFL for tasks assessed/performed Overall Cognitive Status: No family/caregiver present to determine baseline cognitive functioning                                 General  Comments: oriented to self, location        Exercises      General Comments        Pertinent Vitals/Pain Pain Assessment Pain Assessment: No/denies pain    Home Living                          Prior Function            PT Goals (current goals can now be found in the care plan section) Progress towards PT goals: Progressing toward  goals    Frequency    Min 2X/week      PT Plan Current plan remains appropriate    Co-evaluation              AM-PAC PT "6 Clicks" Mobility   Outcome Measure  Help needed turning from your back to your side while in a flat bed without using bedrails?: None Help needed moving from lying on your back to sitting on the side of a flat bed without using bedrails?: None Help needed moving to and from a bed to a chair (including a wheelchair)?: None Help needed standing up from a chair using your arms (e.g., wheelchair or bedside chair)?: None Help needed to walk in hospital room?: A Little Help needed climbing 3-5 steps with a railing? : A Little 6 Click Score: 22    End of Session Equipment Utilized During Treatment: Gait belt Activity Tolerance: Patient tolerated treatment well Patient left: in chair (in day room chair, w/ nursing supervising.) Nurse Communication: Mobility status PT Visit Diagnosis: Other abnormalities of gait and mobility (R26.89);Difficulty in walking, not elsewhere classified (R26.2)     Time: 1779-3903 PT Time Calculation (min) (ACUTE ONLY): 10 min  Charges:                         Jonnie Kind, SPT 12/20/2021, 11:17 AM

## 2021-12-20 NOTE — Progress Notes (Signed)
Patient is calm,pleasant and cooperative with assessment. Patient denies SI, HI, and AVH. Patient also denies depression and anxiety. She complains of not sleeping too well at some point last night. Scheduled medications administered as ordered. Patient compliant with all medications. Patient is active on the unit and ate breakfast and watched TV in the dayroom. Patient is observed interacting appropriately with staff and other patients on the unit. Q 15 minutes Safety checks maintained. Will continue to monitor.

## 2021-12-20 NOTE — Group Note (Signed)
[facility] LCSW Group Therapy Note    Group Date: 12/20/2021 Start Time: 1405 End Time: 1500  Type of Therapy and Topic:  Group Therapy:  Overcoming Obstacles  Participation Level:  [facility] PARTICIPATION LEVEL: Did Not Attend    Description of Group:   In this group patients will be encouraged to explore what they see as obstacles to their own wellness and recovery. They will be guided to discuss their thoughts, feelings, and behaviors related to these obstacles. The group will process together ways to cope with barriers, with attention given to specific choices patients can make. Each patient will be challenged to identify changes they are motivated to make in order to overcome their obstacles. This group will be process-oriented, with patients participating in exploration of their own experiences as well as giving and receiving support and challenge from other group members.  Therapeutic Goals: 1. Patient will identify personal and current obstacles as they relate to admission. 2. Patient will identify barriers that currently interfere with their wellness or overcoming obstacles.  3. Patient will identify feelings, thought process and behaviors related to these barriers. 4. Patient will identify two changes they are willing to make to overcome these obstacles:    Summary of Patient Progress   X   Therapeutic Modalities:   Cognitive Behavioral Therapy Solution Focused Therapy Motivational Interviewing Relapse Prevention Therapy   Maylee Bare A [place], LCSWA

## 2021-12-20 NOTE — Progress Notes (Signed)
Pt lying in bed with eyes open; calm, cooperative. Pt states "I got my feelings hurt today." She says that she feels like she was pressuring her daughter to tell her how she was when she was first admitted onto the unit because she cannot remember, but her daughter will not tell her. This nurse actively listened to pt while she expressed how she feels. Pt denies pain and SI/HI/AVH at this time. Pt reports trouble falling asleep, but states "I'm getting there" and describes her appetite as "wonderful". Pt states that she feels anxious "in my stomach" and rates anxiety 5/10; however she denies depression at this time. Pt requested to call her daughter because her daughter was upset when she left after visiting her; pt's request granted. No acute distress noted.

## 2021-12-20 NOTE — Progress Notes (Signed)
[facility] MD Progress Note  12/20/2021 11:22 AM Megan Duncan  MRN:  [id number] Subjective: Megan Duncan is doing well.  She is less confused and is oriented x3 for the most part.  She is in a good mood.  She met with her daughter last night.  She is taking her medications as prescribed and denies any side effects.  She tells me that her daughter is setting up some home health to prepare for her to be discharged on Friday.  There is no evidence of EPS or TD.  She slept well last night with some trouble falling asleep, and did have some dreams but not nightmares and no sleepwalking.  Principal Problem: Altered mental status Diagnosis: Principal Problem:   Altered mental status  Total Time spent with patient: 15 minutes  Past Psychiatric History:Yes  Past Medical History:  Past Medical History:  Diagnosis Date   Acute pain of right shoulder 09/24/2018   Acute upper respiratory infection 09/19/2010   Allergic rhinitis 10/03/2016   Anterolisthesis of lumbar spine    Arthritis    hands   Bilateral hip pain 09/02/2014   Bipolar II disorder    Cellulitis and abscess of trunk 09/15/2009   Ceruminosis 03/04/2014   Cervicalgia 06/20/2007   Chronic low back pain 06/20/2007   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-sided sciatica 08/06/2012   COPD (chronic obstructive pulmonary disease) ([place])    Deformity of finger 07/15/2014   Degeneration of lumbar intervertebral disc 08/22/2020   Degenerative lumbar spinal stenosis    Dermatitis 26/83/4196   Diastolic dysfunction    Per pt, diagnosed after [facility]   Diverticulosis    DOE (dyspnea on exertion) 07/20/2016   Essential (primary) hypertension 04/23/2007   Estrogen deficiency 09/01/2017   External hemorrhoid 08/01/2015   Fibromyalgia    Foraminal stenosis of lumbar region 08/22/2020   Generalized anxiety disorder 09/01/2018   Hyperlipidemia    Inflammatory disease of uterus 04/23/2007   Left flank pain 07/13/2021   Left upper quadrant pain  09/01/2017   Lower abdominal pain 08/06/2012   Lumbar back pain with radiculopathy affecting left lower extremity 01/24/2010   Lumbar radiculopathy 01/26/2021   Lump in upper inner quadrant of left breast 07/13/2021   Major depressive disorder    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   Otitis media 09/05/2010   Palpitations 07/20/2016   Peripheral neuropathy 02/22/2010   Retrolisthesis of vertebrae 08/22/2020   Shingles outbreak 04/14/2015   Sinusitis, acute maxillary 09/14/2013   Stress incontinence, female 08/01/2015   Thoracic aortic atherosclerosis 05/14/2017   Tinea corporis 09/01/2017   Tinnitus 03/04/2014   Torticollis 03/04/2014   Type II diabetes mellitus 06/05/2018    Past Surgical History:  Procedure Laterality Date   APPENDECTOMY     EYE SURGERY     KNEE ARTHROSCOPY Right 11/12/2006   TONSILLECTOMY     TOTAL ABDOMINAL HYSTERECTOMY     Family History:  Family History  Problem Relation Age of Onset   Hypertension Mother    Hiatal hernia Mother    Diabetes Father    Hypertension Father    Heart attack Father    Alcoholism Father    Bipolar disorder Father    Mental illness Sister    Suicidality Sister    Colon cancer Maternal Aunt 80   Breast cancer Maternal Aunt    Bipolar disorder Paternal Aunt    Social History:  Social History   Substance and Sexual Activity  Alcohol Use Yes  Alcohol/week: 0.0 - 1.0 standard drinks   Comment: occasional wine     Social History   Substance and Sexual Activity  Drug Use No    Social History   Socioeconomic History   Marital status: Married    Spouse name: Shaneta Cervenka   Number of children: 3   Years of education: 14   Highest education level: Some college, no degree  Occupational History   Occupation: retired  Tobacco Use   Smoking status: Former    Packs/day: 1.50    Years: 40.00    Pack years: 60.00    Types: Cigarettes    Quit date: 07/02/2004    Years since  quitting: 17.4    Passive exposure: Past   Smokeless tobacco: Never   Tobacco comments:    Verified by Aldona Lento (Husband), & Lauralee Evener (Daughter)  Vaping Use   Vaping Use: Never used  Substance and Sexual Activity   Alcohol use: Yes    Alcohol/week: 0.0 - 1.0 standard drinks    Comment: occasional wine   Drug use: No   Sexual activity: Never  Other Topics Concern   Not on file  Social History Narrative   Lives with husband and grandson she is raising.   11/07/21 Lives with husband   Social Determinants of Health   Financial Resource Strain: Medium Risk   Difficulty of Paying Living Expenses: Somewhat hard  Food Insecurity: No Food Insecurity   Worried About Charity fundraiser in the Last Year: Never true   Ran Out of Food in the Last Year: Never true  Transportation Needs: No Transportation Needs   Lack of Transportation (Medical): No   Lack of Transportation (Non-Medical): No  Physical Activity: Inactive   Days of Exercise per Week: 0 days   Minutes of Exercise per Session: 0 min  Stress: No Stress Concern Present   Feeling of Stress : Not at all  Social Connections: Socially Integrated   Frequency of Communication with Friends and Family: More than three times a week   Frequency of Social Gatherings with Friends and Family: More than three times a week   Attends Religious Services: 1 to 4 times per year   Active Member of [employer] or Organizations: Yes   Attends Music therapist: More than 4 times per year   Marital Status: Married   Additional Social History:                         Sleep: Fair  Appetite:  Good  Current Medications: Current Facility-Administered Medications  Medication Dose Route Frequency Provider Last Rate Last Admin   acetaminophen (TYLENOL) tablet 650 mg  650 mg Oral Q6H PRN Deloria Lair, NP   650 mg at 12/18/21 2107   alum & mag hydroxide-simeth (MAALOX/MYLANTA) 200-200-20 MG/5ML suspension 30 mL  30 mL  Oral Q4H PRN Dixon, Rashaun M, NP       donepezil (ARICEPT) tablet 5 mg  5 mg Oral QHS Parks Ranger, DO   5 mg at 12/19/21 2059   DULoxetine (CYMBALTA) DR capsule 60 mg  60 mg Oral Daily Dixon, Rashaun M, NP   60 mg at 12/20/21 0911   haloperidol (HALDOL) tablet 1 mg  1 mg Oral [id number] Parks Ranger, DO   1 mg at 12/20/21 0911   levothyroxine (SYNTHROID) tablet 50 mcg  50 mcg Oral QAC breakfast Deloria Lair, NP   50 mcg at 12/19/21 0510  lidocaine (LIDODERM) 5 % 1 patch  1 patch Transdermal Daily PRN Rulon Sera, MD   1 patch at 12/14/21 7412   LORazepam (ATIVAN) tablet 1 mg  1 mg Oral Q4H PRN Parks Ranger, DO   1 mg at 12/19/21 2208   magnesium hydroxide (MILK OF MAGNESIA) suspension 30 mL  30 mL Oral Daily PRN Deloria Lair, NP       meloxicam (MOBIC) tablet 7.5 mg  7.5 mg Oral Daily Parks Ranger, DO   7.5 mg at 12/20/21 8786   metFORMIN (GLUCOPHAGE) tablet 500 mg  500 mg Oral BID WC Parks Ranger, DO   500 mg at 12/20/21 0911   OLANZapine (ZYPREXA) tablet 5 mg  5 mg Oral Q4H PRN Parks Ranger, DO   5 mg at 12/16/21 2109   prazosin (MINIPRESS) capsule 2 mg  2 mg Oral QHS Parks Ranger, DO   2 mg at 12/19/21 2059   propranolol (INDERAL) tablet 20 mg  20 mg Oral BID WC Parks Ranger, DO   20 mg at 12/20/21 0911   QUEtiapine (SEROQUEL) tablet 100 mg  100 mg Oral QHS Parks Ranger, DO   100 mg at 12/19/21 2059    Lab Results: No results found for this or any previous visit (from the past 48 hour(s)).  Blood Alcohol level:  Lab Results  Component Value Date   ETH <10 76/72/0947    Metabolic Disorder Labs: Lab Results  Component Value Date   HGBA1C 6.4 10/27/2021   No results found for: PROLACTIN Lab Results  Component Value Date   CHOL 186 10/19/2021   TRIG 140.0 10/19/2021   HDL 70.90 10/19/2021   CHOLHDL 3 10/19/2021   VLDL 28.0 10/19/2021   LDLCALC 87 10/19/2021   LDLCALC 99  09/22/2020    Physical Findings: AIMS:  , ,  ,  ,    CIWA:    COWS:     Musculoskeletal: Strength & Muscle Tone: within normal limits Gait & Station: normal Patient leans: N/A  Psychiatric Specialty Exam:  Presentation  General Appearance: Bizarre; Disheveled  Eye Contact:Minimal  Speech:Pressured  Speech Volume:Increased  Handedness:Right   Mood and Affect  Mood:Angry; Depressed; Dysphoric; Hopeless  Affect:Depressed; Tearful; Full Range   Thought Process  Thought Processes:Disorganized; Irrevelant  Descriptions of Associations:Loose  Orientation:Partial  Thought Content:Delusions; Illogical; Scattered; Paranoid Ideation  History of Schizophrenia/Schizoaffective disorder:No  Duration of Psychotic Symptoms:N/A  Hallucinations:No data recorded Ideas of Reference:Delusions; Paranoia  Suicidal Thoughts:No data recorded Homicidal Thoughts:No data recorded  Sensorium  Memory:Remote Poor  Judgment:Impaired  Insight:None   Executive Functions  Concentration:Poor  Attention Span:Poor  Recall:Poor  Fund of Knowledge:Poor  Language:Poor   Psychomotor Activity  Psychomotor Activity:No data recorded  Assets  Assets:Financial Resources/Insurance; Housing; Intimacy; Social Support   Sleep  Sleep:No data recorded   Physical Exam: Physical Exam Vitals and nursing note reviewed.  Constitutional:      Appearance: Normal appearance. She is normal weight.  Neurological:     General: No focal deficit present.     Mental Status: She is alert and oriented to person, place, and time.  Psychiatric:        Attention and Perception: Attention and perception normal.        Mood and Affect: Mood and affect normal.        Speech: Speech normal.        Behavior: Behavior normal. Behavior is cooperative.        Thought  Content: Thought content normal.        Cognition and Memory: Cognition normal. Memory is impaired.        Judgment: Judgment normal.    Review of Systems  Constitutional: Negative.   HENT: Negative.    Eyes: Negative.   Respiratory: Negative.    Cardiovascular: Negative.   Gastrointestinal: Negative.   Genitourinary: Negative.   Musculoskeletal: Negative.   Skin: Negative.   Neurological: Negative.   Endo/Heme/Allergies: Negative.   Psychiatric/Behavioral:  Positive for memory loss.   Blood pressure 127/62, pulse 81, temperature 98 F (36.7 C), temperature source Oral, resp. rate 18, height '5\' 2"'  (1.575 m), weight 86.2 kg, SpO2 97 %. Body mass index is 34.75 kg/m.   Treatment Plan Summary: Daily contact with patient to assess and evaluate symptoms and progress in treatment, Medication management, and Plan continue current medications.  [place], DO 12/20/2021, 11:22 AM

## 2021-12-20 NOTE — Patient Instructions (Signed)
Visit Information  Thank you for taking time to visit with me today. Please don't hesitate to contact me if I can be of assistance to you before our next scheduled telephone appointment.  Following are the goals we discussed today:  Patient Goals/Self-Care Activities:  Continue to receive inpatient psychiatric social work and case management services through [facility] [place], [place] with [facility], until medically stable and psychiatrically cleared for discharge.   LCSW collaboration with [facility] [place], [place] with [place], to discuss goals of care, treatment plan, follow-up appointments, and discharge planning needs. [facility] [place], LCSWA with [facility], indicated that you have a tentative discharge scheduled for 12/22/2021, and will be returning home with a new psychotropic medication regimen, home health services, durable medical equipment, and a follow-up appointment with Lynnell Catalan, Psychiatrist with [facility]. Once discharged home, you will resume services with LCSW on a bi-weekly basis, along with husband, Niveah Boerner and daughter, Pincus Large, in an effort to coordinate hours of in-home care services through long-term care insurance policy.   Please contact LCSW directly (# [phone]) if you have questions, need assistance, or if additional social work needs are identified between now and our next scheduled telephone outreach call.  Follow-Up Plan:  12/26/2021 at 3:00 pm  Please call the care guide team at [phone] if you need to cancel or reschedule your appointment.   If you are experiencing a Mental Health or [place] or need someone to talk to, please call the Suicide and Crisis Lifeline: 988 call the [place] National Suicide Prevention Lifeline: 312-762-3486 or TTY: 256-221-2636 TTY  989-197-6869) to talk to a trained counselor call 1-800-273-TALK (toll free, 24 hour hotline) go to [facility] [address] [place] [phone]) call the [place]: [phone] call 911   Patient verbalizes understanding of instructions and care plan provided today and agrees to view in [place]. Active MyChart status confirmed with patient.    [place] Licensed Clinical Social Worker [facility] [phone]

## 2021-12-20 NOTE — [facility] Counselor (Signed)
CSW contacted [facility] to set up follow up appointment. CSW left voicemail with contact information to follow up with CSW. CSW will contact at another time.   Ramesha Poster [place], MSW, LCSW-A 2/8/20234:23 PM

## 2021-12-21 DIAGNOSIS — R4182 Altered mental status, unspecified: Secondary | ICD-10-CM | POA: Diagnosis not present

## 2021-12-21 DIAGNOSIS — F315 Bipolar disorder, current episode depressed, severe, with psychotic features: Secondary | ICD-10-CM | POA: Diagnosis not present

## 2021-12-21 MED ORDER — TRAZODONE HCL 50 MG PO TABS: 100.0000 mg | ORAL_TABLET | Freq: Every evening | ORAL | Status: DC | PRN

## 2021-12-21 MED ORDER — METFORMIN HCL 500 MG PO TABS
1000.0000 mg | ORAL_TABLET | Freq: Two times a day (BID) | ORAL | Status: DC
  Administered 2021-12-21 – 2021-12-22 (×2): 1000 mg via ORAL
  Filled 2021-12-21 (×2): qty 2

## 2021-12-21 MED ORDER — LORAZEPAM 1 MG PO TABS
1.0000 mg | ORAL_TABLET | Freq: Every day | ORAL | Status: DC
  Administered 2021-12-21: 1 mg via ORAL
  Filled 2021-12-21: qty 1

## 2021-12-21 MED ORDER — QUETIAPINE FUMARATE 100 MG PO TABS
200.0000 mg | ORAL_TABLET | Freq: Every day | ORAL | Status: DC
  Administered 2021-12-21: 200 mg via ORAL
  Filled 2021-12-21: qty 2

## 2021-12-21 NOTE — Progress Notes (Signed)
Pt lying in bed with eyes open; calm and cooperative. Pt states "I am so much better since we talked last night." Pt denies pain and SI/HI/AVH at this time. Pt reports that she did not get to sleep until after 3 am last night. Pt describes her appetite as "good, excellent, too good". Pt denies anxiety and depression at this time, stating "I think I'm pretty good. I feel good. I feel closest to being me." No acute distress noted.

## 2021-12-21 NOTE — Progress Notes (Signed)
Physical Therapy Treatment Patient Details Name: Megan Duncan MRN: [id number] DOB: 10-13-1946 Today's Date: 12/21/2021   History of Present Illness Pt is a 76 yo female that presented to the ED due to concerns of AMS, hallucinations, recent multiple ED visits. PMH of bipolar disorder, cervicalagia, chronic LBP, COPD, fibromyalgia, dementia.    PT Comments    Pt able to walk independently on unit with RW and no LOB or safety concerns.  Stated she has been walking on her own on unit with no trouble. Stated she is very sleepy today and wants to nap.  Self initiated return to bed.     Recommendations for follow up therapy are one component of a multi-disciplinary discharge planning process, led by the attending physician.  Recommendations may be updated based on patient status, additional functional criteria and insurance authorization.  Follow Up Recommendations  Other (comment) (Pt requires 24/7 supervision at discharge, with follow up PT to maximize pt function, mobility, and safety)     Assistance Recommended at Discharge Frequent or constant Supervision/Assistance  Patient can return home with the following A little help with walking and/or transfers;A little help with bathing/dressing/bathroom;Assistance with cooking/housework;Direct supervision/assist for financial management;Assist for transportation;Direct supervision/assist for medications management;Help with stairs or ramp for entrance   Equipment Recommendations  Rolling walker (2 wheels)    Recommendations for Other Services       Precautions / Restrictions Precautions Precautions: Fall Restrictions Weight Bearing Restrictions: No     Mobility  Bed Mobility Overal bed mobility: Modified Independent         Sit to supine: Modified independent (Device/Increase time)        Transfers Overall transfer level: Needs assistance Equipment used: Rolling walker (2 wheels) Transfers: Sit to/from Stand Sit to  Stand: Supervision                Ambulation/Gait Ambulation/Gait assistance: Supervision Gait Distance (Feet): 150 Feet Assistive device: Rolling walker (2 wheels) Gait Pattern/deviations: Decreased step length - right, Decreased step length - left, Decreased stride length       General Gait Details: gait with good safety and no LOB.  stated she walks without assist on unit but does use RW   Stairs             Wheelchair Mobility    Modified Rankin (Stroke Patients Only)       Balance Overall balance assessment: Needs assistance Sitting-balance support: Feet supported Sitting balance-Leahy Scale: Good     Standing balance support: During functional activity, Bilateral upper extremity supported Standing balance-Leahy Scale: Fair                              Cognition Arousal/Alertness: Awake/alert Behavior During Therapy: WFL for tasks assessed/performed Overall Cognitive Status: No family/caregiver present to determine baseline cognitive functioning                                          Exercises      General Comments        Pertinent Vitals/Pain Pain Assessment Pain Assessment: Faces Faces Pain Scale: Hurts little more Pain Location: back Pain Descriptors / Indicators: Sore Pain Intervention(s): Limited activity within patient's tolerance, Monitored during session    Home Living  Prior Function            PT Goals (current goals can now be found in the care plan section) Progress towards PT goals: Progressing toward goals    Frequency    Min 2X/week      PT Plan Current plan remains appropriate    Co-evaluation              AM-PAC PT "6 Clicks" Mobility   Outcome Measure  Help needed turning from your back to your side while in a flat bed without using bedrails?: None Help needed moving from lying on your back to sitting on the side of a flat bed  without using bedrails?: None Help needed moving to and from a bed to a chair (including a wheelchair)?: None Help needed standing up from a chair using your arms (e.g., wheelchair or bedside chair)?: None Help needed to walk in hospital room?: A Little Help needed climbing 3-5 steps with a railing? : A Little 6 Click Score: 22    End of Session Equipment Utilized During Treatment: Gait belt Activity Tolerance: Patient tolerated treatment well Patient left: in chair (in day room chair, w/ nursing supervising.) Nurse Communication: Mobility status PT Visit Diagnosis: Other abnormalities of gait and mobility (R26.89);Difficulty in walking, not elsewhere classified (R26.2)     Time: 4643-1427 PT Time Calculation (min) (ACUTE ONLY): 15 min  Charges:  $Gait Training: 8-22 mins                    Chesley Noon, PTA 12/21/21, 12:23 PM

## 2021-12-21 NOTE — Progress Notes (Signed)
Pt lying in bed with eyes open; calm, cooperative. Pt states "I'm doing alright, I guess. I can't get to sleep because I can't wind down." Pt c/o low back pain although she has pain patch applied; she rates her pain 6 on 0-10 pain scale. She denies SI/HI/AVH at this time. She describes her sleep as "good" and says "I have eaten everything they have put in front of me" when asked about her appetite. She currently denies anxiety and depression and states "I feel light. I feel like smiling"; pt smiling. No acute distress noted.

## 2021-12-21 NOTE — Progress Notes (Signed)
Pt stated that she is unable to go back to sleep and requested medication. She reported hearing a dog bark and the patient across the hall "playing music in her room". No acute distress noted.

## 2021-12-21 NOTE — Progress Notes (Signed)
[facility] MD Progress Note  12/21/2021 10:23 AM [name]  MRN:  [id number] Subjective: Megan Duncan continues to do well except for at nighttime.  She was up late last night and states that she was dreaming.  The nurses think she was hallucinating.  Other than that, her cognition is improved.  She is alert and oriented x3 pleasant and cooperative.  She is taking her medications as prescribed and denies any side effects.  There is no evidence of EPS or TD.  She is probably sundowning at night.  Then again she states that she is always had really bad dreams.  The [employer] worked her first night so we will make some more changes.  They give her Ativan when she wakes up and she does go back to sleep Miller schedule it.  Principal Problem: Altered mental status Diagnosis: Principal Problem:   Altered mental status  Total Time spent with patient: 15 minutes  Past Psychiatric History: Dr. Deborah Chalk  Past Medical History:  Past Medical History:  Diagnosis Date   Acute pain of right shoulder 09/24/2018   Acute upper respiratory infection 09/19/2010   Allergic rhinitis 10/03/2016   Anterolisthesis of lumbar spine    Arthritis    hands   Bilateral hip pain 09/02/2014   Bipolar II disorder    Cellulitis and abscess of trunk 09/15/2009   Ceruminosis 03/04/2014   Cervicalgia 06/20/2007   Chronic low back pain 06/20/2007   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-sided sciatica 08/06/2012   COPD (chronic obstructive pulmonary disease) ([place])    Deformity of finger 07/15/2014   Degeneration of lumbar intervertebral disc 08/22/2020   Degenerative lumbar spinal stenosis    Dermatitis 40/98/1191   Diastolic dysfunction    Per pt, diagnosed after [facility]   Diverticulosis    DOE (dyspnea on exertion) 07/20/2016   Essential (primary) hypertension 04/23/2007   Estrogen deficiency 09/01/2017   External hemorrhoid 08/01/2015   Fibromyalgia    Foraminal stenosis of lumbar region 08/22/2020   Generalized  anxiety disorder 09/01/2018   Hyperlipidemia    Inflammatory disease of uterus 04/23/2007   Left flank pain 07/13/2021   Left upper quadrant pain 09/01/2017   Lower abdominal pain 08/06/2012   Lumbar back pain with radiculopathy affecting left lower extremity 01/24/2010   Lumbar radiculopathy 01/26/2021   Lump in upper inner quadrant of left breast 07/13/2021   Major depressive disorder    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   Otitis media 09/05/2010   Palpitations 07/20/2016   Peripheral neuropathy 02/22/2010   Retrolisthesis of vertebrae 08/22/2020   Shingles outbreak 04/14/2015   Sinusitis, acute maxillary 09/14/2013   Stress incontinence, female 08/01/2015   Thoracic aortic atherosclerosis 05/14/2017   Tinea corporis 09/01/2017   Tinnitus 03/04/2014   Torticollis 03/04/2014   Type II diabetes mellitus 06/05/2018    Past Surgical History:  Procedure Laterality Date   APPENDECTOMY     EYE SURGERY     KNEE ARTHROSCOPY Right 11/12/2006   TONSILLECTOMY     TOTAL ABDOMINAL HYSTERECTOMY     Family History:  Family History  Problem Relation Age of Onset   Hypertension Mother    Hiatal hernia Mother    Diabetes Father    Hypertension Father    Heart attack Father    Alcoholism Father    Bipolar disorder Father    Mental illness Sister    Suicidality Sister    Colon cancer Maternal Aunt 36   Breast cancer Maternal Aunt  Bipolar disorder Paternal Aunt    Family Psychiatric  History: Sister committed suicide Social History:  Social History   Substance and Sexual Activity  Alcohol Use Yes   Alcohol/week: 0.0 - 1.0 standard drinks   Comment: occasional wine     Social History   Substance and Sexual Activity  Drug Use No    Social History   Socioeconomic History   Marital status: Married    Spouse name: Alencia Gordon   Number of children: 3   Years of education: 14   Highest education level: Some college, no degree   Occupational History   Occupation: retired  Tobacco Use   Smoking status: Former    Packs/day: 1.50    Years: 40.00    Pack years: 60.00    Types: Cigarettes    Quit date: 07/02/2004    Years since quitting: 17.4    Passive exposure: Past   Smokeless tobacco: Never   Tobacco comments:    Verified by Aldona Lento (Husband), & Lauralee Evener (Daughter)  Vaping Use   Vaping Use: Never used  Substance and Sexual Activity   Alcohol use: Yes    Alcohol/week: 0.0 - 1.0 standard drinks    Comment: occasional wine   Drug use: No   Sexual activity: Never  Other Topics Concern   Not on file  Social History Narrative   Lives with husband and grandson she is raising.   11/07/21 Lives with husband   Social Determinants of Health   Financial Resource Strain: Medium Risk   Difficulty of Paying Living Expenses: Somewhat hard  Food Insecurity: No Food Insecurity   Worried About Charity fundraiser in the Last Year: Never true   Ran Out of Food in the Last Year: Never true  Transportation Needs: No Transportation Needs   Lack of Transportation (Medical): No   Lack of Transportation (Non-Medical): No  Physical Activity: Inactive   Days of Exercise per Week: 0 days   Minutes of Exercise per Session: 0 min  Stress: No Stress Concern Present   Feeling of Stress : Not at all  Social Connections: Socially Integrated   Frequency of Communication with Friends and Family: More than three times a week   Frequency of Social Gatherings with Friends and Family: More than three times a week   Attends Religious Services: 1 to 4 times per year   Active Member of [employer] or Organizations: Yes   Attends Music therapist: More than 4 times per year   Marital Status: Married   Additional Social History:                         Sleep: Poor  Appetite:  Good  Current Medications: Current Facility-Administered Medications  Medication Dose Route Frequency Provider Last Rate Last  Admin   acetaminophen (TYLENOL) tablet 650 mg  650 mg Oral Q6H PRN Deloria Lair, NP   650 mg at 12/21/21 1022   alum & mag hydroxide-simeth (MAALOX/MYLANTA) 200-200-20 MG/5ML suspension 30 mL  30 mL Oral Q4H PRN Dixon, Rashaun M, NP       donepezil (ARICEPT) tablet 5 mg  5 mg Oral QHS Parks Ranger, DO   5 mg at 12/20/21 2118   DULoxetine (CYMBALTA) DR capsule 60 mg  60 mg Oral Daily Dixon, Rashaun M, NP   60 mg at 12/21/21 1021   haloperidol (HALDOL) tablet 1 mg  1 mg Oral [id number] Parks Ranger, DO  1 mg at 12/21/21 0825   levothyroxine (SYNTHROID) tablet 50 mcg  50 mcg Oral QAC breakfast Deloria Lair, NP   50 mcg at 12/21/21 0825   lidocaine (LIDODERM) 5 % 1 patch  1 patch Transdermal Daily PRN Rulon Sera, MD   1 patch at 12/20/21 1832   LORazepam (ATIVAN) tablet 1 mg  1 mg Oral QHS Parks Ranger, DO       magnesium hydroxide (MILK OF MAGNESIA) suspension 30 mL  30 mL Oral Daily PRN Deloria Lair, NP       meloxicam (MOBIC) tablet 7.5 mg  7.5 mg Oral Daily Parks Ranger, DO   7.5 mg at 12/21/21 1020   metFORMIN (GLUCOPHAGE) tablet 1,000 mg  1,000 mg Oral BID WC Parks Ranger, DO       OLANZapine [facility]) tablet 5 mg  5 mg Oral Q4H PRN Parks Ranger, DO   5 mg at 12/16/21 2109   prazosin (MINIPRESS) capsule 2 mg  2 mg Oral QHS Parks Ranger, DO   2 mg at 12/20/21 2118   propranolol (INDERAL) tablet 20 mg  20 mg Oral BID WC Parks Ranger, DO   20 mg at 12/21/21 0814   QUEtiapine (SEROQUEL) tablet 200 mg  200 mg Oral QHS Parks Ranger, DO       traZODone (DESYREL) tablet 100 mg  100 mg Oral QHS PRN Parks Ranger, DO        Lab Results: No results found for this or any previous visit (from the past 48 hour(s)).  Blood Alcohol level:  Lab Results  Component Value Date   ETH <10 48/18/5631    Metabolic Disorder Labs: Lab Results  Component Value Date   HGBA1C 6.4 10/27/2021    No results found for: PROLACTIN Lab Results  Component Value Date   CHOL 186 10/19/2021   TRIG 140.0 10/19/2021   HDL 70.90 10/19/2021   CHOLHDL 3 10/19/2021   VLDL 28.0 10/19/2021   LDLCALC 87 10/19/2021   LDLCALC 99 09/22/2020    Physical Findings: AIMS:  , ,  ,  ,    CIWA:    COWS:     Musculoskeletal: Strength & Muscle Tone: within normal limits Gait & Station: normal Patient leans: N/A  Psychiatric Specialty Exam:  Presentation  General Appearance: Bizarre; Disheveled  Eye Contact:Minimal  Speech:Pressured  Speech Volume:Increased  Handedness:Right   Mood and Affect  Mood:Angry; Depressed; Dysphoric; Hopeless  Affect:Depressed; Tearful; Full Range   Thought Process  Thought Processes:Disorganized; Irrevelant  Descriptions of Associations:Loose  Orientation:Partial  Thought Content:Delusions; Illogical; Scattered; Paranoid Ideation  History of Schizophrenia/Schizoaffective disorder:No  Duration of Psychotic Symptoms:N/A  Hallucinations:No data recorded Ideas of Reference:Delusions; Paranoia  Suicidal Thoughts:No data recorded Homicidal Thoughts:No data recorded  Sensorium  Memory:Remote Poor  Judgment:Impaired  Insight:None   Executive Functions  Concentration:Poor  Attention Span:Poor  Recall:Poor  Fund of Knowledge:Poor  Language:Poor   Psychomotor Activity  Psychomotor Activity:No data recorded  Assets  Assets:Financial Resources/Insurance; Housing; Intimacy; Social Support   Sleep  Sleep:No data recorded   Physical Exam: Physical Exam Vitals and nursing note reviewed.  Constitutional:      Appearance: Normal appearance. She is normal weight.  Neurological:     General: No focal deficit present.     Mental Status: She is alert and oriented to person, place, and time.  Psychiatric:        Attention and Perception: Attention normal. She perceives visual hallucinations.  Mood and Affect: Mood and  affect normal.        Speech: Speech normal.        Behavior: Behavior normal. Behavior is cooperative.        Thought Content: Thought content normal.        Cognition and Memory: Memory is impaired.        Judgment: Judgment normal.   Review of Systems  Constitutional: Negative.   HENT: Negative.    Eyes: Negative.   Respiratory: Negative.    Cardiovascular: Negative.   Gastrointestinal: Negative.   Genitourinary: Negative.   Musculoskeletal: Negative.   Skin: Negative.   Neurological: Negative.   Endo/Heme/Allergies: Negative.   Psychiatric/Behavioral:  Positive for hallucinations and memory loss. The patient has insomnia.   Blood pressure 134/66, pulse 68, temperature 98 F (36.7 C), temperature source Oral, resp. rate 18, height 5\' 2"  (1.575 m), weight 86.2 kg, SpO2 99 %. Body mass index is 34.75 kg/m.   Treatment Plan Summary: Daily contact with patient to assess and evaluate symptoms and progress in treatment, Medication management, and Plan increase Seroquel to 200 mg at bedtime and schedule Ativan 1 mg at bedtime.  Parks Ranger, DO 12/21/2021, 10:23 AM

## 2021-12-21 NOTE — Plan of Care (Signed)
Pt Calm and Cooperative during assessment.  Pt denies SI/HI/Avh and contracts for safety.  Pt denies Anxiety and Depression.  Pt denies restful sleep and verbalized "I was in [place]". Encouragement and Support given.  Pt did have minimal participation in therapeutic milieu.  Pt did have adequate intake.  Pt did comply with scheduled medications as ordered by Provider.  Pt denies any adverse affects from medications given.  Informed pt to notify nursing staff if any needs arise. Q 15 minute safety checks in place.  Will continue plan of care.   Problem: Education: Goal: Knowledge of [facility] Education information/materials will improve Outcome: Progressing Goal: Emotional status will improve Outcome: Progressing Goal: Verbalization of understanding the information provided will improve Outcome: Progressing   Problem: Activity: Goal: Interest or engagement in activities will improve Outcome: Progressing   Problem: Activity: Goal: Sleeping patterns will improve Outcome: Not Progressing

## 2021-12-22 ENCOUNTER — Other Ambulatory Visit: Payer: Self-pay | Admitting: Physician Assistant

## 2021-12-22 MED ORDER — METFORMIN HCL 1000 MG PO TABS: 1000.0000 mg | ORAL_TABLET | Freq: Two times a day (BID) | ORAL | 2 refills | Status: DC

## 2021-12-22 MED ORDER — PROPRANOLOL HCL 20 MG PO TABS: 20.0000 mg | ORAL_TABLET | Freq: Two times a day (BID) | ORAL | 2 refills | Status: DC

## 2021-12-22 MED ORDER — LIDOCAINE 5 % EX PTCH: 1.0000 | MEDICATED_PATCH | Freq: Every day | CUTANEOUS | 0 refills | Status: DC | PRN

## 2021-12-22 MED ORDER — DULOXETINE HCL 60 MG PO CPEP: 60.0000 mg | ORAL_CAPSULE | Freq: Every day | ORAL | 0 refills | Status: DC

## 2021-12-22 MED ORDER — QUETIAPINE FUMARATE 200 MG PO TABS: 200.0000 mg | ORAL_TABLET | Freq: Every day | ORAL | 2 refills | Status: DC

## 2021-12-22 MED ORDER — LORAZEPAM 1 MG PO TABS: 1.0000 mg | ORAL_TABLET | Freq: Every day | ORAL | 0 refills | Status: DC

## 2021-12-22 MED ORDER — MELOXICAM 7.5 MG PO TABS: 7.5000 mg | ORAL_TABLET | Freq: Every day | ORAL | 2 refills | Status: DC

## 2021-12-22 MED ORDER — DONEPEZIL HCL 5 MG PO TABS: 5.0000 mg | ORAL_TABLET | Freq: Every day | ORAL | 2 refills | Status: DC

## 2021-12-22 MED ORDER — HALOPERIDOL 1 MG PO TABS: 1.0000 mg | ORAL_TABLET | ORAL | 2 refills | Status: DC

## 2021-12-22 MED ORDER — PRAZOSIN HCL 2 MG PO CAPS: 2.0000 mg | ORAL_CAPSULE | Freq: Every day | ORAL | 2 refills | Status: DC

## 2021-12-22 NOTE — BHH Suicide Risk Assessment (Signed)
[facility] Discharge Suicide Risk Assessment   Principal Problem: Altered mental status Discharge Diagnoses: Principal Problem:   Altered mental status   Total Time spent with patient: 1 hour  Musculoskeletal: Strength & Muscle Tone: within normal limits Gait & Station: normal Patient leans: N/A  Psychiatric Specialty Exam  Presentation  General Appearance: Bizarre; Disheveled  Eye Contact:Minimal  Speech:Pressured  Speech Volume:Increased  Handedness:Right   Mood and Affect  Mood:Angry; Depressed; Dysphoric; Hopeless  Duration of Depression Symptoms: No data recorded Affect:Depressed; Tearful; Full Range   Thought Process  Thought Processes:Disorganized; Irrevelant  Descriptions of Associations:Loose  Orientation:Partial  Thought Content:Delusions; Illogical; Scattered; Paranoid Ideation  History of Schizophrenia/Schizoaffective disorder:No  Duration of Psychotic Symptoms:N/A  Hallucinations:No data recorded Ideas of Reference:Delusions; Paranoia  Suicidal Thoughts:No data recorded Homicidal Thoughts:No data recorded  Sensorium  Memory:Remote Poor  Judgment:Impaired  Insight:None   Executive Functions  Concentration:Poor  Attention Span:Poor  Recall:Poor  Fund of Knowledge:Poor  Language:Poor   Psychomotor Activity  Psychomotor Activity:No data recorded  Assets  Assets:Financial Resources/Insurance; Housing; Intimacy; Social Support   Sleep  Sleep:No data recorded  Physical Exam: Physical Exam Vitals and nursing note reviewed.  Constitutional:      Appearance: Normal appearance. She is normal weight.  Neurological:     General: No focal deficit present.     Mental Status: She is alert and oriented to person, place, and time.  Psychiatric:        Attention and Perception: Attention and perception normal.        Mood and Affect: Mood and affect normal.        Speech: Speech normal.        Behavior: Behavior normal. Behavior is  cooperative.        Thought Content: Thought content normal.        Cognition and Memory: Memory is impaired.        Judgment: Judgment normal.   Review of Systems  Constitutional: Negative.   HENT: Negative.    Eyes: Negative.   Respiratory: Negative.    Cardiovascular: Negative.   Gastrointestinal: Negative.   Genitourinary: Negative.   Musculoskeletal: Negative.   Skin: Negative.   Neurological: Negative.   Endo/Heme/Allergies: Negative.   Psychiatric/Behavioral:  Positive for memory loss.   Blood pressure (!) 109/55, pulse 81, temperature 97.8 F (36.6 C), temperature source Oral, resp. rate 18, height 5\' 2"  (1.575 m), weight 86.2 kg, SpO2 97 %. Body mass index is 34.75 kg/m.  Mental Status Per Nursing Assessment::   On Admission:  NA  Demographic Factors:  Caucasian  Loss Factors: NA  Historical Factors: NA  Risk Reduction Factors:   Living with another person, especially a relative, Positive social support, Positive therapeutic relationship, and Positive coping skills or problem solving skills  Continued Clinical Symptoms:  Bipolar Disorder:   Bipolar II  Cognitive Features That Contribute To Risk:  Loss of executive function    Suicide Risk:  Minimal: No identifiable suicidal ideation.  Patients presenting with no risk factors but with morbid ruminations; may be classified as minimal risk based on the severity of the depressive symptoms   Follow-up Information     [facility] Follow up on 01/05/2022.   Specialty: Behavioral Health Why: You have an appointment scheduled for 2pm in person with Dr. Adelene Idler. Thanks! Contact information: [place] [address] [phone]         [facility] Follow up.   WhySharmon Revere [phone] from  [facility] will contact you with follow up instructions for starting home health physical therapy. Thanks! Contact information: [place] [address] [phone]                 Plan Of Care/Follow-up recommendations: [place], DO 12/22/2021, 9:41 AM

## 2021-12-22 NOTE — Progress Notes (Signed)
Patient alert and oriented x 2. Affect is pleasant and calm. Denies anxiety, SI/HI or AVH.  Patient states they will try to keep themselves safe when they return home.  Reviewed discharge instructions with patient including follow up appointment with provider, medication and prescriptions. Questions answered and understanding verbalized.  Discharge packet given. All belongings returned to patient after verification completed by staff.    Patient escorted by staff off unit at this time stable without complaint.

## 2021-12-22 NOTE — Progress Notes (Signed)
°  [facility] Adult Case Management Discharge Plan :  Will you be returning to the same living situation after discharge:  Yes,  Patient is to discharge to place of residence.  At discharge, do you have transportation home?: Yes,  Patient's husband to assist with transportation at time of discharge.  Do you have the ability to pay for your medications: Yes,  Medicare Part A and B  Release of information consent forms completed and in the chart;  Patient's signature needed at discharge.  Patient to Follow up at:  Follow-up Information     [facility] Follow up on 01/05/2022.   Specialty: Behavioral Health Why: You have an appointment scheduled for 2pm in person with Dr. Adelene Idler. Thanks! Contact information: [place] [address] [phone]         [facility] Follow up.   Why: Sharmon Revere [phone] from [facility] will contact you with follow up instructions for starting home health physical therapy. Thanks! Contact information: [place] [address] [phone]                Next level of care provider has access to [place] and Suicide Prevention discussed: Yes,  SPE completed with patient, declined consent for CSW to reach collateral.    Has patient been referred to the Quitline?: Patient refused referral  Patient has been referred for addiction treatment: N/A Patient denies substance use, UDS negative for all substance aside from prescribed benzodiazapine (Alprazolam).   Durenda Hurt, LCSWA 12/22/2021, 11:25 AM

## 2021-12-22 NOTE — Discharge Summary (Signed)
Physician Discharge Summary Note  Patient:  Megan Duncan is an 76 y.o., female MRN:  [id number] DOB:  1946-06-20 Patient phone:  [phone] (home)  Patient address:   [place] [address],  Total Time spent with patient: 1 hour  Date of Admission:  12/03/2021 Date of Discharge: 12/22/2021  Reason for Admission:  Megan Duncan is a 76 y.o. female who presents to the Emergency Department complaining of multiple complaints.  She presents to the ED accompanied by her husband who provides the majority of the hx. She has been evaluated recently in the ED as well PCP office.  Her ED visit one week ago was for a lithium check.  Since her recent visit she has experienced worsening anxiety as well as sob.  Her SOB is worse with lying down but it is present with sitting.  She has been unable to sleep at night for the last three nights but sleeps during the day without difficulty.  During the day she is able to sleep lying down.     She has associated nausea for one month or more.     She now has hallucinations that started in the last week - both auditory and visual.  She states things like I want to go to the bedroom and she is already there or requesting to go to her home and she is already home.  Appears to be responding to stimuli that is not present.     No fever.  Complains of back pain (for the last two years).   Complained of right sided abdominal pain several days ago - now gone.  No cough. No dysuria.    She requested to go to the hospital tonight due to her ongoing back pain.  Her husband is concerned that she is not eating enough.     Has a hx/o DM, HTN, dementia - recently diagnosed. Currently scheduled for outpt MRI in the next week for further work up of dementia.     When patient questioned about her history she states that she is experience back pain, left upper quadrant abdominal pain, nausea.  She states that she does not want to eat due to nausea and  abdominal pain.  She also states that she does not know what happened earlier and she became very anxious and upset with her husband.    Principal Problem: Altered mental status Discharge Diagnoses: Principal Problem:   Altered mental status   Past Psychiatric History: [facility]. Hx of Bipolar Disorder and Lithium  Past Medical History:  Past Medical History:  Diagnosis Date   Acute pain of right shoulder 09/24/2018   Acute upper respiratory infection 09/19/2010   Allergic rhinitis 10/03/2016   Anterolisthesis of lumbar spine    Arthritis    hands   Bilateral hip pain 09/02/2014   Bipolar II disorder    Cellulitis and abscess of trunk 09/15/2009   Ceruminosis 03/04/2014   Cervicalgia 06/20/2007   Chronic low back pain 06/20/2007   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-sided sciatica 08/06/2012   COPD (chronic obstructive pulmonary disease) ([place])    Deformity of finger 07/15/2014   Degeneration of lumbar intervertebral disc 08/22/2020   Degenerative lumbar spinal stenosis    Dermatitis 98/33/8250   Diastolic dysfunction    Per pt, diagnosed after [facility]   Diverticulosis    DOE (dyspnea on exertion) 07/20/2016   Essential (primary) hypertension 04/23/2007   Estrogen deficiency 09/01/2017   External hemorrhoid 08/01/2015  Fibromyalgia    Foraminal stenosis of lumbar region 08/22/2020   Generalized anxiety disorder 09/01/2018   Hyperlipidemia    Inflammatory disease of uterus 04/23/2007   Left flank pain 07/13/2021   Left upper quadrant pain 09/01/2017   Lower abdominal pain 08/06/2012   Lumbar back pain with radiculopathy affecting left lower extremity 01/24/2010   Lumbar radiculopathy 01/26/2021   Lump in upper inner quadrant of left breast 07/13/2021   Major depressive disorder    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   Otitis media 09/05/2010   Palpitations 07/20/2016   Peripheral neuropathy 02/22/2010   Retrolisthesis  of vertebrae 08/22/2020   Shingles outbreak 04/14/2015   Sinusitis, acute maxillary 09/14/2013   Stress incontinence, female 08/01/2015   Thoracic aortic atherosclerosis 05/14/2017   Tinea corporis 09/01/2017   Tinnitus 03/04/2014   Torticollis 03/04/2014   Type II diabetes mellitus 06/05/2018    Past Surgical History:  Procedure Laterality Date   APPENDECTOMY     EYE SURGERY     KNEE ARTHROSCOPY Right 11/12/2006   TONSILLECTOMY     TOTAL ABDOMINAL HYSTERECTOMY     Family History:  Family History  Problem Relation Age of Onset   Hypertension Mother    Hiatal hernia Mother    Diabetes Father    Hypertension Father    Heart attack Father    Alcoholism Father    Bipolar disorder Father    Mental illness Sister    Suicidality Sister    Colon cancer Maternal Aunt 34   Breast cancer Maternal Aunt    Bipolar disorder Paternal Aunt    Family Psychiatric  History: Sister committed suicide Social History:  Social History   Substance and Sexual Activity  Alcohol Use Yes   Alcohol/week: 0.0 - 1.0 standard drinks   Comment: occasional wine     Social History   Substance and Sexual Activity  Drug Use No    Social History   Socioeconomic History   Marital status: Married    Spouse name: Kassidee Narciso   Number of children: 3   Years of education: 14   Highest education level: Some college, no degree  Occupational History   Occupation: retired  Tobacco Use   Smoking status: Former    Packs/day: 1.50    Years: 40.00    Pack years: 60.00    Types: Cigarettes    Quit date: 07/02/2004    Years since quitting: 17.4    Passive exposure: Past   Smokeless tobacco: Never   Tobacco comments:    Verified by Aldona Lento (Husband), & Lauralee Evener (Daughter)  Vaping Use   Vaping Use: Never used  Substance and Sexual Activity   Alcohol use: Yes    Alcohol/week: 0.0 - 1.0 standard drinks    Comment: occasional wine   Drug use: No   Sexual activity: Never  Other Topics  Concern   Not on file  Social History Narrative   Lives with husband and grandson she is raising.   11/07/21 Lives with husband   Social Determinants of Health   Financial Resource Strain: Medium Risk   Difficulty of Paying Living Expenses: Somewhat hard  Food Insecurity: No Food Insecurity   Worried About Charity fundraiser in the Last Year: Never true   Ran Out of Food in the Last Year: Never true  Transportation Needs: No Transportation Needs   Lack of Transportation (Medical): No   Lack of Transportation (Non-Medical): No  Physical Activity: Inactive  Days of Exercise per Week: 0 days   Minutes of Exercise per Session: 0 min  Stress: No Stress Concern Present   Feeling of Stress : Not at all  Social Connections: Socially Integrated   Frequency of Communication with Friends and Family: More than three times a week   Frequency of Social Gatherings with Friends and Family: More than three times a week   Attends Religious Services: 1 to 4 times per year   Active Member of [employer] or Organizations: Yes   Attends Archivist Meetings: More than 4 times per year   Marital Status: Married    Hospital Course: Marlowe Kays was admitted to geriatric psychiatry under routine orders and precautions.  Initially she was lucid and said that she did not know why she was here.  Later that night she became confused and combative.  She was initially started on Zyprexa and she required as needed Ativan and Zyprexa for a couple days.  She did calm down a little bit and her medicines were changed to Haldol which eventually ended up being 1 mg twice a day.  Her Cymbalta was increased from 30 to 60 mg/day.  Her lithium was discontinued.  She was initiated on Aricept 5 mg at bedtime.  She had memory loss of her admission.  As time went on her cognition improved and she was able to carry on conversations and she was no longer irritable or confused, and she started sleeping better.  Sleep had always been a  problem and sometimes she would have nightmares which were sometimes thought to be hallucinations by the night nurses.on the nights that she was up and having trouble sleeping nurses would give her Ativan which helped her fall back to sleep.  We just went ahead and scheduled the Ativan and increase the Seroquel.  She did well on Thorazine for a while but we discontinued it and we initiated Seroquel.  Seroquel was titrated up to 200 mg at night.  Propranolol was decreased to 20 mg twice a day and Minipress was started for her nightmares with some success.  Physical therapy was ordered and they worked with her for a few days.  Her ambulation improved.  She was started on Mobic for her back pain.  Overall, she improved day after day.  It was felt that she maximized hospitalization she was discharged home.  On the day of discharge she denied suicidal ideation, homicidal ideation, auditory or visual hallucinations.  Her judgment and insight were much improved. No EPS/TD were ever noted.  Physical Findings: AIMS:  , ,  ,  ,    CIWA:    COWS:     Musculoskeletal: Strength & Muscle Tone: within normal limits Gait & Station: unsteady Patient leans: N/A   Psychiatric Specialty Exam:  Presentation  General Appearance: Bizarre; Disheveled  Eye Contact:Minimal  Speech:Pressured  Speech Volume:Increased  Handedness:Right   Mood and Affect  Mood:Angry; Depressed; Dysphoric; Hopeless  Affect:Depressed; Tearful; Full Range   Thought Process  Thought Processes:Disorganized; Irrevelant  Descriptions of Associations:Loose  Orientation:Partial  Thought Content:Delusions; Illogical; Scattered; Paranoid Ideation  History of Schizophrenia/Schizoaffective disorder:No  Duration of Psychotic Symptoms:N/A  Hallucinations:No data recorded Ideas of Reference:Delusions; Paranoia  Suicidal Thoughts:No data recorded Homicidal Thoughts:No data recorded  Sensorium  Memory:Remote  Poor  Judgment:Impaired  Insight:None   Executive Functions  Concentration:Poor  Attention Span:Poor  Recall:Poor  Fund of Knowledge:Poor  Language:Poor   Psychomotor Activity  Psychomotor Activity:No data recorded  Assets  Assets:Financial Resources/Insurance; Housing;  Intimacy; Social Support   Sleep  Sleep:No data recorded   Physical Exam: Physical Exam Vitals and nursing note reviewed.  Constitutional:      Appearance: Normal appearance. She is normal weight.  Neurological:     General: No focal deficit present.     Mental Status: She is alert and oriented to person, place, and time.  Psychiatric:        Attention and Perception: Attention and perception normal.        Mood and Affect: Mood and affect normal.        Speech: Speech normal.        Behavior: Behavior normal. Behavior is cooperative.        Thought Content: Thought content normal.        Cognition and Memory: Cognition normal. Memory is impaired.        Judgment: Judgment normal.   Review of Systems  Constitutional: Negative.   HENT: Negative.    Eyes: Negative.   Respiratory: Negative.    Cardiovascular: Negative.   Gastrointestinal: Negative.   Genitourinary: Negative.   Musculoskeletal: Negative.   Skin: Negative.   Neurological: Negative.   Endo/Heme/Allergies: Negative.   Psychiatric/Behavioral:  Positive for memory loss.   Blood pressure (!) 109/55, pulse 81, temperature 97.8 F (36.6 C), temperature source Oral, resp. rate 18, height '5\' 2"'  (1.575 m), weight 86.2 kg, SpO2 97 %. Body mass index is 34.75 kg/m.   Social History   Tobacco Use  Smoking Status Former   Packs/day: 1.50   Years: 40.00   Pack years: 60.00   Types: Cigarettes   Quit date: 07/02/2004   Years since quitting: 17.4   Passive exposure: Past  Smokeless Tobacco Never  Tobacco Comments   Verified by Aldona Lento (Husband), & Lauralee Evener (Daughter)   Tobacco Cessation:  N/A, patient does not  currently use tobacco products   Blood Alcohol level:  Lab Results  Component Value Date   ETH <10 18/56/3149    Metabolic Disorder Labs:  Lab Results  Component Value Date   HGBA1C 6.4 10/27/2021   No results found for: PROLACTIN Lab Results  Component Value Date   CHOL 186 10/19/2021   TRIG 140.0 10/19/2021   HDL 70.90 10/19/2021   CHOLHDL 3 10/19/2021   VLDL 28.0 10/19/2021   LDLCALC 87 10/19/2021   [place] 99 09/22/2020    See Psychiatric Specialty Exam and Suicide Risk Assessment completed by Attending Physician prior to discharge.  Discharge destination:  Home  Is patient on multiple antipsychotic therapies at discharge:  No   Has Patient had three or more failed trials of antipsychotic monotherapy by history:  No  Recommended Plan for Multiple Antipsychotic Therapies: NA   Allergies as of 12/22/2021       Reactions   Atorvastatin Other (See Comments)   Significant rise in liver tests   Codeine Nausea And Vomiting   Diclofenac Other (See Comments)   Elevated LFT's   Doxycycline    Extreme nausea   Sulfonamide Derivatives Swelling        Medication List     STOP taking these medications    ALPRAZolam 0.5 MG tablet Commonly known as: Xanax   amLODipine 5 MG tablet Commonly known as: NORVASC   cephALEXin 500 MG capsule Commonly known as: KEFLEX   clorazepate 3.75 MG tablet Commonly known as: TRANXENE   lithium carbonate 150 MG capsule   lithium carbonate 300 MG capsule   traZODone 50 MG tablet Commonly known  as: DESYREL       TAKE these medications      Indication  albuterol 108 (90 Base) MCG/ACT inhaler Commonly known as: Ventolin HFA Inhale 2 puffs into the lungs every 4 (four) hours as needed. For shortness of breath and wheezing    blood glucose meter kit and supplies Dispense based on patient and insurance preference. Use once a day.  DX Code: E11.9    donepezil 5 MG tablet Commonly known as: ARICEPT Take 1 tablet (5 mg  total) by mouth at bedtime.    DULoxetine 60 MG capsule Commonly known as: CYMBALTA Take 1 capsule (60 mg total) by mouth daily. Start taking on: December 23, 2021 What changed:  how much to take how to take this when to take this additional instructions Another medication with the same name was removed. Continue taking this medication, and follow the directions you see here.    glucose blood test strip Use as instructed once a day.  Dx Code: E11.9    haloperidol 1 MG tablet Commonly known as: HALDOL Take 1 tablet (1 mg total) by mouth 2 (two) times daily at 8 am and 4 pm.    hydrocortisone-pramoxine 2.5-1 % rectal cream Commonly known as: Analpram HC Place 1 application rectally 3 (three) times daily.    Lancets Misc Use as directed once a day.  Dx code: E11.9    levothyroxine 50 MCG tablet Commonly known as: SYNTHROID TAKE 1 TABLET BY MOUTH EVERY DAY BEFORE BREAKFAST What changed: See the new instructions.    lidocaine 5 % Commonly known as: LIDODERM Place 1 patch onto the skin daily as needed. Remove & Discard patch within 12 hours or as directed by MD    LORazepam 1 MG tablet Commonly known as: ATIVAN Take 1 tablet (1 mg total) by mouth at bedtime.    meloxicam 7.5 MG tablet Commonly known as: MOBIC Take 1 tablet (7.5 mg total) by mouth daily. Start taking on: December 23, 2021    metFORMIN 1000 MG tablet Commonly known as: GLUCOPHAGE Take 1 tablet (1,000 mg total) by mouth 2 (two) times daily with a meal.    ondansetron 4 MG tablet Commonly known as: ZOFRAN Take 1 tablet (4 mg total) by mouth every 8 (eight) hours as needed for nausea or vomiting.    prazosin 2 MG capsule Commonly known as: MINIPRESS Take 1 capsule (2 mg total) by mouth at bedtime.    propranolol 20 MG tablet Commonly known as: INDERAL Take 1 tablet (20 mg total) by mouth 2 (two) times daily with a meal.    QUEtiapine 200 MG tablet Commonly known as: SEROQUEL Take 1 tablet (200 mg  total) by mouth at bedtime. What changed:  medication strength how much to take         Follow-up Information     [facility] up on 01/05/2022.   Specialty: Behavioral Health Why: You have an appointment scheduled for 2pm in person with Dr. Adelene Idler. Thanks! Contact information: [place] [address] [phone]         [facility], [place] Follow up.   Why: Sharmon Revere [phone] from [facility] will contact you with follow up instructions for starting home health physical therapy. Thanks! Contact information: [place] [address] [phone]                 Follow-up recommendations:  [facility] with Dr. Deborah Chalk and [place].  Signed: Parks Ranger, DO 12/22/2021, 10:04 AM

## 2021-12-22 NOTE — Plan of Care (Signed)
Problem: Education: Goal: Knowledge of [facility] Education information/materials will improve 12/22/2021 1033 by Nolon Bussing, RN Outcome: Adequate for Discharge 12/22/2021 0630 by Nolon Bussing, RN Outcome: Progressing Goal: Emotional status will improve 12/22/2021 1033 by Nolon Bussing, RN Outcome: Adequate for Discharge 12/22/2021 1601 by Nolon Bussing, RN Outcome: Progressing Goal: Mental status will improve 12/22/2021 1033 by Nolon Bussing, RN Outcome: Adequate for Discharge 12/22/2021 0932 by Nolon Bussing, RN Outcome: Progressing Goal: Verbalization of understanding the information provided will improve 12/22/2021 1033 by Nolon Bussing, RN Outcome: Adequate for Discharge 12/22/2021 3557 by Nolon Bussing, RN Outcome: Progressing   Problem: Activity: Goal: Interest or engagement in activities will improve 12/22/2021 1033 by Nolon Bussing, RN Outcome: Adequate for Discharge 12/22/2021 3220 by Nolon Bussing, RN Outcome: Progressing Goal: Sleeping patterns will improve 12/22/2021 1033 by Nolon Bussing, RN Outcome: Adequate for Discharge 12/22/2021 2542 by Nolon Bussing, RN Outcome: Progressing   Problem: Coping: Goal: Ability to verbalize frustrations and anger appropriately will improve 12/22/2021 1033 by Nolon Bussing, RN Outcome: Adequate for Discharge 12/22/2021 7062 by Nolon Bussing, RN Outcome: Progressing Goal: Ability to demonstrate self-control will improve 12/22/2021 1033 by Nolon Bussing, RN Outcome: Adequate for Discharge 12/22/2021 0927 by Nolon Bussing, RN Outcome: Progressing   Problem: Health Behavior/Discharge Planning: Goal: Identification of resources available to assist in meeting health care needs will improve 12/22/2021 1033 by Nolon Bussing, RN Outcome: Adequate for Discharge 12/22/2021 3762 by Nolon Bussing, RN Outcome: Progressing Goal: Compliance with  treatment plan for underlying cause of condition will improve 12/22/2021 1033 by Nolon Bussing, RN Outcome: Adequate for Discharge 12/22/2021 8315 by Nolon Bussing, RN Outcome: Progressing   Problem: Physical Regulation: Goal: Ability to maintain clinical measurements within normal limits will improve 12/22/2021 1033 by Nolon Bussing, RN Outcome: Adequate for Discharge 12/22/2021 1761 by Nolon Bussing, RN Outcome: Progressing   Problem: Safety: Goal: Periods of time without injury will increase 12/22/2021 1033 by Nolon Bussing, RN Outcome: Adequate for Discharge 12/22/2021 6073 by Nolon Bussing, RN Outcome: Progressing   Problem: Education: Goal: Utilization of techniques to improve thought processes will improve 12/22/2021 1033 by Nolon Bussing, RN Outcome: Adequate for Discharge 12/22/2021 7106 by Nolon Bussing, RN Outcome: Progressing Goal: Knowledge of the prescribed therapeutic regimen will improve 12/22/2021 1033 by Nolon Bussing, RN Outcome: Adequate for Discharge 12/22/2021 2694 by Nolon Bussing, RN Outcome: Progressing   Problem: Activity: Goal: Interest or engagement in leisure activities will improve 12/22/2021 1033 by Nolon Bussing, RN Outcome: Adequate for Discharge 12/22/2021 8546 by Nolon Bussing, RN Outcome: Progressing Goal: Imbalance in normal sleep/wake cycle will improve 12/22/2021 1033 by Nolon Bussing, RN Outcome: Adequate for Discharge 12/22/2021 2703 by Nolon Bussing, RN Outcome: Progressing   Problem: Coping: Goal: Coping ability will improve 12/22/2021 1033 by Nolon Bussing, RN Outcome: Adequate for Discharge 12/22/2021 5009 by Nolon Bussing, RN Outcome: Progressing Goal: Will verbalize feelings 12/22/2021 1033 by Nolon Bussing, RN Outcome: Adequate for Discharge 12/22/2021 3818 by Nolon Bussing, RN Outcome: Progressing   Problem: Health Behavior/Discharge  Planning: Goal: Ability to make decisions will improve 12/22/2021 1033 by Nolon Bussing, RN Outcome: Adequate for Discharge 12/22/2021 2993 by Nolon Bussing, RN Outcome: Progressing Goal: Compliance with therapeutic regimen will improve 12/22/2021 1033 by Nolon Bussing, RN Outcome: Adequate for Discharge 12/22/2021  0927 by Nolon Bussing, RN Outcome: Progressing   Problem: Role Relationship: Goal: Will demonstrate positive changes in social behaviors and relationships 12/22/2021 1033 by Nolon Bussing, RN Outcome: Adequate for Discharge 12/22/2021 0927 by Nolon Bussing, RN Outcome: Progressing   Problem: Safety: Goal: Ability to disclose and discuss suicidal ideas will improve 12/22/2021 1033 by Nolon Bussing, RN Outcome: Adequate for Discharge 12/22/2021 4982 by Nolon Bussing, RN Outcome: Progressing Goal: Ability to identify and utilize support systems that promote safety will improve 12/22/2021 1033 by Nolon Bussing, RN Outcome: Adequate for Discharge 12/22/2021 6415 by Nolon Bussing, RN Outcome: Progressing   Problem: Self-Concept: Goal: Will verbalize positive feelings about self 12/22/2021 1033 by Nolon Bussing, RN Outcome: Adequate for Discharge 12/22/2021 8309 by Nolon Bussing, RN Outcome: Progressing Goal: Level of anxiety will decrease 12/22/2021 1033 by Nolon Bussing, RN Outcome: Adequate for Discharge 12/22/2021 4076 by Nolon Bussing, RN Outcome: Progressing

## 2021-12-22 NOTE — Plan of Care (Signed)
Patient is calm, pleasant and cooperative. Patient denies SI, HI, and AVH. Patient also denies depression and anxiety. Patient declines pain at this time. Scheduled medications administered as ordered. Patient compliant with all medications. Patient is active on the unit and ate breakfast and watched TV in the dayroom- appetite good. Patient is observed interacting appropriately with staff and other patients on the unit. Q 15 minutes Safety checks maintained. Will continue to monitor.   Problem: Education: Goal: Knowledge of [facility] Education information/materials will improve Outcome: Progressing Goal: Emotional status will improve Outcome: Progressing Goal: Mental status will improve Outcome: Progressing Goal: Verbalization of understanding the information provided will improve Outcome: Progressing   Problem: Activity: Goal: Interest or engagement in activities will improve Outcome: Progressing Goal: Sleeping patterns will improve Outcome: Progressing   Problem: Coping: Goal: Ability to verbalize frustrations and anger appropriately will improve Outcome: Progressing Goal: Ability to demonstrate self-control will improve Outcome: Progressing   Problem: Health Behavior/Discharge Planning: Goal: Identification of resources available to assist in meeting health care needs will improve Outcome: Progressing Goal: Compliance with treatment plan for underlying cause of condition will improve Outcome: Progressing   Problem: Physical Regulation: Goal: Ability to maintain clinical measurements within normal limits will improve Outcome: Progressing   Problem: Safety: Goal: Periods of time without injury will increase Outcome: Progressing   Problem: Education: Goal: Utilization of techniques to improve thought processes will improve Outcome: Progressing Goal: Knowledge of the prescribed therapeutic regimen will improve Outcome: Progressing   Problem: Activity: Goal:  Interest or engagement in leisure activities will improve Outcome: Progressing Goal: Imbalance in normal sleep/wake cycle will improve Outcome: Progressing   Problem: Coping: Goal: Coping ability will improve Outcome: Progressing Goal: Will verbalize feelings Outcome: Progressing   Problem: Health Behavior/Discharge Planning: Goal: Ability to make decisions will improve Outcome: Progressing Goal: Compliance with therapeutic regimen will improve Outcome: Progressing   Problem: Role Relationship: Goal: Will demonstrate positive changes in social behaviors and relationships Outcome: Progressing   Problem: Safety: Goal: Ability to disclose and discuss suicidal ideas will improve Outcome: Progressing Goal: Ability to identify and utilize support systems that promote safety will improve Outcome: Progressing   Problem: Self-Concept: Goal: Will verbalize positive feelings about self Outcome: Progressing Goal: Level of anxiety will decrease Outcome: Progressing

## 2021-12-26 ENCOUNTER — Ambulatory Visit: Payer: Medicare Other | Admitting: *Deleted

## 2021-12-26 DIAGNOSIS — E1151 Type 2 diabetes mellitus with diabetic peripheral angiopathy without gangrene: Secondary | ICD-10-CM

## 2021-12-26 DIAGNOSIS — M797 Fibromyalgia: Secondary | ICD-10-CM

## 2021-12-26 DIAGNOSIS — F3181 Bipolar II disorder: Secondary | ICD-10-CM

## 2021-12-26 DIAGNOSIS — F0670 Mild neurocognitive disorder due to known physiological condition without behavioral disturbance: Secondary | ICD-10-CM

## 2021-12-26 DIAGNOSIS — I700 Atherosclerosis of aorta: Secondary | ICD-10-CM

## 2021-12-26 DIAGNOSIS — J449 Chronic obstructive pulmonary disease, unspecified: Secondary | ICD-10-CM

## 2021-12-26 DIAGNOSIS — E1169 Type 2 diabetes mellitus with other specified complication: Secondary | ICD-10-CM

## 2021-12-26 DIAGNOSIS — G609 Hereditary and idiopathic neuropathy, unspecified: Secondary | ICD-10-CM

## 2021-12-26 NOTE — Chronic Care Management (AMB) (Signed)
Chronic Care Management    Clinical Social Work Note  12/26/2021 Name: Megan Duncan MRN: [id number] DOB: 01/09/1946  Megan Duncan is a 76 y.o. year old female who is a primary care patient of Ann Held, DO. The CCM team was consulted to assist the patient with chronic disease management and/or care coordination needs related to: [employer], Mental Health Counseling and Resources, and Caregiver Stress.   Engaged with patient, patient's daughter, and patient's husband by telephone for follow up visit in response to provider referral for social work chronic care management and care coordination services.   Consent to Services:  The patient was given information about Chronic Care Management services, agreed to services, and gave verbal consent prior to initiation of services.  Please see initial visit note for detailed documentation.   Patient agreed to services and consent obtained.   Assessment: Review of patient past medical history, allergies, medications, and health status, including review of relevant consultants reports was performed today as part of a comprehensive evaluation and provision of chronic care management and care coordination services.     SDOH (Social Determinants of Health) assessments and interventions performed:    Advanced Directives Status: Not addressed in this encounter.  CCM Care Plan  Allergies  Allergen Reactions   Atorvastatin Other (See Comments)    Significant rise in liver tests   Codeine Nausea And Vomiting   Diclofenac Other (See Comments)    Elevated LFT's   Doxycycline     Extreme nausea   Sulfonamide Derivatives Swelling    Outpatient Encounter Medications as of 12/26/2021  Medication Sig   albuterol (VENTOLIN HFA) 108 (90 Base) MCG/ACT inhaler Inhale 2 puffs into the lungs every 4 (four) hours as needed. For shortness of breath and wheezing   blood glucose meter kit and supplies Dispense based on patient and  insurance preference. Use once a day.  DX Code: E11.9   donepezil (ARICEPT) 5 MG tablet Take 1 tablet (5 mg total) by mouth at bedtime.   DULoxetine (CYMBALTA) 60 MG capsule Take 1 capsule (60 mg total) by mouth daily.   glucose blood test strip Use as instructed once a day.  Dx Code: E11.9   haloperidol (HALDOL) 1 MG tablet Take 1 tablet (1 mg total) by mouth 2 (two) times daily at 8 am and 4 pm.   hydrocortisone-pramoxine (ANALPRAM HC) 2.5-1 % rectal cream Place 1 application rectally 3 (three) times daily.   Lancets MISC Use as directed once a day.  Dx code: E11.9   levothyroxine (SYNTHROID) 50 MCG tablet TAKE 1 TABLET BY MOUTH EVERY DAY BEFORE BREAKFAST   lidocaine (LIDODERM) 5 % Place 1 patch onto the skin daily as needed. Remove & Discard patch within 12 hours or as directed by MD   LORazepam (ATIVAN) 1 MG tablet Take 1 tablet (1 mg total) by mouth at bedtime.   meloxicam (MOBIC) 7.5 MG tablet Take 1 tablet (7.5 mg total) by mouth daily.   metFORMIN (GLUCOPHAGE) 1000 MG tablet Take 1 tablet (1,000 mg total) by mouth 2 (two) times daily with a meal.   ondansetron (ZOFRAN) 4 MG tablet Take 1 tablet (4 mg total) by mouth every 8 (eight) hours as needed for nausea or vomiting.   prazosin (MINIPRESS) 2 MG capsule Take 1 capsule (2 mg total) by mouth at bedtime.   propranolol (INDERAL) 20 MG tablet Take 1 tablet (20 mg total) by mouth 2 (two) times daily with a meal.   QUEtiapine (  SEROQUEL) 200 MG tablet Take 1 tablet (200 mg total) by mouth at bedtime.   No facility-administered encounter medications on file as of 12/26/2021.    Patient Active Problem List   Diagnosis Date Noted   Altered mental status 12/03/2021   Anxiety 11/28/2021   Memory loss 10/26/2021   Bilateral hearing loss 10/26/2021   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   Degenerative lumbar spinal stenosis    Anterolisthesis of lumbar spine    Lump in upper inner quadrant of left breast  07/13/2021   Left flank pain 07/13/2021   Lumbar radiculopathy 01/26/2021   Body mass index (BMI) 36.0-36.9, adult 01/11/2021   Degeneration of lumbar intervertebral disc 08/22/2020   Retrolisthesis of vertebrae 08/22/2020   Foraminal stenosis of lumbar region 08/22/2020   Generalized anxiety disorder 09/01/2018   Type II diabetes mellitus 06/05/2018   Tinea corporis 09/01/2017   Left upper quadrant pain 09/01/2017   Estrogen deficiency 09/01/2017   Thoracic aortic atherosclerosis 05/14/2017   COPD (chronic obstructive pulmonary disease) 10/30/2016   Allergic rhinitis 10/03/2016   Palpitations 07/20/2016   DOE (dyspnea on exertion) 07/20/2016   External hemorrhoid 08/01/2015   Stress incontinence, female 08/01/2015   Dermatitis 06/02/2015   Shingles outbreak 04/14/2015   Hyperlipidemia associated with type 2 diabetes mellitus ([place])    Fibromyalgia 10/14/2014   Bilateral hip pain 09/02/2014   Deformity of finger 07/15/2014   Ceruminosis 03/04/2014   Tinnitus 03/04/2014   Torticollis 03/04/2014   Bipolar 2 disorder 12/03/2013   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-sided sciatica 08/06/2012   Lower abdominal pain 08/06/2012   Otitis media 09/05/2010   Peripheral neuropathy 02/22/2010   Lumbar back pain with radiculopathy affecting left lower extremity 01/24/2010   Cervicalgia 06/20/2007   Chronic low back pain 06/20/2007   Essential (primary) hypertension 04/23/2007   Inflammatory disease of uterus 04/23/2007    Conditions to be addressed/monitored: Depression, Anxiety, Bipolar II Disorder, and Chronic Pain.  Level of Care Concerns, ADL/IADL Limitations, Mental Health Concerns, Social Isolation, Limited Access to Caregiver, Cognitive Deficits, Memory Deficits, and Lacks Knowledge of [employer].  Care Plan : LCSW Plan of Care  Updates made by Francis Gaines, LCSW since 12/26/2021 12:00 AM     Problem: Improve Quality of Life through [facility].    Priority: High     Goal: Improve Quality of Life through [facility].   Start Date: 11/27/2021  Expected End Date: 01/25/2022  This Visit's Progress: On track  Recent Progress: On track  Priority: High  Note:   Current Barriers:   Patient with Thoracic Aortic Atherosclerosis, Essential Hypertension, Chronic Obstructive Pulmonary Disease, Type II Diabetes Mellitus, Mild Neurocognitive Disorder, Bilateral Hearing Loss, Memory Loss, Fibromyalgia, Bipolar II Disorder, Generalized Anxiety Disorder, and Caregiver Stress needs Support, Education, Resources, Referrals, Advocacy, and Care Coordination to resolve unmet personal care needs in the home. Patient is unable to self-administer medications as prescribed, or consistently perform ADL's/IADL's independently. Lacks knowledge of available community agencies and resources. Clinical Goals:  Patient, husband, and daughter will work with LCSW, in an effort to coordinate in-home care services, through [employer].   Patient will demonstrate improved health management independence, as evidenced by having in-home care services in place. Interventions: Collaboration with Primary Care Physician, Dr. Roma Schanz regarding development and update of comprehensive plan of care, as evidenced by provider attestation and co-signature. Inter-disciplinary care team collaboration (see longitudinal plan of care). Clinical Interventions: Problem  Solving/Task Centered Solutions Developed. Quality of Sleep Assessed and Sleep Hygiene Techniques Promoted. Caregiver Stress Acknowledged. Consideration of In-Home Care Services Encouraged. Emotional Support Provided and Verbalization of Feelings Discussed. Patient Goals/Self-Care Activities:  Resume services with LCSW on a bi-weekly basis, in an effort to coordinate hours of in-home care services through long-term care insurance policy. Collaboration with Sharmon Revere, [place] Nurse  with [facility] [phone]), to confirm orders for Home Health Physical Therapy, Occupational Therapy, and a [facility], and to determine date for start of care. Keep appointment with Donnal Moat, Psychiatric Physician Assistant - Certified with [place] [phone]), scheduled on 12/29/2021 at 10:30 am. Please contact LCSW directly (# [phone]) if you have questions, need assistance, or if additional social work needs are identified between now and our next scheduled telephone outreach call.  Follow-Up Plan:  01/05/2022 at 9:45 am      [place] Social Worker [facility] [employer] [facility] [phone]

## 2021-12-26 NOTE — Patient Instructions (Signed)
Visit Information  Thank you for taking time to visit with me today. Please don't hesitate to contact me if I can be of assistance to you before our next scheduled telephone appointment.  Following are the goals we discussed today:  Patient Goals/Self-Care Activities:  Resume services with LCSW on a bi-weekly basis, in an effort to coordinate hours of in-home care services through long-term care insurance policy. Collaboration with Sharmon Revere, [place] Nurse with [facility] [phone]), to confirm orders for Home Health Physical Therapy, Occupational Therapy, and a [facility], and to determine date for start of care. Keep appointment with Donnal Moat, Psychiatric Physician Assistant - Certified with [place] [phone]), scheduled on 12/29/2021 at 10:30 am. Please contact LCSW directly (# [phone]) if you have questions, need assistance, or if additional social work needs are identified between now and our next scheduled telephone outreach call.  Follow-Up Plan:  01/05/2022 at 9:45 am  Please call the care guide team at [phone] if you need to cancel or reschedule your appointment.   If you are experiencing a Mental Health or [place] or need someone to talk to, please call the Suicide and Crisis Lifeline: 988 call the [place] National Suicide Prevention Lifeline: (732) 332-7950 or TTY: 902-222-1700 TTY 315-042-4558) to talk to a trained counselor call 1-800-273-TALK (toll free, 24 hour hotline) go to [facility] [address] [place] [phone]) call the [place]: ([phone]) [phone] call 911   Patient verbalizes understanding of instructions and care plan provided today and agrees to view in [place]. Active MyChart status confirmed with patient.    [place] Licensed Clinical Social Worker [facility] [employer] [facility] [phone]

## 2021-12-29 ENCOUNTER — Other Ambulatory Visit: Payer: Self-pay

## 2021-12-29 ENCOUNTER — Ambulatory Visit (INDEPENDENT_AMBULATORY_CARE_PROVIDER_SITE_OTHER): Payer: Medicare Other | Admitting: Physician Assistant

## 2021-12-29 ENCOUNTER — Encounter: Payer: Self-pay | Admitting: Physician Assistant

## 2021-12-29 VITALS — BP 144/76 | HR 54

## 2021-12-29 DIAGNOSIS — E032 Hypothyroidism due to medicaments and other exogenous substances: Secondary | ICD-10-CM | POA: Diagnosis not present

## 2021-12-29 DIAGNOSIS — F4310 Post-traumatic stress disorder, unspecified: Secondary | ICD-10-CM | POA: Diagnosis not present

## 2021-12-29 DIAGNOSIS — F411 Generalized anxiety disorder: Secondary | ICD-10-CM

## 2021-12-29 DIAGNOSIS — F515 Nightmare disorder: Secondary | ICD-10-CM | POA: Diagnosis not present

## 2021-12-29 DIAGNOSIS — F3181 Bipolar II disorder: Secondary | ICD-10-CM

## 2021-12-29 DIAGNOSIS — F323 Major depressive disorder, single episode, severe with psychotic features: Secondary | ICD-10-CM | POA: Diagnosis not present

## 2021-12-29 DIAGNOSIS — G2581 Restless legs syndrome: Secondary | ICD-10-CM | POA: Diagnosis not present

## 2021-12-29 DIAGNOSIS — R4189 Other symptoms and signs involving cognitive functions and awareness: Secondary | ICD-10-CM | POA: Diagnosis not present

## 2021-12-29 MED ORDER — PRAZOSIN HCL 1 MG PO CAPS: 1.0000 mg | ORAL_CAPSULE | Freq: Every day | ORAL | 2 refills | Status: DC

## 2021-12-29 NOTE — Progress Notes (Signed)
[facility] Check  Patient ID: Megan Duncan,  MRN: [id number]  PCP: Ann Held, DO  Date of Evaluation: 12/29/2021  time spent:40 minutes  Chief Complaint:  Chief Complaint   Depression; Anxiety; Memory Loss; Follow-up      HISTORY/CURRENT STATUS: HPI Follow up hospitalization, accompanied by husband Megan Duncan and daughter Megan Duncan. Most of hx per dtr and husband.   Was inpatient at [facility] for 3 weeks. Was taken off lithium and started on Haldol, Seroquel, and prazosin for nightmares.  Cymbalta and propranolol were continued.  Pt states "I lost my mind."  Since she has been home for this past week Megan Duncan and Megan Duncan both say her cognition is much better, she has mental clarity and remembers things more whereas she did not remember much of anything about the hospitalization, especially the first few weeks.  She was having hallucinations and also had tremor prior to the hospitalization.  Since she was taken off the lithium, she is not having tremor and she even feels more clearheaded.  Since getting home from the hospital last week she has been much more irritable.  She admits to being angry over little things that should not matter and very irritated, especially with her husband.  Anything he does is wrong.  She says this in front of him and even states that she is aggravated with all 3 of [name] in the room, but laughs about it.    Another problem is nightmares.  Her dreams are very vivid and it is hard to know what is real and what is a dream when she first wakes up.  She sometimes wakes up her husband because of crying out.  Prazosin was started in the hospital for that reason and it did seem to help at first.  She is very sleepy all the time.  She did not have that problem before going into the hospital.  No matter how much sleep she gets at night she still feels very sedated the next day.  Drifts off to sleep in the chair sometimes.  She was started on Aricept when  in the hospital.  Of course she is unable to tell if it is helpful or not at this point.  She is still having anxiety at times but nothing like she was having before and during the hospitalization.  She is not taking the Ativan routinely and hopes to get off of it.  It is difficult to say whether she is enjoying anything or not.  She has had chronic back pain for over a year, almost bedbound, but I think now some of her staying in bed was due to depression.  She is still pretty weak but she is getting more energy back since being home from the hospital this past week.  ADLs are within normal limits.  Personal hygiene is normal.  Does cry easily at times.  No suicidal or homicidal thoughts.  Patient denies increased energy with decreased need for sleep, no increased talkativeness, no racing thoughts, no impulsivity or risky behaviors, no increased spending, no grandiosity, no paranoia, and no hallucinations.  Review of Systems  Constitutional:  Positive for malaise/fatigue.  HENT: Negative.    Eyes: Negative.   Respiratory: Negative.    Cardiovascular: Negative.   Gastrointestinal: Negative.   Genitourinary: Negative.   Musculoskeletal:  Positive for back pain.  Skin: Negative.   Neurological: Negative.   Endo/Heme/Allergies: Negative.   Psychiatric/Behavioral:         See HPI  Individual Medical History/ Review of Systems: Changes? :No    Past medications for mental health diagnoses include: Trileptal Lamictal Prozac Zoloft Effexor XR, Cymbalta, Paxil, lithium, Depakote, Latuda, Risperdal, Tegretol, Wellbutrin, propranolol for tremor, Thorazine  Haldol and Zyprexa given for a few days inpatient January 2023  Hospitalized in January 2023 at [facility] for altered mental status with hallucinations, paranoia, and aggressive behavior  Allergies: Atorvastatin, Codeine, Diclofenac, Doxycycline, and Sulfonamide derivatives  Current Medications:  Current Outpatient  Medications:    albuterol (VENTOLIN HFA) 108 (90 Base) MCG/ACT inhaler, Inhale 2 puffs into the lungs every 4 (four) hours as needed. For shortness of breath and wheezing, Disp: 18 g, Rfl: 3   blood glucose meter kit and supplies, Dispense based on patient and insurance preference. Use once a day.  DX Code: E11.9, Disp: 1 each, Rfl: 0   donepezil (ARICEPT) 5 MG tablet, Take 1 tablet (5 mg total) by mouth at bedtime., Disp: 30 tablet, Rfl: 2   DULoxetine (CYMBALTA) 60 MG capsule, Take 1 capsule (60 mg total) by mouth daily., Disp: 30 capsule, Rfl: 0   glucose blood test strip, Use as instructed once a day.  Dx Code: E11.9, Disp: 30 each, Rfl: 2   haloperidol (HALDOL) 1 MG tablet, Take 1 tablet (1 mg total) by mouth 2 (two) times daily at 8 am and 4 pm., Disp: 60 tablet, Rfl: 2   hydrocortisone-pramoxine (ANALPRAM HC) 2.5-1 % rectal cream, Place 1 application rectally 3 (three) times daily., Disp: 30 g, Rfl: 3   Lancets MISC, Use as directed once a day.  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 Remove & Discard patch within 12 hours or as directed by MD, Disp: 30 patch, Rfl: 0   LORazepam (ATIVAN) 1 MG tablet, Take 1 tablet (1 mg total) by mouth at bedtime., Disp: 30 tablet, Rfl: 0   meloxicam (MOBIC) 7.5 MG tablet, Take 1 tablet (7.5 mg total) by mouth daily., Disp: 30 tablet, Rfl: 2   metFORMIN (GLUCOPHAGE) 1000 MG tablet, Take 1 tablet (1,000 mg total) by mouth 2 (two) times daily with a meal., Disp: 60 tablet, Rfl: 2   ondansetron (ZOFRAN) 4 MG tablet, Take 1 tablet (4 mg total) by mouth every 8 (eight) hours as needed for nausea or vomiting., Disp: 15 tablet, Rfl: 0   prazosin (MINIPRESS) 1 MG capsule, Take 1 capsule (1 mg total) by mouth at bedtime. Take with the 2 mg=3 mg q hs., Disp: 30 capsule, Rfl: 2    prazosin (MINIPRESS) 2 MG capsule, Take 1 capsule (2 mg total) by mouth at bedtime., Disp: 30 capsule, Rfl: 2   propranolol (INDERAL) 20 MG tablet, Take 1 tablet (20 mg total) by mouth 2 (two) times daily with a meal., Disp: 60 tablet, Rfl: 2   QUEtiapine (SEROQUEL) 200 MG tablet, Take 1 tablet (200 mg total) by mouth at bedtime., Disp: 30 tablet, Rfl: 2 Medication Side Effects: none  Family Medical/ Social History: Changes? No  MENTAL HEALTH EXAM:  Blood pressure (!) 144/76, pulse (!) 54.There is no height or weight on file to calculate BMI.  General Appearance: Casual and Well Groomed  Eye Contact:  Good  Speech:  Clear and Coherent, Normal Rate, and Talkative  Volume:  Normal  Mood:  Angry  and Irritable  Affect:  Congruent  Thought Process:  Goal Directed and Descriptions of Associations: Circumstantial  Orientation:  Full (Time, Place, and Person)  Thought Content: Logical   Suicidal Thoughts:  No  Homicidal Thoughts:  No  Memory:  WNL  Judgement:  Good  Insight:  Good  Psychomotor Activity:  Normal and walks slowly.    Concentration:  Concentration: Good and Attention Span: Good  Recall:  Good  Fund of Knowledge: Good  Language: Good  Assets:  Desire for Improvement  ADL's:  Intact  Cognition: WNL  Prognosis:  Good    DIAGNOSES:    ICD-10-CM   1. Severe major depression with psychotic features ([place])  F32.3     2. Bipolar 2 disorder ([place])  F31.81     3. Generalized anxiety disorder  F41.1     4. Restless leg  G25.81     5. Disturbed cognition  R41.89     6. Hypothyroidism due to medication  E03.2     7. PTSD (post-traumatic stress disorder)  F43.10     8. Nightmares  F51.5       Receiving Psychotherapy: No    RECOMMENDATIONS:  PDMP reviewed.  Ativan filled 12/22/2021.  Xanax filled 11/27/2021. I provided 40 minutes of face to face time during this encounter, including time spent before and after the visit in records review, medical decision making,  counseling pertinent to today's visit, and charting.  I am glad to see her out of the hospital and doing better. Because of the nightmares I recommend increasing the prazosin.  Her blood pressure is okay and I am asked the family to check it randomly a few times a week to make sure it is not dropping.  If she feels dizzy or weaker, call. No other changes will be made at this point but will plan to wean off Haldol soon.  I do not want to make too many changes at once. Also hope to get her off the benzo completely.  She was on Tranxene prior to the hospitalization.  Continue Aricept 5 mg, 1 p.o. nightly. Continue Cymbalta 60 mg, 1 p.o. daily. Continue Haldol 1 mg, 1 p.o. twice daily. Continue Ativan 1 mg, 1 p.o. nightly. Increase prazosin to a total of 3 mg nightly. Continue propranolol 20 mg 1 p.o. twice daily.  (May be able to stop that as well as it was prescribed for lithium tremor and anxiety.) Continue Seroquel 200 mg, 1 p.o. nightly. Return in 2 weeks.   Donnal Moat, PA-C

## 2021-12-30 ENCOUNTER — Other Ambulatory Visit: Payer: Self-pay | Admitting: Physician Assistant

## 2022-01-01 ENCOUNTER — Telehealth: Payer: Self-pay | Admitting: Physician Assistant

## 2022-01-01 ENCOUNTER — Other Ambulatory Visit: Payer: Self-pay | Admitting: Family Medicine

## 2022-01-01 DIAGNOSIS — F03C18 Unspecified dementia, severe, with other behavioral disturbance: Secondary | ICD-10-CM

## 2022-01-01 NOTE — Telephone Encounter (Signed)
Called daughter and the recommendations were given.

## 2022-01-01 NOTE — Telephone Encounter (Signed)
See phone message from daughter. Called daughter to get more info, though you had just seen her Friday. Daughter said patient is "drunk" and slurring  her words at night. She said patient is sleepy during the day. She doesn't know if the sleepiness is due to her depression or related to medications. Daughter also reported patient is angrier and ornery. She said she is cognitively much clearer. Daughter is not giving patient all her meds and trying to stay on top of her behavior.

## 2022-01-01 NOTE — Telephone Encounter (Signed)
Megan Duncan called and said that she spent Saturday night and wanted Megan Duncan to know that with all of her night meds Megan Duncan is drunk and slurring her words. . She also wants to know is it ok for Megan Duncan to take 2 bayer asprin at night for aches and back  pain. Megan Duncan would like a call back to discuss what the next options are. Please call her at [phone]. She would like a call from Megan Duncan

## 2022-01-01 NOTE — Telephone Encounter (Signed)
Have her discontinue the Haldol 1 mg, 4 PM dose. The increased irritability could be partly due to not resting well because of the nightmares, the trauma that she has gone through with the recent hospitalization, and also the medication changes that occurred while in the hospital.  I do not want to make any other changes at this time, if we change too much at once, we're not going to know what medication the benefits or side effects are coming from.  Yes it is okay to take the aspirin.

## 2022-01-01 NOTE — Chronic Care Management (AMB) (Signed)
°  Care Management   Note  01/01/2022 Name: Megan Duncan MRN: [id number] DOB: November 04, 1946  Megan Duncan is a 76 y.o. year old female who is a primary care patient of Ann Held, DO and is actively engaged with the care management team. I reached out to [name] by phone today to assist with re-scheduling a follow up visit with the RN Case Manager  Follow up plan: Telephone appointment with care management team member scheduled for: 01/04/2022  Julian Hy, [place], [place] Management  Direct Dial: [phone]

## 2022-01-02 ENCOUNTER — Telehealth: Payer: Self-pay

## 2022-01-02 NOTE — Telephone Encounter (Signed)
Megan Duncan, daughter, called again today. The 4 PM Haldol was not given yesterday. Daughter said patient fell during the night and could not get up. She was hallucinating, was seeing people and stars everywhere, was terrified of her husband and daughter. Sx are better this morning, though not completely resolved. Megan Duncan said this was like she was before hospitalization.  Megan Duncan would like for you to call her [phone]. She said it was so frustrating not to be able to talk to the doctor. I explained that you were busy seeing patients and that normally I relay the information to you and you respond back.

## 2022-01-02 NOTE — Telephone Encounter (Signed)
I returned Megan Duncan's call.  We decreased the Haldol a few days ago, as she was stable mentally but complained of extreme fatigue.  Prazosin was increased from 2 mg to 3 mg nightly due to nightmares.  Last night she had another episode where she saw stars, thought her husband had slit her throat, seeing people that were not there, she was calling out for her husband and he found her on the bedroom floor.  It was unwitnessed so not sure if she fell, passed out, or what but it was a terrible night.  Had no complaints of dizziness or feeling faint since increasing the prazosin.  Patient knows she had an episode and is terrified that it will happen again, even commenting that she has had two psychotic episodes now.  Up until a month ago, she had never had anything like this.   For now I recommend increasing the Haldol back to 1 mg twice a day and decrease the prazosin back to 2 mg nightly.  We may need to increase the Seroquel as well but we will not make a change in that as of yet.  Megan Duncan will call back if there are any other problems.

## 2022-01-02 NOTE — Telephone Encounter (Signed)
Opened in error

## 2022-01-04 ENCOUNTER — Ambulatory Visit: Payer: Medicare Other

## 2022-01-04 DIAGNOSIS — F03C18 Unspecified dementia, severe, with other behavioral disturbance: Secondary | ICD-10-CM

## 2022-01-04 DIAGNOSIS — I10 Essential (primary) hypertension: Secondary | ICD-10-CM

## 2022-01-04 DIAGNOSIS — E1151 Type 2 diabetes mellitus with diabetic peripheral angiopathy without gangrene: Secondary | ICD-10-CM

## 2022-01-04 NOTE — Patient Instructions (Signed)
Visit Information  Thank you for taking time to visit with me today. Please don't hesitate to contact me if I can be of assistance to you before our next scheduled telephone appointment.  Following are the goals we discussed today:  Patient Goals/Self-Care Activities: Take medications as prescribed   Attend all scheduled provider appointments Begin to check blood sugar per provider recommendation Contact home health agency if they have not called you by end of the week  Our next appointment is by telephone on 01/25/22 at 3:00 pm  Please call the care guide team at [phone] if you need to cancel or reschedule your appointment.   If you are experiencing a Mental Health or [place] or need someone to talk to, please call the Suicide and Crisis Lifeline: 988 call 1-800-273-TALK (toll free, 24 hour hotline)   Patient verbalizes understanding of instructions and care plan provided today and agrees to view in [place]. Active MyChart status confirmed with patient.    Thea Silversmith, RN, MSN, BSN, CCM Care Management Coordinator [facility] [phone]

## 2022-01-04 NOTE — Chronic Care Management (AMB) (Signed)
Chronic Care Management   CCM RN Visit Note  01/04/2022 Name: Megan Duncan MRN: [id number] DOB: Nov 20, 1945  Subjective: Megan Duncan is a 76 y.o. year old female who is a primary care patient of Ann Held, DO. The care management team was consulted for assistance with disease management and care coordination needs.    Engaged with patient by telephone for follow up visit in response to provider referral for case management and/or care coordination services.   Consent to Services:  The patient was given information about Chronic Care Management services, agreed to services, and gave verbal consent prior to initiation of services.  Please see initial visit note for detailed documentation.   Patient agreed to services and verbal consent obtained.   Assessment: Review of patient past medical history, allergies, medications, health status, including review of consultants reports, laboratory and other test data, was performed as part of comprehensive evaluation and provision of chronic care management services.   SDOH (Social Determinants of Health) assessments and interventions performed:    CCM Care Plan  Allergies  Allergen Reactions   Atorvastatin Other (See Comments)    Significant rise in liver tests   Codeine Nausea And Vomiting   Diclofenac Other (See Comments)    Elevated LFT's   Doxycycline     Extreme nausea   Sulfonamide Derivatives Swelling    Outpatient Encounter Medications as of 01/04/2022  Medication Sig   albuterol (VENTOLIN HFA) 108 (90 Base) MCG/ACT inhaler Inhale 2 puffs into the lungs every 4 (four) hours as needed. For shortness of breath and wheezing   Aspirin-Caffeine (BAYER BACK & BODY) 500-32.5 MG TABS Take 1 tablet by mouth daily as needed. Reports takes 1-2 as needed   donepezil (ARICEPT) 5 MG tablet Take 1 tablet (5 mg total) by mouth at bedtime.   DULoxetine (CYMBALTA) 60 MG capsule Take 1 capsule (60 mg total) by mouth daily.    haloperidol (HALDOL) 1 MG tablet Take 1 tablet (1 mg total) by mouth 2 (two) times daily at 8 am and 4 pm.   hydrocortisone-pramoxine (ANALPRAM HC) 2.5-1 % rectal cream Place 1 application rectally 3 (three) times daily.   levothyroxine (SYNTHROID) 50 MCG tablet TAKE 1 TABLET BY MOUTH EVERY DAY BEFORE BREAKFAST   lidocaine (LIDODERM) 5 % Place 1 patch onto the skin daily as needed. Remove & Discard patch within 12 hours or as directed by MD   LORazepam (ATIVAN) 1 MG tablet Take 1 tablet (1 mg total) by mouth at bedtime.   meloxicam (MOBIC) 7.5 MG tablet Take 1 tablet (7.5 mg total) by mouth daily.   metFORMIN (GLUCOPHAGE) 1000 MG tablet Take 1 tablet (1,000 mg total) by mouth 2 (two) times daily with a meal.   ondansetron (ZOFRAN) 4 MG tablet Take 1 tablet (4 mg total) by mouth every 8 (eight) hours as needed for nausea or vomiting.   prazosin (MINIPRESS) 2 MG capsule Take 1 capsule (2 mg total) by mouth at bedtime.   propranolol (INDERAL) 20 MG tablet Take 1 tablet (20 mg total) by mouth 2 (two) times daily with a meal.   QUEtiapine (SEROQUEL) 200 MG tablet Take 1 tablet (200 mg total) by mouth at bedtime.   blood glucose meter kit and supplies Dispense based on patient and insurance preference. Use once a day.  DX Code: E11.9   glucose blood test strip Use as instructed once a day.  Dx Code: E11.9   Lancets MISC Use as directed once a day.  Dx code: E11.9   prazosin (MINIPRESS) 1 MG capsule Take 1 capsule (1 mg total) by mouth at bedtime. Take with the 2 mg=3 mg q hs. (Patient not taking: Reported on 01/04/2022)   No facility-administered encounter medications on file as of 01/04/2022.    Patient Active Problem List   Diagnosis Date Noted   Altered mental status 12/03/2021   Anxiety 11/28/2021   Memory loss 10/26/2021   Bilateral hearing loss 10/26/2021   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   Degenerative lumbar spinal stenosis    Anterolisthesis of lumbar  spine    Lump in upper inner quadrant of left breast 07/13/2021   Left flank pain 07/13/2021   Lumbar radiculopathy 01/26/2021   Body mass index (BMI) 36.0-36.9, adult 01/11/2021   Degeneration of lumbar intervertebral disc 08/22/2020   Retrolisthesis of vertebrae 08/22/2020   Foraminal stenosis of lumbar region 08/22/2020   Generalized anxiety disorder 09/01/2018   Type II diabetes mellitus 06/05/2018   Tinea corporis 09/01/2017   Left upper quadrant pain 09/01/2017   Estrogen deficiency 09/01/2017   Thoracic aortic atherosclerosis 05/14/2017   COPD (chronic obstructive pulmonary disease) 10/30/2016   Allergic rhinitis 10/03/2016   Palpitations 07/20/2016   DOE (dyspnea on exertion) 07/20/2016   External hemorrhoid 08/01/2015   Stress incontinence, female 08/01/2015   Dermatitis 06/02/2015   Shingles outbreak 04/14/2015   Hyperlipidemia associated with type 2 diabetes mellitus ([place])    Fibromyalgia 10/14/2014   Bilateral hip pain 09/02/2014   Deformity of finger 07/15/2014   Ceruminosis 03/04/2014   Tinnitus 03/04/2014   Torticollis 03/04/2014   Bipolar 2 disorder 12/03/2013   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-sided sciatica 08/06/2012   Lower abdominal pain 08/06/2012   Otitis media 09/05/2010   Peripheral neuropathy 02/22/2010   Lumbar back pain with radiculopathy affecting left lower extremity 01/24/2010   Cervicalgia 06/20/2007   Chronic low back pain 06/20/2007   Essential (primary) hypertension 04/23/2007   Inflammatory disease of uterus 04/23/2007    Conditions to be addressed/monitored:HTN, DMII, and Mental Health Concerns  Care Plan : RN Care Manager Plan of Care  Updates made by Luretha Rued, RN since 01/04/2022 12:00 AM     Problem: No plan of Care Established for Managment of [place] Disease   Priority: High     Long-Range Goal: Developement of Plan of Care for Chronic Disease Management and/or care coordination needs   Start Date: 11/07/2021   Expected End Date: 05/08/2022  Priority: High  Note:   Current Barriers: 76 year old with history of HTN; DM; bilateral hearing loss-upcoming appointment with audiologist; memory loss, Bipolar/Generalized Anxiety Disorder-managed by psychiatry. Recent behavior health admission (12/03/21-12/22/21)-medications adjusted. She has been home approximately a week. Mrs. Nutter reports a fall during a nighttime episode, but medications changed back to discharged medication regimen and patient reports no hallucinations in the past two nights-Denies injury. Daughter reports patient has had significant improvement in cognitive function since medication adjustment during hospitalization, but adds she sees Mrs. Feehan going back to staying in bed a lot.  Daughter states she has spoken with home health agency [facility] and expect a start of date call today.  Knowledge Deficits related to plan of care for management of HTN, DMII, and decreased mobility, chronic pain  Chronic Disease Management support and education needs related to HTN, DMII, and chronic pain Chronic low back pain limiting mobility Does not routinely check blood sugar levels Does not have blood pressure monitor Mental health  challenges(followed by Psychiatry)  RNCM Clinical Goal(s):  Patient will verbalize understanding of plan for management of HTN and DMII as evidenced by self report, caregiver report, chart notiation take all medications exactly as prescribed and will call provider for medication related questions as evidenced by sefl report, caregiver report, chart notation    demonstrate improved health management independence as evidenced by report of health management       through collaboration with RN Care manager, provider, and care team.   Interventions: 1:1 collaboration with primary care provider regarding development and update of comprehensive plan of care as evidenced by provider attestation and co-signature Inter-disciplinary care  team collaboration (see longitudinal plan of care) Evaluation of current treatment plan related to  self management and patient's adherence to plan as established by provider Positive feedback provided to patient regarding effort in management of her health  Community Resource Need Intervention:  (Status:  Goal on track:  Yes.)  Long Term Goal Evaluation of current treatment plan related to HTN and DMII, Mental Health Concerns  self-management and patient's adherence to plan as established by provider Encouraged to continue to work with LCSW and Care Guide for community resources as needed  Diabetes Interventions:  (Status:  Goal on track:  Yes.) Long Term Goal Assessed patient's understanding of A1c goal: <7% Reviewed medications with patient and discussed importance of medication adherence Discussed plans with patient for ongoing care management follow up and provided patient with direct contact information for care management team Encouraged to begin to check blood sugars as she continues to improve in cognitive function. Goal fasting blood sugar 80-130 and <180 after a meal Lab Results  Component Value Date   HGBA1C 6.4 10/27/2021  Hypertension Interventions:  (Status:  New goal.) Long Term Goal Last practice recorded BP readings:  BP Readings from Last 3 Encounters:  12/22/21 (!) 109/55  12/03/21 132/86  12/01/21 137/80  Most recent eGFR/CrCl: No results found for: EGFR  No components found for: CRCL  Evaluation of current treatment plan related to hypertension self management and patient's adherence to plan as established by provider Reviewed medications with patient and discussed importance of compliance    Pain Interventions:  (Status:  Goal on track:  Yes.) Long Term Goal Encouraged patient to complete exercises provided by home health as recommended once they begin therapy Medications reviewed. Encouraged to take as prescribed Discussed non pharmacological strategies such as  heat, rest, stretching, exercise Reviewed provider established plan for pain management Discussed importance of adherence to all scheduled medical appointments Discussed importance of activity/exercise in maintaining good muscle tone and strength with the management of pain   Reviewed scheduled/upcoming provider appointments including   Patient Goals/Self-Care Activities: Take medications as prescribed   Attend all scheduled provider appointments Begin to check blood sugar per provider recommendation Contact home health agency if they have not called you by end of the week   Plan:Telephone follow up appointment with care management team member scheduled for:  01/25/22 The patient has been provided with contact information for the care management team and has been advised to call with any health related questions or concerns.   Thea Silversmith, RN, MSN, BSN, CCM Care Management Coordinator [facility] [phone]

## 2022-01-05 ENCOUNTER — Ambulatory Visit: Payer: Medicare Other | Admitting: Physician Assistant

## 2022-01-05 ENCOUNTER — Ambulatory Visit: Payer: Medicare Other | Admitting: *Deleted

## 2022-01-05 DIAGNOSIS — L728 Other follicular cysts of the skin and subcutaneous tissue: Secondary | ICD-10-CM | POA: Diagnosis not present

## 2022-01-05 DIAGNOSIS — M797 Fibromyalgia: Secondary | ICD-10-CM | POA: Diagnosis not present

## 2022-01-05 DIAGNOSIS — F3181 Bipolar II disorder: Secondary | ICD-10-CM

## 2022-01-05 DIAGNOSIS — I10 Essential (primary) hypertension: Secondary | ICD-10-CM

## 2022-01-05 DIAGNOSIS — M19042 Primary osteoarthritis, left hand: Secondary | ICD-10-CM | POA: Diagnosis not present

## 2022-01-05 DIAGNOSIS — J449 Chronic obstructive pulmonary disease, unspecified: Secondary | ICD-10-CM

## 2022-01-05 DIAGNOSIS — G609 Hereditary and idiopathic neuropathy, unspecified: Secondary | ICD-10-CM

## 2022-01-05 DIAGNOSIS — M4316 Spondylolisthesis, lumbar region: Secondary | ICD-10-CM | POA: Diagnosis not present

## 2022-01-05 DIAGNOSIS — J309 Allergic rhinitis, unspecified: Secondary | ICD-10-CM | POA: Diagnosis not present

## 2022-01-05 DIAGNOSIS — N644 Mastodynia: Secondary | ICD-10-CM | POA: Diagnosis not present

## 2022-01-05 DIAGNOSIS — E1169 Type 2 diabetes mellitus with other specified complication: Secondary | ICD-10-CM | POA: Diagnosis not present

## 2022-01-05 DIAGNOSIS — F03C18 Unspecified dementia, severe, with other behavioral disturbance: Secondary | ICD-10-CM

## 2022-01-05 DIAGNOSIS — F0280 Dementia in other diseases classified elsewhere without behavioral disturbance: Secondary | ICD-10-CM | POA: Diagnosis not present

## 2022-01-05 DIAGNOSIS — L309 Dermatitis, unspecified: Secondary | ICD-10-CM | POA: Diagnosis not present

## 2022-01-05 DIAGNOSIS — Z7984 Long term (current) use of oral hypoglycemic drugs: Secondary | ICD-10-CM | POA: Diagnosis not present

## 2022-01-05 DIAGNOSIS — F0670 Mild neurocognitive disorder due to known physiological condition without behavioral disturbance: Secondary | ICD-10-CM

## 2022-01-05 DIAGNOSIS — K5790 Diverticulosis of intestine, part unspecified, without perforation or abscess without bleeding: Secondary | ICD-10-CM | POA: Diagnosis not present

## 2022-01-05 DIAGNOSIS — M19041 Primary osteoarthritis, right hand: Secondary | ICD-10-CM | POA: Diagnosis not present

## 2022-01-05 DIAGNOSIS — M48061 Spinal stenosis, lumbar region without neurogenic claudication: Secondary | ICD-10-CM | POA: Diagnosis not present

## 2022-01-05 DIAGNOSIS — F411 Generalized anxiety disorder: Secondary | ICD-10-CM | POA: Diagnosis not present

## 2022-01-05 DIAGNOSIS — M5116 Intervertebral disc disorders with radiculopathy, lumbar region: Secondary | ICD-10-CM | POA: Diagnosis not present

## 2022-01-05 DIAGNOSIS — E1151 Type 2 diabetes mellitus with diabetic peripheral angiopathy without gangrene: Secondary | ICD-10-CM

## 2022-01-05 DIAGNOSIS — E1142 Type 2 diabetes mellitus with diabetic polyneuropathy: Secondary | ICD-10-CM | POA: Diagnosis not present

## 2022-01-05 DIAGNOSIS — E785 Hyperlipidemia, unspecified: Secondary | ICD-10-CM

## 2022-01-05 DIAGNOSIS — E039 Hypothyroidism, unspecified: Secondary | ICD-10-CM | POA: Diagnosis not present

## 2022-01-05 DIAGNOSIS — Z87891 Personal history of nicotine dependence: Secondary | ICD-10-CM | POA: Diagnosis not present

## 2022-01-05 DIAGNOSIS — G8929 Other chronic pain: Secondary | ICD-10-CM | POA: Diagnosis not present

## 2022-01-05 DIAGNOSIS — I700 Atherosclerosis of aorta: Secondary | ICD-10-CM | POA: Diagnosis not present

## 2022-01-05 DIAGNOSIS — M5432 Sciatica, left side: Secondary | ICD-10-CM | POA: Diagnosis not present

## 2022-01-05 NOTE — Patient Instructions (Addendum)
Visit Information  Thank you for taking time to visit with me today. Please don't hesitate to contact me if I can be of assistance to you before our next scheduled telephone appointment.  Following are the goals we discussed today:  Patient Goals/Self-Care Activities:  Continue to receive bi-weekly social work services from [employer], in an effort to coordinate hours of in-home care services through long-term care insurance policy, as well as home health care services. Collaboration with Sharmon Revere, Home Health Nurse with [facility] [phone]), to confirm receipt of orders for Home Health Physical Therapy, Occupational Therapy, and Bath Aide. Collaboration with Sharmon Revere, [place] Nurse with [facility], to confirm arrangement of [place] through a personal care service provider, if daughter is agreeable, as [facility] does not currently have bath aides available. Sharmon Revere, Home Health Nurse with [facility], agreed to contact daughter directly to explain arrangement of home health care services. Collaboration with Lennette Bihari, [place] Physical Therapist with [facility], to confirm start of care date for 01/05/2022. Keep appointment with Donnal Moat, Psychiatric Physician Assistant - Certified with [place] [phone]), scheduled on 01/11/2022 at 3:30 pm. Contact LCSW directly (# [phone]), if you have questions, need assistance, or if additional social work needs are identified between now and our next scheduled telephone outreach call.  Follow-Up Plan:  01/19/2022 at 2:30 pm   Please call the care guide team at [phone] if you need to cancel or reschedule your appointment.   If you are experiencing a Mental Health or [place] or need someone to talk to, please call the Suicide and Crisis Lifeline: 988 call the [place] National Suicide Prevention Lifeline: (802) 674-1204 or TTY: 229-725-1272 TTY  986-699-6775) to talk to a trained counselor call 1-800-273-TALK (toll free, 24 hour hotline) go to [facility] [address] [place] [phone]) call the [place]: [phone] call 911   Patient verbalizes understanding of instructions and care plan provided today and agrees to view in [place]. Active MyChart status confirmed with patient.    [place] Licensed Clinical Social Worker [facility] [employer] [facility] [phone]

## 2022-01-05 NOTE — Chronic Care Management (AMB) (Signed)
Chronic Care Management    Clinical Social Work Note  01/05/2022 Name: Megan Duncan MRN: [id number] DOB: 05-Oct-1946  Lanelle Bal Franklyn is a 76 y.o. year old female who is a primary care patient of Ann Held, DO. The CCM team was consulted to assist the patient with chronic disease management and/or care coordination needs related to:  Appointment Scheduling Needs, Community Resources, Level of Care Concerns, Mental Health Counseling and Resources, and Caregiver Stress.   Engaged with patient's daughter by telephone for follow up visit in response to provider referral for social work chronic care management and care coordination services.   Consent to Services:  The patient was given information about Chronic Care Management services, agreed to services, and gave verbal consent prior to initiation of services.  Please see initial visit note for detailed documentation.   Patient agreed to services and consent obtained.   Assessment: Review of patient past medical history, allergies, medications, and health status, including review of relevant consultants reports was performed today as part of a comprehensive evaluation and provision of chronic care management and care coordination services.     SDOH (Social Determinants of Health) assessments and interventions performed:    Advanced Directives Status: Not addressed in this encounter.  CCM Care Plan  Allergies  Allergen Reactions   Atorvastatin Other (See Comments)    Significant rise in liver tests   Codeine Nausea And Vomiting   Diclofenac Other (See Comments)    Elevated LFT's   Doxycycline     Extreme nausea   Sulfonamide Derivatives Swelling    Outpatient Encounter Medications as of 01/05/2022  Medication Sig   albuterol (VENTOLIN HFA) 108 (90 Base) MCG/ACT inhaler Inhale 2 puffs into the lungs every 4 (four) hours as needed. For shortness of breath and wheezing   Aspirin-Caffeine (BAYER BACK & BODY) 500-32.5  MG TABS Take 1 tablet by mouth daily as needed. Reports takes 1-2 as needed   blood glucose meter kit and supplies Dispense based on patient and insurance preference. Use once a day.  DX Code: E11.9   donepezil (ARICEPT) 5 MG tablet Take 1 tablet (5 mg total) by mouth at bedtime.   DULoxetine (CYMBALTA) 60 MG capsule Take 1 capsule (60 mg total) by mouth daily.   glucose blood test strip Use as instructed once a day.  Dx Code: E11.9   haloperidol (HALDOL) 1 MG tablet Take 1 tablet (1 mg total) by mouth 2 (two) times daily at 8 am and 4 pm.   hydrocortisone-pramoxine (ANALPRAM HC) 2.5-1 % rectal cream Place 1 application rectally 3 (three) times daily.   Lancets MISC Use as directed once a day.  Dx code: E11.9   levothyroxine (SYNTHROID) 50 MCG tablet TAKE 1 TABLET BY MOUTH EVERY DAY BEFORE BREAKFAST   lidocaine (LIDODERM) 5 % Place 1 patch onto the skin daily as needed. Remove & Discard patch within 12 hours or as directed by MD   LORazepam (ATIVAN) 1 MG tablet Take 1 tablet (1 mg total) by mouth at bedtime.   meloxicam (MOBIC) 7.5 MG tablet Take 1 tablet (7.5 mg total) by mouth daily.   metFORMIN (GLUCOPHAGE) 1000 MG tablet Take 1 tablet (1,000 mg total) by mouth 2 (two) times daily with a meal.   ondansetron (ZOFRAN) 4 MG tablet Take 1 tablet (4 mg total) by mouth every 8 (eight) hours as needed for nausea or vomiting.   prazosin (MINIPRESS) 1 MG capsule Take 1 capsule (1 mg total) by mouth  at bedtime. Take with the 2 mg=3 mg q hs. (Patient not taking: Reported on 01/04/2022)   prazosin (MINIPRESS) 2 MG capsule Take 1 capsule (2 mg total) by mouth at bedtime.   propranolol (INDERAL) 20 MG tablet Take 1 tablet (20 mg total) by mouth 2 (two) times daily with a meal.   QUEtiapine (SEROQUEL) 200 MG tablet Take 1 tablet (200 mg total) by mouth at bedtime.   No facility-administered encounter medications on file as of 01/05/2022.    Patient Active Problem List   Diagnosis Date Noted   Altered  mental status 12/03/2021   Anxiety 11/28/2021   Memory loss 10/26/2021   Bilateral hearing loss 10/26/2021   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   Degenerative lumbar spinal stenosis    Anterolisthesis of lumbar spine    Lump in upper inner quadrant of left breast 07/13/2021   Left flank pain 07/13/2021   Lumbar radiculopathy 01/26/2021   Body mass index (BMI) 36.0-36.9, adult 01/11/2021   Degeneration of lumbar intervertebral disc 08/22/2020   Retrolisthesis of vertebrae 08/22/2020   Foraminal stenosis of lumbar region 08/22/2020   Generalized anxiety disorder 09/01/2018   Type II diabetes mellitus 06/05/2018   Tinea corporis 09/01/2017   Left upper quadrant pain 09/01/2017   Estrogen deficiency 09/01/2017   Thoracic aortic atherosclerosis 05/14/2017   COPD (chronic obstructive pulmonary disease) 10/30/2016   Allergic rhinitis 10/03/2016   Palpitations 07/20/2016   DOE (dyspnea on exertion) 07/20/2016   External hemorrhoid 08/01/2015   Stress incontinence, female 08/01/2015   Dermatitis 06/02/2015   Shingles outbreak 04/14/2015   Hyperlipidemia associated with type 2 diabetes mellitus ([place])    Fibromyalgia 10/14/2014   Bilateral hip pain 09/02/2014   Deformity of finger 07/15/2014   Ceruminosis 03/04/2014   Tinnitus 03/04/2014   Torticollis 03/04/2014   Bipolar 2 disorder 12/03/2013   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-sided sciatica 08/06/2012   Lower abdominal pain 08/06/2012   Otitis media 09/05/2010   Peripheral neuropathy 02/22/2010   Lumbar back pain with radiculopathy affecting left lower extremity 01/24/2010   Cervicalgia 06/20/2007   Chronic low back pain 06/20/2007   Essential (primary) hypertension 04/23/2007   Inflammatory disease of uterus 04/23/2007    Conditions to be addressed/monitored: Anxiety, Depression, and Bipolar Disorder.  Level of Care Concerns, ADL/IADL Limitations, Mental Health Concerns, Limited Access to  Caregiver, Cognitive Deficits, Memory Deficits, and Lacks Knowledge of [employer].  Care Plan : LCSW Plan of Care  Updates made by Francis Gaines, LCSW since 01/05/2022 12:00 AM     Problem: Improve Quality of Life through [facility].   Priority: High     Goal: Improve Quality of Life through [facility].   Start Date: 11/27/2021  Expected End Date: 01/25/2022  This Visit's Progress: On track  Recent Progress: On track  Priority: High  Note:   Current Barriers:   Patient with Thoracic Aortic Atherosclerosis, Essential Hypertension, Chronic Obstructive Pulmonary Disease, Type II Diabetes Mellitus, Mild Neurocognitive Disorder, Bilateral Hearing Loss, Memory Loss, Fibromyalgia, Bipolar II Disorder, Generalized Anxiety Disorder, and Caregiver Stress needs Support, Education, Resources, Referrals, Advocacy, and Care Coordination to resolve unmet personal care needs in the home. Patient is unable to self-administer medications as prescribed, or consistently perform ADL's/IADL's independently. Lacks knowledge of available community agencies and resources. Clinical Goals:  Patient, husband, and daughter will work with LCSW, in an effort to coordinate in-home care services, through [employer].   Patient  will demonstrate improved health management independence, as evidenced by having in-home care services in place. Interventions: Collaboration with Primary Care Physician, Dr. Roma Schanz, regarding development and update of comprehensive plan of care, as evidenced by provider attestation and co-signature. Collaboration with Primary Care Physician, Dr. Roma Schanz, to report patient and daughter's request for patient to resume aquatic therapy, and to obtain medical clearance. Inter-disciplinary care team collaboration (see longitudinal plan of care). Clinical Interventions: Problem Solving/Task Centered Solutions Developed. Quality  of Sleep Assessed and Sleep Hygiene Techniques Promoted. Caregiver Stress Acknowledged. Consideration of In-Home Care Services Encouraged. Emotional Support Provided and Verbalization of Feelings Discussed. Patient Goals/Self-Care Activities:  Continue to receive bi-weekly social work services from [employer], in an effort to coordinate hours of in-home care services through long-term care insurance policy, as well as home health care services. Collaboration with Sharmon Revere, Home Health Nurse with [facility] [phone]), to confirm receipt of orders for Home Health Physical Therapy, Occupational Therapy, and Bath Aide. Collaboration with Sharmon Revere, [place] Nurse with [facility], to confirm arrangement of [place] through a personal care service provider, if daughter is agreeable, as [facility] does not currently have bath aides available. Sharmon Revere, Home Health Nurse with [facility], agreed to contact daughter directly to explain arrangement of home health care services. Collaboration with Lennette Bihari, [place] Physical Therapist with [facility], to confirm start of care date for 01/05/2022. Keep appointment with Donnal Moat, Psychiatric Physician Assistant - Certified with [place] [phone]), scheduled on 01/11/2022 at 3:30 pm. Contact LCSW directly (# [phone]), if you have questions, need assistance, or if additional social work needs are identified between now and our next scheduled telephone outreach call.  Follow-Up Plan:  01/19/2022 at 2:30 pm       Nat Christen H[place] Clinical Social Worker [place] [phone]

## 2022-01-09 DIAGNOSIS — J449 Chronic obstructive pulmonary disease, unspecified: Secondary | ICD-10-CM | POA: Diagnosis not present

## 2022-01-09 DIAGNOSIS — F03C18 Unspecified dementia, severe, with other behavioral disturbance: Secondary | ICD-10-CM | POA: Diagnosis not present

## 2022-01-09 DIAGNOSIS — M5116 Intervertebral disc disorders with radiculopathy, lumbar region: Secondary | ICD-10-CM | POA: Diagnosis not present

## 2022-01-09 DIAGNOSIS — M4316 Spondylolisthesis, lumbar region: Secondary | ICD-10-CM | POA: Diagnosis not present

## 2022-01-09 DIAGNOSIS — I10 Essential (primary) hypertension: Secondary | ICD-10-CM

## 2022-01-09 DIAGNOSIS — M5432 Sciatica, left side: Secondary | ICD-10-CM | POA: Diagnosis not present

## 2022-01-09 DIAGNOSIS — G8929 Other chronic pain: Secondary | ICD-10-CM | POA: Diagnosis not present

## 2022-01-09 DIAGNOSIS — M48061 Spinal stenosis, lumbar region without neurogenic claudication: Secondary | ICD-10-CM | POA: Diagnosis not present

## 2022-01-09 DIAGNOSIS — F3181 Bipolar II disorder: Secondary | ICD-10-CM | POA: Diagnosis not present

## 2022-01-09 DIAGNOSIS — E785 Hyperlipidemia, unspecified: Secondary | ICD-10-CM

## 2022-01-09 DIAGNOSIS — E1151 Type 2 diabetes mellitus with diabetic peripheral angiopathy without gangrene: Secondary | ICD-10-CM | POA: Diagnosis not present

## 2022-01-09 DIAGNOSIS — E1169 Type 2 diabetes mellitus with other specified complication: Secondary | ICD-10-CM | POA: Diagnosis not present

## 2022-01-09 DIAGNOSIS — L728 Other follicular cysts of the skin and subcutaneous tissue: Secondary | ICD-10-CM | POA: Diagnosis not present

## 2022-01-11 ENCOUNTER — Ambulatory Visit (INDEPENDENT_AMBULATORY_CARE_PROVIDER_SITE_OTHER): Payer: Medicare Other | Admitting: Physician Assistant

## 2022-01-11 ENCOUNTER — Other Ambulatory Visit: Payer: Self-pay

## 2022-01-11 ENCOUNTER — Encounter: Payer: Self-pay | Admitting: Physician Assistant

## 2022-01-11 DIAGNOSIS — G2581 Restless legs syndrome: Secondary | ICD-10-CM | POA: Diagnosis not present

## 2022-01-11 DIAGNOSIS — F4310 Post-traumatic stress disorder, unspecified: Secondary | ICD-10-CM | POA: Diagnosis not present

## 2022-01-11 DIAGNOSIS — H9190 Unspecified hearing loss, unspecified ear: Secondary | ICD-10-CM

## 2022-01-11 DIAGNOSIS — F515 Nightmare disorder: Secondary | ICD-10-CM

## 2022-01-11 DIAGNOSIS — R413 Other amnesia: Secondary | ICD-10-CM | POA: Diagnosis not present

## 2022-01-11 DIAGNOSIS — F411 Generalized anxiety disorder: Secondary | ICD-10-CM

## 2022-01-11 DIAGNOSIS — R4189 Other symptoms and signs involving cognitive functions and awareness: Secondary | ICD-10-CM

## 2022-01-11 DIAGNOSIS — E032 Hypothyroidism due to medicaments and other exogenous substances: Secondary | ICD-10-CM

## 2022-01-11 DIAGNOSIS — F323 Major depressive disorder, single episode, severe with psychotic features: Secondary | ICD-10-CM

## 2022-01-11 MED ORDER — LORAZEPAM 1 MG PO TABS: 1.0000 mg | ORAL_TABLET | Freq: Every day | ORAL | 2 refills | Status: DC

## 2022-01-11 MED ORDER — DULOXETINE HCL 60 MG PO CPEP: 60.0000 mg | ORAL_CAPSULE | Freq: Every day | ORAL | 2 refills | Status: DC

## 2022-01-11 NOTE — Progress Notes (Signed)
[facility]  Patient ID: Megan Duncan,  MRN: [id number]  PCP: Ann Held, DO  Date of Evaluation: 01/11/2022  time spent:40 minutes  Chief Complaint:  Chief Complaint   Follow-up     HISTORY/CURRENT STATUS: HPI For 2 week follow-up. Accompanied by daughter [name].  Had a bad episode after stopping just one dose of Haldol, see note from 01/01/2022.  It was decreased because of somnolence, slurring her words and acting "drunk."  The night that the 4 PM Haldol dose was held, she started hallucinating, saw celebrities, other people that were not there and was terrified of her husband and her daughter that they were going to hurt her.  It is not clear if she stood up and passed out causing a fall or if she tripped and fell or whether she actually fell or not.  Her husband found her on the floor of the bedroom confused.  She was not physically hurt, he was not able to get her up so he made her as comfortable as possible and the floor until she eventually got up on her own and he found her back in the bed later on.  After the next day when she had the Haldol twice daily as was prescribed when she was discharged from the hospital in January, she is back to her normal mentality.   Still does not really enjoy anything, but blames that on the chronic back pain that she has had for over 2 years.  [name] reports that she had been pretty much bedbound all this time, "by her choice" and she feels like she has been depressed, and perhaps not telling me the full picture.  Energy and motivation are low, sleeps okay, still probably too much.  Not crying easily.  Personal hygiene is fair.  Does require a lot of help with ADLs.  No suicidal or homicidal thoughts.  Since that 1 episode she has not had any psychotic symptoms.  No increased energy with decreased need for sleep.  No reports of increased spending, grandiosity or other symptoms of mania.  No delusions, paranoia, or  hallucinations.  Review of Systems  Constitutional:  Positive for malaise/fatigue.  HENT: Negative.    Eyes: Negative.   Respiratory: Negative.    Cardiovascular: Negative.   Gastrointestinal: Negative.   Genitourinary: Negative.   Musculoskeletal:  Positive for back pain.  Skin: Negative.   Neurological: Negative.   Endo/Heme/Allergies: Negative.   Psychiatric/Behavioral:         See HPI.   Individual Medical History/ Review of Systems: Changes? :No    Past medications for mental health diagnoses include: Trileptal Lamictal Prozac Zoloft Effexor XR, Cymbalta, Paxil, lithium, Depakote, Latuda, Risperdal, Tegretol, Wellbutrin, propranolol for tremor, Thorazine  Haldol and Zyprexa given for a few days inpatient January 2023  Hospitalized in January 2023 at [facility] for altered mental status with hallucinations, paranoia, and aggressive behavior  Allergies: Atorvastatin, Codeine, Diclofenac, Doxycycline, and Sulfonamide derivatives  Current Medications:  Current Outpatient Medications:    albuterol (VENTOLIN HFA) 108 (90 Base) MCG/ACT inhaler, Inhale 2 puffs into the lungs every 4 (four) hours as needed. For shortness of breath and wheezing, Disp: 18 g, Rfl: 3   Aspirin-Caffeine (BAYER BACK & BODY) 500-32.5 MG TABS, Take 1 tablet by mouth daily as needed. Reports takes 1-2 as needed, Disp: , Rfl:    blood glucose meter kit and supplies, Dispense based on patient and insurance preference. Use once a day.  DX  Code: E11.9, Disp: 1 each, Rfl: 0   donepezil (ARICEPT) 5 MG tablet, Take 1 tablet (5 mg total) by mouth at bedtime., Disp: 30 tablet, Rfl: 2   glucose blood test strip, Use as instructed once a day.  Dx Code: E11.9, Disp: 30 each, Rfl: 2   haloperidol (HALDOL) 1 MG tablet, Take 1 tablet (1 mg total) by mouth 2 (two) times daily at 8 am and 4 pm., Disp: 60 tablet, Rfl: 2   hydrocortisone-pramoxine (ANALPRAM HC) 2.5-1 % rectal cream, Place 1 application  rectally 3 (three) times daily., Disp: 30 g, Rfl: 3   Lancets MISC, Use as directed once a day.  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 Remove & Discard patch within 12 hours or as directed by MD, Disp: 30 patch, Rfl: 0   metFORMIN (GLUCOPHAGE) 1000 MG tablet, Take 1 tablet (1,000 mg total) by mouth 2 (two) times daily with a meal., Disp: 60 tablet, Rfl: 2   ondansetron (ZOFRAN) 4 MG tablet, Take 1 tablet (4 mg total) by mouth every 8 (eight) hours as needed for nausea or vomiting., Disp: 15 tablet, Rfl: 0   prazosin (MINIPRESS) 2 MG capsule, Take 1 capsule (2 mg total) by mouth at bedtime., Disp: 30 capsule, Rfl: 2   QUEtiapine (SEROQUEL) 200 MG tablet, Take 1 tablet (200 mg total) by mouth at bedtime., Disp: 30 tablet, Rfl: 2   DULoxetine (CYMBALTA) 60 MG capsule, Take 1 capsule (60 mg total) by mouth daily., Disp: 30 capsule, Rfl: 2   LORazepam (ATIVAN) 1 MG tablet, Take 1 tablet (1 mg total) by mouth at bedtime., Disp: 30 tablet, Rfl: 2   meloxicam (MOBIC) 7.5 MG tablet, Take 1 tablet (7.5 mg total) by mouth daily. (Patient not taking: Reported on 01/11/2022), Disp: 30 tablet, Rfl: 2   propranolol (INDERAL) 20 MG tablet, Take 1 tablet (20 mg total) by mouth 2 (two) times daily with a meal., Disp: 60 tablet, Rfl: 2 Medication Side Effects: none  Family Medical/ Social History: Changes? No  MENTAL HEALTH EXAM:  There were no vitals taken for this visit.There is no height or weight on file to calculate BMI.  General Appearance: Casual and Well Groomed  Eye Contact:  Good  Speech:  Clear and Coherent, Normal Rate, and Talkative  Volume:  Normal  Mood:  Euthymic  Affect:  Congruent  Thought Process:  Goal Directed and Descriptions of Associations: Circumstantial  Orientation:  Full (Time, Place, and Person)   Thought Content: Logical   Suicidal Thoughts:  No  Homicidal Thoughts:  No  Memory:  WNL  Judgement:  Good  Insight:  Good  Psychomotor Activity:  Normal and walks slowly.    Concentration:  Concentration: Good and Attention Span: Good  Recall:  Good  Fund of Knowledge: Good  Language: Good  Assets:  Desire for Improvement  ADL's:  Intact  Cognition: WNL  Prognosis:  Good    DIAGNOSES:    ICD-10-CM   1. Severe major depression with psychotic features ([place])  F32.3     2. Generalized anxiety disorder  F41.1     3. Disturbed cognition  R41.89     4. PTSD (post-traumatic stress disorder)  F43.10     5. Nightmares  F51.5     6. Memory  loss  R41.3     7. Restless leg  G25.81     8. Hypothyroidism due to medication  E03.2     9. Hearing loss, unspecified hearing loss type, unspecified laterality  H91.90        Receiving Psychotherapy: No    RECOMMENDATIONS:  PDMP reviewed.  Ativan filled 12/22/2021.  Xanax filled 11/27/2021. I provided 40 minutes of face to face time during this encounter, including time spent before and after the visit in records review, medical decision making, counseling pertinent to today's visit, and charting.  It is difficult to know for sure why she was lying on the floor.  It could have been orthostatic hypotension after the increase of prazosin, which we did decrease back to the dose she was on after hospitalization.  She has had no dizziness or symptoms of orthostasis so we will continue at that dose.  I think the sudden psychotic symptoms likely occurred because of holding the 1 dose of Haldol, so we agree to make no medication changes at this point.  The benefits are far outweighing any side effects of drowsiness at this point.  Patient, her daughter and I are all in agreement.  Continue Aricept 5 mg, 1 p.o. nightly. Continue Cymbalta 60 mg, 1 p.o. daily. Continue Haldol 1 mg, 1 p.o. twice daily. Continue Ativan 1 mg, 1 p.o. nightly. Continue  prazosin 2 mg mg nightly. Continue propranolol 20 mg 1 p.o. twice daily.  (May be able to stop,as it was prescribed for lithium tremor and anxiety as inpatient.) Continue Seroquel 200 mg, 1 p.o. nightly. Return in 4 weeks.  Donnal Moat, PA-C

## 2022-01-16 ENCOUNTER — Telehealth: Payer: Self-pay | Admitting: Family Medicine

## 2022-01-16 DIAGNOSIS — M5116 Intervertebral disc disorders with radiculopathy, lumbar region: Secondary | ICD-10-CM | POA: Diagnosis not present

## 2022-01-16 DIAGNOSIS — M4316 Spondylolisthesis, lumbar region: Secondary | ICD-10-CM | POA: Diagnosis not present

## 2022-01-16 DIAGNOSIS — L728 Other follicular cysts of the skin and subcutaneous tissue: Secondary | ICD-10-CM | POA: Diagnosis not present

## 2022-01-16 DIAGNOSIS — M48061 Spinal stenosis, lumbar region without neurogenic claudication: Secondary | ICD-10-CM | POA: Diagnosis not present

## 2022-01-16 DIAGNOSIS — M5432 Sciatica, left side: Secondary | ICD-10-CM | POA: Diagnosis not present

## 2022-01-16 DIAGNOSIS — G8929 Other chronic pain: Secondary | ICD-10-CM | POA: Diagnosis not present

## 2022-01-16 NOTE — Telephone Encounter (Signed)
Demetria from [facility] called to confirm some diagnosis for the patient. She would like a call back at [phone]. Please advise.  ?

## 2022-01-17 NOTE — Telephone Encounter (Signed)
Returned call. Confirmed Dx and Levothyroxine dosage  ?

## 2022-01-19 ENCOUNTER — Ambulatory Visit (INDEPENDENT_AMBULATORY_CARE_PROVIDER_SITE_OTHER): Payer: Medicare Other | Admitting: *Deleted

## 2022-01-19 DIAGNOSIS — M797 Fibromyalgia: Secondary | ICD-10-CM

## 2022-01-19 DIAGNOSIS — E1169 Type 2 diabetes mellitus with other specified complication: Secondary | ICD-10-CM

## 2022-01-19 DIAGNOSIS — G609 Hereditary and idiopathic neuropathy, unspecified: Secondary | ICD-10-CM

## 2022-01-19 DIAGNOSIS — F0670 Mild neurocognitive disorder due to known physiological condition without behavioral disturbance: Secondary | ICD-10-CM

## 2022-01-19 DIAGNOSIS — F03C18 Unspecified dementia, severe, with other behavioral disturbance: Secondary | ICD-10-CM

## 2022-01-19 DIAGNOSIS — F3181 Bipolar II disorder: Secondary | ICD-10-CM

## 2022-01-19 NOTE — Chronic Care Management (AMB) (Signed)
Chronic Care Management    Clinical Social Work Note  01/19/2022 Name: Megan Duncan MRN: [id number] DOB: 30-Jun-1946  Megan Duncan is a 76 y.o. year old female who is a primary care patient of Ann Held, DO. The CCM team was consulted to assist the patient with chronic disease management and/or care coordination needs related to: [employer], Level of Care Concerns, Mental Health Counseling and Resources, and Caregiver Stress.   Engaged with patient's daughter by telephone for follow up visit in response to provider referral for social work chronic care management and care coordination services.   Consent to Services:  The patient was given information about Chronic Care Management services, agreed to services, and gave verbal consent prior to initiation of services.  Please see initial visit note for detailed documentation.   Patient agreed to services and consent obtained.   Assessment: Review of patient past medical history, allergies, medications, and health status, including review of relevant consultants reports was performed today as part of a comprehensive evaluation and provision of chronic care management and care coordination services.     SDOH (Social Determinants of Health) assessments and interventions performed:    Advanced Directives Status: Not addressed in this encounter.  CCM Care Plan  Allergies  Allergen Reactions   Atorvastatin Other (See Comments)    Significant rise in liver tests   Codeine Nausea And Vomiting   Diclofenac Other (See Comments)    Elevated LFT's   Doxycycline     Extreme nausea   Sulfonamide Derivatives Swelling    Outpatient Encounter Medications as of 01/19/2022  Medication Sig   albuterol (VENTOLIN HFA) 108 (90 Base) MCG/ACT inhaler Inhale 2 puffs into the lungs every 4 (four) hours as needed. For shortness of breath and wheezing   Aspirin-Caffeine (BAYER BACK & BODY) 500-32.5 MG TABS Take 1 tablet by mouth  daily as needed. Reports takes 1-2 as needed   blood glucose meter kit and supplies Dispense based on patient and insurance preference. Use once a day.  DX Code: E11.9   donepezil (ARICEPT) 5 MG tablet Take 1 tablet (5 mg total) by mouth at bedtime.   DULoxetine (CYMBALTA) 60 MG capsule Take 1 capsule (60 mg total) by mouth daily.   glucose blood test strip Use as instructed once a day.  Dx Code: E11.9   haloperidol (HALDOL) 1 MG tablet Take 1 tablet (1 mg total) by mouth 2 (two) times daily at 8 am and 4 pm.   hydrocortisone-pramoxine (ANALPRAM HC) 2.5-1 % rectal cream Place 1 application rectally 3 (three) times daily.   Lancets MISC Use as directed once a day.  Dx code: E11.9   levothyroxine (SYNTHROID) 50 MCG tablet TAKE 1 TABLET BY MOUTH EVERY DAY BEFORE BREAKFAST   lidocaine (LIDODERM) 5 % Place 1 patch onto the skin daily as needed. Remove & Discard patch within 12 hours or as directed by MD   LORazepam (ATIVAN) 1 MG tablet Take 1 tablet (1 mg total) by mouth at bedtime.   meloxicam (MOBIC) 7.5 MG tablet Take 1 tablet (7.5 mg total) by mouth daily. (Patient not taking: Reported on 01/11/2022)   metFORMIN (GLUCOPHAGE) 1000 MG tablet Take 1 tablet (1,000 mg total) by mouth 2 (two) times daily with a meal.   ondansetron (ZOFRAN) 4 MG tablet Take 1 tablet (4 mg total) by mouth every 8 (eight) hours as needed for nausea or vomiting.   prazosin (MINIPRESS) 2 MG capsule Take 1 capsule (2 mg total)  by mouth at bedtime.   propranolol (INDERAL) 20 MG tablet Take 1 tablet (20 mg total) by mouth 2 (two) times daily with a meal.   QUEtiapine (SEROQUEL) 200 MG tablet Take 1 tablet (200 mg total) by mouth at bedtime.   No facility-administered encounter medications on file as of 01/19/2022.    Patient Active Problem List   Diagnosis Date Noted   Altered mental status 12/03/2021   Anxiety 11/28/2021   Memory loss 10/26/2021   Bilateral hearing loss 10/26/2021   Mild neurocognitive disorder due to  multiple etiologies 09/07/2021   Degenerative lumbar spinal stenosis    Anterolisthesis of lumbar spine    Lump in upper inner quadrant of left breast 07/13/2021   Left flank pain 07/13/2021   Lumbar radiculopathy 01/26/2021   Body mass index (BMI) 36.0-36.9, adult 01/11/2021   Degeneration of lumbar intervertebral disc 08/22/2020   Retrolisthesis of vertebrae 08/22/2020   Foraminal stenosis of lumbar region 08/22/2020   Generalized anxiety disorder 09/01/2018   Type II diabetes mellitus 06/05/2018   Tinea corporis 09/01/2017   Left upper quadrant pain 09/01/2017   Estrogen deficiency 09/01/2017   Thoracic aortic atherosclerosis 05/14/2017   COPD (chronic obstructive pulmonary disease) 10/30/2016   Allergic rhinitis 10/03/2016   Palpitations 07/20/2016   DOE (dyspnea on exertion) 07/20/2016   External hemorrhoid 08/01/2015   Stress incontinence, female 08/01/2015   Dermatitis 06/02/2015   Shingles outbreak 04/14/2015   Hyperlipidemia associated with type 2 diabetes mellitus ([place])    Fibromyalgia 10/14/2014   Bilateral hip pain 09/02/2014   Deformity of finger 07/15/2014   Ceruminosis 03/04/2014   Tinnitus 03/04/2014   Torticollis 03/04/2014   Bipolar 2 disorder 12/03/2013   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-sided sciatica 08/06/2012   Lower abdominal pain 08/06/2012   Otitis media 09/05/2010   Peripheral neuropathy 02/22/2010   Lumbar back pain with radiculopathy affecting left lower extremity 01/24/2010   Cervicalgia 06/20/2007   Chronic low back pain 06/20/2007   Essential (primary) hypertension 04/23/2007   Inflammatory disease of uterus 04/23/2007    Conditions to be addressed/monitored: HTN, Anxiety, and Depression.  Limited Social Support, Level of Care Concerns, ADL/IADL Limitations, Mental Health Concerns, Social Isolation, Limited Access to Caregiver, Cognitive Deficits, Memory Deficits, and Lacks Knowledge of [employer].  Care Plan : LCSW Plan of  Care  Updates made by Francis Gaines, LCSW since 01/19/2022 12:00 AM     Problem: Improve Quality of Life through [facility]   Priority: High     Goal: Improve Quality of Life through [facility].   Start Date: 11/27/2021  Expected End Date: 03/27/2022  This Visit's Progress: On track  Recent Progress: On track  Priority: High  Note:   Current Barriers:   Patient with Thoracic Aortic Atherosclerosis, Essential Hypertension, Chronic Obstructive Pulmonary Disease, Type II Diabetes Mellitus, Mild Neurocognitive Disorder, Bilateral Hearing Loss, Memory Loss, Fibromyalgia, Bipolar II Disorder, Generalized Anxiety Disorder, and Caregiver Stress needs Support, Education, Resources, Referrals, Advocacy, and Care Coordination to resolve unmet personal care needs in the home. Patient is unable to self-administer medications as prescribed, or consistently perform ADL's/IADL's independently. Lacks knowledge of available community agencies and resources. Clinical Goals:  Patient, husband, and daughter will work with LCSW, in an effort to coordinate in-home care services, through [employer].   Patient will demonstrate improved health management independence, as evidenced by having in-home care services in place. Interventions: Collaboration with Primary Care Physician, Dr. Roma Schanz,  regarding development and update of comprehensive plan of care, as evidenced by provider attestation and co-signature. Collaboration with Primary Care Physician, Dr. Roma Schanz, to report patient and daughter's request for patient to resume aquatic therapy, and to obtain medical clearance. Inter-disciplinary care team collaboration (see longitudinal plan of care). Clinical Interventions: Problem Solving/Task-Centered Solutions Developed. Caregiver Stress Acknowledged/Empathetic Listening Provided. Emotional Support Provided/Verbalization of Feelings  Discussed. Patient Goals/Self-Care Activities:  Continue to receive bi-weekly social work services from [facility], in an effort to coordinate hours of in-home care services, through long-term care insurance policy, as well as home health care services. Collaboration with daughter, Pincus Large to confirm that you are only receiving [place], not [place] and [place] [facility], through [facility] [phone]), as originally requested.   LCSW collaboration with Sharmon Revere, [place] Nurse with [facility], to inquire about [place] and [place] [facility].    ~ Sharmon Revere, [place] Nurse with [facility], reported only receiving order for [place]. ~ LCSW Collaboration with Primary Care Physician, Dr. Roma Schanz, to request order for [place] and [place] [facility], and fax to [facility], per daughter, Pincus Large request. LCSW collaboration with Primary Care Physician, Dr. Roma Schanz to request order and medical clearance for patient to return to aquatic center for water aerobic exercises. Keep routine follow-up appointments with Donnal Moat, Psychiatric Physician Assistant - Certified with [facility] [phone]), for psychotropic medication administration/management and psychotherapeutic counseling services. Contact LCSW directly (# [phone]), if you have questions, need assistance, or if additional social work needs are identified between now and our next scheduled telephone outreach call.  Follow-Up Plan:  02/01/2022 at 3:00 pm       Nat Christen [place] Clinical Social Worker [place] [phone]

## 2022-01-19 NOTE — Patient Instructions (Signed)
Visit Information ? ?Thank you for taking time to visit with me today. Please don't hesitate to contact me if I can be of assistance to you before our next scheduled telephone appointment. ? ?Following are the goals we discussed today:  ?Patient Goals/Self-Care Activities:  ?Continue to receive bi-weekly social work services from [facility], in an effort to coordinate hours of in-home care services, through long-term care insurance policy, as well as home health care services. ?Collaboration with daughter, Pincus Large to confirm that you are only receiving [place], not [place] and [place] [facility], through [facility] [phone]), as originally requested.   ?LCSW collaboration with Sharmon Revere, [place] Nurse with [facility], to inquire about [place] and [place] [facility].   ? ~ Sharmon Revere, [place] Nurse with [facility], reported only receiving order for [place]. ?~ LCSW Collaboration with Primary Care Physician, Dr. Roma Schanz, to request order for [place] and [place] [facility], and fax to [facility], per daughter, Pincus Large request. ?LCSW collaboration with Primary Care Physician, Dr. Roma Schanz to request order and medical clearance for patient to return to aquatic center for water aerobic exercises. ?Keep routine follow-up appointments with Donnal Moat, Psychiatric Physician Assistant - Certified with [facility] [phone]), for psychotropic medication administration/management and psychotherapeutic counseling services. ?Contact LCSW directly (# [phone]), if you have questions, need assistance, or if additional social work needs are identified between now and our next scheduled telephone outreach call.  ?Follow-Up Plan:  02/01/2022 at 3:00 pm  ? ?Please call the care guide team at [phone]  if you need to cancel or reschedule your appointment.  ? ?If you are experiencing a Mental Health or [place] or need someone to talk to, please call the Suicide and Crisis Lifeline: 988 ?call the [place] National Suicide Prevention Lifeline: 364-029-6097 or TTY: 206-429-9212 TTY 740-181-8862) to talk to a trained counselor ?call 1-800-273-TALK (toll free, 24 hour hotline) ?go to [facility] [address], [place] [phone]) ?call the [facility]: [phone] ?call 911  ? ?Patient verbalizes understanding of instructions and care plan provided today and agrees to view in [place]. Active MyChart status confirmed with patient.   ? ?Nat Christen LCSW ?Licensed Clinical Social Worker ?[place] ?[phone]  ?

## 2022-01-22 ENCOUNTER — Encounter: Payer: Self-pay | Admitting: Family Medicine

## 2022-01-22 ENCOUNTER — Ambulatory Visit (INDEPENDENT_AMBULATORY_CARE_PROVIDER_SITE_OTHER): Payer: Medicare Other | Admitting: Family Medicine

## 2022-01-22 VITALS — BP 142/80 | HR 68 | Temp 97.8°F | Resp 18 | Ht 62.0 in | Wt 183.3 lb

## 2022-01-22 DIAGNOSIS — E1169 Type 2 diabetes mellitus with other specified complication: Secondary | ICD-10-CM

## 2022-01-22 DIAGNOSIS — F3181 Bipolar II disorder: Secondary | ICD-10-CM | POA: Diagnosis not present

## 2022-01-22 DIAGNOSIS — E785 Hyperlipidemia, unspecified: Secondary | ICD-10-CM | POA: Diagnosis not present

## 2022-01-22 DIAGNOSIS — M5440 Lumbago with sciatica, unspecified side: Secondary | ICD-10-CM | POA: Diagnosis not present

## 2022-01-22 DIAGNOSIS — I10 Essential (primary) hypertension: Secondary | ICD-10-CM | POA: Diagnosis not present

## 2022-01-22 DIAGNOSIS — F319 Bipolar disorder, unspecified: Secondary | ICD-10-CM | POA: Insufficient documentation

## 2022-01-22 DIAGNOSIS — G8929 Other chronic pain: Secondary | ICD-10-CM

## 2022-01-22 DIAGNOSIS — J449 Chronic obstructive pulmonary disease, unspecified: Secondary | ICD-10-CM

## 2022-01-22 NOTE — Assessment & Plan Note (Signed)
Per psych 

## 2022-01-22 NOTE — Assessment & Plan Note (Signed)
Encourage heart healthy diet such as MIND or DASH diet, increase exercise, avoid trans fats, simple carbohydrates and processed foods, consider a krill or fish or flaxseed oil cap daily.  °

## 2022-01-22 NOTE — Assessment & Plan Note (Signed)
con't albuterol prm ?

## 2022-01-22 NOTE — Progress Notes (Incomplete)
Subjective:   By signing my name below, I, Megan Duncan, attest that this documentation has been prepared under the direction and in the presence of Megan Duncan, 01/22/2022    Patient ID: Megan Duncan, female    DOB: 12/24/1945, 76 y.o.   MRN: [id number]  No chief complaint on file.   HPI Patient is in today for an office visit. Patient is with her daughter.  Patient recently had a manic episode where she had hallucinations. She had fallen that night and proceeded to regain energy the following morning.  She experienced these symptoms after the dosage of Seroquel and Haldol was changed. Her cognition has been improving.   She is interested in doing aquatic therapy to increase her exercising.  Past Medical History:  Diagnosis Date   Acute pain of right shoulder 09/24/2018   Acute upper respiratory infection 09/19/2010   Allergic rhinitis 10/03/2016   Anterolisthesis of lumbar spine    Arthritis    hands   Bilateral hip pain 09/02/2014   Bipolar II disorder    Cellulitis and abscess of trunk 09/15/2009   Ceruminosis 03/04/2014   Cervicalgia 06/20/2007   Chronic low back pain 06/20/2007   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-sided sciatica 08/06/2012   COPD (chronic obstructive pulmonary disease) (HCC)    Deformity of finger 07/15/2014   Degeneration of lumbar intervertebral disc 08/22/2020   Degenerative lumbar spinal stenosis    Dermatitis 43/32/9518   Diastolic dysfunction    Per pt, diagnosed after [facility]   Diverticulosis    DOE (dyspnea on exertion) 07/20/2016   Essential (primary) hypertension 04/23/2007   Estrogen deficiency 09/01/2017   External hemorrhoid 08/01/2015   Fibromyalgia    Foraminal stenosis of lumbar region 08/22/2020   Generalized anxiety disorder 09/01/2018   Hyperlipidemia    Inflammatory disease of uterus 04/23/2007   Left flank pain 07/13/2021   Left upper quadrant pain 09/01/2017   Lower  abdominal pain 08/06/2012   Lumbar back pain with radiculopathy affecting left lower extremity 01/24/2010   Lumbar radiculopathy 01/26/2021   Lump in upper inner quadrant of left breast 07/13/2021   Major depressive disorder    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   Otitis media 09/05/2010   Palpitations 07/20/2016   Peripheral neuropathy 02/22/2010   Retrolisthesis of vertebrae 08/22/2020   Shingles outbreak 04/14/2015   Sinusitis, acute maxillary 09/14/2013   Stress incontinence, female 08/01/2015   Thoracic aortic atherosclerosis 05/14/2017   Tinea corporis 09/01/2017   Tinnitus 03/04/2014   Torticollis 03/04/2014   Type II diabetes mellitus 06/05/2018    Past Surgical History:  Procedure Laterality Date   APPENDECTOMY     EYE SURGERY     KNEE ARTHROSCOPY Right 11/12/2006   TONSILLECTOMY     TOTAL ABDOMINAL HYSTERECTOMY      Family History  Problem Relation Age of Onset   Hypertension Mother    Hiatal hernia Mother    Diabetes Father    Hypertension Father    Heart attack Father    Alcoholism Father    Bipolar disorder Father    Mental illness Sister    Suicidality Sister    Colon cancer Maternal Aunt 80   Breast cancer Maternal Aunt    Bipolar disorder Paternal Aunt     Social History   Socioeconomic History   Marital status: Married    Spouse name: Megan Duncan   Number of children: 3   Years of education: 19  Highest education level: Some college, no degree  Occupational History   Occupation: retired  Tobacco Use   Smoking status: Former    Packs/day: 1.50    Years: 40.00    Pack years: 60.00    Types: Cigarettes    Quit date: 07/02/2004    Years since quitting: 17.5    Passive exposure: Past   Smokeless tobacco: Never   Tobacco comments:    Verified by Aldona Lento (Husband), & Lauralee Evener (Daughter)  Vaping Use   Vaping Use: Never used  Substance and Sexual  Activity   Alcohol use: Yes    Alcohol/week: 0.0 - 1.0 standard drinks    Comment: occasional wine   Drug use: No   Sexual activity: Never  Other Topics Concern   Not on file  Social History Narrative   Lives with husband and grandson she is raising.   11/07/21 Lives with husband   Social Determinants of Health   Financial Resource Strain: Medium Risk   Difficulty of Paying Living Expenses: Somewhat hard  Food Insecurity: No Food Insecurity   Worried About Charity fundraiser in the Last Year: Never true   Ran Out of Food in the Last Year: Never true  Transportation Needs: No Transportation Needs   Lack of Transportation (Medical): No   Lack of Transportation (Non-Medical): No  Physical Activity: Inactive   Days of Exercise per Week: 0 days   Minutes of Exercise per Session: 0 min  Stress: No Stress Concern Present   Feeling of Stress : Not at all  Social Connections: Socially Integrated   Frequency of Communication with Friends and Family: More than three times a week   Frequency of Social Gatherings with Friends and Family: More than three times a week   Attends Religious Services: 1 to 4 times per year   Active Member of [employer] or Organizations: Yes   Attends Music therapist: More than 4 times per year   Marital Status: Married  Human resources officer Violence: Not At Risk   Fear of Current or Ex-Partner: No   Emotionally Abused: No   Physically Abused: No   Sexually Abused: No    Outpatient Medications Prior to Visit  Medication Sig Dispense Refill   albuterol (VENTOLIN HFA) 108 (90 Base) MCG/ACT inhaler Inhale 2 puffs into the lungs every 4 (four) hours as needed. For shortness of breath and wheezing 18 g 3   Aspirin-Caffeine (BAYER BACK & BODY) 500-32.5 MG TABS Take 1 tablet by mouth daily as needed. Reports takes 1-2 as needed     blood glucose meter kit and supplies Dispense based on patient and insurance preference. Use once a day.   DX Code: E11.9 1 each 0   donepezil (ARICEPT) 5 MG tablet Take 1 tablet (5 mg total) by mouth at bedtime. 30 tablet 2   DULoxetine (CYMBALTA) 60 MG capsule Take 1 capsule (60 mg total) by mouth daily. 30 capsule 2   glucose blood test strip Use as instructed once a day.  Dx Code: E11.9 30 each 2   haloperidol (HALDOL) 1 MG tablet Take 1 tablet (1 mg total) by mouth 2 (two) times daily at 8 am and 4 pm. 60 tablet 2   hydrocortisone-pramoxine (ANALPRAM HC) 2.5-1 % rectal cream Place 1 application rectally 3 (three) times daily. 30 g 3   Lancets MISC Use as directed once a day.  Dx code: E11.9 30 each 2   levothyroxine (SYNTHROID) 50 MCG tablet TAKE 1  TABLET BY MOUTH EVERY DAY BEFORE BREAKFAST 90 tablet 1   lidocaine (LIDODERM) 5 % Place 1 patch onto the skin daily as needed. Remove & Discard patch within 12 hours or as directed by MD 30 patch 0   LORazepam (ATIVAN) 1 MG tablet Take 1 tablet (1 mg total) by mouth at bedtime. 30 tablet 2   meloxicam (MOBIC) 7.5 MG tablet Take 1 tablet (7.5 mg total) by mouth daily. (Patient not taking: Reported on 01/11/2022) 30 tablet 2   metFORMIN (GLUCOPHAGE) 1000 MG tablet Take 1 tablet (1,000 mg total) by mouth 2 (two) times daily with a meal. 60 tablet 2   ondansetron (ZOFRAN) 4 MG tablet Take 1 tablet (4 mg total) by mouth every 8 (eight) hours as needed for nausea or vomiting. 15 tablet 0   prazosin (MINIPRESS) 2 MG capsule Take 1 capsule (2 mg total) by mouth at bedtime. 30 capsule 2   propranolol (INDERAL) 20 MG tablet Take 1 tablet (20 mg total) by mouth 2 (two) times daily with a meal. 60 tablet 2   QUEtiapine (SEROQUEL) 200 MG tablet Take 1 tablet (200 mg total) by mouth at bedtime. 30 tablet 2   No facility-administered medications prior to visit.    Allergies  Allergen Reactions   Atorvastatin Other (See Comments)    Significant rise in liver tests   Codeine Nausea And Vomiting   Diclofenac Other (See Comments)    Elevated LFT's    Doxycycline     Extreme nausea   Sulfonamide Derivatives Swelling    Review of Systems  Musculoskeletal:  Positive for falls.  Psychiatric/Behavioral:  Positive for hallucinations.       Objective:    Physical Exam Constitutional:      General: She is not in acute distress.    Appearance: Normal appearance. She is not ill-appearing.  HENT:     Head: Normocephalic and atraumatic.     Right Ear: External ear normal.     Left Ear: External ear normal.  Eyes:     Extraocular Movements: Extraocular movements intact.     Pupils: Pupils are equal, round, and reactive to light.  Cardiovascular:     Rate and Rhythm: Normal rate and regular rhythm.     Heart sounds: Normal heart sounds. No murmur heard.   No gallop.  Pulmonary:     Effort: Pulmonary effort is normal. No respiratory distress.     Breath sounds: Normal breath sounds. No wheezing or rales.  Skin:    General: Skin is warm and dry.  Neurological:     Mental Status: She is alert and oriented to person, place, and time.  Psychiatric:        Judgment: Judgment normal.    There were no vitals taken for this visit. Wt Readings from Last 3 Encounters:  12/03/21 190 lb (86.2 kg)  12/01/21 179 lb 14.3 oz (81.6 kg)  11/25/21 180 lb (81.6 kg)    Diabetic Foot Exam - Simple   No data filed    Lab Results  Component Value Date   WBC 9.6 12/02/2021   HGB 13.9 12/02/2021   HCT 44.0 12/02/2021   PLT 332 12/02/2021   GLUCOSE 128 (H) 12/02/2021   CHOL 186 10/19/2021   TRIG 140.0 10/19/2021   HDL 70.90 10/19/2021   LDLDIRECT 120.1 12/03/2013   LDLCALC 87 10/19/2021   ALT 20 12/02/2021   AST 24 12/02/2021   NA 138 12/02/2021   K 3.9 12/02/2021   CL  106 12/02/2021   CREATININE 0.81 12/02/2021   BUN 12 12/02/2021   CO2 21 (L) 12/02/2021   TSH 0.693 12/02/2021   INR 1.0 10/25/2015   HGBA1C 6.4 10/27/2021   MICROALBUR 2.7 (H) 09/22/2020    Lab Results  Component Value Date   TSH 0.693 12/02/2021   Lab  Results  Component Value Date   WBC 9.6 12/02/2021   HGB 13.9 12/02/2021   HCT 44.0 12/02/2021   MCV 98.4 12/02/2021   PLT 332 12/02/2021   Lab Results  Component Value Date   NA 138 12/02/2021   K 3.9 12/02/2021   CO2 21 (L) 12/02/2021   GLUCOSE 128 (H) 12/02/2021   BUN 12 12/02/2021   CREATININE 0.81 12/02/2021   BILITOT 0.9 12/02/2021   ALKPHOS 71 12/02/2021   AST 24 12/02/2021   ALT 20 12/02/2021   PROT 7.3 12/02/2021   ALBUMIN 4.3 12/02/2021   CALCIUM 9.7 12/02/2021   ANIONGAP 11 12/02/2021   GFR 55.12 (L) 10/23/2021   Lab Results  Component Value Date   CHOL 186 10/19/2021   Lab Results  Component Value Date   HDL 70.90 10/19/2021   Lab Results  Component Value Date   LDLCALC 87 10/19/2021   Lab Results  Component Value Date   TRIG 140.0 10/19/2021   Lab Results  Component Value Date   CHOLHDL 3 10/19/2021   Lab Results  Component Value Date   HGBA1C 6.4 10/27/2021       Assessment & Plan:   Problem List Items Addressed This Visit   None     No orders of the defined types were placed in this encounter.   I, Megan Duncan, personally preformed the services described in this documentation.  All medical record entries made by the scribe were at my direction and in my presence.  I have reviewed the chart and discharge instructions (if applicable) and agree that the record reflects my personal performance and is accurate and complete. 01/22/2022   I,Amber Collins,acting as a scribe for [employer], Duncan.,have documented all relevant documentation on the behalf of Ann Held, Duncan,as directed by  Ann Held, Duncan while in the presence of Ann Held, Duncan.    [employer]

## 2022-01-22 NOTE — Patient Instructions (Signed)
Bipolar I Disorder ?Bipolar I disorder is a mental health disorder in which a person has episodes of emotional highs (mania) and may also have episodes of lows (depression). Bipolar I disorder differs from other bipolar disorders in that it involves extreme episodes of mania (manic episodes). These episodes last at least one week or involve severe symptoms that require a hospital stay to keep the person safe. ?What are the causes? ?The cause of this condition is not known. ?What increases the risk? ?The following factors may make you more likely to develop this condition: ?Having a family member with the disorder. ?Having an imbalance of certain chemicals in the brain (neurotransmitters). ?Experiencing stress, such as from illness, financial problems, or a death. ?Having certain conditions that affect the brain or spinal cord (neurologic conditions). ?Having had a brain injury (trauma). ?What are the signs or symptoms? ?Symptoms of bipolar I disorder include the following: ?Symptoms of mania ?Very high self-esteem or self-confidence. ?Less need for sleep. ?Unusual talkativeness. Speech may be very fast. ?Racing thoughts with quick shifts between topics that may or may not be related (flight of ideas). ?Being much more able or much less able to concentrate. ?Increased purposeful activity, such as work, studies, or social activity. ?Increased agitation. This could be pacing, squirming, fidgeting, or finger and toe tapping. ?Impulsive behavior and poor judgment. These may lead to high-risk activities that are sexual, financial, or physical. ?Symptoms of depression ?Extreme sadness, uncontrollable crying, or feeling hopeless, worthless, or numb. ?Sleep problems, such as trouble falling asleep or staying asleep (insomnia), waking early, or sleeping too much. ?No longer enjoying things you used to enjoy. ?Isolation, or spending time alone often. ?Lack of energy or motivation, and moving more slowly than normal. ?Trouble  making decisions. ?Increased appetite or loss of appetite. ?Thoughts of death, or wanting to harm yourself. ?Sometimes, you may have a mixed mood. This means having symptoms of mania and depression at the same time. Stress can often trigger these symptoms. ?How is this diagnosed? ?This condition may be diagnosed based on a mental health evaluation that includes a review of: ?Emotional episodes. ?Medical history. ?Use of alcohol, drugs, and prescription medicines. Certain medical conditions and substances can cause secondary bipolar disorder. This has symptoms like the symptoms of bipolar disorder. ?Your health care provider may ask you to take a short test to help understand your symptoms. You may also be asked to follow up with a mental health provider for further evaluation or to start treatment. ?How is this treated? ?  ?This condition is a long-term (chronic) illness. It is often managed with ongoing treatment rather than treatment only when symptoms occur. A combination of treatments is often used. Treatment may include: ?Medicines, usually medicines called mood stabilizers. Medicines can be prescribed by a health care provider who specializes in treating mental health disorders (psychiatrist). If symptoms occur during use of a mood stabilizer, other medicines may be added. ?Talk therapy (psychotherapy) to help you manage bipolar disorder. Talk therapy includes cognitive behavioral therapy (CBT) and family therapy. ?Psychoeducation. This helps you and others understand how your disorder is managed. Include friends and family in educational sessions so they learn how best to support you. ?Methods of managing your condition, such as journaling or relaxation exercises. Exercises include: ?Yoga. ?Meditation. ?Deep breathing. ?Lifestyle changes, such as: ?Limiting alcohol and drug use. ?Exercising regularly. ?Setting a regular bedtime and wake time. ?Eating a healthy diet. ?Electroconvulsive therapy (ECT). This is a  procedure in which electricity is applied to  the brain through the scalp. ECT may be used for severe bipolar disorder when medicine and psychotherapy work too slowly or do not work. ?Follow these instructions at home: ?Activity ?Return to your normal activities as told by your health care provider. ?Find activities that you enjoy, and make time to do them. ?Get regular exercise. ?Lifestyle ? ?Follow a set schedule for eating and sleeping. ?Eat a healthy diet that includes fresh fruits and vegetables, whole grains, low-fat dairy, and lean meat. ?Get at least 7-8 hours of sleep each night. ?Avoid using products that contain nicotine or tobacco. If you want help quitting, ask your health care provider. ?Avoid alcohol and drugs. They can affect how medicine works and make symptoms worse. ?General instructions ?Take over-the-counter and prescription medicines only as told by your health care provider. You may think about stopping your medicine, but you need to take all your medicine as prescribed. This helps manage your symptoms. ?Consider joining a support group. Your health care provider may be able to recommend one. ?Talk with your family and friends about your treatment goals and how loved ones can help. ?Keep all follow-up visits. This is important. ?Where to find more information ?National Alliance on Mental Illness: nami.org ?[employer] of Mental Health: https://www.frey.org/ ?Contact a health care provider if: ?Your symptoms get worse, or your loved ones tell you that your symptoms are getting worse. ?You have uncomfortable side effects from your medicine. ?You have trouble sleeping. ?You have trouble doing daily activities. ?You feel unsafe in your surroundings. ?You are using alcohol or drugs to manage your symptoms. ?Get help right away if: ?You have new symptoms. ?You have thoughts about harming yourself or others. ?You are considering suicide. ?If you ever feel like you may hurt yourself or others, or have  thoughts about taking your own life, get help right away. Go to your nearest emergency department or: ?Call your local emergency services (911 in the [place] ?Call a suicide crisis helpline, such as the [place] at (814) 866-0770 or 988 in the [place]. This is open 24 hours a day in the [place] ?Text the Crisis Text Line at (401) 660-8987 (in the [place].). ?Summary ?Bipolar I disorder is a lifelong mental health disorder in which a person has episodes of mania and depression. ?Treatment for this disorder involves a combination of treatments such as medicines and talk and behavioral therapies. ?Include friends and family in educational sessions so they know how best to support you. ?Get help right away if you are considering suicide. ?This information is not intended to replace advice given to you by your health care provider. Make sure you discuss any questions you have with your health care provider. ?Document Revised: 05/25/2021 Document Reviewed: 04/20/2021 ?Elsevier Patient Education ? [employer]. ? ?

## 2022-01-22 NOTE — Progress Notes (Signed)
Subjective:   By signing my name below, I, [name], attest that this documentation has been prepared under the direction and in the presence of Roma Schanz DO, 01/22/2022    Patient ID: Megan Duncan, female    DOB: 1946/02/08, 76 y.o.   MRN: [id number]  Chief Complaint  Patient presents with   Hospitalization Follow-up    HPI Patient is in today for an office visit. Patient is with her daughter.  Patient recently experienced hallucinations. She had fallen the night she had hallucinations.  She proceeded to regain her energy the following morning.  She experienced these symptoms after the dosage of Seroquel and Haldol was changed. Her cognition has been improving after readjustment of medications.  Patient reports that at night when she is about to fall asleep, she hears singing in her left ear.  Patient complains of eyelid sagging. She also sees curvature on otherwise straight objects in her left eye. She states that they frequently close. She has been wearing glasses but they are not helping symptoms.  She has been using 108 MCG/ACT albuterol more often.   She is interested in doing aquatic therapy to increase her movement.   Past Medical History:  Diagnosis Date   Acute pain of right shoulder 09/24/2018   Acute upper respiratory infection 09/19/2010   Allergic rhinitis 10/03/2016   Anterolisthesis of lumbar spine    Arthritis    hands   Bilateral hip pain 09/02/2014   Bipolar II disorder    Cellulitis and abscess of trunk 09/15/2009   Ceruminosis 03/04/2014   Cervicalgia 06/20/2007   Chronic low back pain 06/20/2007   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-sided sciatica 08/06/2012   COPD (chronic obstructive pulmonary disease) (HCC)    Deformity of finger 07/15/2014   Degeneration of lumbar intervertebral disc 08/22/2020   Degenerative lumbar spinal stenosis    Dermatitis 49/75/3005   Diastolic dysfunction    Per pt, diagnosed after [facility]    Diverticulosis    DOE (dyspnea on exertion) 07/20/2016   Essential (primary) hypertension 04/23/2007   Estrogen deficiency 09/01/2017   External hemorrhoid 08/01/2015   Fibromyalgia    Foraminal stenosis of lumbar region 08/22/2020   Generalized anxiety disorder 09/01/2018   Hyperlipidemia    Inflammatory disease of uterus 04/23/2007   Left flank pain 07/13/2021   Left upper quadrant pain 09/01/2017   Lower abdominal pain 08/06/2012   Lumbar back pain with radiculopathy affecting left lower extremity 01/24/2010   Lumbar radiculopathy 01/26/2021   Lump in upper inner quadrant of left breast 07/13/2021   Major depressive disorder    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   Otitis media 09/05/2010   Palpitations 07/20/2016   Peripheral neuropathy 02/22/2010   Retrolisthesis of vertebrae 08/22/2020   Shingles outbreak 04/14/2015   Sinusitis, acute maxillary 09/14/2013   Stress incontinence, female 08/01/2015   Thoracic aortic atherosclerosis 05/14/2017   Tinea corporis 09/01/2017   Tinnitus 03/04/2014   Torticollis 03/04/2014   Type II diabetes mellitus 06/05/2018    Past Surgical History:  Procedure Laterality Date   APPENDECTOMY     EYE SURGERY     KNEE ARTHROSCOPY Right 11/12/2006   TONSILLECTOMY     TOTAL ABDOMINAL HYSTERECTOMY      Family History  Problem Relation Age of Onset   Hypertension Mother    Hiatal hernia Mother    Diabetes Father    Hypertension Father    Heart attack Father    Alcoholism  Father    Bipolar disorder Father    Mental illness Sister    Suicidality Sister    Colon cancer Maternal Aunt 69   Breast cancer Maternal Aunt    Bipolar disorder Paternal Aunt     Social History   Socioeconomic History   Marital status: Married    Spouse name: Vastie Douty   Number of children: 3   Years of education: 14   Highest education level: Some college, no degree  Occupational History   Occupation: retired  Tobacco  Use   Smoking status: Former    Packs/day: 1.50    Years: 40.00    Pack years: 60.00    Types: Cigarettes    Quit date: 07/02/2004    Years since quitting: 17.5    Passive exposure: Past   Smokeless tobacco: Never   Tobacco comments:    Verified by Aldona Lento (Husband), & Lauralee Evener (Daughter)  Vaping Use   Vaping Use: Never used  Substance and Sexual Activity   Alcohol use: Yes    Alcohol/week: 0.0 - 1.0 standard drinks    Comment: occasional wine   Drug use: No   Sexual activity: Never  Other Topics Concern   Not on file  Social History Narrative   Lives with husband and grandson she is raising.   11/07/21 Lives with husband   Social Determinants of Health   Financial Resource Strain: Medium Risk   Difficulty of Paying Living Expenses: Somewhat hard  Food Insecurity: No Food Insecurity   Worried About Charity fundraiser in the Last Year: Never true   Ran Out of Food in the Last Year: Never true  Transportation Needs: No Transportation Needs   Lack of Transportation (Medical): No   Lack of Transportation (Non-Medical): No  Physical Activity: Inactive   Days of Exercise per Week: 0 days   Minutes of Exercise per Session: 0 min  Stress: No Stress Concern Present   Feeling of Stress : Not at all  Social Connections: Socially Integrated   Frequency of Communication with Friends and Family: More than three times a week   Frequency of Social Gatherings with Friends and Family: More than three times a week   Attends Religious Services: 1 to 4 times per year   Active Member of [employer] or Organizations: Yes   Attends Music therapist: More than 4 times per year   Marital Status: Married  Human resources officer Violence: Not At Risk   Fear of Current or Ex-Partner: No   Emotionally Abused: No   Physically Abused: No   Sexually Abused: No    Outpatient Medications Prior to Visit  Medication Sig Dispense Refill   albuterol (VENTOLIN HFA) 108 (90 Base) MCG/ACT  inhaler Inhale 2 puffs into the lungs every 4 (four) hours as needed. For shortness of breath and wheezing 18 g 3   Aspirin-Caffeine (BAYER BACK & BODY) 500-32.5 MG TABS Take 1 tablet by mouth daily as needed. Reports takes 1-2 as needed     blood glucose meter kit and supplies Dispense based on patient and insurance preference. Use once a day.  DX Code: E11.9 1 each 0   donepezil (ARICEPT) 5 MG tablet Take 1 tablet (5 mg total) by mouth at bedtime. 30 tablet 2   DULoxetine (CYMBALTA) 60 MG capsule Take 1 capsule (60 mg total) by mouth daily. 30 capsule 2   glucose blood test strip Use as instructed once a day.  Dx Code: E11.9 30 each  2   haloperidol (HALDOL) 1 MG tablet Take 1 tablet (1 mg total) by mouth 2 (two) times daily at 8 am and 4 pm. 60 tablet 2   hydrocortisone-pramoxine (ANALPRAM HC) 2.5-1 % rectal cream Place 1 application rectally 3 (three) times daily. 30 g 3   Lancets MISC Use as directed once a day.  Dx code: E11.9 30 each 2   levothyroxine (SYNTHROID) 50 MCG tablet TAKE 1 TABLET BY MOUTH EVERY DAY BEFORE BREAKFAST 90 tablet 1   lidocaine (LIDODERM) 5 % Place 1 patch onto the skin daily as needed. Remove & Discard patch within 12 hours or as directed by MD 30 patch 0   LORazepam (ATIVAN) 1 MG tablet Take 1 tablet (1 mg total) by mouth at bedtime. 30 tablet 2   metFORMIN (GLUCOPHAGE) 1000 MG tablet Take 1 tablet (1,000 mg total) by mouth 2 (two) times daily with a meal. 60 tablet 2   ondansetron (ZOFRAN) 4 MG tablet Take 1 tablet (4 mg total) by mouth every 8 (eight) hours as needed for nausea or vomiting. 15 tablet 0   prazosin (MINIPRESS) 2 MG capsule Take 1 capsule (2 mg total) by mouth at bedtime. 30 capsule 2   propranolol (INDERAL) 20 MG tablet Take 1 tablet (20 mg total) by mouth 2 (two) times daily with a meal. 60 tablet 2   QUEtiapine (SEROQUEL) 200 MG tablet Take 1 tablet (200 mg total) by mouth at bedtime. 30 tablet 2   meloxicam (MOBIC) 7.5 MG tablet Take 1 tablet (7.5 mg  total) by mouth daily. (Patient not taking: Reported on 01/11/2022) 30 tablet 2   No facility-administered medications prior to visit.    Allergies  Allergen Reactions   Atorvastatin Other (See Comments)    Significant rise in liver tests   Codeine Nausea And Vomiting   Diclofenac Other (See Comments)    Elevated LFT's   Doxycycline     Extreme nausea   Sulfonamide Derivatives Swelling    Review of Systems  HENT:         (-) Singing of Left Ear  Eyes:        (+) Eyelid sagging (+) Frequent eye closing (+) Disoriented eye vision  Musculoskeletal:  Positive for falls.  Psychiatric/Behavioral:  Positive for hallucinations.       Objective:    Physical Exam Constitutional:      General: She is not in acute distress.    Appearance: Normal appearance. She is not ill-appearing.  HENT:     Head: Normocephalic and atraumatic.     Right Ear: External ear normal.     Left Ear: External ear normal.  Eyes:     Extraocular Movements: Extraocular movements intact.     Pupils: Pupils are equal, round, and reactive to light.  Cardiovascular:     Rate and Rhythm: Normal rate and regular rhythm.     Heart sounds: Normal heart sounds. No murmur heard.   No gallop.  Pulmonary:     Effort: Pulmonary effort is normal. No respiratory distress.     Breath sounds: Normal breath sounds. No wheezing or rales.  Skin:    General: Skin is warm and dry.  Neurological:     Mental Status: She is alert and oriented to person, place, and time.  Psychiatric:        Judgment: Judgment normal.    BP (!) 142/80 (BP Location: Left Arm, Patient Position: Sitting, Cuff Size: Large)    Pulse 68  Temp 97.8 F (36.6 C) (Oral)    Resp 18    Ht _0  (1.575 m)    Wt 183 lb 4.8 oz (83.1 kg)    SpO2 95%    BMI 33.53 kg/m  Wt Readings from Last 3 Encounters:  01/22/22 183 lb 4.8 oz (83.1 kg)  12/03/21 190 lb (86.2 kg)  12/01/21 179 lb 14.3 oz (81.6 kg)    Diabetic Foot Exam - Simple   No data filed     Lab Results  Component Value Date   WBC 9.6 12/02/2021   HGB 13.9 12/02/2021   HCT 44.0 12/02/2021   PLT 332 12/02/2021   GLUCOSE 128 (H) 12/02/2021   CHOL 186 10/19/2021   TRIG 140.0 10/19/2021   HDL 70.90 10/19/2021   LDLDIRECT 120.1 12/03/2013   LDLCALC 87 10/19/2021   ALT 20 12/02/2021   AST 24 12/02/2021   NA 138 12/02/2021   K 3.9 12/02/2021   CL 106 12/02/2021   CREATININE 0.81 12/02/2021   BUN 12 12/02/2021   CO2 21 (L) 12/02/2021   TSH 0.693 12/02/2021   INR 1.0 10/25/2015   HGBA1C 6.4 10/27/2021   MICROALBUR 2.7 (H) 09/22/2020    Lab Results  Component Value Date   TSH 0.693 12/02/2021   Lab Results  Component Value Date   WBC 9.6 12/02/2021   HGB 13.9 12/02/2021   HCT 44.0 12/02/2021   MCV 98.4 12/02/2021   PLT 332 12/02/2021   Lab Results  Component Value Date   NA 138 12/02/2021   K 3.9 12/02/2021   CO2 21 (L) 12/02/2021   GLUCOSE 128 (H) 12/02/2021   BUN 12 12/02/2021   CREATININE 0.81 12/02/2021   BILITOT 0.9 12/02/2021   ALKPHOS 71 12/02/2021   AST 24 12/02/2021   ALT 20 12/02/2021   PROT 7.3 12/02/2021   ALBUMIN 4.3 12/02/2021   CALCIUM 9.7 12/02/2021   ANIONGAP 11 12/02/2021   GFR 55.12 (L) 10/23/2021   Lab Results  Component Value Date   CHOL 186 10/19/2021   Lab Results  Component Value Date   HDL 70.90 10/19/2021   Lab Results  Component Value Date   LDLCALC 87 10/19/2021   Lab Results  Component Value Date   TRIG 140.0 10/19/2021   Lab Results  Component Value Date   CHOLHDL 3 10/19/2021   Lab Results  Component Value Date   HGBA1C 6.4 10/27/2021       Assessment & Plan:   Problem List Items Addressed This Visit       Unprioritized   RESOLVED: Bipolar 1 disorder ([place])   Bipolar 2 disorder    Per psych       Chronic low back pain - Primary    Stable       Relevant Orders   Ambulatory referral to Physical Therapy   COPD (chronic obstructive pulmonary disease)    con't albuterol prm       Essential (primary) hypertension    Well controlled, no changes to meds. Encouraged heart healthy diet such as the DASH diet and exercise as tolerated.       Hyperlipidemia associated with type 2 diabetes mellitus ([place])    Encourage heart healthy diet such as MIND or DASH diet, increase exercise, avoid trans fats, simple carbohydrates and processed foods, consider a krill or fish or flaxseed oil cap daily.          No orders of the defined types were placed in this encounter.  IAnn Held, DO, personally preformed the services described in this documentation.  All medical record entries made by the scribe were at my direction and in my presence.  I have reviewed the chart and discharge instructions (if applicable) and agree that the record reflects my personal performance and is accurate and complete. 01/22/2022   I,Amber Collins,acting as a scribe for [employer], DO.,have documented all relevant documentation on the behalf of Ann Held, DO,as directed by  Ann Held, DO while in the presence of Ann Held, DO.    Ann Held, DO

## 2022-01-22 NOTE — Assessment & Plan Note (Signed)
Well controlled, no changes to meds. Encouraged heart healthy diet such as the DASH diet and exercise as tolerated.  °

## 2022-01-22 NOTE — Assessment & Plan Note (Signed)
Stable

## 2022-01-25 ENCOUNTER — Ambulatory Visit: Payer: Medicare Other

## 2022-01-25 NOTE — Chronic Care Management (AMB) (Signed)
?Chronic Care Management  ? ?CCM RN Visit Note ? ?01/25/2022 ?Name: Megan Duncan MRN: [id number] DOB: Mar 18, 1946 ? ?Subjective: ?Megan Duncan is a 76 y.o. year old female who is a primary care patient of Ann Held, DO. The care management team was consulted for assistance with disease management and care coordination needs.   ? ?Engaged with patient by telephone for follow up visit in response to provider referral for case management and/or care coordination services.  ? ?Consent to Services:  ?The patient was given information about Chronic Care Management services, agreed to services, and gave verbal consent prior to initiation of services.  Please see initial visit note for detailed documentation.  ? ?Patient agreed to services and verbal consent obtained.  ? ?Assessment: Review of patient past medical history, allergies, medications, health status, including review of consultants reports, laboratory and other test data, was performed as part of comprehensive evaluation and provision of chronic care management services.  ? ?SDOH (Social Determinants of Health) assessments and interventions performed:   ? ?CCM Care Plan ? ?Allergies  ?Allergen Reactions  ? Atorvastatin Other (See Comments)  ?  Significant rise in liver tests  ? Codeine Nausea And Vomiting  ? Diclofenac Other (See Comments)  ?  Elevated LFT's  ? Doxycycline   ?  Extreme nausea  ? Sulfonamide Derivatives Swelling  ? ? ?Outpatient Encounter Medications as of 01/25/2022  ?Medication Sig  ? albuterol (VENTOLIN HFA) 108 (90 Base) MCG/ACT inhaler Inhale 2 puffs into the lungs every 4 (four) hours as needed. For shortness of breath and wheezing  ? donepezil (ARICEPT) 5 MG tablet Take 1 tablet (5 mg total) by mouth at bedtime.  ? DULoxetine (CYMBALTA) 60 MG capsule Take 1 capsule (60 mg total) by mouth daily.  ? haloperidol (HALDOL) 1 MG tablet Take 1 tablet (1 mg total) by mouth 2 (two) times daily at 8 am and 4 pm.  ? levothyroxine  (SYNTHROID) 50 MCG tablet TAKE 1 TABLET BY MOUTH EVERY DAY BEFORE BREAKFAST  ? lidocaine (LIDODERM) 5 % Place 1 patch onto the skin daily as needed. Remove & Discard patch within 12 hours or as directed by MD  ? LORazepam (ATIVAN) 1 MG tablet Take 1 tablet (1 mg total) by mouth at bedtime.  ? metFORMIN (GLUCOPHAGE) 1000 MG tablet Take 1 tablet (1,000 mg total) by mouth 2 (two) times daily with a meal.  ? ondansetron (ZOFRAN) 4 MG tablet Take 1 tablet (4 mg total) by mouth every 8 (eight) hours as needed for nausea or vomiting.  ? prazosin (MINIPRESS) 2 MG capsule Take 1 capsule (2 mg total) by mouth at bedtime.  ? propranolol (INDERAL) 20 MG tablet Take 1 tablet (20 mg total) by mouth 2 (two) times daily with a meal.  ? QUEtiapine (SEROQUEL) 200 MG tablet Take 1 tablet (200 mg total) by mouth at bedtime.  ? Aspirin-Caffeine (BAYER BACK & BODY) 500-32.5 MG TABS Take 1 tablet by mouth daily as needed. Reports takes 1-2 as needed (Patient not taking: Reported on 01/25/2022)  ? blood glucose meter kit and supplies Dispense based on patient and insurance preference. Use once a day.  DX Code: E11.9  ? glucose blood test strip Use as instructed once a day.  Dx Code: E11.9  ? hydrocortisone-pramoxine (ANALPRAM HC) 2.5-1 % rectal cream Place 1 application rectally 3 (three) times daily. (Patient not taking: Reported on 01/25/2022)  ? Lancets MISC Use as directed once a day.  Dx code: E11.9  ? ?  No facility-administered encounter medications on file as of 01/25/2022.  ? ? ?Patient Active Problem List  ? Diagnosis Date Noted  ? Altered mental status 12/03/2021  ? Anxiety 11/28/2021  ? Memory loss 10/26/2021  ? Bilateral hearing loss 10/26/2021  ? 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  ? Degenerative lumbar spinal stenosis   ? Anterolisthesis of lumbar spine   ? Lump in upper inner quadrant of left breast 07/13/2021  ? Left flank pain 07/13/2021  ? Lumbar radiculopathy 01/26/2021  ? Body mass index  (BMI) 36.0-36.9, adult 01/11/2021  ? Degeneration of lumbar intervertebral disc 08/22/2020  ? Retrolisthesis of vertebrae 08/22/2020  ? Foraminal stenosis of lumbar region 08/22/2020  ? Generalized anxiety disorder 09/01/2018  ? Type II diabetes mellitus 06/05/2018  ? Tinea corporis 09/01/2017  ? Left upper quadrant pain 09/01/2017  ? Estrogen deficiency 09/01/2017  ? Thoracic aortic atherosclerosis 05/14/2017  ? COPD (chronic obstructive pulmonary disease) 10/30/2016  ? Allergic rhinitis 10/03/2016  ? Palpitations 07/20/2016  ? DOE (dyspnea on exertion) 07/20/2016  ? External hemorrhoid 08/01/2015  ? Stress incontinence, female 08/01/2015  ? Dermatitis 06/02/2015  ? Shingles outbreak 04/14/2015  ? Hyperlipidemia associated with type 2 diabetes mellitus ([place])   ? Fibromyalgia 10/14/2014  ? Bilateral hip pain 09/02/2014  ? Deformity of finger 07/15/2014  ? Ceruminosis 03/04/2014  ? Tinnitus 03/04/2014  ? Torticollis 03/04/2014  ? Bipolar 2 disorder 12/03/2013  ? 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-sided sciatica 08/06/2012  ? Lower abdominal pain 08/06/2012  ? Otitis media 09/05/2010  ? Peripheral neuropathy 02/22/2010  ? Lumbar back pain with radiculopathy affecting left lower extremity 01/24/2010  ? Cervicalgia 06/20/2007  ? Chronic low back pain 06/20/2007  ? Essential (primary) hypertension 04/23/2007  ? Inflammatory disease of uterus 04/23/2007  ? ? ?Conditions to be addressed/monitored:HTN, DMII, and Mental Health ? ?Care Plan : RN Care Manager Plan of Care  ?Updates made by Luretha Rued, RN since 01/25/2022 12:00 AM  ?  ? ?Problem: No plan of Care Established for Managment of [place] Disease   ?Priority: High  ?  ? ?Long-Range Goal: Developement of Plan of Care for Chronic Disease Management and/or care coordination needs   ?Start Date: 11/07/2021  ?Expected End Date: 05/08/2022  ?Priority: High  ?Note:   ?Current Barriers: 76 year old with history of HTN; DM; bilateral hearing loss-upcoming appointment  with audiologist; memory loss, Bipolar/Generalized Anxiety Disorder-managed by psychiatry. Recent behavior health admission (12/03/21-12/22/21)-medications adjusted. RNCM called and spoke with daughter, Geraldo Pitter), who reports Mrs. Donaghy is better. She reports Mrs. Overturf continues to live at home with her husband and spends a lot of time in her bed. However she reports Mrs. Culbertson is active with [facility] home health physical therapy and is getting up to walk a little more. They are in the process of getting an in-home aide to assist Mrs. Gienger with bathing (private pay). She also reports plans to return to water therapy once approved. Per Mrs. Delbert Phenix, Mrs. Strickland is currently out getting a hair cut which has not happened since before her hospitalization. Blood sugars not checked. But overall, per Mrs. Delbert Phenix, Mrs. Kertesz "is doing good". ?Knowledge Deficits related to plan of care for management of HTN, DMII, and decreased mobility, chronic pain  ?Chronic Disease Management support and education needs related to HTN, DMII, and chronic pain ?Chronic low back pain limiting mobility ?Does not routinely check blood sugar levels ?Does not have blood pressure monitor ?Mental health challenges(followed  by Psychiatry) ? ?RNCM Clinical Goal(s):  ?Patient will verbalize understanding of plan for management of HTN and DMII as evidenced by self report, caregiver report, chart notiation ?take all medications exactly as prescribed and will call provider for medication related questions as evidenced by sefl report, caregiver report, chart notation    ?demonstrate improved health management independence as evidenced by report of health management       through collaboration with RN Care manager, provider, and care team.  ? ?Interventions: ?1:1 collaboration with primary care provider regarding development and update of comprehensive plan of care as evidenced by provider attestation and co-signature ?Inter-disciplinary  care team collaboration (see longitudinal plan of care) ?Evaluation of current treatment plan related to  self management and patient's adherence to plan as established by provider ?Positive feedback prov

## 2022-01-25 NOTE — Patient Instructions (Signed)
Visit Information ? ?Thank you for taking time to visit with me today. Please don't hesitate to contact me if I can be of assistance to you before our next scheduled telephone appointment. ? ?Following are the goals we discussed today:  ?Patient Goals/Self-Care Activities: ?Take medications as prescribed   ?Attend all scheduled provider appointments ?Begin to check blood sugar per provider recommendation. Goal: fasting blood sugar 80-130 and <180 after a meal ?Continue to work with home health agency and perform exercises as recommended ?Begin to take and record blood pressure and take to provider office appointments ?Eat Healthy: foods with whole grains, fruits and vegetables, lean meats, healthy fats. Monitor salt intake and avoid sugar in diet. ?Contact Provider with any health questions or concerns ? ?Our next appointment is by telephone on 02/27/22 at 3:00 pm ? ?Please call the care guide team at [phone] if you need to cancel or reschedule your appointment.  ? ?If you are experiencing a Mental Health or [place] or need someone to talk to, please call the Suicide and Crisis Lifeline: 988 ?call 1-800-273-TALK (toll free, 24 hour hotline)  ? ?Patient verbalizes understanding of instructions and care plan provided today and agrees to view in [place]. Active MyChart status confirmed with patient.   ? ?Thea Silversmith, RN, MSN, BSN, CCM ?Care Management Coordinator ?[facility] [place] [facility] ?[phone]  ?

## 2022-01-26 ENCOUNTER — Telehealth: Payer: Self-pay | Admitting: Family Medicine

## 2022-01-26 NOTE — Telephone Encounter (Signed)
Physical therapist from [facility] called to make [place] aware that patient missed their 03/16 physical therapy appointment.  ?

## 2022-01-26 NOTE — Telephone Encounter (Signed)
Noted  

## 2022-01-31 DIAGNOSIS — M4316 Spondylolisthesis, lumbar region: Secondary | ICD-10-CM | POA: Diagnosis not present

## 2022-01-31 DIAGNOSIS — M48061 Spinal stenosis, lumbar region without neurogenic claudication: Secondary | ICD-10-CM | POA: Diagnosis not present

## 2022-01-31 DIAGNOSIS — G8929 Other chronic pain: Secondary | ICD-10-CM | POA: Diagnosis not present

## 2022-01-31 DIAGNOSIS — M5116 Intervertebral disc disorders with radiculopathy, lumbar region: Secondary | ICD-10-CM | POA: Diagnosis not present

## 2022-01-31 DIAGNOSIS — L728 Other follicular cysts of the skin and subcutaneous tissue: Secondary | ICD-10-CM | POA: Diagnosis not present

## 2022-01-31 DIAGNOSIS — M5432 Sciatica, left side: Secondary | ICD-10-CM | POA: Diagnosis not present

## 2022-02-01 ENCOUNTER — Ambulatory Visit: Payer: Medicare Other | Admitting: *Deleted

## 2022-02-01 DIAGNOSIS — M797 Fibromyalgia: Secondary | ICD-10-CM

## 2022-02-01 DIAGNOSIS — F319 Bipolar disorder, unspecified: Secondary | ICD-10-CM

## 2022-02-01 DIAGNOSIS — J449 Chronic obstructive pulmonary disease, unspecified: Secondary | ICD-10-CM

## 2022-02-01 DIAGNOSIS — N719 Inflammatory disease of uterus, unspecified: Secondary | ICD-10-CM

## 2022-02-01 DIAGNOSIS — E1151 Type 2 diabetes mellitus with diabetic peripheral angiopathy without gangrene: Secondary | ICD-10-CM

## 2022-02-01 DIAGNOSIS — F3181 Bipolar II disorder: Secondary | ICD-10-CM

## 2022-02-01 DIAGNOSIS — F0670 Mild neurocognitive disorder due to known physiological condition without behavioral disturbance: Secondary | ICD-10-CM

## 2022-02-01 DIAGNOSIS — G8929 Other chronic pain: Secondary | ICD-10-CM

## 2022-02-01 DIAGNOSIS — G609 Hereditary and idiopathic neuropathy, unspecified: Secondary | ICD-10-CM

## 2022-02-01 NOTE — Chronic Care Management (AMB) (Signed)
?Chronic Care Management  ? ? Clinical Social Work Note ? ?02/01/2022 ?Name: Megan Duncan MRN: [id number] DOB: 1946/06/01 ? ?Megan Duncan is a 76 y.o. year old female who is a primary care patient of Ann Held, DO. The CCM team was consulted to assist the patient with chronic disease management and/or care coordination needs related to: [employer], Level of Care Concerns, and Caregiver Stress.  ? ?Engaged with patient's daughter by telephone for follow up visit in response to provider referral for social work chronic care management and care coordination services.  ? ?Consent to Services:  ?The patient was given information about Chronic Care Management services, agreed to services, and gave verbal consent prior to initiation of services.  Please see initial visit note for detailed documentation.  ? ?Patient agreed to services and consent obtained.  ? ?Assessment: Review of patient past medical history, allergies, medications, and health status, including review of relevant consultants reports was performed today as part of a comprehensive evaluation and provision of chronic care management and care coordination services.    ? ?SDOH (Social Determinants of Health) assessments and interventions performed:   ? ?Advanced Directives Status: Not addressed in this encounter. ? ?CCM Care Plan ? ?Allergies  ?Allergen Reactions  ? Atorvastatin Other (See Comments)  ?  Significant rise in liver tests  ? Codeine Nausea And Vomiting  ? Diclofenac Other (See Comments)  ?  Elevated LFT's  ? Doxycycline   ?  Extreme nausea  ? Sulfonamide Derivatives Swelling  ? ? ?Outpatient Encounter Medications as of 02/01/2022  ?Medication Sig  ? albuterol (VENTOLIN HFA) 108 (90 Base) MCG/ACT inhaler Inhale 2 puffs into the lungs every 4 (four) hours as needed. For shortness of breath and wheezing  ? Aspirin-Caffeine (BAYER BACK & BODY) 500-32.5 MG TABS Take 1 tablet by mouth daily as needed. Reports takes 1-2 as  needed (Patient not taking: Reported on 01/25/2022)  ? blood glucose meter kit and supplies Dispense based on patient and insurance preference. Use once a day.  DX Code: E11.9  ? donepezil (ARICEPT) 5 MG tablet Take 1 tablet (5 mg total) by mouth at bedtime.  ? DULoxetine (CYMBALTA) 60 MG capsule Take 1 capsule (60 mg total) by mouth daily.  ? glucose blood test strip Use as instructed once a day.  Dx Code: E11.9  ? haloperidol (HALDOL) 1 MG tablet Take 1 tablet (1 mg total) by mouth 2 (two) times daily at 8 am and 4 pm.  ? hydrocortisone-pramoxine (ANALPRAM HC) 2.5-1 % rectal cream Place 1 application rectally 3 (three) times daily. (Patient not taking: Reported on 01/25/2022)  ? Lancets MISC Use as directed once a day.  Dx code: E11.9  ? levothyroxine (SYNTHROID) 50 MCG tablet TAKE 1 TABLET BY MOUTH EVERY DAY BEFORE BREAKFAST  ? lidocaine (LIDODERM) 5 % Place 1 patch onto the skin daily as needed. Remove & Discard patch within 12 hours or as directed by MD  ? LORazepam (ATIVAN) 1 MG tablet Take 1 tablet (1 mg total) by mouth at bedtime.  ? metFORMIN (GLUCOPHAGE) 1000 MG tablet Take 1 tablet (1,000 mg total) by mouth 2 (two) times daily with a meal.  ? ondansetron (ZOFRAN) 4 MG tablet Take 1 tablet (4 mg total) by mouth every 8 (eight) hours as needed for nausea or vomiting.  ? prazosin (MINIPRESS) 2 MG capsule Take 1 capsule (2 mg total) by mouth at bedtime.  ? propranolol (INDERAL) 20 MG tablet Take 1 tablet (20  mg total) by mouth 2 (two) times daily with a meal.  ? QUEtiapine (SEROQUEL) 200 MG tablet Take 1 tablet (200 mg total) by mouth at bedtime.  ? ?No facility-administered encounter medications on file as of 02/01/2022.  ? ? ?Patient Active Problem List  ? Diagnosis Date Noted  ? Altered mental status 12/03/2021  ? Anxiety 11/28/2021  ? Memory loss 10/26/2021  ? Bilateral hearing loss 10/26/2021  ? 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  ? Degenerative lumbar spinal stenosis   ?  Anterolisthesis of lumbar spine   ? Lump in upper inner quadrant of left breast 07/13/2021  ? Left flank pain 07/13/2021  ? Lumbar radiculopathy 01/26/2021  ? Body mass index (BMI) 36.0-36.9, adult 01/11/2021  ? Degeneration of lumbar intervertebral disc 08/22/2020  ? Retrolisthesis of vertebrae 08/22/2020  ? Foraminal stenosis of lumbar region 08/22/2020  ? Generalized anxiety disorder 09/01/2018  ? Type II diabetes mellitus 06/05/2018  ? Tinea corporis 09/01/2017  ? Left upper quadrant pain 09/01/2017  ? Estrogen deficiency 09/01/2017  ? Thoracic aortic atherosclerosis 05/14/2017  ? COPD (chronic obstructive pulmonary disease) 10/30/2016  ? Allergic rhinitis 10/03/2016  ? Palpitations 07/20/2016  ? DOE (dyspnea on exertion) 07/20/2016  ? External hemorrhoid 08/01/2015  ? Stress incontinence, female 08/01/2015  ? Dermatitis 06/02/2015  ? Shingles outbreak 04/14/2015  ? Hyperlipidemia associated with type 2 diabetes mellitus ([place])   ? Fibromyalgia 10/14/2014  ? Bilateral hip pain 09/02/2014  ? Deformity of finger 07/15/2014  ? Ceruminosis 03/04/2014  ? Tinnitus 03/04/2014  ? Torticollis 03/04/2014  ? Bipolar 2 disorder 12/03/2013  ? 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-sided sciatica 08/06/2012  ? Lower abdominal pain 08/06/2012  ? Otitis media 09/05/2010  ? Peripheral neuropathy 02/22/2010  ? Lumbar back pain with radiculopathy affecting left lower extremity 01/24/2010  ? Cervicalgia 06/20/2007  ? Chronic low back pain 06/20/2007  ? Essential (primary) hypertension 04/23/2007  ? Inflammatory disease of uterus 04/23/2007  ? ? ?Conditions to be addressed/monitored:  Thoracic Aortic Atherosclerosis, Essential Hypertension, Chronic Obstructive Pulmonary Disease, Type II Diabetes Mellitus, Mild Neurocognitive Disorder, Bilateral Hearing Loss, Memory Loss, Fibromyalgia, Bipolar II Disorder, Generalized Anxiety Disorder.  Level of Care Concerns, ADL/IADL Limitations, Mental Health Concerns, Limited Access to Caregiver,  Cognitive Deficits, Memory Deficits, and Lacks Knowledge of [employer]. ? ?Care Plan : LCSW Plan of Care  ?Updates made by Francis Gaines, LCSW since 02/01/2022 12:00 AM  ?  ? ?Problem: Improve Quality of Life through [facility].   ?Priority: High  ?  ? ?Goal: Improve Quality of Life through [facility].   ?Start Date: 11/27/2021  ?Expected End Date: 03/27/2022  ?This Visit's Progress: On track  ?Recent Progress: On track  ?Priority: High  ?Note:   ?Current Barriers:   ?Patient with Thoracic Aortic Atherosclerosis, Essential Hypertension, Chronic Obstructive Pulmonary Disease, Type II Diabetes Mellitus, Mild Neurocognitive Disorder, Bilateral Hearing Loss, Memory Loss, Fibromyalgia, Bipolar II Disorder, Generalized Anxiety Disorder, and Caregiver Stress needs Support, Education, Resources, Referrals, Advocacy, and Care Coordination to resolve unmet personal care needs in the home. ?Patient is unable to self-administer medications as prescribed, or consistently perform ADL's/IADL's independently. ?Lacks knowledge of available community agencies and resources. ?Clinical Goals:  ?Patient, husband, and daughter will work with LCSW, in an effort to coordinate in-home care services, through [employer].   ?Patient will demonstrate improved health management independence, as evidenced by having in-home care services in place. ?Interventions: ?Collaboration with Primary Care  Physician, Dr. Roma Schanz, regarding development and update of comprehensive plan of care, as evidenced by provider attestation and co-signature. ?Collaboration with Primary Care Physician, Dr. Roma Schanz, to report patient and daughter's request for patient to resume aquatic therapy, and to obtain medical clearance. ?Inter-disciplinary care team collaboration (see longitudinal plan of care). ?Clinical Interventions: ?Problem Solving/Task-Centered Solutions Developed. ?Caregiver Stress  Acknowledged/Empathetic Listening Provided. ?Emotional Support Provided/Verbalization of Feelings Discussed. ?Patient Goals/Self-Care Activities:  ?Continue to receive bi-weekly social work services from

## 2022-02-01 NOTE — Patient Instructions (Signed)
Visit Information ? ?Thank you for taking time to visit with me today. Please don't hesitate to contact me if I can be of assistance to you before our next scheduled telephone appointment. ? ?Following are the goals we discussed today:  ?Patient Goals/Self-Care Activities:  ?Continue to receive bi-weekly social work services from [facility], in an effort to coordinate hours of in-home care services, through long-term care insurance policy. ?Collaboration with daughter, Pincus Large to confirm that you have terminated [place], through [facility] [phone]). ?Collaboration with representative from [facility] [phone]), to confirm order received for aquatic therapies, as well as initial date of service:  01/09/2022 at 10:00 am. ?Keep routine follow-up appointments with Donnal Moat, Psychiatric Physician Assistant - Certified with [facility] [phone]), for psychotropic medication administration/management and psychotherapeutic counseling services. ?Contact LCSW directly (# [phone]), if you have questions, need assistance, or if additional social work needs are identified between now and our next scheduled telephone outreach call.  ?Follow-Up Plan:  02/22/2022 at 3:15 pm ? ?Please call the care guide team at [phone] if you need to cancel or reschedule your appointment.  ? ?If you are experiencing a Mental Health or [place] or need someone to talk to, please call the Suicide and Crisis Lifeline: 988 ?call the [place] National Suicide Prevention Lifeline: 307-785-4426 or TTY: (279)257-9267 TTY 519-567-2736) to talk to a trained counselor ?call 1-800-273-TALK (toll free, 24 hour hotline) ?go to [facility] [address] [place] [phone]) ?call the [facility]: [phone] ?call 911  ? ?Patient verbalizes understanding of instructions  and care plan provided today and agrees to view in [place]. Active MyChart status confirmed with patient.   ? ?Nat Christen LCSW ?Licensed Clinical Social Worker ?[place] ?[phone]  ?

## 2022-02-02 DIAGNOSIS — M5432 Sciatica, left side: Secondary | ICD-10-CM | POA: Diagnosis not present

## 2022-02-02 DIAGNOSIS — G8929 Other chronic pain: Secondary | ICD-10-CM | POA: Diagnosis not present

## 2022-02-02 DIAGNOSIS — M5116 Intervertebral disc disorders with radiculopathy, lumbar region: Secondary | ICD-10-CM | POA: Diagnosis not present

## 2022-02-02 DIAGNOSIS — L728 Other follicular cysts of the skin and subcutaneous tissue: Secondary | ICD-10-CM | POA: Diagnosis not present

## 2022-02-02 DIAGNOSIS — M4316 Spondylolisthesis, lumbar region: Secondary | ICD-10-CM | POA: Diagnosis not present

## 2022-02-02 DIAGNOSIS — M48061 Spinal stenosis, lumbar region without neurogenic claudication: Secondary | ICD-10-CM | POA: Diagnosis not present

## 2022-02-03 ENCOUNTER — Other Ambulatory Visit: Payer: Self-pay | Admitting: Physician Assistant

## 2022-02-06 DIAGNOSIS — R2689 Other abnormalities of gait and mobility: Secondary | ICD-10-CM | POA: Diagnosis not present

## 2022-02-06 DIAGNOSIS — M545 Low back pain, unspecified: Secondary | ICD-10-CM | POA: Diagnosis not present

## 2022-02-07 ENCOUNTER — Telehealth: Payer: Self-pay | Admitting: Family Medicine

## 2022-02-07 NOTE — Telephone Encounter (Signed)
Pt's daughter stated albuterol just needs to be on a call when needed basis. ?

## 2022-02-07 NOTE — Telephone Encounter (Signed)
Pt would like for [place] to contact her to discuss medications. ? ?Pt states she's been receiving albuterol treatments more often then normal. ? ?Please advise.  ?

## 2022-02-08 DIAGNOSIS — Z961 Presence of intraocular lens: Secondary | ICD-10-CM | POA: Diagnosis not present

## 2022-02-08 DIAGNOSIS — H02403 Unspecified ptosis of bilateral eyelids: Secondary | ICD-10-CM | POA: Diagnosis not present

## 2022-02-08 DIAGNOSIS — H2511 Age-related nuclear cataract, right eye: Secondary | ICD-10-CM | POA: Diagnosis not present

## 2022-02-08 DIAGNOSIS — H35342 Macular cyst, hole, or pseudohole, left eye: Secondary | ICD-10-CM | POA: Diagnosis not present

## 2022-02-09 ENCOUNTER — Encounter: Payer: Medicare Other | Admitting: Psychology

## 2022-02-09 DIAGNOSIS — I10 Essential (primary) hypertension: Secondary | ICD-10-CM | POA: Diagnosis not present

## 2022-02-09 DIAGNOSIS — E1159 Type 2 diabetes mellitus with other circulatory complications: Secondary | ICD-10-CM

## 2022-02-09 DIAGNOSIS — Z7984 Long term (current) use of oral hypoglycemic drugs: Secondary | ICD-10-CM

## 2022-02-09 NOTE — Telephone Encounter (Signed)
LVM with patient's daughter. ?

## 2022-02-09 NOTE — Telephone Encounter (Signed)
Please advise. Was this something that was discussed? ?

## 2022-02-12 ENCOUNTER — Other Ambulatory Visit: Payer: Self-pay | Admitting: Family Medicine

## 2022-02-13 ENCOUNTER — Encounter: Payer: Self-pay | Admitting: Physician Assistant

## 2022-02-13 ENCOUNTER — Ambulatory Visit (INDEPENDENT_AMBULATORY_CARE_PROVIDER_SITE_OTHER): Payer: Medicare Other | Admitting: Physician Assistant

## 2022-02-13 VITALS — BP 135/82 | HR 61

## 2022-02-13 DIAGNOSIS — F3181 Bipolar II disorder: Secondary | ICD-10-CM

## 2022-02-13 DIAGNOSIS — G2401 Drug induced subacute dyskinesia: Secondary | ICD-10-CM | POA: Diagnosis not present

## 2022-02-13 DIAGNOSIS — H9190 Unspecified hearing loss, unspecified ear: Secondary | ICD-10-CM

## 2022-02-13 DIAGNOSIS — F4310 Post-traumatic stress disorder, unspecified: Secondary | ICD-10-CM | POA: Diagnosis not present

## 2022-02-13 DIAGNOSIS — F411 Generalized anxiety disorder: Secondary | ICD-10-CM | POA: Diagnosis not present

## 2022-02-13 MED ORDER — PROPRANOLOL HCL 20 MG PO TABS: 20.0000 mg | ORAL_TABLET | Freq: Every day | ORAL | 2 refills | Status: DC

## 2022-02-13 NOTE — Patient Instructions (Signed)
The tongue movements might be tardive dyskinesia which is a movement disorder that can be caused from antipsychotics, Haldol and Seroquel in your case.  For now we are NOT changing any medications for mental health.  We will continue to observe.  If the movements get worse, example would be biting your tongue or inside of your cheek to the point that you have trouble eating then we may need to consider treatment.  Ingrezza or Austedo are specific medications for tardive dyskinesia. ? ?The propranolol has been for the tremor that was likely caused by lithium.  Since you're no longer on Lithium, we will decrease the propranolol 20 mg from 1 pill twice a day down to 1 pill once a day.  If the tremor recurs or anxiety increases then you can go ahead and increase the propranolol back to 1 twice a day.  Otherwise stay on 1 pill a day until we see each other. ?

## 2022-02-13 NOTE — Progress Notes (Signed)
[facility] ? ?Patient ID: Megan Duncan,  ?MRN: [id number] ? ?PCP: Ann Held, DO ? ?Date of Evaluation: 02/13/2022  ?time spent:30 minutes ? ?Chief Complaint:  ?Chief Complaint   ?Follow-up ?  ? ? ? ?HISTORY/CURRENT STATUS: ?HPI For 1 month follow-up.  Accompanied by her husband 67. ? ?She has been doing great as far as her mental health goes.  She was last seen 1 month ago after having a rough couple of months including a hospitalization for delirium.  Attempt to decrease Haldol did not go well, the altered mental status returned so all medications were left the same. ? ?A new problem is what she describes as "sucking her bottom lip with her tongue."  It started a few weeks ago.  Her daughter is the one who pointed it out to her.  [name] states he has noticed it, after it was pointed out. [name] is having no other abnormal mouth movements, not chewing on her tongue, no abnormal truncal movements or other signs of tardive dyskinesia. ? ?Patient still has chronic back pain which limits her from doing a lot of things she used to enjoy.  But she does get out of bed more often and does not seem as depressed.  Energy and motivation have improved some.  ADLs and personal hygiene are better although housework and the like is limited.  Still isolates.  Does not cry easily.  Sleeps well, no nightmares but does still have vivid dreams. No sx of RLS that are disturbing. Anxiety is well controlled.  No suicidal or homicidal thoughts. ? ?Patient denies increased energy with decreased need for sleep, no increased talkativeness, no racing thoughts, no impulsivity or risky behaviors, no increased spending, no increased libido, no grandiosity, no increased irritability or anger, no paranoia, and no hallucinations. ? ?Denies dizziness, syncope, seizures, numbness, tingling, tremor, tics, unsteady gait, slurred speech, confusion. Denies muscle or joint pain, stiffness, or dystonia. Denies unexplained  weight loss, frequent infections, or sores that heal slowly.  No polyphagia, polydipsia, or polyuria. Denies visual changes or paresthesias.  ? ?Individual Medical History/ Review of Systems: Changes? :No   ? ?Past medications for mental health diagnoses include: ?Trileptal Lamictal Prozac Zoloft Effexor XR, Cymbalta, Paxil, lithium, Depakote, Latuda, Risperdal, Tegretol, Wellbutrin, propranolol for tremor, Thorazine  Haldol and Zyprexa given for a few days inpatient January 2023 ? ?Hospitalized in January 2023 at [facility] behavioral health for altered mental status with hallucinations, paranoia, and aggressive behavior ? ?Allergies: Atorvastatin, Codeine, Diclofenac, Doxycycline, and Sulfonamide derivatives ? ?Current Medications:  ?Current Outpatient Medications:  ?  albuterol (VENTOLIN HFA) 108 (90 Base) MCG/ACT inhaler, Inhale 2 puffs into the lungs every 4 (four) hours as needed. For shortness of breath and wheezing, Disp: 18 g, Rfl: 3 ?  donepezil (ARICEPT) 5 MG tablet, Take 1 tablet (5 mg total) by mouth at bedtime., Disp: 30 tablet, Rfl: 2 ?  DULoxetine (CYMBALTA) 60 MG capsule, Take 1 capsule (60 mg total) by mouth daily., Disp: 30 capsule, Rfl: 2 ?  glucose blood test strip, Use as instructed once a day.  Dx Code: E11.9, Disp: 30 each, Rfl: 2 ?  haloperidol (HALDOL) 1 MG tablet, Take 1 tablet (1 mg total) by mouth 2 (two) times daily at 8 am and 4 pm., Disp: 60 tablet, Rfl: 2 ?  Lancets MISC, Use as directed once a day.  Dx code: E11.9, Disp: 30 each, Rfl: 2 ?  levothyroxine (SYNTHROID) 50 MCG tablet, TAKE 1 TABLET BY  MOUTH EVERY DAY BEFORE BREAKFAST, Disp: 90 tablet, Rfl: 1 ?  lidocaine (LIDODERM) 5 %, Place 1 patch onto the skin daily as needed. Remove & Discard patch within 12 hours or as directed by MD, Disp: 30 patch, Rfl: 0 ?  LORazepam (ATIVAN) 1 MG tablet, Take 1 tablet (1 mg total) by mouth at bedtime., Disp: 30 tablet, Rfl: 2 ?  metFORMIN (GLUCOPHAGE) 1000 MG tablet, Take 1 tablet (1,000  mg total) by mouth 2 (two) times daily with a meal., Disp: 60 tablet, Rfl: 2 ?  ondansetron (ZOFRAN) 4 MG tablet, Take 1 tablet (4 mg total) by mouth every 8 (eight) hours as needed for nausea or vomiting., Disp: 15 tablet, Rfl: 0 ?  QUEtiapine (SEROQUEL) 200 MG tablet, Take 1 tablet (200 mg total) by mouth at bedtime., Disp: 30 tablet, Rfl: 2 ?  Aspirin-Caffeine (BAYER BACK & BODY) 500-32.5 MG TABS, Take 1 tablet by mouth daily as needed. Reports takes 1-2 as needed (Patient not taking: Reported on 01/25/2022), Disp: , Rfl:  ?  blood glucose meter kit and supplies, Dispense based on patient and insurance preference. Use once a day.  DX Code: E11.9 (Patient not taking: Reported on 02/13/2022), Disp: 1 each, Rfl: 0 ?  hydrocortisone-pramoxine (ANALPRAM HC) 2.5-1 % rectal cream, Place 1 application rectally 3 (three) times daily. (Patient not taking: Reported on 01/25/2022), Disp: 30 g, Rfl: 3 ?  propranolol (INDERAL) 20 MG tablet, Take 1 tablet (20 mg total) by mouth daily., Disp: 30 tablet, Rfl: 2 ?Medication Side Effects: none ? ?Family Medical/ Social History: Changes? No ? ?MENTAL HEALTH EXAM: ? ?Blood pressure 135/82, pulse 61.There is no height or weight on file to calculate BMI.  ?General Appearance: Casual and Well Groomed  ?Eye Contact:  Good  ?Speech:  Clear and Coherent, Normal Rate, and Talkative  ?Volume:  Normal  ?Mood:  Euthymic  ?Affect:  Congruent  ?Thought Process:  Goal Directed and Descriptions of Associations: Circumstantial  ?Orientation:  Full (Time, Place, and Person)  ?Thought Content: Logical   ?Suicidal Thoughts:  No  ?Homicidal Thoughts:  No  ?Memory:  WNL  ?Judgement:  Good  ?Insight:  Good  ?Psychomotor Activity:   few times during our visit she appears to move her tongue to the front of lower teeth, pulling her lip inward    ?Concentration:  Concentration: Good and Attention Span: Good  ?Recall:  Good  ?Fund of Knowledge: Good  ?Language: Good  ?Assets:  Desire for Improvement  ?ADL's:   Intact  ?Cognition: WNL  ?Prognosis:  Good  ? ? ?DIAGNOSES:  ?  ICD-10-CM   ?1. Bipolar 2 disorder (HCC)  F31.81   ?  ?2. Generalized anxiety disorder  F41.1   ?  ?3. PTSD (post-traumatic stress disorder)  F43.10   ?  ?4. Hearing loss, unspecified hearing loss type, unspecified laterality  H91.90   ?  ?5. Tardive dyskinesia  G24.01   ?  ? ? ?Receiving Psychotherapy: No  ? ? ?RECOMMENDATIONS:  ?PDMP reviewed.  Ativan filled 01/18/2022.  Xanax filled 11/27/2021. ?I provided 30 minutes of face to face time during this encounter, including time spent before and after the visit in records review, medical decision making, counseling pertinent to today's visit, and charting.  ?She is on 2 different antipsychotics which does increase the risk of tardive dyskinesia.  I explained that to patient and her husband.  But she has done well on the current treatment and they do not want to mess that up  so are willing to accept that side effect, if that is what is causing it.  I agree because of the severity of delirium requiring hospitalization 2 months ago.  I explained tardive dyskinesia to them and possible treatment of Austedo or Ingrezza.  At this point we do not want to start another medication.  I did write this information out on the AVS so her daughter Almyra Free can see what we have discussed. ?The propranolol was given for tremor that was caused by lithium, we will decrease the propranolol and may even be able to stop it depending on how she does.  Those instructions were also on the AVS. ?No medication changes other than that. ? ?Continue Aricept 5 mg, 1 p.o. nightly. ?Continue Cymbalta 60 mg, 1 p.o. daily. ?Continue Haldol 1 mg, 1 p.o. twice daily. ?Continue Ativan 1 mg, 1 p.o. nightly. ?Decrease propranolol 20 mg to 1 p.o. daily.   ?Continue Seroquel 200 mg, 1 p.o. nightly. ?Return in 4 weeks. ? ?Donnal Moat, PA-C  ?

## 2022-02-15 ENCOUNTER — Encounter: Payer: Medicare Other | Admitting: Psychology

## 2022-02-19 DIAGNOSIS — R2689 Other abnormalities of gait and mobility: Secondary | ICD-10-CM | POA: Diagnosis not present

## 2022-02-19 DIAGNOSIS — M545 Low back pain, unspecified: Secondary | ICD-10-CM | POA: Diagnosis not present

## 2022-02-22 ENCOUNTER — Ambulatory Visit (INDEPENDENT_AMBULATORY_CARE_PROVIDER_SITE_OTHER): Payer: Medicare Other | Admitting: *Deleted

## 2022-02-22 DIAGNOSIS — M797 Fibromyalgia: Secondary | ICD-10-CM

## 2022-02-22 DIAGNOSIS — G609 Hereditary and idiopathic neuropathy, unspecified: Secondary | ICD-10-CM

## 2022-02-22 DIAGNOSIS — J449 Chronic obstructive pulmonary disease, unspecified: Secondary | ICD-10-CM

## 2022-02-22 DIAGNOSIS — E1151 Type 2 diabetes mellitus with diabetic peripheral angiopathy without gangrene: Secondary | ICD-10-CM

## 2022-02-22 DIAGNOSIS — F3181 Bipolar II disorder: Secondary | ICD-10-CM

## 2022-02-22 DIAGNOSIS — F0670 Mild neurocognitive disorder due to known physiological condition without behavioral disturbance: Secondary | ICD-10-CM

## 2022-02-23 NOTE — Patient Instructions (Signed)
Visit Information ? ?Thank you for taking time to visit with me today. Please don't hesitate to contact me if I can be of assistance to you before our next scheduled telephone appointment. ? ?Following are the goals we discussed today:  ?Patient Goals/Self-Care Activities:  ?Continue to receive aquatic outpatient therapy through [facility] [phone]).   ?Keep routine follow-up appointments with Donnal Moat, Psychiatric Physician Assistant - Certified with [facility] [phone]), for psychotropic medication administration/management and psychotherapeutic counseling services. ?Contact LCSW directly (# [phone]), if you have questions, need assistance, or if additional social work needs are identified in the near future. ?No Follow-Up Required. ? ?Please call the care guide team at [phone] if you need to cancel or reschedule your appointment.  ? ?If you are experiencing a Mental Health or [place] or need someone to talk to, please call the Suicide and Crisis Lifeline: 988 ?call the [place] National Suicide Prevention Lifeline: 347-663-3805 or TTY: 315-314-3918 TTY [phone]) to talk to a trained counselor ?call 1-800-273-TALK (toll free, 24 hour hotline) ?go to [facility] [address], [place] [phone]) ?call the [facility]: [phone] ?call 911  ? ?Patient verbalizes understanding of instructions and care plan provided today and agrees to view in [place]. Active MyChart status confirmed with patient.   ? ?Nat Christen LCSW ?Licensed Clinical Social Worker ?[place] ?[phone]  ?

## 2022-02-23 NOTE — Chronic Care Management (AMB) (Signed)
?Chronic Care Management  ? ? Clinical Social Work Note ? ?02/23/2022 ?Name: Megan Duncan MRN: [id number] DOB: 1946/04/01 ? ?Megan Duncan is a 76 y.o. year old female who is a primary care patient of Ann Held, DO. The CCM team was consulted to assist the patient with chronic disease management and/or care coordination needs related to: [employer] .  ? ?Engaged with patient by telephone for follow up visit in response to provider referral for social work chronic care management and care coordination services.  ? ?Consent to Services:  ?The patient was given information about Chronic Care Management services, agreed to services, and gave verbal consent prior to initiation of services.  Please see initial visit note for detailed documentation.  ? ?Patient agreed to services and consent obtained.  ? ?Assessment: Review of patient past medical history, allergies, medications, and health status, including review of relevant consultants reports was performed today as part of a comprehensive evaluation and provision of chronic care management and care coordination services.    ? ?SDOH (Social Determinants of Health) assessments and interventions performed:   ? ?Advanced Directives Status: Not addressed in this encounter. ? ?CCM Care Plan ? ?Allergies  ?Allergen Reactions  ? Atorvastatin Other (See Comments)  ?  Significant rise in liver tests  ? Codeine Nausea And Vomiting  ? Diclofenac Other (See Comments)  ?  Elevated LFT's  ? Doxycycline   ?  Extreme nausea  ? Sulfonamide Derivatives Swelling  ? ? ?Outpatient Encounter Medications as of 02/22/2022  ?Medication Sig  ? albuterol (VENTOLIN HFA) 108 (90 Base) MCG/ACT inhaler Inhale 2 puffs into the lungs every 4 (four) hours as needed. For shortness of breath and wheezing  ? Aspirin-Caffeine (BAYER BACK & BODY) 500-32.5 MG TABS Take 1 tablet by mouth daily as needed. Reports takes 1-2 as needed (Patient not taking: Reported on 01/25/2022)  ?  blood glucose meter kit and supplies Dispense based on patient and insurance preference. Use once a day.  DX Code: E11.9 (Patient not taking: Reported on 02/13/2022)  ? donepezil (ARICEPT) 5 MG tablet Take 1 tablet (5 mg total) by mouth at bedtime.  ? DULoxetine (CYMBALTA) 60 MG capsule Take 1 capsule (60 mg total) by mouth daily.  ? glucose blood test strip Use as instructed once a day.  Dx Code: E11.9  ? haloperidol (HALDOL) 1 MG tablet Take 1 tablet (1 mg total) by mouth 2 (two) times daily at 8 am and 4 pm.  ? hydrocortisone-pramoxine (ANALPRAM HC) 2.5-1 % rectal cream Place 1 application rectally 3 (three) times daily. (Patient not taking: Reported on 01/25/2022)  ? Lancets MISC Use as directed once a day.  Dx code: E11.9  ? levothyroxine (SYNTHROID) 50 MCG tablet TAKE 1 TABLET BY MOUTH EVERY DAY BEFORE BREAKFAST  ? lidocaine (LIDODERM) 5 % Place 1 patch onto the skin daily as needed. Remove & Discard patch within 12 hours or as directed by MD  ? LORazepam (ATIVAN) 1 MG tablet Take 1 tablet (1 mg total) by mouth at bedtime.  ? metFORMIN (GLUCOPHAGE) 1000 MG tablet Take 1 tablet (1,000 mg total) by mouth 2 (two) times daily with a meal.  ? ondansetron (ZOFRAN) 4 MG tablet Take 1 tablet (4 mg total) by mouth every 8 (eight) hours as needed for nausea or vomiting.  ? propranolol (INDERAL) 20 MG tablet Take 1 tablet (20 mg total) by mouth daily.  ? QUEtiapine (SEROQUEL) 200 MG tablet Take 1 tablet (200 mg total)  by mouth at bedtime.  ? ?No facility-administered encounter medications on file as of 02/22/2022.  ? ? ?Patient Active Problem List  ? Diagnosis Date Noted  ? Altered mental status 12/03/2021  ? Anxiety 11/28/2021  ? Memory loss 10/26/2021  ? Bilateral hearing loss 10/26/2021  ? 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  ? Degenerative lumbar spinal stenosis   ? Anterolisthesis of lumbar spine   ? Lump in upper inner quadrant of left breast 07/13/2021  ? Left flank pain 07/13/2021  ?  Lumbar radiculopathy 01/26/2021  ? Body mass index (BMI) 36.0-36.9, adult 01/11/2021  ? Degeneration of lumbar intervertebral disc 08/22/2020  ? Retrolisthesis of vertebrae 08/22/2020  ? Foraminal stenosis of lumbar region 08/22/2020  ? Generalized anxiety disorder 09/01/2018  ? Type II diabetes mellitus 06/05/2018  ? Tinea corporis 09/01/2017  ? Left upper quadrant pain 09/01/2017  ? Estrogen deficiency 09/01/2017  ? Thoracic aortic atherosclerosis 05/14/2017  ? COPD (chronic obstructive pulmonary disease) 10/30/2016  ? Allergic rhinitis 10/03/2016  ? Palpitations 07/20/2016  ? DOE (dyspnea on exertion) 07/20/2016  ? External hemorrhoid 08/01/2015  ? Stress incontinence, female 08/01/2015  ? Dermatitis 06/02/2015  ? Shingles outbreak 04/14/2015  ? Hyperlipidemia associated with type 2 diabetes mellitus ([place])   ? Fibromyalgia 10/14/2014  ? Bilateral hip pain 09/02/2014  ? Deformity of finger 07/15/2014  ? Ceruminosis 03/04/2014  ? Tinnitus 03/04/2014  ? Torticollis 03/04/2014  ? Bipolar 2 disorder 12/03/2013  ? 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-sided sciatica 08/06/2012  ? Lower abdominal pain 08/06/2012  ? Otitis media 09/05/2010  ? Peripheral neuropathy 02/22/2010  ? Lumbar back pain with radiculopathy affecting left lower extremity 01/24/2010  ? Cervicalgia 06/20/2007  ? Chronic low back pain 06/20/2007  ? Essential (primary) hypertension 04/23/2007  ? Inflammatory disease of uterus 04/23/2007  ? ? ?Conditions to be addressed/monitored: Anxiety, Altered Mental Status, and Memory Loss.  Limited Social Support, Level of Care Concerns, ADL/IADL Limitations, Mental Health Concerns, Limited Access to Caregiver, Cognitive Deficits, Memory Deficits, and Lacks Knowledge of [employer]. ? ?Care Plan : LCSW Plan of Care  ?Updates made by Francis Gaines, LCSW since 02/23/2022 12:00 AM  ?  ? ?Problem: Improve Quality of Life through [facility]. Resolved 02/22/2022  ?Priority: High  ?  ? ?Goal:  Improve Quality of Life through [facility]. Completed 02/22/2022  ?Start Date: 11/27/2021  ?Expected End Date: 02/22/2022  ?This Visit's Progress: On track  ?Recent Progress: On track  ?Priority: High  ?Note:   ?Current Barriers:   ?Patient with Thoracic Aortic Atherosclerosis, Essential Hypertension, Chronic Obstructive Pulmonary Disease, Type II Diabetes Mellitus, Mild Neurocognitive Disorder, Bilateral Hearing Loss, Memory Loss, Fibromyalgia, Bipolar II Disorder, Generalized Anxiety Disorder, and Caregiver Stress needs Support, Education, Resources, Referrals, Advocacy, and Care Coordination to resolve unmet personal care needs in the home. ?Patient is unable to self-administer medications as prescribed, or consistently perform ADL's/IADL's independently. ?Lacks knowledge of available community agencies and resources. ?Clinical Goals:  ?Patient, husband, and daughter will work with LCSW, in an effort to coordinate in-home care services, through [employer].   ?Patient will demonstrate improved health management independence, as evidenced by having in-home care services in place. ?Interventions: ?Collaboration with Primary Care Physician, Dr. Roma Schanz, regarding development and update of comprehensive plan of care, as evidenced by provider attestation and co-signature. ?Inter-disciplinary care team collaboration (see longitudinal plan of care). ?Clinical Interventions: ?Problem Solving/Task-Centered Solutions Developed. ?Caregiver Stress Acknowledged/Empathetic Listening Provided. ?  Emotional Support Provided/Verbalization of Feelings Discussed. ?Patient Goals/Self-Care Activities:  ?Continue to receive aquatic outpatient therapy through [facility] [phone]).   ?Keep routine follow-up appointments with Donnal Moat, Psychiatric Physician Assistant - Certified with [facility] [phone]), for psychotropic medication  administration/management and psychotherapeutic counseling services. ?Contact LCSW directly (# [phone]), if you have questions, need assistance, or if additional social work needs are identified in the near f

## 2022-02-27 ENCOUNTER — Ambulatory Visit: Payer: Medicare Other

## 2022-02-27 DIAGNOSIS — G8929 Other chronic pain: Secondary | ICD-10-CM

## 2022-02-27 DIAGNOSIS — I10 Essential (primary) hypertension: Secondary | ICD-10-CM

## 2022-02-27 NOTE — Patient Instructions (Signed)
Visit Information ? ?Thank you for taking time to visit with me today. Please don't hesitate to contact me if I can be of assistance to you before our next scheduled telephone appointment. ? ?Following are the goals we discussed today:  ?Patient Goals/Self-Care Activities: ?Take medications as prescribed   ?Attend all scheduled provider appointments ?Begin to check blood sugar per provider recommendation. Goal: fasting blood sugar 80-130 and <180 after a meal ?Consider checking and recording blood pressure and take to provider office appointments. ?Eat Healthy: foods with whole grains, fruits and vegetables, lean meats, healthy fats. Monitor salt intake and avoid sugar in diet. ?Contact Provider with any health questions or concerns ? ?Our next appointment is by telephone on 05/17/22 at 3:00 pm ? ?Please call the care guide team at [phone] if you need to cancel or reschedule your appointment.  ? ?If you are experiencing a Mental Health or [place] or need someone to talk to, please call the Suicide and Crisis Lifeline: 988 ?call 1-800-273-TALK (toll free, 24 hour hotline)  ? ?Patient verbalizes understanding of instructions and care plan provided today and agrees to view in [place] [place]. Active MyChart status confirmed with patient.   ? ?Thea Silversmith, RN, MSN, BSN, CCM ?Care Management Coordinator ?[facility] ?[phone]  ?

## 2022-02-27 NOTE — Chronic Care Management (AMB) (Signed)
?Chronic Care Management  ? ?CCM RN Visit Note ? ?02/27/2022 ?Name: ZELA SOBIESKI MRN: [id number] DOB: 08-19-1946 ? ?Subjective: ?KIDADA GING is a 76 y.o. year old female who is a primary care patient of Ann Held, DO. The care management team was consulted for assistance with disease management and care coordination needs.   ? ?Engaged with patient by telephone for follow up visit in response to provider referral for case management and/or care coordination services.  ? ?Consent to Services:  ?The patient was given information about Chronic Care Management services, agreed to services, and gave verbal consent prior to initiation of services.  Please see initial visit note for detailed documentation.  ? ?Patient agreed to services and verbal consent obtained.  ? ?Assessment: Review of patient past medical history, allergies, medications, health status, including review of consultants reports, laboratory and other test data, was performed as part of comprehensive evaluation and provision of chronic care management services.  ? ?SDOH (Social Determinants of Health) assessments and interventions performed:   ? ?CCM Care Plan ? ?Allergies  ?Allergen Reactions  ? Atorvastatin Other (See Comments)  ?  Significant rise in liver tests  ? Codeine Nausea And Vomiting  ? Diclofenac Other (See Comments)  ?  Elevated LFT's  ? Doxycycline   ?  Extreme nausea  ? Sulfonamide Derivatives Swelling  ? ? ?Outpatient Encounter Medications as of 02/27/2022  ?Medication Sig  ? albuterol (VENTOLIN HFA) 108 (90 Base) MCG/ACT inhaler Inhale 2 puffs into the lungs every 4 (four) hours as needed. For shortness of breath and wheezing  ? Aspirin-Caffeine (BAYER BACK & BODY) 500-32.5 MG TABS Take 1 tablet by mouth daily as needed. Reports takes 1-2 as needed  ? donepezil (ARICEPT) 5 MG tablet Take 1 tablet (5 mg total) by mouth at bedtime.  ? DULoxetine (CYMBALTA) 60 MG capsule Take 1 capsule (60 mg total) by mouth daily.  ?  haloperidol (HALDOL) 1 MG tablet Take 1 tablet (1 mg total) by mouth 2 (two) times daily at 8 am and 4 pm.  ? levothyroxine (SYNTHROID) 50 MCG tablet TAKE 1 TABLET BY MOUTH EVERY DAY BEFORE BREAKFAST  ? LORazepam (ATIVAN) 1 MG tablet Take 1 tablet (1 mg total) by mouth at bedtime.  ? metFORMIN (GLUCOPHAGE) 1000 MG tablet Take 1 tablet (1,000 mg total) by mouth 2 (two) times daily with a meal.  ? ondansetron (ZOFRAN) 4 MG tablet Take 1 tablet (4 mg total) by mouth every 8 (eight) hours as needed for nausea or vomiting.  ? propranolol (INDERAL) 20 MG tablet Take 1 tablet (20 mg total) by mouth daily.  ? QUEtiapine (SEROQUEL) 200 MG tablet Take 1 tablet (200 mg total) by mouth at bedtime.  ? blood glucose meter kit and supplies Dispense based on patient and insurance preference. Use once a day.  DX Code: E11.9 (Patient not taking: Reported on 02/13/2022)  ? glucose blood test strip Use as instructed once a day.  Dx Code: E11.9  ? hydrocortisone-pramoxine (ANALPRAM HC) 2.5-1 % rectal cream Place 1 application rectally 3 (three) times daily. (Patient not taking: Reported on 01/25/2022)  ? Lancets MISC Use as directed once a day.  Dx code: E11.9  ? lidocaine (LIDODERM) 5 % Place 1 patch onto the skin daily as needed. Remove & Discard patch within 12 hours or as directed by MD  ? ?No facility-administered encounter medications on file as of 02/27/2022.  ? ? ?Patient Active Problem List  ? Diagnosis Date Noted  ?  Altered mental status 12/03/2021  ? Anxiety 11/28/2021  ? Memory loss 10/26/2021  ? Bilateral hearing loss 10/26/2021  ? 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  ? Degenerative lumbar spinal stenosis   ? Anterolisthesis of lumbar spine   ? Lump in upper inner quadrant of left breast 07/13/2021  ? Left flank pain 07/13/2021  ? Lumbar radiculopathy 01/26/2021  ? Body mass index (BMI) 36.0-36.9, adult 01/11/2021  ? Degeneration of lumbar intervertebral disc 08/22/2020  ? Retrolisthesis of  vertebrae 08/22/2020  ? Foraminal stenosis of lumbar region 08/22/2020  ? Generalized anxiety disorder 09/01/2018  ? Type II diabetes mellitus 06/05/2018  ? Tinea corporis 09/01/2017  ? Left upper quadrant pain 09/01/2017  ? Estrogen deficiency 09/01/2017  ? Thoracic aortic atherosclerosis 05/14/2017  ? COPD (chronic obstructive pulmonary disease) 10/30/2016  ? Allergic rhinitis 10/03/2016  ? Palpitations 07/20/2016  ? DOE (dyspnea on exertion) 07/20/2016  ? External hemorrhoid 08/01/2015  ? Stress incontinence, female 08/01/2015  ? Dermatitis 06/02/2015  ? Shingles outbreak 04/14/2015  ? Hyperlipidemia associated with type 2 diabetes mellitus ([place])   ? Fibromyalgia 10/14/2014  ? Bilateral hip pain 09/02/2014  ? Deformity of finger 07/15/2014  ? Ceruminosis 03/04/2014  ? Tinnitus 03/04/2014  ? Torticollis 03/04/2014  ? Bipolar 2 disorder 12/03/2013  ? 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-sided sciatica 08/06/2012  ? Lower abdominal pain 08/06/2012  ? Otitis media 09/05/2010  ? Peripheral neuropathy 02/22/2010  ? Lumbar back pain with radiculopathy affecting left lower extremity 01/24/2010  ? Cervicalgia 06/20/2007  ? Chronic low back pain 06/20/2007  ? Essential (primary) hypertension 04/23/2007  ? Inflammatory disease of uterus 04/23/2007  ? ? ?Conditions to be addressed/monitored:HTN, DMII, and Chronic Pain ? ?Care Plan : RN Care Manager Plan of Care  ?Updates made by Luretha Rued, RN since 02/27/2022 12:00 AM  ?  ? ?Problem: No plan of Care Established for Managment of [place] Disease   ?Priority: High  ?  ? ?Long-Range Goal: Developement of Plan of Care for Chronic Disease Management and/or care coordination needs   ?Start Date: 11/07/2021  ?Expected End Date: 05/08/2022  ?Priority: High  ?Note:   ?Current Barriers:  ?Knowledge Deficits related to plan of care for management of HTN, DMII, and decreased mobility, chronic pain  ?Chronic Disease Management support and education needs related to HTN, DMII, and  chronic pain ?Chronic low back pain limiting mobility ?Does not routinely check blood sugar levels ?Does not have blood pressure monitor ?Mental health challenges(followed by Psychiatry) ?01/25/22 76 year old with history of HTN; DM; bilateral hearing loss-upcoming appointment with audiologist; memory loss, Bipolar/Generalized Anxiety Disorder-managed by psychiatry. Recent behavior health admission (12/03/21-12/22/21)-medications adjusted. RNCM called and spoke with daughter, Geraldo Pitter), who reports Mrs. Guevara is better. She reports Mrs. Landry continues to live at home with her husband and spends a lot of time in her bed. However she reports Mrs. Castell is active with [facility] home health physical therapy and is getting up to walk a little more. They are in the process of getting an in-home aide to assist Mrs. Ploeger with bathing (private pay). She also reports plans to return to water therapy once approved. Per Mrs. Delbert Phenix, Mrs. Cuadra is currently out getting a hair cut which has not happened since before her hospitalization. Blood sugars not checked. But overall, per Mrs. Delbert Phenix, Mrs. Silverstein "is doing good". ?02/27/22 RNCM spoke with Pincus Large (daughter/DPR). She reports Mrs. Archuleta "is doing good". Mrs. Quilter is 1) attending  water therapy twice a week 2) attending scheduled hair appointments, 3) has someone coming in to assist. Per Ms. Boyles, patient declines to check blood sugar or check blood pressure. She states that although patient continues to spend a lot of time sedentary, she has more of a routine and is getting out more and is more active, which is helping to build her strength. She denies any questions or concerns at this time. ? ?RNCM Clinical Goal(s):  ?Patient will verbalize understanding of plan for management of HTN and DMII as evidenced by self report, caregiver report, chart notiation ?take all medications exactly as prescribed and will call provider for medication related  questions as evidenced by sefl report, caregiver report, chart notation    ?demonstrate improved health management independence as evidenced by report of health management       through collaboration with RN Ca

## 2022-03-01 ENCOUNTER — Other Ambulatory Visit: Payer: Self-pay | Admitting: Family Medicine

## 2022-03-08 ENCOUNTER — Telehealth: Payer: Medicare Other

## 2022-03-11 DIAGNOSIS — I10 Essential (primary) hypertension: Secondary | ICD-10-CM | POA: Diagnosis not present

## 2022-03-11 DIAGNOSIS — E1151 Type 2 diabetes mellitus with diabetic peripheral angiopathy without gangrene: Secondary | ICD-10-CM | POA: Diagnosis not present

## 2022-03-11 DIAGNOSIS — F3181 Bipolar II disorder: Secondary | ICD-10-CM

## 2022-03-11 DIAGNOSIS — J449 Chronic obstructive pulmonary disease, unspecified: Secondary | ICD-10-CM | POA: Diagnosis not present

## 2022-03-12 DIAGNOSIS — M545 Low back pain, unspecified: Secondary | ICD-10-CM | POA: Diagnosis not present

## 2022-03-12 DIAGNOSIS — R2689 Other abnormalities of gait and mobility: Secondary | ICD-10-CM | POA: Diagnosis not present

## 2022-03-15 DIAGNOSIS — R2689 Other abnormalities of gait and mobility: Secondary | ICD-10-CM | POA: Diagnosis not present

## 2022-03-15 DIAGNOSIS — M545 Low back pain, unspecified: Secondary | ICD-10-CM | POA: Diagnosis not present

## 2022-03-16 ENCOUNTER — Telehealth: Payer: Self-pay | Admitting: Physician Assistant

## 2022-03-16 ENCOUNTER — Telehealth: Payer: Medicare Other | Admitting: Physician Assistant

## 2022-03-16 NOTE — Telephone Encounter (Signed)
Patient's daughter([name]) lvm at 2:45 needing refills for [name]. After returning call to [name] she informed that [name] needs refills on the medications Meloxicam 7.5, Lorazepam '1mg'$ , Prazosin '2mg'$ , Haloperidol '1mg'$ , Donepezil '5mg'$  and Seroquel '200mg'$ . Ph: [facility] ?

## 2022-03-18 ENCOUNTER — Other Ambulatory Visit: Payer: Self-pay | Admitting: Physician Assistant

## 2022-03-18 MED ORDER — QUETIAPINE FUMARATE 200 MG PO TABS: 200.0000 mg | ORAL_TABLET | Freq: Every day | ORAL | 2 refills | Status: DC

## 2022-03-18 MED ORDER — HALOPERIDOL 1 MG PO TABS: 1.0000 mg | ORAL_TABLET | ORAL | 2 refills | Status: DC

## 2022-03-18 NOTE — Telephone Encounter (Signed)
Has a refill of lorazepam, Prazosin was discontinued, Seroquel, Aricept, Haldol Rx sent. Meloxicam no prescribed by CR.  ?

## 2022-03-19 ENCOUNTER — Telehealth: Payer: Self-pay | Admitting: Physician Assistant

## 2022-03-19 ENCOUNTER — Other Ambulatory Visit: Payer: Self-pay | Admitting: Physician Assistant

## 2022-03-19 ENCOUNTER — Other Ambulatory Visit: Payer: Self-pay | Admitting: Family Medicine

## 2022-03-19 NOTE — Telephone Encounter (Signed)
I called the patient's daughter Almyra Free and discussed prazosin.  There was some confusion as to whether she was still on this drug or not.  She is still on it, 2 mg, not 3 mg as per note in the February I believe.  At any rate as send a new prescription in. ?

## 2022-03-20 ENCOUNTER — Telehealth: Payer: Self-pay | Admitting: Family Medicine

## 2022-03-20 NOTE — Telephone Encounter (Signed)
Pt's daughter states she was given meloxicam (mobic) 7.5 mg when she was in the hospital for back pain. She is out and hoping dr.Lowne can take over this rx. She stated it is very important as her mom is now stable and they do not want to mess her progress up.  ?

## 2022-03-21 ENCOUNTER — Other Ambulatory Visit: Payer: Self-pay | Admitting: Family Medicine

## 2022-03-21 DIAGNOSIS — G8929 Other chronic pain: Secondary | ICD-10-CM

## 2022-03-21 MED ORDER — MELOXICAM 7.5 MG PO TABS: 7.5000 mg | ORAL_TABLET | Freq: Every day | ORAL | 1 refills | Status: DC

## 2022-03-21 NOTE — Telephone Encounter (Signed)
Pt's daughter made aware of Rx ?

## 2022-03-22 DIAGNOSIS — M545 Low back pain, unspecified: Secondary | ICD-10-CM | POA: Diagnosis not present

## 2022-03-22 DIAGNOSIS — R2689 Other abnormalities of gait and mobility: Secondary | ICD-10-CM | POA: Diagnosis not present

## 2022-03-27 DIAGNOSIS — R2689 Other abnormalities of gait and mobility: Secondary | ICD-10-CM | POA: Diagnosis not present

## 2022-03-27 DIAGNOSIS — M545 Low back pain, unspecified: Secondary | ICD-10-CM | POA: Diagnosis not present

## 2022-03-29 DIAGNOSIS — R2689 Other abnormalities of gait and mobility: Secondary | ICD-10-CM | POA: Diagnosis not present

## 2022-03-29 DIAGNOSIS — M545 Low back pain, unspecified: Secondary | ICD-10-CM | POA: Diagnosis not present

## 2022-04-03 DIAGNOSIS — R2689 Other abnormalities of gait and mobility: Secondary | ICD-10-CM | POA: Diagnosis not present

## 2022-04-03 DIAGNOSIS — M545 Low back pain, unspecified: Secondary | ICD-10-CM | POA: Diagnosis not present

## 2022-04-10 ENCOUNTER — Other Ambulatory Visit: Payer: Self-pay | Admitting: Physician Assistant

## 2022-04-11 ENCOUNTER — Encounter: Payer: Self-pay | Admitting: Physician Assistant

## 2022-04-11 ENCOUNTER — Ambulatory Visit (INDEPENDENT_AMBULATORY_CARE_PROVIDER_SITE_OTHER): Payer: Medicare Other | Admitting: Physician Assistant

## 2022-04-11 DIAGNOSIS — F4310 Post-traumatic stress disorder, unspecified: Secondary | ICD-10-CM

## 2022-04-11 DIAGNOSIS — G2401 Drug induced subacute dyskinesia: Secondary | ICD-10-CM

## 2022-04-11 DIAGNOSIS — R413 Other amnesia: Secondary | ICD-10-CM

## 2022-04-11 DIAGNOSIS — F411 Generalized anxiety disorder: Secondary | ICD-10-CM | POA: Diagnosis not present

## 2022-04-11 DIAGNOSIS — F3181 Bipolar II disorder: Secondary | ICD-10-CM | POA: Diagnosis not present

## 2022-04-11 MED ORDER — LORAZEPAM 1 MG PO TABS: 0.5000 mg | ORAL_TABLET | Freq: Two times a day (BID) | ORAL | 2 refills | Status: DC | PRN

## 2022-04-11 MED ORDER — DULOXETINE HCL 60 MG PO CPEP
60.0000 mg | ORAL_CAPSULE | Freq: Every day | ORAL | 2 refills | Status: DC
Start: 2022-04-11 — End: 2022-06-18

## 2022-04-11 MED ORDER — DULOXETINE HCL 20 MG PO CPEP: 20.0000 mg | ORAL_CAPSULE | Freq: Every day | ORAL | 2 refills | Status: DC

## 2022-04-11 NOTE — Telephone Encounter (Signed)
Appt today

## 2022-04-11 NOTE — Progress Notes (Signed)
[facility]  Patient ID: LEONARD HENDLER,  MRN: [id number]  PCP: Ann Held, DO  Date of Evaluation: 04/11/2022 time spent:40 minutes  Chief Complaint:  Chief Complaint   Anxiety; Depression; Follow-up     HISTORY/CURRENT STATUS: HPI For routine follow-up.  Accompanied by her husband [name] and dtr [name].  [name] states patient has been more irritable and easy to get angry over the past few weeks. Anticipating her granddtr graduating, having to be pushed in a wheelchair at the [facility] to see her graduate has caused a lot of anxiety.  Other things going on in the family have been triggers as well.  [name] is upset saying she does not have control of anything, "they" pointing toward her husband and daughter are in control of everything and she does not like it.  She had an episode last week where she was extremely anxious, overwhelmed and upset.  She called her daughter asking if she had anything like Tranxene that she used to take when her nerves got bad.  She has only been on a nightly dose of Ativan since she got out of the hospital several months ago.  Her memory is about the same, possibly a little worse Almyra Free tells me in private.  Up until all this started a few weeks ago she had been doing much better.  She had been going to water therapy for the spinal stenosis, she had gone to play bridge with some friends but now all she does is stay in the bed and sleeps off and on.  When [name] and Almyra Free mentioned that [name] got very upset, raised her voice and said something to the effect of they would do the same thing if they were in her shoes.  Appetite is normal.  No suicidal or homicidal thoughts.  Personal hygiene is fair to good, mostly depending on how much pain she is in.  She has not seen her provider about that in a long time.  She has tried multiple treatments over the years and nothing much has helped.  The water therapy has been the most helpful.  Patient  denies increased energy with decreased need for sleep, no increased talkativeness, no racing thoughts, no impulsivity or risky behaviors, no increased spending, no increased libido, no grandiosity, no increased irritability or anger, no paranoia, and no hallucinations.  Review of Systems  Constitutional:  Positive for malaise/fatigue.  HENT:  Positive for hearing loss.   Eyes: Negative.   Respiratory: Negative.    Cardiovascular: Negative.   Gastrointestinal: Negative.   Genitourinary: Negative.   Musculoskeletal:  Positive for back pain and joint pain.       Chronic back pain  Skin: Negative.   Neurological:        Purses her lips, and sucks on the inside of her cheeks. It's not bothering her, or preventing her from eating/drinking normally. Pt says it doesn't bother her.   Endo/Heme/Allergies: Negative.   Psychiatric/Behavioral:         See HPI   Individual Medical History/ Review of Systems: Changes? :No    Past medications for mental health diagnoses include: Trileptal Lamictal Prozac Zoloft Effexor XR, Cymbalta, Paxil, lithium, Depakote, Latuda, Risperdal, Tegretol, Wellbutrin, propranolol for tremor, Thorazine  Haldol and Zyprexa given for a few days inpatient January 2023, propranolol for lithium tremor  Hospitalized in January 2023 at [facility] behavioral health for altered mental status with hallucinations, paranoia, and aggressive behavior  Allergies: Atorvastatin, Codeine, Diclofenac, Doxycycline, and Sulfonamide  derivatives  Current Medications:  Current Outpatient Medications:    Aspirin-Caffeine (BAYER BACK & BODY) 500-32.5 MG TABS, Take 1 tablet by mouth daily as needed. Reports takes 1-2 as needed, Disp: , Rfl:    blood glucose meter kit and supplies, Dispense based on patient and insurance preference. Use once a day.  DX Code: E11.9, Disp: 1 each, Rfl: 0   donepezil (ARICEPT) 5 MG tablet, TAKE 1 TABLET BY MOUTH EVERYDAY AT BEDTIME, Disp: 90 tablet, Rfl: 0    DULoxetine (CYMBALTA) 20 MG capsule, Take 1 capsule (20 mg total) by mouth daily. Take 1 po w/ 60 mg=80 mg day, Disp: 30 capsule, Rfl: 2   DULoxetine (CYMBALTA) 60 MG capsule, Take 1 capsule (60 mg total) by mouth daily., Disp: 30 capsule, Rfl: 2   glucose blood test strip, Use as instructed once a day.  Dx Code: E11.9, Disp: 30 each, Rfl: 2   haloperidol (HALDOL) 1 MG tablet, Take 1 tablet (1 mg total) by mouth 2 (two) times daily at 8 am and 4 pm., Disp: 60 tablet, Rfl: 2   Lancets MISC, Use as directed once a day.  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 Remove & Discard patch within 12 hours or as directed by MD, Disp: 30 patch, Rfl: 0   meloxicam (MOBIC) 7.5 MG tablet, Take 1 tablet (7.5 mg total) by mouth daily., Disp: 90 tablet, Rfl: 1   metFORMIN (GLUCOPHAGE) 1000 MG tablet, Take 1 tablet (1,000 mg total) by mouth 2 (two) times daily with a meal., Disp: 60 tablet, Rfl: 2   ondansetron (ZOFRAN) 4 MG tablet, Take 1 tablet (4 mg total) by mouth every 8 (eight) hours as needed for nausea or vomiting., Disp: 15 tablet, Rfl: 0   prazosin (MINIPRESS) 2 MG capsule, TAKE 1 CAPSULE BY MOUTH AT BEDTIME., Disp: 90 capsule, Rfl: 1   QUEtiapine (SEROQUEL) 200 MG tablet, Take 1 tablet (200 mg total) by mouth at bedtime., Disp: 30 tablet, Rfl: 2   albuterol (VENTOLIN HFA) 108 (90 Base) MCG/ACT inhaler, INHALE 2 PUFFS INTO THE LUNGS EVERY 4 (FOUR) HOURS AS NEEDED. FOR SHORTNESS OF BREATH AND WHEEZING (Patient not taking: Reported on 04/11/2022), Disp: 18 each, Rfl: 3   hydrocortisone-pramoxine (ANALPRAM HC) 2.5-1 % rectal cream, Place 1 application rectally 3 (three) times daily. (Patient not taking: Reported on 01/25/2022), Disp: 30 g, Rfl: 3   LORazepam (ATIVAN) 1 MG tablet, Take 0.5-1 tablets (0.5-1 mg total) by mouth 2  (two) times daily as needed for anxiety., Disp: 60 tablet, Rfl: 2 Medication Side Effects: none  Family Medical/ Social History: Changes? No  MENTAL HEALTH EXAM:  There were no vitals taken for this visit.There is no height or weight on file to calculate BMI.  General Appearance: Casual, Well Groomed, and Obese  Eye Contact:  Good  Speech:  Clear and Coherent and Normal Rate  Volume:  Normal  Mood:  Angry, Anxious, Depressed, Hopeless, and Irritable  Affect:  Depressed, Labile, Tearful, and Anxious  Thought Process:  Goal Directed and Descriptions of Associations: Circumstantial  Orientation:  Full (Time, Place, and Person)  Thought Content: Logical   Suicidal Thoughts:  No  Homicidal Thoughts:  No  Memory:  Immediate;   Poor Recent;   Poor Remote;   Good  Judgement:  Good  Insight:  Good  Psychomotor Activity:   She purses her lips off and on during the visit    Concentration:  Concentration: Good and Attention Span: Good  Recall:  Good  Fund of Knowledge: Good  Language: Good  Assets:  Desire for Improvement  ADL's:  Intact  Cognition: WNL  Prognosis:  Good    DIAGNOSES:    ICD-10-CM   1. Bipolar 2 disorder (HCC)  F31.81     2. Generalized anxiety disorder  F41.1     3. PTSD (post-traumatic stress disorder)  F43.10     4. Tardive dyskinesia  G24.01     5. Memory loss  R41.3      Receiving Psychotherapy: No    RECOMMENDATIONS:  PDMP reviewed.  Ativan filled 03/19/2022. I provided 40 minutes of face to face time during this encounter, including time spent before and after the visit in records review, medical decision making, counseling pertinent to today's visit, and charting.  Since she is no longer having the tremor that was likely related to the lithium, we will discontinue propranolol now. I discussed in private with Almyra Free that we may want to consider increasing the Aricept.  It may help stop the progression of memory loss.  But for now I do not want to change  too many things at once. We discussed the depression, I recommend increasing Cymbalta because that will help with the depression and pain.  She has been on as high as 120 mg in the past year.  A lot changed though with the severe illness and hospitalization that she had back in the winter. We briefly discussed the tardive dyskinesia.  This did not start until she was put on Haldol along with Seroquel.  The 4 of [name] agree that those medications are absolutely necessary for her wellbeing.  I did not discuss [place] and [place] with them today although I have with [name] and [name] in the past.  We can discuss at the next visit if appropriate.  Discontinue propranolol. Continue Aricept 5 mg, 1 p.o. nightly. Increase Cymbalta to a total of 80 mg daily. Continue Haldol 1 mg, 1 p.o. twice daily. Continue Ativan 1 mg, 1 p.o. nightly but also can take 1/2-1 po during day prn.  Continue prazosin 2 mg p.o. nightly Continue Seroquel 200 mg, 1 p.o. nightly. Return in 2 to 3 weeks.  Donnal Moat, PA-C

## 2022-04-12 DIAGNOSIS — M545 Low back pain, unspecified: Secondary | ICD-10-CM | POA: Diagnosis not present

## 2022-04-12 DIAGNOSIS — R2689 Other abnormalities of gait and mobility: Secondary | ICD-10-CM | POA: Diagnosis not present

## 2022-04-17 DIAGNOSIS — M545 Low back pain, unspecified: Secondary | ICD-10-CM | POA: Diagnosis not present

## 2022-04-17 DIAGNOSIS — R2689 Other abnormalities of gait and mobility: Secondary | ICD-10-CM | POA: Diagnosis not present

## 2022-04-23 ENCOUNTER — Ambulatory Visit (INDEPENDENT_AMBULATORY_CARE_PROVIDER_SITE_OTHER): Payer: Medicare Other | Admitting: Family Medicine

## 2022-04-23 ENCOUNTER — Encounter: Payer: Self-pay | Admitting: Family Medicine

## 2022-04-23 ENCOUNTER — Telehealth: Payer: Self-pay | Admitting: Physician Assistant

## 2022-04-23 VITALS — BP 120/90 | HR 98 | Temp 97.8°F | Resp 18 | Ht 62.0 in | Wt 180.8 lb

## 2022-04-23 DIAGNOSIS — I10 Essential (primary) hypertension: Secondary | ICD-10-CM | POA: Diagnosis not present

## 2022-04-23 DIAGNOSIS — E1169 Type 2 diabetes mellitus with other specified complication: Secondary | ICD-10-CM

## 2022-04-23 DIAGNOSIS — R21 Rash and other nonspecific skin eruption: Secondary | ICD-10-CM

## 2022-04-23 DIAGNOSIS — E785 Hyperlipidemia, unspecified: Secondary | ICD-10-CM | POA: Diagnosis not present

## 2022-04-23 DIAGNOSIS — E1165 Type 2 diabetes mellitus with hyperglycemia: Secondary | ICD-10-CM

## 2022-04-23 MED ORDER — NYSTATIN 100000 UNIT/GM EX POWD: 1.0000 "application " | Freq: Three times a day (TID) | CUTANEOUS | 0 refills | Status: DC

## 2022-04-23 NOTE — Progress Notes (Signed)
Established Patient Office Visit  Subjective   Patient ID: Megan Duncan, female    DOB: 12-02-1945  Age: 76 y.o. 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   Degenerative lumbar spinal stenosis    Anterolisthesis of lumbar spine    Lump in upper inner quadrant of left breast 07/13/2021   Left flank pain 07/13/2021   Lumbar radiculopathy 01/26/2021   Body mass index (BMI) 36.0-36.9, adult 01/11/2021   Degeneration of lumbar intervertebral disc 08/22/2020   Retrolisthesis of vertebrae 08/22/2020   Foraminal stenosis of lumbar region 08/22/2020   Generalized anxiety disorder 09/01/2018   Type II diabetes mellitus 06/05/2018   Tinea corporis 09/01/2017   Left upper quadrant pain 09/01/2017   Estrogen deficiency 09/01/2017   Thoracic aortic atherosclerosis 05/14/2017   COPD (chronic obstructive pulmonary disease) 10/30/2016   Allergic rhinitis 10/03/2016   Palpitations 07/20/2016   DOE (dyspnea on exertion) 07/20/2016   External hemorrhoid 08/01/2015   Stress incontinence, female 08/01/2015   Dermatitis 06/02/2015   Shingles outbreak 04/14/2015   Hyperlipidemia associated with type 2 diabetes mellitus ([place])    Fibromyalgia 10/14/2014   Bilateral hip pain 09/02/2014   Deformity of finger 07/15/2014   Ceruminosis 03/04/2014   Tinnitus 03/04/2014   Torticollis 03/04/2014   Bipolar 2 disorder 12/03/2013   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-sided sciatica 08/06/2012   Lower abdominal pain 08/06/2012   Otitis media 09/05/2010   Peripheral neuropathy 02/22/2010   Lumbar back pain with radiculopathy affecting left lower extremity 01/24/2010   Cervicalgia 06/20/2007   Chronic low back pain 06/20/2007    Essential (primary) hypertension 04/23/2007   Inflammatory disease of uterus 04/23/2007   Past Medical History:  Diagnosis Date   Acute pain of right shoulder 09/24/2018   Acute upper respiratory infection 09/19/2010   Allergic rhinitis 10/03/2016   Anterolisthesis of lumbar spine    Arthritis    hands   Bilateral hip pain 09/02/2014   Bipolar II disorder    Cellulitis and abscess of trunk 09/15/2009   Ceruminosis 03/04/2014   Cervicalgia 06/20/2007   Chronic low back pain 06/20/2007   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-sided sciatica 08/06/2012   COPD (chronic obstructive pulmonary disease) ([place])    Deformity of finger 07/15/2014   Degeneration of lumbar intervertebral disc 08/22/2020   Degenerative lumbar spinal stenosis    Dermatitis 78/29/5621   Diastolic dysfunction    Per pt, diagnosed after [facility]   Diverticulosis    DOE (dyspnea on exertion) 07/20/2016   Essential (primary) hypertension 04/23/2007   Estrogen deficiency 09/01/2017   External hemorrhoid 08/01/2015   Fibromyalgia    Foraminal stenosis of lumbar region 08/22/2020   Generalized anxiety disorder 09/01/2018   Hyperlipidemia    Inflammatory disease of uterus 04/23/2007   Left flank pain 07/13/2021   Left upper quadrant pain 09/01/2017   Lower abdominal pain 08/06/2012   Lumbar back pain with radiculopathy affecting left lower extremity 01/24/2010   Lumbar radiculopathy 01/26/2021   Lump in upper inner quadrant of left breast 07/13/2021   Major depressive disorder    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   Otitis media 09/05/2010   Palpitations 07/20/2016  Peripheral neuropathy 02/22/2010   Retrolisthesis of vertebrae 08/22/2020   Shingles outbreak 04/14/2015   Sinusitis, acute maxillary 09/14/2013   Stress incontinence, female 08/01/2015   Thoracic aortic atherosclerosis 05/14/2017   Tinea corporis 09/01/2017   Tinnitus 03/04/2014   Torticollis 03/04/2014    Type II diabetes mellitus 06/05/2018   Past Surgical History:  Procedure Laterality Date   APPENDECTOMY     EYE SURGERY     KNEE ARTHROSCOPY Right 11/12/2006   TONSILLECTOMY     TOTAL ABDOMINAL HYSTERECTOMY     Social History   Tobacco Use   Smoking status: Former    Packs/day: 1.50    Years: 40.00    Total pack years: 60.00    Types: Cigarettes    Quit date: 07/02/2004    Years since quitting: 17.8    Passive exposure: Past   Smokeless tobacco: Never   Tobacco comments:    Verified by Aldona Lento (Husband), & Lauralee Evener (Daughter)  Vaping Use   Vaping Use: Never used  Substance Use Topics   Alcohol use: Yes    Alcohol/week: 0.0 - 1.0 standard drinks of alcohol    Comment: occasional wine   Drug use: No   Social History   Socioeconomic History   Marital status: Married    Spouse name: Donnetta Gillin   Number of children: 3   Years of education: 14   Highest education level: Some college, no degree  Occupational History   Occupation: retired  Tobacco Use   Smoking status: Former    Packs/day: 1.50    Years: 40.00    Total pack years: 60.00    Types: Cigarettes    Quit date: 07/02/2004    Years since quitting: 17.8    Passive exposure: Past   Smokeless tobacco: Never   Tobacco comments:    Verified by Aldona Lento (Husband), & Lauralee Evener (Daughter)  Vaping Use   Vaping Use: Never used  Substance and Sexual Activity   Alcohol use: Yes    Alcohol/week: 0.0 - 1.0 standard drinks of alcohol    Comment: occasional wine   Drug use: No   Sexual activity: Never  Other Topics Concern   Not on file  Social History Narrative   Lives with husband and grandson she is raising.   11/07/21 Lives with husband   Social Determinants of Health   Financial Resource Strain: Medium Risk (11/29/2021)   Overall Financial Resource Strain (CARDIA)    Difficulty of Paying Living Expenses: Somewhat hard  Food Insecurity: No Food Insecurity (11/29/2021)   Hunger Vital  Sign    Worried About Running Out of Food in the Last Year: Never true    [place] in the Last Year: Never true  Transportation Needs: No Transportation Needs (11/29/2021)   PRAPARE - Hydrologist (Medical): No    Lack of Transportation (Non-Medical): No  Physical Activity: Inactive (11/29/2021)   Exercise Vital Sign    Days of Exercise per Week: 0 days    Minutes of Exercise per Session: 0 min  Stress: No Stress Concern Present (11/29/2021)   [place]    Feeling of Stress : Not at all  Social Connections: [place] (11/29/2021)   Social Connection and Isolation Panel [NHANES]    Frequency of Communication with Friends and Family: More than three times a week    Frequency of Social Gatherings with Friends and Family: More than  three times a week    Attends Religious Services: 1 to 4 times per year    Active Member of Clubs or Organizations: Yes    Attends Archivist Meetings: More than 4 times per year    Marital Status: Married  Human resources officer Violence: Not At Risk (11/29/2021)   Humiliation, Afraid, Rape, and Kick questionnaire    Fear of Current or Ex-Partner: No    Emotionally Abused: No    Physically Abused: No    Sexually Abused: No   Family Status  Relation Name Status   Mother  Deceased   Father  Deceased   Sister  Deceased   Mat Aunt  Deceased   Mat Aunt  (Not Specified)   Field seismologist  (Not Specified)   Family History  Problem Relation Age of Onset   Hypertension Mother    Hiatal hernia Mother    Diabetes Father    Hypertension Father    Heart attack Father    Alcoholism Father    Bipolar disorder Father    Mental illness Sister    Suicidality Sister    Colon cancer Maternal Aunt 80   Breast cancer Maternal Aunt    Bipolar disorder Paternal Aunt    Allergies  Allergen Reactions   Atorvastatin Other (See Comments)    Significant rise in  liver tests   Codeine Nausea And Vomiting   Diclofenac Other (See Comments)    Elevated LFT's   Doxycycline     Extreme nausea   Sulfonamide Derivatives Swelling      ROS    Objective:     BP 120/90 (BP Location: Left Arm, Patient Position: Sitting, Cuff Size: Large)   Pulse 98   Temp 97.8 F (36.6 C) (Oral)   Resp 18   Ht '5\' 2"'$  (1.575 m)   Wt 180 lb 12.8 oz (82 kg)   SpO2 94%   BMI 33.07 kg/m  BP Readings from Last 3 Encounters:  04/23/22 120/90  01/22/22 (!) 142/80  12/22/21 (!) 109/55   Wt Readings from Last 3 Encounters:  04/23/22 180 lb 12.8 oz (82 kg)  01/22/22 183 lb 4.8 oz (83.1 kg)  12/03/21 190 lb (86.2 kg)   SpO2 Readings from Last 3 Encounters:  04/23/22 94%  01/22/22 95%  12/22/21 97%      Physical Exam   Results for orders placed or performed in visit on 04/23/22  Comprehensive metabolic panel  Result Value Ref Range   Sodium 142 135 - 145 mEq/L   Potassium 4.4 3.5 - 5.1 mEq/L   Chloride 106 96 - 112 mEq/L   CO2 27 19 - 32 mEq/L   Glucose, Bld 86 70 - 99 mg/dL   BUN 12 6 - 23 mg/dL   Creatinine, Ser 1.03 0.40 - 1.20 mg/dL   Total Bilirubin 0.4 0.2 - 1.2 mg/dL   Alkaline Phosphatase 56 39 - 117 U/L   AST 19 0 - 37 U/L   ALT 15 0 - 35 U/L   Total Protein 6.5 6.0 - 8.3 g/dL   Albumin 4.0 3.5 - 5.2 g/dL   GFR 53.02 (L) >60.00 mL/min   Calcium 10.0 8.4 - 10.5 mg/dL  Lipid panel  Result Value Ref Range   Cholesterol 196 0 - 200 mg/dL   Triglycerides 143.0 0.0 - 149.0 mg/dL   HDL 71.10 >39.00 mg/dL   VLDL 28.6 0.0 - 40.0 mg/dL   LDL Cholesterol 96 0 - 99 mg/dL   Total CHOL/HDL Ratio  3    NonHDL 124.56   Hemoglobin A1c  Result Value Ref Range   Hgb A1c MFr Bld 5.8 4.6 - 6.5 %  TSH  Result Value Ref Range   TSH 0.93 0.35 - 5.50 uIU/mL    Last CBC Lab Results  Component Value Date   WBC 9.6 12/02/2021   HGB 13.9 12/02/2021   HCT 44.0 12/02/2021   MCV 98.4 12/02/2021   MCH 31.1 12/02/2021   RDW 13.6 12/02/2021   PLT 332  44/81/8563   Last metabolic panel Lab Results  Component Value Date   GLUCOSE 86 04/23/2022   NA 142 04/23/2022   K 4.4 04/23/2022   CL 106 04/23/2022   CO2 27 04/23/2022   BUN 12 04/23/2022   CREATININE 1.03 04/23/2022   GFRNONAA >60 12/02/2021   CALCIUM 10.0 04/23/2022   PROT 6.5 04/23/2022   ALBUMIN 4.0 04/23/2022   BILITOT 0.4 04/23/2022   ALKPHOS 56 04/23/2022   AST 19 04/23/2022   ALT 15 04/23/2022   ANIONGAP 11 12/02/2021   Last lipids Lab Results  Component Value Date   CHOL 196 04/23/2022   HDL 71.10 04/23/2022   LDLCALC 96 04/23/2022   LDLDIRECT 120.1 12/03/2013   TRIG 143.0 04/23/2022   CHOLHDL 3 04/23/2022   Last hemoglobin A1c Lab Results  Component Value Date   HGBA1C 5.8 04/23/2022   Last thyroid functions Lab Results  Component Value Date   TSH 0.93 04/23/2022   T4TOTAL 6.7 06/02/2019   Last vitamin D No results found for: "25OHVITD2", "25OHVITD3", "VD25OH" Last vitamin B12 and Folate Lab Results  Component Value Date   VITAMINB12 1,132 (H) 10/23/2021      The 10-year ASCVD risk score (Arnett DK, et al., 2019) is: 33.2%    Assessment & Plan:   Problem List Items Addressed This Visit       Unprioritized   Type II diabetes mellitus   Relevant Orders   Comprehensive metabolic panel (Completed)   Lipid panel (Completed)   Hemoglobin A1c (Completed)   TSH (Completed)   Hyperlipidemia associated with type 2 diabetes mellitus ([place])   Relevant Orders   Comprehensive metabolic panel (Completed)   Lipid panel (Completed)   Hemoglobin A1c (Completed)   TSH (Completed)   Other Visit Diagnoses     Rash    -  Primary   Relevant Medications   nystatin powder   Primary hypertension       Relevant Orders   Comprehensive metabolic panel (Completed)   Lipid panel (Completed)   Hemoglobin A1c (Completed)   TSH (Completed)       Return in about 6 months (around 10/23/2022), or if symptoms worsen or fail to improve, for hypertension,  hyperlipidemia, diabetes II.    Ann Held, DO

## 2022-04-23 NOTE — Progress Notes (Signed)
Subjective:   By signing my name below, I, [name], attest that this documentation has been prepared under the direction and in the presence of Ann Held DO 04/23/2022   Patient ID: Megan Duncan, female    DOB: [birth year], 76 y.o.   MRN: [id number]  Chief Complaint  Patient presents with   Rash    HPI Patient is in today for an office visit. She is accompanied by her partner.   She complains of a rash that is on her groin ever. She can feel that rash's heat. She believes that she got the rash from being in chlorinated water. She denies of drying off before going in the car. She states that she has been using neosporin in the area.   She reports that her chronic back pain is still persistent. She is currently taking 7.5 Mg of Meloxicam. She is also taking an OTC pain medication that contains Advil, she reports that the medication is relieving her symptoms.   She reports that her lips have begun to quiver and she is drooling after taking the medication that her behaviorist has prescribed her. Her partner reports that taking 1 Mg of Haldol and 1 Mg of Lorazepram.   As of today's visit, her blood pressure is normal BP Readings from Last 3 Encounters:  04/23/22 120/90  01/22/22 (!) 142/80  12/22/21 (!) 109/55   Pulse Readings from Last 3 Encounters:  04/23/22 98  01/22/22 68  12/22/21 81   She states that her weight is increasing. She also states that she is consuming increased amounts of cereal. She is walking some while at home. She has a walker and cane at home but does not use them.  Wt Readings from Last 3 Encounters:  04/23/22 180 lb 12.8 oz (82 kg)  01/22/22 183 lb 4.8 oz (83.1 kg)  12/03/21 190 lb (86.2 kg)   She is not regularly checking her blood sugars at home. Lab Results  Component Value Date   HGBA1C 5.8 04/23/2022   Her partner is inquiring about what the patient can take for her runny nose. He reports that symptoms are constant.   Past  Medical History:  Diagnosis Date   Acute pain of right shoulder 09/24/2018   Acute upper respiratory infection 09/19/2010   Allergic rhinitis 10/03/2016   Anterolisthesis of lumbar spine    Arthritis    hands   Bilateral hip pain 09/02/2014   Bipolar II disorder    Cellulitis and abscess of trunk 09/15/2009   Ceruminosis 03/04/2014   Cervicalgia 06/20/2007   Chronic low back pain 06/20/2007   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-sided sciatica 08/06/2012   COPD (chronic obstructive pulmonary disease) (HCC)    Deformity of finger 07/15/2014   Degeneration of lumbar intervertebral disc 08/22/2020   Degenerative lumbar spinal stenosis    Dermatitis 75/08/2584   Diastolic dysfunction    Per pt, diagnosed after [facility]   Diverticulosis    DOE (dyspnea on exertion) 07/20/2016   Essential (primary) hypertension 04/23/2007   Estrogen deficiency 09/01/2017   External hemorrhoid 08/01/2015   Fibromyalgia    Foraminal stenosis of lumbar region 08/22/2020   Generalized anxiety disorder 09/01/2018   Hyperlipidemia    Inflammatory disease of uterus 04/23/2007   Left flank pain 07/13/2021   Left upper quadrant pain 09/01/2017   Lower abdominal pain 08/06/2012   Lumbar back pain with radiculopathy affecting left lower extremity 01/24/2010   Lumbar radiculopathy 01/26/2021   Lump  in upper inner quadrant of left breast 07/13/2021   Major depressive disorder    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   Otitis media 09/05/2010   Palpitations 07/20/2016   Peripheral neuropathy 02/22/2010   Retrolisthesis of vertebrae 08/22/2020   Shingles outbreak 04/14/2015   Sinusitis, acute maxillary 09/14/2013   Stress incontinence, female 08/01/2015   Thoracic aortic atherosclerosis 05/14/2017   Tinea corporis 09/01/2017   Tinnitus 03/04/2014   Torticollis 03/04/2014   Type II diabetes mellitus 06/05/2018    Past Surgical History:  Procedure Laterality Date    APPENDECTOMY     EYE SURGERY     KNEE ARTHROSCOPY Right 11/12/2006   TONSILLECTOMY     TOTAL ABDOMINAL HYSTERECTOMY      Family History  Problem Relation Age of Onset   Hypertension Mother    Hiatal hernia Mother    Diabetes Father    Hypertension Father    Heart attack Father    Alcoholism Father    Bipolar disorder Father    Mental illness Sister    Suicidality Sister    Colon cancer Maternal Aunt 73   Breast cancer Maternal Aunt    Bipolar disorder Paternal Aunt     Social History   Socioeconomic History   Marital status: Married    Spouse name: Nance Mccombs   Number of children: 3   Years of education: 14   Highest education level: Some college, no degree  Occupational History   Occupation: retired  Tobacco Use   Smoking status: Former    Packs/day: 1.50    Years: 40.00    Total pack years: 60.00    Types: Cigarettes    Quit date: 07/02/2004    Years since quitting: 17.8    Passive exposure: Past   Smokeless tobacco: Never   Tobacco comments:    Verified by Aldona Lento (Husband), & Lauralee Evener (Daughter)  Vaping Use   Vaping Use: Never used  Substance and Sexual Activity   Alcohol use: Yes    Alcohol/week: 0.0 - 1.0 standard drinks of alcohol    Comment: occasional wine   Drug use: No   Sexual activity: Never  Other Topics Concern   Not on file  Social History Narrative   Lives with husband and grandson she is raising.   11/07/21 Lives with husband   Social Determinants of Health   Financial Resource Strain: Medium Risk (11/29/2021)   Overall Financial Resource Strain (CARDIA)    Difficulty of Paying Living Expenses: Somewhat hard  Food Insecurity: No Food Insecurity (11/29/2021)   Hunger Vital Sign    Worried About Running Out of Food in the Last Year: Never true    [place] in the Last Year: Never true  Transportation Needs: No Transportation Needs (11/29/2021)   PRAPARE - Hydrologist (Medical): No     Lack of Transportation (Non-Medical): No  Physical Activity: Inactive (11/29/2021)   Exercise Vital Sign    Days of Exercise per Week: 0 days    Minutes of Exercise per Session: 0 min  Stress: No Stress Concern Present (11/29/2021)   [place]    Feeling of Stress : Not at all  Social Connections: [place] (11/29/2021)   Social Connection and Isolation Panel [NHANES]    Frequency of Communication with Friends and Family: More than three times a week    Frequency of Social Gatherings with Friends and  Family: More than three times a week    Attends Religious Services: 1 to 4 times per year    Active Member of Clubs or Organizations: Yes    Attends Archivist Meetings: More than 4 times per year    Marital Status: Married  Human resources officer Violence: Not At Risk (11/29/2021)   Humiliation, Afraid, Rape, and Kick questionnaire    Fear of Current or Ex-Partner: No    Emotionally Abused: No    Physically Abused: No    Sexually Abused: No    Outpatient Medications Prior to Visit  Medication Sig Dispense Refill   Aspirin-Caffeine (BAYER BACK & BODY) 500-32.5 MG TABS Take 1 tablet by mouth daily as needed. Reports takes 1-2 as needed     blood glucose meter kit and supplies Dispense based on patient and insurance preference. Use once a day.  DX Code: E11.9 1 each 0   donepezil (ARICEPT) 5 MG tablet TAKE 1 TABLET BY MOUTH EVERYDAY AT BEDTIME 90 tablet 0   DULoxetine (CYMBALTA) 20 MG capsule Take 1 capsule (20 mg total) by mouth daily. Take 1 po w/ 60 mg=80 mg day 30 capsule 2   DULoxetine (CYMBALTA) 60 MG capsule Take 1 capsule (60 mg total) by mouth daily. 30 capsule 2   glucose blood test strip Use as instructed once a day.  Dx Code: E11.9 30 each 2   haloperidol (HALDOL) 1 MG tablet Take 1 tablet (1 mg total) by mouth 2 (two) times daily at 8 am and 4 pm. 60 tablet 2   Lancets MISC Use as directed once a day.   Dx code: E11.9 30 each 2   levothyroxine (SYNTHROID) 50 MCG tablet TAKE 1 TABLET BY MOUTH EVERY DAY BEFORE BREAKFAST 90 tablet 1   lidocaine (LIDODERM) 5 % Place 1 patch onto the skin daily as needed. Remove & Discard patch within 12 hours or as directed by MD 30 patch 0   LORazepam (ATIVAN) 1 MG tablet Take 0.5-1 tablets (0.5-1 mg total) by mouth 2 (two) times daily as needed for anxiety. 60 tablet 2   metFORMIN (GLUCOPHAGE) 1000 MG tablet Take 1 tablet (1,000 mg total) by mouth 2 (two) times daily with a meal. 60 tablet 2   ondansetron (ZOFRAN) 4 MG tablet Take 1 tablet (4 mg total) by mouth every 8 (eight) hours as needed for nausea or vomiting. 15 tablet 0   prazosin (MINIPRESS) 2 MG capsule TAKE 1 CAPSULE BY MOUTH AT BEDTIME. 90 capsule 1   QUEtiapine (SEROQUEL) 200 MG tablet TAKE 1 TABLET BY MOUTH AT BEDTIME. 90 tablet 1   meloxicam (MOBIC) 7.5 MG tablet Take 1 tablet (7.5 mg total) by mouth daily. 90 tablet 1   albuterol (VENTOLIN HFA) 108 (90 Base) MCG/ACT inhaler INHALE 2 PUFFS INTO THE LUNGS EVERY 4 (FOUR) HOURS AS NEEDED. FOR SHORTNESS OF BREATH AND WHEEZING (Patient not taking: Reported on 04/11/2022) 18 each 3   hydrocortisone-pramoxine (ANALPRAM HC) 2.5-1 % rectal cream Place 1 application rectally 3 (three) times daily. (Patient not taking: Reported on 01/25/2022) 30 g 3   No facility-administered medications prior to visit.    Allergies  Allergen Reactions   Atorvastatin Other (See Comments)    Significant rise in liver tests   Codeine Nausea And Vomiting   Diclofenac Other (See Comments)    Elevated LFT's   Doxycycline     Extreme nausea   Sulfonamide Derivatives Swelling    Review of Systems  Constitutional:  Negative for  fever and malaise/fatigue.  HENT:  Negative for congestion.        (+) rhinorrhea  Eyes:  Negative for blurred vision.  Respiratory:  Negative for cough and shortness of breath.   Cardiovascular:  Negative for chest pain, palpitations and leg  swelling.  Gastrointestinal:  Negative for vomiting.  Musculoskeletal:  Negative for back pain.  Skin:  Positive for rash (Groin).  Neurological:  Negative for loss of consciousness and headaches.       Objective:    Physical Exam Vitals and nursing note reviewed.  Constitutional:      General: She is not in acute distress.    Appearance: Normal appearance. She is not ill-appearing.  HENT:     Head: Normocephalic and atraumatic.     Right Ear: External ear normal.     Left Ear: External ear normal.  Eyes:     Extraocular Movements: Extraocular movements intact.     Pupils: Pupils are equal, round, and reactive to light.  Cardiovascular:     Rate and Rhythm: Normal rate and regular rhythm.     Heart sounds: Normal heart sounds. No murmur heard.    No gallop.  Pulmonary:     Effort: Pulmonary effort is normal. No respiratory distress.     Breath sounds: Normal breath sounds. No wheezing or rales.  Skin:    General: Skin is warm and dry.  Neurological:     Mental Status: She is alert and oriented to person, place, and time.  Psychiatric:        Judgment: Judgment normal.     BP 120/90 (BP Location: Left Arm, Patient Position: Sitting, Cuff Size: Large)   Pulse 98   Temp 97.8 F (36.6 C) (Oral)   Resp 18   Ht _0  (1.575 m)   Wt 180 lb 12.8 oz (82 kg)   SpO2 94%   BMI 33.07 kg/m  Wt Readings from Last 3 Encounters:  04/23/22 180 lb 12.8 oz (82 kg)  01/22/22 183 lb 4.8 oz (83.1 kg)  12/03/21 190 lb (86.2 kg)    Diabetic Foot Exam - Simple   No data filed    Lab Results  Component Value Date   WBC 9.6 12/02/2021   HGB 13.9 12/02/2021   HCT 44.0 12/02/2021   PLT 332 12/02/2021   GLUCOSE 86 04/23/2022   CHOL 196 04/23/2022   TRIG 143.0 04/23/2022   HDL 71.10 04/23/2022   LDLDIRECT 120.1 12/03/2013   LDLCALC 96 04/23/2022   ALT 15 04/23/2022   AST 19 04/23/2022   NA 142 04/23/2022   K 4.4 04/23/2022   CL 106 04/23/2022   CREATININE 1.03 04/23/2022    BUN 12 04/23/2022   CO2 27 04/23/2022   TSH 0.93 04/23/2022   INR 1.0 10/25/2015   HGBA1C 5.8 04/23/2022   MICROALBUR 2.7 (H) 09/22/2020    Lab Results  Component Value Date   TSH 0.93 04/23/2022   Lab Results  Component Value Date   WBC 9.6 12/02/2021   HGB 13.9 12/02/2021   HCT 44.0 12/02/2021   MCV 98.4 12/02/2021   PLT 332 12/02/2021   Lab Results  Component Value Date   NA 142 04/23/2022   K 4.4 04/23/2022   CO2 27 04/23/2022   GLUCOSE 86 04/23/2022   BUN 12 04/23/2022   CREATININE 1.03 04/23/2022   BILITOT 0.4 04/23/2022   ALKPHOS 56 04/23/2022   AST 19 04/23/2022   ALT 15 04/23/2022   PROT 6.5 04/23/2022  ALBUMIN 4.0 04/23/2022   CALCIUM 10.0 04/23/2022   ANIONGAP 11 12/02/2021   GFR 53.02 (L) 04/23/2022   Lab Results  Component Value Date   CHOL 196 04/23/2022   Lab Results  Component Value Date   HDL 71.10 04/23/2022   Lab Results  Component Value Date   LDLCALC 96 04/23/2022   Lab Results  Component Value Date   TRIG 143.0 04/23/2022   Lab Results  Component Value Date   CHOLHDL 3 04/23/2022   Lab Results  Component Value Date   HGBA1C 5.8 04/23/2022       Assessment & Plan:   Problem List Items Addressed This Visit       Unprioritized   Type II diabetes mellitus   Relevant Orders   Comprehensive metabolic panel (Completed)   Lipid panel (Completed)   Hemoglobin A1c (Completed)   TSH (Completed)   Rash - Primary    Tinea--- pt did not want to get undressed  Rash is under breasts and in groin  Nystatin powder  F/u prn      Relevant Medications   nystatin powder   Hyperlipidemia associated with type 2 diabetes mellitus ([place])    Encourage heart healthy diet such as MIND or DASH diet, increase exercise, avoid trans fats, simple carbohydrates and processed foods, consider a krill or fish or flaxseed oil cap daily.       Relevant Orders   Comprehensive metabolic panel (Completed)   Lipid panel (Completed)   Hemoglobin  A1c (Completed)   TSH (Completed)   Essential (primary) hypertension    Well controlled, no changes to meds. Encouraged heart healthy diet such as the DASH diet and exercise as tolerated.       Other Visit Diagnoses     Primary hypertension       Relevant Orders   Comprehensive metabolic panel (Completed)   Lipid panel (Completed)   Hemoglobin A1c (Completed)   TSH (Completed)        Meds ordered this encounter  Medications   nystatin powder    Sig: Apply 1 application  topically 3 (three) times daily.    Dispense:  15 g    Refill:  0    I, Ann Held, DO, personally preformed the services described in this documentation.  All medical record entries made by the scribe were at my direction and in my presence.  I have reviewed the chart and discharge instructions (if applicable) and agree that the record reflects my personal performance and is accurate and complete. 04/23/2022   I,Amber Collins,acting as a scribe for [employer], DO.,have documented all relevant documentation on the behalf of Ann Held, DO,as directed by  Ann Held, DO while in the presence of Ann Held, DO.   Ann Held, DO

## 2022-04-23 NOTE — Telephone Encounter (Addendum)
[name] daughter called asking Helene Kelp to return call about Pt's med.PCP is wanting to D/C. Contact # for [name] [phone] asap. Med is [facility]

## 2022-04-23 NOTE — Telephone Encounter (Signed)
I spoke with Almyra Free, she was not at the appointment today with Dr. Etter Sjogren, so not exactly sure what was said but she is wondering if [name] may have mentioned not knowing if the meloxicam is not working or not.  Again she is not exactly sure but she wanted to check with me to see what I recommended as to whether she should stay on the meloxicam or go off of it and if she does go off of it will not affect her mental health medications. Unfortunately this is 1 of those situations where we want know how much it has helped until she is not on it.  Patient has been more active over this past week or so, her grandson's graduation in a party, and now she is doing water therapy again.  I suggest having her take it only on the days when she has somewhere to go or therapy of some sort.  Hopefully that will prevent pain.  And if on the other days she is having more pain without the meloxicam then I would suggest taking it every day, as long as it is okay with Dr. Etter Sjogren. Almyra Free understands.

## 2022-04-23 NOTE — Patient Instructions (Signed)

## 2022-04-24 DIAGNOSIS — R2689 Other abnormalities of gait and mobility: Secondary | ICD-10-CM | POA: Diagnosis not present

## 2022-04-24 DIAGNOSIS — M545 Low back pain, unspecified: Secondary | ICD-10-CM | POA: Diagnosis not present

## 2022-04-24 LAB — COMPREHENSIVE METABOLIC PANEL
ALT: 15 U/L (ref 0–35)
AST: 19 U/L (ref 0–37)
Albumin: 4 g/dL (ref 3.5–5.2)
Alkaline Phosphatase: 56 U/L (ref 39–117)
BUN: 12 mg/dL (ref 6–23)
CO2: 27 mEq/L (ref 19–32)
Calcium: 10 mg/dL (ref 8.4–10.5)
Chloride: 106 mEq/L (ref 96–112)
Creatinine, Ser: 1.03 mg/dL (ref 0.40–1.20)
GFR: 53.02 mL/min — ABNORMAL LOW (ref >60.00)
Glucose, Bld: 86 mg/dL (ref 70–99)
Potassium: 4.4 mEq/L (ref 3.5–5.1)
Sodium: 142 mEq/L (ref 135–145)
Total Bilirubin: 0.4 mg/dL (ref 0.2–1.2)
Total Protein: 6.5 g/dL (ref 6.0–8.3)

## 2022-04-24 LAB — TSH: TSH: 0.93 u[IU]/mL (ref 0.35–5.50)

## 2022-04-24 LAB — LIPID PANEL
Cholesterol: 196 mg/dL (ref 0–200)
HDL: 71.1 mg/dL (ref >39.00)
LDL Cholesterol: 96 mg/dL (ref 0–99)
NonHDL: 124.56
Total CHOL/HDL Ratio: 3
Triglycerides: 143 mg/dL (ref 0.0–149.0)
VLDL: 28.6 mg/dL (ref 0.0–40.0)

## 2022-04-24 LAB — HEMOGLOBIN A1C: Hgb A1c MFr Bld: 5.8 % (ref 4.6–6.5)

## 2022-04-25 ENCOUNTER — Other Ambulatory Visit: Payer: Self-pay

## 2022-04-25 DIAGNOSIS — G8929 Other chronic pain: Secondary | ICD-10-CM

## 2022-04-27 DIAGNOSIS — H02135 Senile ectropion of left lower eyelid: Secondary | ICD-10-CM | POA: Diagnosis not present

## 2022-04-29 DIAGNOSIS — R21 Rash and other nonspecific skin eruption: Secondary | ICD-10-CM | POA: Insufficient documentation

## 2022-04-29 NOTE — Assessment & Plan Note (Signed)
Tinea--- pt did not want to get undressed  Rash is under breasts and in groin  Nystatin powder  F/u prn

## 2022-04-29 NOTE — Assessment & Plan Note (Signed)
Well controlled, no changes to meds. Encouraged heart healthy diet such as the DASH diet and exercise as tolerated.  °

## 2022-04-29 NOTE — Assessment & Plan Note (Signed)
Encourage heart healthy diet such as MIND or DASH diet, increase exercise, avoid trans fats, simple carbohydrates and processed foods, consider a krill or fish or flaxseed oil cap daily.  °

## 2022-05-01 DIAGNOSIS — M545 Low back pain, unspecified: Secondary | ICD-10-CM | POA: Diagnosis not present

## 2022-05-01 DIAGNOSIS — R2689 Other abnormalities of gait and mobility: Secondary | ICD-10-CM | POA: Diagnosis not present

## 2022-05-03 DIAGNOSIS — R2689 Other abnormalities of gait and mobility: Secondary | ICD-10-CM | POA: Diagnosis not present

## 2022-05-03 DIAGNOSIS — M545 Low back pain, unspecified: Secondary | ICD-10-CM | POA: Diagnosis not present

## 2022-05-04 ENCOUNTER — Other Ambulatory Visit: Payer: Self-pay | Admitting: Physician Assistant

## 2022-05-08 DIAGNOSIS — M545 Low back pain, unspecified: Secondary | ICD-10-CM | POA: Diagnosis not present

## 2022-05-08 DIAGNOSIS — R2689 Other abnormalities of gait and mobility: Secondary | ICD-10-CM | POA: Diagnosis not present

## 2022-05-09 ENCOUNTER — Telehealth: Payer: Medicare Other | Admitting: Physician Assistant

## 2022-05-10 ENCOUNTER — Ambulatory Visit (INDEPENDENT_AMBULATORY_CARE_PROVIDER_SITE_OTHER): Payer: Medicare Other

## 2022-05-10 DIAGNOSIS — I10 Essential (primary) hypertension: Secondary | ICD-10-CM

## 2022-05-10 DIAGNOSIS — E1169 Type 2 diabetes mellitus with other specified complication: Secondary | ICD-10-CM

## 2022-05-10 NOTE — Chronic Care Management (AMB) (Signed)
Chronic Care Management   CCM RN Visit Note  05/10/2022 Name: Megan Duncan MRN: [id number] DOB: 07/01/1946  Subjective: Megan Duncan is a 75 y.o. year old female who is a primary care patient of Ann Held, DO. The care management team was consulted for assistance with disease management and care coordination needs.    Engaged with patient by telephone for follow up visit in response to provider referral for case management and/or care coordination services.   Consent to Services:  The patient was given information about Chronic Care Management services, agreed to services, and gave verbal consent prior to initiation of services.  Please see initial visit note for detailed documentation.   Patient agreed to services and verbal consent obtained.   Assessment: Review of patient past medical history, allergies, medications, health status, including review of consultants reports, laboratory and other test data, was performed as part of comprehensive evaluation and provision of chronic care management services.   SDOH (Social Determinants of Health) assessments and interventions performed:    CCM Care Plan  Allergies  Allergen Reactions   Atorvastatin Other (See Comments)    Significant rise in liver tests   Codeine Nausea And Vomiting   Diclofenac Other (See Comments)    Elevated LFT's   Doxycycline     Extreme nausea   Sulfonamide Derivatives Swelling    Outpatient Encounter Medications as of 05/10/2022  Medication Sig   albuterol (VENTOLIN HFA) 108 (90 Base) MCG/ACT inhaler INHALE 2 PUFFS INTO THE LUNGS EVERY 4 (FOUR) HOURS AS NEEDED. FOR SHORTNESS OF BREATH AND WHEEZING (Patient not taking: Reported on 04/11/2022)   Aspirin-Caffeine (BAYER BACK & BODY) 500-32.5 MG TABS Take 1 tablet by mouth daily as needed. Reports takes 1-2 as needed   blood glucose meter kit and supplies Dispense based on patient and insurance preference. Use once a day.  DX Code: E11.9    donepezil (ARICEPT) 5 MG tablet TAKE 1 TABLET BY MOUTH EVERYDAY AT BEDTIME   DULoxetine (CYMBALTA) 20 MG capsule TAKE 1 CAPSULE (20 MG TOTAL) BY MOUTH DAILY. TAKE 1 PO W/ 60 MG=80 MG DAY   DULoxetine (CYMBALTA) 60 MG capsule Take 1 capsule (60 mg total) by mouth daily.   glucose blood test strip Use as instructed once a day.  Dx Code: E11.9   haloperidol (HALDOL) 1 MG tablet Take 1 tablet (1 mg total) by mouth 2 (two) times daily at 8 am and 4 pm.   hydrocortisone-pramoxine (ANALPRAM HC) 2.5-1 % rectal cream Place 1 application rectally 3 (three) times daily. (Patient not taking: Reported on 01/25/2022)   Lancets MISC Use as directed once a day.  Dx code: E11.9   levothyroxine (SYNTHROID) 50 MCG tablet TAKE 1 TABLET BY MOUTH EVERY DAY BEFORE BREAKFAST   lidocaine (LIDODERM) 5 % Place 1 patch onto the skin daily as needed. Remove & Discard patch within 12 hours or as directed by MD   LORazepam (ATIVAN) 1 MG tablet Take 0.5-1 tablets (0.5-1 mg total) by mouth 2 (two) times daily as needed for anxiety.   meloxicam (MOBIC) 7.5 MG tablet Take 1 tablet (7.5 mg total) by mouth daily as needed for pain.   metFORMIN (GLUCOPHAGE) 1000 MG tablet Take 1 tablet (1,000 mg total) by mouth 2 (two) times daily with a meal.   nystatin powder Apply 1 application  topically 3 (three) times daily.   ondansetron (ZOFRAN) 4 MG tablet Take 1 tablet (4 mg total) by mouth every 8 (eight) hours as  needed for nausea or vomiting.   prazosin (MINIPRESS) 2 MG capsule TAKE 1 CAPSULE BY MOUTH AT BEDTIME.   QUEtiapine (SEROQUEL) 200 MG tablet TAKE 1 TABLET BY MOUTH AT BEDTIME.   No facility-administered encounter medications on file as of 05/10/2022.    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   Degenerative  lumbar spinal stenosis    Anterolisthesis of lumbar spine    Lump in upper inner quadrant of left breast 07/13/2021   Left flank pain 07/13/2021   Lumbar radiculopathy 01/26/2021   Body mass index (BMI) 36.0-36.9, adult 01/11/2021   Degeneration of lumbar intervertebral disc 08/22/2020   Retrolisthesis of vertebrae 08/22/2020   Foraminal stenosis of lumbar region 08/22/2020   Generalized anxiety disorder 09/01/2018   Type II diabetes mellitus 06/05/2018   Tinea corporis 09/01/2017   Left upper quadrant pain 09/01/2017   Estrogen deficiency 09/01/2017   Thoracic aortic atherosclerosis 05/14/2017   COPD (chronic obstructive pulmonary disease) 10/30/2016   Allergic rhinitis 10/03/2016   Palpitations 07/20/2016   DOE (dyspnea on exertion) 07/20/2016   External hemorrhoid 08/01/2015   Stress incontinence, female 08/01/2015   Dermatitis 06/02/2015   Shingles outbreak 04/14/2015   Hyperlipidemia associated with type 2 diabetes mellitus ([place])    Fibromyalgia 10/14/2014   Bilateral hip pain 09/02/2014   Deformity of finger 07/15/2014   Ceruminosis 03/04/2014   Tinnitus 03/04/2014   Torticollis 03/04/2014   Bipolar 2 disorder 12/03/2013   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-sided sciatica 08/06/2012   Lower abdominal pain 08/06/2012   Otitis media 09/05/2010   Peripheral neuropathy 02/22/2010   Lumbar back pain with radiculopathy affecting left lower extremity 01/24/2010   Cervicalgia 06/20/2007   Chronic low back pain 06/20/2007   Essential (primary) hypertension 04/23/2007   Inflammatory disease of uterus 04/23/2007    Conditions to be addressed/monitored:HTN, DMII, and chronic pain  Care Plan : RN Care Manager Plan of Care  Updates made by Luretha Rued, RN since 05/10/2022 12:00 AM  Completed 05/10/2022   Problem: No plan of Care Established for Managment of [place] Disease Resolved 05/10/2022  Priority: High     Long-Range Goal: Developement of Plan of Care for  Chronic Disease Management and/or care coordination needs Completed 05/10/2022  Start Date: 11/07/2021  Expected End Date: 05/08/2022  Priority: High  Note:   Goals met: Case closed Current Barriers: RNCM spoke with daughter Pincus Large [facility]), who assist with patient's health management. Ms. Delbert Phenix reports Ms. Zingale no longer has any care management needs and request to close out of the program. She denies any issues with blood pressure. A1C 5.8.  Knowledge Deficits related to plan of care for management of HTN, DMII, and decreased mobility, chronic pain  Chronic Disease Management support and education needs related to HTN, DMII, and chronic pain Chronic low back pain limiting mobility Does not routinely check blood sugar levels Does not have blood pressure monitor Mental health challenges(followed by Psychiatry) RNCM Clinical Goal(s):  Patient will verbalize understanding of plan for management of HTN and DMII as evidenced by self report, caregiver report, chart notiation take all medications exactly as prescribed and will call provider for medication related questions as evidenced by sefl report, caregiver report, chart notation    demonstrate improved health management independence  as evidenced by report of health management       through collaboration with RN Care manager, provider, and care team.   Interventions: 1:1 collaboration with primary care provider regarding development and update of comprehensive plan of care as evidenced by provider attestation and co-signature Inter-disciplinary care team collaboration (see longitudinal plan of care) Evaluation of current treatment plan related to  self management and patient's adherence to plan as established by provider Positive feedback provided to patient regarding effort in management of her health  Community Resource Need Intervention:  (Status:  Goal Met.)  Long Term Goal Evaluation of current treatment plan related to HTN and DMII,  Mental Health Concerns  self-management and patient's adherence to plan as established by provider Encouraged to continue to work with LCSW and Care Guide for community resources as needed  Diabetes Interventions:  (Status:  Goal Met.) Long Term Goal Assessed patient's understanding of A1c goal: <7% Reviewed medications with patient and discussed importance of medication adherence Discussed plans with patient for ongoing care management follow up and provided patient with direct contact information for care management team Encouraged blood sugar checks Lab Results  Component Value Date   HGBA1C 6.4 10/27/2021  Hypertension Interventions:  (Status:  Goal Met.) Long Term Goal Last practice recorded BP readings:  BP Readings from Last 3 Encounters:  04/23/22 120/90  01/22/22 (!) 142/80  12/22/21 (!) 109/55  Most recent eGFR/CrCl: No results found for: EGFR  No components found for: CRCL  Evaluation of current treatment plan related to hypertension self management and patient's adherence to plan as established by provider Reviewed medications with patient and discussed importance of compliance   Encouraged to obtain blood pressure monitor and begin routine checks  Pain Interventions:  (Status:  Goal Met.) Long Term Goal Daughter reports pain managed Per daughter, Chronic back pain/stenosis back is improved managed Upcoming appointments reviewed.  Continue to take medications as prescribed Encouraged to remain active and continue water therapy    Patient Goals/Self-Care Activities: Take medications as prescribed   Attend all scheduled provider appointments Begin to check blood sugar per provider recommendation. Goal: fasting blood sugar 80-130 and <180 after a meal Consider checking and recording blood pressure and take to provider office appointments. Eat Healthy: foods with whole grains, fruits and vegetables, lean meats, healthy fats. Monitor salt intake and avoid sugar in  diet. Contact Provider with any health questions or concerns   Plan:No further follow up required: Patient/Caregiver to contact primary care provider if any care management needs in the future.  Thea Silversmith, RN, MSN, BSN, CCM Care Management Coordinator [facility] [phone]

## 2022-05-10 NOTE — Patient Instructions (Signed)
Visit Information  Thank you for allowing me to share the care management and care coordination services that are available to you as part of your health plan and services through your primary care provider and medical home. Please reach out to me at [phone] if the care management/care coordination team may be of assistance to you in the future.   Lyniah Fujita, RN, MSN, BSN, CCM Care Management Coordinator [facility] [phone]  

## 2022-05-11 DIAGNOSIS — I10 Essential (primary) hypertension: Secondary | ICD-10-CM | POA: Diagnosis not present

## 2022-05-11 DIAGNOSIS — E785 Hyperlipidemia, unspecified: Secondary | ICD-10-CM

## 2022-05-11 DIAGNOSIS — E1159 Type 2 diabetes mellitus with other circulatory complications: Secondary | ICD-10-CM

## 2022-05-15 DIAGNOSIS — M545 Low back pain, unspecified: Secondary | ICD-10-CM | POA: Diagnosis not present

## 2022-05-15 DIAGNOSIS — R2689 Other abnormalities of gait and mobility: Secondary | ICD-10-CM | POA: Diagnosis not present

## 2022-05-17 ENCOUNTER — Encounter: Payer: Self-pay | Admitting: Physician Assistant

## 2022-05-17 ENCOUNTER — Telehealth (INDEPENDENT_AMBULATORY_CARE_PROVIDER_SITE_OTHER): Payer: Medicare Other | Admitting: Physician Assistant

## 2022-05-17 ENCOUNTER — Telehealth: Payer: Medicare Other

## 2022-05-17 ENCOUNTER — Other Ambulatory Visit: Payer: Self-pay | Admitting: Physician Assistant

## 2022-05-17 DIAGNOSIS — F99 Mental disorder, not otherwise specified: Secondary | ICD-10-CM | POA: Diagnosis not present

## 2022-05-17 DIAGNOSIS — F411 Generalized anxiety disorder: Secondary | ICD-10-CM | POA: Diagnosis not present

## 2022-05-17 DIAGNOSIS — F515 Nightmare disorder: Secondary | ICD-10-CM | POA: Diagnosis not present

## 2022-05-17 DIAGNOSIS — F3181 Bipolar II disorder: Secondary | ICD-10-CM

## 2022-05-17 DIAGNOSIS — R413 Other amnesia: Secondary | ICD-10-CM

## 2022-05-17 DIAGNOSIS — F5105 Insomnia due to other mental disorder: Secondary | ICD-10-CM | POA: Diagnosis not present

## 2022-05-17 NOTE — Telephone Encounter (Signed)
[name]'s daughter called after the appointment today to ask for a renewed prescription for the Lorazepam.  She failed to mention that the prescription needs to now be sent in as '1mg'$  qHS plus 1/2 daily as needed.  She wasn't sure that it got relayed that [name] now needed it daily not as needed only.  [facility] to [facility] on [address] [facility].  Next appt 8/7

## 2022-05-17 NOTE — Progress Notes (Signed)
[facility] Check  Patient ID: Megan Duncan,  MRN: [id number]  PCP: Megan Held, DO  Date of Evaluation: 05/17/2022  Time spent:20 minutes  Chief Complaint:  Chief Complaint   Anxiety; Depression; Follow-up    Virtual Visit via Telehealth  I connected with patient by a video enabled telemedicine application with their informed consent, and verified patient privacy and that I am speaking with the correct person using two identifiers.  I am private, in my office and the patient is at home.  I discussed the limitations, risks, security and privacy concerns of performing an evaluation and management service by video and the availability of in person appointments. I also discussed with the patient that there may be a patient responsible charge related to this service. The patient expressed understanding and agreed to proceed.   I discussed the assessment and treatment plan with the patient. The patient was provided an opportunity to ask questions and all were answered. The patient agreed with the plan and demonstrated an understanding of the instructions.   The patient was advised to call back or seek an in-person evaluation if the symptoms worsen or if the condition fails to improve as anticipated.  I provided 20  minutes of non-face-to-face time during this encounter.   HISTORY/CURRENT STATUS: HPI For routine follow-up.  Daughter Megan Duncan is on video.   A little over a month ago we increased the Cymbalta to a total of 80 mg.  Her anxiety might be a little bit better, she is only taking the Ativan 1 mg at night, not taking it during the day hardly ever and that has worked out okay.  She is not having panic attacks but does feel overwhelmed and irritable at times.  Megan Duncan states she is sleeping a lot, she prefers to stay home and in bed more than do anything else.  She still has a lot of back pain although it is fairly well controlled, she is only taking Bayer aspirin now,  not the meloxicam anymore.  Megan Duncan does not want to play bridge or bunko anymore.  Says she does what ever she can to get out of doing it.  She would just as soon stay home and not go to the trouble of going out.  She stays at home all the time, either her husband or daughter are there to fix food and do things around the house.  Her personal hygiene is normal.  Appetite is good.  No suicidal or homicidal thoughts.  Never has increased energy, decreased need for sleep, no grandiosity, no increased spending, no paranoia or hallucinations.  Review of Systems  Constitutional:  Positive for malaise/fatigue.  HENT: Negative.    Eyes: Negative.   Respiratory: Negative.    Cardiovascular: Negative.   Gastrointestinal: Negative.   Genitourinary: Negative.   Musculoskeletal:  Positive for back pain.  Skin: Negative.   Neurological: Negative.   Endo/Heme/Allergies: Negative.   Psychiatric/Behavioral:         See HPI   Individual Medical History/ Review of Systems: Changes? :No    Past medications for mental health diagnoses include: Trileptal Lamictal Prozac Zoloft Effexor XR, Cymbalta, Paxil, lithium, Depakote, Latuda, Risperdal, Tegretol, Wellbutrin, propranolol for tremor, Thorazine  Haldol and Zyprexa given for a few days inpatient January 2023, propranolol for lithium tremor  Hospitalized in January 2023 at [facility] behavioral health for altered mental status with hallucinations, paranoia, and aggressive behavior  Allergies: Atorvastatin, Codeine, Diclofenac, Doxycycline, and Sulfonamide derivatives  Current Medications:  Current Outpatient Medications:    Aspirin-Caffeine (BAYER BACK & BODY) 500-32.5 MG TABS, Take 1 tablet by mouth daily as needed. Reports takes 1-2 as needed, Disp: , Rfl:    blood glucose meter kit and supplies, Dispense based on patient and insurance preference. Use once a day.  DX Code: E11.9, Disp: 1 each, Rfl: 0   donepezil (ARICEPT) 5 MG tablet, TAKE 1  TABLET BY MOUTH EVERYDAY AT BEDTIME, Disp: 90 tablet, Rfl: 0   DULoxetine (CYMBALTA) 20 MG capsule, TAKE 1 CAPSULE (20 MG TOTAL) BY MOUTH DAILY. TAKE 1 PO W/ 60 MG=80 MG DAY, Disp: 90 capsule, Rfl: 0   DULoxetine (CYMBALTA) 60 MG capsule, Take 1 capsule (60 mg total) by mouth daily., Disp: 30 capsule, Rfl: 2   glucose blood test strip, Use as instructed once a day.  Dx Code: E11.9, Disp: 30 each, Rfl: 2   haloperidol (HALDOL) 1 MG tablet, Take 1 tablet (1 mg total) by mouth 2 (two) times daily at 8 am and 4 pm., Disp: 60 tablet, Rfl: 2   Lancets MISC, Use as directed once a day.  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 Remove & Discard patch within 12 hours or as directed by MD, Disp: 30 patch, Rfl: 0   LORazepam (ATIVAN) 1 MG tablet, Take 0.5-1 tablets (0.5-1 mg total) by mouth 2 (two) times daily as needed for anxiety., Disp: 60 tablet, Rfl: 2   metFORMIN (GLUCOPHAGE) 1000 MG tablet, Take 1 tablet (1,000 mg total) by mouth 2 (two) times daily with a meal., Disp: 60 tablet, Rfl: 2   prazosin (MINIPRESS) 2 MG capsule, TAKE 1 CAPSULE BY MOUTH AT BEDTIME., Disp: 90 capsule, Rfl: 1   QUEtiapine (SEROQUEL) 200 MG tablet, TAKE 1 TABLET BY MOUTH AT BEDTIME., Disp: 90 tablet, Rfl: 1   albuterol (VENTOLIN HFA) 108 (90 Base) MCG/ACT inhaler, INHALE 2 PUFFS INTO THE LUNGS EVERY 4 (FOUR) HOURS AS NEEDED. FOR SHORTNESS OF BREATH AND WHEEZING (Patient not taking: Reported on 04/11/2022), Disp: 18 each, Rfl: 3   hydrocortisone-pramoxine (ANALPRAM HC) 2.5-1 % rectal cream, Place 1 application rectally 3 (three) times daily. (Patient not taking: Reported on 01/25/2022), Disp: 30 g, Rfl: 3   nystatin powder, Apply 1 application  topically 3 (three) times daily. (Patient not taking: Reported on 05/17/2022), Disp: 15 g, Rfl: 0    ondansetron (ZOFRAN) 4 MG tablet, Take 1 tablet (4 mg total) by mouth every 8 (eight) hours as needed for nausea or vomiting. (Patient not taking: Reported on 05/17/2022), Disp: 15 tablet, Rfl: 0 Medication Side Effects: none  Family Medical/ Social History: Changes? No  MENTAL HEALTH EXAM:  There were no vitals taken for this visit.There is no height or weight on file to calculate BMI.  General Appearance: Casual, Fairly Groomed, Obese, and she is sitting up in bed, in a house robe  Eye Contact:  Good  Speech:  Clear and Coherent and Normal Rate  Volume:  Normal  Mood:  Depressed and Irritable  Affect:  Depressed  Thought Process:  Goal Directed and Descriptions of Associations: Circumstantial  Orientation:  Full (Time, Place, and Person)  Thought Content: Logical   Suicidal Thoughts:  No  Homicidal Thoughts:  No  Memory:  Immediate;   Poor Recent;   Poor Remote;  Good  Judgement:  Good  Insight:  Good  Psychomotor Activity:   Unable to assess as she is sitting in bed.  Upper extremities and face movements appear to be normal    Concentration:  Concentration: Good and Attention Span: Good  Recall:  Good  Fund of Knowledge: Good  Language: Good  Assets:  Desire for Improvement  ADL's:  Intact  Cognition: WNL  Prognosis:  Good    DIAGNOSES:    ICD-10-CM   1. Bipolar II disorder ([place])  F31.81     2. Generalized anxiety disorder  F41.1     3. Nightmares  F51.5     4. Insomnia due to other mental disorder  F51.05    F99     5. Memory loss  R41.3       Receiving Psychotherapy: No    RECOMMENDATIONS:  PDMP reviewed.  Ativan filled 04/13/2022. I provided 20 minutes of non-face-to-face time during this encounter, including time spent before and after the visit in records review, medical decision making, counseling pertinent to today's visit, and charting.  Megan Duncan does not want to make any medication changes at this time.  She does not feel that anything can be done to  make her feel any better.  She has been on the current dose of Cymbalta for only 4 to 5 weeks and she may have even more improvement over the next 2 to 3 weeks so I agree to leave the dose the same and weight for that time to go by, reassess in a month.  She and her daughter agree. As far as the anxiety goes it is fine for her to take the Ativan 1 mg at night and then 1/2 pill during the day if needed but try not to take it during the day because of the sedative effects.  Continue Aricept 5 mg, 1 p.o. nightly. Continue Cymbalta 80 mg daily. Continue Haldol 1 mg, 1 p.o. twice daily. Continue Ativan 1 mg, 1 p.o. nightly but also can take 1/2-1 po during day prn.  Continue prazosin 2 mg p.o. nightly Continue Seroquel 200 mg, 1 p.o. nightly. Return in 4 weeks.  Donnal Moat, PA-C

## 2022-05-17 NOTE — Telephone Encounter (Signed)
Yes, please do #45 pills 1/2 in day prn and 1 qhs routine. 5 RF is good. Thanks

## 2022-05-17 NOTE — Telephone Encounter (Signed)
Should I change instructions when I pend?currently shows Take 0.5-1 tablets (0.5-1 mg total) by mouth 2 (two) times daily as needed for anxiety #60

## 2022-05-18 MED ORDER — LORAZEPAM 1 MG PO TABS: ORAL_TABLET | ORAL | 5 refills | Status: DC

## 2022-05-22 DIAGNOSIS — R2689 Other abnormalities of gait and mobility: Secondary | ICD-10-CM | POA: Diagnosis not present

## 2022-05-22 DIAGNOSIS — M545 Low back pain, unspecified: Secondary | ICD-10-CM | POA: Diagnosis not present

## 2022-05-29 ENCOUNTER — Other Ambulatory Visit: Payer: Self-pay | Admitting: Physician Assistant

## 2022-05-30 ENCOUNTER — Ambulatory Visit: Payer: Medicare Other | Admitting: Physician Assistant

## 2022-05-31 DIAGNOSIS — H2511 Age-related nuclear cataract, right eye: Secondary | ICD-10-CM | POA: Diagnosis not present

## 2022-05-31 DIAGNOSIS — E119 Type 2 diabetes mellitus without complications: Secondary | ICD-10-CM | POA: Diagnosis not present

## 2022-05-31 DIAGNOSIS — H35342 Macular cyst, hole, or pseudohole, left eye: Secondary | ICD-10-CM | POA: Diagnosis not present

## 2022-05-31 DIAGNOSIS — H524 Presbyopia: Secondary | ICD-10-CM | POA: Diagnosis not present

## 2022-05-31 LAB — HM DIABETES EYE EXAM

## 2022-06-01 ENCOUNTER — Encounter: Payer: Self-pay | Admitting: *Deleted

## 2022-06-06 ENCOUNTER — Other Ambulatory Visit: Payer: Self-pay | Admitting: Physician Assistant

## 2022-06-06 ENCOUNTER — Other Ambulatory Visit: Payer: Self-pay | Admitting: Family Medicine

## 2022-06-06 DIAGNOSIS — H02135 Senile ectropion of left lower eyelid: Secondary | ICD-10-CM | POA: Diagnosis not present

## 2022-06-06 DIAGNOSIS — H02132 Senile ectropion of right lower eyelid: Secondary | ICD-10-CM | POA: Diagnosis not present

## 2022-06-06 DIAGNOSIS — Z01818 Encounter for other preprocedural examination: Secondary | ICD-10-CM | POA: Diagnosis not present

## 2022-06-08 ENCOUNTER — Ambulatory Visit (INDEPENDENT_AMBULATORY_CARE_PROVIDER_SITE_OTHER): Payer: Medicare Other | Admitting: Family Medicine

## 2022-06-08 ENCOUNTER — Other Ambulatory Visit: Payer: Self-pay | Admitting: Family Medicine

## 2022-06-08 VITALS — BP 118/80 | HR 99 | Temp 98.3°F | Resp 18 | Ht 62.0 in

## 2022-06-08 DIAGNOSIS — B354 Tinea corporis: Secondary | ICD-10-CM | POA: Diagnosis not present

## 2022-06-08 DIAGNOSIS — N760 Acute vaginitis: Secondary | ICD-10-CM | POA: Diagnosis not present

## 2022-06-08 MED ORDER — NAFTIFINE HCL 2 % EX CREA: TOPICAL_CREAM | CUTANEOUS | 2 refills | Status: DC

## 2022-06-08 MED ORDER — FLUCONAZOLE 150 MG PO TABS: ORAL_TABLET | ORAL | 0 refills | Status: DC

## 2022-06-08 NOTE — Patient Instructions (Signed)
Body Ringworm Body ringworm is an infection of the skin that often causes a ring-shaped rash. Body ringworm is also called tinea corporis. Body ringworm can affect any part of your skin. This condition is easily spread from person to person (is very contagious). What are the causes? This condition is caused by fungi called dermatophytes. The condition develops when these fungi grow out of control on the skin. You can get this condition if you touch a person or animal that has it. You can also get it if you share any items with an infected person or pet. These include: Clothing, bedding, and towels. Brushes or combs. Gym equipment. Any other object that has the fungus on it. What increases the risk? You are more likely to develop this condition if you: Play sports that involve close physical contact, such as wrestling. Sweat a lot. Live in areas that are hot and humid. Use public showers. Have a weakened immune system. What are the signs or symptoms? Symptoms of this condition include: Itchy, raised red spots and bumps. Red scaly patches. A ring-shaped rash. The rash may have: A clear center. Scales or red bumps at its center. Redness near its borders. Dry and scaly skin on or around it. How is this diagnosed? This condition can usually be diagnosed with a skin exam. A skin scraping may be taken from the affected area and examined under a microscope to see if the fungus is present. How is this treated? This condition may be treated with: An antifungal cream or ointment. An antifungal shampoo. Antifungal medicines. These may be prescribed if your ringworm: Is severe. Keeps coming back. Lasts a long time. Follow these instructions at home: Take over-the-counter and prescription medicines only as told by your health care provider. If you were given an antifungal cream or ointment: Use it as told by your health care provider. Wash the infected area and dry it completely before  applying the cream or ointment. If you were given an antifungal shampoo: Use it as told by your health care provider. Leave the shampoo on your body for 3-5 minutes before rinsing. While you have a rash: Wear loose clothing to stop clothes from rubbing and irritating it. Wash or change your bed sheets every night. Disinfect or throw out items that may be infected. Wash clothes and bed sheets in hot water. Wash your hands often with soap and water. If soap and water are not available, use hand sanitizer. If your pet has the same infection, take your pet to see a veterinarian for treatment. How is this prevented? Take a bath or shower every day and after every time you work out or play sports. Dry your skin completely after bathing. Wear sandals or shoes in public places and showers. Change your clothes every day. Wash athletic clothes after each use. Do not share personal items with others. Avoid touching red patches of skin on other people. Avoid touching pets that have bald spots. If you touch an animal that has a bald spot, wash your hands. Contact a health care provider if: Your rash continues to spread after 7 days of treatment. Your rash is not gone in 4 weeks. The area around your rash gets red, warm, tender, and swollen. Summary Body ringworm is an infection of the skin that often causes a ring-shaped rash. This condition is easily spread from person to person (is very contagious). This condition may be treated with antifungal cream or ointment, antifungal shampoo, or antifungal medicines. Take over-the-counter and   prescription medicines only as told by your health care provider. This information is not intended to replace advice given to you by your health care provider. Make sure you discuss any questions you have with your health care provider. Document Revised: 08/22/2021 Document Reviewed: 08/22/2021 Elsevier Patient Education  2023 [employer]  

## 2022-06-08 NOTE — Progress Notes (Signed)
Subjective:   By signing my name below, I, Megan Duncan, attest that this documentation has been prepared under the direction and in the presence of Megan Held, DO 06/08/2022   Patient ID: Megan Duncan, female    DOB: October 04, 1946, 76 y.o.   MRN: [id number]  Chief Complaint  Patient presents with   Rash    Pt states having a rash under breast, stomach, and vagina    HPI Patient is in today for follow up visit.  She reports having an itchy rash under her right breast and lower abdominal region. She adds that she has the rash on her vaginal area as well, and the powder she was prescribed has not improved her symptoms. She denies having any discharge in her vaginal area.  She reports that her hearing has been worsening.    Past Medical History:  Diagnosis Date   Acute pain of right shoulder 09/24/2018   Acute upper respiratory infection 09/19/2010   Allergic rhinitis 10/03/2016   Anterolisthesis of lumbar spine    Arthritis    hands   Bilateral hip pain 09/02/2014   Bipolar II disorder    Cellulitis and abscess of trunk 09/15/2009   Ceruminosis 03/04/2014   Cervicalgia 06/20/2007   Chronic low back pain 06/20/2007   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-sided sciatica 08/06/2012   COPD (chronic obstructive pulmonary disease) (HCC)    Deformity of finger 07/15/2014   Degeneration of lumbar intervertebral disc 08/22/2020   Degenerative lumbar spinal stenosis    Dermatitis 36/62/9476   Diastolic dysfunction    Per pt, diagnosed after [facility]   Diverticulosis    DOE (dyspnea on exertion) 07/20/2016   Essential (primary) hypertension 04/23/2007   Estrogen deficiency 09/01/2017   External hemorrhoid 08/01/2015   Fibromyalgia    Foraminal stenosis of lumbar region 08/22/2020   Generalized anxiety disorder 09/01/2018   Hyperlipidemia    Inflammatory disease of uterus 04/23/2007   Left flank pain 07/13/2021   Left upper quadrant pain 09/01/2017   Lower  abdominal pain 08/06/2012   Lumbar back pain with radiculopathy affecting left lower extremity 01/24/2010   Lumbar radiculopathy 01/26/2021   Lump in upper inner quadrant of left breast 07/13/2021   Major depressive disorder    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   Otitis media 09/05/2010   Palpitations 07/20/2016   Peripheral neuropathy 02/22/2010   Retrolisthesis of vertebrae 08/22/2020   Shingles outbreak 04/14/2015   Sinusitis, acute maxillary 09/14/2013   Stress incontinence, female 08/01/2015   Thoracic aortic atherosclerosis 05/14/2017   Tinea corporis 09/01/2017   Tinnitus 03/04/2014   Torticollis 03/04/2014   Type II diabetes mellitus 06/05/2018    Past Surgical History:  Procedure Laterality Date   APPENDECTOMY     EYE SURGERY     KNEE ARTHROSCOPY Right 11/12/2006   TONSILLECTOMY     TOTAL ABDOMINAL HYSTERECTOMY      Family History  Problem Relation Age of Onset   Hypertension Mother    Hiatal hernia Mother    Diabetes Father    Hypertension Father    Heart attack Father    Alcoholism Father    Bipolar disorder Father    Mental illness Sister    Suicidality Sister    Colon cancer Maternal Aunt 80   Breast cancer Maternal Aunt    Bipolar disorder Paternal Aunt     Social History   Socioeconomic History   Marital status: Married    Spouse  name: Megan Duncan   Number of children: 3   Years of education: 14   Highest education level: Some college, no degree  Occupational History   Occupation: retired  Tobacco Use   Smoking status: Former    Packs/day: 1.50    Years: 40.00    Total pack years: 60.00    Types: Cigarettes    Quit date: 07/02/2004    Years since quitting: 17.9    Passive exposure: Past   Smokeless tobacco: Never   Tobacco comments:    Verified by Megan Duncan (Husband), & Megan Duncan (Daughter)  Vaping Use   Vaping Use: Never used  Substance and Sexual Activity   Alcohol use: Yes     Alcohol/week: 0.0 - 1.0 standard drinks of alcohol    Comment: occasional wine   Drug use: No   Sexual activity: Never  Other Topics Concern   Not on file  Social History Narrative   Lives with husband and grandson she is raising.   11/07/21 Lives with husband   Social Determinants of Health   Financial Resource Strain: Medium Risk (11/29/2021)   Overall Financial Resource Strain (CARDIA)    Difficulty of Paying Living Expenses: Somewhat hard  Food Insecurity: No Food Insecurity (11/29/2021)   Hunger Vital Sign    Worried About Running Out of Food in the Last Year: Never true    [place] in the Last Year: Never true  Transportation Needs: No Transportation Needs (11/29/2021)   PRAPARE - Hydrologist (Medical): No    Lack of Transportation (Non-Medical): No  Physical Activity: Inactive (11/29/2021)   Exercise Vital Sign    Days of Exercise per Week: 0 days    Minutes of Exercise per Session: 0 min  Stress: No Stress Concern Present (11/29/2021)   [place] of Stress : Not at all  Social Connections: [place] (11/29/2021)   Social Connection and Isolation Panel [NHANES]    Frequency of Communication with Friends and Family: More than three times a week    Frequency of Social Gatherings with Friends and Family: More than three times a week    Attends Religious Services: 1 to 4 times per year    Active Member of [employer] or Organizations: Yes    Attends Music therapist: More than 4 times per year    Marital Status: Married  Human resources officer Violence: Not At Risk (11/29/2021)   Humiliation, Afraid, Rape, and Kick questionnaire    Fear of Current or Ex-Partner: No    Emotionally Abused: No    Physically Abused: No    Sexually Abused: No    Outpatient Medications Prior to Visit  Medication Sig Dispense Refill   Aspirin-Caffeine (BAYER BACK & BODY)  500-32.5 MG TABS Take 1 tablet by mouth daily as needed. Reports takes 1-2 as needed     blood glucose meter kit and supplies Dispense based on patient and insurance preference. Use once a day.  DX Code: E11.9 1 each 0   donepezil (ARICEPT) 5 MG tablet TAKE 1 TABLET BY MOUTH EVERYDAY AT BEDTIME 90 tablet 0   glucose blood test strip Use as instructed once a day.  Dx Code: E11.9 30 each 2   Lancets MISC Use as directed once a day.  Dx code: E11.9 30 each 2   levothyroxine (SYNTHROID) 50 MCG tablet TAKE 1 TABLET BY MOUTH EVERY DAY BEFORE  BREAKFAST 90 tablet 1   lidocaine (LIDODERM) 5 % Place 1 patch onto the skin daily as needed. Remove & Discard patch within 12 hours or as directed by MD 30 patch 0   LORazepam (ATIVAN) 1 MG tablet Take 1/2 tab daily as needed and 1 tab at bedtime. 45 tablet 5   prazosin (MINIPRESS) 2 MG capsule TAKE 1 CAPSULE BY MOUTH AT BEDTIME. 90 capsule 1   QUEtiapine (SEROQUEL) 200 MG tablet TAKE 1 TABLET BY MOUTH AT BEDTIME. 90 tablet 1   DULoxetine (CYMBALTA) 20 MG capsule TAKE 1 CAPSULE (20 MG TOTAL) BY MOUTH DAILY. TAKE 1 PO W/ 60 MG=80 MG DAY 90 capsule 0   DULoxetine (CYMBALTA) 60 MG capsule Take 1 capsule (60 mg total) by mouth daily. 30 capsule 2   haloperidol (HALDOL) 1 MG tablet TAKE 1 TABLET (1 MG TOTAL) BY MOUTH 2 (TWO) TIMES DAILY AT 8 AM AND 4 PM. 60 tablet 0   metFORMIN (GLUCOPHAGE) 1000 MG tablet Take 1 tablet (1,000 mg total) by mouth 2 (two) times daily with a meal. 60 tablet 2   hydrocortisone-pramoxine (ANALPRAM HC) 2.5-1 % rectal cream Place 1 application rectally 3 (three) times daily. (Patient not taking: Reported on 01/25/2022) 30 g 3   albuterol (VENTOLIN HFA) 108 (90 Base) MCG/ACT inhaler INHALE 2 PUFFS INTO THE LUNGS EVERY 4 (FOUR) HOURS AS NEEDED. FOR SHORTNESS OF BREATH AND WHEEZING (Patient not taking: Reported on 04/11/2022) 18 each 3   nystatin powder Apply 1 application  topically 3 (three) times daily. (Patient not taking: Reported on 05/17/2022) 15  g 0   ondansetron (ZOFRAN) 4 MG tablet Take 1 tablet (4 mg total) by mouth every 8 (eight) hours as needed for nausea or vomiting. (Patient not taking: Reported on 05/17/2022) 15 tablet 0   No facility-administered medications prior to visit.    Allergies  Allergen Reactions   Atorvastatin Other (See Comments)    Significant rise in liver tests   Codeine Nausea And Vomiting   Diclofenac Other (See Comments)    Elevated LFT's   Doxycycline     Extreme nausea   Fluconazole Other (See Comments)    Hallucinations    Sulfonamide Derivatives Swelling    Review of Systems  Constitutional:  Negative for fever and malaise/fatigue.  HENT:  Positive for hearing loss. Negative for congestion.   Eyes:  Negative for blurred vision.  Respiratory:  Negative for shortness of breath.   Cardiovascular:  Negative for chest pain, palpitations and leg swelling.  Gastrointestinal:  Negative for abdominal pain, blood in stool and nausea.  Genitourinary:  Negative for dysuria and frequency.  Musculoskeletal:  Negative for falls.  Skin:  Positive for rash.  Neurological:  Negative for dizziness, loss of consciousness and headaches.  Endo/Heme/Allergies:  Negative for environmental allergies.  Psychiatric/Behavioral:  Negative for depression. The patient is not nervous/anxious.        Objective:    Physical Exam Vitals and nursing note reviewed.  Constitutional:      Appearance: Normal appearance. She is well-developed. She is not ill-appearing.  HENT:     Head: Normocephalic and atraumatic.     Right Ear: External ear normal.     Left Ear: External ear normal.  Eyes:     Extraocular Movements: Extraocular movements intact.     Conjunctiva/sclera: Conjunctivae normal.     Pupils: Pupils are equal, round, and reactive to light.  Neck:     Thyroid: No thyromegaly.     Vascular:  No carotid bruit or JVD.  Cardiovascular:     Rate and Rhythm: Normal rate and regular rhythm.     Pulses: Normal  pulses.     Heart sounds: Normal heart sounds. No murmur heard.    No gallop.  Pulmonary:     Effort: Pulmonary effort is normal. No respiratory distress.     Breath sounds: Normal breath sounds. No wheezing or rales.  Chest:     Chest wall: No tenderness.  Musculoskeletal:     Cervical back: Normal range of motion and neck supple.  Skin:    General: Skin is warm and dry.     Findings: Rash present.  Neurological:     Mental Status: She is alert and oriented to person, place, and time.  Psychiatric:        Judgment: Judgment normal.     BP 118/80 (BP Location: Left Arm, Patient Position: Sitting, Cuff Size: Large)   Pulse 99   Temp 98.3 F (36.8 C) (Oral)   Resp 18   Ht _0  (1.575 m)   SpO2 95%   BMI 33.07 kg/m  Wt Readings from Last 3 Encounters:  04/23/22 180 lb 12.8 oz (82 kg)  01/22/22 183 lb 4.8 oz (83.1 kg)  12/03/21 190 lb (86.2 kg)    Diabetic Foot Exam - Simple   No data filed    Lab Results  Component Value Date   WBC 9.6 12/02/2021   HGB 13.9 12/02/2021   HCT 44.0 12/02/2021   PLT 332 12/02/2021   GLUCOSE 86 04/23/2022   CHOL 196 04/23/2022   TRIG 143.0 04/23/2022   HDL 71.10 04/23/2022   LDLDIRECT 120.1 12/03/2013   LDLCALC 96 04/23/2022   ALT 15 04/23/2022   AST 19 04/23/2022   NA 142 04/23/2022   K 4.4 04/23/2022   CL 106 04/23/2022   CREATININE 1.03 04/23/2022   BUN 12 04/23/2022   CO2 27 04/23/2022   TSH 0.93 04/23/2022   INR 1.0 10/25/2015   HGBA1C 5.8 04/23/2022   MICROALBUR 2.7 (H) 09/22/2020    Lab Results  Component Value Date   TSH 0.93 04/23/2022   Lab Results  Component Value Date   WBC 9.6 12/02/2021   HGB 13.9 12/02/2021   HCT 44.0 12/02/2021   MCV 98.4 12/02/2021   PLT 332 12/02/2021   Lab Results  Component Value Date   NA 142 04/23/2022   K 4.4 04/23/2022   CO2 27 04/23/2022   GLUCOSE 86 04/23/2022   BUN 12 04/23/2022   CREATININE 1.03 04/23/2022   BILITOT 0.4 04/23/2022   ALKPHOS 56 04/23/2022   AST  19 04/23/2022   ALT 15 04/23/2022   PROT 6.5 04/23/2022   ALBUMIN 4.0 04/23/2022   CALCIUM 10.0 04/23/2022   ANIONGAP 11 12/02/2021   GFR 53.02 (L) 04/23/2022   Lab Results  Component Value Date   CHOL 196 04/23/2022   Lab Results  Component Value Date   HDL 71.10 04/23/2022   Lab Results  Component Value Date   LDLCALC 96 04/23/2022   Lab Results  Component Value Date   TRIG 143.0 04/23/2022   Lab Results  Component Value Date   CHOLHDL 3 04/23/2022   Lab Results  Component Value Date   HGBA1C 5.8 04/23/2022       Assessment & Plan:   Problem List Items Addressed This Visit       Unprioritized   Tinea corporis - Primary   Other Visit Diagnoses  Acute vaginitis            Meds ordered this encounter  Medications   DISCONTD: Naftifine HCl (NAFTIN) 2 % CREA    Sig: Apply qd    Dispense:  60 g    Refill:  2   DISCONTD: fluconazole (DIFLUCAN) 150 MG tablet    Sig: 1 po x1, may repeat in 3 days prn    Dispense:  2 tablet    Refill:  0    I, Megan Held, DO, personally preformed the services described in this documentation.  All medical record entries made by the scribe were at my direction and in my presence.  I have reviewed the chart and discharge instructions (if applicable) and agree that the record reflects my personal performance and is accurate and complete. 06/08/2022   I,Tinashe Williams,acting as a scribe for Megan Held, DO.,have documented all relevant documentation on the behalf of Megan Held, DO,as directed by  Megan Held, DO while in the presence of Megan Held, DO.    Megan Held, DO

## 2022-06-11 ENCOUNTER — Telehealth: Payer: Self-pay | Admitting: Family Medicine

## 2022-06-11 ENCOUNTER — Telehealth: Payer: Self-pay

## 2022-06-11 NOTE — Telephone Encounter (Signed)
Pt's daughter called and stated there has been an issue with new medications. After starting fluconazole (DIFLUCAN) she started to have hallucinations including hearing a radio playing and the walls started to melt. She has not had this problem since, but this happened the last time she left the hospital after starting psych meds. She was unsure if this was a side effect of this medication or if they may have gotten her psych meds mixed up that day. Also Naftifine HCl (NAFTIN) 2 % CREAM is not covered by ins and they need something else. Please advise if pt should continue diflucan or start something else.

## 2022-06-11 NOTE — Telephone Encounter (Signed)
PA initiated via Covermymeds; KEY: [id number]. Awaiting determination.

## 2022-06-12 ENCOUNTER — Other Ambulatory Visit: Payer: Self-pay | Admitting: Family Medicine

## 2022-06-12 MED ORDER — CLOTRIMAZOLE-BETAMETHASONE 1-0.05 % EX CREA
1.0000 | TOPICAL_CREAM | Freq: Two times a day (BID) | CUTANEOUS | 0 refills | Status: DC
Start: 2022-06-12 — End: 2022-12-27

## 2022-06-12 NOTE — Telephone Encounter (Signed)
Also Naftifine HCl (NAFTIN) 2 % CREAM is not covered by ins and they need something else.

## 2022-06-12 NOTE — Telephone Encounter (Signed)
PA denied. Other covered drug(s) is/are: Clotrimazolebetamethasone external cream 1-0.05%, Clotrimazole external cream 1%, Clotrimazole external solution 1%, Ketoconazole external cream 2%, Oxiconazole Nitrate external cream 1 % (prior authorization required), Ciclopirox Olamine external cream 0.77%, Ciclopirox Olamine external suspension 0.77%

## 2022-06-13 ENCOUNTER — Other Ambulatory Visit: Payer: Self-pay

## 2022-06-13 ENCOUNTER — Other Ambulatory Visit: Payer: Self-pay | Admitting: Family Medicine

## 2022-06-13 MED ORDER — CLOTRIMAZOLE 1 % EX LOTN
TOPICAL_LOTION | CUTANEOUS | 2 refills | Status: DC
Start: 2022-06-13 — End: 2022-12-27

## 2022-06-13 NOTE — Telephone Encounter (Signed)
Spoke with patient's daughter. Pt did not take the second pill and the pharmacy contacted them and was advised that the new cream is on back order

## 2022-06-14 NOTE — Telephone Encounter (Signed)
Spoke with pt's daughter. She states her dad picked up 2 different creams and states the will try them separately and will [name] back if they don't work

## 2022-06-15 DIAGNOSIS — H02131 Senile ectropion of right upper eyelid: Secondary | ICD-10-CM | POA: Diagnosis not present

## 2022-06-15 DIAGNOSIS — H02132 Senile ectropion of right lower eyelid: Secondary | ICD-10-CM | POA: Diagnosis not present

## 2022-06-15 DIAGNOSIS — H02135 Senile ectropion of left lower eyelid: Secondary | ICD-10-CM | POA: Diagnosis not present

## 2022-06-18 ENCOUNTER — Encounter: Payer: Self-pay | Admitting: Physician Assistant

## 2022-06-18 ENCOUNTER — Telehealth (INDEPENDENT_AMBULATORY_CARE_PROVIDER_SITE_OTHER): Payer: Medicare Other | Admitting: Physician Assistant

## 2022-06-18 DIAGNOSIS — F4310 Post-traumatic stress disorder, unspecified: Secondary | ICD-10-CM | POA: Diagnosis not present

## 2022-06-18 DIAGNOSIS — F515 Nightmare disorder: Secondary | ICD-10-CM | POA: Diagnosis not present

## 2022-06-18 DIAGNOSIS — F3181 Bipolar II disorder: Secondary | ICD-10-CM | POA: Diagnosis not present

## 2022-06-18 DIAGNOSIS — F411 Generalized anxiety disorder: Secondary | ICD-10-CM

## 2022-06-18 DIAGNOSIS — F5105 Insomnia due to other mental disorder: Secondary | ICD-10-CM

## 2022-06-18 DIAGNOSIS — F99 Mental disorder, not otherwise specified: Secondary | ICD-10-CM

## 2022-06-18 MED ORDER — DULOXETINE HCL 20 MG PO CPEP: 40.0000 mg | ORAL_CAPSULE | Freq: Every day | ORAL | 0 refills | Status: DC

## 2022-06-18 MED ORDER — DULOXETINE HCL 60 MG PO CPEP: 60.0000 mg | ORAL_CAPSULE | Freq: Every day | ORAL | 2 refills | Status: DC

## 2022-06-18 MED ORDER — HALOPERIDOL 1 MG PO TABS
1.0000 mg | ORAL_TABLET | ORAL | 5 refills | Status: DC
Start: 2022-06-18 — End: 2022-07-16

## 2022-06-18 NOTE — Progress Notes (Unsigned)
[facility]  Patient ID: LEDONNA DORMER,  MRN: [id number]  PCP: Ann Held, DO  Date of Evaluation: 06/18/2022  Time spent:30 minutes  Chief Complaint:  Chief Complaint   Depression; Anxiety; Other; Follow-up     Virtual Visit via Telehealth  I connected with patient by a video enabled telemedicine application (I was unable to hear them, even though their mic showed as 'on')  with their informed consent, and verified patient privacy and that I am speaking with the correct person using two identifiers.  I am private, in my office and the patient is at home.  I discussed the limitations, risks, security and privacy concerns of performing an evaluation and management service by video and the availability of in person appointments. I also discussed with the patient that there may be a patient responsible charge related to this service. The patient expressed understanding and agreed to proceed.   I discussed the assessment and treatment plan with the patient. The patient was provided an opportunity to ask questions and all were answered. The patient agreed with the plan and demonstrated an understanding of the instructions.   The patient was advised to call back or seek an in-person evaluation if the symptoms worsen or if the condition fails to improve as anticipated.  I provided 30  minutes of non-face-to-face time during this encounter.  HISTORY/CURRENT STATUS: HPI For routine follow-up.  Daughter [name], husband [name] are present.  Has been on Cymbalta 80 mg for approx 2 1/2 months. Still feels depressed. Almyra Free states she doesn't have the 'zeal' for life like she did earlier this year. Doesn't want to do things, partly b/c chronic back pain. [name] says she feels down, energy and motivation are low to non-existent, appetite is nl, hygiene is nl. Anxiety has improved but does need the Ativan in the evening to help her relax to go to sleep. Doesn't take during the  day. Sleeping better and nightmares are much better with the prazosin. No SI/HI.  Patient denies increased energy with decreased need for sleep, increased talkativeness, racing thoughts, impulsivity or risky behaviors, increased spending, increased libido, grandiosity, increased irritability or anger, paranoia, and no hallucinations.  Denies dizziness, syncope, seizures, numbness, tingling, tremor, tics, unsteady gait, slurred speech, confusion. Has chronic lumbar pain. No abnl movements.   Individual Medical History/ Review of Systems: Changes? :Yes    Had bilat lower eyelid surgery last week. Has been off the ASA b/c of surgery and has had more back pain.   Past medications for mental health diagnoses include: Trileptal Lamictal Prozac Zoloft Effexor XR, Cymbalta, Paxil, lithium, Depakote, Latuda, Risperdal, Tegretol, Wellbutrin, propranolol for tremor, Thorazine  Haldol and Zyprexa given for a few days inpatient January 2023, propranolol for lithium tremor  Hospitalized in January 2023 at [facility] for altered mental status with hallucinations, paranoia, and aggressive behavior  Allergies: Atorvastatin, Codeine, Diclofenac, Doxycycline, Fluconazole, and Sulfonamide derivatives  Current Medications:  Current Outpatient Medications:    Aspirin-Caffeine (BAYER BACK & BODY) 500-32.5 MG TABS, Take 1 tablet by mouth daily as needed. Reports takes 1-2 as needed, Disp: , Rfl:    blood glucose meter kit and supplies, Dispense based on patient and insurance preference. Use once a day.  DX Code: E11.9, Disp: 1 each, Rfl: 0   Clotrimazole 1 % LOTN, Apply bid prn, Disp: 30 mL, Rfl: 2   clotrimazole-betamethasone (LOTRISONE) cream, Apply 1 Application topically 2 (two) times daily., Disp: 30 g, Rfl: 0  donepezil (ARICEPT) 5 MG tablet, TAKE 1 TABLET BY MOUTH EVERYDAY AT BEDTIME, Disp: 90 tablet, Rfl: 0   glucose blood test strip, Use as instructed once a day.  Dx Code: E11.9,  Disp: 30 each, Rfl: 2   Lancets MISC, Use as directed once a day.  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 Remove & Discard patch within 12 hours or as directed by MD, Disp: 30 patch, Rfl: 0   LORazepam (ATIVAN) 1 MG tablet, Take 1/2 tab daily as needed and 1 tab at bedtime., Disp: 45 tablet, Rfl: 5   metFORMIN (GLUCOPHAGE) 1000 MG tablet, TAKE 1 TABLET (1,000 MG TOTAL) BY MOUTH TWICE A DAY WITH FOOD, Disp: 180 tablet, Rfl: 1   prazosin (MINIPRESS) 2 MG capsule, TAKE 1 CAPSULE BY MOUTH AT BEDTIME., Disp: 90 capsule, Rfl: 1   QUEtiapine (SEROQUEL) 200 MG tablet, TAKE 1 TABLET BY MOUTH AT BEDTIME., Disp: 90 tablet, Rfl: 1   DULoxetine (CYMBALTA) 20 MG capsule, Take 2 capsules (40 mg total) by mouth daily. Take 1 po w/ 60 mg=100 mg day, Disp: 180 capsule, Rfl: 0   DULoxetine (CYMBALTA) 60 MG capsule, Take 1 capsule (60 mg total) by mouth daily. With 40 mg=100 mg, Disp: 30 capsule, Rfl: 2   haloperidol (HALDOL) 1 MG tablet, Take 1 tablet (1 mg total) by mouth 2 (two) times daily at 8 am and 4 pm., Disp: 60 tablet, Rfl: 5   hydrocortisone-pramoxine (ANALPRAM HC) 2.5-1 % rectal cream, Place 1 application rectally 3 (three) times daily. (Patient not taking: Reported on 01/25/2022), Disp: 30 g, Rfl: 3 Medication Side Effects: none  Family Medical/ Social History: Changes? No  MENTAL HEALTH EXAM:  There were no vitals taken for this visit.There is no height or weight on file to calculate BMI.  General Appearance:  unable to assess  Eye Contact:   unable to assess  Speech:  Clear and Coherent and Normal Rate  Volume:  Normal  Mood:  Depressed  Affect:   unable to assess  Thought Process:  Goal Directed and Descriptions of Associations: Circumstantial  Orientation:  Full (Time, Place, and Person)  Thought Content:  Logical   Suicidal Thoughts:  No  Homicidal Thoughts:  No  Memory:  Immediate;   Poor Recent;   Poor Remote;   Good  Judgement:  Good  Insight:  Good  Psychomotor Activity:   unable to assess    Concentration:  Concentration: Good and Attention Span: Good  Recall:  Good  Fund of Knowledge: Good  Language: Good  Assets:  Desire for Improvement  ADL's:  Intact  Cognition: WNL  Prognosis:  Good   Labs 04/23/2022 Glu 86, A1C 5.8 TC 196, Trig 143, HDL 71, LDL 96 See other values on chart  DIAGNOSES:    ICD-10-CM   1. Bipolar II disorder ([place])  F31.81     2. Nightmares  F51.5     3. PTSD (post-traumatic stress disorder)  F43.10     4. Insomnia due to other mental disorder  F51.05    F99     5. Generalized anxiety disorder  F41.1      Receiving Psychotherapy: No    RECOMMENDATIONS:  PDMP reviewed.  Ativan filled 05/18/2022. I provided 30 minutes of non-face-to-face time during this encounter,  including time spent before and after the visit in records review, medical decision making, counseling pertinent to today's visit, and charting.   Recommend increasing Cymbalta for depression. She's been on the current dose to see if it would be effective or not, and although it has helped some, not enough. Pt, Almyra Free, and [place] agree. No other changes.   Continue Aricept 5 mg, 1 p.o. nightly. Increase Cymbalta to 100 mg daily. Continue Haldol 1 mg, 1 p.o. twice daily. Continue Ativan 1 mg, 1 p.o. nightly but also can take 1/2-1 po during day prn.  Continue prazosin 2 mg p.o. nightly Continue Seroquel 200 mg, 1 p.o. nightly. Return in 4 weeks.  Donnal Moat, PA-C

## 2022-06-23 ENCOUNTER — Encounter: Payer: Self-pay | Admitting: Family Medicine

## 2022-06-23 DIAGNOSIS — N760 Acute vaginitis: Secondary | ICD-10-CM | POA: Insufficient documentation

## 2022-06-23 NOTE — Assessment & Plan Note (Signed)
Try naftin  -- consider derm if no better

## 2022-06-23 NOTE — Assessment & Plan Note (Signed)
Diflucan sent in

## 2022-07-11 ENCOUNTER — Telehealth: Payer: Self-pay | Admitting: Physician Assistant

## 2022-07-11 NOTE — Telephone Encounter (Signed)
Daughter called and said mom is having hallucinations, staying up most of the night. She sees people that are not there, goes through drawers looking for her key or looking other places for socks.  She is not aggressive, as she has been previously (January 2023). She is aware afterwards (maybe the next day) that she has done these things and apologies for them, but said she is not able to stop them. Daughter said patient is very sensitive to medications so this may be an issue that would best be left to [place] to handle upon her return. Daughter is aware that Helene Kelp is out of the office with a family emergency. Dtr said patient has no desire to do anything, stays in bed most of the time. An aide comes in on Fridays to help patient with bathing. Family is tired and wonders what the next step may be. The issues described have been occurring for 3-4 weeks.   Family does a pill box for patient and husband/daughter administers.  Aricept 5 QHS Duloxetine 100 mg total Haldol 1 mg 8AM and 4PM Lorazepam 1/2 prn and 1 QHS Prazosin 2 mg QHS Quetiapine 200 mg QHS

## 2022-07-11 NOTE — Telephone Encounter (Signed)
Daughter [name] called reporting Pt having more  Hallucinations in the evening like sundowners. Question possible med adjustment? Stated pt very sensitive to meds. Contact [name] @ [phone]. DPR signed

## 2022-07-12 NOTE — Telephone Encounter (Signed)
From reviewing the notes it is not clear to me how the patient has responded to antipsychotics in the past,.  What was suggested is reasonable.  However if she can tolerate more haloperidol it is a more potent antipsychotic than his Seroquel.  Certainly Seroquel has less risk of EPS but with Seroquel you often have to get the dose closer to about 400 mg to get a decent antipsychotic effect out of it and of course that can be sedating.  There is no right or wrong answer in this situation.  Trial and error may be necessary.  In general dosage changes with antipsychotics can take a week or so to have beneficial effects for hallucinations.

## 2022-07-12 NOTE — Telephone Encounter (Signed)
Megan Duncan, have her increase Seroquel 200 mg, from 1 to 1.5 pills tonight. If she tolerates that ok and is still hallucinating in an hour, take the other half. Then give 2 pills q hs starting tomorrow night. If she becomes combative at any point, go to [facility] or to the ER. Hopefully, this increase will prevent that. Call if further problems or questions. Thanks.

## 2022-07-13 NOTE — Telephone Encounter (Signed)
Called dtr back and recommendations were given. Went over the recommendations 3 times so she could write them down and was clear what was recommended. [name] said in the past [facility] would not take patient due to her age.

## 2022-07-13 NOTE — Telephone Encounter (Signed)
LVM to [facility] 

## 2022-07-16 ENCOUNTER — Telehealth: Payer: Self-pay | Admitting: Physician Assistant

## 2022-07-16 MED ORDER — LORAZEPAM 1 MG PO TABS
ORAL_TABLET | ORAL | 5 refills | Status: DC
Start: 2022-07-16 — End: 2023-02-06

## 2022-07-16 MED ORDER — QUETIAPINE FUMARATE 200 MG PO TABS: 200.0000 mg | ORAL_TABLET | Freq: Every day | ORAL | 1 refills | Status: DC

## 2022-07-16 MED ORDER — DONEPEZIL HCL 5 MG PO TABS: ORAL_TABLET | ORAL | 1 refills | Status: DC

## 2022-07-16 MED ORDER — HALOPERIDOL 1 MG PO TABS
ORAL_TABLET | ORAL | 5 refills | Status: DC
Start: 2022-07-16 — End: 2023-02-06

## 2022-07-16 MED ORDER — DULOXETINE HCL 60 MG PO CPEP: 60.0000 mg | ORAL_CAPSULE | Freq: Every day | ORAL | 1 refills | Status: DC

## 2022-07-16 MED ORDER — PRAZOSIN HCL 2 MG PO CAPS
2.0000 mg | ORAL_CAPSULE | Freq: Every day | ORAL | 1 refills | Status: DC
Start: 2022-07-16 — End: 2023-03-06

## 2022-07-16 MED ORDER — DULOXETINE HCL 20 MG PO CPEP
20.0000 mg | ORAL_CAPSULE | Freq: Every day | ORAL | 1 refills | Status: DC
Start: 2022-07-16 — End: 2022-08-01

## 2022-07-16 NOTE — Telephone Encounter (Signed)
07/16/2022 Pt now with sundowning,  started 1-2 wks after we increased Cymbalta on 06/18/2022. Almyra Free decreased dose back to 80 mg on 07/07/2022, thinking that change may have ben the cause of the sundowning. Then she started having AH/VH, confusion last week, she called office, I recommended increase Seroquel. They didn't increase b/c she had 3 nights where she slept pretty well, no mention on hallucintations. See phone notes.  The past few nights have been really bad. Getting aggressive now, like before having to go to hosp in the winter.   Discussed the sx, Seroquel or Haldol increase is appropriate, Haldol stronger, and I hope will work quicker. Disc Dr. Casimiro Needle note w/ her. We agree to:  Increase the Haldol 1 mg to one q am and 2 pm. Continue Cymbalta total 80 mg daily Continue Ativan 1 mg, 1/2 during day prn and 1 qhs. Continue Prazosin 2 mg qhs. Continue Seroquel 200 mg qhs.  Take night meds at 8 PM. Will send in Rx for all meds to [facility], they are changing pharmacies.  Almyra Free understands. Pt has appt w/ me on 9/6. If gets worse at any time, go to [facility] ER, may need IVC.

## 2022-07-18 ENCOUNTER — Encounter: Payer: Self-pay | Admitting: Physician Assistant

## 2022-07-18 ENCOUNTER — Telehealth (INDEPENDENT_AMBULATORY_CARE_PROVIDER_SITE_OTHER): Payer: Medicare Other | Admitting: Physician Assistant

## 2022-07-18 DIAGNOSIS — F3181 Bipolar II disorder: Secondary | ICD-10-CM | POA: Diagnosis not present

## 2022-07-18 DIAGNOSIS — R441 Visual hallucinations: Secondary | ICD-10-CM | POA: Diagnosis not present

## 2022-07-18 DIAGNOSIS — F22 Delusional disorders: Secondary | ICD-10-CM

## 2022-07-18 DIAGNOSIS — F411 Generalized anxiety disorder: Secondary | ICD-10-CM

## 2022-07-18 DIAGNOSIS — R4189 Other symptoms and signs involving cognitive functions and awareness: Secondary | ICD-10-CM

## 2022-07-18 NOTE — Progress Notes (Signed)
[facility]  Patient ID: Megan Duncan,  MRN: [id number]  PCP: Ann Held, DO  Date of Evaluation: 07/18/2022  Time spent:30 minutes  Chief Complaint:  Chief Complaint   Follow-up   Virtual Visit via Telehealth  I connected with patient by a video enabled telemedicine application with their informed consent, and verified patient privacy and that I am speaking with the correct person using two identifiers.  I am private, in my office and the patient is at home.  I discussed the limitations, risks, security and privacy concerns of performing an evaluation and management service by video and the availability of in person appointments. I also discussed with the patient that there may be a patient responsible charge related to this service. The patient expressed understanding and agreed to proceed.   I discussed the assessment and treatment plan with the patient. The patient was provided an opportunity to ask questions and all were answered. The patient agreed with the plan and demonstrated an understanding of the instructions.   The patient was advised to call back or seek an in-person evaluation if the symptoms worsen or if the condition fails to improve as anticipated.  I provided 30  minutes of non-face-to-face time during this encounter.  HISTORY/CURRENT STATUS: HPI For routine follow-up.  Daughter Megan Duncan, husband Megan Duncan are present.  Megan Duncan has not done well over the past few weeks.  She was last seen on 06/18/2022 when Cymbalta was increased because of decreased energy and anhedonia.  1 to 2 weeks after it was increased she started sundowning but was not aggressive or combative.  Megan Duncan decreased the dose of Cymbalta back to 80 mg on 07/07/2022.  Over the next 3 or 4 days she has started having hallucinations in the evening and was unable to sleep.  Received a phone call on 07/11/2022 reporting these symptoms and it was recommended that Seroquel be increased.   Patient's husband said she had started sleeping better so he did not increase the dose.  However on 07/16/2022 Megan Duncan called the on call service, and spoke with me concerning paranoia, more hallucinations, becoming aggressive although not physically so.  She accused her husband of kissing another woman in front of her and got very angry.  At that point I recommended we increase the Haldol to a total of 2 mg at night, and change the timing of day that some of her meds were being given but no other medication changes were made.  She has had the higher dose of Haldol for 2 nights only but has slept all through the night and has also been very sleepy during the day.  No hallucinations, paranoia, or aggression reported since increasing the dose.  Patient states that what she went through was scary.  Feels that she is catching up on sleep and does not think it is abnormal for her to sleep so much right now.  Appetite is normal.  She is not crying easily.  She is isolating.  No suicidal or homicidal thoughts.  Has a tremor of her chin.  Denies dizziness, syncope, seizures, numbness, tingling, tics, unsteady gait, slurred speech, confusion. Has chronic lumbar pain.   Individual Medical History/ Review of Systems: Changes? :No      Past medications for mental health diagnoses include: Trileptal Lamictal Prozac Zoloft Effexor XR, Cymbalta, Paxil, lithium, Depakote, Latuda, Risperdal, Tegretol, Wellbutrin, propranolol for tremor, Thorazine  Haldol and Zyprexa given for a few days inpatient January 2023, propranolol for lithium tremor  Hospitalized in January 2023 at [facility] behavioral health for altered mental status with hallucinations, paranoia, and aggressive behavior  Allergies: Atorvastatin, Codeine, Diclofenac, Doxycycline, Fluconazole, and Sulfonamide derivatives  Current Medications:  Current Outpatient Medications:    Aspirin-Caffeine (BAYER BACK & BODY) 500-32.5 MG TABS, Take 1 tablet by mouth  daily as needed. Reports takes 1-2 as needed, Disp: , Rfl:    blood glucose meter kit and supplies, Dispense based on patient and insurance preference. Use once a day.  DX Code: E11.9, Disp: 1 each, Rfl: 0   Clotrimazole 1 % LOTN, Apply bid prn, Disp: 30 mL, Rfl: 2   clotrimazole-betamethasone (LOTRISONE) cream, Apply 1 Application topically 2 (two) times daily., Disp: 30 g, Rfl: 0   donepezil (ARICEPT) 5 MG tablet, TAKE 1 TABLET BY MOUTH EVERYDAY AT BEDTIME, Disp: 90 tablet, Rfl: 1   DULoxetine (CYMBALTA) 20 MG capsule, Take 1 capsule (20 mg total) by mouth daily. Take 1 po w/ 60 mg=80 mg daily, Disp: 90 capsule, Rfl: 1   DULoxetine (CYMBALTA) 60 MG capsule, Take 1 capsule (60 mg total) by mouth daily. With 20 mg=80 mg., Disp: 90 capsule, Rfl: 1   glucose blood test strip, Use as instructed once a day.  Dx Code: E11.9, Disp: 30 each, Rfl: 2   haloperidol (HALDOL) 1 MG tablet, 1 po q am, 2 po qhs., Disp: 90 tablet, Rfl: 5   Lancets MISC, Use as directed once a day.  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 Remove & Discard patch within 12 hours or as directed by MD, Disp: 30 patch, Rfl: 0   LORazepam (ATIVAN) 1 MG tablet, Take 1/2 tab daily as needed and 1 tab at bedtime., Disp: 45 tablet, Rfl: 5   metFORMIN (GLUCOPHAGE) 1000 MG tablet, TAKE 1 TABLET (1,000 MG TOTAL) BY MOUTH TWICE A DAY WITH FOOD, Disp: 180 tablet, Rfl: 1   prazosin (MINIPRESS) 2 MG capsule, Take 1 capsule (2 mg total) by mouth at bedtime., Disp: 90 capsule, Rfl: 1   QUEtiapine (SEROQUEL) 200 MG tablet, Take 1 tablet (200 mg total) by mouth at bedtime., Disp: 90 tablet, Rfl: 1   hydrocortisone-pramoxine (ANALPRAM HC) 2.5-1 % rectal cream, Place 1 application rectally 3 (three) times daily. (Patient not taking: Reported on 01/25/2022), Disp: 30 g,  Rfl: 3 Medication Side Effects: none  Family Medical/ Social History: Changes? No  MENTAL HEALTH EXAM:  There were no vitals taken for this visit.There is no height or weight on file to calculate BMI.  General Appearance:  She is sitting in bed in her night clothes  Eye Contact:  Good  Speech:  Clear and Coherent and Normal Rate  Volume:  Normal  Mood:  Euthymic  Affect:  Anxious  Thought Process:  Goal Directed and Descriptions of Associations: Circumstantial  Orientation:  Full (Time, Place, and Person)  Thought Content: Hallucinations: Visual and Paranoid Ideation   Suicidal Thoughts:  No  Homicidal Thoughts:  No  Memory:  Immediate;   Poor Recent;   Poor Remote;   Good  Judgement:  Good  Insight:  Good  Psychomotor Activity:   Tremor of her chin    Concentration:  Concentration: Good and Attention Span: Good  Recall:  Good  Fund of Knowledge: Good  Language: Good  Assets:  Desire for Improvement  ADL's:  Intact  Cognition: WNL  Prognosis:  Good   DIAGNOSES:    ICD-10-CM   1. Bipolar II disorder ([place])  F31.81     2. Visual hallucinations  R44.1     3. Paranoia ([place])  F22     4. Disturbed cognition  R41.89     5. Generalized anxiety disorder  F41.1       Receiving Psychotherapy: No   RECOMMENDATIONS:  PDMP reviewed.  Ativan filled 07/06/2022.   I provided 30 minutes of non-face-to-face time during this encounter, including time spent before and after the visit in records review, medical decision making, counseling pertinent to today's visit, and charting.   She is doing better with the increase of Haldol.  We agreed to have her continue the same dose.  Discussed options if she does have more paranoia, hallucinations and become aggressive.  If this occurs during the night they can give her 0.5 mg of Haldol on top of her 2 mg at night dose.  If she is more anxious, it is okay to take 0.5 mg of Ativan as well.  Call with any questions or problems, there is always  someone on call after hours.  At least there will be a plan in place if needed.  Continue Aricept 5 mg, 1 p.o. nightly at 8 PM. Continue Cymbalta 80 mg daily. Continue Haldol 1 mg, 1 p.o. q am, and 2 po qhs. (ok to take an extra 1/2 pill in the middle of the night if she becomes really agitated.  Let me know the next day if they have to do that.) Continue Ativan 1 mg, 1 p.o. nightly but also can take 1/2-1 po during day prn.  (And an extra 1/2 pill during the night if needed.) Continue prazosin 2 mg p.o. nightly at 8 PM. Continue Seroquel 200 mg, 1 p.o. nightly at 8 PM. Return in 4 weeks.  Donnal Moat, PA-C

## 2022-07-26 ENCOUNTER — Ambulatory Visit: Payer: Medicare Other | Admitting: Family

## 2022-07-27 ENCOUNTER — Encounter: Payer: Self-pay | Admitting: Family Medicine

## 2022-07-27 ENCOUNTER — Ambulatory Visit (INDEPENDENT_AMBULATORY_CARE_PROVIDER_SITE_OTHER): Payer: Medicare Other | Admitting: Family Medicine

## 2022-07-27 ENCOUNTER — Ambulatory Visit: Payer: Medicare Other | Admitting: Family Medicine

## 2022-07-27 VITALS — BP 126/60 | HR 88 | Temp 98.5°F | Resp 18 | Ht 62.0 in | Wt 169.8 lb

## 2022-07-27 DIAGNOSIS — M545 Low back pain, unspecified: Secondary | ICD-10-CM | POA: Diagnosis not present

## 2022-07-27 DIAGNOSIS — Z23 Encounter for immunization: Secondary | ICD-10-CM

## 2022-07-27 DIAGNOSIS — R82998 Other abnormal findings in urine: Secondary | ICD-10-CM

## 2022-07-27 DIAGNOSIS — R110 Nausea: Secondary | ICD-10-CM

## 2022-07-27 DIAGNOSIS — B354 Tinea corporis: Secondary | ICD-10-CM | POA: Diagnosis not present

## 2022-07-27 LAB — AMYLASE: Amylase: 71 U/L (ref 27–131)

## 2022-07-27 LAB — COMPREHENSIVE METABOLIC PANEL
ALT: 14 U/L (ref 0–35)
AST: 18 U/L (ref 0–37)
Albumin: 3.9 g/dL (ref 3.5–5.2)
Alkaline Phosphatase: 52 U/L (ref 39–117)
BUN: 12 mg/dL (ref 6–23)
CO2: 28 mEq/L (ref 19–32)
Calcium: 9.6 mg/dL (ref 8.4–10.5)
Chloride: 106 mEq/L (ref 96–112)
Creatinine, Ser: 1.07 mg/dL (ref 0.40–1.20)
GFR: 50.56 mL/min — ABNORMAL LOW (ref >60.00)
Glucose, Bld: 84 mg/dL (ref 70–99)
Potassium: 3.8 mEq/L (ref 3.5–5.1)
Sodium: 144 mEq/L (ref 135–145)
Total Bilirubin: 0.3 mg/dL (ref 0.2–1.2)
Total Protein: 6.4 g/dL (ref 6.0–8.3)

## 2022-07-27 LAB — CBC WITH DIFFERENTIAL/PLATELET
Basophils Absolute: 0.1 10*3/uL (ref 0.0–0.1)
Basophils Relative: 1 % (ref 0.0–3.0)
Eosinophils Absolute: 0.2 10*3/uL (ref 0.0–0.7)
Eosinophils Relative: 2.8 % (ref 0.0–5.0)
HCT: 42.5 % (ref 36.0–46.0)
Hemoglobin: 13.8 g/dL (ref 12.0–15.0)
Lymphocytes Relative: 34.3 % (ref 12.0–46.0)
Lymphs Abs: 2.2 10*3/uL (ref 0.7–4.0)
MCHC: 32.5 g/dL (ref 30.0–36.0)
MCV: 94.2 fl (ref 78.0–100.0)
Monocytes Absolute: 0.5 10*3/uL (ref 0.1–1.0)
Monocytes Relative: 8.4 % (ref 3.0–12.0)
Neutro Abs: 3.4 10*3/uL (ref 1.4–7.7)
Neutrophils Relative %: 53.5 % (ref 43.0–77.0)
Platelets: 274 10*3/uL (ref 150.0–400.0)
RBC: 4.51 Mil/uL (ref 3.87–5.11)
RDW: 14.5 % (ref 11.5–15.5)
WBC: 6.3 10*3/uL (ref 4.0–10.5)

## 2022-07-27 LAB — POC URINALSYSI DIPSTICK (AUTOMATED)
Blood, UA: NEGATIVE
Glucose, UA: NEGATIVE
Nitrite, UA: NEGATIVE
Protein, UA: POSITIVE — AB
Spec Grav, UA: 1.025 (ref 1.010–1.025)
pH, UA: 6 (ref 5.0–8.0)

## 2022-07-27 LAB — LIPASE: Lipase: 61 U/L — ABNORMAL HIGH (ref 11.0–59.0)

## 2022-07-27 LAB — H. PYLORI ANTIBODY, IGG: H Pylori IgG: NEGATIVE

## 2022-07-27 MED ORDER — TRIAMCINOLONE ACETONIDE 0.1 % EX CREA: 1.0000 | TOPICAL_CREAM | Freq: Two times a day (BID) | CUTANEOUS | 0 refills | Status: DC

## 2022-07-27 MED ORDER — ONDANSETRON HCL 4 MG PO TABS: 4.0000 mg | ORAL_TABLET | Freq: Three times a day (TID) | ORAL | 0 refills | Status: DC | PRN

## 2022-07-27 MED ORDER — FAMOTIDINE 20 MG PO TABS: 20.0000 mg | ORAL_TABLET | Freq: Two times a day (BID) | ORAL | 2 refills | Status: DC

## 2022-07-27 NOTE — Patient Instructions (Signed)
Nausea, Adult Nausea is the feeling of having an upset stomach or that you are about to vomit. Nausea on its own is not usually a serious concern, but it may be an early sign of a more serious medical problem. As nausea gets worse, it can lead to vomiting. If vomiting develops, or if you are not able to drink enough fluids, you are at risk of becoming dehydrated. Dehydration can make you tired and thirsty, cause you to have a dry mouth, and decrease how often you urinate. Older adults and people with other diseases or a weak disease-fighting system (immune system) are at higher risk for dehydration. The main goals of treating your nausea are: To relieve your nausea. To limit repeated nausea episodes. To prevent vomiting and dehydration. Follow these instructions at home: Watch your symptoms for any changes. Tell your health care provider about them. Eating and drinking     Take an oral rehydration solution (ORS). This is a drink that is sold at pharmacies and retail stores. Drink clear fluids slowly and in small amounts as you are able. Clear fluids include water, ice chips, low-calorie sports drinks, and fruit juice that has water added (diluted fruit juice). Eat bland, easy-to-digest foods in small amounts as you are able. These foods include bananas, applesauce, rice, lean meats, toast, and crackers. Avoid drinking fluids that contain a lot of sugar or caffeine, such as energy drinks, sports drinks, and soda. Avoid alcohol. Avoid spicy or fatty foods. General instructions Take over-the-counter and prescription medicines only as told by your health care provider. Rest at home while you recover. Drink enough fluid to keep your urine pale yellow. Breathe slowly and deeply when you feel nauseous. Avoid smelling things that have strong odors. Wash your hands often using soap and water for at least 20 seconds. If soap and water are not available, use hand sanitizer. Make sure that everyone in  your household washes their hands well and often. Keep all follow-up visits. This is important. Contact a health care provider if: Your nausea gets worse. Your nausea does not go away after two days. You vomit multiple times. You cannot drink fluids without vomiting. You have any of the following: New symptoms. A fever. A headache. Muscle cramps. A rash. Pain while urinating. You feel light-headed or dizzy. Get help right away if: You have pain in your chest, neck, arm, or jaw. You feel extremely weak or you faint. You have vomit that is bright red or looks like coffee grounds. You have bloody or black stools (feces) or stools that look like tar. You have a severe headache, a stiff neck, or both. You have severe pain, cramping, or bloating in your abdomen. You have difficulty breathing or are breathing very quickly. Your heart is beating very quickly. Your skin feels cold and clammy. You feel confused. You have signs of dehydration, such as: Dark urine, very little urine, or no urine. Cracked lips. Dry mouth. Sunken eyes. Sleepiness. Weakness. These symptoms may be an emergency. Get help right away. Call 911. Do not wait to see if the symptoms will go away. Do not drive yourself to the hospital. Summary Nausea is the feeling that you have an upset stomach or that you are about to vomit. Nausea on its own is not usually a serious concern, but it may be an early sign of a more serious medical problem. If vomiting develops, or if you are not able to drink enough fluids, you are at risk of becoming   dehydrated. Follow recommendations for eating and drinking and take over-the-counter and prescription medicines only as told by your health care provider. Contact a health care provider right away if your symptoms worsen or you have new symptoms. Keep all follow-up visits. This is important. This information is not intended to replace advice given to you by your health care provider.  Make sure you discuss any questions you have with your health care provider. Document Revised: 05/05/2021 Document Reviewed: 05/05/2021 Elsevier Patient Education  2023 [employer]  

## 2022-07-28 LAB — URINE CULTURE
MICRO NUMBER:: 13923862
SPECIMEN QUALITY:: ADEQUATE

## 2022-07-28 NOTE — Assessment & Plan Note (Signed)
Con' fungal cream and add triamcinolone

## 2022-07-28 NOTE — Progress Notes (Signed)
Established Patient Office Visit  Subjective   Patient ID: Megan Duncan, female    DOB: 18-Jun-1946  Age: 76 y.o. MRN: [id number]  Chief Complaint  Patient presents with   Back Pain    X2 weeks, lower back, no urinary freq or burning   GI Problem    Pt states some nausea no vomiting.     HPI Pt is here c/o back pain with no radiation and she c/o nausea, no vomiting ,  no fever Patient Active Problem List   Diagnosis Date Noted   Acute vaginitis 06/23/2022   Rash 04/29/2022   Altered mental status 12/03/2021   Anxiety 11/28/2021   Memory loss 10/26/2021   Bilateral hearing loss 10/26/2021   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   Degenerative lumbar spinal stenosis    Anterolisthesis of lumbar spine    Lump in upper inner quadrant of left breast 07/13/2021   Left flank pain 07/13/2021   Lumbar radiculopathy 01/26/2021   Body mass index (BMI) 36.0-36.9, adult 01/11/2021   Degeneration of lumbar intervertebral disc 08/22/2020   Retrolisthesis of vertebrae 08/22/2020   Foraminal stenosis of lumbar region 08/22/2020   Generalized anxiety disorder 09/01/2018   Type II diabetes mellitus 06/05/2018   Tinea corporis 09/01/2017   Left upper quadrant pain 09/01/2017   Estrogen deficiency 09/01/2017   Thoracic aortic atherosclerosis 05/14/2017   COPD (chronic obstructive pulmonary disease) 10/30/2016   Allergic rhinitis 10/03/2016   Palpitations 07/20/2016   DOE (dyspnea on exertion) 07/20/2016   External hemorrhoid 08/01/2015   Stress incontinence, female 08/01/2015   Dermatitis 06/02/2015   Shingles outbreak 04/14/2015   Hyperlipidemia associated with type 2 diabetes mellitus ([place])    Fibromyalgia 10/14/2014   Bilateral hip pain 09/02/2014   Deformity of finger 07/15/2014   Ceruminosis 03/04/2014   Tinnitus 03/04/2014   Torticollis 03/04/2014   Bipolar 2 disorder 12/03/2013   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-sided sciatica  08/06/2012   Lower abdominal pain 08/06/2012   Otitis media 09/05/2010   Peripheral neuropathy 02/22/2010   Lumbar back pain with radiculopathy affecting left lower extremity 01/24/2010   Cervicalgia 06/20/2007   Chronic low back pain 06/20/2007   Essential (primary) hypertension 04/23/2007   Inflammatory disease of uterus 04/23/2007   Past Medical History:  Diagnosis Date   Acute pain of right shoulder 09/24/2018   Acute upper respiratory infection 09/19/2010   Allergic rhinitis 10/03/2016   Anterolisthesis of lumbar spine    Arthritis    hands   Bilateral hip pain 09/02/2014   Bipolar II disorder    Cellulitis and abscess of trunk 09/15/2009   Ceruminosis 03/04/2014   Cervicalgia 06/20/2007   Chronic low back pain 06/20/2007   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-sided sciatica 08/06/2012   COPD (chronic obstructive pulmonary disease) ([place])    Deformity of finger 07/15/2014   Degeneration of lumbar intervertebral disc 08/22/2020   Degenerative lumbar spinal stenosis    Dermatitis 94/70/9628   Diastolic dysfunction    Per pt, diagnosed after [facility]   Diverticulosis    DOE (dyspnea on exertion) 07/20/2016   Essential (primary) hypertension 04/23/2007   Estrogen deficiency 09/01/2017   External hemorrhoid 08/01/2015   Fibromyalgia    Foraminal stenosis of lumbar region 08/22/2020   Generalized anxiety disorder 09/01/2018   Hyperlipidemia    Inflammatory disease of uterus 04/23/2007   Left flank pain 07/13/2021   Left upper quadrant pain 09/01/2017   Lower abdominal pain 08/06/2012  Lumbar back pain with radiculopathy affecting left lower extremity 01/24/2010   Lumbar radiculopathy 01/26/2021   Lump in upper inner quadrant of left breast 07/13/2021   Major depressive disorder    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   Otitis media 09/05/2010   Palpitations 07/20/2016   Peripheral neuropathy 02/22/2010   Retrolisthesis of vertebrae  08/22/2020   Shingles outbreak 04/14/2015   Sinusitis, acute maxillary 09/14/2013   Stress incontinence, female 08/01/2015   Thoracic aortic atherosclerosis 05/14/2017   Tinea corporis 09/01/2017   Tinnitus 03/04/2014   Torticollis 03/04/2014   Type II diabetes mellitus 06/05/2018   Past Surgical History:  Procedure Laterality Date   APPENDECTOMY     EYE SURGERY     KNEE ARTHROSCOPY Right 11/12/2006   TONSILLECTOMY     TOTAL ABDOMINAL HYSTERECTOMY     Social History   Tobacco Use   Smoking status: Former    Packs/day: 1.50    Years: 40.00    Total pack years: 60.00    Types: Cigarettes    Quit date: 07/02/2004    Years since quitting: 18.0    Passive exposure: Past   Smokeless tobacco: Never   Tobacco comments:    Verified by Aldona Lento (Husband), & Lauralee Evener (Daughter)  Vaping Use   Vaping Use: Never used  Substance Use Topics   Alcohol use: Yes    Alcohol/week: 0.0 - 1.0 standard drinks of alcohol    Comment: occasional wine   Drug use: No   Social History   Socioeconomic History   Marital status: Married    Spouse name: Kaylon Laroche   Number of children: 3   Years of education: 14   Highest education level: Some college, no degree  Occupational History   Occupation: retired  Tobacco Use   Smoking status: Former    Packs/day: 1.50    Years: 40.00    Total pack years: 60.00    Types: Cigarettes    Quit date: 07/02/2004    Years since quitting: 18.0    Passive exposure: Past   Smokeless tobacco: Never   Tobacco comments:    Verified by Aldona Lento (Husband), & Lauralee Evener (Daughter)  Vaping Use   Vaping Use: Never used  Substance and Sexual Activity   Alcohol use: Yes    Alcohol/week: 0.0 - 1.0 standard drinks of alcohol    Comment: occasional wine   Drug use: No   Sexual activity: Never  Other Topics Concern   Not on file  Social History Narrative   Lives with husband and grandson she is raising.   11/07/21 Lives with husband    Social Determinants of Health   Financial Resource Strain: Medium Risk (11/29/2021)   Overall Financial Resource Strain (CARDIA)    Difficulty of Paying Living Expenses: Somewhat hard  Food Insecurity: No Food Insecurity (11/29/2021)   Hunger Vital Sign    Worried About Running Out of Food in the Last Year: Never true    [place] in the Last Year: Never true  Transportation Needs: No Transportation Needs (11/29/2021)   PRAPARE - Hydrologist (Medical): No    Lack of Transportation (Non-Medical): No  Physical Activity: Inactive (11/29/2021)   Exercise Vital Sign    Days of Exercise per Week: 0 days    Minutes of Exercise per Session: 0 min  Stress: No Stress Concern Present (11/29/2021)   [place]  Feeling of Stress : Not at all  Social Connections: Socially Integrated (11/29/2021)   Social Connection and Isolation Panel [NHANES]    Frequency of Communication with Friends and Family: More than three times a week    Frequency of Social Gatherings with Friends and Family: More than three times a week    Attends Religious Services: 1 to 4 times per year    Active Member of [employer] or Organizations: Yes    Attends Music therapist: More than 4 times per year    Marital Status: Married  Human resources officer Violence: Not At Risk (11/29/2021)   Humiliation, Afraid, Rape, and Kick questionnaire    Fear of Current or Ex-Partner: No    Emotionally Abused: No    Physically Abused: No    Sexually Abused: No   Family Status  Relation Name Status   Mother  Deceased   Father  Deceased   Sister  Deceased   Mat Aunt  Deceased   Mat Aunt  (Not Specified)   Field seismologist  (Not Specified)   Family History  Problem Relation Age of Onset   Hypertension Mother    Hiatal hernia Mother    Diabetes Father    Hypertension Father    Heart attack Father    Alcoholism Father    Bipolar  disorder Father    Mental illness Sister    Suicidality Sister    Colon cancer Maternal Aunt 80   Breast cancer Maternal Aunt    Bipolar disorder Paternal Aunt    Allergies  Allergen Reactions   Atorvastatin Other (See Comments)    Significant rise in liver tests   Codeine Nausea And Vomiting   Diclofenac Other (See Comments)    Elevated LFT's   Doxycycline     Extreme nausea   Fluconazole Other (See Comments)    Hallucinations    Sulfonamide Derivatives Swelling      Review of Systems  Constitutional:  Negative for fever and malaise/fatigue.  HENT:  Negative for congestion.   Eyes:  Negative for blurred vision.  Respiratory:  Negative for shortness of breath.   Cardiovascular:  Negative for chest pain, palpitations and leg swelling.  Gastrointestinal:  Positive for nausea. Negative for abdominal pain, blood in stool and vomiting.  Genitourinary:  Negative for dysuria and frequency.  Musculoskeletal:  Positive for back pain. Negative for falls.  Skin:  Positive for itching and rash.  Neurological:  Negative for dizziness, loss of consciousness and headaches.  Endo/Heme/Allergies:  Negative for environmental allergies.  Psychiatric/Behavioral:  Negative for depression. The patient is not nervous/anxious.       Objective:     BP 126/60 (BP Location: Left Arm, Patient Position: Sitting, Cuff Size: Normal)   Pulse 88   Temp 98.5 F (36.9 C) (Oral)   Resp 18   Ht '5\' 2"'$  (1.575 m)   Wt 169 lb 12.8 oz (77 kg)   SpO2 95%   BMI 31.06 kg/m  BP Readings from Last 3 Encounters:  07/27/22 126/60  06/08/22 118/80  04/23/22 120/90   Wt Readings from Last 3 Encounters:  07/27/22 169 lb 12.8 oz (77 kg)  04/23/22 180 lb 12.8 oz (82 kg)  01/22/22 183 lb 4.8 oz (83.1 kg)   SpO2 Readings from Last 3 Encounters:  07/27/22 95%  06/08/22 95%  04/23/22 94%      Physical Exam Vitals and nursing note reviewed.  Constitutional:      Appearance: She is well-developed. She is  not diaphoretic.  HENT:     Head: Normocephalic and atraumatic.     Nose: Mucosal edema present. No nasal deformity or rhinorrhea.     Right Sinus: Maxillary sinus tenderness and frontal sinus tenderness present.     Left Sinus: Maxillary sinus tenderness and frontal sinus tenderness present.     Mouth/Throat:     Pharynx: No oropharyngeal exudate.  Eyes:     Conjunctiva/sclera: Conjunctivae normal.  Neck:     Thyroid: No thyromegaly.     Vascular: No carotid bruit or JVD.  Cardiovascular:     Rate and Rhythm: Normal rate and regular rhythm.     Heart sounds: Normal heart sounds. No murmur heard. Pulmonary:     Effort: Pulmonary effort is normal. No respiratory distress.     Breath sounds: Normal breath sounds. No wheezing or rales.  Chest:     Chest wall: No tenderness.       Comments: Errythema ----c/w tinea--- improved but itchy Abdominal:     General: There is no distension.     Palpations: Abdomen is soft.     Tenderness: There is no abdominal tenderness. There is no guarding or rebound.  Musculoskeletal:     Cervical back: Normal range of motion and neck supple.  Lymphadenopathy:     Cervical: No cervical adenopathy.  Skin:    General: Skin is warm.     Findings: Erythema and rash present.  Neurological:     Mental Status: She is alert and oriented to person, place, and time.      Results for orders placed or performed in visit on 07/27/22  Urine Culture   Specimen: Urine  Result Value Ref Range   MICRO NUMBER: [id number]    SPECIMEN QUALITY: Adequate    Sample Source URINE    STATUS: FINAL    Result:      Mixed genital flora isolated. These superficial bacteria are not indicative of a urinary tract infection. No further organism identification is warranted on this specimen. If clinically indicated, recollect clean-catch, mid-stream urine and transfer  immediately to Urine Culture Transport Tube.   CBC with Differential/Platelet  Result Value Ref Range   WBC  6.3 4.0 - 10.5 K/uL   RBC 4.51 3.87 - 5.11 Mil/uL   Hemoglobin 13.8 12.0 - 15.0 g/dL   HCT 42.5 36.0 - 46.0 %   MCV 94.2 78.0 - 100.0 fl   MCHC 32.5 30.0 - 36.0 g/dL   RDW 14.5 11.5 - 15.5 %   Platelets 274.0 150.0 - 400.0 K/uL   Neutrophils Relative % 53.5 43.0 - 77.0 %   Lymphocytes Relative 34.3 12.0 - 46.0 %   Monocytes Relative 8.4 3.0 - 12.0 %   Eosinophils Relative 2.8 0.0 - 5.0 %   Basophils Relative 1.0 0.0 - 3.0 %   Neutro Abs 3.4 1.4 - 7.7 K/uL   Lymphs Abs 2.2 0.7 - 4.0 K/uL   Monocytes Absolute 0.5 0.1 - 1.0 K/uL   Eosinophils Absolute 0.2 0.0 - 0.7 K/uL   Basophils Absolute 0.1 0.0 - 0.1 K/uL  Comprehensive metabolic panel  Result Value Ref Range   Sodium 144 135 - 145 mEq/L   Potassium 3.8 3.5 - 5.1 mEq/L   Chloride 106 96 - 112 mEq/L   CO2 28 19 - 32 mEq/L   Glucose, Bld 84 70 - 99 mg/dL   BUN 12 6 - 23 mg/dL   Creatinine, Ser 1.07 0.40 - 1.20 mg/dL   Total Bilirubin 0.3  0.2 - 1.2 mg/dL   Alkaline Phosphatase 52 39 - 117 U/L   AST 18 0 - 37 U/L   ALT 14 0 - 35 U/L   Total Protein 6.4 6.0 - 8.3 g/dL   Albumin 3.9 3.5 - 5.2 g/dL   GFR 50.56 (L) >60.00 mL/min   Calcium 9.6 8.4 - 10.5 mg/dL  H. pylori antibody, IgG  Result Value Ref Range   H Pylori IgG Negative Negative  Amylase  Result Value Ref Range   Amylase 71 27 - 131 U/L  Lipase  Result Value Ref Range   Lipase 61.0 (H) 11.0 - 59.0 U/L  POCT Urinalysis Dipstick (Automated)  Result Value Ref Range   Color, UA yellow    Clarity, UA clear    Glucose, UA Negative Negative   Bilirubin, UA moderate    Ketones, UA trace    Spec Grav, UA 1.025 1.010 - 1.025   Blood, UA negative    pH, UA 6.0 5.0 - 8.0   Protein, UA Positive (A) Negative   Urobilinogen, UA     Nitrite, UA negative    Leukocytes, UA Small (1+) (A) Negative    Last CBC Lab Results  Component Value Date   WBC 6.3 07/27/2022   HGB 13.8 07/27/2022   HCT 42.5 07/27/2022   MCV 94.2 07/27/2022   MCH 31.1 12/02/2021   RDW 14.5  07/27/2022   PLT 274.0 63/14/9702   Last metabolic panel Lab Results  Component Value Date   GLUCOSE 84 07/27/2022   NA 144 07/27/2022   K 3.8 07/27/2022   CL 106 07/27/2022   CO2 28 07/27/2022   BUN 12 07/27/2022   CREATININE 1.07 07/27/2022   GFRNONAA >60 12/02/2021   CALCIUM 9.6 07/27/2022   PROT 6.4 07/27/2022   ALBUMIN 3.9 07/27/2022   BILITOT 0.3 07/27/2022   ALKPHOS 52 07/27/2022   AST 18 07/27/2022   ALT 14 07/27/2022   ANIONGAP 11 12/02/2021   Last lipids Lab Results  Component Value Date   CHOL 196 04/23/2022   HDL 71.10 04/23/2022   LDLCALC 96 04/23/2022   LDLDIRECT 120.1 12/03/2013   TRIG 143.0 04/23/2022   CHOLHDL 3 04/23/2022   Last hemoglobin A1c Lab Results  Component Value Date   HGBA1C 5.8 04/23/2022   Last thyroid functions Lab Results  Component Value Date   TSH 0.93 04/23/2022   T4TOTAL 6.7 06/02/2019   Last vitamin D No results found for: "25OHVITD2", "25OHVITD3", "VD25OH" Last vitamin B12 and Folate Lab Results  Component Value Date   VITAMINB12 1,132 (H) 10/23/2021      The 10-year ASCVD risk score (Arnett DK, et al., 2019) is: 39.6%    Assessment & Plan:   Problem List Items Addressed This Visit       Unprioritized   Tinea corporis - Primary    Con' fungal cream and add triamcinolone      Relevant Medications   triamcinolone cream (KENALOG) 0.1 %   Other Visit Diagnoses     Nausea       Relevant Medications   famotidine (PEPCID) 20 MG tablet   ondansetron (ZOFRAN) 4 MG tablet   Other Relevant Orders   CBC with Differential/Platelet (Completed)   Comprehensive metabolic panel (Completed)   H. pylori antibody, IgG (Completed)   POCT Urinalysis Dipstick (Automated) (Completed)   Amylase (Completed)   Lipase (Completed)   Acute bilateral low back pain without sciatica       Relevant Medications  famotidine (PEPCID) 20 MG tablet   Other Relevant Orders   CBC with Differential/Platelet (Completed)    Comprehensive metabolic panel (Completed)   H. pylori antibody, IgG (Completed)   POCT Urinalysis Dipstick (Automated) (Completed)   Amylase (Completed)   Lipase (Completed)   Need for influenza vaccination       Relevant Orders   Flu Vaccine QUAD High Dose(Fluad) (Completed)   Leukocytes in urine       Relevant Orders   Urine Culture (Completed)       Return if symptoms worsen or fail to improve.    Ann Held, DO

## 2022-07-31 ENCOUNTER — Emergency Department (HOSPITAL_BASED_OUTPATIENT_CLINIC_OR_DEPARTMENT_OTHER)
Admission: EM | Admit: 2022-07-31 | Discharge: 2022-07-31 | Disposition: A | Discharge status: Home / Self Care | Payer: Medicare Other | Attending: Emergency Medicine | Admitting: Emergency Medicine

## 2022-07-31 ENCOUNTER — Telehealth: Payer: Self-pay | Admitting: Physician Assistant

## 2022-07-31 ENCOUNTER — Telehealth: Payer: Self-pay

## 2022-07-31 ENCOUNTER — Encounter (HOSPITAL_BASED_OUTPATIENT_CLINIC_OR_DEPARTMENT_OTHER): Payer: Self-pay | Admitting: Emergency Medicine

## 2022-07-31 ENCOUNTER — Ambulatory Visit: Payer: Medicare Other | Admitting: Family

## 2022-07-31 ENCOUNTER — Emergency Department (HOSPITAL_BASED_OUTPATIENT_CLINIC_OR_DEPARTMENT_OTHER): Payer: Medicare Other

## 2022-07-31 DIAGNOSIS — R112 Nausea with vomiting, unspecified: Secondary | ICD-10-CM | POA: Diagnosis not present

## 2022-07-31 DIAGNOSIS — K5730 Diverticulosis of large intestine without perforation or abscess without bleeding: Secondary | ICD-10-CM | POA: Diagnosis not present

## 2022-07-31 DIAGNOSIS — R1084 Generalized abdominal pain: Secondary | ICD-10-CM

## 2022-07-31 DIAGNOSIS — K760 Fatty (change of) liver, not elsewhere classified: Secondary | ICD-10-CM | POA: Diagnosis not present

## 2022-07-31 DIAGNOSIS — R000 Tachycardia, unspecified: Secondary | ICD-10-CM | POA: Insufficient documentation

## 2022-07-31 LAB — URINALYSIS, MICROSCOPIC (REFLEX)

## 2022-07-31 LAB — URINALYSIS, ROUTINE W REFLEX MICROSCOPIC
Bilirubin Urine: NEGATIVE
Glucose, UA: NEGATIVE mg/dL
Ketones, ur: NEGATIVE mg/dL
Nitrite: NEGATIVE
Protein, ur: NEGATIVE mg/dL
Specific Gravity, Urine: <=1.005 (ref 1.005–1.030)
pH: 5.5 (ref 5.0–8.0)

## 2022-07-31 LAB — CBC
HCT: 41.1 % (ref 36.0–46.0)
Hemoglobin: 13.3 g/dL (ref 12.0–15.0)
MCH: 31 pg (ref 26.0–34.0)
MCHC: 32.4 g/dL (ref 30.0–36.0)
MCV: 95.8 fL (ref 80.0–100.0)
Platelets: 268 10*3/uL (ref 150–400)
RBC: 4.29 MIL/uL (ref 3.87–5.11)
RDW: 14.5 % (ref 11.5–15.5)
WBC: 5.6 10*3/uL (ref 4.0–10.5)
nRBC: 0 % (ref 0.0–0.2)

## 2022-07-31 LAB — COMPREHENSIVE METABOLIC PANEL
ALT: 14 U/L (ref 0–44)
AST: 25 U/L (ref 15–41)
Albumin: 3.7 g/dL (ref 3.5–5.0)
Alkaline Phosphatase: 45 U/L (ref 38–126)
Anion gap: 7 (ref 5–15)
BUN: 11 mg/dL (ref 8–23)
CO2: 27 mmol/L (ref 22–32)
Calcium: 9.1 mg/dL (ref 8.9–10.3)
Chloride: 107 mmol/L (ref 98–111)
Creatinine, Ser: 1.01 mg/dL — ABNORMAL HIGH (ref 0.44–1.00)
GFR, Estimated: 58 mL/min — ABNORMAL LOW (ref >60)
Glucose, Bld: 123 mg/dL — ABNORMAL HIGH (ref 70–99)
Potassium: 4 mmol/L (ref 3.5–5.1)
Sodium: 141 mmol/L (ref 135–145)
Total Bilirubin: 0.5 mg/dL (ref 0.3–1.2)
Total Protein: 6.5 g/dL (ref 6.5–8.1)

## 2022-07-31 LAB — LIPASE, BLOOD: Lipase: 47 U/L (ref 11–51)

## 2022-07-31 MED ORDER — METOCLOPRAMIDE HCL 5 MG/ML IJ SOLN
10.0000 mg | Freq: Once | INTRAMUSCULAR | Status: AC
  Administered 2022-07-31: 10 mg via INTRAVENOUS
  Filled 2022-07-31: qty 2

## 2022-07-31 MED ORDER — ALUM & MAG HYDROXIDE-SIMETH 200-200-20 MG/5ML PO SUSP
30.0000 mL | Freq: Once | ORAL | Status: AC
  Administered 2022-07-31: 30 mL via ORAL
  Filled 2022-07-31: qty 30

## 2022-07-31 MED ORDER — METOCLOPRAMIDE HCL 10 MG PO TABS: 10.0000 mg | ORAL_TABLET | Freq: Three times a day (TID) | ORAL | 0 refills | Status: DC | PRN

## 2022-07-31 MED ORDER — OXYCODONE HCL 5 MG PO TABS: 5.0000 mg | ORAL_TABLET | ORAL | 0 refills | Status: DC | PRN

## 2022-07-31 MED ORDER — ONDANSETRON HCL 4 MG/2ML IJ SOLN
4.0000 mg | Freq: Once | INTRAMUSCULAR | Status: AC
  Administered 2022-07-31: 4 mg via INTRAVENOUS
  Filled 2022-07-31: qty 2

## 2022-07-31 MED ORDER — FAMOTIDINE IN NACL 20-0.9 MG/50ML-% IV SOLN
20.0000 mg | Freq: Once | INTRAVENOUS | Status: AC
  Administered 2022-07-31: 20 mg via INTRAVENOUS
  Filled 2022-07-31: qty 50

## 2022-07-31 MED ORDER — FENTANYL CITRATE PF 50 MCG/ML IJ SOSY
50.0000 ug | PREFILLED_SYRINGE | Freq: Once | INTRAMUSCULAR | Status: AC
  Administered 2022-07-31: 50 ug via INTRAVENOUS
  Filled 2022-07-31: qty 1

## 2022-07-31 MED ORDER — DIPHENHYDRAMINE HCL 50 MG/ML IJ SOLN
12.5000 mg | Freq: Once | INTRAMUSCULAR | Status: AC
  Administered 2022-07-31: 12.5 mg via INTRAVENOUS
  Filled 2022-07-31: qty 1

## 2022-07-31 MED ORDER — IOHEXOL 300 MG/ML  SOLN
100.0000 mL | Freq: Once | INTRAMUSCULAR | Status: AC | PRN
  Administered 2022-07-31: 100 mL via INTRAVENOUS

## 2022-07-31 MED ORDER — OXYCODONE HCL 5 MG PO TABS
5.0000 mg | ORAL_TABLET | Freq: Once | ORAL | Status: AC
  Administered 2022-07-31: 5 mg via ORAL
  Filled 2022-07-31: qty 1

## 2022-07-31 NOTE — ED Provider Notes (Signed)
4:13 PM Care assumed from Dr. Pearline Cables.  At time of transfer of care, patient is awaiting reassessment after medications are provided for symptomatic relief.  Patient had a CT scan that was overall reassuring and urinalysis returned without convincing evidence of UTI in the absence of urinary symptoms.  Plan of care be to discharge home for outpatient follow-up as she may need outpatient EGD or other GI evaluation.  Anticipate reassessment shortly.  Patient is reporting her symptoms have slightly improved but are still present.  We discussed all the work-up results and the overall reassuring work-up otherwise.  We did discuss that this could be more of an ulcer type abdominal pain and do recommend her following with outpatient GI.  Patient agrees.  We will give her prescription for pain medicine and nausea medicine and she will follow-up with outpatient GI and PCP.  She understood return precautions and follow-up instructions and was discharged in good condition.   Clinical Impression: 1. Generalized abdominal pain   2. Nausea and vomiting, unspecified vomiting type     Disposition: Discharge  Condition: Good  I have discussed the results, Dx and Tx plan with the pt(& family if present). He/she/they expressed understanding and agree(s) with the plan. Discharge instructions discussed at great length. Strict return precautions discussed and pt &/or family have verbalized understanding of the instructions. No further questions at time of discharge.    New Prescriptions   METOCLOPRAMIDE (REGLAN) 10 MG TABLET    Take 1 tablet (10 mg total) by mouth every 8 (eight) hours as needed for nausea.   OXYCODONE (ROXICODONE) 5 MG IMMEDIATE RELEASE TABLET    Take 1 tablet (5 mg total) by mouth every 4 (four) hours as needed for severe pain.    Follow Up: Ann Held, DO [address] [place] [address] [phone]     [facility] [address] [place] [address] [phone]    Gastroenterology, [address] [place] [address] [phone]         Derrica Sieg, Gwenyth Allegra, MD 07/31/22 1744

## 2022-07-31 NOTE — Telephone Encounter (Signed)
LVM to rtc 

## 2022-07-31 NOTE — Telephone Encounter (Signed)
noted 

## 2022-07-31 NOTE — Discharge Instructions (Signed)
Your history, exam and evaluation today did not reveal any emergent abnormalities that would require admission however with your amount of symptoms we do feel is reasonable to give you prescription for stronger pain and nausea medicine to use at home.  Given the possibility of an ulcer or more gastrointestinal symptoms, we do want you to follow-up with outpatient gastroenterology so please consider calling either of the 2 GI teams in town to get seen.  Please also call your primary doctor for reassessment.  If any symptoms change or worsen acutely, please return to the nearest emergency department.

## 2022-07-31 NOTE — ED Notes (Signed)
Patient transported to CT 

## 2022-07-31 NOTE — ED Notes (Signed)
Pt unable to give urine sample at this time , will try again later. 

## 2022-07-31 NOTE — Telephone Encounter (Signed)
Please review

## 2022-07-31 NOTE — Telephone Encounter (Signed)
Megan Duncan info.She said they did do a UA and it was negative for UTI

## 2022-07-31 NOTE — Telephone Encounter (Signed)
Spoke with patient's daughter [name] she informed that Megan Duncan has increased her Haldol by '1mg'$  and she is sleeping through the night without getting up. However, for the past week Megan Duncan has been experiencing "severe" stomach pain. "Feels jittery and crampy. It usually starts during the day before taking medicine and after. Almyra Free also informed that Megan Duncan has recently went to doctor and everything was clear. She would like to know if this is due to the increase of the Haldol. Pls rtc [phone] [name] is listed on [facility]

## 2022-07-31 NOTE — ED Provider Notes (Signed)
[facility] EMERGENCY DEPARTMENT Provider Note   CSN: [id number] Arrival date & time: 07/31/22  1152     History  Chief Complaint  Patient presents with   Abdominal Pain   Nausea    Megan Duncan is a 76 y.o. female.  Patient is a 76 year old female presenting from home with her husband for abdominal pain.  Patient midst of generalized abdominal pain and nausea without vomiting x1 week.  Denies fevers or chills.  Denies diarrhea.  Denies constipation.  Able to tolerate p.o. without difficulty.  Patient was seen by primary care physician 4 days ago for same symptoms and sent home with Pepcid and Zofran with no improvement of symptoms.  Patient denies any black or bloody stools.  The history is provided by the patient. No language interpreter was used.  Abdominal Pain Associated symptoms: vomiting   Associated symptoms: no chest pain, no chills, no constipation, no cough, no diarrhea, no dysuria, no fever, no hematuria, no shortness of breath and no sore throat        Home Medications Prior to Admission medications   Medication Sig Start Date End Date Taking? Authorizing Provider  Aspirin-Caffeine (BAYER BACK & BODY) 500-32.5 MG TABS Take 1 tablet by mouth daily as needed. Reports takes 1-2 as needed    [provider]  blood glucose meter kit and supplies Dispense based on patient and insurance preference. Use once a day.  DX Code: E11.9 04/15/18   Carollee Herter, Alferd Apa, DO  Clotrimazole 1 % LOTN Apply bid prn 06/13/22   Carollee Herter, Alferd Apa, DO  clotrimazole-betamethasone (LOTRISONE) cream Apply 1 Application topically 2 (two) times daily. 06/12/22   Roma Schanz R, DO  donepezil (ARICEPT) 5 MG tablet TAKE 1 TABLET BY MOUTH EVERYDAY AT BEDTIME 07/16/22   Hurst, Helene Kelp T, PA-C  DULoxetine (CYMBALTA) 20 MG capsule Take 1 capsule (20 mg total) by mouth daily. Take 1 po w/ 60 mg=80 mg daily 07/16/22   Donnal Moat T, PA-C  DULoxetine (CYMBALTA) 60 MG capsule  Take 1 capsule (60 mg total) by mouth daily. With 20 mg=80 mg. 07/16/22   Donnal Moat T, PA-C  famotidine (PEPCID) 20 MG tablet Take 1 tablet (20 mg total) by mouth 2 (two) times daily. 07/27/22   Roma Schanz R, DO  glucose blood test strip Use as instructed once a day.  Dx Code: E11.9 06/20/20   Carollee Herter, Alferd Apa, DO  haloperidol (HALDOL) 1 MG tablet 1 po q am, 2 po qhs. 07/16/22   Hurst, Idamae Glassman, PA-C  hydrocortisone-pramoxine (ANALPRAM HC) 2.5-1 % rectal cream Place 1 application rectally 3 (three) times daily. Patient not taking: Reported on 01/25/2022 08/01/15   Midge Minium, MD  Lancets MISC Use as directed once a day.  Dx code: E11.9 01/11/20   Carollee Herter, Alferd Apa, DO  levothyroxine (SYNTHROID) 50 MCG tablet TAKE 1 TABLET BY MOUTH EVERY DAY BEFORE BREAKFAST 06/06/22   Carollee Herter, Yvonne R, DO  lidocaine (LIDODERM) 5 % Place 1 patch onto the skin daily as needed. Remove & Discard patch within 12 hours or as directed by MD 12/22/21   Parks Ranger, DO  LORazepam (ATIVAN) 1 MG tablet Take 1/2 tab daily as needed and 1 tab at bedtime. 07/16/22   Donnal Moat T, PA-C  metFORMIN (GLUCOPHAGE) 1000 MG tablet TAKE 1 TABLET (1,000 MG TOTAL) BY MOUTH TWICE A DAY WITH FOOD 06/13/22   Carollee Herter, Kendrick Fries R, DO  ondansetron (  ZOFRAN) 4 MG tablet Take 1 tablet (4 mg total) by mouth every 8 (eight) hours as needed for nausea or vomiting. 07/27/22   Carollee Herter, Alferd Apa, DO  prazosin (MINIPRESS) 2 MG capsule Take 1 capsule (2 mg total) by mouth at bedtime. 07/16/22   Donnal Moat T, PA-C  QUEtiapine (SEROQUEL) 200 MG tablet Take 1 tablet (200 mg total) by mouth at bedtime. 07/16/22   Donnal Moat T, PA-C  triamcinolone cream (KENALOG) 0.1 % Apply 1 Application topically 2 (two) times daily. 07/27/22   Ann Held, DO      Allergies    Atorvastatin, Codeine, Diclofenac, Doxycycline, Fluconazole, and Sulfonamide derivatives    Review of Systems   Review of Systems  Constitutional:   Negative for chills and fever.  HENT:  Negative for ear pain and sore throat.   Eyes:  Negative for pain and visual disturbance.  Respiratory:  Negative for cough and shortness of breath.   Cardiovascular:  Negative for chest pain and palpitations.  Gastrointestinal:  Positive for abdominal pain and vomiting. Negative for blood in stool, constipation and diarrhea.  Genitourinary:  Negative for dysuria and hematuria.  Musculoskeletal:  Negative for arthralgias and back pain.  Skin:  Negative for color change and rash.  Neurological:  Negative for seizures and syncope.  All other systems reviewed and are negative.   Physical Exam Updated Vital Signs BP (!) 147/125   Pulse 91   Temp 98.1 F (36.7 C) (Oral)   Resp 17   Ht '5\' 2"'  (1.575 m)   Wt 76.7 kg Comment: per record  SpO2 96%   BMI 30.91 kg/m  Physical Exam Vitals and nursing note reviewed.  Constitutional:      General: She is not in acute distress.    Appearance: She is well-developed.  HENT:     Head: Normocephalic and atraumatic.  Eyes:     Conjunctiva/sclera: Conjunctivae normal.  Cardiovascular:     Rate and Rhythm: Normal rate and regular rhythm.     Heart sounds: No murmur heard. Pulmonary:     Effort: Pulmonary effort is normal. No respiratory distress.     Breath sounds: Normal breath sounds.  Abdominal:     Palpations: Abdomen is soft.     Tenderness: There is generalized abdominal tenderness. There is no guarding or rebound.  Musculoskeletal:        General: No swelling.     Cervical back: Neck supple.  Skin:    General: Skin is warm and dry.     Capillary Refill: Capillary refill takes less than 2 seconds.  Neurological:     Mental Status: She is alert.  Psychiatric:        Mood and Affect: Mood normal.     ED Results / Procedures / Treatments   Labs (all labs ordered are listed, but only abnormal results are displayed) Labs Reviewed  COMPREHENSIVE METABOLIC PANEL - Abnormal; Notable for the  following components:      Result Value   Glucose, Bld 123 (*)    Creatinine, Ser 1.01 (*)    GFR, Estimated 58 (*)    All other components within normal limits  LIPASE, BLOOD  CBC  URINALYSIS, ROUTINE W REFLEX MICROSCOPIC    EKG EKG Interpretation  Date/Time:  Tuesday July 31 2022 12:29:46 EDT Ventricular Rate:  102 PR Interval:  136 QRS Duration: 64 QT Interval:  372 QTC Calculation: 484 R Axis:   -58 Text Interpretation: Sinus tachycardia Left axis  deviation Low voltage QRS Confirmed by Campbell Stall ([id number]) on 0/96/0454 1:16:26 PM  Radiology CT ABDOMEN PELVIS W CONTRAST  Result Date: 07/31/2022 CLINICAL DATA:  Abdominal pain, nausea EXAM: CT ABDOMEN AND PELVIS WITH CONTRAST TECHNIQUE: Multidetector CT imaging of the abdomen and pelvis was performed using the standard protocol following bolus administration of intravenous contrast. RADIATION DOSE REDUCTION: This exam was performed according to the departmental dose-optimization program which includes automated exposure control, adjustment of the mA and/or kV according to patient size and/or use of iterative reconstruction technique. CONTRAST:  14m OMNIPAQUE IOHEXOL 300 MG/ML  SOLN COMPARISON:  12/01/2021 FINDINGS: Lower chest: Visualized lower lung fields are clear. Hepatobiliary: There is fatty infiltration in liver. There is no dilation of bile ducts. Gallbladder is not distended. Pancreas: No focal abnormalities are seen. Spleen: Unremarkable. Adrenals/Urinary Tract: There is mild nodularity in left adrenal with no significant change. There is no hydronephrosis. 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 There are few smooth marginated low-density lesions in the kidneys largest measuring 3.8 cm in size in the right kidney. No interval changes are noted. There are no renal or ureteral stones. Urinary bladder is not distended. Stomach/Bowel: Stomach is unremarkable. Small bowel loops are not dilated. Appendix is not seen. There is no  pericecal inflammation. There is no significant wall thickening in colon. Multiple diverticula are seen in colon without signs of focal acute diverticulitis. Vascular/Lymphatic: Scattered arterial calcifications are seen. No significant lymphadenopathy is seen. Reproductive: Uterus is not seen. Other: There is no ascites or pneumoperitoneum. Musculoskeletal: Degenerative changes are noted in lumbar spine, more severe at L2-L3 level. IMPRESSION: There is no evidence of intestinal obstruction or pneumoperitoneum. There is no hydronephrosis. Fatty liver. Diverticulosis of colon without signs of focal diverticulitis. Renal cysts. Other findings as described in the body of the report. Electronically Signed   By: PElmer PickerM.D.   On: 07/31/2022 14:40    Procedures Procedures    Medications Ordered in ED Medications  metoCLOPramide (REGLAN) injection 10 mg (has no administration in time range)  diphenhydrAMINE (BENADRYL) injection 12.5 mg (has no administration in time range)  famotidine (PEPCID) IVPB 20 mg premix (has no administration in time range)  ondansetron (ZOFRAN) injection 4 mg (4 mg Intravenous Given 07/31/22 1344)  fentaNYL (SUBLIMAZE) injection 50 mcg (50 mcg Intravenous Given 07/31/22 1344)  oxyCODONE (Oxy IR/ROXICODONE) immediate release tablet 5 mg (5 mg Oral Given 07/31/22 1344)  iohexol (OMNIPAQUE) 300 MG/ML solution 100 mL (100 mLs Intravenous Contrast Given 07/31/22 1406)  alum & mag hydroxide-simeth (MAALOX/MYLANTA) 200-200-20 MG/5ML suspension 30 mL (30 mLs Oral Given 07/31/22 1555)    ED Course/ Medical Decision Making/ A&P                           Medical Decision Making Amount and/or Complexity of Data Reviewed Labs: ordered. Radiology: ordered.  Risk OTC drugs. Prescription drug management.   [id number] 76year old female presenting from home with her husband for abdominal pain.  Is alert and oriented x3, no acute distress, afebrile, stable to signs.  Abdomen is  soft without distention.  No guarding or rigidity.  Generalized tenderness on exam.  Otherwise no significant findings.  Laboratory studies stable liver profile, lipase, and renal function.  No signs or symptoms of sepsis.  CT abdomen and pelvis demonstrates no acute process.  Continues to complain of severe generalized abdominal pain despite fentanyl and oxycodone.  Patient given GI cocktail including Reglan, Benadryl, Carafate,  and Pepcid.  Urine still pending.  Patient signed out to oncoming provider while awaiting results.        Final Clinical Impression(s) / ED Diagnoses Final diagnoses:  Generalized abdominal pain    Rx / DC Orders ED Discharge Orders     None         Lianne Cure, DO 83/09/40 1556

## 2022-07-31 NOTE — Telephone Encounter (Signed)
[facility] - Client Client Site [facility] - Night Provider Jodi Mourning- NP Contact Type Call Who Is Calling Patient / Member / Family / Caregiver Caller Name Haelie Clapp Phone Number [phone] Patient Name Megan Duncan Patient DOB 02/11/1946 Call Type Message Only Information Provided Reason for Call Request to [place] Appointment Initial Comment Caller States he needs to cancel his wife's appointment. Patient request to speak to RN No Additional Comment Office hour provided.

## 2022-07-31 NOTE — Telephone Encounter (Signed)
It's hard to say if associated with the increase or not. Have her decrease the evening Haldol dose from 2 mg to 1.5 mg.  I know that she has just been checked, but did she have a urinalysis, looking for a UTI?  If not, she really needs to be.  UTIs can cause abdominal pain and also confusion in the elderly.

## 2022-07-31 NOTE — ED Triage Notes (Addendum)
Pt to ED c/o nausea and abdominal pain x 1 week . Tachycardic in triage . Recently evaluated for the same complaint.

## 2022-08-01 ENCOUNTER — Encounter (HOSPITAL_COMMUNITY): Payer: Self-pay

## 2022-08-01 ENCOUNTER — Emergency Department (HOSPITAL_COMMUNITY)
Admission: EM | Admit: 2022-08-01 | Discharge: 2022-08-01 | Disposition: A | Discharge status: Home / Self Care | Payer: Medicare Other | Attending: Emergency Medicine | Admitting: Emergency Medicine

## 2022-08-01 ENCOUNTER — Telehealth: Payer: Self-pay | Admitting: Family Medicine

## 2022-08-01 ENCOUNTER — Other Ambulatory Visit: Payer: Self-pay | Admitting: Physician Assistant

## 2022-08-01 DIAGNOSIS — Z79899 Other long term (current) drug therapy: Secondary | ICD-10-CM | POA: Diagnosis not present

## 2022-08-01 DIAGNOSIS — J449 Chronic obstructive pulmonary disease, unspecified: Secondary | ICD-10-CM | POA: Insufficient documentation

## 2022-08-01 DIAGNOSIS — R112 Nausea with vomiting, unspecified: Secondary | ICD-10-CM | POA: Diagnosis not present

## 2022-08-01 DIAGNOSIS — I10 Essential (primary) hypertension: Secondary | ICD-10-CM | POA: Diagnosis not present

## 2022-08-01 DIAGNOSIS — R000 Tachycardia, unspecified: Secondary | ICD-10-CM | POA: Diagnosis not present

## 2022-08-01 DIAGNOSIS — Z7984 Long term (current) use of oral hypoglycemic drugs: Secondary | ICD-10-CM | POA: Insufficient documentation

## 2022-08-01 DIAGNOSIS — R110 Nausea: Secondary | ICD-10-CM | POA: Diagnosis not present

## 2022-08-01 DIAGNOSIS — E119 Type 2 diabetes mellitus without complications: Secondary | ICD-10-CM | POA: Insufficient documentation

## 2022-08-01 DIAGNOSIS — K5730 Diverticulosis of large intestine without perforation or abscess without bleeding: Secondary | ICD-10-CM | POA: Diagnosis not present

## 2022-08-01 DIAGNOSIS — R1084 Generalized abdominal pain: Secondary | ICD-10-CM

## 2022-08-01 DIAGNOSIS — K760 Fatty (change of) liver, not elsewhere classified: Secondary | ICD-10-CM | POA: Diagnosis not present

## 2022-08-01 LAB — URINALYSIS, ROUTINE W REFLEX MICROSCOPIC
Bacteria, UA: NONE SEEN
Bilirubin Urine: NEGATIVE
Glucose, UA: NEGATIVE mg/dL
Hgb urine dipstick: NEGATIVE
Ketones, ur: NEGATIVE mg/dL
Nitrite: NEGATIVE
Protein, ur: NEGATIVE mg/dL
Specific Gravity, Urine: 1.018 (ref 1.005–1.030)
pH: 5 (ref 5.0–8.0)

## 2022-08-01 LAB — CBC WITH DIFFERENTIAL/PLATELET
Abs Immature Granulocytes: 0.02 10*3/uL (ref 0.00–0.07)
Basophils Absolute: 0.1 10*3/uL (ref 0.0–0.1)
Basophils Relative: 1 %
Eosinophils Absolute: 0.2 10*3/uL (ref 0.0–0.5)
Eosinophils Relative: 3 %
HCT: 43.6 % (ref 36.0–46.0)
Hemoglobin: 13.9 g/dL (ref 12.0–15.0)
Immature Granulocytes: 0 %
Lymphocytes Relative: 28 %
Lymphs Abs: 1.9 10*3/uL (ref 0.7–4.0)
MCH: 31 pg (ref 26.0–34.0)
MCHC: 31.9 g/dL (ref 30.0–36.0)
MCV: 97.1 fL (ref 80.0–100.0)
Monocytes Absolute: 0.7 10*3/uL (ref 0.1–1.0)
Monocytes Relative: 10 %
Neutro Abs: 4 10*3/uL (ref 1.7–7.7)
Neutrophils Relative %: 58 %
Platelets: 299 10*3/uL (ref 150–400)
RBC: 4.49 MIL/uL (ref 3.87–5.11)
RDW: 14.3 % (ref 11.5–15.5)
WBC: 6.8 10*3/uL (ref 4.0–10.5)
nRBC: 0 % (ref 0.0–0.2)

## 2022-08-01 LAB — COMPREHENSIVE METABOLIC PANEL
ALT: 15 U/L (ref 0–44)
AST: 23 U/L (ref 15–41)
Albumin: 4.2 g/dL (ref 3.5–5.0)
Alkaline Phosphatase: 49 U/L (ref 38–126)
Anion gap: 10 (ref 5–15)
BUN: 11 mg/dL (ref 8–23)
CO2: 25 mmol/L (ref 22–32)
Calcium: 9.9 mg/dL (ref 8.9–10.3)
Chloride: 112 mmol/L — ABNORMAL HIGH (ref 98–111)
Creatinine, Ser: 1.06 mg/dL — ABNORMAL HIGH (ref 0.44–1.00)
GFR, Estimated: 54 mL/min — ABNORMAL LOW (ref >60)
Glucose, Bld: 122 mg/dL — ABNORMAL HIGH (ref 70–99)
Potassium: 4 mmol/L (ref 3.5–5.1)
Sodium: 147 mmol/L — ABNORMAL HIGH (ref 135–145)
Total Bilirubin: 0.4 mg/dL (ref 0.3–1.2)
Total Protein: 7.1 g/dL (ref 6.5–8.1)

## 2022-08-01 LAB — LIPASE, BLOOD: Lipase: 35 U/L (ref 11–51)

## 2022-08-01 MED ORDER — DICYCLOMINE HCL 10 MG PO CAPS
10.0000 mg | ORAL_CAPSULE | Freq: Once | ORAL | Status: AC
  Administered 2022-08-01: 10 mg via ORAL
  Filled 2022-08-01: qty 1

## 2022-08-01 MED ORDER — DICYCLOMINE HCL 10 MG PO CAPS: 10.0000 mg | ORAL_CAPSULE | Freq: Three times a day (TID) | ORAL | 0 refills | Status: DC | PRN

## 2022-08-01 NOTE — Telephone Encounter (Signed)
Pt back in the ED. FYI

## 2022-08-01 NOTE — ED Provider Notes (Signed)
[facility] DEPT Provider Note   CSN: [id number] Arrival date & time: 08/01/22  1401     History  Chief Complaint  Patient presents with   Abdominal Pain    ARLYNN STARE is a 76 y.o. female.   Abdominal Pain Associated symptoms: nausea   Patient resents for nausea and abdominal pain.  Medical history includes HTN, chronic low back pain, bipolar disorder, fibromyalgia, HLD, COPD, T2DM, anxiety.  She was seen by PCP 5 days ago as well as at [facility] yesterday for similar symptoms.  She has been prescribed Pepcid, Zofran by PCP.  Yesterday, she received fentanyl, Zofran, Reglan, GI cocktail.  She underwent lab work and CT scan which were reassuring.  She was prescribed additional medications of Reglan and oxycodone.  She was advised to follow-up with GI for possible gastritis etiology.  Today she reports ongoing pain despite new therapies.  It has not worsened.  It is not worsened postprandially.  She does continue to have nausea but is able to tolerate p.o. intake.  She has been drinking plenty of fluids.  She has continued to have normal bowel movements.  She describes the pain is generalized and primarily throughout her lower abdomen.     Home Medications Prior to Admission medications   Medication Sig Start Date End Date Taking? Authorizing Provider  dicyclomine (BENTYL) 10 MG capsule Take 1 capsule (10 mg total) by mouth 3 (three) times daily as needed for spasms (abdominal pain). 08/01/22  Yes Godfrey Pick, MD  Aspirin-Caffeine (BAYER BACK & BODY) 500-32.5 MG TABS Take 1 tablet by mouth daily as needed. Reports takes 1-2 as needed    [provider]  blood glucose meter kit and supplies Dispense based on patient and insurance preference. Use once a day.  DX Code: E11.9 04/15/18   Carollee Herter, Alferd Apa, DO  Clotrimazole 1 % LOTN Apply bid prn 06/13/22   Carollee Herter, Alferd Apa, DO  clotrimazole-betamethasone (LOTRISONE) cream Apply 1  Application topically 2 (two) times daily. 06/12/22   Roma Schanz R, DO  donepezil (ARICEPT) 5 MG tablet TAKE 1 TABLET BY MOUTH EVERYDAY AT BEDTIME 07/16/22   Hurst, Helene Kelp T, PA-C  DULoxetine (CYMBALTA) 20 MG capsule TAKE 1 CAPSULE (20 MG TOTAL) BY MOUTH DAILY. TAKE WITH 60MG TO EQUAL 80MG DAILY 08/01/22   Donnal Moat T, PA-C  DULoxetine (CYMBALTA) 60 MG capsule Take 1 capsule (60 mg total) by mouth daily. With 20 mg=80 mg. 07/16/22   Donnal Moat T, PA-C  famotidine (PEPCID) 20 MG tablet Take 1 tablet (20 mg total) by mouth 2 (two) times daily. 07/27/22   Roma Schanz R, DO  glucose blood test strip Use as instructed once a day.  Dx Code: E11.9 06/20/20   Carollee Herter, Alferd Apa, DO  haloperidol (HALDOL) 1 MG tablet 1 po q am, 2 po qhs. 07/16/22   Hurst, Allice Glassman, PA-C  hydrocortisone-pramoxine (ANALPRAM HC) 2.5-1 % rectal cream Place 1 application rectally 3 (three) times daily. Patient not taking: Reported on 01/25/2022 08/01/15   Midge Minium, MD  Lancets MISC Use as directed once a day.  Dx code: E11.9 01/11/20   Carollee Herter, Alferd Apa, DO  levothyroxine (SYNTHROID) 50 MCG tablet TAKE 1 TABLET BY MOUTH EVERY DAY BEFORE BREAKFAST 06/06/22   Carollee Herter, Yvonne R, DO  lidocaine (LIDODERM) 5 % Place 1 patch onto the skin daily as needed. Remove & Discard patch within 12 hours or as  directed by MD 12/22/21   Parks Ranger, DO  LORazepam (ATIVAN) 1 MG tablet Take 1/2 tab daily as needed and 1 tab at bedtime. 07/16/22   Donnal Moat T, PA-C  metFORMIN (GLUCOPHAGE) 1000 MG tablet TAKE 1 TABLET (1,000 MG TOTAL) BY MOUTH TWICE A DAY WITH FOOD 06/13/22   Carollee Herter, Alferd Apa, DO  metoCLOPramide (REGLAN) 10 MG tablet Take 1 tablet (10 mg total) by mouth every 8 (eight) hours as needed for nausea. 07/31/22   Tegeler, Gwenyth Allegra, MD  ondansetron (ZOFRAN) 4 MG tablet Take 1 tablet (4 mg total) by mouth every 8 (eight) hours as needed for nausea or vomiting. 07/27/22   Carollee Herter, Alferd Apa, DO   oxyCODONE (ROXICODONE) 5 MG immediate release tablet Take 1 tablet (5 mg total) by mouth every 4 (four) hours as needed for severe pain. 07/31/22   Tegeler, Gwenyth Allegra, MD  prazosin (MINIPRESS) 2 MG capsule Take 1 capsule (2 mg total) by mouth at bedtime. 07/16/22   Donnal Moat T, PA-C  QUEtiapine (SEROQUEL) 200 MG tablet Take 1 tablet (200 mg total) by mouth at bedtime. 07/16/22   Donnal Moat T, PA-C  triamcinolone cream (KENALOG) 0.1 % Apply 1 Application topically 2 (two) times daily. 07/27/22   Ann Held, DO      Allergies    Atorvastatin, Codeine, Diclofenac, Doxycycline, Fluconazole, and Sulfonamide derivatives    Review of Systems   Review of Systems  Gastrointestinal:  Positive for abdominal pain and nausea.  All other systems reviewed and are negative.   Physical Exam Updated Vital Signs BP (!) 165/88   Pulse 84   Temp 97.8 F (36.6 C) (Oral)   Resp 17   SpO2 92%  Physical Exam Vitals and nursing note reviewed.  Constitutional:      General: She is not in acute distress.    Appearance: She is well-developed. She is not ill-appearing, toxic-appearing or diaphoretic.  HENT:     Head: Normocephalic and atraumatic.     Mouth/Throat:     Mouth: Mucous membranes are moist.     Pharynx: Oropharynx is clear.  Eyes:     Conjunctiva/sclera: Conjunctivae normal.  Cardiovascular:     Rate and Rhythm: Normal rate and regular rhythm.  Pulmonary:     Effort: Pulmonary effort is normal. No respiratory distress.  Abdominal:     Palpations: Abdomen is soft.     Tenderness: There is no abdominal tenderness.     Hernia: No hernia is present.  Musculoskeletal:        General: No swelling.     Cervical back: Neck supple.  Skin:    General: Skin is warm and dry.     Capillary Refill: Capillary refill takes less than 2 seconds.     Coloration: Skin is not cyanotic or jaundiced.  Neurological:     General: No focal deficit present.     Mental Status: She is alert  and oriented to person, place, and time.  Psychiatric:        Mood and Affect: Mood normal.        Behavior: Behavior normal.     ED Results / Procedures / Treatments   Labs (all labs ordered are listed, but only abnormal results are displayed) Labs Reviewed  COMPREHENSIVE METABOLIC PANEL - Abnormal; Notable for the following components:      Result Value   Sodium 147 (*)    Chloride 112 (*)    Glucose, Bld 122 (*)  Creatinine, Ser 1.06 (*)    GFR, Estimated 54 (*)    All other components within normal limits  URINALYSIS, ROUTINE W REFLEX MICROSCOPIC - Abnormal; Notable for the following components:   Leukocytes,Ua TRACE (*)    All other components within normal limits  CBC WITH DIFFERENTIAL/PLATELET  LIPASE, BLOOD    EKG None  Radiology CT ABDOMEN PELVIS W CONTRAST  Result Date: 07/31/2022 CLINICAL DATA:  Abdominal pain, nausea EXAM: CT ABDOMEN AND PELVIS WITH CONTRAST TECHNIQUE: Multidetector CT imaging of the abdomen and pelvis was performed using the standard protocol following bolus administration of intravenous contrast. RADIATION DOSE REDUCTION: This exam was performed according to the departmental dose-optimization program which includes automated exposure control, adjustment of the mA and/or kV according to patient size and/or use of iterative reconstruction technique. CONTRAST:  161m OMNIPAQUE IOHEXOL 300 MG/ML  SOLN COMPARISON:  12/01/2021 FINDINGS: Lower chest: Visualized lower lung fields are clear. Hepatobiliary: There is fatty infiltration in liver. There is no dilation of bile ducts. Gallbladder is not distended. Pancreas: No focal abnormalities are seen. Spleen: Unremarkable. Adrenals/Urinary Tract: There is mild nodularity in left adrenal with no significant change. There is no hydronephrosis. 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 There are few smooth marginated low-density lesions in the kidneys largest measuring 3.8 cm in size in the right kidney. No  interval changes are noted. There are no renal or ureteral stones. Urinary bladder is not distended. Stomach/Bowel: Stomach is unremarkable. Small bowel loops are not dilated. Appendix is not seen. There is no pericecal inflammation. There is no significant wall thickening in colon. Multiple diverticula are seen in colon without signs of focal acute diverticulitis. Vascular/Lymphatic: Scattered arterial calcifications are seen. No significant lymphadenopathy is seen. Reproductive: Uterus is not seen. Other: There is no ascites or pneumoperitoneum. Musculoskeletal: Degenerative changes are noted in lumbar spine, more severe at L2-L3 level. IMPRESSION: There is no evidence of intestinal obstruction or pneumoperitoneum. There is no hydronephrosis. Fatty liver. Diverticulosis of colon without signs of focal diverticulitis. Renal cysts. Other findings as described in the body of the report. Electronically Signed   By: PElmer PickerM.D.   On: 07/31/2022 14:40    Procedures Procedures    Medications Ordered in ED Medications  dicyclomine (BENTYL) capsule 10 mg (10 mg Oral Given 08/01/22 1531)    ED Course/ Medical Decision Making/ A&P                           Medical Decision Making Risk Prescription drug management.   This patient presents to the ED for concern of abdominal pain, this involves an extensive number of treatment options, and is a complaint that carries with it a high risk of complications and morbidity.  The differential diagnosis includes enteritis, colitis, gastroparesis, IBS   Co morbidities that complicate the patient evaluation  HTN, chronic low back pain, bipolar disorder, fibromyalgia, HLD, COPD, T2DM, anxiety   Additional history obtained:  Additional history obtained from patient's husband External records from outside source obtained and reviewed including EMR   Lab Tests:  I Ordered, and personally interpreted labs.  The pertinent results include: Normal  hemoglobin, no leukocytosis.  Sodium and chloride were slightly elevated.  Electrolytes were otherwise normal.  Lipase is normal.  Cardiac Monitoring: / EKG:  The patient was maintained on a cardiac monitor.  I personally viewed and interpreted the cardiac monitored which showed an underlying rhythm of: Sinus rhythm  Problem List /  ED Course / Critical interventions / Medication management  Patient is a pleasant 76 year old female who presents for ongoing abdominal pain for the past 10 days.  She was seen by her primary care doctor 5 days ago and seen in the ED yesterday.  She has tried multiple new therapies at home.  This includes Zofran, Reglan, oxycodone.  These have not been helpful.  Patient is well-appearing on exam.  Her vital signs are normal.  Her abdomen is soft and she denies any tenderness.  She describes the areas of pain is located throughout her lower abdomen.  Is not worsened postprandially.  Given her description of her symptoms, feel it is unlikely gastritis or PUD.  Given her reassuring work-up yesterday that did include lab work and CT scans, do not think this is any emergent abdominal process.  Patient was given Bentyl.  Lab work, drawn prior to her being bedded in the ED, shows slightly elevated sodium and chloride.  Patient has not had any significant recent fluid losses.  It is unclear why she would have had to this change compared to lab work from yesterday.  It could be secondary to lab error or normal saline containing blood sample.  Following dose of Bentyl, patient reported complete resolution of her discomfort.  She states that it is the first time she has had relief in the past 10 days.  She does request discharge home, as she is tired of being in emergency departments over the past 2 days.  She was advised to discontinue use of narcotics and antiemetics if they have not been helping.  She was prescribed Bentyl to be taken only as needed.  She was advised to keep her GI  appointment for next month.  She was discharged in good condition. I ordered medication including Bentyl for abdominal cramps Reevaluation of the patient after these medicines showed that the patient resolved I have reviewed the patients home medicines and have made adjustments as needed   Social Determinants of Health:  Lives at home with husband        Final Clinical Impression(s) / ED Diagnoses Final diagnoses:  Generalized abdominal pain    Rx / DC Orders ED Discharge Orders          Ordered    dicyclomine (BENTYL) 10 MG capsule  3 times daily PRN        08/01/22 1643              Godfrey Pick, MD 08/01/22 1650

## 2022-08-01 NOTE — Telephone Encounter (Signed)
Appt was canceled yesterday and pt went to the ED.

## 2022-08-01 NOTE — ED Provider Triage Note (Signed)
Emergency Medicine Provider Triage Evaluation Note  EMERA BUSSIE , a 76 y.o. female  was evaluated in triage.  Pt complains of ongoing nausea and abdominal pain.  She was seen at [facility] yesterday and had reassuring labs and CT.  States that symptoms have worsened, prompting emergency department visit.  She has not had pain like this in the past.  Remote appendectomy, no other abdominal surgeries.  Review of Systems  Positive: Abdominal pain and nausea Negative: Fever  Physical Exam  BP (!) 149/97 (BP Location: Left Arm)   Pulse 94   Temp 97.8 F (36.6 C) (Oral)   Resp 16   SpO2 95%  Gen:   Awake, no distress   Resp:  Normal effort  MSK:   Moves extremities without difficulty  Other:    Medical Decision Making  Medically screening exam initiated at 2:38 PM.  Appropriate orders placed.  DESPINA BOAN was informed that the remainder of the evaluation will be completed by another provider, this initial triage assessment does not replace that evaluation, and the importance of remaining in the ED until their evaluation is complete.     Carlisle Cater, PA-C 08/01/22 1454

## 2022-08-01 NOTE — Telephone Encounter (Signed)
Patient's husband called to advise that patient had to go to [facility] yesterday with the abdominal pain and they ran tests and imaging and they couldn't find anything. They recommended patient's PCP to place urgent referral to a GI provider to figure out what is going on. They prescribed her oxycodone and it wasn't helping. Patient would like to know if there is something else she can take instead of that. Please call to advise about alternative medication as well as if urgent referral can be placed in Dr. Nonda Lou absence for her to go to a GI provider. Husband said he can be reached on his mobile number.

## 2022-08-01 NOTE — Discharge Instructions (Signed)
A prescription for Bentyl was sent to your pharmacy.  You can discontinue the use of the narcotics and antiemetics that have been prescribed to you over the past week if these are not helping.  Take Bentyl only as needed.  Keep your follow-up appointment with gastroenterology.  Continue to follow-up with your primary care doctor for ongoing management of your chronic conditions.  Return to the emergency department at any time for any new or worsening symptoms of concern.

## 2022-08-01 NOTE — ED Triage Notes (Signed)
Pt arrived via POV, c/o nausea, abd pain. States seen recently for same, but pain worsening.

## 2022-08-02 ENCOUNTER — Encounter (HOSPITAL_COMMUNITY): Payer: Self-pay

## 2022-08-02 ENCOUNTER — Emergency Department (HOSPITAL_COMMUNITY)
Admission: EM | Admit: 2022-08-02 | Discharge: 2022-08-02 | Discharge status: Against Medical Advice | Payer: Medicare Other | Attending: Emergency Medicine | Admitting: Emergency Medicine

## 2022-08-02 ENCOUNTER — Other Ambulatory Visit: Payer: Self-pay

## 2022-08-02 DIAGNOSIS — Z5321 Procedure and treatment not carried out due to patient leaving prior to being seen by health care provider: Secondary | ICD-10-CM | POA: Insufficient documentation

## 2022-08-02 DIAGNOSIS — R109 Unspecified abdominal pain: Secondary | ICD-10-CM | POA: Diagnosis not present

## 2022-08-02 DIAGNOSIS — R110 Nausea: Secondary | ICD-10-CM | POA: Diagnosis not present

## 2022-08-02 DIAGNOSIS — R1084 Generalized abdominal pain: Secondary | ICD-10-CM | POA: Diagnosis not present

## 2022-08-02 MED ORDER — ONDANSETRON 8 MG PO TBDP
8.0000 mg | ORAL_TABLET | Freq: Once | ORAL | Status: AC
  Administered 2022-08-02: 8 mg via ORAL
  Filled 2022-08-02: qty 1

## 2022-08-02 NOTE — ED Triage Notes (Signed)
Patient c/o generalized abdominal pain and nausea x 10-12 days. Patient was seen yesterday for the same.

## 2022-08-02 NOTE — ED Provider Triage Note (Signed)
Emergency Medicine Provider Triage Evaluation Note  [name] , a 76 y.o. female  was evaluated in triage.  Pt complains of diffuse abdominal pain and nausea.  She has been seen in the past 2 days.  Reports Bentyl was working initially, she woke up this morning with abdominal pain, took a Bentyl and Reglan without relief.   Review of Systems  Positive: As above Negative: Vomiting, diarrhea, fevers  Physical Exam  BP (!) 145/86 (BP Location: Left Arm)   Pulse 99   Temp 98.4 F (36.9 C) (Oral)   Resp 15   Ht '5\' 2"'$  (1.575 m)   Wt 76.7 kg   SpO2 (!) 87%   BMI 30.91 kg/m  Gen:   Awake, no distress   Resp:  Normal effort  MSK:   Moves extremities without difficulty  Other:  Abdomen diffusely tender  Medical Decision Making  Medically screening exam initiated at 11:20 AM.  Appropriate orders placed.  Megan Duncan was informed that the remainder of the evaluation will be completed by another provider, this initial triage assessment does not replace that evaluation, and the importance of remaining in the ED until their evaluation is complete.  We will defer work-up in triage.  No change in symptoms.  Had full work-up the previous 2 days.   Megan Duncan, [place] 08/02/22 1125

## 2022-08-03 ENCOUNTER — Other Ambulatory Visit: Payer: Self-pay | Admitting: Physician Assistant

## 2022-08-06 ENCOUNTER — Other Ambulatory Visit: Payer: Self-pay

## 2022-08-06 MED ORDER — DULOXETINE HCL 20 MG PO CPEP: ORAL_CAPSULE | ORAL | 1 refills | Status: DC

## 2022-08-07 ENCOUNTER — Ambulatory Visit (INDEPENDENT_AMBULATORY_CARE_PROVIDER_SITE_OTHER): Payer: Medicare Other | Admitting: Family

## 2022-08-07 ENCOUNTER — Encounter: Payer: Self-pay | Admitting: Family

## 2022-08-07 VITALS — Ht 62.0 in | Wt 169.2 lb

## 2022-08-07 DIAGNOSIS — R109 Unspecified abdominal pain: Secondary | ICD-10-CM

## 2022-08-07 MED ORDER — DICYCLOMINE HCL 10 MG PO CAPS: 10.0000 mg | ORAL_CAPSULE | Freq: Three times a day (TID) | ORAL | 0 refills | Status: DC

## 2022-08-07 NOTE — Progress Notes (Signed)
Megan Duncan is a 76 y.o. 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   Degenerative lumbar spinal stenosis    Anterolisthesis of lumbar spine    Lump in upper inner quadrant of left breast 07/13/2021   Left flank pain 07/13/2021   Lumbar radiculopathy 01/26/2021   Body mass index (BMI) 36.0-36.9, adult 01/11/2021   Degeneration of lumbar intervertebral disc 08/22/2020   Retrolisthesis of vertebrae 08/22/2020   Foraminal stenosis of lumbar region 08/22/2020   Generalized anxiety disorder 09/01/2018   Type II diabetes mellitus 06/05/2018   Tinea corporis 09/01/2017   Left upper quadrant pain 09/01/2017   Estrogen deficiency 09/01/2017   Thoracic aortic atherosclerosis 05/14/2017   COPD (chronic obstructive pulmonary disease) 10/30/2016   Allergic rhinitis 10/03/2016   Palpitations 07/20/2016   DOE (dyspnea on exertion) 07/20/2016   External hemorrhoid 08/01/2015   Stress incontinence, female 08/01/2015   Dermatitis 06/02/2015   Shingles outbreak 04/14/2015   Hyperlipidemia associated with type 2 diabetes mellitus ([place])    Fibromyalgia 10/14/2014   Bilateral hip pain 09/02/2014   Deformity of finger 07/15/2014   Ceruminosis 03/04/2014   Tinnitus 03/04/2014   Torticollis 03/04/2014   Bipolar 2 disorder 12/03/2013   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-sided sciatica 08/06/2012   Lower abdominal pain 08/06/2012   Otitis media 09/05/2010   Peripheral neuropathy 02/22/2010   Lumbar back pain with radiculopathy affecting left lower extremity 01/24/2010   Cervicalgia 06/20/2007   Chronic low back pain 06/20/2007   Essential (primary) hypertension 04/23/2007    Inflammatory disease of uterus 04/23/2007    Current Outpatient Medications  Medication Sig Dispense Refill   Aspirin-Caffeine (BAYER BACK & BODY) 500-32.5 MG TABS Take 1 tablet by mouth daily as needed. Reports takes 1-2 as needed     blood glucose meter kit and supplies Dispense based on patient and insurance preference. Use once a day.  DX Code: E11.9 1 each 0   Clotrimazole 1 % LOTN Apply bid prn 30 mL 2   clotrimazole-betamethasone (LOTRISONE) cream Apply 1 Application topically 2 (two) times daily. 30 g 0   donepezil (ARICEPT) 5 MG tablet TAKE 1 TABLET BY MOUTH EVERYDAY AT BEDTIME 90 tablet 1   DULoxetine (CYMBALTA) 20 MG capsule TAKE 1 CAPSULE (20 MG TOTAL) BY MOUTH DAILY. TAKE WITH 60MG TO EQUAL 80MG DAILY 90 capsule 1   DULoxetine (CYMBALTA) 60 MG capsule Take 1 capsule (60 mg total) by mouth daily. With 20 mg=80 mg. 90 capsule 1   famotidine (PEPCID) 20 MG tablet Take 1 tablet (20 mg total) by mouth 2 (two) times daily. 60 tablet 2   glucose blood test strip Use as instructed once a day.  Dx Code: E11.9 30 each 2   haloperidol (HALDOL) 1 MG tablet 1 po q am, 2 po qhs. 90 tablet 5   hydrocortisone-pramoxine (ANALPRAM HC) 2.5-1 % rectal cream Place 1 application rectally 3 (three) times daily. 30 g 3   Lancets MISC Use as directed once a day.  Dx code: E11.9 30 each 2   levothyroxine (SYNTHROID) 50 MCG tablet TAKE 1 TABLET BY MOUTH EVERY DAY BEFORE BREAKFAST 90 tablet 1   lidocaine (LIDODERM) 5 % Place  1 patch onto the skin daily as needed. Remove & Discard patch within 12 hours or as directed by MD 30 patch 0   LORazepam (ATIVAN) 1 MG tablet Take 1/2 tab daily as needed and 1 tab at bedtime. 45 tablet 5   metFORMIN (GLUCOPHAGE) 1000 MG tablet TAKE 1 TABLET (1,000 MG TOTAL) BY MOUTH TWICE A DAY WITH FOOD 180 tablet 1   metoCLOPramide (REGLAN) 10 MG tablet Take 1 tablet (10 mg total) by mouth every 8 (eight) hours as needed for nausea. 15 tablet 0   ondansetron (ZOFRAN) 4 MG tablet Take 1  tablet (4 mg total) by mouth every 8 (eight) hours as needed for nausea or vomiting. 20 tablet 0   oxyCODONE (ROXICODONE) 5 MG immediate release tablet Take 1 tablet (5 mg total) by mouth every 4 (four) hours as needed for severe pain. 15 tablet 0   prazosin (MINIPRESS) 2 MG capsule Take 1 capsule (2 mg total) by mouth at bedtime. 90 capsule 1   QUEtiapine (SEROQUEL) 200 MG tablet Take 1 tablet (200 mg total) by mouth at bedtime. 90 tablet 1   triamcinolone cream (KENALOG) 0.1 % Apply 1 Application topically 2 (two) times daily. 30 g 0   dicyclomine (BENTYL) 10 MG capsule Take 1 capsule (10 mg total) by mouth 4 (four) times daily -  before meals and at bedtime. 120 capsule 0   No current facility-administered medications for this visit.    Allergies: Atorvastatin, Codeine, Diclofenac, Doxycycline, Fluconazole, and Sulfonamide derivatives  Past Medical History:  Diagnosis Date   Acute pain of right shoulder 09/24/2018   Acute upper respiratory infection 09/19/2010   Allergic rhinitis 10/03/2016   Anterolisthesis of lumbar spine    Arthritis    hands   Bilateral hip pain 09/02/2014   Bipolar II disorder    Cellulitis and abscess of trunk 09/15/2009   Ceruminosis 03/04/2014   Cervicalgia 06/20/2007   Chronic low back pain 06/20/2007   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-sided sciatica 08/06/2012   COPD (chronic obstructive pulmonary disease) ([place])    Deformity of finger 07/15/2014   Degeneration of lumbar intervertebral disc 08/22/2020   Degenerative lumbar spinal stenosis    Dermatitis 60/60/0459   Diastolic dysfunction    Per pt, diagnosed after [facility]   Diverticulosis    DOE (dyspnea on exertion) 07/20/2016   Essential (primary) hypertension 04/23/2007   Estrogen deficiency 09/01/2017   External hemorrhoid 08/01/2015   Fibromyalgia    Foraminal stenosis of lumbar region 08/22/2020   Generalized anxiety disorder 09/01/2018   Hyperlipidemia    Inflammatory disease of uterus  04/23/2007   Left flank pain 07/13/2021   Left upper quadrant pain 09/01/2017   Lower abdominal pain 08/06/2012   Lumbar back pain with radiculopathy affecting left lower extremity 01/24/2010   Lumbar radiculopathy 01/26/2021   Lump in upper inner quadrant of left breast 07/13/2021   Major depressive disorder    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   Otitis media 09/05/2010   Palpitations 07/20/2016   Peripheral neuropathy 02/22/2010   Retrolisthesis of vertebrae 08/22/2020   Shingles outbreak 04/14/2015   Sinusitis, acute maxillary 09/14/2013   Stress incontinence, female 08/01/2015   Thoracic aortic atherosclerosis 05/14/2017   Tinea corporis 09/01/2017   Tinnitus 03/04/2014   Torticollis 03/04/2014   Type II diabetes mellitus 06/05/2018    Past Surgical History:  Procedure Laterality Date   APPENDECTOMY     EYE SURGERY     KNEE ARTHROSCOPY  Right 11/12/2006   TONSILLECTOMY     TOTAL ABDOMINAL HYSTERECTOMY      Family History  Problem Relation Age of Onset   Hypertension Mother    Hiatal hernia Mother    Diabetes Father    Hypertension Father    Heart attack Father    Alcoholism Father    Bipolar disorder Father    Mental illness Sister    Suicidality Sister    Colon cancer Maternal Aunt 3   Breast cancer Maternal Aunt    Bipolar disorder Paternal Aunt     Social History   Tobacco Use   Smoking status: Former    Packs/day: 1.50    Years: 40.00    Total pack years: 60.00    Types: Cigarettes    Quit date: 07/02/2004    Years since quitting: 18.1    Passive exposure: Past   Smokeless tobacco: Never   Tobacco comments:    Verified by Aldona Lento (Husband), & Lauralee Evener (Daughter)  Substance Use Topics   Alcohol use: Yes    Alcohol/week: 0.0 - 1.0 standard drinks of alcohol    Comment: occasional wine    Subjective:  Accompanied by husband; has been having increased episodes of abdominal pain; has been seen 3 times in  the past 2 weeks; CT done at ER last week was reassuring; recommended GI follow up but unable to get seen before end of October; asking for options for specialist follow up as soon as possible. Does find Bentyl beneficial and asking for refill on medication.     Objective:  Vitals:   08/07/22 1303  Weight: 169 lb 3.2 oz (76.7 kg)  Height: '5\' 2"'  (1.575 m)    General: Well developed, well nourished, in no acute distress  Skin : Warm and dry.  Head: Normocephalic and atraumatic  Eyes: Sclera and conjunctiva clear; pupils round and reactive to light; extraocular movements intact  Ears: External normal; canals clear; tympanic membranes normal  Oropharynx: Pink, supple. No suspicious lesions  Neck: Supple without thyromegaly, adenopathy  Lungs: Respirations unlabored; Abdomen: Soft; nontender; nondistended; normoactive bowel sounds; no masses or hepatosplenomegaly  Neurologic: Alert and oriented; speech intact; face symmetrical; in wheelchair today;  Assessment:  1. Abdominal pain, unspecified abdominal location     Plan:  Urgent referral to GI; refill on Bentyl to use as needed; follow up with her PCP with continued concerns/ questions.   No follow-ups on file.  Orders Placed This Encounter  Procedures   Ambulatory referral to Gastroenterology    Referral Priority:   Emergency    Referral Type:   Consultation    Referral Reason:   Specialty Services Required    Number of Visits Requested:   1    Requested Prescriptions   Signed Prescriptions Disp Refills   dicyclomine (BENTYL) 10 MG capsule 120 capsule 0    Sig: Take 1 capsule (10 mg total) by mouth 4 (four) times daily -  before meals and at bedtime.

## 2022-08-10 ENCOUNTER — Telehealth: Payer: Self-pay | Admitting: Family Medicine

## 2022-08-10 ENCOUNTER — Telehealth: Payer: Self-pay | Admitting: *Deleted

## 2022-08-10 NOTE — Telephone Encounter (Signed)
     Patient  visit on 08/01/2022  at [facility] long ed  was for nausea  Have you been able to follow up with your primary care physician? Yes and will see a gastro dr as well as soon as they can get her in   The patient was able to obtain any needed medicine or equipment.  Are there diet recommendations that you are having difficulty following?  Patient expresses understanding of discharge instructions and education provided has no other needs at this time.   [place] [phone] [address] , [place] [address] Email : [email or web address]

## 2022-08-10 NOTE — Telephone Encounter (Signed)
I have called the pt back and explained to him that we have placed the referral for GI as urgent. We have gotten one of the places approved and we are sorry that I am unable to dictate what another office is doing. I do appreciate that he is being an advocate for his wife and understand that they have come the ED and the office twice on the matter. We do have a referral team that takes care of the referrals that come in. I have informed him that we will send the message to our referral coordinator to see if they can assist to find a sooner appointment than November and late October.   Pt stated understanding and thanked me for my time and I thanked him for being a great advocate to his wife.   Gwen do you mind sending the referral to another GI office to see if they can see the pt any sooner.

## 2022-08-10 NOTE — Telephone Encounter (Signed)
Patient's husband, [name], called to follow up on GI referral. Provided him with the contact info for referral so he can call and get appointment. Husband called their office and their next available is in November which will not help. It was their understanding that our team would be calling and scheduling an urgent appointment on patient's behalf. Patient and her husband are getting frustrated that they are not making any forward movement. Please call to advise of appointment with GI office.

## 2022-08-10 NOTE — Telephone Encounter (Signed)
Patient called back frustrated stating he has not heard anything back, he states they have no heard anything back from Megan Duncan since Tuesday and he was told this was going to be treated as an emergency. He states he needs to have a call back today and cannot wait until Monday. Please advise.

## 2022-08-13 ENCOUNTER — Encounter: Payer: Self-pay | Admitting: Gastroenterology

## 2022-08-13 NOTE — Telephone Encounter (Signed)
I have called and spoke to pt husband. I gave them an update on getting an appointment scheduled for the pt. Her husband is still a bit upset about not getting an appointment sooner than Oct 31st and I have informed him that we have called and tried a few different places, but since the pt was not seen in over 3 years by [facility] she will be considered establishing care again. Her husband did state understanding. I have informed him that we are trying to keep him as updated as we can. They are now scheduled for an appointment and they are on the cancellation list as I have informed them to ask about prior.   I have called [facility] to see if they have an earlier appt and there was no luck. I was on the phone for 15 min to see if they have anything earlier and they did not.

## 2022-08-15 ENCOUNTER — Encounter: Payer: Self-pay | Admitting: Physician Assistant

## 2022-08-15 ENCOUNTER — Ambulatory Visit (INDEPENDENT_AMBULATORY_CARE_PROVIDER_SITE_OTHER): Payer: Medicare Other | Admitting: Physician Assistant

## 2022-08-15 VITALS — Wt 168.2 lb

## 2022-08-15 DIAGNOSIS — F4310 Post-traumatic stress disorder, unspecified: Secondary | ICD-10-CM | POA: Diagnosis not present

## 2022-08-15 DIAGNOSIS — F3181 Bipolar II disorder: Secondary | ICD-10-CM | POA: Diagnosis not present

## 2022-08-15 DIAGNOSIS — F411 Generalized anxiety disorder: Secondary | ICD-10-CM | POA: Diagnosis not present

## 2022-08-15 DIAGNOSIS — G2401 Drug induced subacute dyskinesia: Secondary | ICD-10-CM

## 2022-08-15 DIAGNOSIS — G2571 Drug induced akathisia: Secondary | ICD-10-CM

## 2022-08-15 DIAGNOSIS — F32A Depression, unspecified: Secondary | ICD-10-CM

## 2022-08-15 MED ORDER — MIRTAZAPINE 7.5 MG PO TABS: ORAL_TABLET | ORAL | 1 refills | Status: DC

## 2022-08-15 NOTE — Progress Notes (Signed)
[facility]  Patient ID: Megan Duncan,  MRN: [id number]  PCP: Ann Held, DO  Date of Evaluation: 08/15/2022  Time spent:40 minutes  Chief Complaint:  Chief Complaint   Anxiety; Depression; Hallucinations; Follow-up    HISTORY/CURRENT STATUS: HPI For routine follow-up.  Daughter [name], husband [name] are present.  States she is having "jitteriness" all over but especially in her stomach.  She has been to the ER twice since our last visit, records on chart.  She was given Bentyl the last time which has helped her stomach quite a bit.  But she still has the jitteriness all over her body.  Feels like she wants to move around a lot, but due to her chronic back pain and generalized weakness she is unable to do so.  She still has the tremor in her chin but no abnormal tongue movements, tics, or truncal movements.  It is difficult to say for sure when these feelings began but probably around the time that Haldol was increased on 07/16/2022.  The patient wonders if the abdominal discomfort is related to the metformin.  She has an appointment with a gastroenterologist in a few weeks for further evaluation.  Unable to enjoy much of anything.  Her physical health is most of the problem.  Has chronic severe lumbar pain.  Not worse.  Energy and motivation are low.  ADLs are limited due to generalized weakness.  Personal hygiene is normal.  Appetite is normal and weight is stable.  Isolates, mostly stays in her bedroom which is upstairs, occasionally goes downstairs.  Again, caused by her physical problems.  Not crying easily.  No suicidal or homicidal thoughts.  Continues to have irritability at times but might be a little better.  She is sleeping through the night more than she was before increasing the Haldol a month ago.  Still anxious at times, Ativan is helpful.  No panic attacks.  She is only taking 0.5 mg during the day as needed and always takes 1 mg at night.  Has not  taken it for the "jitteriness" so not sure if that would be beneficial or not.  Patient denies increased energy with decreased need for sleep, increased talkativeness, racing thoughts, impulsivity or risky behaviors, increased spending, increased libido, grandiosity, paranoia, or hallucinations.  Review of Systems  Constitutional:  Positive for malaise/fatigue.  HENT:  Positive for hearing loss.   Eyes: Negative.   Respiratory: Negative.    Cardiovascular: Negative.   Gastrointestinal:  Positive for abdominal pain.       See HPI and ER notes.  Genitourinary: Negative.   Musculoskeletal:  Positive for back pain.  Skin: Negative.   Neurological:  Positive for weakness.       Weakness is generalized.  No focal neurologic deficits  Endo/Heme/Allergies: Negative.   Psychiatric/Behavioral:         See HPI   Individual Medical History/ Review of Systems: Changes? :Yes      GI pain, to ER twice, was given Bentyl. Helped pain. Has appt w/ GI in a few weeks.   Past medications for mental health diagnoses include: Trileptal Lamictal Prozac Zoloft Effexor XR, Cymbalta, Paxil, lithium, Depakote, Latuda, Risperdal, Tegretol, Wellbutrin, propranolol for tremor, Thorazine  Haldol and Zyprexa given for a few days inpatient January 2023, propranolol for lithium tremor  Hospitalized in January 2023 at [facility] behavioral health for altered mental status with hallucinations, paranoia, and aggressive behavior  Allergies: Atorvastatin, Codeine, Diclofenac, Doxycycline, Fluconazole, and Sulfonamide  derivatives  Current Medications:  Current Outpatient Medications:    Aspirin-Caffeine (BAYER BACK & BODY) 500-32.5 MG TABS, Take 1 tablet by mouth daily as needed. Reports takes 1-2 as needed, Disp: , Rfl:    blood glucose meter kit and supplies, Dispense based on patient and insurance preference. Use once a day.  DX Code: E11.9, Disp: 1 each, Rfl: 0   Clotrimazole 1 % LOTN, Apply bid prn, Disp: 30  mL, Rfl: 2   clotrimazole-betamethasone (LOTRISONE) cream, Apply 1 Application topically 2 (two) times daily., Disp: 30 g, Rfl: 0   dicyclomine (BENTYL) 10 MG capsule, Take 1 capsule (10 mg total) by mouth 4 (four) times daily -  before meals and at bedtime., Disp: 120 capsule, Rfl: 0   donepezil (ARICEPT) 5 MG tablet, TAKE 1 TABLET BY MOUTH EVERYDAY AT BEDTIME, Disp: 90 tablet, Rfl: 1   DULoxetine (CYMBALTA) 20 MG capsule, TAKE 1 CAPSULE (20 MG TOTAL) BY MOUTH DAILY. TAKE WITH 60MG TO EQUAL 80MG DAILY, Disp: 90 capsule, Rfl: 1   DULoxetine (CYMBALTA) 60 MG capsule, Take 1 capsule (60 mg total) by mouth daily. With 20 mg=80 mg., Disp: 90 capsule, Rfl: 1   famotidine (PEPCID) 20 MG tablet, Take 1 tablet (20 mg total) by mouth 2 (two) times daily., Disp: 60 tablet, Rfl: 2   glucose blood test strip, Use as instructed once a day.  Dx Code: E11.9, Disp: 30 each, Rfl: 2   haloperidol (HALDOL) 1 MG tablet, 1 po q am, 2 po qhs., Disp: 90 tablet, Rfl: 5   hydrocortisone-pramoxine (ANALPRAM HC) 2.5-1 % rectal cream, Place 1 application rectally 3 (three) times daily., Disp: 30 g, Rfl: 3   Lancets MISC, Use as directed once a day.  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 Remove & Discard patch within 12 hours or as directed by MD, Disp: 30 patch, Rfl: 0   LORazepam (ATIVAN) 1 MG tablet, Take 1/2 tab daily as needed and 1 tab at bedtime., Disp: 45 tablet, Rfl: 5   metFORMIN (GLUCOPHAGE) 1000 MG tablet, TAKE 1 TABLET (1,000 MG TOTAL) BY MOUTH TWICE A DAY WITH FOOD, Disp: 180 tablet, Rfl: 1   metoCLOPramide (REGLAN) 10 MG tablet, Take 1 tablet (10 mg total) by mouth every 8 (eight) hours as needed for nausea., Disp: 15 tablet, Rfl: 0   mirtazapine (REMERON) 7.5 MG tablet, 1 po qhs for 2 nights, then 2 po qhs, Disp: 30 tablet,  Rfl: 1   ondansetron (ZOFRAN) 4 MG tablet, Take 1 tablet (4 mg total) by mouth every 8 (eight) hours as needed for nausea or vomiting., Disp: 20 tablet, Rfl: 0   prazosin (MINIPRESS) 2 MG capsule, Take 1 capsule (2 mg total) by mouth at bedtime., Disp: 90 capsule, Rfl: 1   QUEtiapine (SEROQUEL) 200 MG tablet, Take 1 tablet (200 mg total) by mouth at bedtime., Disp: 90 tablet, Rfl: 1   triamcinolone cream (KENALOG) 0.1 %, Apply 1 Application topically 2 (two) times daily., Disp: 30 g, Rfl: 0   oxyCODONE (ROXICODONE) 5 MG immediate release tablet, Take 1 tablet (5 mg total) by mouth every 4 (four) hours as needed for severe pain. (Patient not taking: Reported on 08/15/2022), Disp: 15 tablet, Rfl: 0 Medication Side Effects: none  Family Medical/ Social History: Changes? No  MENTAL HEALTH EXAM:  Weight 168 lb 3.2 oz (76.3 kg).Body mass index is 30.76 kg/m.  General Appearance: Casual, Well Groomed, and obese  Eye Contact:  Good  Speech:  Clear and Coherent and Normal Rate  Volume:  Normal  Mood:  Anxious  Affect:  Anxious  Thought Process:  Goal Directed and Descriptions of Associations: Circumstantial  Orientation:  Full (Time, Place, and Person)  Thought Content: Logical   Suicidal Thoughts:  No  Homicidal Thoughts:  No  Memory:  Immediate;   Poor Recent;   Poor Remote;   Good  Judgement:  Good  Insight:  Good  Psychomotor Activity:   Tremor of her chin, no other tremor or shaking.  Walks without assistance but slowly    Concentration:  Concentration: Fair and Attention Span: Good  Recall:  Good  Fund of Knowledge: Good  Language: Good  Assets:  Desire for Improvement  ADL's:  Intact  Cognition: WNL  Prognosis:  Good   ER notes/labs/images from 07/31/2022, 08/01/2022-08/02/2022 were reviewed. Pertinent Labs to psychiatric medicines: 08/01/2022 CBC with differential completely normal CMP sodium 147, glucose 122, creatinine 1.06, otherwise normal  DIAGNOSES:    ICD-10-CM   1.  Bipolar II disorder ([place])  F31.81     2. Akathisia  G25.71     3. Tardive dyskinesia  G24.01     4. Generalized anxiety disorder  F41.1     5. PTSD (post-traumatic stress disorder)  F43.10     6. Depression, unspecified depression type  F32.A      Receiving Psychotherapy: No   RECOMMENDATIONS:  PDMP reviewed.  Ativan filled 07/06/2022.   I provided 40 minutes of face to face time during this encounter, including time spent before and after the visit in records review, medical decision making, counseling pertinent to today's visit, and charting.   We discussed her symptoms.  She is describing akathisia, I think however the abdominal "jitteriness" and pain is puzzling.  I recommend treating for akathisia and explained my rationale to the patient, her daughter, and her husband.  They would like to give the treatment or try.  We could either increase the Ativan frequency but I am concerned that would cause too much sedation throughout the day.  I recommend adding mirtazapine starting with a very low dose and if that does not cause too much drowsiness and then will increase dose.    Decreasing the dose of one of the antipsychotics may be beneficial, however she was hospitalized back in the wintertime for psychosis and after discharge from hospital a few months later I attempted decreasing the Haldol.  That did not go well at all, hallucinations recurred and she was having night terrors so the dose was increased back to discharge from hospital dosing.  She has been fairly stable except for sundowning about 6 weeks ago when the Haldol was increased.  Ambrose Mantle, [name], and I do not want to chance that so I will make no changes in the Haldol or Seroquel.  [name] states that even if the Haldol or Seroquel is causing her symptoms she would rather deal with that versus becoming psychotic again.  Her husband and daughter agree.  We again discussed the tardive dyskinesia.  Patient nor her family want to  add another medication for that problem, at least for now.  It is not affecting her eating, she is not chewing her tongue or buccal mucosa so it really does not bother her much.  At this point no further  medication changes will be made.  Continue Aricept 5 mg, 1 p.o. nightly at 8 PM. Continue Cymbalta 80 mg daily. Continue Haldol 1 mg, 1 p.o. q am, and 2 po qhs. (ok to take an extra 1/2 pill in the middle of the night if she becomes really agitated.  Let me know the next day if they have to do that.) Continue Ativan 1 mg, 1 p.o. nightly but also can take 1/2-1 po during day prn.  (And an extra 1/2 pill during the night if needed.) Start mirtazapine 7.5 mg, 1 p.o. nightly for 2 nights then increase to 2 p.o. nightly. Continue prazosin 2 mg p.o. nightly at 8 PM. Continue Seroquel 200 mg, 1 p.o. nightly at 8 PM. Return in 2 weeks.  Donnal Moat, PA-C

## 2022-08-22 ENCOUNTER — Other Ambulatory Visit: Payer: Self-pay | Admitting: Physician Assistant

## 2022-08-24 ENCOUNTER — Other Ambulatory Visit: Payer: Self-pay | Admitting: Physician Assistant

## 2022-08-28 ENCOUNTER — Encounter: Payer: Self-pay | Admitting: Physician Assistant

## 2022-08-28 ENCOUNTER — Ambulatory Visit (INDEPENDENT_AMBULATORY_CARE_PROVIDER_SITE_OTHER): Payer: Medicare Other | Admitting: Physician Assistant

## 2022-08-28 DIAGNOSIS — F411 Generalized anxiety disorder: Secondary | ICD-10-CM | POA: Diagnosis not present

## 2022-08-28 DIAGNOSIS — G2401 Drug induced subacute dyskinesia: Secondary | ICD-10-CM | POA: Diagnosis not present

## 2022-08-28 DIAGNOSIS — F3181 Bipolar II disorder: Secondary | ICD-10-CM

## 2022-08-28 DIAGNOSIS — G2571 Drug induced akathisia: Secondary | ICD-10-CM

## 2022-08-28 MED ORDER — MIRTAZAPINE 7.5 MG PO TABS: ORAL_TABLET | ORAL | 1 refills | Status: DC

## 2022-08-28 MED ORDER — DONEPEZIL HCL 5 MG PO TABS: 5.0000 mg | ORAL_TABLET | Freq: Every day | ORAL | 1 refills | Status: DC

## 2022-08-28 MED ORDER — DULOXETINE HCL 20 MG PO CPEP: ORAL_CAPSULE | ORAL | 1 refills | Status: DC

## 2022-08-28 NOTE — Progress Notes (Signed)
[facility]  Patient ID: Megan Duncan,  MRN: [id number]  PCP: Ann Held, DO  Date of Evaluation: 08/28/2022  Time spent:30 minutes  Chief Complaint:  Chief Complaint   Follow-up    HISTORY/CURRENT STATUS: HPI For routine follow-up.  Daughter Megan Duncan, husband Megan Duncan are present.  Two wks ago, we added Mirtazepine.  Has been very helpful for the internal shaking she was feeling. Also helps her sleep. Still has mandibular tremor, no change there. Not biting tongue or buccal mucosa.  Mood is good. Patient is able to enjoying some things now, still stays in bed a lot of the time d/t back pain. No extreme sadness, tearfulness, or feelings of hopelessness.  ADL are limited d/t pain.  Denies any changes in remembering things.  Appetite has not changed.  Weight is stable.   Denies suicidal or homicidal thoughts.  Patient denies increased energy with decreased need for sleep, increased talkativeness, racing thoughts, impulsivity or risky behaviors, increased spending, increased libido, grandiosity, increased irritability or anger, paranoia, or hallucinations.  Still gets anxious at times, Ativan helps. Usually on taking at night.   Review of Systems  Constitutional: Negative.   HENT: Negative.    Eyes: Negative.   Respiratory: Negative.    Cardiovascular: Negative.   Gastrointestinal: Negative.   Genitourinary: Negative.   Musculoskeletal:  Positive for back pain.  Skin: Negative.   Neurological: Negative.   Endo/Heme/Allergies: Negative.   Psychiatric/Behavioral:         See HPI     Individual Medical History/ Review of Systems: Changes? :No         Past medications for mental health diagnoses include: Trileptal Lamictal Prozac Zoloft Effexor XR, Cymbalta, Paxil, lithium, Depakote, Latuda, Risperdal, Tegretol, Wellbutrin, propranolol for tremor, Thorazine  Haldol and Zyprexa given for a few days inpatient January 2023, propranolol for lithium  tremor  Hospitalized in January 2023 at [facility] for altered mental status with hallucinations, paranoia, and aggressive behavior  Allergies: Atorvastatin, Codeine, Diclofenac, Doxycycline, Fluconazole, and Sulfonamide derivatives  Current Medications:  Current Outpatient Medications:    Aspirin-Caffeine (BAYER BACK & BODY) 500-32.5 MG TABS, Take 1 tablet by mouth daily as needed. Reports takes 1-2 as needed, Disp: , Rfl:    blood glucose meter kit and supplies, Dispense based on patient and insurance preference. Use once a day.  DX Code: E11.9, Disp: 1 each, Rfl: 0   Clotrimazole 1 % LOTN, Apply bid prn, Disp: 30 mL, Rfl: 2   clotrimazole-betamethasone (LOTRISONE) cream, Apply 1 Application topically 2 (two) times daily., Disp: 30 g, Rfl: 0   DULoxetine (CYMBALTA) 60 MG capsule, Take 1 capsule (60 mg total) by mouth daily. With 20 mg=80 mg., Disp: 90 capsule, Rfl: 1   famotidine (PEPCID) 20 MG tablet, Take 1 tablet (20 mg total) by mouth 2 (two) times daily., Disp: 60 tablet, Rfl: 2   glucose blood test strip, Use as instructed once a day.  Dx Code: E11.9, Disp: 30 each, Rfl: 2   haloperidol (HALDOL) 1 MG tablet, 1 po q am, 2 po qhs., Disp: 90 tablet, Rfl: 5   hydrocortisone-pramoxine (ANALPRAM HC) 2.5-1 % rectal cream, Place 1 application rectally 3 (three) times daily., Disp: 30 g, Rfl: 3   Lancets MISC, Use as directed once a day.  Dx code: E11.9, Disp: 30 each, Rfl: 2   levothyroxine (SYNTHROID) 50 MCG tablet, TAKE 1 TABLET BY MOUTH EVERY DAY BEFORE BREAKFAST, Disp: 90 tablet, Rfl: 1   lidocaine (  LIDODERM) 5 %, Place 1 patch onto the skin daily as needed. Remove & Discard patch within 12 hours or as directed by MD, Disp: 30 patch, Rfl: 0   LORazepam (ATIVAN) 1 MG tablet, Take 1/2 tab daily as needed and 1 tab at bedtime., Disp: 45 tablet, Rfl: 5   metFORMIN (GLUCOPHAGE) 1000 MG tablet, TAKE 1 TABLET (1,000 MG TOTAL) BY MOUTH TWICE A DAY WITH FOOD, Disp: 180  tablet, Rfl: 1   metoCLOPramide (REGLAN) 10 MG tablet, Take 1 tablet (10 mg total) by mouth every 8 (eight) hours as needed for nausea., Disp: 15 tablet, Rfl: 0   ondansetron (ZOFRAN) 4 MG tablet, Take 1 tablet (4 mg total) by mouth every 8 (eight) hours as needed for nausea or vomiting., Disp: 20 tablet, Rfl: 0   prazosin (MINIPRESS) 2 MG capsule, Take 1 capsule (2 mg total) by mouth at bedtime., Disp: 90 capsule, Rfl: 1   QUEtiapine (SEROQUEL) 200 MG tablet, Take 1 tablet (200 mg total) by mouth at bedtime., Disp: 90 tablet, Rfl: 1   triamcinolone cream (KENALOG) 0.1 %, Apply 1 Application topically 2 (two) times daily., Disp: 30 g, Rfl: 0   dicyclomine (BENTYL) 10 MG capsule, TAKE 1 CAPSULE (10 MG TOTAL) BY MOUTH 4 TIMES A DAY BEFORE MEALS AND AT BEDTIME, Disp: 360 capsule, Rfl: 0   donepezil (ARICEPT) 5 MG tablet, Take 1 tablet (5 mg total) by mouth at bedtime., Disp: 90 tablet, Rfl: 1   DULoxetine (CYMBALTA) 20 MG capsule, TAKE 1 CAPSULE (20 MG TOTAL) BY MOUTH DAILY. TAKE WITH 60MG TO EQUAL 80MG DAILY, Disp: 90 capsule, Rfl: 1   mirtazapine (REMERON) 7.5 MG tablet, TAKE 2 TABLETS BY MOUTH AT BEDTIME DAILY., Disp: 180 tablet, Rfl: 1   oxyCODONE (ROXICODONE) 5 MG immediate release tablet, Take 1 tablet (5 mg total) by mouth every 4 (four) hours as needed for severe pain. (Patient not taking: Reported on 08/15/2022), Disp: 15 tablet, Rfl: 0 Medication Side Effects: none  Family Medical/ Social History: Changes? No  MENTAL HEALTH EXAM:  There were no vitals taken for this visit.There is no height or weight on file to calculate BMI.  General Appearance: Casual, Well Groomed, and obese  Eye Contact:  Good  Speech:  Clear and Coherent and Normal Rate  Volume:  Normal  Mood:  Euthymic  Affect:  Anxious  Thought Process:  Goal Directed and Descriptions of Associations: Circumstantial  Orientation:  Full (Time, Place, and Person)  Thought Content: Logical   Suicidal Thoughts:  No  Homicidal  Thoughts:  No  Memory:  Immediate;   Poor Recent;   Poor Remote;   Good  Judgement:  Good  Insight:  Good  Psychomotor Activity:   Tremor of her chin, no other tremor or shaking.  Walks without assistance but slowly    Concentration:  Concentration: Fair and Attention Span: Good  Recall:  Good  Fund of Knowledge: Good  Language: Good  Assets:  Desire for Improvement  ADL's:  Intact  Cognition: WNL  Prognosis:  Good   DIAGNOSES:    ICD-10-CM   1. Bipolar II disorder ([place])  F31.81     2. Akathisia  G25.71     3. Generalized anxiety disorder  F41.1     4. Tardive dyskinesia  G24.01       Receiving Psychotherapy: No   RECOMMENDATIONS:  PDMP reviewed.  Ativan filled 08/17/2022. I provided 30 minutes of face to face time during this encounter, including time spent before  and after the visit in records review, medical decision making, counseling pertinent to today's visit, and charting.   Is much better overall. Again disc TD. She and husband and dtr feel like it's tolerable so don't want to treat. She's better on current meds than has been this year so none of [name] want to make changes.   Continue Aricept 5 mg, 1 p.o. nightly at 8 PM. Continue Cymbalta 80 mg daily. Continue Haldol 1 mg, 1 p.o. q am, and 2 po qhs. (ok to take an extra 1/2 pill in the middle of the night if she becomes really agitated.  Let me know the next day if they have to do that.) Continue Ativan 1 mg, 1 p.o. nightly but also can take 1/2-1 po during day prn.  (And an extra 1/2 pill during the night if needed.) Continue mirtazapine 7.5 mg, 1- 2 p.o. nightly. Continue prazosin 2 mg p.o. nightly at 8 PM. Continue Seroquel 200 mg, 1 p.o. nightly at 8 PM. Return in 4 weeks.  Donnal Moat, PA-C

## 2022-08-30 ENCOUNTER — Other Ambulatory Visit: Payer: Self-pay | Admitting: Family

## 2022-09-07 ENCOUNTER — Other Ambulatory Visit: Payer: Self-pay | Admitting: Family Medicine

## 2022-09-09 ENCOUNTER — Encounter: Payer: Self-pay | Admitting: Physician Assistant

## 2022-09-11 ENCOUNTER — Ambulatory Visit: Payer: Medicare Other | Admitting: Gastroenterology

## 2022-09-26 ENCOUNTER — Telehealth (INDEPENDENT_AMBULATORY_CARE_PROVIDER_SITE_OTHER): Payer: Medicare Other | Admitting: Physician Assistant

## 2022-09-26 ENCOUNTER — Encounter: Payer: Self-pay | Admitting: Physician Assistant

## 2022-09-26 DIAGNOSIS — G2571 Drug induced akathisia: Secondary | ICD-10-CM | POA: Diagnosis not present

## 2022-09-26 DIAGNOSIS — F3181 Bipolar II disorder: Secondary | ICD-10-CM

## 2022-09-26 DIAGNOSIS — F411 Generalized anxiety disorder: Secondary | ICD-10-CM | POA: Diagnosis not present

## 2022-09-26 DIAGNOSIS — F4310 Post-traumatic stress disorder, unspecified: Secondary | ICD-10-CM | POA: Diagnosis not present

## 2022-09-26 DIAGNOSIS — F32A Depression, unspecified: Secondary | ICD-10-CM

## 2022-09-26 DIAGNOSIS — G2401 Drug induced subacute dyskinesia: Secondary | ICD-10-CM

## 2022-09-26 MED ORDER — MIRTAZAPINE 7.5 MG PO TABS: ORAL_TABLET | ORAL | 1 refills | Status: DC

## 2022-09-26 MED ORDER — VALBENAZINE TOSYLATE 40 MG PO CAPS: 40.0000 mg | ORAL_CAPSULE | Freq: Every day | ORAL | 1 refills | Status: DC

## 2022-09-26 NOTE — Progress Notes (Signed)
[facility]  Patient ID: Megan Duncan,  MRN: [id number]  PCP: Ann Held, DO  Date of Evaluation: 09/26/2022  Time spent:40 minutes  Chief Complaint:  Chief Complaint   Anxiety; Depression; Follow-up    Virtual Visit via Telehealth  I connected with patient by a video enabled telemedicine application with their informed consent, and verified patient privacy and that I am speaking with the correct person using two identifiers.  I am private, in my office and the patient is at home.  I discussed the limitations, risks, security and privacy concerns of performing an evaluation and management service by video and the availability of in person appointments. I also discussed with the patient that there may be a patient responsible charge related to this service. The patient expressed understanding and agreed to proceed.   I discussed the assessment and treatment plan with the patient. The patient was provided an opportunity to ask questions and all were answered. The patient agreed with the plan and demonstrated an understanding of the instructions.   The patient was advised to call back or seek an in-person evaluation if the symptoms worsen or if the condition fails to improve as anticipated.  I provided 40 minutes of non-face-to-face time during this encounter.  HISTORY/CURRENT STATUS: HPI For routine follow-up.  Daughter [name], husband [name] are on video call.  Megan Duncan feels that she is in a pretty good place emotionally.  She had 2 days last week when she got really anxious and states Megan Duncan did not give her an extra 1/2 pill of the Ativan like he could have.  The anxiety came out of the blue.  States it finally went away.  Has not been having a lot of anxiety during the day, but does routinely take the Ativan at night which we have discussed, and is helpful to her.  Her main problem now is her jaw shakes uncontrollably.  She has started biting the insides of  her cheeks.  She is able to eat without difficulty.  She is not having any truncal movements abnormal movements of hands or legs, no tremor or tics.  We have talked about adding either Ingrezza or Austedo for tardive dyskinesia but she nor her family wanted her to take another medication.  At this time she feels like it is necessary because it is bothering her a lot.  No longer having that internal sense of jitteriness that seemed to be akathisia.  Since being on mirtazapine it has not been a problem.  She still does not do much due to the chronic back pain.  Stays in her room in bed a lot.  No extreme sadness, tearfulness, or feelings of hopelessness.  Sleeps well most of the time, and does not sleep throughout the day.  No nightmares reported.  ADLs are limited because of the chronic pain.  Personal hygiene is normal.  No change in memory.  Appetite has not changed.  Weight is stable.  Denies suicidal or homicidal thoughts.  Patient denies increased energy with decreased need for sleep, increased talkativeness, racing thoughts, impulsivity or risky behaviors, increased spending, increased libido, grandiosity, increased irritability or anger, paranoia, or hallucinations.  Review of Systems  Constitutional:  Positive for malaise/fatigue.  HENT: Negative.    Eyes: Negative.   Respiratory: Negative.    Cardiovascular: Negative.   Gastrointestinal: Negative.   Genitourinary: Negative.   Musculoskeletal:  Positive for back pain.  Skin: Negative.   Neurological:  Uncontrollable tremor of her chin, is biting her buccal mucosa at times  Endo/Heme/Allergies: Negative.   Psychiatric/Behavioral:         See HPI   Individual Medical History/ Review of Systems: Changes? :No         Past medications for mental health diagnoses include: Trileptal Lamictal Prozac Zoloft Effexor XR, Cymbalta, Paxil, lithium, Depakote, Latuda, Risperdal, Tegretol, Wellbutrin, propranolol for tremor, Thorazine  Haldol  and Zyprexa given for a few days inpatient January 2023, propranolol for lithium tremor  Hospitalized in January 2023 at [facility] for altered mental status with hallucinations, paranoia, and aggressive behavior  Allergies: Atorvastatin, Codeine, Diclofenac, Doxycycline, Fluconazole, and Sulfonamide derivatives  Current Medications:  Current Outpatient Medications:    Aspirin-Caffeine (BAYER BACK & BODY) 500-32.5 MG TABS, Take 1 tablet by mouth daily as needed. Reports takes 1-2 as needed, Disp: , Rfl:    blood glucose meter kit and supplies, Dispense based on patient and insurance preference. Use once a day.  DX Code: E11.9, Disp: 1 each, Rfl: 0   Clotrimazole 1 % LOTN, Apply bid prn, Disp: 30 mL, Rfl: 2   clotrimazole-betamethasone (LOTRISONE) cream, Apply 1 Application topically 2 (two) times daily., Disp: 30 g, Rfl: 0   dicyclomine (BENTYL) 10 MG capsule, TAKE 1 CAPSULE (10 MG TOTAL) BY MOUTH 4 TIMES A DAY BEFORE MEALS AND AT BEDTIME, Disp: 360 capsule, Rfl: 0   donepezil (ARICEPT) 5 MG tablet, Take 1 tablet (5 mg total) by mouth at bedtime., Disp: 90 tablet, Rfl: 1   DULoxetine (CYMBALTA) 20 MG capsule, TAKE 1 CAPSULE (20 MG TOTAL) BY MOUTH DAILY. TAKE WITH 60MG TO EQUAL 80MG DAILY, Disp: 90 capsule, Rfl: 1   DULoxetine (CYMBALTA) 60 MG capsule, Take 1 capsule (60 mg total) by mouth daily. With 20 mg=80 mg., Disp: 90 capsule, Rfl: 1   glucose blood test strip, Use as instructed once a day.  Dx Code: E11.9, Disp: 30 each, Rfl: 2   haloperidol (HALDOL) 1 MG tablet, 1 po q am, 2 po qhs., Disp: 90 tablet, Rfl: 5   hydrocortisone-pramoxine (ANALPRAM HC) 2.5-1 % rectal cream, Place 1 application rectally 3 (three) times daily., Disp: 30 g, Rfl: 3   Lancets MISC, Use as directed once a day.  Dx code: E11.9, Disp: 30 each, Rfl: 2   levothyroxine (SYNTHROID) 50 MCG tablet, TAKE 1 TABLET BY MOUTH EVERY DAY BEFORE BREAKFAST, Disp: 90 tablet, Rfl: 1   lidocaine (LIDODERM) 5 %,  Place 1 patch onto the skin daily as needed. Remove & Discard patch within 12 hours or as directed by MD, Disp: 30 patch, Rfl: 0   LORazepam (ATIVAN) 1 MG tablet, Take 1/2 tab daily as needed and 1 tab at bedtime., Disp: 45 tablet, Rfl: 5   metFORMIN (GLUCOPHAGE) 1000 MG tablet, TAKE 1 TABLET (1,000 MG TOTAL) BY MOUTH TWICE A DAY WITH FOOD, Disp: 180 tablet, Rfl: 1   prazosin (MINIPRESS) 2 MG capsule, Take 1 capsule (2 mg total) by mouth at bedtime., Disp: 90 capsule, Rfl: 1   QUEtiapine (SEROQUEL) 200 MG tablet, Take 1 tablet (200 mg total) by mouth at bedtime., Disp: 90 tablet, Rfl: 1   valbenazine (INGREZZA) 40 MG capsule, Take 1 capsule (40 mg total) by mouth daily., Disp: 30 capsule, Rfl: 1   famotidine (PEPCID) 20 MG tablet, Take 1 tablet (20 mg total) by mouth 2 (two) times daily. (Patient not taking: Reported on 09/26/2022), Disp: 60 tablet, Rfl: 2   metoCLOPramide (REGLAN) 10  MG tablet, Take 1 tablet (10 mg total) by mouth every 8 (eight) hours as needed for nausea. (Patient not taking: Reported on 09/26/2022), Disp: 15 tablet, Rfl: 0   mirtazapine (REMERON) 7.5 MG tablet, TAKE 1 TABLETS BY MOUTH AT BEDTIME DAILY., Disp: 180 tablet, Rfl: 1   ondansetron (ZOFRAN) 4 MG tablet, Take 1 tablet (4 mg total) by mouth every 8 (eight) hours as needed for nausea or vomiting. (Patient not taking: Reported on 09/26/2022), Disp: 20 tablet, Rfl: 0   oxyCODONE (ROXICODONE) 5 MG immediate release tablet, Take 1 tablet (5 mg total) by mouth every 4 (four) hours as needed for severe pain. (Patient not taking: Reported on 08/15/2022), Disp: 15 tablet, Rfl: 0   triamcinolone cream (KENALOG) 0.1 %, Apply 1 Application topically 2 (two) times daily. (Patient not taking: Reported on 09/26/2022), Disp: 30 g, Rfl: 0 Medication Side Effects: none  Family Medical/ Social History: Changes? No  MENTAL HEALTH EXAM:  There were no vitals taken for this visit.There is no height or weight on file to calculate BMI.   General Appearance: Casual and in pajamas in bed  Eye Contact:  Good  Speech:  Clear and Coherent and Normal Rate  Volume:  Normal  Mood:  Euthymic  Affect:  Congruent  Thought Process:  Goal Directed and Descriptions of Associations: Circumstantial  Orientation:  Full (Time, Place, and Person)  Thought Content: Logical   Suicidal Thoughts:  No  Homicidal Thoughts:  No  Memory:  Immediate;   Poor Recent;   Poor Remote;   Good  Judgement:  Good  Insight:  Good  Psychomotor Activity:  Abnormal mandibular motion up and down, worse with distraction.  No tongue protrusion or truncal movements noted.  No tremor of hands that I can tell.   Concentration:  Concentration: Fair and Attention Span: Good  Recall:  Good  Fund of Knowledge: Good  Language: Good  Assets:  Desire for Improvement  ADL's:  Intact  Cognition: WNL  Prognosis:  Good   DIAGNOSES:    ICD-10-CM   1. Bipolar II disorder ([place])  F31.81     2. Tardive dyskinesia  G24.01     3. Generalized anxiety disorder  F41.1     4. 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 PTSD (post-traumatic stress disorder)  F43.10     6. Depression, unspecified depression type  F32.A       Receiving Psychotherapy: No   RECOMMENDATIONS:  PDMP reviewed.  Ativan filled 09/21/2022. I provided 40 minutes of non-face-to-face time during this encounter, including time spent before and after the visit in records review, medical decision making, counseling pertinent to today's visit, and charting.   We discussed the tardive dyskinesia.  It can be irreversible if not treated.  I recommend adding Ingrezza or Austedo.  I will choose Ingrezza for ease of use.  Benefits, risk and side effects were discussed and patient and her family accepted.  They understand that this will likely need a prior authorization or even patient assistance, I will discuss with Dory Larsen, LPN who handles this.  Another option would be to decrease the dose of either the Haldol or  Seroquel which may or may not help, but when that was tried after her hospitalization earlier this year she became psychotic again.  Megan Duncan, her husband, her daughter, and I agree that is not an acceptable option.  Continue Aricept 5 mg, 1 p.o. nightly at 8 PM. Continue Cymbalta 80 mg daily. Continue Haldol 1  mg, 1 p.o. q am, and 2 po qhs. (ok to take an extra 1/2 pill in the middle of the night if she becomes really agitated.  Let me know the next day if they have to do that.) Continue Ativan 1 mg, 1 p.o. nightly but also can take 1/2-1 po during day prn.  (And an extra 1/2 pill during the night if needed.) Continue mirtazapine 7.5 mg, 1 p.o. nightly. Continue prazosin 2 mg p.o. nightly at 8 PM. Continue Seroquel 200 mg, 1 p.o. nightly at 8 PM. Start Ingrezza 40 mg, 1 p.o. daily.  Prescription sent to [facility]. Return in 4-6 weeks.  Donnal Moat, PA-C

## 2022-09-27 ENCOUNTER — Telehealth: Payer: Self-pay

## 2022-09-27 NOTE — Telephone Encounter (Signed)
Prior Authorization submitted and approved for INGREZZA 40 MG with [employer] Part D effective 09/27/2022. [facility] notified and Rx ran, her co-pay is $1900.00 so Sharyn Lull, with [facility] is going to work on a grant for her. They will contact the pt to get some information to be able to request a grant.

## 2022-09-27 NOTE — Telephone Encounter (Signed)
noted 

## 2022-10-05 ENCOUNTER — Other Ambulatory Visit: Payer: Self-pay | Admitting: Physician Assistant

## 2022-10-11 ENCOUNTER — Telehealth: Payer: Self-pay

## 2022-10-11 NOTE — Telephone Encounter (Signed)
For the record, patient's daughter, [name], is very involved in her care. Is with her at every appointment and helps with all her meds, so it might be helpful to call her with info/concerns. Thanks.

## 2022-10-11 NOTE — Telephone Encounter (Signed)
Received a phone call from [facility] [phone], regarding pt's speciality drug Ingrezza. The pharmacy keeps trying to contact pt to let her know her medication will be coming from their pharmacy and discuss how she is doing on medication, any side effects or issues regarding the medication. Unfortunately the husband answers and won't let them speak directly to patient, they feel he may not understand who they are or why they are calling. The pharmacy thought if we could reach out and explain the process when a speciality drug is prescribed it might be understood better. They just have to keep track of her progress and if things aren't helpful for her.   [facility] # is [phone], they are open M-F 8 am-6 pm. If pt is able to call back.    Shelton Silvas can you try to reach husband and pt and explain the reason for them calling. If they can contact [facility] it would be helpful.

## 2022-10-11 NOTE — Telephone Encounter (Signed)
Reviewed

## 2022-10-11 NOTE — Telephone Encounter (Signed)
Spoke with husband and let him know that pharmacy had been trying to contact them. I explained about the medication and the need to contact them in regards to shipment and F/U on response. Husband said he would have patient call them.

## 2022-10-12 NOTE — Telephone Encounter (Signed)
[facility] called again today asking for an updated medication list for pt. Will contact them with information.

## 2022-10-15 ENCOUNTER — Telehealth: Payer: Self-pay | Admitting: Family Medicine

## 2022-10-15 MED ORDER — METFORMIN HCL 1000 MG PO TABS: ORAL_TABLET | ORAL | 1 refills | Status: DC

## 2022-10-15 NOTE — Telephone Encounter (Signed)
Medication: metFORMIN (GLUCOPHAGE) 1000 MG tablet  Has the patient contacted their pharmacy? Yes.     Preferred Pharmacy:   [facility] [place], [place] - [address] [place]  [address] Phone: [phone]  Fax: [phone]

## 2022-10-15 NOTE — Telephone Encounter (Signed)
Refills sent

## 2022-10-22 ENCOUNTER — Other Ambulatory Visit: Payer: Self-pay | Admitting: Family Medicine

## 2022-10-22 DIAGNOSIS — M545 Low back pain, unspecified: Secondary | ICD-10-CM

## 2022-10-22 DIAGNOSIS — R110 Nausea: Secondary | ICD-10-CM

## 2022-11-11 ENCOUNTER — Telehealth: Payer: Self-pay | Admitting: Family Medicine

## 2022-11-11 DIAGNOSIS — U071 COVID-19: Secondary | ICD-10-CM

## 2022-11-11 MED ORDER — MOLNUPIRAVIR EUA 200MG CAPSULE: 4.0000 | ORAL_CAPSULE | Freq: Two times a day (BID) | ORAL | 0 refills | Status: AC

## 2022-11-11 NOTE — Telephone Encounter (Signed)
Called pt- she tested positive for covid  She got sick about two days ago and tested positive today   Spoke with patient and her husband.  Her symptoms are moderate, no distress.  She notes cough and laryngitis.  Temperature around 100.  Her most recent COVID-19 booster was in 2022  She does take several psychotropic medications per her mental health care provider Donnal Moat.  There appears to be a significant interaction between Paxlovid and Seroquel-we would probably want to stop Seroquel in order to use Paxlovid.  She also does have renal insufficiency.  Discussed options with patient and her husband, we decided to use molnupiravir in her particular situation instead.  I advised them that this medication may not be as effective in preventing complications from GNFAO-13, but is safer with her other current medications.  They will let me know if not improving or if getting worse, will seek care if any distress

## 2022-11-16 ENCOUNTER — Telehealth (INDEPENDENT_AMBULATORY_CARE_PROVIDER_SITE_OTHER): Payer: Medicare Other | Admitting: Physician Assistant

## 2022-11-16 ENCOUNTER — Encounter: Payer: Self-pay | Admitting: Physician Assistant

## 2022-11-16 DIAGNOSIS — F3181 Bipolar II disorder: Secondary | ICD-10-CM

## 2022-11-16 DIAGNOSIS — F411 Generalized anxiety disorder: Secondary | ICD-10-CM | POA: Diagnosis not present

## 2022-11-16 DIAGNOSIS — G2401 Drug induced subacute dyskinesia: Secondary | ICD-10-CM | POA: Diagnosis not present

## 2022-11-16 MED ORDER — AUSTEDO XR PATIENT TITRATION 6 & 12 & 24 MG PO TEPK: 1.0000 | EXTENDED_RELEASE_TABLET | Freq: Two times a day (BID) | ORAL | 0 refills | Status: DC

## 2022-11-16 NOTE — Progress Notes (Signed)
[facility]  Patient ID: Megan Duncan,  MRN: [id number]  PCP: Ann Held, DO  Date of Evaluation: 11/16/2022 Time spent:30 minutes  Chief Complaint:  Chief Complaint   Anxiety; Depression; Follow-up    Virtual Visit via Telehealth  I connected with patient by a video enabled telemedicine application with their informed consent, and verified patient privacy and that I am speaking with the correct person using two identifiers.  I am private, in my office and the patient is at home.  I discussed the limitations, risks, security and privacy concerns of performing an evaluation and management service by video and the availability of in person appointments. I also discussed with the patient that there may be a patient responsible charge related to this service. The patient expressed understanding and agreed to proceed.   I discussed the assessment and treatment plan with the patient. The patient was provided an opportunity to ask questions and all were answered. The patient agreed with the plan and demonstrated an understanding of the instructions.   The patient was advised to call back or seek an in-person evaluation if the symptoms worsen or if the condition fails to improve as anticipated.  I provided 30 minutes of non-face-to-face time during this encounter.  HISTORY/CURRENT STATUS: HPI For routine follow-up.  Daughter [name], husband [name] are on video call.  Unable tolerate Ingrezza, nausea, she d/c if after a few days, retried and cause same prob. Abnl mouth movements still there. Bites inside of cheek some, not tongue. Chin shakes all the time. No trouble eating.  Still mostly housebound.  Doesn't do much, has physical issues, chronic back pain which limits her.  Energy and motivation are fair.  No extreme sadness, tearfulness, or feelings of hopelessness.  Sleeps ok. Uses Ativan to help relax to go to sleep. personal hygiene is normal.   Denies any  changes in concentration, making decisions, or remembering things.  Appetite has not changed.  Weight is stable. Anxiety is well controlled.  Denies suicidal or homicidal thoughts.  Patient denies increased energy with decreased need for sleep, increased talkativeness, racing thoughts, impulsivity or risky behaviors, increased spending, increased libido, grandiosity, increased irritability or anger, paranoia, or hallucinations.  Review of Systems  Constitutional: Negative.   HENT: Negative.    Eyes: Negative.   Respiratory: Negative.    Cardiovascular: Negative.   Gastrointestinal: Negative.   Genitourinary: Negative.   Musculoskeletal:        Chronic lumbar pain.   Skin: Negative.   Neurological:        See HPI  Endo/Heme/Allergies: Negative.   Psychiatric/Behavioral:         See HPI   Individual Medical History/ Review of Systems: Changes? :No         Past medications for mental health diagnoses include: Trileptal Lamictal Prozac Zoloft Effexor XR, Cymbalta, Paxil, lithium, Depakote, Latuda, Risperdal, Tegretol, Wellbutrin, propranolol for tremor, Thorazine  Haldol and Zyprexa given for a few days inpatient January 2023, propranolol for lithium tremor, Ingrezza caused nausea  Hospitalized in January 2023 at [facility] for altered mental status with hallucinations, paranoia, and aggressive behavior  Allergies: Atorvastatin, Codeine, Diclofenac, Doxycycline, Fluconazole, and Sulfonamide derivatives  Current Medications:  Current Outpatient Medications:    Aspirin-Caffeine (BAYER BACK & BODY) 500-32.5 MG TABS, Take 1 tablet by mouth daily as needed. Reports takes 1-2 as needed, Disp: , Rfl:    blood glucose meter kit and supplies, Dispense based on patient and insurance preference.  Use once a day.  DX Code: E11.9, Disp: 1 each, Rfl: 0   Clotrimazole 1 % LOTN, Apply bid prn, Disp: 30 mL, Rfl: 2   clotrimazole-betamethasone (LOTRISONE) cream, Apply 1  Application topically 2 (two) times daily., Disp: 30 g, Rfl: 0   Deutetrabenazine (AUSTEDO XR PATIENT TITRATION) 6 & 12 & 24 MG TEPK, Take 1 Package by mouth 2 (two) times daily., Disp: 1 each, Rfl: 0   dicyclomine (BENTYL) 10 MG capsule, TAKE 1 CAPSULE (10 MG TOTAL) BY MOUTH 4 TIMES A DAY BEFORE MEALS AND AT BEDTIME, Disp: 360 capsule, Rfl: 0   donepezil (ARICEPT) 5 MG tablet, Take 1 tablet (5 mg total) by mouth at bedtime., Disp: 90 tablet, Rfl: 1   DULoxetine (CYMBALTA) 20 MG capsule, TAKE 1 CAPSULE (20 MG TOTAL) BY MOUTH DAILY. TAKE WITH '60MG'$  TO EQUAL '80MG'$  DAILY, Disp: 90 capsule, Rfl: 1   DULoxetine (CYMBALTA) 60 MG capsule, Take 1 capsule (60 mg total) by mouth daily. With 20 mg=80 mg., Disp: 90 capsule, Rfl: 1   famotidine (PEPCID) 20 MG tablet, Take 1 tablet (20 mg total) by mouth 2 (two) times daily., Disp: 180 tablet, Rfl: 0   glucose blood test strip, Use as instructed once a day.  Dx Code: E11.9, Disp: 30 each, Rfl: 2   haloperidol (HALDOL) 1 MG tablet, 1 po q am, 2 po qhs., Disp: 90 tablet, Rfl: 5   hydrocortisone-pramoxine (ANALPRAM HC) 2.5-1 % rectal cream, Place 1 application rectally 3 (three) times daily., Disp: 30 g, Rfl: 3   Lancets MISC, Use as directed once a day.  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 Remove & Discard patch within 12 hours or as directed by MD, Disp: 30 patch, Rfl: 0   LORazepam (ATIVAN) 1 MG tablet, Take 1/2 tab daily as needed and 1 tab at bedtime., Disp: 45 tablet, Rfl: 5   metFORMIN (GLUCOPHAGE) 1000 MG tablet, TAKE 1 TABLET (1,000 MG TOTAL) BY MOUTH TWICE A DAY WITH FOOD, Disp: 180 tablet, Rfl: 1   metoCLOPramide (REGLAN) 10 MG tablet, Take 1 tablet (10 mg total) by mouth every 8 (eight) hours as needed for nausea. (Patient not taking: Reported on 09/26/2022), Disp: 15  tablet, Rfl: 0   mirtazapine (REMERON) 7.5 MG tablet, TAKE 1 TABLETS BY MOUTH AT BEDTIME DAILY., Disp: 180 tablet, Rfl: 1   ondansetron (ZOFRAN) 4 MG tablet, Take 1 tablet (4 mg total) by mouth every 8 (eight) hours as needed for nausea or vomiting. (Patient not taking: Reported on 09/26/2022), Disp: 20 tablet, Rfl: 0   prazosin (MINIPRESS) 2 MG capsule, Take 1 capsule (2 mg total) by mouth at bedtime., Disp: 90 capsule, Rfl: 1   QUEtiapine (SEROQUEL) 200 MG tablet, TAKE 1 TABLET BY MOUTH EVERYDAY AT BEDTIME, Disp: 90 tablet, Rfl: 0   triamcinolone cream (KENALOG) 0.1 %, Apply 1 Application topically 2 (two) times daily. (Patient not taking: Reported on 09/26/2022), Disp: 30 g, Rfl: 0 Medication Side Effects: nausea  Family Medical/ Social History: Changes? No  MENTAL HEALTH EXAM:  There were no vitals taken for this visit.There is no height or weight on file to calculate BMI.  General Appearance: Casual and in pajamas in bed  Eye Contact:  Good  Speech:  Clear and Coherent and Normal Rate  Volume:  Normal  Mood:  Euthymic  Affect:  Congruent  Thought Process:  Goal Directed and Descriptions of Associations: Circumstantial  Orientation:  Full (Time, Place, and Person)  Thought Content: Logical   Suicidal Thoughts:  No  Homicidal Thoughts:  No  Memory:  Immediate;   Poor Recent;   Poor Remote;   Good  Judgement:  Good  Insight:  Good  Psychomotor Activity:  Abnormal mandibular motion up and down, worse with distraction.  No tongue protrusion or truncal movements noted.  No tremor of hands that I can tell.   Concentration:  Concentration: Fair and Attention Span: Good  Recall:  Good  Fund of Knowledge: Good  Language: Good  Assets:  Desire for Improvement Financial Resources/Insurance Housing Social Support Transportation  ADL's:  Intact  Cognition: WNL  Prognosis:  Good   DIAGNOSES:    ICD-10-CM   1. Bipolar II disorder ([place])  F31.81     2. Tardive dyskinesia  G24.01      3. Generalized anxiety disorder  F41.1      Receiving Psychotherapy: No   RECOMMENDATIONS:  PDMP reviewed.  Ativan filled 11/13/2022 I provided 30 minutes of non-face to face time during this encounter, including time spent before and after the visit in records review, medical decision making, counseling pertinent to today's visit, and charting.   Discussed TD, will change to Austedo, hopefully that won't cause any SE.   Start Austedo XR titration pack sent in.  Continue Aricept 5 mg, 1 p.o. nightly at 8 PM. Continue Cymbalta 80 mg daily. Continue Haldol 1 mg, 1 p.o. q am, and 2 po qhs. (ok to take an extra 1/2 pill in the middle of the night if she becomes really agitated.  Let me know the next day if they have to do that.) Continue Ativan 1 mg, 1 p.o. nightly but also can take 1/2-1 po during day prn.  (And an extra 1/2 pill during the night if needed.) Continue mirtazapine 7.5 mg, 1 p.o. nightly. Continue prazosin 2 mg p.o. nightly at 8 PM. Continue Seroquel 200 mg, 1 p.o. nightly at 8 PM. Return in 6-8 weeks.  Donnal Moat, PA-C

## 2022-11-22 ENCOUNTER — Telehealth: Payer: Self-pay | Admitting: Family Medicine

## 2022-11-22 MED ORDER — LEVOTHYROXINE SODIUM 50 MCG PO TABS: ORAL_TABLET | ORAL | 1 refills | Status: DC

## 2022-11-22 NOTE — Telephone Encounter (Signed)
Medication: levothyroxine (SYNTHROID) 50 MCG tablet  Has the patient contacted their pharmacy? Yes.     [facility] [place] [place] [facility] [address], [place] [address] Phone: [phone]  Fax: [phone]

## 2022-11-22 NOTE — Telephone Encounter (Signed)
Refills sent

## 2022-12-04 ENCOUNTER — Telehealth: Payer: Self-pay | Admitting: Physician Assistant

## 2022-12-04 NOTE — Telephone Encounter (Signed)
[name] Megan Duncan's daughter called and said that she was following up on any new meds for td. Also she wanted teresa to know that Megan Duncan is really struggling with restless legs.. Please give julia a call at [phone]

## 2022-12-04 NOTE — Telephone Encounter (Signed)
Megan Duncan requesting a call from you ,I informed her she can reach out on my chart as well.She is checking on if there are any new meds for TD.She just stated she is struggling with restless legs more lately,and also some anxiety.

## 2022-12-05 ENCOUNTER — Encounter: Payer: Self-pay | Admitting: Physician Assistant

## 2022-12-05 ENCOUNTER — Other Ambulatory Visit: Payer: Self-pay | Admitting: Physician Assistant

## 2022-12-05 NOTE — Telephone Encounter (Signed)
I sent [name] a message via My chart

## 2022-12-12 ENCOUNTER — Telehealth: Payer: Self-pay | Admitting: Family Medicine

## 2022-12-12 NOTE — Telephone Encounter (Signed)
Copied from [place] [id number]. Topic: Medicare AWV >> Dec 12, 2022 11:55 AM Devoria Glassing wrote: Reason for CRM: Left message for patient daughter [name] to schedule Annual Wellness Visit(AWV).  Please schedule with Health Nurse Advisor at [facility]. Please call [phone] ask for [facility].

## 2022-12-14 ENCOUNTER — Other Ambulatory Visit: Payer: Self-pay | Admitting: Physician Assistant

## 2022-12-14 MED ORDER — AUSTEDO PATIENT TITRATION KIT 6 & 9 & 12 MG PO TBPK: 1.0000 | ORAL_TABLET | Freq: Every day | ORAL | 0 refills | Status: DC

## 2022-12-16 ENCOUNTER — Other Ambulatory Visit: Payer: Self-pay | Admitting: Family Medicine

## 2022-12-21 ENCOUNTER — Telehealth: Payer: Self-pay | Admitting: Family Medicine

## 2022-12-21 NOTE — Telephone Encounter (Signed)
Patient called to advise that she has been dealing with pain in her hip (goes down her leg) for last 2-3 weeks.She said that she has discussed with [place] but doesn't know if she has been prescribed anything for it. Patient doesn't know if she can wait until her visit at the end of next week. Please call to advise if she is able to get anything. She would like [facility] to [facility] on [facility] [address] [facility] [address] [address]

## 2022-12-21 NOTE — Telephone Encounter (Signed)
Was seen in September with back pain, I do not see that she has been prescribed pain medication.  Please arrange an appointment in this office with one  of [name] for early next week.

## 2022-12-21 NOTE — Telephone Encounter (Signed)
Pt has appointment with Lovena Le on Monday

## 2022-12-24 ENCOUNTER — Encounter: Payer: Self-pay | Admitting: Family Medicine

## 2022-12-24 ENCOUNTER — Ambulatory Visit (INDEPENDENT_AMBULATORY_CARE_PROVIDER_SITE_OTHER): Payer: Medicare Other | Admitting: Family Medicine

## 2022-12-24 ENCOUNTER — Ambulatory Visit (HOSPITAL_BASED_OUTPATIENT_CLINIC_OR_DEPARTMENT_OTHER)
Admission: RE | Admit: 2022-12-24 | Discharge: 2022-12-24 | Disposition: A | Discharge status: Home / Self Care | Payer: Medicare Other | Source: Ambulatory Visit | Attending: Family Medicine | Admitting: Family Medicine

## 2022-12-24 VITALS — BP 162/93 | HR 104 | Temp 98.1°F | Resp 16 | Ht 62.0 in | Wt 164.8 lb

## 2022-12-24 DIAGNOSIS — R110 Nausea: Secondary | ICD-10-CM

## 2022-12-24 DIAGNOSIS — E1165 Type 2 diabetes mellitus with hyperglycemia: Secondary | ICD-10-CM | POA: Diagnosis not present

## 2022-12-24 DIAGNOSIS — M5441 Lumbago with sciatica, right side: Secondary | ICD-10-CM | POA: Diagnosis not present

## 2022-12-24 DIAGNOSIS — K5900 Constipation, unspecified: Secondary | ICD-10-CM | POA: Diagnosis not present

## 2022-12-24 LAB — CBC WITH DIFFERENTIAL/PLATELET
Basophils Absolute: 0.1 10*3/uL (ref 0.0–0.1)
Basophils Relative: 0.9 % (ref 0.0–3.0)
Eosinophils Absolute: 0.1 10*3/uL (ref 0.0–0.7)
Eosinophils Relative: 1.5 % (ref 0.0–5.0)
HCT: 41.9 % (ref 36.0–46.0)
Hemoglobin: 13.8 g/dL (ref 12.0–15.0)
Lymphocytes Relative: 24.1 % (ref 12.0–46.0)
Lymphs Abs: 1.6 10*3/uL (ref 0.7–4.0)
MCHC: 33 g/dL (ref 30.0–36.0)
MCV: 95.9 fl (ref 78.0–100.0)
Monocytes Absolute: 0.6 10*3/uL (ref 0.1–1.0)
Monocytes Relative: 9.8 % (ref 3.0–12.0)
Neutro Abs: 4.1 10*3/uL (ref 1.4–7.7)
Neutrophils Relative %: 63.7 % (ref 43.0–77.0)
Platelets: 364 10*3/uL (ref 150.0–400.0)
RBC: 4.37 Mil/uL (ref 3.87–5.11)
RDW: 15 % (ref 11.5–15.5)
WBC: 6.5 10*3/uL (ref 4.0–10.5)

## 2022-12-24 LAB — COMPREHENSIVE METABOLIC PANEL
ALT: 11 U/L (ref 0–35)
AST: 17 U/L (ref 0–37)
Albumin: 4.2 g/dL (ref 3.5–5.2)
Alkaline Phosphatase: 53 U/L (ref 39–117)
BUN: 15 mg/dL (ref 6–23)
CO2: 27 mEq/L (ref 19–32)
Calcium: 10.2 mg/dL (ref 8.4–10.5)
Chloride: 104 mEq/L (ref 96–112)
Creatinine, Ser: 1.09 mg/dL (ref 0.40–1.20)
GFR: 49.3 mL/min — ABNORMAL LOW (ref >60.00)
Glucose, Bld: 116 mg/dL — ABNORMAL HIGH (ref 70–99)
Potassium: 4.5 mEq/L (ref 3.5–5.1)
Sodium: 143 mEq/L (ref 135–145)
Total Bilirubin: 0.4 mg/dL (ref 0.2–1.2)
Total Protein: 6.6 g/dL (ref 6.0–8.3)

## 2022-12-24 LAB — GLUCOSE, POCT (MANUAL RESULT ENTRY): POC Glucose: 159 mg/dl — AB (ref 70–99)

## 2022-12-24 LAB — LIPASE: Lipase: 24 U/L (ref 11.0–59.0)

## 2022-12-24 NOTE — Progress Notes (Signed)
Acute Office Visit  Subjective:     Patient ID: Megan Duncan, female    DOB: 06-Oct-1946, 77 y.o.   MRN: [id number]  Chief Complaint  Patient presents with   Nausea   nerve pain    Tailbone and down right leg    HPI Patient is in today for sciatica and ongoing nausea.   Patient states she has history of low back pain, but for the past 2-3 weeks it has been significantly worse. No known triggers. States the pain is right lower back radiating pain down right leg to the knee, consistent with history of sciatica. States pain is worse with walking, sometimes bringing her to tears (8/10). She has not gotten much improvement with aspirin or Voltaren gel. She just signed up to start getting massages. Reports she has been told her back is in poor shape, but not a candidate for surgery, she is planning to reach out to previous spine doctor to see if she is a candidate for any injections or other treatment. She denies any incontinence or saddle paresthesias or urinary problems. She is willing to go back to physical therapy.   Additionally she has been struggling with regular nausea for months. Reports she had ED visits for this last fall and nothing was found. States she does have occasional constipation, but reports a normal bowel movement yesterday. She does take occasional Zofran. She has not had any vomiting, abdominal pain, or fevers.   States she has a routine follow-up with PCP later this week.     ROS All review of systems negative except what is listed in the HPI      Objective:    BP (!) 162/93   Pulse (!) 104   Temp 98.1 F (36.7 C)   Resp 16   Ht 5' 2"$  (1.575 m)   Wt 164 lb 12.8 oz (74.8 kg)   SpO2 94%   BMI 30.14 kg/m    Physical Exam Vitals reviewed.  Constitutional:      Appearance: Normal appearance.  Cardiovascular:     Rate and Rhythm: Normal rate and regular rhythm.     Pulses: Normal pulses.     Heart sounds: Normal heart sounds.  Pulmonary:      Effort: Pulmonary effort is normal.     Breath sounds: Normal breath sounds.  Abdominal:     General: Bowel sounds are normal. There is no distension.     Palpations: Abdomen is soft.     Tenderness: There is no abdominal tenderness. There is no guarding.  Skin:    General: Skin is warm and dry.  Neurological:     Mental Status: She is alert and oriented to person, place, and time.  Psychiatric:        Mood and Affect: Mood normal.        Behavior: Behavior normal.        Thought Content: Thought content normal.        Judgment: Judgment normal.     Results for orders placed or performed in visit on 12/24/22  POCT Glucose (CBG)  Result Value Ref Range   POC Glucose 159 (A) 70 - 99 mg/dl        Assessment & Plan:   Problem List Items Addressed This Visit       Endocrine   Type II diabetes mellitus Patient requesting POCT glucose for nausea today. Mildly elevated, but not fasting.    Relevant Orders   POCT Glucose (CBG) (Completed)  Other Visit Diagnoses     Nausea    -  Primary - Updating labs and abdominal xray to make sure no signs of constipation. If unremarkable workup, discuss with PCP at next visit later this week to see if GI referral is warranted    Relevant Orders   DG Abd 1 View   CBC with Differential/Platelet   Comprehensive metabolic panel   Lipase   Acute right-sided low back pain with right-sided sciatica     - Referral for physical therapy - BP is quite high today, so I prefer not to start prednisone or NSAIDs until better controlled. For now, recommend Tylenol arthritis strength for your pain. Can adjust at next appointment if BP is improved.  - Schedule a follow-up with your previous spine clinic - Try, rest, heat massage, home exercises - handout provided    Relevant Orders   Ambulatory referral to Physical Therapy       No orders of the defined types were placed in this encounter.   Return for - keep routine f/u with PCP later this week  .  Terrilyn Saver, NP

## 2022-12-24 NOTE — Patient Instructions (Addendum)
Nausea: - Updating labs and abdominal xray to make sure no signs of constipation. If unremarkable workup, discuss with PCP at next visit later this week to see if GI referral is warranted  Low back pain with sciatica: - Referral for physical therapy - BP is quite high today, so I prefer not to start prednisone or NSAIDs until better controlled. For now, recommend Tylenol arthritis strength for your pain. Can adjust at next appointment if BP is improved.  - Schedule a follow-up with your previous spine clinic - Try, rest, heat massage, home exercises - handout provided

## 2022-12-27 ENCOUNTER — Encounter: Payer: Self-pay | Admitting: Family Medicine

## 2022-12-27 ENCOUNTER — Ambulatory Visit (INDEPENDENT_AMBULATORY_CARE_PROVIDER_SITE_OTHER): Payer: Medicare Other | Admitting: Family Medicine

## 2022-12-27 VITALS — BP 146/100 | HR 97 | Temp 98.0°F | Resp 12 | Ht 62.0 in | Wt 164.0 lb

## 2022-12-27 DIAGNOSIS — E039 Hypothyroidism, unspecified: Secondary | ICD-10-CM | POA: Diagnosis not present

## 2022-12-27 DIAGNOSIS — R110 Nausea: Secondary | ICD-10-CM | POA: Insufficient documentation

## 2022-12-27 DIAGNOSIS — E1165 Type 2 diabetes mellitus with hyperglycemia: Secondary | ICD-10-CM | POA: Diagnosis not present

## 2022-12-27 DIAGNOSIS — M5416 Radiculopathy, lumbar region: Secondary | ICD-10-CM | POA: Diagnosis not present

## 2022-12-27 MED ORDER — ONDANSETRON HCL 4 MG PO TABS: 4.0000 mg | ORAL_TABLET | Freq: Three times a day (TID) | ORAL | 0 refills | Status: DC | PRN

## 2022-12-27 MED ORDER — OMEPRAZOLE 20 MG PO CPDR: 20.0000 mg | DELAYED_RELEASE_CAPSULE | Freq: Every day | ORAL | 3 refills | Status: DC

## 2022-12-27 MED ORDER — METFORMIN HCL 1000 MG PO TABS: ORAL_TABLET | ORAL | 1 refills | Status: DC

## 2022-12-27 NOTE — Progress Notes (Signed)
Subjective:   By signing my name below, I, [name], attest that this documentation has been prepared under the direction and in the presence of Ann Held, DO. 12/27/2022   Patient ID: Megan Duncan, female    DOB: 03-08-1946, 77 y.o.   MRN: [id number]  Chief Complaint  Patient presents with   Nausea    Has started back dicyclomine    dry cough   Follow-up         HPI Patient is in today for a follow up visit. She is present with her daughter during this visit.   She complains of constant nausea for the past 3 weeks. She is keeping her regular appetite. She is also keeping up with her fluid intake. She has not vomited. She is taking 10 mg Bentyl to manage her symptoms. She is also taking OTC nauzene and finds occasional relief.  She also has frequent dry cough.  She also complains of right hip pain that radiates down the back of her thigh and ends before her knee. She thinks it may be sciatica pain. She seen another provider for her pain and was referred to physical therapy. She is taking aspirin to manage her pain and found mild relief. She stopped taking it per her other providers instructions and started taking tylenol. She has seen a spine specialist in the past and found she had stenosis in the spine.  She continues taking 1 mg Haldol to help her sleep and finds it effective. She typically wakes up around 5 am due to coughing.  She is requesting to reduce her 1000 mg metformin 2x daily PO dosage. Her blood sugar levels are stable at home.  Lab Results  Component Value Date   HGBA1C 5.8 04/23/2022    Past Medical History:  Diagnosis Date   Acute pain of right shoulder 09/24/2018   Acute upper respiratory infection 09/19/2010   Allergic rhinitis 10/03/2016   Anterolisthesis of lumbar spine    Arthritis    hands   Bilateral hip pain 09/02/2014   Bipolar II disorder    Cellulitis and abscess of trunk 09/15/2009   Ceruminosis 03/04/2014   Cervicalgia  06/20/2007   Chronic low back pain 06/20/2007   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-sided sciatica 08/06/2012   COPD (chronic obstructive pulmonary disease) ([place])    Deformity of finger 07/15/2014   Degeneration of lumbar intervertebral disc 08/22/2020   Degenerative lumbar spinal stenosis    Dermatitis [id number]   Diastolic dysfunction    Per pt, diagnosed after [facility]   Diverticulosis    DOE (dyspnea on exertion) 07/20/2016   Essential (primary) hypertension 04/23/2007   Estrogen deficiency 09/01/2017   External hemorrhoid 08/01/2015   Fibromyalgia    Foraminal stenosis of lumbar region 08/22/2020   Generalized anxiety disorder 09/01/2018   Hyperlipidemia    Inflammatory disease of uterus 04/23/2007   Left flank pain 07/13/2021   Left upper quadrant pain 09/01/2017   Lower abdominal pain 08/06/2012   Lumbar back pain with radiculopathy affecting left lower extremity 01/24/2010   Lumbar radiculopathy 01/26/2021   Lump in upper inner quadrant of left breast 07/13/2021   Major depressive disorder    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   Otitis media 09/05/2010   Palpitations 07/20/2016   Peripheral neuropathy 02/22/2010   Retrolisthesis of vertebrae 08/22/2020   Shingles outbreak 04/14/2015   Sinusitis, acute maxillary 09/14/2013   Stress incontinence, female 08/01/2015   Thoracic aortic  atherosclerosis 05/14/2017   Tinea corporis 09/01/2017   Tinnitus 03/04/2014   Torticollis 03/04/2014   Type II diabetes mellitus 06/05/2018    Past Surgical History:  Procedure Laterality Date   APPENDECTOMY     EYE SURGERY     KNEE ARTHROSCOPY Right 11/12/2006   TONSILLECTOMY     TOTAL ABDOMINAL HYSTERECTOMY      Family History  Problem Relation Age of Onset   Hypertension Mother    Hiatal hernia Mother    Diabetes Father    Hypertension Father    Heart attack Father    Alcoholism Father    Bipolar disorder Father    Mental illness Sister     Suicidality Sister    Colon cancer Maternal Aunt 40   Breast cancer Maternal Aunt    Bipolar disorder Paternal Aunt     Social History   Socioeconomic History   Marital status: Married    Spouse name: Shalicia Pyron   Number of children: 3   Years of education: 14   Highest education level: Some college, no degree  Occupational History   Occupation: retired  Tobacco Use   Smoking status: Former    Packs/day: 1.50    Years: 40.00    Total pack years: 60.00    Types: Cigarettes    Quit date: 07/02/2004    Years since quitting: 18.4    Passive exposure: Past   Smokeless tobacco: Never   Tobacco comments:    Verified by Aldona Lento (Husband), & Lauralee Evener (Daughter)  Vaping Use   Vaping Use: Never used  Substance and Sexual Activity   Alcohol use: Yes    Alcohol/week: 0.0 - 1.0 standard drinks of alcohol    Comment: occasional wine   Drug use: No   Sexual activity: Never  Other Topics Concern   Not on file  Social History Narrative   Lives with husband and grandson she is raising.   11/07/21 Lives with husband   Social Determinants of Health   Financial Resource Strain: Medium Risk (11/29/2021)   Overall Financial Resource Strain (CARDIA)    Difficulty of Paying Living Expenses: Somewhat hard  Food Insecurity: No Food Insecurity (11/29/2021)   Hunger Vital Sign    Worried About Running Out of Food in the Last Year: Never true    [place] in the Last Year: Never true  Transportation Needs: No Transportation Needs (11/29/2021)   PRAPARE - Hydrologist (Medical): No    Lack of Transportation (Non-Medical): No  Physical Activity: Inactive (11/29/2021)   Exercise Vital Sign    Days of Exercise per Week: 0 days    Minutes of Exercise per Session: 0 min  Stress: No Stress Concern Present (11/29/2021)   [place]    Feeling of Stress : Not at all  Social  Connections: [place] (11/29/2021)   Social Connection and Isolation Panel [NHANES]    Frequency of Communication with Friends and Family: More than three times a week    Frequency of Social Gatherings with Friends and Family: More than three times a week    Attends Religious Services: 1 to 4 times per year    Active Member of [employer] or Organizations: Yes    Attends Archivist Meetings: More than 4 times per year    Marital Status: Married  Human resources officer Violence: Not At Risk (11/29/2021)   Humiliation, Afraid, Rape, and Kick questionnaire  Fear of Current or Ex-Partner: No    Emotionally Abused: No    Physically Abused: No    Sexually Abused: No    Outpatient Medications Prior to Visit  Medication Sig Dispense Refill   acetaminophen (TYLENOL) 650 MG CR tablet Take 650 mg by mouth every 8 (eight) hours as needed for pain.     Deutetrabenazine ([place] PATIENT TITRATION KIT) 6 & 9 & 12 MG TBPK Take 1 kit by mouth daily. 1 each 0   dicyclomine (BENTYL) 10 MG capsule TAKE 1 CAPSULE (10 MG TOTAL) BY MOUTH 4 TIMES A DAY BEFORE MEALS AND AT BEDTIME 360 capsule 0   donepezil (ARICEPT) 5 MG tablet Take 1 tablet (5 mg total) by mouth at bedtime. 90 tablet 1   DULoxetine (CYMBALTA) 20 MG capsule TAKE 1 CAPSULE (20 MG TOTAL) BY MOUTH DAILY. TAKE WITH 60MG TO EQUAL 80MG DAILY 90 capsule 1   DULoxetine (CYMBALTA) 60 MG capsule Take 1 capsule (60 mg total) by mouth daily. With 20 mg=80 mg. 90 capsule 1   haloperidol (HALDOL) 1 MG tablet 1 po q am, 2 po qhs. 90 tablet 5   levothyroxine (SYNTHROID) 50 MCG tablet TAKE 1 TABLET BY MOUTH EVERY DAY BEFORE BREAKFAST 90 tablet 1   LORazepam (ATIVAN) 1 MG tablet Take 1/2 tab daily as needed and 1 tab at bedtime. 45 tablet 5   mirtazapine (REMERON) 7.5 MG tablet TAKE 1 TABLETS BY MOUTH AT BEDTIME DAILY. 180 tablet 1   prazosin (MINIPRESS) 2 MG capsule Take 1 capsule (2 mg total) by mouth at bedtime. 90 capsule 1   QUEtiapine (SEROQUEL)  200 MG tablet TAKE 1 TABLET BY MOUTH EVERYDAY AT BEDTIME 90 tablet 0   metFORMIN (GLUCOPHAGE) 1000 MG tablet TAKE 1 TABLET (1,000 MG TOTAL) BY MOUTH TWICE A DAY WITH FOOD 180 tablet 1   Aspirin-Caffeine (BAYER BACK & BODY) 500-32.5 MG TABS Take 1 tablet by mouth daily as needed. Reports takes 1-2 as needed (Patient not taking: Reported on 12/27/2022)     hydrocortisone-pramoxine (ANALPRAM HC) 2.5-1 % rectal cream Place 1 application rectally 3 (three) times daily. 30 g 3   Lancets MISC Use as directed once a day.  Dx code: E11.9 30 each 2   lidocaine (LIDODERM) 5 % Place 1 patch onto the skin daily as needed. Remove & Discard patch within 12 hours or as directed by MD 30 patch 0   blood glucose meter kit and supplies Dispense based on patient and insurance preference. Use once a day.  DX Code: E11.9 1 each 0   Clotrimazole 1 % LOTN Apply bid prn 30 mL 2   clotrimazole-betamethasone (LOTRISONE) cream Apply 1 Application topically 2 (two) times daily. 30 g 0   famotidine (PEPCID) 20 MG tablet Take 1 tablet (20 mg total) by mouth 2 (two) times daily. 180 tablet 0   glucose blood test strip Use as instructed once a day.  Dx Code: E11.9 30 each 2   metoCLOPramide (REGLAN) 10 MG tablet Take 1 tablet (10 mg total) by mouth every 8 (eight) hours as needed for nausea. 15 tablet 0   ondansetron (ZOFRAN) 4 MG tablet Take 1 tablet (4 mg total) by mouth every 8 (eight) hours as needed for nausea or vomiting. 20 tablet 0   triamcinolone cream (KENALOG) 0.1 % Apply 1 Application topically 2 (two) times daily. 30 g 0   No facility-administered medications prior to visit.    Allergies  Allergen Reactions   Atorvastatin Other (  See Comments)    Significant rise in liver tests   Codeine Nausea And Vomiting   Diclofenac Other (See Comments)    Elevated LFT's   Doxycycline     Extreme nausea   Fluconazole Other (See Comments)    Hallucinations    Sulfonamide Derivatives Swelling    Review of Systems   Constitutional:  Negative for fever and malaise/fatigue.  HENT:  Negative for congestion.   Eyes:  Negative for blurred vision.  Respiratory:  Positive for cough (dry cough). Negative for shortness of breath.   Cardiovascular:  Negative for chest pain, palpitations and leg swelling.  Gastrointestinal:  Positive for nausea. Negative for abdominal pain, blood in stool and vomiting.  Genitourinary:  Negative for dysuria and frequency.  Musculoskeletal:  Negative for falls.       (+)Right hip pain that radiates down right leg  Skin:  Negative for rash.  Neurological:  Negative for dizziness, loss of consciousness and headaches.  Endo/Heme/Allergies:  Negative for environmental allergies.  Psychiatric/Behavioral:  Negative for depression. The patient is not nervous/anxious.        Objective:    Physical Exam Vitals and nursing note reviewed.  Constitutional:      General: She is not in acute distress.    Appearance: Normal appearance. She is not ill-appearing.  HENT:     Head: Normocephalic and atraumatic.     Right Ear: External ear normal.     Left Ear: External ear normal.  Eyes:     Extraocular Movements: Extraocular movements intact.     Pupils: Pupils are equal, round, and reactive to light.  Cardiovascular:     Rate and Rhythm: Normal rate and regular rhythm.     Heart sounds: Normal heart sounds. No murmur heard.    No gallop.  Pulmonary:     Effort: Pulmonary effort is normal. No respiratory distress.     Breath sounds: Normal breath sounds. No wheezing or rales.  Skin:    General: Skin is warm and dry.  Neurological:     Mental Status: She is alert and oriented to person, place, and time.  Psychiatric:        Judgment: Judgment normal.     BP (!) 146/100 (BP Location: Left Arm, Cuff Size: Normal)   Pulse 97   Temp 98 F (36.7 C) (Oral)   Resp 12   Ht 5' 2"$  (1.575 m)   Wt 164 lb (74.4 kg)   SpO2 92%   BMI 30.00 kg/m  Wt Readings from Last 3 Encounters:   12/27/22 164 lb (74.4 kg)  12/24/22 164 lb 12.8 oz (74.8 kg)  08/07/22 169 lb 3.2 oz (76.7 kg)       Assessment & Plan:  Nausea Assessment & Plan: Zofran as needed  Refer to GI  Take omeprazole qd  May be from meds ----  cut down on metformin  Discuss meds with psych as well   Orders: -     Lipid panel -     CBC with Differential/Platelet -     Comprehensive metabolic panel -     Hemoglobin A1c -     Microalbumin / creatinine urine ratio -     DG Chest 2 View; Future -     Ambulatory referral to Gastroenterology -     Omeprazole; Take 1 capsule (20 mg total) by mouth daily.  Dispense: 30 capsule; Refill: 3 -     Ondansetron HCl; Take 1 tablet (4 mg total) by  mouth every 8 (eight) hours as needed for nausea or vomiting.  Dispense: 20 tablet; Refill: 0  Type 2 diabetes mellitus with hyperglycemia, without long-term current use of insulin (HCC) -     Lipid panel -     CBC with Differential/Platelet -     Comprehensive metabolic panel -     Hemoglobin A1c -     Microalbumin / creatinine urine ratio -     DG Chest 2 View; Future -     metFORMIN HCl; 1 po qd  Dispense: 90 tablet; Refill: 1  Lumbar back pain with radiculopathy affecting left lower extremity  Hypothyroidism, unspecified type Assessment & Plan: Stable  Lab Results  Component Value Date   TSH 0.93 04/23/2022     Orders: -     TSH    I, Ann Held, DO, personally preformed the services described in this documentation.  All medical record entries made by the scribe were at my direction and in my presence.  I have reviewed the chart and discharge instructions (if applicable) and agree that the record reflects my personal performance and is accurate and complete. 12/27/2022   I,[name],acting as a scribe for Ann Held, DO.,have documented all relevant documentation on the behalf of Ann Held, DO,as directed by  Ann Held, DO while in the presence of Ann Held, DO.   Ann Held, DO

## 2022-12-27 NOTE — Assessment & Plan Note (Signed)
Stable  Lab Results  Component Value Date   TSH 0.93 04/23/2022

## 2022-12-27 NOTE — Assessment & Plan Note (Addendum)
Zofran as needed  Refer to GI  Take omeprazole qd  May be from meds ----  cut down on metformin  Discuss meds with psych as well

## 2022-12-28 ENCOUNTER — Ambulatory Visit (INDEPENDENT_AMBULATORY_CARE_PROVIDER_SITE_OTHER): Payer: Medicare Other | Admitting: Physician Assistant

## 2022-12-28 ENCOUNTER — Encounter: Payer: Self-pay | Admitting: Physician Assistant

## 2022-12-28 ENCOUNTER — Ambulatory Visit (INDEPENDENT_AMBULATORY_CARE_PROVIDER_SITE_OTHER): Payer: Medicare Other | Admitting: *Deleted

## 2022-12-28 DIAGNOSIS — M48061 Spinal stenosis, lumbar region without neurogenic claudication: Secondary | ICD-10-CM | POA: Diagnosis not present

## 2022-12-28 DIAGNOSIS — M797 Fibromyalgia: Secondary | ICD-10-CM | POA: Diagnosis not present

## 2022-12-28 DIAGNOSIS — G8929 Other chronic pain: Secondary | ICD-10-CM | POA: Diagnosis not present

## 2022-12-28 DIAGNOSIS — M5442 Lumbago with sciatica, left side: Secondary | ICD-10-CM | POA: Diagnosis not present

## 2022-12-28 DIAGNOSIS — Z0000 Encounter for general adult medical examination without abnormal findings: Secondary | ICD-10-CM

## 2022-12-28 DIAGNOSIS — F411 Generalized anxiety disorder: Secondary | ICD-10-CM

## 2022-12-28 DIAGNOSIS — Z1231 Encounter for screening mammogram for malignant neoplasm of breast: Secondary | ICD-10-CM | POA: Diagnosis not present

## 2022-12-28 DIAGNOSIS — F515 Nightmare disorder: Secondary | ICD-10-CM | POA: Diagnosis not present

## 2022-12-28 DIAGNOSIS — G2571 Drug induced akathisia: Secondary | ICD-10-CM

## 2022-12-28 DIAGNOSIS — R110 Nausea: Secondary | ICD-10-CM

## 2022-12-28 DIAGNOSIS — Z780 Asymptomatic menopausal state: Secondary | ICD-10-CM | POA: Diagnosis not present

## 2022-12-28 DIAGNOSIS — F3181 Bipolar II disorder: Secondary | ICD-10-CM | POA: Diagnosis not present

## 2022-12-28 DIAGNOSIS — G2401 Drug induced subacute dyskinesia: Secondary | ICD-10-CM | POA: Diagnosis not present

## 2022-12-28 LAB — COMPREHENSIVE METABOLIC PANEL
ALT: 10 U/L (ref 0–35)
AST: 15 U/L (ref 0–37)
Albumin: 4.3 g/dL (ref 3.5–5.2)
Alkaline Phosphatase: 54 U/L (ref 39–117)
BUN: 13 mg/dL (ref 6–23)
CO2: 29 mEq/L (ref 19–32)
Calcium: 10 mg/dL (ref 8.4–10.5)
Chloride: 103 mEq/L (ref 96–112)
Creatinine, Ser: 1.1 mg/dL (ref 0.40–1.20)
GFR: 48.76 mL/min — ABNORMAL LOW (ref >60.00)
Glucose, Bld: 99 mg/dL (ref 70–99)
Potassium: 4.7 mEq/L (ref 3.5–5.1)
Sodium: 143 mEq/L (ref 135–145)
Total Bilirubin: 0.4 mg/dL (ref 0.2–1.2)
Total Protein: 6.9 g/dL (ref 6.0–8.3)

## 2022-12-28 LAB — LIPID PANEL
Cholesterol: 231 mg/dL — ABNORMAL HIGH (ref 0–200)
HDL: 95.6 mg/dL (ref >39.00)
LDL Cholesterol: 115 mg/dL — ABNORMAL HIGH (ref 0–99)
NonHDL: 135.69
Total CHOL/HDL Ratio: 2
Triglycerides: 101 mg/dL (ref 0.0–149.0)
VLDL: 20.2 mg/dL (ref 0.0–40.0)

## 2022-12-28 LAB — CBC WITH DIFFERENTIAL/PLATELET
Basophils Absolute: 0.1 10*3/uL (ref 0.0–0.1)
Basophils Relative: 0.8 % (ref 0.0–3.0)
Eosinophils Absolute: 0 10*3/uL (ref 0.0–0.7)
Eosinophils Relative: 0.4 % (ref 0.0–5.0)
HCT: 41.8 % (ref 36.0–46.0)
Hemoglobin: 14 g/dL (ref 12.0–15.0)
Lymphocytes Relative: 24.3 % (ref 12.0–46.0)
Lymphs Abs: 2.1 10*3/uL (ref 0.7–4.0)
MCHC: 33.5 g/dL (ref 30.0–36.0)
MCV: 94.5 fl (ref 78.0–100.0)
Monocytes Absolute: 0.7 10*3/uL (ref 0.1–1.0)
Monocytes Relative: 8.8 % (ref 3.0–12.0)
Neutro Abs: 5.6 10*3/uL (ref 1.4–7.7)
Neutrophils Relative %: 65.7 % (ref 43.0–77.0)
Platelets: 376 10*3/uL (ref 150.0–400.0)
RBC: 4.42 Mil/uL (ref 3.87–5.11)
RDW: 14.7 % (ref 11.5–15.5)
WBC: 8.5 10*3/uL (ref 4.0–10.5)

## 2022-12-28 LAB — MICROALBUMIN / CREATININE URINE RATIO
Creatinine,U: 190 mg/dL
Microalb Creat Ratio: 3.5 mg/g (ref 0.0–30.0)
Microalb, Ur: 6.7 mg/dL — ABNORMAL HIGH (ref 0.0–1.9)

## 2022-12-28 LAB — TSH: TSH: 2.27 u[IU]/mL (ref 0.35–5.50)

## 2022-12-28 LAB — HEMOGLOBIN A1C: Hgb A1c MFr Bld: 5.7 % (ref 4.6–6.5)

## 2022-12-28 NOTE — Patient Instructions (Signed)
[name] , Thank you for taking time to come for your Medicare Wellness Visit. I appreciate your ongoing commitment to your health goals. Please review the following plan we discussed and let me know if I can assist you in the future.   These are the goals we discussed:  Goals      Continue eating a healthy diet     Patient Stated     Increase mobility        This is a list of the screening recommended for you and due dates:  Health Maintenance  Topic Date Due   DTaP/Tdap/Td vaccine (1 - Tdap) Never done   Zoster (Shingles) Vaccine (1 of 2) Never done   Colon Cancer Screening  07/17/2015   Mammogram  07/02/2019   Complete foot exam   09/22/2021   COVID-19 Vaccine (4 - 2023-24 season) 07/13/2022   Eye exam for diabetics  06/01/2023   Hemoglobin A1C  06/27/2023   Yearly kidney function blood test for diabetes  12/28/2023   Yearly kidney health urinalysis for diabetes  12/28/2023   Medicare Annual Wellness Visit  12/29/2023   Pneumonia Vaccine  Completed   Flu Shot  Completed   DEXA scan (bone density measurement)  Completed   Hepatitis C Screening: USPSTF Recommendation to screen - Ages 38-79 yo.  Completed   HPV Vaccine  Aged Out     Next appointment: Follow up in one year for your annual wellness visit.   Preventive Care 42 Years and Older, Female Preventive care refers to lifestyle choices and visits with your health care provider that can promote health and wellness. What does preventive care include? A yearly physical exam. This is also called an annual well check. Dental exams once or twice a year. Routine eye exams. Ask your health care provider how often you should have your eyes checked. Personal lifestyle choices, including: Daily care of your teeth and gums. Regular physical activity. Eating a healthy diet. Avoiding tobacco and drug use. Limiting alcohol use. Practicing safe sex. Taking low-dose aspirin every day. Taking vitamin and mineral supplements  as recommended by your health care provider. What happens during an annual well check? The services and screenings done by your health care provider during your annual well check will depend on your age, overall health, lifestyle risk factors, and family history of disease. Counseling  Your health care provider may ask you questions about your: Alcohol use. Tobacco use. Drug use. Emotional well-being. Home and relationship well-being. Sexual activity. Eating habits. History of falls. Memory and ability to understand (cognition). Work and work Statistician. Reproductive health. Screening  You may have the following tests or measurements: Height, weight, and BMI. Blood pressure. Lipid and cholesterol levels. These may be checked every 5 years, or more frequently if you are over 58 years old. Skin check. Lung cancer screening. You may have this screening every year starting at age 92 if you have a 30-pack-year history of smoking and currently smoke or have quit within the past 15 years. Fecal occult blood test (FOBT) of the stool. You may have this test every year starting at age 62. Flexible sigmoidoscopy or colonoscopy. You may have a sigmoidoscopy every 5 years or a colonoscopy every 10 years starting at age 9. Hepatitis C blood test. Hepatitis B blood test. Sexually transmitted disease (STD) testing. Diabetes screening. This is done by checking your blood sugar (glucose) after you have not eaten for a while (fasting). You may have this done every 1-3  years. Bone density scan. This is done to screen for osteoporosis. You may have this done starting at age 25. Mammogram. This may be done every 1-2 years. Talk to your health care provider about how often you should have regular mammograms. Talk with your health care provider about your test results, treatment options, and if necessary, the need for more tests. Vaccines  Your health care provider may recommend certain vaccines, such  as: Influenza vaccine. This is recommended every year. Tetanus, diphtheria, and acellular pertussis (Tdap, Td) vaccine. You may need a Td booster every 10 years. Zoster vaccine. You may need this after age 30. Pneumococcal 13-valent conjugate (PCV13) vaccine. One dose is recommended after age 73. Pneumococcal polysaccharide (PPSV23) vaccine. One dose is recommended after age 54. Talk to your health care provider about which screenings and vaccines you need and how often you need them. This information is not intended to replace advice given to you by your health care provider. Make sure you discuss any questions you have with your health care provider. Document Released: 11/25/2015 Document Revised: 07/18/2016 Document Reviewed: 08/30/2015 Elsevier Interactive Patient Education  2017 [place] Prevention in the Home Falls can cause injuries. They can happen to people of all ages. There are many things you can do to make your home safe and to help prevent falls. What can I do on the outside of my home? Regularly fix the edges of walkways and driveways and fix any cracks. Remove anything that might make you trip as you walk through a door, such as a raised step or threshold. Trim any bushes or trees on the path to your home. Use bright outdoor lighting. Clear any walking paths of anything that might make someone trip, such as rocks or tools. Regularly check to see if handrails are loose or broken. Make sure that both sides of any steps have handrails. Any raised decks and porches should have guardrails on the edges. Have any leaves, snow, or ice cleared regularly. Use sand or salt on walking paths during winter. Clean up any spills in your garage right away. This includes oil or grease spills. What can I do in the bathroom? Use night lights. Install grab bars by the toilet and in the tub and shower. Do not use towel bars as grab bars. Use non-skid mats or decals in the tub or  shower. If you need to sit down in the shower, use a plastic, non-slip stool. Keep the floor dry. Clean up any water that spills on the floor as soon as it happens. Remove soap buildup in the tub or shower regularly. Attach bath mats securely with double-sided non-slip rug tape. Do not have throw rugs and other things on the floor that can make you trip. What can I do in the bedroom? Use night lights. Make sure that you have a light by your bed that is easy to reach. Do not use any sheets or blankets that are too big for your bed. They should not hang down onto the floor. Have a firm chair that has side arms. You can use this for support while you get dressed. Do not have throw rugs and other things on the floor that can make you trip. What can I do in the kitchen? Clean up any spills right away. Avoid walking on wet floors. Keep items that you use a lot in easy-to-reach places. If you need to reach something above you, use a strong step stool that has a grab  bar. Keep electrical cords out of the way. Do not use floor polish or wax that makes floors slippery. If you must use wax, use non-skid floor wax. Do not have throw rugs and other things on the floor that can make you trip. What can I do with my stairs? Do not leave any items on the stairs. Make sure that there are handrails on both sides of the stairs and use them. Fix handrails that are broken or loose. Make sure that handrails are as long as the stairways. Check any carpeting to make sure that it is firmly attached to the stairs. Fix any carpet that is loose or worn. Avoid having throw rugs at the top or bottom of the stairs. If you do have throw rugs, attach them to the floor with carpet tape. Make sure that you have a light switch at the top of the stairs and the bottom of the stairs. If you do not have them, ask someone to add them for you. What else can I do to help prevent falls? Wear shoes that: Do not have high heels. Have  rubber bottoms. Are comfortable and fit you well. Are closed at the toe. Do not wear sandals. If you use a stepladder: Make sure that it is fully opened. Do not climb a closed stepladder. Make sure that both sides of the stepladder are locked into place. Ask someone to hold it for you, if possible. Clearly mark and make sure that you can see: Any grab bars or handrails. First and last steps. Where the edge of each step is. Use tools that help you move around (mobility aids) if they are needed. These include: Canes. Walkers. Scooters. Crutches. Turn on the lights when you go into a dark area. Replace any light bulbs as soon as they burn out. Set up your furniture so you have a clear path. Avoid moving your furniture around. If any of your floors are uneven, fix them. If there are any pets around you, be aware of where they are. Review your medicines with your doctor. Some medicines can make you feel dizzy. This can increase your chance of falling. Ask your doctor what other things that you can do to help prevent falls. This information is not intended to replace advice given to you by your health care provider. Make sure you discuss any questions you have with your health care provider. Document Released: 08/25/2009 Document Revised: 04/05/2016 Document Reviewed: 12/03/2014 Elsevier Interactive Patient Education  2017 [employer].

## 2022-12-28 NOTE — Progress Notes (Unsigned)
[facility]  Patient ID: Megan Duncan,  MRN: [id number]  PCP: Ann Held, DO  Date of Evaluation: 12/28/2022 Time spent:40 minutes  Chief Complaint:  Chief Complaint   Follow-up    HISTORY/CURRENT STATUS: HPI For routine follow-up.  Daughter Megan Duncan, husband Megan Duncan   Severe nausea for 3 wks, starting before Austedo was started. Has seen her PCP, will be seeing GI in April. No vomiting, constipation, diarrhea, or GERD. Starting on Omeprazole today or tomorrow. Megan Duncan is wondering if any of her psych meds could be related. Only change is the Austedo.  In more pain, sciatica. Not sleeping well b/c of nausea and pain. Can't enjoy anything, hurts too much. No energy. Stays in bed most of the time. Not sleeping a lot like she has in the past. Denies nightmares.  Appetite decreased d/t nausea, has lost about 20 # in past month. Anxiety is no worse. Not having PA. No sx of akathisia since starting Mirtazapine. No SI/HI.  Patient denies increased energy with decreased need for sleep, increased talkativeness, racing thoughts, impulsivity or risky behaviors, increased spending, increased libido, grandiosity, increased irritability or anger, paranoia, or hallucinations.  Review of Systems  Constitutional:  Positive for malaise/fatigue and weight loss.  HENT: Negative.    Eyes: Negative.   Respiratory: Negative.    Cardiovascular: Negative.   Gastrointestinal:  Positive for nausea.  Genitourinary: Negative.   Musculoskeletal:  Positive for back pain.  Skin: Negative.   Neurological:  Positive for tremors.       Sciatic pain   Endo/Heme/Allergies: Negative.   Psychiatric/Behavioral:         See HPI   Individual Medical History/ Review of Systems: Changes? :Yes   see HPI and chart for more details:   chronic nausea will see GI in April Sees neuro surg in 3 weeks   Past medications for mental health diagnoses include: Trileptal Lamictal Prozac Zoloft Effexor XR,  Cymbalta, Paxil, lithium, Depakote, Latuda, Risperdal, Tegretol, Wellbutrin, propranolol for tremor, Thorazine  Haldol and Zyprexa given for a few days inpatient January 2023, propranolol for lithium tremor, Ingrezza caused nausea  Hospitalized in January 2023 at [facility] for altered mental status with hallucinations, paranoia, and aggressive behavior  Allergies: Atorvastatin, Codeine, Diclofenac, Doxycycline, Fluconazole, and Sulfonamide derivatives  Current Medications:  Current Outpatient Medications:    acetaminophen (TYLENOL) 650 MG CR tablet, Take 650 mg by mouth every 8 (eight) hours as needed for pain., Disp: , Rfl:    Deutetrabenazine (AUSTEDO PATIENT TITRATION KIT) 6 & 9 & 12 MG TBPK, Take 1 kit by mouth daily., Disp: 1 each, Rfl: 0   dicyclomine (BENTYL) 10 MG capsule, TAKE 1 CAPSULE (10 MG TOTAL) BY MOUTH 4 TIMES A DAY BEFORE MEALS AND AT BEDTIME, Disp: 360 capsule, Rfl: 0   DULoxetine (CYMBALTA) 20 MG capsule, TAKE 1 CAPSULE (20 MG TOTAL) BY MOUTH DAILY. TAKE WITH 60MG TO EQUAL 80MG DAILY, Disp: 90 capsule, Rfl: 1   DULoxetine (CYMBALTA) 60 MG capsule, Take 1 capsule (60 mg total) by mouth daily. With 20 mg=80 mg., Disp: 90 capsule, Rfl: 1   haloperidol (HALDOL) 1 MG tablet, 1 po q am, 2 po qhs., Disp: 90 tablet, Rfl: 5   hydrocortisone-pramoxine (ANALPRAM HC) 2.5-1 % rectal cream, Place 1 application rectally 3 (three) times daily., Disp: 30 g, Rfl: 3   Lancets MISC, Use as directed once a day.  Dx code: E11.9, Disp: 30 each, Rfl: 2   levothyroxine (SYNTHROID) 50 MCG  tablet, TAKE 1 TABLET BY MOUTH EVERY DAY BEFORE BREAKFAST, Disp: 90 tablet, Rfl: 1   lidocaine (LIDODERM) 5 %, Place 1 patch onto the skin daily as needed. Remove & Discard patch within 12 hours or as directed by MD, Disp: 30 patch, Rfl: 0   LORazepam (ATIVAN) 1 MG tablet, Take 1/2 tab daily as needed and 1 tab at bedtime., Disp: 45 tablet, Rfl: 5   metFORMIN (GLUCOPHAGE) 1000 MG tablet, 1 po  qd, Disp: 90 tablet, Rfl: 1   mirtazapine (REMERON) 7.5 MG tablet, TAKE 1 TABLETS BY MOUTH AT BEDTIME DAILY., Disp: 180 tablet, Rfl: 1   ondansetron (ZOFRAN) 4 MG tablet, Take 1 tablet (4 mg total) by mouth every 8 (eight) hours as needed for nausea or vomiting., Disp: 20 tablet, Rfl: 0   prazosin (MINIPRESS) 2 MG capsule, Take 1 capsule (2 mg total) by mouth at bedtime., Disp: 90 capsule, Rfl: 1   QUEtiapine (SEROQUEL) 200 MG tablet, TAKE 1 TABLET BY MOUTH EVERYDAY AT BEDTIME, Disp: 90 tablet, Rfl: 0   Aspirin-Caffeine (BAYER BACK & BODY) 500-32.5 MG TABS, Take 1 tablet by mouth daily as needed. Reports takes 1-2 as needed (Patient not taking: Reported on 12/27/2022), Disp: , Rfl:    omeprazole (PRILOSEC) 20 MG capsule, Take 1 capsule (20 mg total) by mouth daily. (Patient not taking: Reported on 12/28/2022), Disp: 30 capsule, Rfl: 3 Medication Side Effects: nausea  Family Medical/ Social History: Changes? No  MENTAL HEALTH EXAM:  There were no vitals taken for this visit.There is no height or weight on file to calculate BMI.  General Appearance: Casual and Well Groomed  Eye Contact:  Good  Speech:  Clear and Coherent and Normal Rate  Volume:  Normal  Mood:   sad  Affect:  Congruent  Thought Process:  Goal Directed and Descriptions of Associations: Circumstantial  Orientation:  Full (Time, Place, and Person)  Thought Content: Logical   Suicidal Thoughts:  No  Homicidal Thoughts:  No  Memory:  Immediate;   Poor Recent;   Poor Remote;   Good  Judgement:  Good  Insight:  Good  Psychomotor Activity:  Abnormal mandibular motion up and down, worse with distraction.  No tongue protrusion or truncal movements noted.    Concentration:  Concentration: Fair and Attention Span: Good  Recall:  Good  Fund of Knowledge: Good  Language: Good  Assets:  Desire for Improvement Financial Resources/Insurance Housing Social Support Transportation  ADL's:  Intact  Cognition: WNL  Prognosis:  Good    Pertinent Labs  12/24/2022 CBC nl CMP glu 116  12/27/2022 TC 231, Trig, 101, HDL, LDL 115 A1C 5.7  DIAGNOSES:    ICD-10-CM   1. Bipolar II disorder ([place])  F31.81     2. Tardive dyskinesia  G24.01     3. Generalized anxiety disorder  F41.1     4. 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 Nightmares  F51.5     6. Degenerative lumbar spinal stenosis  M48.061     7. Fibromyalgia  M79.7     8. 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-sided sciatica  M54.42    G89.29     9. Nausea without vomiting  R11.0       Receiving Psychotherapy: No   RECOMMENDATIONS:  PDMP reviewed.  Ativan filled 08/19/2023. I provided 40 minutes of face to face time during this encounter, including time spent before and after the visit in records review, medical decision making, counseling pertinent to today's  visit, and charting.   Discussed the nausea. It started before the Austedo so I don't think related. Austedo isn't helping much yet, but has been on 2 weeks, and not yet at therapeutic dose.  Psych meds have not been changed in at least 6 months. Aricept can cause nausea but she's been on it almost a year. Was started inpt: From hospital d/c 12/03/2021 "She was initiated on Aricept 5 mg at bedtime.  She had memory loss of her admission.  As time went on her cognition improved and she was able to carry on conversations and she was no longer irritable or confused, and she started sleeping better."  Not sure if it's helping anything or not, she was psychotic at the time of hospitalization, had her baseline memory (fair) until then, we agree to get off the Aricept to eliminate that as a possible cause of nausea.  Will continue titration of Austedo.  No other changes for now.   Wean off Aricept, 1/2 pill for 4 nights and then stop. Continue Cymbalta 80 mg daily. Continue Haldol 1 mg, 1 p.o. q am, and 2 po qhs. (ok to take an extra 1/2 pill in the middle of the night if she becomes really agitated.  Let me know the  next day if they have to do that.) Continue Ativan 1 mg, 1 p.o. nightly but also can take 1/2-1 po during day prn.  (And an extra 1/2 pill during the night if needed.) Continue mirtazapine 7.5 mg, 1 p.o. nightly. Continue prazosin 2 mg p.o. nightly at 8 PM. Continue Seroquel 200 mg, 1 p.o. nightly at 8 PM. Return in 4 weeks.  Donnal Moat, PA-C

## 2022-12-28 NOTE — Progress Notes (Signed)
Subjective:   Megan Duncan is a 77 y.o. female who presents for Medicare Annual (Subsequent) preventive examination.  I connected with  [name] on 12/28/22 by a audio enabled telemedicine application and verified that I am speaking with the correct person using two identifiers.  Patient Location: Home  Provider Location: Office/Clinic  I discussed the limitations of evaluation and management by telemedicine. The patient expressed understanding and agreed to proceed.   Review of Systems    Defer to PCP Cardiac Risk Factors include: advanced age (>68mn, >>97women);diabetes mellitus;dyslipidemia;hypertension     Objective:    Today's Vitals   12/28/22 1542  PainSc: 7    There is no height or weight on file to calculate BMI.     12/28/2022    3:42 PM 08/02/2022   11:16 AM 12/03/2021    4:10 AM 12/02/2021    7:52 PM 12/01/2021    6:03 AM 11/29/2021    8:25 AM 11/25/2021    3:21 PM  Advanced Directives  Does Patient Have a Medical Advance Directive? No No No No No No No  Would patient like information on creating a medical advance directive? No - Patient declined No - Patient declined No - Patient declined No - Patient declined  No - Patient declined No - Patient declined    Current Medications (verified) Outpatient Encounter Medications as of 12/28/2022  Medication Sig   acetaminophen (TYLENOL) 650 MG CR tablet Take 650 mg by mouth every 8 (eight) hours as needed for pain.   Aspirin-Caffeine (BAYER BACK & BODY) 500-32.5 MG TABS Take 1 tablet by mouth daily as needed. Reports takes 1-2 as needed (Patient not taking: Reported on 12/27/2022)   Deutetrabenazine (AUSTEDO PATIENT TITRATION KIT) 6 & 9 & 12 MG TBPK Take 1 kit by mouth daily.   dicyclomine (BENTYL) 10 MG capsule TAKE 1 CAPSULE (10 MG TOTAL) BY MOUTH 4 TIMES A DAY BEFORE MEALS AND AT BEDTIME   DULoxetine (CYMBALTA) 20 MG capsule TAKE 1 CAPSULE (20 MG TOTAL) BY MOUTH DAILY. TAKE WITH 60MG TO EQUAL 80MG DAILY    DULoxetine (CYMBALTA) 60 MG capsule Take 1 capsule (60 mg total) by mouth daily. With 20 mg=80 mg.   haloperidol (HALDOL) 1 MG tablet 1 po q am, 2 po qhs.   hydrocortisone-pramoxine (ANALPRAM HC) 2.5-1 % rectal cream Place 1 application rectally 3 (three) times daily.   Lancets MISC Use as directed once a day.  Dx code: E11.9   levothyroxine (SYNTHROID) 50 MCG tablet TAKE 1 TABLET BY MOUTH EVERY DAY BEFORE BREAKFAST   lidocaine (LIDODERM) 5 % Place 1 patch onto the skin daily as needed. Remove & Discard patch within 12 hours or as directed by MD   LORazepam (ATIVAN) 1 MG tablet Take 1/2 tab daily as needed and 1 tab at bedtime.   metFORMIN (GLUCOPHAGE) 1000 MG tablet 1 po qd   mirtazapine (REMERON) 7.5 MG tablet TAKE 1 TABLETS BY MOUTH AT BEDTIME DAILY.   omeprazole (PRILOSEC) 20 MG capsule Take 1 capsule (20 mg total) by mouth daily. (Patient not taking: Reported on 12/28/2022)   ondansetron (ZOFRAN) 4 MG tablet Take 1 tablet (4 mg total) by mouth every 8 (eight) hours as needed for nausea or vomiting.   prazosin (MINIPRESS) 2 MG capsule Take 1 capsule (2 mg total) by mouth at bedtime.   QUEtiapine (SEROQUEL) 200 MG tablet TAKE 1 TABLET BY MOUTH EVERYDAY AT BEDTIME   No facility-administered encounter medications on file as of  12/28/2022.    Allergies (verified) Atorvastatin, Codeine, Diclofenac, Doxycycline, Fluconazole, and Sulfonamide derivatives   History: Past Medical History:  Diagnosis Date   Acute pain of right shoulder 09/24/2018   Acute upper respiratory infection 09/19/2010   Allergic rhinitis 10/03/2016   Anterolisthesis of lumbar spine    Arthritis    hands   Bilateral hip pain 09/02/2014   Bipolar II disorder    Cellulitis and abscess of trunk 09/15/2009   Ceruminosis 03/04/2014   Cervicalgia 06/20/2007   Chronic low back pain 06/20/2007   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-sided sciatica 08/06/2012   COPD (chronic obstructive pulmonary disease) ([place])     Deformity of finger 07/15/2014   Degeneration of lumbar intervertebral disc 08/22/2020   Degenerative lumbar spinal stenosis    Dermatitis [id number]   Diastolic dysfunction    Per pt, diagnosed after [facility]   Diverticulosis    DOE (dyspnea on exertion) 07/20/2016   Essential (primary) hypertension 04/23/2007   Estrogen deficiency 09/01/2017   External hemorrhoid 08/01/2015   Fibromyalgia    Foraminal stenosis of lumbar region 08/22/2020   Generalized anxiety disorder 09/01/2018   Hyperlipidemia    Inflammatory disease of uterus 04/23/2007   Left flank pain 07/13/2021   Left upper quadrant pain 09/01/2017   Lower abdominal pain 08/06/2012   Lumbar back pain with radiculopathy affecting left lower extremity 01/24/2010   Lumbar radiculopathy 01/26/2021   Lump in upper inner quadrant of left breast 07/13/2021   Major depressive disorder    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   Otitis media 09/05/2010   Palpitations 07/20/2016   Peripheral neuropathy 02/22/2010   Retrolisthesis of vertebrae 08/22/2020   Shingles outbreak 04/14/2015   Sinusitis, acute maxillary 09/14/2013   Stress incontinence, female 08/01/2015   Thoracic aortic atherosclerosis 05/14/2017   Tinea corporis 09/01/2017   Tinnitus 03/04/2014   Torticollis 03/04/2014   Type II diabetes mellitus 06/05/2018   Past Surgical History:  Procedure Laterality Date   APPENDECTOMY     EYE SURGERY     KNEE ARTHROSCOPY Right 11/12/2006   TONSILLECTOMY     TOTAL ABDOMINAL HYSTERECTOMY     Family History  Problem Relation Age of Onset   Hypertension Mother    Hiatal hernia Mother    Diabetes Father    Hypertension Father    Heart attack Father    Alcoholism Father    Bipolar disorder Father    Mental illness Sister    Suicidality Sister    Colon cancer Maternal Aunt 80   Breast cancer Maternal Aunt    Bipolar disorder Paternal Aunt    Social History   Socioeconomic History    Marital status: Married    Spouse name: Mileya Gironda   Number of children: 3   Years of education: 14   Highest education level: Some college, no degree  Occupational History   Occupation: retired  Tobacco Use   Smoking status: Former    Packs/day: 1.50    Years: 40.00    Total pack years: 60.00    Types: Cigarettes    Quit date: 07/02/2004    Years since quitting: 18.5    Passive exposure: Past   Smokeless tobacco: Never   Tobacco comments:    Verified by Aldona Lento (Husband), & Lauralee Evener (Daughter)  Vaping Use   Vaping Use: Never used  Substance and Sexual Activity   Alcohol use: Yes    Alcohol/week: 0.0 - 1.0 standard drinks of alcohol  Comment: occasional wine   Drug use: No   Sexual activity: Never  Other Topics Concern   Not on file  Social History Narrative   Lives with husband and grandson she is raising.   11/07/21 Lives with husband   Social Determinants of Health   Financial Resource Strain: Medium Risk (11/29/2021)   Overall Financial Resource Strain (CARDIA)    Difficulty of Paying Living Expenses: Somewhat hard  Food Insecurity: No Food Insecurity (12/28/2022)   Hunger Vital Sign    Worried About Running Out of Food in the Last Year: Never true    Ran Out of Food in the Last Year: Never true  Transportation Needs: No Transportation Needs (12/28/2022)   PRAPARE - Hydrologist (Medical): No    Lack of Transportation (Non-Medical): No  Physical Activity: Inactive (11/29/2021)   Exercise Vital Sign    Days of Exercise per Week: 0 days    Minutes of Exercise per Session: 0 min  Stress: No Stress Concern Present (11/29/2021)   [place] of Stress : Not at all  Social Connections: [place] (11/29/2021)   Social Connection and Isolation Panel [NHANES]    Frequency of Communication with Friends and Family: More than three times a week     Frequency of Social Gatherings with Friends and Family: More than three times a week    Attends Religious Services: 1 to 4 times per year    Active Member of [employer] or Organizations: Yes    Attends Music therapist: More than 4 times per year    Marital Status: Married    Tobacco Counseling Counseling given: Not Answered Tobacco comments: Verified by Aldona Lento (Husband), & Lauralee Evener (Daughter)   Clinical Intake:  Pre-visit preparation completed: Yes  Pain : 0-10 Pain Score: 7  Pain Type: Neuropathic pain Pain Location: Other (Comment) (sciatic nerve pain) Pain Orientation: Right Pain Onset: More than a month ago Pain Frequency: Intermittent   Nutritional Risks: None Diabetes: Yes CBG done?: No Did pt. bring in CBG monitor from home?: No (audio visit)  How often do you need to have someone help you when you read instructions, pamphlets, or other written materials from your doctor or pharmacy?: 3 - Sometimes   Activities of Daily Living    12/28/2022    3:49 PM  In your present state of health, do you have any difficulty performing the following activities:  Hearing? 1  Vision? 0  Difficulty concentrating or making decisions? 1  Walking or climbing stairs? 1  Dressing or bathing? 1  Doing errands, shopping? 1  Preparing Food and eating ? Y  Comment preparing meals  Using the Toilet? N  In the past six months, have you accidently leaked urine? Y  Do you have problems with loss of bowel control? N  Managing your Medications? Y  Managing your Finances? Y  Housekeeping or managing your Housekeeping? Y    Patient Care Team: Carollee Herter, Alferd Apa, DO as PCP - General (Family Medicine) Luz Brazen as Physician Assistant (Physician Assistant) Milus Banister, MD as Attending Physician (Gastroenterology) Sherlynn Stalls, MD as Consulting Physician (Ophthalmology)  Indicate any recent Medical Services you may have received from other than  [facility] providers in the past year (date may be approximate).     Assessment:   This is a routine wellness examination for Kaslyn.  Hearing/Vision screen No results found.  Dietary issues and exercise activities discussed: Current Exercise Habits: The patient does not participate in regular exercise at present, Exercise limited by: orthopedic condition(s)   Goals Addressed   None    Depression Screen    12/28/2022    3:57 PM 08/07/2022    1:05 PM 11/29/2021    8:13 AM 08/20/2021   12:36 PM 08/15/2021    4:02 PM 11/16/2019   11:04 AM 06/19/2019   11:12 AM  PHQ 2/9 Scores  PHQ - 2 Score 6 0 0  0 0 0  PHQ- 9 Score 16        Exception Documentation   Medical reason      Not completed   Verified by Aldona Lento (Husband), & Lauralee Evener (Daughter)         Information is confidential and restricted. Go to Review Flowsheets to unlock data.    Fall Risk    12/28/2022    3:42 PM 08/07/2022    1:04 PM 11/29/2021    8:19 AM 11/07/2021    9:15 AM 08/15/2021    4:01 PM  Fall Risk   Falls in the past year? 0 1 1 0 0  Number falls in past yr: 0 0 0  0  Injury with Fall? 0 1 0  0  Risk for fall due to : No Fall Risks Impaired mobility;Impaired balance/gait Impaired balance/gait;Impaired mobility;Mental status change    Follow up Falls evaluation completed Falls evaluation completed;Falls prevention discussed Falls evaluation completed;Education provided;Falls prevention discussed  Falls prevention discussed    FALL RISK PREVENTION PERTAINING TO THE HOME:  Any stairs in or around the home? Yes  If so, are there any without handrails? No  Home free of loose throw rugs in walkways, pet beds, electrical cords, etc? Yes  Adequate lighting in your home to reduce risk of falls? Yes   ASSISTIVE DEVICES UTILIZED TO PREVENT FALLS:  Life alert? No  Use of a cane, walker or w/c? No  Grab bars in the bathroom? No  Shower chair or bench in shower? Yes  Elevated toilet seat or a handicapped  toilet? No   TIMED UP AND GO:  Was the test performed?  No, audio visit .    Cognitive Function:    12/28/2022    4:02 PM 08/20/2021   12:30 PM 01/10/2017   10:52 AM  MMSE - Mini Mental State Exam  Not completed: Unable to complete    Orientation to time   5  Orientation to Place   5  Registration   3  Attention/ Calculation   4  Recall   3  Language- name 2 objects   2  Language- repeat   1  Language- follow 3 step command   3  Language- read & follow direction   1  Write a sentence   1  Copy design   1  Total score   29     Information is confidential and restricted. Go to Review Flowsheets to unlock data.        08/15/2021    4:18 PM  6CIT Screen  What Year? 0 points  What month? 0 points  What time? 3 points  Count back from 20 0 points  Months in reverse 4 points  Repeat phrase 2 points  Total Score 9 points    Immunizations Immunization History  Administered Date(s) Administered   Fluad Quad(high Dose 65+) 07/13/2021, 07/27/2022   Influenza Whole 08/17/2009, 08/09/2010, 09/24/2012, 08/11/2013   Influenza,  High Dose Seasonal PF 09/12/2016, 08/30/2017, 09/23/2018   Influenza,inj,Quad PF,6+ Mos 07/15/2014, 08/01/2015   Influenza-Unspecified 09/21/2020   PFIZER(Purple Top)SARS-COV-2 Vaccination 12/24/2019, 01/18/2020, 09/21/2020   Pneumococcal Conjugate-13 07/15/2014   Pneumococcal Polysaccharide-23 08/01/2015   Zoster, Live 09/24/2012    TDAP status: Due, Education has been provided regarding the importance of this vaccine. Advised may receive this vaccine at local pharmacy or Health Dept. Aware to provide a copy of the vaccination record if obtained from local pharmacy or Health Dept. Verbalized acceptance and understanding.  Flu Vaccine status: Up to date  Pneumococcal vaccine status: Up to date  Covid-19 vaccine status: Information provided on how to obtain vaccines.   Qualifies for Shingles Vaccine? Yes   Zostavax completed Yes   Shingrix  Completed?: No.    Education has been provided regarding the importance of this vaccine. Patient has been advised to call insurance company to determine out of pocket expense if they have not yet received this vaccine. Advised may also receive vaccine at local pharmacy or Health Dept. Verbalized acceptance and understanding.  Screening Tests Health Maintenance  Topic Date Due   DTaP/Tdap/Td (1 - Tdap) Never done   Zoster Vaccines- Shingrix (1 of 2) Never done   COLONOSCOPY (Pts 45-66yr Insurance coverage will need to be confirmed)  07/17/2015   MAMMOGRAM  07/02/2019   FOOT EXAM  09/22/2021   COVID-19 Vaccine (4 - 2023-24 season) 07/13/2022   Medicare Annual Wellness (AWV)  08/15/2022   OPHTHALMOLOGY EXAM  06/01/2023   HEMOGLOBIN A1C  06/27/2023   Diabetic kidney evaluation - eGFR measurement  12/28/2023   Diabetic kidney evaluation - Urine ACR  12/28/2023   Pneumonia Vaccine 77 Years old  Completed   INFLUENZA VACCINE  Completed   DEXA SCAN  Completed   Hepatitis C Screening  Completed   HPV VACCINES  Aged Out    Health Maintenance  Health Maintenance Due  Topic Date Due   DTaP/Tdap/Td (1 - Tdap) Never done   Zoster Vaccines- Shingrix (1 of 2) Never done   COLONOSCOPY (Pts 45-490yrInsurance coverage will need to be confirmed)  07/17/2015   MAMMOGRAM  07/02/2019   FOOT EXAM  09/22/2021   COVID-19 Vaccine (4 - 2023-24 season) 07/13/2022   Medicare Annual Wellness (AWV)  08/15/2022    Colorectal Cancer screen: pt will schedule.  Mammogram status: Ordered 12/28/22. Pt provided with contact info and advised to call to schedule appt.   Bone Density status: Ordered 12/28/22. Pt provided with contact info and advised to call to schedule appt.  Lung Cancer Screening: (Low Dose CT Chest recommended if Age 77-80ears, 30 pack-year currently smoking OR have quit w/in 15years.) does not qualify.   Additional Screening:  Hepatitis C Screening: does qualify; Completed  10/18/15  Vision Screening: Recommended annual ophthalmology exams for early detection of glaucoma and other disorders of the eye. Is the patient up to date with their annual eye exam?  Yes  Who is the provider or what is the name of the office in which the patient attends annual eye exams? Dr. LyPrudencio Burlyf pt is not established with a provider, would they like to be referred to a provider to establish care? No .   Dental Screening: Recommended annual dental exams for proper oral hygiene  Community Resource Referral / Chronic Care Management: CRR required this visit?  No   CCM required this visit?  No      Plan:     I have personally reviewed and noted  the following in the patient's chart:   Medical and social history Use of alcohol, tobacco or illicit drugs  Current medications and supplements including opioid prescriptions. Patient is not currently taking opioid prescriptions. Functional ability and status Nutritional status Physical activity Advanced directives List of other physicians Hospitalizations, surgeries, and ER visits in previous 12 months Vitals Screenings to include cognitive, depression, and falls Referrals and appointments  In addition, I have reviewed and discussed with patient certain preventive protocols, quality metrics, and best practice recommendations. A written personalized care plan for preventive services as well as general preventive health recommendations were provided to patient.   Due to this being a telephonic visit, the after visit summary with patients personalized plan was offered to patient via mail or my-chart. Patient would like to access on my-chart.  Beatris Ship, [place]   12/28/2022   Nurse Notes: None

## 2023-01-06 ENCOUNTER — Other Ambulatory Visit: Payer: Self-pay | Admitting: Physician Assistant

## 2023-01-07 DIAGNOSIS — M545 Low back pain, unspecified: Secondary | ICD-10-CM | POA: Diagnosis not present

## 2023-01-14 DIAGNOSIS — M545 Low back pain, unspecified: Secondary | ICD-10-CM | POA: Diagnosis not present

## 2023-01-15 ENCOUNTER — Telehealth: Payer: Self-pay | Admitting: Physician Assistant

## 2023-01-15 ENCOUNTER — Other Ambulatory Visit: Payer: Self-pay | Admitting: Physician Assistant

## 2023-01-15 NOTE — Telephone Encounter (Signed)
Pt's daughter called at 1:21p.  She is on DPR.  She would like refill of Deutetrabenazine (AUSTEDO PATIENT TITRATION KIT) 6 & 9 & 12 MG TBPK  sent to   [facility], [address] [place], [place] [address] Phone: [phone]  Fax: [phone]   Next appt 3/15

## 2023-01-15 NOTE — Telephone Encounter (Signed)
What dose is patient taking?

## 2023-01-16 NOTE — Telephone Encounter (Signed)
Received a message from daughter requesting an Austedo titration kit refill. She said she has been taking it twice a day and she is at the end of the kit. Dan Humphreys had reached out to patient's husband to get a RF.  I didn't think we would send in a new titration kit.  [name] said patient doesn't seem to be having any side effects but that they can't say they notice any change.

## 2023-01-16 NOTE — Telephone Encounter (Signed)
Was she able to get the Austedo XR or was she required to get the IR? Might need to check w/ Dan Humphreys on that. Also confirm highest dose on the titration pack. I can probably increase the dose and hopefully we'll get better results.

## 2023-01-17 ENCOUNTER — Telehealth: Payer: Self-pay

## 2023-01-17 MED ORDER — AUSTEDO XR PATIENT TITRATION 6 & 12 & 24 MG PO TEPK: 1.0000 | EXTENDED_RELEASE_TABLET | ORAL | 0 refills | Status: DC

## 2023-01-17 NOTE — Telephone Encounter (Signed)
Thanks, yes let's do the XR now. We have an XR titration kit if that'll help, or I'll send it to [place].

## 2023-01-17 NOTE — Telephone Encounter (Signed)
Since you previously submitted her Austedo XR we used that Rx. I will submit her PA.

## 2023-01-17 NOTE — Telephone Encounter (Signed)
Approved. has been approved under the Member's Medicare Part D benefit for AUSTEDO XR PATIENT TITRATION TBER Ther Pack 6 & 12 & '24MG'$ . Approved quantity: 42 per 28 day(s). You may fill up to a 90 day supply except for those on Specialty Tier 5, which can be filled up to a 30 day supply.  Authorization Expiration Date: November 11, 2098.

## 2023-01-17 NOTE — Telephone Encounter (Signed)
Just to clarify what we did with the Austedo, there may be some confusion. I didn't see the phone message. What we had to do was start her on the Austedo IR titration pack. Her last dose she had tried the 6 mg and 9 mg but it was not helping her movements were still bad. She ran out this past Friday so either way she will have to restart the Austedo. We can now justify trying the Austedo XR if you want to go ahead and order the XR now, a PA should be approved with all of that information.   Let me know.

## 2023-01-17 NOTE — Telephone Encounter (Signed)
Megan Duncan has responded to [place] in regards to this.

## 2023-01-21 ENCOUNTER — Other Ambulatory Visit: Payer: Self-pay | Admitting: Physician Assistant

## 2023-01-21 DIAGNOSIS — M545 Low back pain, unspecified: Secondary | ICD-10-CM | POA: Diagnosis not present

## 2023-01-25 ENCOUNTER — Telehealth (INDEPENDENT_AMBULATORY_CARE_PROVIDER_SITE_OTHER): Payer: Medicare Other | Admitting: Physician Assistant

## 2023-01-25 ENCOUNTER — Encounter: Payer: Self-pay | Admitting: Physician Assistant

## 2023-01-25 DIAGNOSIS — G2401 Drug induced subacute dyskinesia: Secondary | ICD-10-CM | POA: Diagnosis not present

## 2023-01-25 DIAGNOSIS — F411 Generalized anxiety disorder: Secondary | ICD-10-CM | POA: Diagnosis not present

## 2023-01-25 DIAGNOSIS — F4310 Post-traumatic stress disorder, unspecified: Secondary | ICD-10-CM | POA: Diagnosis not present

## 2023-01-25 DIAGNOSIS — F3181 Bipolar II disorder: Secondary | ICD-10-CM

## 2023-01-25 NOTE — Progress Notes (Signed)
[facility]  Patient ID: Megan Duncan,  MRN: [id number]  PCP: Donato Schultz, DO  Date of Evaluation: 01/25/2023 Time spent:20 minutes  Chief Complaint:  Chief Complaint   Follow-up   Virtual Visit via Telehealth  I connected with patient by a video enabled telemedicine application with their informed consent, and verified patient privacy and that I am speaking with the correct person using two identifiers.  I am private, in my office and the patient is at home.  I discussed the limitations, risks, security and privacy concerns of performing an evaluation and management service by video and the availability of in person appointments. I also discussed with the patient that there may be a patient responsible charge related to this service. The patient expressed understanding and agreed to proceed.   I discussed the assessment and treatment plan with the patient. The patient was provided an opportunity to ask questions and all were answered. The patient agreed with the plan and demonstrated an understanding of the instructions.   The patient was advised to call back or seek an in-person evaluation if the symptoms worsen or if the condition fails to improve as anticipated.  I provided 20  minutes of non-face-to-face time during this encounter.  HISTORY/CURRENT STATUS: HPI For routine follow-up.  Daughter [name], husband Orvilla Fus also on the video.  Off Austedo ran out, 2 weeks ago.  Has not been able to get it refilled, not sure why but probably miscommunication with [place] pharmacy.  It was not helping the tardive dyskinesia at the dose she ended up on after taking the titration pack.  They are wondering whether she should go back to the Ingrezza.  She only took a few pills initially, thinking it may have contributed to nausea but now she and her family are not so sure.   Nausea  went completely away, once the Aricept was stopped a few weeks ago.  She is back on  omeprazole, so that could be helping as well.  Also metformin dose was decreased.  So it is not really clear what was causing the nausea but she is better and that is what counts.  Appetite is normal, weight is stable.  She does not really enjoying much of anything, because of her physical health.  Feels that her medications are helping her mood.  Energy and motivation are fair to good depending on the day.  She still spends a lot of time in bed, not due to depression but more the chronic back pain.  No extreme sadness, tearfulness, or feelings of hopelessness.  Sleeps well most of the time.  ADLs are limited due to the back pain.  Personal hygiene is normal.  Denies any changes in memory.  Denies suicidal or homicidal thoughts.  Is stable on antipsychotics.  She and her family do not want to make any changes because of her history of psychosis, even though the Haldol and Seroquel are causing tardive dyskinesia.  They understand that.  Patient denies increased energy with decreased need for sleep, increased talkativeness, racing thoughts, impulsivity or risky behaviors, increased spending, increased libido, grandiosity, increased irritability or anger, paranoia, or hallucinations.  Review of Systems  Constitutional:  Positive for malaise/fatigue.  HENT: Negative.    Eyes: Negative.   Respiratory: Negative.    Cardiovascular: Negative.   Gastrointestinal: Negative.   Genitourinary: Negative.   Musculoskeletal:  Positive for back pain.  Skin: Negative.   Neurological:        See HPI  Endo/Heme/Allergies: Negative.   Psychiatric/Behavioral:         See HPI    Individual Medical History/ Review of Systems: Changes? :Yes   In water PT now  Past medications for mental health diagnoses include: Trileptal Lamictal Prozac Zoloft Effexor XR, Cymbalta, Paxil, lithium, Depakote, Latuda, Risperdal, Tegretol, Wellbutrin, propranolol for tremor, Thorazine  Haldol and Zyprexa given for a few days inpatient  January 2023, propranolol for lithium tremor, Ingrezza caused nausea  Hospitalized in January 2023 at [facility] for altered mental status with hallucinations, paranoia, and aggressive behavior  Allergies: Atorvastatin, Codeine, Diclofenac, Doxycycline, Fluconazole, and Sulfonamide derivatives  Current Medications:  Current Outpatient Medications:    acetaminophen (TYLENOL) 650 MG CR tablet, Take 650 mg by mouth every 8 (eight) hours as needed for pain., Disp: , Rfl:    DULoxetine (CYMBALTA) 20 MG capsule, TAKE 1 CAPSULE BY MOUTH DAILY. TAKE WITH 60mg  FOR total OF 80mg  DAILY, Disp: 90 capsule, Rfl: 1   DULoxetine (CYMBALTA) 60 MG capsule, TAKE 1 CAPSULE BY MOUTH DAILY. TAKE WITH 20mg  FOR total OF 80mg  DAILY, Disp: 90 capsule, Rfl: 1   haloperidol (HALDOL) 1 MG tablet, 1 po q am, 2 po qhs., Disp: 90 tablet, Rfl: 5   Lancets MISC, Use as directed once a day.  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 Remove & Discard patch within 12 hours or as directed by MD, Disp: 30 patch, Rfl: 0   LORazepam (ATIVAN) 1 MG tablet, Take 1/2 tab daily as needed and 1 tab at bedtime., Disp: 45 tablet, Rfl: 5   metFORMIN (GLUCOPHAGE) 1000 MG tablet, 1 po qd (Patient taking differently: Take 500 mg by mouth daily with breakfast. 1 po qd), Disp: 90 tablet, Rfl: 1   mirtazapine (REMERON) 7.5 MG tablet, TAKE 1 TABLETS BY MOUTH AT BEDTIME DAILY., Disp: 180 tablet, Rfl: 1   omeprazole (PRILOSEC) 20 MG capsule, Take 1 capsule (20 mg total) by mouth daily., Disp: 30 capsule, Rfl: 3   ondansetron (ZOFRAN) 4 MG tablet, Take 1 tablet (4 mg total) by mouth every 8 (eight) hours as needed for nausea or vomiting., Disp: 20 tablet, Rfl: 0   prazosin (MINIPRESS) 2 MG capsule, Take 1 capsule (2 mg total) by mouth at bedtime.,  Disp: 90 capsule, Rfl: 1   QUEtiapine (SEROQUEL) 200 MG tablet, TAKE 1 TABLET BY MOUTH EVERYDAY AT BEDTIME, Disp: 30 tablet, Rfl: 0   valbenazine (INGREZZA) 40 MG capsule, Take 1 capsule (40 mg total) by mouth daily., Disp: 30 capsule, Rfl: 5   hydrocortisone-pramoxine (ANALPRAM HC) 2.5-1 % rectal cream, Place 1 application rectally 3 (three) times daily., Disp: 30 g, Rfl: 3 Medication Side Effects: nausea  Family Medical/ Social History: Changes? No  MENTAL HEALTH EXAM:  There were no vitals taken for this visit.There is no height or weight on file to calculate BMI.  General Appearance: Casual and Well Groomed  Eye Contact:  Good  Speech:  Clear and Coherent and Normal Rate  Volume:  Normal  Mood:  Euthymic  Affect:  Congruent  Thought Process:  Goal Directed and Descriptions of Associations: Circumstantial  Orientation:  Full (Time, Place, and Person)  Thought Content: Logical   Suicidal Thoughts:  No  Homicidal Thoughts:  No  Memory:  Immediate;  Poor Recent;   Poor Remote;   Good  Judgement:  Good  Insight:  Good  Psychomotor Activity:  Abnormal mandibular motion up and down, worse with distraction.  No tongue protrusion or truncal movements noted.    Concentration:  Concentration: Fair and Attention Span: Good  Recall:  Good  Fund of Knowledge: Good  Language: Good  Assets:  Communication Skills Desire for Improvement Financial Resources/Insurance Housing Social Support Transportation  ADL's:  Intact  Cognition: WNL  Prognosis:  Good   DIAGNOSES:    ICD-10-CM   1. Bipolar II disorder (HCC)  F31.81     2. Tardive dyskinesia  G24.01     3. Generalized anxiety disorder  F41.1     4. PTSD (post-traumatic stress disorder)  F43.10       Receiving Psychotherapy: No   RECOMMENDATIONS:  PDMP reviewed.  Ativan filled 12/19/2022. I provided of non-face-to-face time during this encounter, including time spent before and after the visit in records review,  medical decision making, counseling pertinent to today's visit, and charting.   Since she had no improvement on the Austedo (I understand we could increase the dose more however) we agreed to go back to [employer].  What we thought was nausea from that was more likely a combination of Aricept, may be metformin, and GERD.  We agreed to retry the Ingrezza.  They still have probably close to a month's supply from the previous trial and we will go ahead and start that. Restart Ingrezza  Continue Cymbalta 80 mg daily. Continue Haldol 1 mg, 1 p.o. q am, and 2 po qhs. (ok to take an extra 1/2 pill in the middle of the night if she becomes really agitated.  Let me know the next day if they have to do that.) Continue Ativan 1 mg, 1 p.o. nightly but also can take 1/2-1 po during day prn.  (And an extra 1/2 pill during the night if needed.) Continue mirtazapine 7.5 mg, 1 p.o. nightly. Continue prazosin 2 mg p.o. nightly at 8 PM. Continue Seroquel 200 mg, 1 p.o. nightly at 8 PM. Restart Ingrezza 40 mg, 1 p.o. daily. Return in 4 weeks.  Melony Overly, PA-C

## 2023-01-28 DIAGNOSIS — M545 Low back pain, unspecified: Secondary | ICD-10-CM | POA: Diagnosis not present

## 2023-01-29 ENCOUNTER — Other Ambulatory Visit: Payer: Self-pay

## 2023-01-29 ENCOUNTER — Encounter: Payer: Self-pay | Admitting: Physician Assistant

## 2023-01-29 DIAGNOSIS — G2401 Drug induced subacute dyskinesia: Secondary | ICD-10-CM

## 2023-01-29 MED ORDER — VALBENAZINE TOSYLATE 40 MG PO CAPS: 40.0000 mg | ORAL_CAPSULE | Freq: Every day | ORAL | 5 refills | Status: DC

## 2023-01-31 ENCOUNTER — Ambulatory Visit: Payer: Medicare Other | Admitting: Physician Assistant

## 2023-02-01 ENCOUNTER — Other Ambulatory Visit: Payer: Self-pay | Admitting: Physician Assistant

## 2023-02-01 DIAGNOSIS — Z6829 Body mass index (BMI) 29.0-29.9, adult: Secondary | ICD-10-CM | POA: Diagnosis not present

## 2023-02-01 DIAGNOSIS — M5136 Other intervertebral disc degeneration, lumbar region: Secondary | ICD-10-CM | POA: Diagnosis not present

## 2023-02-01 DIAGNOSIS — M545 Low back pain, unspecified: Secondary | ICD-10-CM | POA: Diagnosis not present

## 2023-02-04 DIAGNOSIS — M545 Low back pain, unspecified: Secondary | ICD-10-CM | POA: Diagnosis not present

## 2023-02-06 ENCOUNTER — Other Ambulatory Visit: Payer: Self-pay | Admitting: Physician Assistant

## 2023-02-06 MED ORDER — HALOPERIDOL 1 MG PO TABS: ORAL_TABLET | ORAL | 5 refills | Status: DC

## 2023-02-06 MED ORDER — LORAZEPAM 1 MG PO TABS: ORAL_TABLET | ORAL | 5 refills | Status: DC

## 2023-02-11 ENCOUNTER — Telehealth: Payer: Self-pay | Admitting: Physician Assistant

## 2023-02-11 DIAGNOSIS — M545 Low back pain, unspecified: Secondary | ICD-10-CM | POA: Diagnosis not present

## 2023-02-11 NOTE — Telephone Encounter (Signed)
Called [name] and she said there was an issue getting Austedo. Ingrezza was stopped due to upset stomach. She said you had decreased some meds that might help with the stomach upset and they tried [place] again and the stomach issues recurred. [name] said she sent you a MyChart message directly on ? Friday. Pt sx involve right side and mouth. She is sleeping at night and said she would love to be able to sleep during the day too because the sx are driving her crazy.

## 2023-02-11 NOTE — Telephone Encounter (Signed)
Ok, let me know If I need to send a new Rx for Austedo XR to [place]. That's the end goal here and I think we've jumped through all the required hoops. Thanks.

## 2023-02-11 NOTE — Telephone Encounter (Signed)
This has already been discussed and I am aware of the change. I will be touching base with [place] since we are going back to the previous medication again.   Will update after I speak with pharmacy

## 2023-02-11 NOTE — Telephone Encounter (Signed)
[name]'s daughter, Pincus Large (on [place]) stating her mom is getting much worse since the last time she was seen. She continues to have right leg, foot and mouth jerking and it is totally driving her crazy. [name]'s number is [phone].

## 2023-02-12 ENCOUNTER — Other Ambulatory Visit: Payer: Self-pay

## 2023-02-12 MED ORDER — AUSTEDO XR PATIENT TITRATION 6 & 12 & 24 MG PO TEPK: 12.0000 mg | EXTENDED_RELEASE_TABLET | Freq: Every day | ORAL | 0 refills | Status: DC

## 2023-02-12 NOTE — Telephone Encounter (Signed)
So this is the plan, we reactivated her Austedo XR, Dan Humphreys has a titration pack for her to start again. She does have to start all over. The PA was already previously approved. Dan Humphreys will contact the daughter and explain how the weeks will go. Recommend she try it at bedtime since she has previously had side effects.

## 2023-02-12 NOTE — Telephone Encounter (Signed)
I've communicated with [name], her dtr, by [employer]

## 2023-02-12 NOTE — Telephone Encounter (Signed)
LVM for [name] to [facility].

## 2023-02-12 NOTE — Telephone Encounter (Signed)
Ok, thanks.

## 2023-02-12 NOTE — Telephone Encounter (Signed)
Called [name] to let her know about the Austedo. She said her mom is calling her at least daily and c/o of [name] sx. She is asking for something to help until she can get the Austedo and it has had time to take effect. She has Rx for lorazepam 1 mg to take 1/2 tab qd and 1 at hs, but [name] said it is not helping.   Pharmacy is [facility]

## 2023-02-13 ENCOUNTER — Ambulatory Visit: Payer: Medicare Other | Admitting: Gastroenterology

## 2023-02-19 ENCOUNTER — Other Ambulatory Visit: Payer: Self-pay | Admitting: Physician Assistant

## 2023-02-27 ENCOUNTER — Other Ambulatory Visit: Payer: Self-pay | Admitting: Family Medicine

## 2023-03-04 ENCOUNTER — Other Ambulatory Visit: Payer: Self-pay | Admitting: Physician Assistant

## 2023-03-06 ENCOUNTER — Telehealth: Payer: Self-pay

## 2023-03-06 DIAGNOSIS — G2401 Drug induced subacute dyskinesia: Secondary | ICD-10-CM

## 2023-03-06 MED ORDER — AUSTEDO XR 12 MG PO TB24: 1.0000 | ORAL_TABLET | Freq: Every day | ORAL | 1 refills | Status: DC

## 2023-03-06 MED ORDER — AUSTEDO XR 24 MG PO TB24: 1.0000 | ORAL_TABLET | Freq: Every day | ORAL | 1 refills | Status: DC

## 2023-03-06 NOTE — Telephone Encounter (Signed)
Received a call from [facility] requesting Rx's for Austedo XR 12 mg and 24 mg be sent for pt. Daughter is concerned pt will run out of medication again. Pt is on her 3rd week of Austedo XR titration and daughter reports there is no change with her movements yet. Informed Harlow Asa that pt was past due for her 4 week follow up and to let daughter know that needs to be scheduled in next 2 weeks. Pharmacy requesting Rx's to continue, I will submit those for 1 month supply, then pt can follow up with Rosey Bath and discuss symptoms.

## 2023-03-07 DIAGNOSIS — M5416 Radiculopathy, lumbar region: Secondary | ICD-10-CM | POA: Diagnosis not present

## 2023-03-07 DIAGNOSIS — M5116 Intervertebral disc disorders with radiculopathy, lumbar region: Secondary | ICD-10-CM | POA: Diagnosis not present

## 2023-03-07 NOTE — Telephone Encounter (Signed)
Noted  

## 2023-03-08 ENCOUNTER — Other Ambulatory Visit: Payer: Self-pay | Admitting: Physician Assistant

## 2023-03-14 ENCOUNTER — Encounter: Payer: Self-pay | Admitting: Nurse Practitioner

## 2023-03-14 ENCOUNTER — Ambulatory Visit (INDEPENDENT_AMBULATORY_CARE_PROVIDER_SITE_OTHER): Payer: Medicare Other | Admitting: Nurse Practitioner

## 2023-03-14 VITALS — BP 130/80 | HR 85 | Ht 62.0 in | Wt 162.0 lb

## 2023-03-14 DIAGNOSIS — R110 Nausea: Secondary | ICD-10-CM | POA: Diagnosis not present

## 2023-03-14 DIAGNOSIS — Z86010 Personal history of colonic polyps: Secondary | ICD-10-CM

## 2023-03-14 MED ORDER — NA SULFATE-K SULFATE-MG SULF 17.5-3.13-1.6 GM/177ML PO SOLN: 1.0000 | Freq: Once | ORAL | 0 refills | Status: AC

## 2023-03-14 NOTE — Patient Instructions (Addendum)
You have been scheduled for a colonoscopy. Please follow written instructions given to you at your visit today.  Please pick up your prep supplies at the pharmacy within the next 1-3 days. If you use inhalers (even only as needed), please bring them with you on the day of your procedure.  Restart Famotidine if nausea occurs.  Contact our office if nausea recurs.  Due to recent changes in healthcare laws, you may see the results of your imaging and laboratory studies on MyChart before your provider has had a chance to review them.  We understand that in some cases there may be results that are confusing or concerning to you. Not all laboratory results come back in the same time frame and the provider may be waiting for multiple results in order to interpret others.  Please give [name] 48 hours in order for your provider to thoroughly review all the results before contacting the office for clarification of your results.   Thank you for trusting me with your gastrointestinal care!   Alcide Evener, CRNP

## 2023-03-14 NOTE — Progress Notes (Signed)
03/14/2023 Megan Duncan [id number] Nov 10, 1946   CHIEF COMPLAINT: Nausea   HISTORY OF PRESENT ILLNESS: Megan Duncan is a 76 year old female with a past medical history of bipolar disorder, tardive dyskinesia, arthritis, fibromyalgia, chronic lower back pain, COPD, DM type II, hepatic steatosis, diverticulosis and cecal adenoma. She presents to our office today as referred by Dr. Seabron Duncan for further evaluation for nausea.  She is accompanied by her husband.  She was initially scheduled for an office visit last 01/31/2023 which was cancelled and rescheduled today due to provider illness. Since then, her nausea abated.  Her husband stated they almost canceled today's appointment but decided to proceeding case her nausea recurred.  She reported having nausea without vomiting which started approximately 1 year ago.  She noticed her nausea worsened a bit after she started taking Austedo 4 to 5 months ago. She took Famotidine 20 mg once daily for 2 weeks and ondansetron as needed and her nausea abated 6 weeks ago.  She denies having any heartburn or dysphagia.  No upper or lower abdominal pain. She is passing normal formed brown bowel movement daily.  No rectal bleeding or black stools. She underwent a colonoscopy by Dr. Jarold Duncan 07/16/2012 which identified a 3 to 5 mm flat polyp removed from the cecum, pathology identified a tubular adenoma without dysplasia or malignancy.  Dr. Jarold Duncan also noted the presence of a fixed sigmoid area which made the exam somewhat difficult. A repeat colonoscopy in 5 years was recommended but was not done.  Maternal aunt with history of colon cancer.  CTAP 12/01/2021 identified diverticulosis without evidence of diverticulitis or colon wall thickening or colonic mass.      Latest Ref Rng & Units 12/27/2022    3:38 PM 12/24/2022   12:00 PM 08/01/2022    2:48 PM  CBC  WBC 4.0 - 10.5 K/uL 8.5  6.5  6.8   Hemoglobin 12.0 - 15.0 g/dL 40.9  81.1  91.4    Hematocrit 36.0 - 46.0 % 41.8  41.9  43.6   Platelets 150.0 - 400.0 K/uL 376.0  364.0  299        Latest Ref Rng & Units 12/27/2022    3:38 PM 12/24/2022   12:00 PM 08/01/2022    2:48 PM  CMP  Glucose 70 - 99 mg/dL 99  782  956   BUN 6 - 23 mg/dL 13  15  11    Creatinine 0.40 - 1.20 mg/dL 2.13  0.86  5.78   Sodium 135 - 145 mEq/L 143  143  147   Potassium 3.5 - 5.1 mEq/L 4.7  4.5  4.0   Chloride 96 - 112 mEq/L 103  104  112   CO2 19 - 32 mEq/L 29  27  25    Calcium 8.4 - 10.5 mg/dL 46.9  62.9  9.9   Total Protein 6.0 - 8.3 g/dL 6.9  6.6  7.1   Total Bilirubin 0.2 - 1.2 mg/dL 0.4  0.4  0.4   Alkaline Phos 39 - 117 U/L 54  53  49   AST 0 - 37 U/L 15  17  23    ALT 0 - 35 U/L 10  11  15     CTAP 07/31/2022: CLINICAL DATA:  Lower abdominal pain, nausea   EXAM: CT ABDOMEN AND PELVIS WITH CONTRAST   TECHNIQUE: Multidetector CT imaging of the abdomen and pelvis was performed using the standard protocol following bolus administration of intravenous contrast.  RADIATION DOSE REDUCTION: This exam was performed according to the departmental dose-optimization program which includes automated exposure control, adjustment of the mA and/or kV according to patient size and/or use of iterative reconstruction technique.   CONTRAST:  OMNIPAQUE IOHEXOL 300 MG/ML  SOLN   COMPARISON:  12/01/2021   FINDINGS: Lower chest: Visualized lower lung fields are clear.   Hepatobiliary: There is fatty infiltration in liver. There is no dilation of bile ducts. Gallbladder is not distended.   Pancreas: No focal abnormalities are seen.   Spleen: Unremarkable.   Adrenals/Urinary Tract: There is mild nodularity in left adrenal with no significant change. There is no hydronephrosis. 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 There are few smooth marginated low-density lesions in the kidneys largest measuring 3.8 cm in size in the right kidney. No interval changes are noted. There are no renal or  ureteral stones. Urinary bladder is not distended.   Stomach/Bowel: Stomach is unremarkable. Small bowel loops are not dilated. Appendix is not seen. There is no pericecal inflammation. There is no significant wall thickening in colon. Multiple diverticula are seen in colon without signs of focal acute diverticulitis.   Vascular/Lymphatic: Scattered arterial calcifications are seen. No significant lymphadenopathy is seen.   Reproductive: Uterus is not seen.   Other: There is no ascites or pneumoperitoneum.   Musculoskeletal: Degenerative changes are noted in lumbar spine, more severe at L2-L3 level.   IMPRESSION: There is no evidence of intestinal obstruction or pneumoperitoneum. There is no hydronephrosis.   Fatty liver. Diverticulosis of colon without signs of focal diverticulitis. Renal cysts.   Other findings as described in the body of the report.  ECHO 08/02/2016:    Left ventricle: The cavity size was normal. Wall thickness was    normal. Systolic function was normal. The estimated ejection    fraction was in the range of 55% to 60%. Doppler parameters are    consistent with abnormal left ventricular relaxation (grade 1    diastolic dysfunction).    GI PROCEDURES: Colonoscopy 07/16/2012 by Dr. Jarold Duncan There was severe diverticulosis noted in the descending colon and sigmoid colon Flat polyp ranging between 3 to 5 mm in size was found at the cecum, polypectomy was performed using snare cautery. Fixed sigmoid area made exam somewhat difficult, numerous diverticula noted Surgical [P], cecum, polyp - TUBULAR ADENOMA (ONE FRAGMENT). NO HIGH GRADE DYSPLASIA OR MALIGNANCY IDENTIFIED  Past Medical History:  Diagnosis Date   Acute pain of right shoulder 09/24/2018   Acute upper respiratory infection 09/19/2010   Allergic rhinitis 10/03/2016   Anterolisthesis of lumbar spine    Arthritis    hands   Bilateral hip pain 09/02/2014   Bipolar II disorder    Cellulitis and  abscess of trunk 09/15/2009   Ceruminosis 03/04/2014   Cervicalgia 06/20/2007   Chronic low back pain 06/20/2007   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-sided sciatica 08/06/2012   COPD (chronic obstructive pulmonary disease) (HCC)    Deformity of finger 07/15/2014   Degeneration of lumbar intervertebral disc 08/22/2020   Degenerative lumbar spinal stenosis    Dermatitis 06/02/2015   Diastolic dysfunction    Per pt, diagnosed after [facility]   Diverticulosis    DOE (dyspnea on exertion) 07/20/2016   Essential (primary) hypertension 04/23/2007   Estrogen deficiency 09/01/2017   External hemorrhoid 08/01/2015   Fibromyalgia    Foraminal stenosis of lumbar region 08/22/2020   Generalized anxiety disorder 09/01/2018   Hyperlipidemia    Inflammatory disease of uterus  04/23/2007   Left flank pain 07/13/2021   Left upper quadrant pain 09/01/2017   Lower abdominal pain 08/06/2012   Lumbar back pain with radiculopathy affecting left lower extremity 01/24/2010   Lumbar radiculopathy 01/26/2021   Lump in upper inner quadrant of left breast 07/13/2021   Major depressive disorder    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   Otitis media 09/05/2010   Palpitations 07/20/2016   Peripheral neuropathy 02/22/2010   Retrolisthesis of vertebrae 08/22/2020   Shingles outbreak 04/14/2015   Sinusitis, acute maxillary 09/14/2013   Stress incontinence, female 08/01/2015   Thoracic aortic atherosclerosis 05/14/2017   Tinea corporis 09/01/2017   Tinnitus 03/04/2014   Torticollis 03/04/2014   Type II diabetes mellitus 06/05/2018   Past Surgical History:  Procedure Laterality Date   APPENDECTOMY     EYE SURGERY     KNEE ARTHROSCOPY Right 11/12/2006   TONSILLECTOMY     TOTAL ABDOMINAL HYSTERECTOMY     Social History:  She is married. She has one daughter. She reports that she quit smoking about 18 years ago. Her smoking use included cigarettes. She has a 60.00 pack-year  smoking history. She has been exposed to tobacco smoke. She has never used smokeless tobacco. She reports current alcohol use. She reports that she does not use drugs.  Family History: family history includes Alcoholism in her father; Bipolar disorder in her father and paternal aunt; Breast cancer in her maternal aunt; Colon cancer (age of onset: 95) in her maternal aunt; Diabetes in her father; Heart attack in her father; Hiatal hernia in her mother; Hypertension in her father and mother; Mental illness in her sister; Suicidality in her sister. Paternal grandmother uterine cancer. Father heart disease.   Allergies  Allergen Reactions   Atorvastatin Other (See Comments)    Significant rise in liver tests   Codeine Nausea And Vomiting   Diclofenac Other (See Comments)    Elevated LFT's   Doxycycline     Extreme nausea   Fluconazole Other (See Comments)    Hallucinations    Sulfonamide Derivatives Swelling      Outpatient Encounter Medications as of 03/14/2023  Medication Sig   acetaminophen (TYLENOL) 650 MG CR tablet Take 650 mg by mouth every 8 (eight) hours as needed for pain.   Deutetrabenazine (AUSTEDO XR PATIENT TITRATION) 6 & 12 & 24 MG TEPK Take 12 mg by mouth daily.   Deutetrabenazine ER (AUSTEDO XR) 12 MG TB24 Take 1 tablet by mouth at bedtime.   Deutetrabenazine ER (AUSTEDO XR) 24 MG TB24 Take 1 tablet by mouth at bedtime.   DULoxetine (CYMBALTA) 20 MG capsule TAKE 1 CAPSULE BY MOUTH DAILY. TAKE WITH 60mg  FOR total OF 80mg  DAILY   DULoxetine (CYMBALTA) 60 MG capsule TAKE 1 CAPSULE BY MOUTH DAILY. TAKE WITH 20mg  FOR total OF 80mg  DAILY   haloperidol (HALDOL) 1 MG tablet 1 po q am, 2 po qhs.   hydrocortisone-pramoxine (ANALPRAM HC) 2.5-1 % rectal cream Place 1 application rectally 3 (three) times daily.   Lancets MISC Use as directed once a day.  Dx code: E11.9   levothyroxine (SYNTHROID) 50 MCG tablet TAKE 1 TABLET BY MOUTH EVERY DAY BEFORE breakfast   lidocaine (LIDODERM) 5 %  Place 1 patch onto the skin daily as needed. Remove & Discard patch within 12 hours or as directed by MD   LORazepam (ATIVAN) 1 MG tablet Take 1/2 tab daily as needed and 1 tab at bedtime.   metFORMIN (GLUCOPHAGE) 1000 MG  tablet 1 po qd (Patient taking differently: Take 500 mg by mouth daily with breakfast. 1 po qd)   mirtazapine (REMERON) 7.5 MG tablet TAKE 1 TABLETS BY MOUTH AT BEDTIME DAILY.   omeprazole (PRILOSEC) 20 MG capsule Take 1 capsule (20 mg total) by mouth daily.   ondansetron (ZOFRAN) 4 MG tablet Take 1 tablet (4 mg total) by mouth every 8 (eight) hours as needed for nausea or vomiting.   prazosin (MINIPRESS) 2 MG capsule TAKE 1 CAPSULE BY MOUTH AT BEDTIME   QUEtiapine (SEROQUEL) 200 MG tablet TAKE 1 TABLET BY MOUTH EVERYDAY AT BEDTIME   No facility-administered encounter medications on file as of 03/14/2023.   REVIEW OF SYSTEMS:  Gen: Denies fever, sweats or chills. No weight loss.  CV: Denies chest pain, palpitations or edema. Resp: Denies cough, shortness of breath of hemoptysis.  GI: See HPI. GU : Denies urinary burning, blood in urine, increased urinary frequency or incontinence. MS: + Back pain. Derm: Denies rash, itchiness, skin lesions or unhealing ulcers. Psych: Denies depression, anxiety, memory loss or confusion. Heme: Denies bruising, easy bleeding. Neuro:  Denies headaches, dizziness or paresthesias. Endo:  Denies any problems with DM, thyroid or adrenal function.  PHYSICAL EXAM: BP 130/80   Pulse 85   Ht 5\' 2"  (1.575 m)   Wt 162 lb (73.5 kg)   BMI 29.63 kg/m   General: 77 year old female in no acute distress. Head: Normocephalic and atraumatic. Eyes:  Sclerae non-icteric, conjunctive pink. Ears: Normal auditory acuity. Mouth: Constant chewing motion. Dentition intact. No ulcers or lesions.  Neck: Supple, no lymphadenopathy or thyromegaly.  Lungs: Clear bilaterally to auscultation without wheezes, crackles or rhonchi. Heart: Regular rate and rhythm. No  murmur, rub or gallop appreciated.  Abdomen: Soft, nontender, nondistended. No masses. No hepatosplenomegaly. Normoactive bowel sounds x 4 quadrants.  Rectal: Deferred.  Musculoskeletal: Symmetrical with no gross deformities. Skin: Warm and dry. No rash or lesions on visible extremities. Extremities: No edema. Neurological: Alert oriented x 4, no focal deficits.  Psychological:  Alert and cooperative. Normal mood and affect.  ASSESSMENT AND PLAN:  77 year old female with nausea x 1 year which abated 6 weeks ago after she took Famotidine 20 mg daily x 2 weeks and ondansetron as needed.  No abdominal pain.  Normal CBC and CMP 12/2022. -Patient to contact office if nausea recurs.  Recommend restarting famotidine 20 mg daily, ondansetron 4 mg p.o. every 8 hours as needed and checking CBC, CMP and abdominal sonogram if nausea recurs.  History of a flat tubular adenomatous polyp removed from the cecum per colonoscopy 07/2012.  A fixed sigmoid area which resulted in some difficulty during colonoscopy was noted. Patient wishes to pursue a colon polyp surveillance colonoscopy at this time.   -Colonoscopy tentatively scheduled with Dr. Tomasa Rand 04/25/2023.  Dr. Tomasa Rand to review today's office consult and to further verify if patient okay to proceed with colonoscopy as scheduled.  Hepatic steatosis per CTAP.  Normal LFTs. -Reduce carbohydrates in diet, avoid weight gain  DM type II     CC:  Zola Button, Grayling Congress, *

## 2023-03-15 ENCOUNTER — Telehealth (INDEPENDENT_AMBULATORY_CARE_PROVIDER_SITE_OTHER): Payer: Medicare Other | Admitting: Physician Assistant

## 2023-03-15 ENCOUNTER — Encounter: Payer: Self-pay | Admitting: Physician Assistant

## 2023-03-15 DIAGNOSIS — G2401 Drug induced subacute dyskinesia: Secondary | ICD-10-CM

## 2023-03-15 DIAGNOSIS — F3181 Bipolar II disorder: Secondary | ICD-10-CM

## 2023-03-15 DIAGNOSIS — F411 Generalized anxiety disorder: Secondary | ICD-10-CM | POA: Diagnosis not present

## 2023-03-15 MED ORDER — AUSTEDO XR 24 MG PO TB24
1.0000 | ORAL_TABLET | Freq: Every day | ORAL | 5 refills | Status: DC
Start: 2023-03-15 — End: 2023-05-02

## 2023-03-15 NOTE — Progress Notes (Unsigned)
[facility]  Patient ID: Megan Duncan,  MRN: [id number]  PCP: Donato Schultz, DO  Date of Evaluation: 03/15/2023 Time spent:20 minutes  Chief Complaint:  Chief Complaint   Follow-up   Virtual Visit via Telehealth  I connected with patient by a video enabled telemedicine application  with their informed consent, and verified patient privacy and that I am speaking with the correct person using two identifiers.  I am private, in my office and the patient is at home.  I discussed the limitations, risks, security and privacy concerns of performing an evaluation and management service by video and the availability of in person appointments. I also discussed with the patient that there may be a patient responsible charge related to this service. The patient expressed understanding and agreed to proceed.   I discussed the assessment and treatment plan with the patient. The patient was provided an opportunity to ask questions and all were answered. The patient agreed with the plan and demonstrated an understanding of the instructions.   The patient was advised to call back or seek an in-person evaluation if the symptoms worsen or if the condition fails to improve as anticipated.  I provided 20  minutes of non-face-to-face time during this encounter.  HISTORY/CURRENT STATUS: HPI For routine follow-up.  Has been getting out of bed and going downstairs sometimes now! Hasn't done that in months. But she fell once, she was getting up from sitting position. No injury.  She didn't ask [name] to help her b/c she didn't want to bother him, that's when it happened. Energy and motivation are the same, unable to do much b/c back pain. Personal hygiene is nl. Not crying easily. No SI/HI.  Is more anxious during the day.Feels uneasy, and afraid that something bad is going to happen.  When she takes the 1/2 Xanax during the day she feels better.   Patient denies increased energy with  decreased need for sleep, increased talkativeness, racing thoughts, impulsivity or risky behaviors, increased spending, increased libido, grandiosity, increased irritability or anger, paranoia, or hallucinations.  Has beeen back on Austedo consistently for about a month. They might be able to tell a little difference in the mouth movement. Denies dizziness, syncope, seizures, numbness, tingling, tremor, tics, unsteady gait, slurred speech, confusion. Denies muscle or joint pain, stiffness, or dystonia. Denies unexplained weight loss, frequent infections, or sores that heal slowly.  No polyphagia, polydipsia, or polyuria. Denies visual changes or paresthesias.   Individual Medical History/ Review of Systems: Changes? :Yes  Saw GI yesterday, no nausea since d/c aricept.  Past medications for mental health diagnoses include: Trileptal Lamictal Prozac Zoloft Effexor XR, Cymbalta, Paxil, lithium, Depakote, Latuda, Risperdal, Tegretol, Wellbutrin, propranolol for tremor, Thorazine  Haldol and Zyprexa given for a few days inpatient January 2023, propranolol for lithium tremor, Ingrezza caused nausea  Hospitalized in January 2023 at [facility] for altered mental status with hallucinations, paranoia, and aggressive behavior  Allergies: Atorvastatin, Codeine, Diclofenac, Doxycycline, Fluconazole, and Sulfonamide derivatives  Current Medications:  Current Outpatient Medications:    acetaminophen (TYLENOL) 650 MG CR tablet, Take 650 mg by mouth every 8 (eight) hours as needed for pain., Disp: , Rfl:    Deutetrabenazine ER (AUSTEDO XR) 24 MG TB24, Take 1 tablet by mouth at bedtime., Disp: 30 tablet, Rfl: 5   DULoxetine (CYMBALTA) 20 MG capsule, TAKE 1 CAPSULE BY MOUTH DAILY. TAKE WITH 60mg  FOR total OF 80mg  DAILY, Disp: 90 capsule, Rfl: 1   DULoxetine (  CYMBALTA) 60 MG capsule, TAKE 1 CAPSULE BY MOUTH DAILY. TAKE WITH 20mg  FOR total OF 80mg  DAILY, Disp: 90 capsule, Rfl: 1   haloperidol  (HALDOL) 1 MG tablet, 1 po q am, 2 po qhs., Disp: 90 tablet, Rfl: 5   hydrocortisone-pramoxine (ANALPRAM HC) 2.5-1 % rectal cream, Place 1 application rectally 3 (three) times daily., Disp: 30 g, Rfl: 3   Lancets MISC, Use as directed once a day.  Dx code: E11.9, Disp: 30 each, Rfl: 2   levothyroxine (SYNTHROID) 50 MCG tablet, TAKE 1 TABLET BY MOUTH EVERY DAY BEFORE breakfast, Disp: 90 tablet, Rfl: 1   lidocaine (LIDODERM) 5 %, Place 1 patch onto the skin daily as needed. Remove & Discard patch within 12 hours or as directed by MD (Patient not taking: Reported on 03/14/2023), Disp: 30 patch, Rfl: 0   LORazepam (ATIVAN) 1 MG tablet, Take 1/2 tab daily as needed and 1 tab at bedtime., Disp: 45 tablet, Rfl: 5   metFORMIN (GLUCOPHAGE) 1000 MG tablet, 1 po qd (Patient taking differently: Take 1,000 mg by mouth daily with breakfast. 1 po qd), Disp: 90 tablet, Rfl: 1   mirtazapine (REMERON) 7.5 MG tablet, TAKE 1 TABLETS BY MOUTH AT BEDTIME DAILY., Disp: 180 tablet, Rfl: 1   omeprazole (PRILOSEC) 20 MG capsule, Take 1 capsule (20 mg total) by mouth daily., Disp: 30 capsule, Rfl: 3   ondansetron (ZOFRAN) 4 MG tablet, Take 1 tablet (4 mg total) by mouth every 8 (eight) hours as needed for nausea or vomiting., Disp: 20 tablet, Rfl: 0   prazosin (MINIPRESS) 2 MG capsule, TAKE 1 CAPSULE BY MOUTH AT BEDTIME, Disp: 90 capsule, Rfl: 1   QUEtiapine (SEROQUEL) 200 MG tablet, TAKE 1 TABLET BY MOUTH EVERYDAY AT BEDTIME, Disp: 30 tablet, Rfl: 0 Medication Side Effects: nausea  Family Medical/ Social History: Changes? No  MENTAL HEALTH EXAM:  There were no vitals taken for this visit.There is no height or weight on file to calculate BMI.  General Appearance: Casual and Well Groomed  Eye Contact:  Good  Speech:  Clear and Coherent and Normal Rate  Volume:  Normal  Mood:  Euthymic  Affect:  Congruent  Thought Process:  Goal Directed and Descriptions of Associations: Circumstantial  Orientation:  Full (Time, Place,  and Person)  Thought Content: Logical   Suicidal Thoughts:  No  Homicidal Thoughts:  No  Memory:  Immediate;   Poor Recent;   Poor Remote;   Good  Judgement:  Good  Insight:  Good  Psychomotor Activity:  mandibular motion up and down has improved, not as pronounced  Concentration:  Concentration: Fair and Attention Span: Good  Recall:  Good  Fund of Knowledge: Good  Language: Good  Assets:  Communication Skills Desire for Improvement Financial Resources/Insurance Housing Social Support Transportation  ADL's:  Intact  Cognition: WNL  Prognosis:  Good   DIAGNOSES:    ICD-10-CM   1. Bipolar II disorder (HCC)  F31.81     2. Tardive dyskinesia  G24.01 Deutetrabenazine ER (AUSTEDO XR) 24 MG TB24    3. Generalized anxiety disorder  F41.1       Receiving Psychotherapy: No   RECOMMENDATIONS:  PDMP reviewed.  Ativan filled 03/07/2023. I provided 20 minutes of non-face-to-face time during this encounter, including time spent before and after the visit in records review, medical decision making, counseling pertinent to today's visit, and charting.   She's slightly better with [name], cont same for now. Consider increasing dose at next OV if  TD still a prob.  Will have her take Ativan mid-day to help with anxiety mid-day. She will let her husband know when she wants to go downstairs so he can help her, hopefully prevent another fall.   Continue Austedo XR 24 mg daily.  Continue Cymbalta 80 mg daily. Continue Haldol 1 mg, 1 p.o. q am, and 2 po qhs. (ok to take an extra 1/2 pill in the middle of the night if she becomes really agitated.  Let me know the next day if they have to do that.) Continue Ativan 1 mg, 1/2 po during day, and 1 at hs. (Both routine now.) Continue mirtazapine 7.5 mg, 1 p.o. nightly. Continue prazosin 2 mg p.o. nightly at 8 PM. Continue Seroquel 200 mg, 1 p.o. nightly at 8 PM. Return in 6 weeks.  Melony Overly, PA-C

## 2023-03-18 ENCOUNTER — Telehealth: Payer: Self-pay | Admitting: Physician Assistant

## 2023-03-18 NOTE — Telephone Encounter (Signed)
Daughter, [name], called confused about the dosing of the Austedo.  At week 4 she was taking 6mg  + 24mg .  At week 5 (now) the pharmacy gave her 12mg  + 24mg .  But you said she should just be ing 24mg . Please call to verify the dose [name] should be on now. [phone]

## 2023-03-18 NOTE — Telephone Encounter (Signed)
It should be 24 mg daily. Sorry for the confusion.

## 2023-03-18 NOTE — Telephone Encounter (Signed)
Please see message from [place]. I know patient has gone from Austedo to [place] and now back to [place] and there was difficulty getting it from the pharmacy. I think she had to taper up again, which would account for the 6, 12, and 24. Your note says 24 mg. Please clarify what she should be taking.

## 2023-03-19 NOTE — Telephone Encounter (Signed)
[name] informed that correct dose is 24 mg per TH.

## 2023-03-20 ENCOUNTER — Telehealth: Payer: Self-pay | Admitting: Physician Assistant

## 2023-03-20 NOTE — Telephone Encounter (Signed)
I spoke with [name], [name]'s daughter, concerning the confusion about the Austedo dose.  The month sample pack had her at 30 mg on week 4.  However it could have caused some increased anxiety, so I recommend she stay at 24 mg (which was advised yesterday or the day before) and we can increase to 30 mg in a few weeks if the current dose is not helping with abnormal mouth movements even more than it has been.  We will have to take into consideration her anxiety level though.  [name] will call with an update if needed before her next visit.

## 2023-03-21 ENCOUNTER — Telehealth: Payer: Self-pay

## 2023-03-21 NOTE — Telephone Encounter (Signed)
Author: Arnaldo Natal, NP Service: Gastroenterology Author Type: Nurse Practitioner  Filed: 03/21/2023  3:41 PM Encounter Date: 03/14/2023 Status: Signed  Editor: Arnaldo Natal, NP (Nurse Practitioner)   Megan Duncan, please let patient know Dr. Tomasa Rand reviewed her office visit and he is in agreement for the patient to proceed with colonoscopy as scheduled.  However, she should contact her office if she develops any new changes in her health status prior to her colonoscopy date.  Thank you     Spoke with patient's husband & he is aware of the plan and recommendations.

## 2023-03-21 NOTE — Progress Notes (Signed)
Megan Duncan, please let patient know Dr. Tomasa Rand reviewed her office visit and he is in agreement for the patient to proceed with colonoscopy as scheduled.  However, she should contact her office if she develops any new changes in her health status prior to her colonoscopy date.  Thank you

## 2023-03-25 ENCOUNTER — Other Ambulatory Visit: Payer: Self-pay | Admitting: Physician Assistant

## 2023-03-29 ENCOUNTER — Other Ambulatory Visit: Payer: Self-pay

## 2023-03-29 MED ORDER — AUSTEDO XR 6 MG PO TB24: 1.0000 | ORAL_TABLET | Freq: Every day | ORAL | 1 refills | Status: AC

## 2023-03-29 MED ORDER — AUSTEDO XR 12 MG PO TB24: 1.0000 | ORAL_TABLET | Freq: Every day | ORAL | 1 refills | Status: DC

## 2023-03-29 NOTE — Telephone Encounter (Signed)
Pt is currently taking Austedo XR 24 mg, daughter reports her movements are not improved therefore after discussion with daughter, Corporate treasurer and [place], [place] pt will increase by 6 mg for week 4. Total of 30 mg for 7 days, then increase an additional 6 mg. Total of 36 mg at week 5. Instructed for pt/family to contact office with an update of improvements, side effects, or questions.   Pt's next apt is 04/29/23

## 2023-04-02 DIAGNOSIS — M545 Low back pain, unspecified: Secondary | ICD-10-CM | POA: Diagnosis not present

## 2023-04-09 ENCOUNTER — Other Ambulatory Visit: Payer: Self-pay | Admitting: Physician Assistant

## 2023-04-11 DIAGNOSIS — M545 Low back pain, unspecified: Secondary | ICD-10-CM | POA: Diagnosis not present

## 2023-04-15 ENCOUNTER — Telehealth: Payer: Self-pay

## 2023-04-15 NOTE — Telephone Encounter (Signed)
Caller is asking for call back for a status on Surgical Clearance forms that were faxed to [name]. Please advise.

## 2023-04-16 DIAGNOSIS — M545 Low back pain, unspecified: Secondary | ICD-10-CM | POA: Diagnosis not present

## 2023-04-16 NOTE — Telephone Encounter (Signed)
Advised that we have received and that we are just waiting for provider to review.

## 2023-04-19 ENCOUNTER — Other Ambulatory Visit: Payer: Self-pay | Admitting: Physician Assistant

## 2023-04-19 NOTE — Telephone Encounter (Signed)
Just received form back from provider.  Form faxed and confirmation received.

## 2023-04-23 DIAGNOSIS — M545 Low back pain, unspecified: Secondary | ICD-10-CM | POA: Diagnosis not present

## 2023-04-25 ENCOUNTER — Ambulatory Visit (AMBULATORY_SURGERY_CENTER): Payer: Medicare Other | Admitting: Gastroenterology

## 2023-04-25 ENCOUNTER — Encounter: Payer: Self-pay | Admitting: Gastroenterology

## 2023-04-25 VITALS — BP 154/85 | HR 81 | Temp 97.5°F | Resp 19 | Ht 62.0 in | Wt 162.0 lb

## 2023-04-25 DIAGNOSIS — K635 Polyp of colon: Secondary | ICD-10-CM | POA: Diagnosis not present

## 2023-04-25 DIAGNOSIS — D123 Benign neoplasm of transverse colon: Secondary | ICD-10-CM

## 2023-04-25 DIAGNOSIS — Z09 Encounter for follow-up examination after completed treatment for conditions other than malignant neoplasm: Secondary | ICD-10-CM

## 2023-04-25 DIAGNOSIS — D122 Benign neoplasm of ascending colon: Secondary | ICD-10-CM | POA: Diagnosis not present

## 2023-04-25 DIAGNOSIS — D128 Benign neoplasm of rectum: Secondary | ICD-10-CM | POA: Diagnosis not present

## 2023-04-25 DIAGNOSIS — Z86010 Personal history of colonic polyps: Secondary | ICD-10-CM

## 2023-04-25 DIAGNOSIS — F319 Bipolar disorder, unspecified: Secondary | ICD-10-CM | POA: Diagnosis not present

## 2023-04-25 DIAGNOSIS — M797 Fibromyalgia: Secondary | ICD-10-CM | POA: Diagnosis not present

## 2023-04-25 DIAGNOSIS — J449 Chronic obstructive pulmonary disease, unspecified: Secondary | ICD-10-CM | POA: Diagnosis not present

## 2023-04-25 MED ORDER — SODIUM CHLORIDE 0.9 % IV SOLN
500.0000 mL | Freq: Once | INTRAVENOUS | Status: DC
Start: 2023-04-25 — End: 2023-04-25

## 2023-04-25 NOTE — Patient Instructions (Signed)

## 2023-04-25 NOTE — Op Note (Signed)
[facility] Patient Name: Megan Duncan Procedure Date: 04/25/2023 11:00 AM MRN: [id number] Endoscopist: Lorin Picket E. Tomasa Rand , MD, [id number] Age: 77 Referring MD:  Date of Birth: December 15, 1945 Gender: Female Account #: [id number] Procedure:                Colonoscopy Indications:              Surveillance: Personal history of adenomatous                            polyps on last colonoscopy > 5 years ago Medicines:                Monitored Anesthesia Care Procedure:                Pre-Anesthesia Assessment:                           - Prior to the procedure, a History and Physical                            was performed, and patient medications and                            allergies were reviewed. The patient's tolerance of                            previous anesthesia was also reviewed. The risks                            and benefits of the procedure and the sedation                            options and risks were discussed with the patient.                            All questions were answered, and informed consent                            was obtained. Prior Anticoagulants: The patient has                            taken no anticoagulant or antiplatelet agents. ASA                            Grade Assessment: III - A patient with severe                            systemic disease. After reviewing the risks and                            benefits, the patient was deemed in satisfactory                            condition to undergo the procedure.  After obtaining informed consent, the colonoscope                            was passed under direct vision. Throughout the                            procedure, the patient's blood pressure, pulse, and                            oxygen saturations were monitored continuously. The                            CF [id number] #[id number] was introduced through the anus                            and  advanced to the the terminal ileum, with                            identification of the appendiceal orifice and IC                            valve. The colonoscopy was somewhat difficult due                            to restricted mobility of the colon and a tortuous                            colon. The patient tolerated the procedure well.                            The quality of the bowel preparation was good. The                            terminal ileum, ileocecal valve, appendiceal                            orifice, and rectum were photographed. The bowel                            preparation used was SUPREP via split dose                            instruction. Scope In: 11:34:13 AM Scope Out: 12:03:20 PM Scope Withdrawal Time: 0 hours 23 minutes 12 seconds  Total Procedure Duration: 0 hours 29 minutes 7 seconds  Findings:                 The perianal and digital rectal examinations were                            normal. Pertinent negatives include normal                            sphincter tone and no palpable rectal lesions.  A 5 mm polyp was found in the ascending colon. The                            polyp was sessile. The polyp was removed with a                            cold snare. Resection and retrieval were complete.                            Estimated blood loss was minimal.                           An 8 mm polyp was found in the distal transverse                            colon. The polyp was flat. The polyp was removed                            with a saline injection-lift technique using a cold                            snare. Resection and retrieval were complete.                            Estimated blood loss was minimal. Estimated blood                            loss was minimal.                           A 3 mm polyp was found in the proximal rectum. The                            polyp was sessile. The polyp was removed  with a                            cold snare. Resection and retrieval were complete.                            Estimated blood loss was minimal.                           Multiple medium-mouthed and small-mouthed                            diverticula were found in the sigmoid colon,                            descending colon, transverse colon and cecum.                           The exam was otherwise normal throughout the  examined colon.                           The terminal ileum appeared normal.                           The retroflexed view of the distal rectum and anal                            verge was normal and showed no anal or rectal                            abnormalities. Complications:            No immediate complications. Estimated Blood Loss:     Estimated blood loss was minimal. Estimated blood                            loss was minimal. Impression:               - One 5 mm polyp in the ascending colon, removed                            with a cold snare. Resected and retrieved.                           - One 8 mm polyp in the distal transverse colon,                            removed using injection-lift and a cold snare.                            Resected and retrieved.                           - One 3 mm polyp in the proximal rectum, removed                            with a cold snare. Resected and retrieved.                           - Diverticulosis in the sigmoid colon, in the                            descending colon, in the transverse colon and in                            the cecum.                           - The examined portion of the ileum was normal.                           - The distal rectum and anal verge are normal on  retroflexion view. Recommendation:           - Patient has a contact number available for                            emergencies. The signs and symptoms of potential                             delayed complications were discussed with the                            patient. Return to normal activities tomorrow.                            Written discharge instructions were provided to the                            patient.                           - Resume previous diet.                           - Continue present medications.                           - Await pathology results.                           - Repeat colonoscopy (date not yet determined) for                            surveillance based on pathology results.                           - If no advanced histologic features noted on the                            resected polyps, I would recommend against any                            further colon cancer screening. Rockie Vawter E. Tomasa Rand, MD 04/25/2023 12:16:16 PM This report has been signed electronically.

## 2023-04-25 NOTE — Progress Notes (Signed)
Called to room to assist during endoscopic procedure.  Patient ID and intended procedure confirmed with present staff. Received instructions for my participation in the procedure from the performing physician.  

## 2023-04-25 NOTE — Progress Notes (Signed)
[facility] History and Physical   Primary Care Physician:  Zola Button, Grayling Congress, DO   Reason for Procedure:   Colon cancer screening/polyp surveillance  Plan:    Colonoscopy     HPI: Megan Duncan is a 77 y.o. female undergoing screening colonoscopy.  She has no family history of colon cancer and no chronic GI symptoms.  She had a colonoscopy in 2013 which was technically difficult and a small cecal TA was removed.   Past Medical History:  Diagnosis Date   Acute pain of right shoulder 09/24/2018   Acute upper respiratory infection 09/19/2010   Allergic rhinitis 10/03/2016   Anterolisthesis of lumbar spine    Arthritis    hands   Bilateral hip pain 09/02/2014   Bipolar II disorder    Cellulitis and abscess of trunk 09/15/2009   Ceruminosis 03/04/2014   Cervicalgia 06/20/2007   Chronic low back pain 06/20/2007   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-sided sciatica 08/06/2012   COPD (chronic obstructive pulmonary disease) (HCC)    Deformity of finger 07/15/2014   Degeneration of lumbar intervertebral disc 08/22/2020   Degenerative lumbar spinal stenosis    Dermatitis 06/02/2015   Diastolic dysfunction    Per pt, diagnosed after [facility]   Diverticulosis    DOE (dyspnea on exertion) 07/20/2016   Essential (primary) hypertension 04/23/2007   Estrogen deficiency 09/01/2017   External hemorrhoid 08/01/2015   Fibromyalgia    Foraminal stenosis of lumbar region 08/22/2020   Generalized anxiety disorder 09/01/2018   Hyperlipidemia    Inflammatory disease of uterus 04/23/2007   Left flank pain 07/13/2021   Left upper quadrant pain 09/01/2017   Lower abdominal pain 08/06/2012   Lumbar back pain with radiculopathy affecting left lower extremity 01/24/2010   Lumbar radiculopathy 01/26/2021   Lump in upper inner quadrant of left breast 07/13/2021   Major depressive disorder    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   Otitis media  09/05/2010   Palpitations 07/20/2016   Peripheral neuropathy 02/22/2010   Retrolisthesis of vertebrae 08/22/2020   Shingles outbreak 04/14/2015   Sinusitis, acute maxillary 09/14/2013   Stress incontinence, female 08/01/2015   Thoracic aortic atherosclerosis 05/14/2017   Tinea corporis 09/01/2017   Tinnitus 03/04/2014   Torticollis 03/04/2014   Type II diabetes mellitus 06/05/2018    Past Surgical History:  Procedure Laterality Date   APPENDECTOMY     EYE SURGERY     KNEE ARTHROSCOPY Right 11/12/2006   lumbar spine proceedure     TONSILLECTOMY     TOTAL ABDOMINAL HYSTERECTOMY      Prior to Admission medications   Medication Sig Start Date End Date Taking? Authorizing Provider  acetaminophen (TYLENOL) 650 MG CR tablet Take 650 mg by mouth every 8 (eight) hours as needed for pain.    [provider]  Deutetrabenazine ER (AUSTEDO XR) 12 MG TB24 Take 1 tablet by mouth daily. 03/29/23   Cherie Ouch, PA-C  Deutetrabenazine ER (AUSTEDO XR) 24 MG TB24 Take 1 tablet by mouth at bedtime. 03/15/23   Melony Overly T, PA-C  DULoxetine (CYMBALTA) 20 MG capsule TAKE 1 CAPSULE BY MOUTH DAILY. TAKE WITH 60mg  FOR total OF 80 MG DAILY 04/19/23   Hurst, Rosey Bath T, PA-C  DULoxetine (CYMBALTA) 60 MG capsule TAKE 1 CAPSULE BY MOUTH DAILY. TAKE WITH 20mg  FOR total OF 80mg  DAILY 04/19/23   Melony Overly T, PA-C  haloperidol (HALDOL) 1 MG tablet 1 po q am, 2 po qhs. 02/06/23  Cherie Ouch, PA-C  hydrocortisone-pramoxine (ANALPRAM HC) 2.5-1 % rectal cream Place 1 application rectally 3 (three) times daily. 08/01/15   Sheliah Hatch, MD  Lancets MISC Use as directed once a day.  Dx code: E11.9 01/11/20   Zola Button, Grayling Congress, DO  levothyroxine (SYNTHROID) 50 MCG tablet TAKE 1 TABLET BY MOUTH EVERY DAY BEFORE breakfast 02/28/23   Zola Button, Yvonne R, DO  lidocaine (LIDODERM) 5 % Place 1 patch onto the skin daily as needed. Remove & Discard patch within 12 hours or as directed by MD Patient not  taking: Reported on 03/14/2023 12/22/21   Sarina Ill, DO  LORazepam (ATIVAN) 1 MG tablet Take 1/2 tab daily as needed and 1 tab at bedtime. 02/06/23   Cherie Ouch, PA-C  metFORMIN (GLUCOPHAGE) 1000 MG tablet 1 po qd Patient taking differently: Take 1,000 mg by mouth daily with breakfast. 1 po qd 12/27/22   Zola Button, Myrene Buddy R, DO  mirtazapine (REMERON) 7.5 MG tablet TAKE 1 TABLETS BY MOUTH AT BEDTIME DAILY. 09/26/22   Cherie Ouch, PA-C  omeprazole (PRILOSEC) 20 MG capsule Take 1 capsule (20 mg total) by mouth daily. 12/27/22   Donato Schultz, DO  ondansetron (ZOFRAN) 4 MG tablet Take 1 tablet (4 mg total) by mouth every 8 (eight) hours as needed for nausea or vomiting. 12/27/22   Zola Button, Grayling Congress, DO  prazosin (MINIPRESS) 2 MG capsule TAKE 1 CAPSULE BY MOUTH AT BEDTIME 03/06/23   Hurst, Rosey Bath T, PA-C  QUEtiapine (SEROQUEL) 200 MG tablet TAKE 1 TABLET BY MOUTH EVERYDAY AT BEDTIME 03/26/23   Cherie Ouch, PA-C    Current Outpatient Medications  Medication Sig Dispense Refill   acetaminophen (TYLENOL) 650 MG CR tablet Take 650 mg by mouth every 8 (eight) hours as needed for pain.     Deutetrabenazine ER (AUSTEDO XR) 12 MG TB24 Take 1 tablet by mouth daily. 30 tablet 1   Deutetrabenazine ER (AUSTEDO XR) 24 MG TB24 Take 1 tablet by mouth at bedtime. 30 tablet 5   DULoxetine (CYMBALTA) 20 MG capsule TAKE 1 CAPSULE BY MOUTH DAILY. TAKE WITH 60mg  FOR total OF 80 MG DAILY 90 capsule 1   DULoxetine (CYMBALTA) 60 MG capsule TAKE 1 CAPSULE BY MOUTH DAILY. TAKE WITH 20mg  FOR total OF 80mg  DAILY 90 capsule 1   haloperidol (HALDOL) 1 MG tablet 1 po q am, 2 po qhs. 90 tablet 5   hydrocortisone-pramoxine (ANALPRAM HC) 2.5-1 % rectal cream Place 1 application rectally 3 (three) times daily. 30 g 3   Lancets MISC Use as directed once a day.  Dx code: E11.9 30 each 2   levothyroxine (SYNTHROID) 50 MCG tablet TAKE 1 TABLET BY MOUTH EVERY DAY BEFORE breakfast 90 tablet 1   lidocaine  (LIDODERM) 5 % Place 1 patch onto the skin daily as needed. Remove & Discard patch within 12 hours or as directed by MD (Patient not taking: Reported on 03/14/2023) 30 patch 0   LORazepam (ATIVAN) 1 MG tablet Take 1/2 tab daily as needed and 1 tab at bedtime. 45 tablet 5   metFORMIN (GLUCOPHAGE) 1000 MG tablet 1 po qd (Patient taking differently: Take 1,000 mg by mouth daily with breakfast. 1 po qd) 90 tablet 1   mirtazapine (REMERON) 7.5 MG tablet TAKE 1 TABLETS BY MOUTH AT BEDTIME DAILY. 180 tablet 1   omeprazole (PRILOSEC) 20 MG capsule Take 1 capsule (20 mg total) by mouth daily. 30 capsule 3  ondansetron (ZOFRAN) 4 MG tablet Take 1 tablet (4 mg total) by mouth every 8 (eight) hours as needed for nausea or vomiting. 20 tablet 0   prazosin (MINIPRESS) 2 MG capsule TAKE 1 CAPSULE BY MOUTH AT BEDTIME 90 capsule 1   QUEtiapine (SEROQUEL) 200 MG tablet TAKE 1 TABLET BY MOUTH EVERYDAY AT BEDTIME 90 tablet 0   Current Facility-Administered Medications  Medication Dose Route Frequency Provider Last Rate Last Admin   0.9 %  sodium chloride infusion  500 mL Intravenous Once Jenel Lucks, MD        Allergies as of 04/25/2023 - Review Complete 04/25/2023  Allergen Reaction Noted   Atorvastatin Other (See Comments) 02/02/2016   Codeine Nausea And Vomiting 11/21/2011   Diclofenac Other (See Comments) 01/14/2015   Doxycycline  07/07/2012   Fluconazole Other (See Comments) 06/13/2022   Sulfonamide derivatives Swelling     Family History  Problem Relation Age of Onset   Hypertension Mother    Hiatal hernia Mother    Diabetes Father    Hypertension Father    Heart attack Father    Alcoholism Father    Bipolar disorder Father    Mental illness Sister    Suicidality Sister    Colon cancer Maternal Aunt 20   Breast cancer Maternal Aunt    Bipolar disorder Paternal Aunt     Social History   Socioeconomic History   Marital status: Married    Spouse name: Ahrianna Siglin   Number of  children: 3   Years of education: 14   Highest education level: Some college, no degree  Occupational History   Occupation: retired  Tobacco Use   Smoking status: Former    Packs/day: 1.50    Years: 40.00    Additional pack years: 0.00    Total pack years: 60.00    Types: Cigarettes    Quit date: 07/02/2004    Years since quitting: 18.8    Passive exposure: Past   Smokeless tobacco: Never   Tobacco comments:    Verified by Adrian Saran (Husband), & Nelia Shi (Daughter)  Vaping Use   Vaping Use: Never used  Substance and Sexual Activity   Alcohol use: Yes    Alcohol/week: 0.0 - 1.0 standard drinks of alcohol    Comment: occasional wine   Drug use: No   Sexual activity: Never  Other Topics Concern   Not on file  Social History Narrative   Lives with husband and grandson she is raising.   11/07/21 Lives with husband   Social Determinants of Health   Financial Resource Strain: Medium Risk (11/29/2021)   Overall Financial Resource Strain (CARDIA)    Difficulty of Paying Living Expenses: Somewhat hard  Food Insecurity: No Food Insecurity (12/28/2022)   Hunger Vital Sign    Worried About Running Out of Food in the Last Year: Never true    Ran Out of Food in the Last Year: Never true  Transportation Needs: No Transportation Needs (12/28/2022)   PRAPARE - Administrator, Civil Service (Medical): No    Lack of Transportation (Non-Medical): No  Physical Activity: Inactive (11/29/2021)   Exercise Vital Sign    Days of Exercise per Week: 0 days    Minutes of Exercise per Session: 0 min  Stress: No Stress Concern Present (11/29/2021)   [employer] of Occupational Health - Occupational Stress Questionnaire    Feeling of Stress : Not at all  Social Connections: Socially Integrated (11/29/2021)   Social  Connection and Isolation Panel [NHANES]    Frequency of Communication with Friends and Family: More than three times a week    Frequency of Social Gatherings with  Friends and Family: More than three times a week    Attends Religious Services: 1 to 4 times per year    Active Member of [employer] or Organizations: Yes    Attends Engineer, structural: More than 4 times per year    Marital Status: Married  Catering manager Violence: Not At Risk (11/29/2021)   Humiliation, Afraid, Rape, and Kick questionnaire    Fear of Current or Ex-Partner: No    Emotionally Abused: No    Physically Abused: No    Sexually Abused: No    Review of Systems:  All other review of systems negative except as mentioned in the HPI.  Physical Exam: Vital signs BP (!) 149/89   Pulse 89   Temp (!) 97.5 F (36.4 C) (Temporal)   Ht 5\' 2"  (1.575 m)   Wt 162 lb (73.5 kg)   SpO2 95%   BMI 29.63 kg/m   General:   Alert,  Well-developed, well-nourished, pleasant and cooperative in NAD Airway:  Mallampati 2 Lungs:  Clear throughout to auscultation.   Heart:  Regular rate and rhythm; no murmurs, clicks, rubs,  or gallops. Abdomen:  Soft, nontender and nondistended. Normal bowel sounds.   Neuro/Psych:  Normal mood and affect. A and O x 3   Azeem Poorman E. Tomasa Rand, MD [facility]

## 2023-04-26 ENCOUNTER — Telehealth: Payer: Self-pay | Admitting: *Deleted

## 2023-04-26 NOTE — Telephone Encounter (Signed)
Follow up call attempt.  LVM to call if any questions or concerns. 

## 2023-04-29 ENCOUNTER — Encounter: Payer: Self-pay | Admitting: Physician Assistant

## 2023-04-29 ENCOUNTER — Ambulatory Visit (INDEPENDENT_AMBULATORY_CARE_PROVIDER_SITE_OTHER): Payer: Medicare Other | Admitting: Physician Assistant

## 2023-04-29 DIAGNOSIS — G2401 Drug induced subacute dyskinesia: Secondary | ICD-10-CM

## 2023-04-29 DIAGNOSIS — F4310 Post-traumatic stress disorder, unspecified: Secondary | ICD-10-CM | POA: Diagnosis not present

## 2023-04-29 DIAGNOSIS — K117 Disturbances of salivary secretion: Secondary | ICD-10-CM | POA: Diagnosis not present

## 2023-04-29 DIAGNOSIS — F411 Generalized anxiety disorder: Secondary | ICD-10-CM

## 2023-04-29 DIAGNOSIS — F3181 Bipolar II disorder: Secondary | ICD-10-CM | POA: Diagnosis not present

## 2023-04-29 MED ORDER — AUSTEDO XR 12 MG PO TB24: 1.0000 | ORAL_TABLET | Freq: Every day | ORAL | 0 refills | Status: DC

## 2023-04-29 MED ORDER — AUSTEDO XR 6 MG PO TB24: 6.0000 mg | ORAL_TABLET | Freq: Every day | ORAL | 0 refills | Status: DC

## 2023-04-29 NOTE — Patient Instructions (Signed)
Austedo XR take 6 mg+12 mg+ 24 mg=42 mg daily for 2 weeks, then may increase to 48 mg.

## 2023-04-29 NOTE — Progress Notes (Signed)
[facility]  Patient ID: Megan Duncan,  MRN: [id number]  PCP: Donato Schultz, DO  Date of Evaluation: 04/29/2023 Time spent: 32 mins  Chief Complaint:  Chief Complaint   Follow-up    HISTORY/CURRENT STATUS: HPI For routine follow-up. Orvilla Fus, husband, and [name], dtr are with her.   Karen Kitchens is doing well as far as her mood goes. She's been going downstairs to the living room now and spending time w/ fm. Has been doing it for several months. Before then, she stayed in her bedroom and in bed most of the time for years. The back pain is still a problem and limits her ability to do things. Energy is the same. Not crying easily. Hygiene is nl. No SI/HI.  The daytime anxiety is better with the added Xanax during the day. Not having PA, but still feels overwhelmed sometimes.   Patient denies increased energy with decreased need for sleep, increased talkativeness, racing thoughts, impulsivity or risky behaviors, increased spending, increased libido, grandiosity, increased irritability or anger, paranoia, or hallucinations.  Review of Systems  Constitutional:  Positive for malaise/fatigue.  HENT: Negative.    Eyes: Negative.   Respiratory: Negative.    Cardiovascular: Negative.   Gastrointestinal: Negative.   Genitourinary: Negative.   Musculoskeletal:  Positive for back pain.  Skin: Negative.   Neurological:        Chin still shakes. Improved but still occurs and is bothersome. Not biting her jaws or tongue. She is drooling. Not sure if related or not.   Endo/Heme/Allergies: Negative.   Psychiatric/Behavioral:         See HPI   Individual Medical History/ Review of Systems: Changes? :No    Past medications for mental health diagnoses include: Trileptal Lamictal Prozac Zoloft Effexor XR, Cymbalta, Paxil, lithium, Depakote, Latuda, Risperdal, Tegretol, Wellbutrin, propranolol for tremor, Thorazine  Haldol and Zyprexa given for a few days inpatient January 2023,  propranolol for lithium tremor, Ingrezza caused nausea  Hospitalized in January 2023 at [facility] behavioral [facility] for altered mental status with hallucinations, paranoia, and aggressive behavior  Allergies: Atorvastatin, Codeine, Diclofenac, Doxycycline, Fluconazole, and Sulfonamide derivatives  Current Medications:  Current Outpatient Medications:    acetaminophen (TYLENOL) 650 MG CR tablet, Take 650 mg by mouth every 8 (eight) hours as needed for pain., Disp: , Rfl:    Deutetrabenazine ER (AUSTEDO XR) 6 MG TB24, Take 6 mg by mouth daily. Add to 36 mg=42 mg, Disp: 30 tablet, Rfl: 0   DULoxetine (CYMBALTA) 20 MG capsule, TAKE 1 CAPSULE BY MOUTH DAILY. TAKE WITH 60mg  FOR total OF 80 MG DAILY, Disp: 90 capsule, Rfl: 1   DULoxetine (CYMBALTA) 60 MG capsule, TAKE 1 CAPSULE BY MOUTH DAILY. TAKE WITH 20mg  FOR total OF 80mg  DAILY, Disp: 90 capsule, Rfl: 1   haloperidol (HALDOL) 1 MG tablet, 1 po q am, 2 po qhs., Disp: 90 tablet, Rfl: 5   hydrocortisone-pramoxine (ANALPRAM HC) 2.5-1 % rectal cream, Place 1 application rectally 3 (three) times daily., Disp: 30 g, Rfl: 3   Lancets MISC, Use as directed once a day.  Dx code: E11.9, Disp: 30 each, Rfl: 2   levothyroxine (SYNTHROID) 50 MCG tablet, TAKE 1 TABLET BY MOUTH EVERY DAY BEFORE breakfast, Disp: 90 tablet, Rfl: 1   LORazepam (ATIVAN) 1 MG tablet, Take 1/2 tab daily as needed and 1 tab at bedtime., Disp: 45 tablet, Rfl: 5   metFORMIN (GLUCOPHAGE) 1000 MG tablet, 1 po qd (Patient taking differently: Take 500 mg by mouth  daily with breakfast. 1 po qd), Disp: 90 tablet, Rfl: 1   mirtazapine (REMERON) 7.5 MG tablet, TAKE 1 TABLETS BY MOUTH AT BEDTIME DAILY., Disp: 180 tablet, Rfl: 1   prazosin (MINIPRESS) 2 MG capsule, TAKE 1 CAPSULE BY MOUTH AT BEDTIME, Disp: 90 capsule, Rfl: 1   QUEtiapine (SEROQUEL) 200 MG tablet, TAKE 1 TABLET BY MOUTH EVERYDAY AT BEDTIME, Disp: 90 tablet, Rfl: 0   Deutetrabenazine ER (AUSTEDO XR) 12 MG TB24, Take 1 tablet  by mouth daily. Add to other doses=42 mg daily, Disp: 30 tablet, Rfl: 0   Deutetrabenazine ER (AUSTEDO XR) 24 MG TB24, Take 1 tablet by mouth at bedtime., Disp: 30 tablet, Rfl: 5   lidocaine (LIDODERM) 5 %, Place 1 patch onto the skin daily as needed. Remove & Discard patch within 12 hours or as directed by MD (Patient not taking: Reported on 03/14/2023), Disp: 30 patch, Rfl: 0   omeprazole (PRILOSEC) 20 MG capsule, Take 1 capsule (20 mg total) by mouth daily. (Patient not taking: Reported on 04/25/2023), Disp: 30 capsule, Rfl: 3   ondansetron (ZOFRAN) 4 MG tablet, Take 1 tablet (4 mg total) by mouth every 8 (eight) hours as needed for nausea or vomiting. (Patient not taking: Reported on 04/25/2023), Disp: 20 tablet, Rfl: 0 Medication Side Effects: nausea  Family Medical/ Social History: Changes? No  MENTAL HEALTH EXAM:  There were no vitals taken for this visit.There is no height or weight on file to calculate BMI.  General Appearance: Casual and Well Groomed  Eye Contact:  Good  Speech:  Clear and Coherent and Normal Rate  Volume:  Normal  Mood:  Euthymic  Affect:  Congruent  Thought Process:  Goal Directed and Descriptions of Associations: Circumstantial  Orientation:  Full (Time, Place, and Person)  Thought Content: Logical   Suicidal Thoughts:  No  Homicidal Thoughts:  No  Memory:  Immediate;   Poor Recent;   Poor Remote;   Good  Judgement:  Good  Insight:  Good  Psychomotor Activity:  mandibular motion up and down is slightly improved  Concentration:  Concentration: Fair and Attention Span: Good  Recall:  Good  Fund of Knowledge: Good  Language: Good  Assets:  Desire for Improvement Financial Resources/Insurance Housing Social Support Transportation  ADL's:  Intact  Cognition: WNL  Prognosis:  Good   DIAGNOSES:    ICD-10-CM   1. Tardive dyskinesia  G24.01     2. Bipolar II disorder (HCC)  F31.81     3. Generalized anxiety disorder  F41.1     4. PTSD (post-traumatic  stress disorder)  F43.10     5. Drooling  K11.7      Receiving Psychotherapy: No   RECOMMENDATIONS:  PDMP reviewed.  Ativan filled 04/09/2023. I provided 32 minutes of face to face time during this encounter, including time spent before and after the visit in records review, medical decision making, counseling pertinent to today's visit, and charting.   She's doing remarkably well as far as her mental health goes. Getting out of bed, going downstairs to spend time with family is a great improvement.  [name] isn't as controlled as we'd like. I recommend increasing Austedo. No other med changes. As far as the drooling goes, will observe. It's possibly caused by  TD. I'll disc w/ Dr. Jennelle Human if it worsens or doesn't improve by the next OV.   Increase Austedo XR as follows:  Austedo XR take 6 mg+12 mg+ 24 mg=42 mg daily for 2 weeks,  then may increase to 48 mg.      Continue Cymbalta 80 mg daily. Continue Haldol 1 mg, 1 p.o. q am, and 2 po qhs. (ok to take an extra 1/2 pill in the middle of the night if she becomes really agitated.  Let me know the next day if they have to do that.) Continue Ativan 1 mg, 1/2 po during day, and 1 at hs. Continue mirtazapine 7.5 mg, 1 p.o. nightly. Continue prazosin 2 mg p.o. nightly at 8 PM. Continue Seroquel 200 mg, 1 p.o. nightly at 8 PM. Return in 3 weeks.  Melony Overly, PA-C

## 2023-04-30 DIAGNOSIS — M545 Low back pain, unspecified: Secondary | ICD-10-CM | POA: Diagnosis not present

## 2023-05-01 ENCOUNTER — Encounter: Payer: Self-pay | Admitting: Gastroenterology

## 2023-05-02 ENCOUNTER — Other Ambulatory Visit: Payer: Self-pay

## 2023-05-02 DIAGNOSIS — G2401 Drug induced subacute dyskinesia: Secondary | ICD-10-CM

## 2023-05-02 MED ORDER — AUSTEDO XR 24 MG PO TB24
1.0000 | ORAL_TABLET | Freq: Every day | ORAL | 5 refills | Status: DC
Start: 2023-05-02 — End: 2023-05-20

## 2023-05-10 ENCOUNTER — Telehealth: Payer: Self-pay | Admitting: Family Medicine

## 2023-05-10 NOTE — Telephone Encounter (Signed)
Hillary from the [facility], stated they received the medical clearance but the question about being authorized to sedate was left blank. Not sure if we still have this form but her number is left in the contact info. They do close at 1 today.

## 2023-05-14 NOTE — Telephone Encounter (Signed)
I put it on your desk

## 2023-05-14 NOTE — Telephone Encounter (Signed)
I don't see this form in media. Do you still have it?

## 2023-05-14 NOTE — Telephone Encounter (Signed)
Reviewed with Lowne. Form faxed back with answer

## 2023-05-20 ENCOUNTER — Encounter: Payer: Self-pay | Admitting: Physician Assistant

## 2023-05-20 ENCOUNTER — Telehealth (INDEPENDENT_AMBULATORY_CARE_PROVIDER_SITE_OTHER): Payer: Medicare Other | Admitting: Physician Assistant

## 2023-05-20 DIAGNOSIS — K117 Disturbances of salivary secretion: Secondary | ICD-10-CM | POA: Diagnosis not present

## 2023-05-20 DIAGNOSIS — G2571 Drug induced akathisia: Secondary | ICD-10-CM

## 2023-05-20 DIAGNOSIS — F99 Mental disorder, not otherwise specified: Secondary | ICD-10-CM | POA: Diagnosis not present

## 2023-05-20 DIAGNOSIS — F3181 Bipolar II disorder: Secondary | ICD-10-CM

## 2023-05-20 DIAGNOSIS — G2581 Restless legs syndrome: Secondary | ICD-10-CM | POA: Diagnosis not present

## 2023-05-20 DIAGNOSIS — F5105 Insomnia due to other mental disorder: Secondary | ICD-10-CM | POA: Diagnosis not present

## 2023-05-20 DIAGNOSIS — G2401 Drug induced subacute dyskinesia: Secondary | ICD-10-CM | POA: Diagnosis not present

## 2023-05-20 DIAGNOSIS — F411 Generalized anxiety disorder: Secondary | ICD-10-CM | POA: Diagnosis not present

## 2023-05-20 MED ORDER — LORAZEPAM 1 MG PO TABS: 1.0000 mg | ORAL_TABLET | Freq: Three times a day (TID) | ORAL | 1 refills | Status: DC | PRN

## 2023-05-20 MED ORDER — MIRTAZAPINE 15 MG PO TABS: 15.0000 mg | ORAL_TABLET | Freq: Every day | ORAL | 1 refills | Status: DC

## 2023-05-20 NOTE — Progress Notes (Signed)
[facility]  Patient ID: Megan Duncan,  MRN: [id number]  PCP: Donato Schultz, DO  Date of Evaluation: 05/20/2023 Time spent: 44 minutes  Chief Complaint:   Chief Complaint   Follow-up   Virtual Visit via Telehealth  I connected with patient by a video enabled telemedicine application with their informed consent, and verified patient privacy and that I am speaking with the correct person using two identifiers.  I am private, in my office and the patient is at home.  I discussed the limitations, risks, security and privacy concerns of performing an evaluation and management service by video and the availability of in person appointments. I also discussed with the patient that there may be a patient responsible charge related to this service. The patient expressed understanding and agreed to proceed.   I discussed the assessment and treatment plan with the patient. The patient was provided an opportunity to ask questions and all were answered. The patient agreed with the plan and demonstrated an understanding of the instructions.   The patient was advised to call back or seek an in-person evaluation if the symptoms worsen or if the condition fails to improve as anticipated.  I provided 44 minutes of non-face-to-face time during this encounter.  HISTORY/CURRENT STATUS: HPI For routine follow-up. Megan Duncan, husband, and Megan Duncan, dtr are on telehealth visit.   Pt states she's not doing well at all. The Austedo doesn't seem to be helping the chin movements much at all. Even with dose to max since LOV. Has had more trouble sleeping so Megan Duncan decreased dose back to 36 mg to see if that would help insomnia, but it didn't. And didn't make a difference in [name] either. Still drools a lot.  She also has the sensation of ants crawling inside her body.  See review of systems below.  Akathisia dx in October 2023, mirtazapine was started for that reason.  It did help for months, not  sure why it stopped.  She takes Ativan for anxiety and sleep, they have noticed it helping with this horrible sensation in her body, it is beneficial for maybe 4 hours or so.  I had only prescribed enough for 1.5 mg all day so she has not been taking  more than that, so unsure how much it would help.    She does not really enjoy much of anything.  Her physical symptoms prevent that.  She also has longstanding degenerative disc disease which inhibits mobility. Until the past few weeks, she'd been getting out of bed, going downstairs to spend time w/ fm, doesn't feel like doing that now. Energy is low. ADLs are limited. Personal hygiene is nl. Is sad b/c of all her physical problems. No change in memory.  Appetite has not changed.  Weight is stable.  Denies suicidal or homicidal thoughts.  Patient denies increased energy with decreased need for sleep, increased talkativeness, racing thoughts, impulsivity or risky behaviors, increased spending, increased libido, grandiosity, increased irritability or anger, paranoia, or hallucinations.  Review of Systems  Constitutional:  Positive for malaise/fatigue.  HENT: Negative.    Eyes: Negative.   Respiratory: Negative.    Cardiovascular: Negative.   Gastrointestinal: Negative.   Genitourinary: Negative.   Musculoskeletal:        See HPI and records on chart  Skin: Negative.   Neurological:        See HPI  Endo/Heme/Allergies: Negative.   Psychiatric/Behavioral:         See HPI  Individual Medical History/ Review of Systems: Changes? :Yes   jittery from the inside out, feels like ants are crawling in her body, Megan Duncan says she can see her skin crawl sometimes. Gets up and paces but it doesn't help. Ativan calms it down some.  Both legs will also jerk.  Took a drive last week one day when it was really bad and that helped a little, but sx recurred after she got back home.    Past medications for mental health diagnoses include: Trileptal, Lamictal,  Prozac Zoloft, Effexor XR, Cymbalta, Paxil, lithium, Depakote, Latuda, Risperdal, Tegretol, Wellbutrin, propranolol for tremor, Thorazine  Haldol and Zyprexa given for a few days inpatient January 2023, propranolol for lithium tremor, Aricept caused nausea , Ingrezza caused nausea, Austedo didn't help a lot  Hospitalized in January 2023 at [facility] for altered mental status with hallucinations, paranoia, and aggressive behavior  Allergies: Atorvastatin, Codeine, Diclofenac, Doxycycline, Fluconazole, and Sulfonamide derivatives  Current Medications:  Current Outpatient Medications:    acetaminophen (TYLENOL) 650 MG CR tablet, Take 650 mg by mouth every 8 (eight) hours as needed for pain., Disp: , Rfl:    Deutetrabenazine ER (AUSTEDO XR) 12 MG TB24, Take 1 tablet by mouth daily. Add to other doses=42 mg daily (Patient taking differently: Take 1 tablet by mouth daily.), Disp: 30 tablet, Rfl: 0   DULoxetine (CYMBALTA) 20 MG capsule, TAKE 1 CAPSULE BY MOUTH DAILY. TAKE WITH 60mg  FOR total OF 80 MG DAILY, Disp: 90 capsule, Rfl: 1   DULoxetine (CYMBALTA) 60 MG capsule, TAKE 1 CAPSULE BY MOUTH DAILY. TAKE WITH 20mg  FOR total OF 80mg  DAILY, Disp: 90 capsule, Rfl: 1   haloperidol (HALDOL) 1 MG tablet, 1 po q am, 2 po qhs., Disp: 90 tablet, Rfl: 5   hydrocortisone-pramoxine (ANALPRAM HC) 2.5-1 % rectal cream, Place 1 application rectally 3 (three) times daily., Disp: 30 g, Rfl: 3   Lancets MISC, Use as directed once a day.  Dx code: E11.9, Disp: 30 each, Rfl: 2   levothyroxine (SYNTHROID) 50 MCG tablet, TAKE 1 TABLET BY MOUTH EVERY DAY BEFORE breakfast, Disp: 90 tablet, Rfl: 1   mirtazapine (REMERON) 15 MG tablet, Take 1 tablet (15 mg total) by mouth at bedtime., Disp: 30 tablet, Rfl: 1   lidocaine (LIDODERM) 5 %, Place 1 patch onto the skin daily as needed. Remove & Discard patch within 12 hours or as directed by MD (Patient not taking: Reported on 03/14/2023), Disp: 30 patch, Rfl:  0   LORazepam (ATIVAN) 1 MG tablet, Take 1 tablet (1 mg total) by mouth 3 (three) times daily as needed for anxiety., Disp: 90 tablet, Rfl: 1   metFORMIN (GLUCOPHAGE) 1000 MG tablet, 1 po qd (Patient taking differently: Take 500 mg by mouth daily with breakfast. 1 po qd), Disp: 90 tablet, Rfl: 1   omeprazole (PRILOSEC) 20 MG capsule, Take 1 capsule (20 mg total) by mouth daily. (Patient not taking: Reported on 04/25/2023), Disp: 30 capsule, Rfl: 3   ondansetron (ZOFRAN) 4 MG tablet, Take 1 tablet (4 mg total) by mouth every 8 (eight) hours as needed for nausea or vomiting. (Patient not taking: Reported on 04/25/2023), Disp: 20 tablet, Rfl: 0   prazosin (MINIPRESS) 2 MG capsule, TAKE 1 CAPSULE BY MOUTH AT BEDTIME, Disp: 90 capsule, Rfl: 1   QUEtiapine (SEROQUEL) 200 MG tablet, TAKE 1 TABLET BY MOUTH EVERYDAY AT BEDTIME, Disp: 90 tablet, Rfl: 0 Medication Side Effects: nausea  Family Medical/ Social History: Changes? No  MENTAL HEALTH  EXAM:  There were no vitals taken for this visit.There is no height or weight on file to calculate BMI.  General Appearance: Casual and Well Groomed  Eye Contact:  Good  Speech:  Clear and Coherent and Normal Rate  Volume:  Normal  Mood:   sad  Affect:  Congruent  Thought Process:  Goal Directed and Descriptions of Associations: Circumstantial  Orientation:  Full (Time, Place, and Person)  Thought Content: Logical   Suicidal Thoughts:  No  Homicidal Thoughts:  No  Memory:   at her baseline  Judgement:  Good  Insight:  Good  Psychomotor Activity:  abnl mandibular motion, no obvious improvement  Concentration:  Concentration: Fair and Attention Span: Good  Recall:  Good  Fund of Knowledge: Good  Language: Good  Assets:  Desire for Improvement Financial Resources/Insurance Housing Social Support Transportation  ADL's:  Intact  Cognition: WNL  Prognosis:  Good   DIAGNOSES:    ICD-10-CM   1. Generalized anxiety disorder  F41.1     2. Tardive  dyskinesia  G24.01     3. Bipolar II disorder (HCC)  F31.81     4. 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 Drooling  K11.7     6. Restless leg  G25.81     7. Insomnia due to other mental disorder  F51.05    F99      Receiving Psychotherapy: No   RECOMMENDATIONS:  PDMP reviewed.  Ativan filled 05/13/2023. I provided 44 minutes of non-face to face time during this encounter, including time spent before and after the visit in records review, medical decision making, counseling pertinent to today's visit, and charting.   Karen Kitchens has several things going on:  Tardive dyskinesia has not responded to VMAT 2 inhibitors, and currently on Austedo which may be contributing to drooling.  Atropine drops sublingual may be a treatment for that side effect, however since it is not helping with [name] I recommend stopping the Austedo.  Patient, daughter, and husband agree.  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  She is on a low dose of mirtazapine so I recommend increasing that.  It should help with akathisia as well as sleep.  Patient, Megan Duncan, and Megan Duncan all agree.  She could also increase the Ativan from 1 mg routinely at at bedtime and 0.5 mg as needed during the day, up to 1 mg p.o. 3 times daily as needed.  They all agree with this plan.  We again discussed the fact that she is on 2 different antipsychotics, it may help decrease some of these side effects if we decrease the dose, or stop, either the Haldol or Seroquel.  But the 4 of [name] agree neither of those choices aren't an option.  She was hospitalized in January 2023 for a psychotic episode.  At some point, months later, we attempted a decrease in Haldol, I believe, which did not go well.  She became psychotic again, but we were able to keep her out of the hospital at that time.  We increased the Haldol and she's had no further psychotic episodes since then.  Again, we all agree that at this point it is better to have tardive dyskinesia than risk a psychotic event.  She is not  having trouble swallowing, is not chewing her tongue or buccal mucosa, but if that should change let me know immediately.  Decrease austedo XR 12 mg x 1 week then stop. Continue Cymbalta 80 mg daily. Continue Haldol 1 mg, 1 p.o. every  morning, 2 p.o. nightly. Increase Ativan 1 mg up to 1 p.o. 3 times daily as needed anxiety/akathisia.  She will continue 1 p.o. nightly routinely. Increase mirtazapine to 15 mg p.o. nightly. Continue prazosin 2 mg p.o. nightly at 8 PM. Continue Seroquel 200 mg, 1 p.o. nightly at 8 PM. Return in 2 weeks.  Megan Overly, PA-C

## 2023-05-20 NOTE — Telephone Encounter (Signed)
Megan Duncan followed up on call from last week. Form was faxed back with an answer of YES but it was unclear. Is the patient clear with no contraindications or yes the patient has contraindications. IF so, the contraindications need to be listed. Please call her back at [phone] to advise or refax form with clarification.

## 2023-05-21 ENCOUNTER — Other Ambulatory Visit: Payer: Self-pay

## 2023-05-21 ENCOUNTER — Emergency Department (HOSPITAL_COMMUNITY): Payer: Medicare Other

## 2023-05-21 ENCOUNTER — Emergency Department (HOSPITAL_COMMUNITY)
Admission: EM | Admit: 2023-05-21 | Discharge: 2023-05-21 | Disposition: A | Discharge status: Home / Self Care | Payer: Medicare Other | Attending: Emergency Medicine | Admitting: Emergency Medicine

## 2023-05-21 DIAGNOSIS — J449 Chronic obstructive pulmonary disease, unspecified: Secondary | ICD-10-CM | POA: Diagnosis not present

## 2023-05-21 DIAGNOSIS — S0990XA Unspecified injury of head, initial encounter: Secondary | ICD-10-CM | POA: Diagnosis not present

## 2023-05-21 DIAGNOSIS — I110 Hypertensive heart disease with heart failure: Secondary | ICD-10-CM | POA: Insufficient documentation

## 2023-05-21 DIAGNOSIS — M79651 Pain in right thigh: Secondary | ICD-10-CM | POA: Diagnosis not present

## 2023-05-21 DIAGNOSIS — W009XXA Unspecified fall due to ice and snow, initial encounter: Secondary | ICD-10-CM | POA: Insufficient documentation

## 2023-05-21 DIAGNOSIS — M25512 Pain in left shoulder: Secondary | ICD-10-CM | POA: Diagnosis not present

## 2023-05-21 DIAGNOSIS — M25552 Pain in left hip: Secondary | ICD-10-CM | POA: Diagnosis not present

## 2023-05-21 DIAGNOSIS — M47816 Spondylosis without myelopathy or radiculopathy, lumbar region: Secondary | ICD-10-CM | POA: Diagnosis not present

## 2023-05-21 DIAGNOSIS — W19XXXA Unspecified fall, initial encounter: Secondary | ICD-10-CM

## 2023-05-21 DIAGNOSIS — E119 Type 2 diabetes mellitus without complications: Secondary | ICD-10-CM | POA: Insufficient documentation

## 2023-05-21 DIAGNOSIS — Y92019 Unspecified place in single-family (private) house as the place of occurrence of the external cause: Secondary | ICD-10-CM | POA: Insufficient documentation

## 2023-05-21 DIAGNOSIS — Z79899 Other long term (current) drug therapy: Secondary | ICD-10-CM | POA: Diagnosis not present

## 2023-05-21 DIAGNOSIS — M25559 Pain in unspecified hip: Secondary | ICD-10-CM | POA: Diagnosis not present

## 2023-05-21 DIAGNOSIS — M7918 Myalgia, other site: Secondary | ICD-10-CM

## 2023-05-21 DIAGNOSIS — M79652 Pain in left thigh: Secondary | ICD-10-CM | POA: Insufficient documentation

## 2023-05-21 DIAGNOSIS — I10 Essential (primary) hypertension: Secondary | ICD-10-CM | POA: Diagnosis not present

## 2023-05-21 DIAGNOSIS — Z7984 Long term (current) use of oral hypoglycemic drugs: Secondary | ICD-10-CM | POA: Diagnosis not present

## 2023-05-21 DIAGNOSIS — M533 Sacrococcygeal disorders, not elsewhere classified: Secondary | ICD-10-CM | POA: Diagnosis not present

## 2023-05-21 DIAGNOSIS — I509 Heart failure, unspecified: Secondary | ICD-10-CM | POA: Diagnosis not present

## 2023-05-21 NOTE — Discharge Instructions (Addendum)
Tylenol as needed as directed. Follow up with your PCP as needed.

## 2023-05-21 NOTE — ED Triage Notes (Signed)
Pt arrives to ED c/o tailbone pain and HA post mechanical fall on ice cube. Pt did hit back of head on floor, no blood thinners reported, no LOC, no trauma or injury to head.

## 2023-05-21 NOTE — ED Provider Notes (Signed)
[place] EMERGENCY DEPARTMENT AT [facility] Provider Note   CSN: [id number] Arrival date & time: 05/21/23  2137     History {Add pertinent medical, surgical, social history, OB history to HPI:1} Chief Complaint  Patient presents with   Fall   Tailbone Pain    Megan Duncan is a 77 y.o. female.  77 year old female brought in by EMS after fall from home.  Patient states that she was getting a drink and dropped an ice cube, bent over to pick up the ice cube and fell onto her buttocks.  States that she did not hit her head, is not on blood thinners, no LOC.  Reports pain only to her tailbone area and is concerned because she has had a prior fractured tailbone.       Home Medications Prior to Admission medications   Medication Sig Start Date End Date Taking? Authorizing Provider  acetaminophen (TYLENOL) 650 MG CR tablet Take 650 mg by mouth every 8 (eight) hours as needed for pain.    [provider]  Deutetrabenazine ER (AUSTEDO XR) 12 MG TB24 Take 1 tablet by mouth daily. Add to other doses=42 mg daily Patient taking differently: Take 1 tablet by mouth daily. 04/29/23   Melony Overly T, PA-C  DULoxetine (CYMBALTA) 20 MG capsule TAKE 1 CAPSULE BY MOUTH DAILY. TAKE WITH 60mg  FOR total OF 80 MG DAILY 04/19/23   Hurst, Rosey Bath T, PA-C  DULoxetine (CYMBALTA) 60 MG capsule TAKE 1 CAPSULE BY MOUTH DAILY. TAKE WITH 20mg  FOR total OF 80mg  DAILY 04/19/23   Melony Overly T, PA-C  haloperidol (HALDOL) 1 MG tablet 1 po q am, 2 po qhs. 02/06/23   Hurst, Glade Nurse, PA-C  hydrocortisone-pramoxine (ANALPRAM HC) 2.5-1 % rectal cream Place 1 application rectally 3 (three) times daily. 08/01/15   Sheliah Hatch, MD  Lancets MISC Use as directed once a day.  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 Remove & Discard patch within 12 hours or as directed by MD Patient not taking: Reported on 03/14/2023 12/22/21   Sarina Ill, DO  LORazepam (ATIVAN) 1 MG tablet Take 1 tablet (1 mg total) by mouth 3 (three) times daily as needed for anxiety. 05/20/23   Cherie Ouch, PA-C  metFORMIN (GLUCOPHAGE) 1000 MG tablet 1 po qd Patient taking differently: Take 500 mg by mouth daily with breakfast. 1 po qd 12/27/22   Zola Button, Grayling Congress, DO  mirtazapine (REMERON) 15 MG tablet Take 1 tablet (15 mg total) by mouth at bedtime. 05/20/23   Cherie Ouch, PA-C  omeprazole (PRILOSEC) 20 MG capsule Take 1 capsule (20 mg total) by mouth daily. Patient not taking: Reported on 04/25/2023 12/27/22   Zola Button, Myrene Buddy R, DO  ondansetron (ZOFRAN) 4 MG tablet Take 1 tablet (4 mg total) by mouth every 8 (eight) hours as needed for nausea or vomiting. Patient not taking: Reported on 04/25/2023 12/27/22   Zola Button, Myrene Buddy R, DO  prazosin (MINIPRESS) 2 MG capsule TAKE 1 CAPSULE BY MOUTH AT BEDTIME 03/06/23   Melony Overly T, PA-C  QUEtiapine (SEROQUEL) 200 MG tablet TAKE 1 TABLET BY MOUTH EVERYDAY AT BEDTIME 03/26/23   Cherie Ouch, PA-C      Allergies  Atorvastatin, Codeine, Diclofenac, Doxycycline, Fluconazole, and Sulfonamide derivatives    Review of Systems   Review of Systems Negative except as per HPI Physical Exam Updated Vital Signs BP (!) 160/79   Pulse 77   Temp 98.6 F (37 C) (Oral)   Resp 16   Ht 5\' 2"  (1.575 m)   Wt 59 kg   SpO2 98%   BMI 23.78 kg/m  Physical Exam Vitals and nursing note reviewed.  Constitutional:      General: She is not in acute distress.    Appearance: She is well-developed. She is not diaphoretic.  HENT:     Head: Normocephalic and atraumatic.     Mouth/Throat:     Mouth: Mucous membranes are moist.  Eyes:     Pupils: Pupils are equal, round, and reactive to light.  Pulmonary:     Effort: Pulmonary effort is normal.  Abdominal:      Palpations: Abdomen is soft.     Tenderness: There is no abdominal tenderness.  Musculoskeletal:        General: Tenderness present. No swelling.     Cervical back: Normal range of motion and neck supple. No tenderness or bony tenderness. No pain with movement.     Thoracic back: No tenderness or bony tenderness.     Lumbar back: No tenderness or bony tenderness.       Back:  Skin:    General: Skin is warm and dry.     Findings: No erythema or rash.  Neurological:     Mental Status: She is alert and oriented to person, place, and time.     Cranial Nerves: No cranial nerve deficit.     Sensory: No sensory deficit.     Motor: No weakness.  Psychiatric:        Behavior: Behavior normal.     ED Results / Procedures / Treatments   Labs (all labs ordered are listed, but only abnormal results are displayed) Labs Reviewed - No data to display  EKG None  Radiology No results found.  Procedures Procedures  {Document cardiac monitor, telemetry assessment procedure when appropriate:1}  Medications Ordered in ED Medications - No data to display  ED Course/ Medical Decision Making/ A&P   {   Click here for ABCD2, HEART and other calculatorsREFRESH Note before signing :1}                          Medical Decision Making  This patient presents to the ED for concern of ***, this involves an extensive number of treatment options, and is a complaint that carries with it a high risk of complications and morbidity.  The differential diagnosis includes ***   Co morbidities that complicate the patient evaluation  ***   Additional history obtained:  Additional history obtained from *** External records from outside source obtained and reviewed including ***   Lab Tests:  I Ordered, and personally interpreted labs.  The pertinent results include:  ***   Imaging Studies ordered:  I ordered imaging studies including ***  I independently visualized and interpreted imaging  which showed *** I agree with the radiologist interpretation   Cardiac Monitoring: / EKG:  The patient was maintained on a cardiac monitor.  I personally viewed and interpreted the cardiac monitored which showed an underlying rhythm of: ***   Consultations Obtained:  I requested consultation with the ***,  and discussed lab and imaging findings as well as pertinent plan -  they recommend: ***   Problem List / ED Course / Critical interventions / Medication management  *** I ordered medication including ***  for ***  Reevaluation of the patient after these medicines showed that the patient {resolved/improved/worsened:23923::"improved"} I have reviewed the patients home medicines and have made adjustments as needed   Social Determinants of Health:  ***   Test / Admission - Considered:  ***   {Document critical care time when appropriate:1} {Document review of labs and clinical decision tools ie heart score, Chads2Vasc2 etc:1}  {Document your independent review of radiology images, and any outside records:1} {Document your discussion with family members, caretakers, and with consultants:1} {Document social determinants of health affecting pt's care:1} {Document your decision making why or why not admission, treatments were needed:1} Final Clinical Impression(s) / ED Diagnoses Final diagnoses:  None    Rx / DC Orders ED Discharge Orders     None

## 2023-05-21 NOTE — ED Notes (Signed)
Patient transported to X-ray 

## 2023-05-21 NOTE — ED Provider Notes (Incomplete)
[place] EMERGENCY DEPARTMENT AT [facility] Provider Note   CSN: [id number] Arrival date & time: 05/21/23  2137     History {Add pertinent medical, surgical, social history, OB history to HPI:1} Chief Complaint  Patient presents with  . Fall  . Tailbone Pain    Megan Duncan is a 77 y.o. female.  77 year old female brought in by EMS after fall from home.  Patient states that she was getting a drink and dropped an ice cube, bent over to pick up the ice cube and fell onto her buttocks.  States that she did not hit her head, is not on blood thinners, no LOC.  Reports pain only to her tailbone area and is concerned because she has had a prior fractured tailbone.       Home Medications Prior to Admission medications   Medication Sig Start Date End Date Taking? Authorizing Provider  acetaminophen (TYLENOL) 650 MG CR tablet Take 650 mg by mouth every 8 (eight) hours as needed for pain.    [provider]  Deutetrabenazine ER (AUSTEDO XR) 12 MG TB24 Take 1 tablet by mouth daily. Add to other doses=42 mg daily Patient taking differently: Take 1 tablet by mouth daily. 04/29/23   Melony Overly T, PA-C  DULoxetine (CYMBALTA) 20 MG capsule TAKE 1 CAPSULE BY MOUTH DAILY. TAKE WITH 60mg  FOR total OF 80 MG DAILY 04/19/23   Hurst, Rosey Bath T, PA-C  DULoxetine (CYMBALTA) 60 MG capsule TAKE 1 CAPSULE BY MOUTH DAILY. TAKE WITH 20mg  FOR total OF 80mg  DAILY 04/19/23   Melony Overly T, PA-C  haloperidol (HALDOL) 1 MG tablet 1 po q am, 2 po qhs. 02/06/23   Hurst, Glade Nurse, PA-C  hydrocortisone-pramoxine (ANALPRAM HC) 2.5-1 % rectal cream Place 1 application rectally 3 (three) times daily. 08/01/15   Sheliah Hatch, MD  Lancets MISC Use as directed once a day.  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 Remove & Discard patch within 12 hours or as directed by MD Patient not taking: Reported on 03/14/2023 12/22/21   Sarina Ill, DO  LORazepam (ATIVAN) 1 MG tablet Take 1 tablet (1 mg total) by mouth 3 (three) times daily as needed for anxiety. 05/20/23   Cherie Ouch, PA-C  metFORMIN (GLUCOPHAGE) 1000 MG tablet 1 po qd Patient taking differently: Take 500 mg by mouth daily with breakfast. 1 po qd 12/27/22   Zola Button, Grayling Congress, DO  mirtazapine (REMERON) 15 MG tablet Take 1 tablet (15 mg total) by mouth at bedtime. 05/20/23   Cherie Ouch, PA-C  omeprazole (PRILOSEC) 20 MG capsule Take 1 capsule (20 mg total) by mouth daily. Patient not taking: Reported on 04/25/2023 12/27/22   Zola Button, Myrene Buddy R, DO  ondansetron (ZOFRAN) 4 MG tablet Take 1 tablet (4 mg total) by mouth every 8 (eight) hours as needed for nausea or vomiting. Patient not taking: Reported on 04/25/2023 12/27/22   Zola Button, Myrene Buddy R, DO  prazosin (MINIPRESS) 2 MG capsule TAKE 1 CAPSULE BY MOUTH AT BEDTIME 03/06/23   Melony Overly T, PA-C  QUEtiapine (SEROQUEL) 200 MG tablet TAKE 1 TABLET BY MOUTH EVERYDAY AT BEDTIME 03/26/23   Cherie Ouch, PA-C      Allergies  Atorvastatin, Codeine, Diclofenac, Doxycycline, Fluconazole, and Sulfonamide derivatives    Review of Systems   Review of Systems Negative except as per HPI Physical Exam Updated Vital Signs BP (!) 160/79   Pulse 77   Temp 98.6 F (37 C) (Oral)   Resp 16   Ht 5\' 2"  (1.575 m)   Wt 59 kg   SpO2 98%   BMI 23.78 kg/m  Physical Exam Vitals and nursing note reviewed.  Constitutional:      General: She is not in acute distress.    Appearance: She is well-developed. She is not diaphoretic.  HENT:     Head: Normocephalic and atraumatic.     Mouth/Throat:     Mouth: Mucous membranes are moist.  Eyes:     Pupils: Pupils are equal, round, and reactive to light.  Pulmonary:     Effort: Pulmonary effort is normal.  Abdominal:      Palpations: Abdomen is soft.     Tenderness: There is no abdominal tenderness.  Musculoskeletal:        General: Tenderness present. No swelling.     Cervical back: Normal range of motion and neck supple. No tenderness or bony tenderness. No pain with movement.     Thoracic back: No tenderness or bony tenderness.     Lumbar back: No tenderness or bony tenderness.       Back:  Skin:    General: Skin is warm and dry.     Findings: No erythema or rash.  Neurological:     Mental Status: She is alert and oriented to person, place, and time.     Cranial Nerves: No cranial nerve deficit.     Sensory: No sensory deficit.     Motor: No weakness.  Psychiatric:        Behavior: Behavior normal.     ED Results / Procedures / Treatments   Labs (all labs ordered are listed, but only abnormal results are displayed) Labs Reviewed - No data to display  EKG None  Radiology No results found.  Procedures Procedures  {Document cardiac monitor, telemetry assessment procedure when appropriate:1}  Medications Ordered in ED Medications - No data to display  ED Course/ Medical Decision Making/ A&P   {   Click here for ABCD2, HEART and other calculatorsREFRESH Note before signing :1}                          Medical Decision Making Amount and/or Complexity of Data Reviewed Radiology: ordered.   This patient presents to the ED for concern of ***, this involves an extensive number of treatment options, and is a complaint that carries with it a high risk of complications and morbidity.  The differential diagnosis includes ***   Co morbidities that complicate the patient evaluation  ***   Additional history obtained:  Additional history obtained from *** External records from outside source obtained and reviewed including ***   Lab Tests:  I Ordered, and personally interpreted labs.  The pertinent results include:  ***   Imaging Studies ordered:  I ordered imaging studies  including ***  I independently visualized and interpreted imaging which showed *** I agree with the radiologist interpretation   Cardiac Monitoring: / EKG:  The patient was maintained on a cardiac monitor.  I personally viewed and interpreted the cardiac monitored which showed an underlying rhythm of: ***   Consultations Obtained:  I requested consultation with the ***,  and discussed  lab and imaging findings as well as pertinent plan - they recommend: ***   Problem List / ED Course / Critical interventions / Medication management  *** I ordered medication including ***  for ***  Reevaluation of the patient after these medicines showed that the patient {resolved/improved/worsened:23923::"improved"} I have reviewed the patients home medicines and have made adjustments as needed   Social Determinants of Health:  ***   Test / Admission - Considered:  ***   {Document critical care time when appropriate:1} {Document review of labs and clinical decision tools ie heart score, Chads2Vasc2 etc:1}  {Document your independent review of radiology images, and any outside records:1} {Document your discussion with family members, caretakers, and with consultants:1} {Document social determinants of health affecting pt's care:1} {Document your decision making why or why not admission, treatments were needed:1} Final Clinical Impression(s) / ED Diagnoses Final diagnoses:  None    Rx / DC Orders ED Discharge Orders     None

## 2023-05-27 ENCOUNTER — Telehealth: Payer: Self-pay

## 2023-05-27 NOTE — Telephone Encounter (Signed)
Transition Care Management Follow-up Telephone Call Date of discharge and from where: [facility] 7/9 How have you been since you were released from the hospital? Still sore  Any questions or concerns? No  Items Reviewed: Did the pt receive and understand the discharge instructions provided? Yes  Medications obtained and verified? Yes  Other? No  Any new allergies since your discharge? No  Dietary orders reviewed? No Do you have support at home? Yes     Follow up appointments reviewed:  PCP Hospital f/u appt confirmed? No  Scheduled to see  on  @ . Specialist Hospital f/u appt confirmed?  Scheduled to see  on  @ . Are transportation arrangements needed? No  If their condition worsens, is the pt aware to call PCP or go to the Emergency Dept.? Yes Was the patient provided with contact information for the PCP's office or ED? Yes Was to pt encouraged to call back with questions or concerns? Yes

## 2023-05-30 ENCOUNTER — Ambulatory Visit: Payer: Medicare Other | Admitting: Family Medicine

## 2023-05-30 ENCOUNTER — Encounter: Payer: Self-pay | Admitting: Family Medicine

## 2023-05-30 ENCOUNTER — Telehealth: Payer: Self-pay | Admitting: Physician Assistant

## 2023-05-30 ENCOUNTER — Telehealth: Payer: Self-pay | Admitting: Family Medicine

## 2023-05-30 VITALS — BP 140/88 | HR 96 | Temp 98.4°F | Resp 18 | Ht 62.0 in

## 2023-05-30 DIAGNOSIS — M545 Low back pain, unspecified: Secondary | ICD-10-CM

## 2023-05-30 MED ORDER — NAFTIFINE HCL 2 % EX CREA: TOPICAL_CREAM | CUTANEOUS | 3 refills | Status: DC

## 2023-05-30 MED ORDER — HYDROCODONE-ACETAMINOPHEN 5-325 MG PO TABS
1.0000 | ORAL_TABLET | Freq: Four times a day (QID) | ORAL | 0 refills | Status: DC | PRN
Start: 2023-05-30 — End: 2023-11-25

## 2023-05-30 NOTE — Assessment & Plan Note (Signed)
Xrays from er reviewed  Vicodin for pain

## 2023-05-30 NOTE — Telephone Encounter (Signed)
Pharmacy just wanted to make sure pcp was aware that pt is also on controlled substances,  LORazepam and haloperidol from other drs along with the hydrocodone prescribed today. Also needs a PA for NAFTIN

## 2023-05-30 NOTE — Progress Notes (Signed)
Established Patient Office Visit  Subjective   Patient ID: Megan Duncan, female    DOB: 1946/02/20  Age: 77 y.o. MRN: [id number]  Chief Complaint  Patient presents with   ED follow up    Fall and back pain    HPI Discussed the use of AI scribe software for clinical note transcription with the patient, who gave verbal consent to proceed.  History of Present Illness   The patient describes losing their balance while picking up ice and falling backwards, landing directly on their tailbone. They did not hit their head. She sought care at a hospital where two x-rays were taken and no fractures were identified. The pain is localized to the tailbone and pelvis. She has been managing the pain with Tylenol twice daily, but reports it is not providing sufficient relief. She has tried sitting on a donut cushion and applying Voltaren cream, but neither has helped. She denies any visible bruising in the area. She also reports a constant shaking, which she attributes to her medication.      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   Degenerative lumbar spinal stenosis    Anterolisthesis of lumbar spine    Lump in upper inner quadrant of left breast 07/13/2021   Left flank pain 07/13/2021   Lumbar radiculopathy 01/26/2021   Body mass index (BMI) 36.0-36.9, adult 01/11/2021   Degeneration of lumbar intervertebral disc 08/22/2020   Retrolisthesis of vertebrae 08/22/2020   Foraminal stenosis of lumbar region 08/22/2020   Generalized anxiety disorder 09/01/2018   Type II diabetes mellitus 06/05/2018   Tinea corporis 09/01/2017   Left upper quadrant pain 09/01/2017   Estrogen deficiency 09/01/2017    Thoracic aortic atherosclerosis 05/14/2017   COPD (chronic obstructive pulmonary disease) 10/30/2016   Allergic rhinitis 10/03/2016   Palpitations 07/20/2016   DOE (dyspnea on exertion) 07/20/2016   External hemorrhoid 08/01/2015   Stress incontinence, female 08/01/2015   Dermatitis 06/02/2015   Shingles outbreak 04/14/2015   Hyperlipidemia associated with type 2 diabetes mellitus (HCC)    Fibromyalgia 10/14/2014   Bilateral hip pain 09/02/2014   Deformity of finger 07/15/2014   Ceruminosis 03/04/2014   Tinnitus 03/04/2014   Torticollis 03/04/2014   Bipolar 2 disorder 12/03/2013   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-sided sciatica 08/06/2012   Lower abdominal pain 08/06/2012   Otitis media 09/05/2010   Peripheral neuropathy 02/22/2010   Lumbar back pain with radiculopathy affecting left lower extremity 01/24/2010   Cervicalgia 06/20/2007   Acute bilateral low back pain without sciatica 06/20/2007   Essential (primary) hypertension 04/23/2007   Inflammatory disease of uterus 04/23/2007   Past Medical History:  Diagnosis Date   Acute pain of right shoulder 09/24/2018   Acute upper respiratory infection 09/19/2010   Allergic rhinitis 10/03/2016   Anterolisthesis of lumbar spine    Arthritis    hands   Bilateral hip pain 09/02/2014   Bipolar II disorder    Cellulitis and abscess of trunk 09/15/2009   Ceruminosis 03/04/2014   Cervicalgia 06/20/2007   Chronic low back pain 06/20/2007   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-sided sciatica 08/06/2012   COPD (chronic obstructive pulmonary disease) (HCC)    Deformity  of finger 07/15/2014   Degeneration of lumbar intervertebral disc 08/22/2020   Degenerative lumbar spinal stenosis    Dermatitis 06/02/2015   Diastolic dysfunction    Per pt, diagnosed after [facility]   Diverticulosis    DOE (dyspnea on exertion) 07/20/2016   Essential (primary) hypertension 04/23/2007   Estrogen deficiency 09/01/2017   External hemorrhoid  08/01/2015   Fibromyalgia    Foraminal stenosis of lumbar region 08/22/2020   Generalized anxiety disorder 09/01/2018   Hyperlipidemia    Inflammatory disease of uterus 04/23/2007   Left flank pain 07/13/2021   Left upper quadrant pain 09/01/2017   Lower abdominal pain 08/06/2012   Lumbar back pain with radiculopathy affecting left lower extremity 01/24/2010   Lumbar radiculopathy 01/26/2021   Lump in upper inner quadrant of left breast 07/13/2021   Major depressive disorder    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   Otitis media 09/05/2010   Palpitations 07/20/2016   Peripheral neuropathy 02/22/2010   Retrolisthesis of vertebrae 08/22/2020   Shingles outbreak 04/14/2015   Sinusitis, acute maxillary 09/14/2013   Stress incontinence, female 08/01/2015   Thoracic aortic atherosclerosis 05/14/2017   Thyroid disease    Tinea corporis 09/01/2017   Tinnitus 03/04/2014   Torticollis 03/04/2014   Type II diabetes mellitus 06/05/2018   Past Surgical History:  Procedure Laterality Date   APPENDECTOMY     EYE SURGERY     KNEE ARTHROSCOPY Right 11/12/2006   lumbar spine proceedure     TONSILLECTOMY     TOTAL ABDOMINAL HYSTERECTOMY     Social History   Tobacco Use   Smoking status: Former    Current packs/day: 0.00    Average packs/day: 1.5 packs/day for 40.0 years (60.0 ttl pk-yrs)    Types: Cigarettes    Start date: 07/02/1964    Quit date: 07/02/2004    Years since quitting: 18.9    Passive exposure: Past   Smokeless tobacco: Never   Tobacco comments:    Verified by Adrian Saran (Husband), & Nelia Shi (Daughter)  Vaping Use   Vaping status: Never Used  Substance Use Topics   Alcohol use: Yes    Alcohol/week: 0.0 - 1.0 standard drinks of alcohol    Comment: occasional wine   Drug use: No   Social History   Socioeconomic History   Marital status: Married    Spouse name: Jossilyn Benda   Number of children: 3   Years of education: 14    Highest education level: Some college, no degree  Occupational History   Occupation: retired  Tobacco Use   Smoking status: Former    Current packs/day: 0.00    Average packs/day: 1.5 packs/day for 40.0 years (60.0 ttl pk-yrs)    Types: Cigarettes    Start date: 07/02/1964    Quit date: 07/02/2004    Years since quitting: 18.9    Passive exposure: Past   Smokeless tobacco: Never   Tobacco comments:    Verified by Adrian Saran (Husband), & Nelia Shi (Daughter)  Vaping Use   Vaping status: Never Used  Substance and Sexual Activity   Alcohol use: Yes    Alcohol/week: 0.0 - 1.0 standard drinks of alcohol    Comment: occasional wine   Drug use: No   Sexual activity: Never  Other Topics Concern   Not on file  Social History Narrative   Lives with husband and grandson she is raising.   11/07/21 Lives with husband   Social Determinants of Health  Financial Resource Strain: Medium Risk (11/29/2021)   Overall Financial Resource Strain (CARDIA)    Difficulty of Paying Living Expenses: Somewhat hard  Food Insecurity: No Food Insecurity (12/28/2022)   Hunger Vital Sign    Worried About Running Out of Food in the Last Year: Never true    Ran Out of Food in the Last Year: Never true  Transportation Needs: No Transportation Needs (12/28/2022)   PRAPARE - Administrator, Civil Service (Medical): No    Lack of Transportation (Non-Medical): No  Physical Activity: Inactive (11/29/2021)   Exercise Vital Sign    Days of Exercise per Week: 0 days    Minutes of Exercise per Session: 0 min  Stress: No Stress Concern Present (11/29/2021)   [employer] of Occupational Health - Occupational Stress Questionnaire    Feeling of Stress : Not at all  Social Connections: Socially Integrated (11/29/2021)   Social Connection and Isolation Panel [NHANES]    Frequency of Communication with Friends and Family: More than three times a week    Frequency of Social Gatherings with Friends  and Family: More than three times a week    Attends Religious Services: 1 to 4 times per year    Active Member of [employer] or Organizations: Yes    Attends Engineer, structural: More than 4 times per year    Marital Status: Married  Catering manager Violence: Not At Risk (11/29/2021)   Humiliation, Afraid, Rape, and Kick questionnaire    Fear of Current or Ex-Partner: No    Emotionally Abused: No    Physically Abused: No    Sexually Abused: No   Family Status  Relation Name Status   Mother  Deceased   Father  Deceased   Sister  Deceased   Mat Aunt  Deceased   Mat Aunt  (Not Specified)   Oceanographer  (Not Specified)   Neg Hx  (Not Specified)  No partnership data on file   Family History  Problem Relation Age of Onset   Hypertension Mother    Hiatal hernia Mother    Diabetes Father    Hypertension Father    Heart attack Father    Alcoholism Father    Bipolar disorder Father    Mental illness Sister    Suicidality Sister    Colon cancer Maternal Aunt 3   Breast cancer Maternal Aunt    Bipolar disorder Paternal Aunt    Esophageal cancer Neg Hx    Rectal cancer Neg Hx    Allergies  Allergen Reactions   Atorvastatin Other (See Comments)    Significant rise in liver tests   Codeine Nausea And Vomiting   Diclofenac Other (See Comments)    Elevated LFT's   Doxycycline     Extreme nausea   Fluconazole Other (See Comments)    Hallucinations    Sulfonamide Derivatives Swelling      Review of Systems  Constitutional:  Negative for fever and malaise/fatigue.  HENT:  Negative for congestion.   Eyes:  Negative for blurred vision.  Respiratory:  Negative for shortness of breath.   Cardiovascular:  Negative for chest pain, palpitations and leg swelling.  Gastrointestinal:  Negative for abdominal pain, blood in stool and nausea.  Genitourinary:  Negative for dysuria and frequency.  Musculoskeletal:  Positive for back pain. Negative for falls.  Skin:  Negative for rash.   Neurological:  Positive for weakness. Negative for dizziness, loss of consciousness and headaches.  Endo/Heme/Allergies:  Negative for  environmental allergies.  Psychiatric/Behavioral:  Negative for depression. The patient is not nervous/anxious.       Objective:     BP (!) 140/88 (BP Location: Right Arm, Patient Position: Sitting, Cuff Size: Large)   Pulse 96   Temp 98.4 F (36.9 C) (Oral)   Resp 18   Ht 5\' 2"  (1.575 m)   SpO2 95%   BMI 23.78 kg/m  BP Readings from Last 3 Encounters:  05/30/23 (!) 140/88  05/21/23 (!) 160/79  04/25/23 (!) 154/85   Wt Readings from Last 3 Encounters:  05/21/23 130 lb (59 kg)  04/25/23 162 lb (73.5 kg)  03/14/23 162 lb (73.5 kg)   SpO2 Readings from Last 3 Encounters:  05/30/23 95%  05/21/23 98%  04/25/23 96%      Physical Exam Vitals and nursing note reviewed.  Constitutional:      General: She is not in acute distress.    Appearance: Normal appearance. She is well-developed.  HENT:     Head: Normocephalic and atraumatic.  Eyes:     General: No scleral icterus.       Right eye: No discharge.        Left eye: No discharge.  Cardiovascular:     Rate and Rhythm: Normal rate and regular rhythm.     Heart sounds: No murmur heard. Pulmonary:     Effort: Pulmonary effort is normal. No respiratory distress.     Breath sounds: Normal breath sounds.  Musculoskeletal:        General: Tenderness present. Normal range of motion.     Cervical back: Normal range of motion and neck supple.     Right lower leg: No edema.     Left lower leg: No edema.  Skin:    General: Skin is warm and dry.  Neurological:     Mental Status: She is alert and oriented to person, place, and time.     Motor: Weakness present.     Gait: Gait abnormal.  Psychiatric:        Mood and Affect: Mood normal.        Behavior: Behavior normal.        Thought Content: Thought content normal.        Judgment: Judgment normal.      No results found for any  visits on 05/30/23.  Last CBC Lab Results  Component Value Date   WBC 8.5 12/27/2022   HGB 14.0 12/27/2022   HCT 41.8 12/27/2022   MCV 94.5 12/27/2022   MCH 31.0 08/01/2022   RDW 14.7 12/27/2022   PLT 376.0 12/27/2022   Last metabolic panel Lab Results  Component Value Date   GLUCOSE 99 12/27/2022   NA 143 12/27/2022   K 4.7 12/27/2022   CL 103 12/27/2022   CO2 29 12/27/2022   BUN 13 12/27/2022   CREATININE 1.10 12/27/2022   GFR 48.76 (L) 12/27/2022   CALCIUM 10.0 12/27/2022   PROT 6.9 12/27/2022   ALBUMIN 4.3 12/27/2022   BILITOT 0.4 12/27/2022   ALKPHOS 54 12/27/2022   AST 15 12/27/2022   ALT 10 12/27/2022   ANIONGAP 10 08/01/2022   Last lipids Lab Results  Component Value Date   CHOL 231 (H) 12/27/2022   HDL 95.60 12/27/2022   LDLCALC 115 (H) 12/27/2022   LDLDIRECT 120.1 12/03/2013   TRIG 101.0 12/27/2022   CHOLHDL 2 12/27/2022   Last hemoglobin A1c Lab Results  Component Value Date   HGBA1C 5.7 12/27/2022   Last thyroid  functions Lab Results  Component Value Date   TSH 2.27 12/27/2022   T4TOTAL 6.7 06/02/2019   Last vitamin D No results found for: "25OHVITD2", "25OHVITD3", "VD25OH" Last vitamin B12 and Folate Lab Results  Component Value Date   VITAMINB12 1,132 (H) 10/23/2021      The 10-year ASCVD risk score (Arnett DK, et al., 2019) is: 51.8%    Assessment & Plan:   Problem List Items Addressed This Visit       Unprioritized   Acute bilateral low back pain without sciatica - Primary    Xrays from er reviewed  Vicodin for pain        Relevant Medications   HYDROcodone-acetaminophen (NORCO/VICODIN) 5-325 MG tablet  Assessment and Plan    Coccyx Injury: Fall one week ago with persistent pain in the tailbone and bilateral hips. No improvement with Tylenol and Voltaren. No bruising or other signs of trauma noted on examination. -Prescribe Vicodin for pain control. -Advise alternating heat and ice for additional relief. -If no  improvement in one week, consider ordering an MRI.  Intertrigo: Recurrence of rash under the breasts, likely due to increased sweating in the current season. -Renew prescription for Naftan cream.  General Health Maintenance -Continue current medications and monitor for any adverse effects. -Encourage use of a walker at home for safety. -Follow-up in one week via MyChart to assess response to Vicodin and need for further imaging.        No follow-ups on file.    Donato Schultz, DO

## 2023-05-30 NOTE — Telephone Encounter (Signed)
I spoke w/ Dr. Jennelle Human about a second opinion, he's asked staff to call to schedule first available appt w/ him. I called Megan Duncan, pt's dtr who is very involved in her care, to let her know that a call would be coming. She will let her Mom and Megan Duncan, pt's husband, know. She'll cancel appt w/ me in a few weeks. I'm happy to continue care with Megan Duncan after she sees Dr. Jennelle Human if they all choose.

## 2023-05-31 ENCOUNTER — Telehealth: Payer: Self-pay

## 2023-05-31 NOTE — Telephone Encounter (Signed)
Pt husband notified of note below and PA team is working on [employer]

## 2023-05-31 NOTE — Telephone Encounter (Signed)
*  Primary  PA request received for Naftifine HCl 2% gel  PA submitted to [facility] Medicare via CMM and is pending additional questions/determination  Key: [id number]

## 2023-06-03 ENCOUNTER — Telehealth: Payer: Self-pay | Admitting: Family Medicine

## 2023-06-03 ENCOUNTER — Other Ambulatory Visit: Payer: Self-pay | Admitting: Family Medicine

## 2023-06-03 DIAGNOSIS — M545 Low back pain, unspecified: Secondary | ICD-10-CM

## 2023-06-03 DIAGNOSIS — B354 Tinea corporis: Secondary | ICD-10-CM

## 2023-06-03 MED ORDER — CLOTRIMAZOLE-BETAMETHASONE 1-0.05 % EX CREA: 1.0000 | TOPICAL_CREAM | Freq: Every day | CUTANEOUS | 0 refills | Status: DC

## 2023-06-03 NOTE — Telephone Encounter (Signed)
Megan Duncan (spouse DPR Ok) called stating pt is still in severe pain on her back and would like to look into getting the MRI that was mentioned at her last appt.

## 2023-06-03 NOTE — Telephone Encounter (Signed)
MRI has been scheduled.

## 2023-06-03 NOTE — Telephone Encounter (Signed)
PA denied.   Denied. This drug is not covered on the formulary. We are denying your request because we do not show that you have tried at least 2 covered drugs that can treat your condition. Other covered drug(s) is/are: Clotrimazole external 1% (cream, solution), Clotrimazole-betamethasone external cream 1-0.05%, Ciclopirox external gel 0.77%, Ciclopirox olamine external 0.77% (cream, suspension), Griseofulvin ultramicrosize oral tablet (125mg , 250mg ), Griseofulvin microsize oral tablet 500mg , Griseofulvin microsize oral suspension 125 mg/31ml. We may be able to make an exception to cover this drug. Your doctor will need to send [name] medical records showing that you tried this drug. If you cannot take the covered drug, your doctor will need to tell us why. Note: Some covered drug(s) may have quantity limits. Please refer to the formulary for details.

## 2023-06-05 ENCOUNTER — Telehealth: Payer: Self-pay | Admitting: Physician Assistant

## 2023-06-05 NOTE — Telephone Encounter (Signed)
[name] called and has questions for teresa about the recent med change. She would like teresa to call her after one thirty tomorrow. Her number is [phone]

## 2023-06-09 ENCOUNTER — Ambulatory Visit
Admission: RE | Admit: 2023-06-09 | Discharge: 2023-06-09 | Disposition: A | Discharge status: Home / Self Care | Payer: Medicare Other | Source: Ambulatory Visit | Attending: Family Medicine | Admitting: Family Medicine

## 2023-06-09 DIAGNOSIS — M48061 Spinal stenosis, lumbar region without neurogenic claudication: Secondary | ICD-10-CM | POA: Diagnosis not present

## 2023-06-09 DIAGNOSIS — M545 Low back pain, unspecified: Secondary | ICD-10-CM

## 2023-06-10 ENCOUNTER — Telehealth: Payer: Self-pay

## 2023-06-10 NOTE — Telephone Encounter (Signed)
Megan Duncan called to say that the Austedo had been stopped due to excessive drooling, but since stopping they said they were seeing benefit with [name]. The pharmacy has walked her thru titrating it back up, she just wanted to make you aware of this.  Patient fell on her bottom and was given Vicodin. Megan Duncan is asking if this is ok to take with Haldol. PA told her just don't take it near the Haldol.  She got some CBD oil to see if that would help with pain and it is not. [name] gave her 600 mg THC gummy and said that it helped with her pain. She wants to know if she can continue this.

## 2023-06-10 NOTE — Telephone Encounter (Signed)
I spoke w/ Megan Duncan, we had increased the Mirtazapine for akathisia, and also stopped Austedo b/c drooling. She's shaking a lot in her legs, more sporadic movements. Pt and husband feel like the Austedo was helping more than they felt like, so will restart Austedo. Megan Duncan has talked with Marcelino Duster at [facility], who told her the titration regimen to go back on it, I agree and if drooling occurs, we can use atropine drops SL or maybe scopolamine for that.  Has back pain after fall. Vicodin was given by PCP, said it did not help with the pain.  Tylenol has not helped the pain either.  Was given CBD that did not help, THC that was brought in [place] and gummy form was helpful for the pain.  I don't recommend the [facility] d/t her h/o psychosis. CBD either, b/c it's hard to know what's in them, or dose.  If she is in excruciating pain and is adamant about taking THC, she can take one half of a gummy in the middle of the day, nowhere near the time she takes Haldol, but at the first sign of hallucinations or delirium stopped the THC.  Megan Duncan understands and agrees.

## 2023-06-10 NOTE — Telephone Encounter (Signed)
See other phone note

## 2023-06-12 ENCOUNTER — Telehealth: Payer: Medicare Other | Admitting: Physician Assistant

## 2023-06-12 ENCOUNTER — Telehealth: Payer: Self-pay | Admitting: Physician Assistant

## 2023-06-12 NOTE — Telephone Encounter (Signed)
Please see message. I talked to Megan Duncan and she reports initially felt like the increased mirtazapine was helping but  sleep is "hit or miss." She wants to sleep because her akasthisia calms. Started back on Austedo yesterday. Reports Ativan helps some. Has not been giving TID, gives 1 at bedtime and 1/2 1-2 times during the day.  Megan Duncan feels like if you need to decrease the Haldol that patient will need to be inpatient, doesn't feel they can handle that at home. She is also trying to prepare herself in case hospitalization is needed and is asking for a local option. Patient had previously been in [facility] and it was not a pleasant experience. Megan Duncan was with mom all yesterday and is going to stay the night with her tonight to give stepfather a break.

## 2023-06-12 NOTE — Telephone Encounter (Signed)
Pt's daughter LVM @ 11:18a stating that pt is in distress and still having issues with akathisia.  She wants to know if she put her in the hospital.  Pls call daughter back at [phone]

## 2023-06-13 ENCOUNTER — Other Ambulatory Visit: Payer: Self-pay | Admitting: Physician Assistant

## 2023-06-13 MED ORDER — PROPRANOLOL HCL 10 MG PO TABS: 10.0000 mg | ORAL_TABLET | Freq: Two times a day (BID) | ORAL | 0 refills | Status: DC

## 2023-06-13 NOTE — Telephone Encounter (Signed)
I spoke with Dr. Jennelle Human concerning her symptoms.  At the end called [facility].  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  After she was taken off lithium in January 2023 while inpatient, I kept her on the propranolol for a few months just so not to make too many changes at one time, then stopped the propranolol May 2023.  First recommendation would be to restart the propranolol for akathisia.  [name] and I discussed at length and we agreed to make this change.  I will send in a prescription for 10 mg, she will take 1-2 twice daily as long as her BP is 110/70 or above and pulse is above 50.  Really 60 bpm would be even better.  I will call her tomorrow to check in. Other options would be to add be 6 500 to 1200 mg daily, possibly increase mirtazapine, or change Ativan to Klonopin.  If patient is not any better at all tomorrow after being on propranolol for 24 hours and then I will change Ativan to Klonopin and add B6. [name] wants her mom to be comfortable at this point.  She knows her condition is deteriorating but she hates to see her suffering with the akathisia.

## 2023-06-14 ENCOUNTER — Telehealth: Payer: Self-pay | Admitting: Family Medicine

## 2023-06-14 ENCOUNTER — Other Ambulatory Visit: Payer: Self-pay | Admitting: Physician Assistant

## 2023-06-14 DIAGNOSIS — M48061 Spinal stenosis, lumbar region without neurogenic claudication: Secondary | ICD-10-CM

## 2023-06-14 DIAGNOSIS — S32018G Other fracture of first lumbar vertebra, subsequent encounter for fracture with delayed healing: Secondary | ICD-10-CM

## 2023-06-14 DIAGNOSIS — S22089S Unspecified fracture of T11-T12 vertebra, sequela: Secondary | ICD-10-CM

## 2023-06-14 MED ORDER — CLONAZEPAM 0.5 MG PO TABS: 0.5000 mg | ORAL_TABLET | Freq: Three times a day (TID) | ORAL | 1 refills | Status: DC

## 2023-06-14 NOTE — Telephone Encounter (Signed)
Received after hours call 06/14/2023, for critical MRI results of Lumbar spine completed 06/09/2023, ordered 06/03/2023 secondary to reports of severe back pain after fall that occurred on 05/21/2023.  Injury occurred almost 1 month ago. Will message PCP critical results to follow up on Monday with patient.  I did go ahead and Initiate urgent neurosurgery  referral for patient, to [facility], since she has been referred to this location in the past.   IMPRESSION: 1. New linear edema involving the superior L1 and inferior T12 endplates with new 20% height loss at T12, compatible with acute/recent fractures given the history of recent fall with pain. Fractures extend to involve the posterior vertebral bodies at both levels and the pedicles bilaterally at L1. Approximately 3 mm retropulsion at L1 with mild canal stenosis. 2. Severe canal stenosis at L3-L4 and L4-L5. 3. Moderate right foraminal stenosis at L4-L5. Additional milder multilevel foraminal stenosis is detailed above.

## 2023-06-14 NOTE — Telephone Encounter (Signed)
I have discussed this case with Melony Overly PA-C.  I agree with the plan

## 2023-06-14 NOTE — Telephone Encounter (Signed)
12:50 PM I spoke with [name], patient's daughter.  Taking the Ativan around the clock and adding propranolol 10 mg twice daily dosing for 24 hours now has helped slightly.  Her blood pressure was in the 150/97 range, she does not have that written down in front of her.  She slept for about 4 hours straight last night which is good for her.  But the minute she woke up, she complained of shaking, has to get up and then sit down, back and forth.  Advised to discontinue Ativan.  Start Klonopin.  Prescription for Klonopin 0.5 mg, 1 to 1.5 pills 3 times daily routinely was sent to the pharmacy.  Also increase propranolol to 20 mg twice daily routinely.  No other change.  [name] knows that I will be out of the office next week but Dr. Jennelle Human is available if needed.  I will send this note to him for an update.

## 2023-06-17 ENCOUNTER — Telehealth: Payer: Self-pay | Admitting: Psychiatry

## 2023-06-17 NOTE — Telephone Encounter (Signed)
Next appt is 07/17/23. Megan Duncan's daughter [name] called and said that her medicines were changed. She was taken off of Ativan and replaced with Klonopin and added Propanolol to her lists.  Since this has happened, [name] says she is worse. She can barely talk, unable to settle down and has tremors. Also looks doped up.  Could someone call [name] at [phone].  Pharmacy is:  [facility] - [place], [place] - [address]   Phone: [phone]  Fax: [phone]

## 2023-06-17 NOTE — Telephone Encounter (Signed)
[name], daughter, reporting patient is worse since the medication changes. See note from [place]. She is agitated, can't settle down, not able to speak well, and she is a zombie. She is titrating up on Austedo now, is on week 2. [name] wasn't sure what the dose was. She reports patient fell, had an MRI of her spine, and is seeing a neurosurgeon emergently tomorrow morning. Husband is having difficulty taking care of her. Daughter is very involved in her care, but doesn't live with patient.

## 2023-06-17 NOTE — Telephone Encounter (Signed)
The changes made last week were to address the akathisia, restlessness.  Is she less restless?  The statements that she is both agitated and a "zombie"  seem contradictory.  Generally one is either "agitated or a zombie" and not both.  This makes it difficult to know what to do for her.

## 2023-06-18 DIAGNOSIS — S32019A Unspecified fracture of first lumbar vertebra, initial encounter for closed fracture: Secondary | ICD-10-CM | POA: Diagnosis not present

## 2023-06-18 DIAGNOSIS — M8008XA Age-related osteoporosis with current pathological fracture, vertebra(e), initial encounter for fracture: Secondary | ICD-10-CM | POA: Diagnosis not present

## 2023-06-18 DIAGNOSIS — S22089A Unspecified fracture of T11-T12 vertebra, initial encounter for closed fracture: Secondary | ICD-10-CM | POA: Diagnosis not present

## 2023-06-19 NOTE — Telephone Encounter (Addendum)
Megan Duncan reporting mom had an MRI that revealed 2 "broken discs" in her back. She will have an OP procedure tomorrow under local anesthesia. Discussion had with Megan Duncan that maybe some of the restlessness was due to back pain, but that patient was not able to communicate this. Patient was given Tramadol. Megan Duncan reporting that patient asked to stop propranolol and clonazepam and that was done yesterday and back on Ativan. No change in any complaints thus far. Megan Duncan is aware provider is on vacation. She does not want to make any medication changes until after the procedure and seeing how patient is afterwards. Megan Duncan feels that some sx are due to medications and do not want to keep throwing pills at them. Megan Duncan reporting doctor said they have had great success with the procedure.

## 2023-06-20 DIAGNOSIS — M8008XA Age-related osteoporosis with current pathological fracture, vertebra(e), initial encounter for fracture: Secondary | ICD-10-CM | POA: Diagnosis not present

## 2023-06-20 DIAGNOSIS — S22080A Wedge compression fracture of T11-T12 vertebra, initial encounter for closed fracture: Secondary | ICD-10-CM | POA: Diagnosis not present

## 2023-06-21 ENCOUNTER — Other Ambulatory Visit: Payer: Self-pay | Admitting: Family Medicine

## 2023-06-21 ENCOUNTER — Other Ambulatory Visit: Payer: Self-pay | Admitting: Physician Assistant

## 2023-06-21 DIAGNOSIS — E1165 Type 2 diabetes mellitus with hyperglycemia: Secondary | ICD-10-CM

## 2023-06-24 ENCOUNTER — Other Ambulatory Visit: Payer: Self-pay | Admitting: Physician Assistant

## 2023-07-03 DIAGNOSIS — Z79899 Other long term (current) drug therapy: Secondary | ICD-10-CM | POA: Diagnosis not present

## 2023-07-03 DIAGNOSIS — G894 Chronic pain syndrome: Secondary | ICD-10-CM | POA: Diagnosis not present

## 2023-07-03 DIAGNOSIS — S22089A Unspecified fracture of T11-T12 vertebra, initial encounter for closed fracture: Secondary | ICD-10-CM | POA: Diagnosis not present

## 2023-07-03 DIAGNOSIS — Z79891 Long term (current) use of opiate analgesic: Secondary | ICD-10-CM | POA: Diagnosis not present

## 2023-07-03 DIAGNOSIS — S32019A Unspecified fracture of first lumbar vertebra, initial encounter for closed fracture: Secondary | ICD-10-CM | POA: Diagnosis not present

## 2023-07-10 ENCOUNTER — Other Ambulatory Visit: Payer: Self-pay | Admitting: Physician Assistant

## 2023-07-10 MED ORDER — AUSTEDO XR 12 MG PO TB24: 1.0000 | ORAL_TABLET | Freq: Every day | ORAL | 0 refills | Status: DC

## 2023-07-10 MED ORDER — AUSTEDO XR 6 MG PO TB24: 6.0000 mg | ORAL_TABLET | Freq: Every day | ORAL | 0 refills | Status: DC

## 2023-07-17 ENCOUNTER — Other Ambulatory Visit: Payer: Self-pay | Admitting: Physician Assistant

## 2023-07-17 ENCOUNTER — Ambulatory Visit (INDEPENDENT_AMBULATORY_CARE_PROVIDER_SITE_OTHER): Payer: Medicare Other | Admitting: Psychiatry

## 2023-07-17 ENCOUNTER — Encounter: Payer: Self-pay | Admitting: Psychiatry

## 2023-07-17 DIAGNOSIS — F411 Generalized anxiety disorder: Secondary | ICD-10-CM

## 2023-07-17 DIAGNOSIS — G2571 Drug induced akathisia: Secondary | ICD-10-CM | POA: Diagnosis not present

## 2023-07-17 DIAGNOSIS — F5105 Insomnia due to other mental disorder: Secondary | ICD-10-CM | POA: Diagnosis not present

## 2023-07-17 DIAGNOSIS — G2401 Drug induced subacute dyskinesia: Secondary | ICD-10-CM

## 2023-07-17 DIAGNOSIS — K117 Disturbances of salivary secretion: Secondary | ICD-10-CM

## 2023-07-17 DIAGNOSIS — R4189 Other symptoms and signs involving cognitive functions and awareness: Secondary | ICD-10-CM

## 2023-07-17 DIAGNOSIS — G3184 Mild cognitive impairment, so stated: Secondary | ICD-10-CM

## 2023-07-17 DIAGNOSIS — F99 Mental disorder, not otherwise specified: Secondary | ICD-10-CM | POA: Diagnosis not present

## 2023-07-17 DIAGNOSIS — F3181 Bipolar II disorder: Secondary | ICD-10-CM

## 2023-07-17 MED ORDER — ATROPINE SULFATE 1 % OP SOLN
OPHTHALMIC | 0 refills | Status: DC
Start: 2023-07-17 — End: 2023-08-05

## 2023-07-17 NOTE — Progress Notes (Signed)
Megan Duncan [id number] [birth year] 77 y.o.  Subjective:   Patient ID:  Megan Duncan is a 77 y.o. (DOB 1946-02-08) female.  Chief Complaint:  Chief Complaint  Patient presents with   Follow-up   Depression   Anxiety   Altered Mental Status   Medication Reaction   Sleeping Problem    HPI Megan Duncan presents to the office today for follow-up of treatment resistant tardive dyskinesia, confusion, anxiety, sleep problems and mood problems. Seen with D and H.   Fell with 2 fx vertebra with surgery a couple of mos ago. Tremor is bad per H.  Shakes all over but worse at night.  Drooling bad for a long time and not better off it.   Wk 6 of Austedo 42mg  for day.  D says some benefit with it.   SE ingrezza N .  Tried twice.  Worsening hallucinations after trying to reduce haloperidol.   D's #1 concern is her restlessness.  Pt  notes it intermittently.    H says night is worse with awakening restless about 2 AM.  Worse in middle of night.    Restlessness for a long while.   Awake 30 min to an hour.  No NM now. Prazosin helped. Weakness makes it hard for her to move around at night.    I think I do re: dep and anxiety.  No voices. Denies fearfulness except when feels she can't move esp at night.   Notices confusion.  Family notices she loses thoughts and some difficulty putting words together but other times can put out a normal sentence.    Seeking home health care assistance DT can't leave her alone  Psych med: Austedo XR 42, clonazepam 0.5 stopped DT sedation , lorazepam 1 mg HS and 0.5 mg twice daily prn,  haloperidol 1 mg AM and 2 mg HS, mirtazapine 15 HS, prazosin 2 mg HS for NM and BP, stopped propranolol 10 at same time as clonazepam, duloxetine 80,  Quetiapine 200 mg HS.  Past medications for mental health diagnoses include: Trileptal, Lamictal, Prozac Zoloft, Effexor XR, Cymbalta, Paxil, lithium, Depakote, Latuda, Risperdal, Tegretol, Wellbutrin, propranolol  for tremor, Thorazine  Haldol and Zyprexa given for a few days inpatient January 2023, propranolol for lithium tremor, Aricept caused nausea , Ingrezza caused nausea, Austedo didn't help a lot   Hospitalized in January 2023 at [facility] for altered mental status with hallucinations, paranoia, and aggressive behavior   AUDIT    Flowsheet Row Admission (Discharged) from 12/03/2021 in [facility] BE[facility]IORAL [facility]ICINE  Alcohol Use Disorder Identification Test Final Score (AUDIT) 1      Mini-Mental    Flowsheet Row Office Visit from 08/17/2021 in [facility] Clinical Support from 01/10/2017 in [facility] at [facility]  Total Score (max 30 points ) 26 29      PHQ2-9    Flowsheet Row Clinical Support from 12/28/2022 in [facility] Primary Care at [facility] Office Visit from 08/07/2022 in [facility] at [facility] Chronic Care Management from 11/27/2021 in [facility] at [facility] Office Visit from 08/17/2021 in [facility] Clinical Support from 08/15/2021 in [facility] Primary Care at [facility]  PHQ-2 Total Score 6 0 0 0 0  PHQ-9 Total Score 16 -- -- -- --      Flowsheet Row ED from 05/21/2023 in   Emergency Department at [facility] ED from 08/02/2022 in [facility] Emergency Department at [facility] ED from 08/01/2022 in [facility] Emergency Department at [facility]  C-SSRS RISK CATEGORY No Risk No Risk No Risk        Review of Systems:  Review of Systems  Neurological:  Positive for tremors and weakness.  Psychiatric/Behavioral:  Positive for agitation, behavioral problems, confusion, decreased concentration and sleep disturbance. The patient is nervous/anxious.     Medications: I have reviewed the patient's current medications.  Current  Outpatient Medications  Medication Sig Dispense Refill   acetaminophen (TYLENOL) 650 MG CR tablet Take 650 mg by mouth every 8 (eight) hours as needed for pain.     atropine 1 % ophthalmic solution 1 drop on each side of tongue up to 3 times daily as needed for drooling 2 mL 0   clonazePAM (KLONOPIN) 0.5 MG tablet Take 1-1.5 tablets (0.5-0.75 mg total) by mouth 3 (three) times daily. (Patient not taking: Reported on 07/17/2023) 60 tablet 1   clotrimazole-betamethasone (LOTRISONE) cream Apply 1 Application topically daily. 30 g 0   Deutetrabenazine ER (AUSTEDO XR) 12 MG TB24 Take 1 tablet by mouth daily. Add to other doses=42 mg daily 30 tablet 0   Deutetrabenazine ER (AUSTEDO XR) 6 MG TB24 Take 6 mg by mouth daily. Add to 36 mg=42 mg 30 tablet 0   DULoxetine (CYMBALTA) 20 MG capsule TAKE 1 CAPSULE BY MOUTH DAILY. TAKE WITH 60mg  FOR total OF 80 MG DAILY 90 capsule 1   DULoxetine (CYMBALTA) 60 MG capsule TAKE 1 CAPSULE BY MOUTH DAILY. TAKE WITH 20mg  FOR total OF 80mg  DAILY 90 capsule 1   haloperidol (HALDOL) 1 MG tablet TAKE 1 TABLET BY MOUTH EVERY MORNING, and TAKE 2 TABLETS NIGHTLY AT BEDTIME 90 tablet 0   HYDROcodone-acetaminophen (NORCO/VICODIN) 5-325 MG tablet Take 1 tablet by mouth every 6 (six) hours as needed for moderate pain. 30 tablet 0   hydrocortisone-pramoxine (ANALPRAM HC) 2.5-1 % rectal cream Place 1 application rectally 3 (three) times daily. 30 g 3   Lancets MISC Use as directed once a day.  Dx code: E11.9 30 each 2   levothyroxine (SYNTHROID) 50 MCG tablet TAKE 1 TABLET BY MOUTH EVERY DAY BEFORE breakfast 90 tablet 1   lidocaine (LIDODERM) 5 % Place 1 patch onto the skin daily as needed. Remove & Discard patch within 12 hours or as directed by MD 30 patch 0   LORazepam (ATIVAN) 1 MG tablet Take 1 tablet (1 mg total) by mouth 3 (three) times daily as needed for anxiety. 90 tablet 1   metFORMIN (GLUCOPHAGE) 1000 MG tablet TAKE 1 TABLET BY MOUTH EVERY DAY 90 tablet 1   mirtazapine  (REMERON) 15 MG tablet TAKE 1 TABLET BY MOUTH AT BEDTIME 30 tablet 1   Naftifine HCl (NAFTIN) 2 % CREA Apply every day 60 g 3   omeprazole (PRILOSEC) 20 MG capsule Take 1 capsule (20 mg total) by mouth daily. 30 capsule 3   ondansetron (ZOFRAN) 4 MG tablet Take 1 tablet (4 mg total) by mouth every 8 (eight) hours as needed for nausea or vomiting. 20 tablet 0   prazosin (MINIPRESS) 2 MG capsule TAKE 1 CAPSULE BY MOUTH AT BEDTIME 90 capsule 1   QUEtiapine (SEROQUEL) 200 MG tablet Take 0.5 tablets (100 mg total) by mouth at bedtime.     No current facility-administered medications for this visit.    Medication Side Effects: Other: As as  noted above  Allergies:  Allergies  Allergen Reactions   Atorvastatin Other (See Comments)    Significant rise in liver tests   Codeine Nausea And Vomiting   Diclofenac Other (See Comments)    Elevated LFT's   Doxycycline     Extreme nausea   Fluconazole Other (See Comments)    Hallucinations    Sulfonamide Derivatives Swelling    Past Medical History:  Diagnosis Date   Acute pain of right shoulder 09/24/2018   Acute upper respiratory infection 09/19/2010   Allergic rhinitis 10/03/2016   Anterolisthesis of lumbar spine    Arthritis    hands   Bilateral hip pain 09/02/2014   Bipolar II disorder    Cellulitis and abscess of trunk 09/15/2009   Ceruminosis 03/04/2014   Cervicalgia 06/20/2007   Chronic low back pain 06/20/2007   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-sided sciatica 08/06/2012   COPD (chronic obstructive pulmonary disease) (HCC)    Deformity of finger 07/15/2014   Degeneration of lumbar intervertebral disc 08/22/2020   Degenerative lumbar spinal stenosis    Dermatitis 06/02/2015   Diastolic dysfunction    Per pt, diagnosed after [facility]   Diverticulosis    DOE (dyspnea on exertion) 07/20/2016   Essential (primary) hypertension 04/23/2007   Estrogen deficiency 09/01/2017   External hemorrhoid 08/01/2015   Fibromyalgia     Foraminal stenosis of lumbar region 08/22/2020   Generalized anxiety disorder 09/01/2018   Hyperlipidemia    Inflammatory disease of uterus 04/23/2007   Left flank pain 07/13/2021   Left upper quadrant pain 09/01/2017   Lower abdominal pain 08/06/2012   Lumbar back pain with radiculopathy affecting left lower extremity 01/24/2010   Lumbar radiculopathy 01/26/2021   Lump in upper inner quadrant of left breast 07/13/2021   Major depressive disorder    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   Otitis media 09/05/2010   Palpitations 07/20/2016   Peripheral neuropathy 02/22/2010   Retrolisthesis of vertebrae 08/22/2020   Shingles outbreak 04/14/2015   Sinusitis, acute maxillary 09/14/2013   Stress incontinence, female 08/01/2015   Thoracic aortic atherosclerosis 05/14/2017   Thyroid disease    Tinea corporis 09/01/2017   Tinnitus 03/04/2014   Torticollis 03/04/2014   Type II diabetes mellitus 06/05/2018    Past Medical History, Surgical history, Social history, and Family history were reviewed and updated as appropriate.   Please see review of systems for further details on the patient's review from today.   Objective:   Physical Exam:  There were no vitals taken for this visit.  Physical Exam Constitutional:      General: She is not in acute distress.    Appearance: She is well-developed. She is ill-appearing.  Musculoskeletal:        General: No deformity.  Neurological:     Mental Status: She is alert and oriented to person, place, and time.     Motor: Weakness and tremor present.     Coordination: Coordination abnormal.  Psychiatric:        Attention and Perception: She is inattentive. She does not perceive auditory hallucinations.        Mood and Affect: Mood is anxious. Mood is not depressed. Affect is not labile, blunt, angry or inappropriate.        Speech: Speech normal. Speech is not rapid and pressured or slurred.        Behavior:  Behavior normal.        Thought Content: Thought content normal. Thought content  is not delusional. Thought content does not include homicidal or suicidal ideation. Thought content does not include suicidal plan.        Cognition and Memory: Cognition is impaired. She exhibits impaired recent memory.        Judgment: Judgment normal.     Comments: Fair historian    Lab Review:     Component Value Date/Time   NA 143 12/27/2022 1538   K 4.7 12/27/2022 1538   CL 103 12/27/2022 1538   CO2 29 12/27/2022 1538   GLUCOSE 99 12/27/2022 1538   BUN 13 12/27/2022 1538   CREATININE 1.10 12/27/2022 1538   CREATININE 0.76 08/30/2017 1541   CALCIUM 10.0 12/27/2022 1538   PROT 6.9 12/27/2022 1538   ALBUMIN 4.3 12/27/2022 1538   AST 15 12/27/2022 1538   ALT 10 12/27/2022 1538   ALKPHOS 54 12/27/2022 1538   BILITOT 0.4 12/27/2022 1538   GFRNONAA 54 (L) 08/01/2022 1448   GFRAA 93 11/24/2008 1041       Component Value Date/Time   WBC 8.5 12/27/2022 1538   RBC 4.42 12/27/2022 1538   HGB 14.0 12/27/2022 1538   HCT 41.8 12/27/2022 1538   PLT 376.0 12/27/2022 1538   MCV 94.5 12/27/2022 1538   MCH 31.0 08/01/2022 1448   MCHC 33.5 12/27/2022 1538   RDW 14.7 12/27/2022 1538   LYMPHSABS 2.1 12/27/2022 1538   MONOABS 0.7 12/27/2022 1538   EOSABS 0.0 12/27/2022 1538   BASOSABS 0.1 12/27/2022 1538    Lithium Lvl  Date Value Ref Range Status  12/02/2021 1.39 (H) 0.60 - 1.20 mmol/L Final    Comment:    Performed at [facility], [address] [place], [place] [address]     No results found for: "PHENYTOIN", "PHENOBARB", "VALPROATE", "CBMZ"   .res Assessment: Plan:    Megan "Karen Kitchens" was seen today for follow-up, depression, anxiety, altered mental status, medication reaction and sleeping problem.  Diagnoses and all orders for this visit:  Bipolar II disorder (HCC)  Drooling -     atropine 1 % ophthalmic solution; 1 drop on each side of tongue up to 3 times daily as needed  for drooling  Tardive dyskinesia  Akathisia  Generalized anxiety disorder  Insomnia due to other mental disorder  Disturbed cognition  Mild cognitive impairment  Other orders -     QUEtiapine (SEROQUEL) 200 MG tablet; Take 0.5 tablets (100 mg total) by mouth at bedtime.    50 min urgent appt:  seen referred by Melony Overly PA DT TR of various problems including SE with meds, restlessness, intermittent confusion, depression and anxiety. Seen with H and D who care for her.  The family's most immediate concern is the akathisia.  It is quite distressing in the middle of the night but occurred during the day as well.  He did not respond to propranolol although Covid's Klonopin was also being used at the same time is not clear that she was just having side effects of Klonopin rather than side effects of propranolol.  This could be retried carefully.  History of relapse of psychosis when haloperidol was reduced.  So though this may be contributing to akathisia will not attempt to lower this dosage right now. She has been on the same dose of quetiapine for quite sometime with no recent attempt to reduce the dose.  For the drooling atropine drops should be helpful.  Tardive dyskinesia we will proceed with a trial of Austedo XR 42 mg daily.  She just got to this dosage but has seen no improvement so far.  No change in the duloxetine dose right now.  Since we are pursuing other med changes.  Lorazepam is necessary for now but we discussed his cognitive and other side effect risk. We discussed the short-term risks associated with benzodiazepines including sedation and increased fall risk among others.  Discussed long-term side effect risk including dependence, potential withdrawal symptoms, and the potential eventual dose-related risk of dementia.  But recent studies from 2020 dispute this association between benzodiazepines and dementia risk. Newer studies in 2020 do not support an association  with dementia.  Mirtazapine is being used for akathisia without benefit at 15 mg.  If the following med changes do not help we may consider increasing this dosage  Atropine eye drops 1 drop each side of tongue 2-3 times daily as needed. Reduce quetiapine to 100 mg nightly . If restlessness is not better in about 4 days , then increase mirtazapine to 30 mg nightly.  Follow-up as soon as possible.  Meredith Staggers MD, DFAPA Please see After Visit Summary for patient specific instructions.  Future Appointments  Date Time Provider Department Center  08/28/2023  3:00 PM Cottle, Steva Ready., MD [facility] None    No orders of the defined types were placed in this encounter.   -------------------------------

## 2023-07-17 NOTE — Patient Instructions (Addendum)
Atropine eye drops 1 drop each side of tongue 2-3 times daily as needed. Reduce quetiapine to 100 mg nightly . If restlessness is not better in about 4 days , then increase mirtazapine to 30 mg nightly.

## 2023-07-19 ENCOUNTER — Encounter: Payer: Self-pay | Admitting: Psychiatry

## 2023-07-19 MED ORDER — QUETIAPINE FUMARATE 200 MG PO TABS: 100.0000 mg | ORAL_TABLET | Freq: Every day | ORAL | Status: DC

## 2023-07-29 ENCOUNTER — Other Ambulatory Visit: Payer: Self-pay | Admitting: Physician Assistant

## 2023-07-30 ENCOUNTER — Encounter: Payer: Self-pay | Admitting: Physician Assistant

## 2023-08-05 ENCOUNTER — Other Ambulatory Visit: Payer: Self-pay

## 2023-08-05 ENCOUNTER — Other Ambulatory Visit: Payer: Self-pay | Admitting: Psychiatry

## 2023-08-05 ENCOUNTER — Telehealth: Payer: Self-pay | Admitting: Psychiatry

## 2023-08-05 DIAGNOSIS — K117 Disturbances of salivary secretion: Secondary | ICD-10-CM

## 2023-08-05 MED ORDER — ATROPINE SULFATE 1 % OP SOLN
OPHTHALMIC | 1 refills | Status: DC
Start: 2023-08-05 — End: 2023-08-05

## 2023-08-05 NOTE — Telephone Encounter (Signed)
Refill sent to Pharmacy below.

## 2023-08-05 NOTE — Telephone Encounter (Signed)
Pt will need RF on her atropine eye drops. Please send in to : [facility] [place], [place] - [address]  [address], [place] [address]

## 2023-08-13 DIAGNOSIS — M47816 Spondylosis without myelopathy or radiculopathy, lumbar region: Secondary | ICD-10-CM | POA: Diagnosis not present

## 2023-08-13 DIAGNOSIS — M48061 Spinal stenosis, lumbar region without neurogenic claudication: Secondary | ICD-10-CM | POA: Diagnosis not present

## 2023-08-13 DIAGNOSIS — M7918 Myalgia, other site: Secondary | ICD-10-CM | POA: Diagnosis not present

## 2023-08-20 ENCOUNTER — Encounter: Payer: Self-pay | Admitting: Family Medicine

## 2023-08-20 ENCOUNTER — Ambulatory Visit (INDEPENDENT_AMBULATORY_CARE_PROVIDER_SITE_OTHER): Payer: Medicare Other | Admitting: Family Medicine

## 2023-08-20 VITALS — BP 128/88 | HR 82 | Temp 98.7°F | Resp 18 | Ht 62.0 in

## 2023-08-20 DIAGNOSIS — R419 Unspecified symptoms and signs involving cognitive functions and awareness: Secondary | ICD-10-CM

## 2023-08-20 DIAGNOSIS — E785 Hyperlipidemia, unspecified: Secondary | ICD-10-CM | POA: Diagnosis not present

## 2023-08-20 DIAGNOSIS — E1165 Type 2 diabetes mellitus with hyperglycemia: Secondary | ICD-10-CM | POA: Diagnosis not present

## 2023-08-20 DIAGNOSIS — I10 Essential (primary) hypertension: Secondary | ICD-10-CM

## 2023-08-20 DIAGNOSIS — F3181 Bipolar II disorder: Secondary | ICD-10-CM | POA: Diagnosis not present

## 2023-08-20 DIAGNOSIS — Z23 Encounter for immunization: Secondary | ICD-10-CM | POA: Diagnosis not present

## 2023-08-20 DIAGNOSIS — E039 Hypothyroidism, unspecified: Secondary | ICD-10-CM

## 2023-08-20 DIAGNOSIS — E1169 Type 2 diabetes mellitus with other specified complication: Secondary | ICD-10-CM

## 2023-08-20 DIAGNOSIS — M48061 Spinal stenosis, lumbar region without neurogenic claudication: Secondary | ICD-10-CM

## 2023-08-20 DIAGNOSIS — R413 Other amnesia: Secondary | ICD-10-CM

## 2023-08-20 LAB — LIPID PANEL
Cholesterol: 223 mg/dL — ABNORMAL HIGH (ref 0–200)
HDL: 91 mg/dL (ref >39.00)
LDL Cholesterol: 115 mg/dL — ABNORMAL HIGH (ref 0–99)
NonHDL: 131.57
Total CHOL/HDL Ratio: 2
Triglycerides: 84 mg/dL (ref 0.0–149.0)
VLDL: 16.8 mg/dL (ref 0.0–40.0)

## 2023-08-20 LAB — CBC WITH DIFFERENTIAL/PLATELET
Basophils Absolute: 0.1 10*3/uL (ref 0.0–0.1)
Basophils Relative: 1 % (ref 0.0–3.0)
Eosinophils Absolute: 0 10*3/uL (ref 0.0–0.7)
Eosinophils Relative: 0.7 % (ref 0.0–5.0)
HCT: 39.9 % (ref 36.0–46.0)
Hemoglobin: 13 g/dL (ref 12.0–15.0)
Lymphocytes Relative: 23.9 % (ref 12.0–46.0)
Lymphs Abs: 1.4 10*3/uL (ref 0.7–4.0)
MCHC: 32.5 g/dL (ref 30.0–36.0)
MCV: 96.9 fL (ref 78.0–100.0)
Monocytes Absolute: 0.5 10*3/uL (ref 0.1–1.0)
Monocytes Relative: 9.1 % (ref 3.0–12.0)
Neutro Abs: 3.7 10*3/uL (ref 1.4–7.7)
Neutrophils Relative %: 65.3 % (ref 43.0–77.0)
Platelets: 277 10*3/uL (ref 150.0–400.0)
RBC: 4.11 Mil/uL (ref 3.87–5.11)
RDW: 14.7 % (ref 11.5–15.5)
WBC: 5.7 10*3/uL (ref 4.0–10.5)

## 2023-08-20 LAB — COMPREHENSIVE METABOLIC PANEL
ALT: 13 U/L (ref 0–35)
AST: 17 U/L (ref 0–37)
Albumin: 4 g/dL (ref 3.5–5.2)
Alkaline Phosphatase: 57 U/L (ref 39–117)
BUN: 14 mg/dL (ref 6–23)
CO2: 28 meq/L (ref 19–32)
Calcium: 9.6 mg/dL (ref 8.4–10.5)
Chloride: 105 meq/L (ref 96–112)
Creatinine, Ser: 1.05 mg/dL (ref 0.40–1.20)
GFR: 51.33 mL/min — ABNORMAL LOW (ref >60.00)
Glucose, Bld: 91 mg/dL (ref 70–99)
Potassium: 3.9 meq/L (ref 3.5–5.1)
Sodium: 143 meq/L (ref 135–145)
Total Bilirubin: 0.6 mg/dL (ref 0.2–1.2)
Total Protein: 6.2 g/dL (ref 6.0–8.3)

## 2023-08-20 LAB — HEMOGLOBIN A1C: Hgb A1c MFr Bld: 5.2 % (ref 4.6–6.5)

## 2023-08-20 LAB — TSH: TSH: 1.88 u[IU]/mL (ref 0.35–5.50)

## 2023-08-20 MED ORDER — DONEPEZIL HCL 5 MG PO TABS
5.0000 mg | ORAL_TABLET | Freq: Every day | ORAL | 3 refills | Status: DC
Start: 2023-08-20 — End: 2023-11-25

## 2023-08-20 NOTE — Assessment & Plan Note (Signed)
Per psych 

## 2023-08-20 NOTE — Assessment & Plan Note (Signed)
hgba1c to be checked, minimize simple carbs. Increase exercise as tolerated. Continue current meds  

## 2023-08-20 NOTE — Assessment & Plan Note (Signed)
Worsening  Pt did not want do do a mri brain at this time  They are requesting to see a neurologist

## 2023-08-20 NOTE — Assessment & Plan Note (Signed)
Tolerating statin, encouraged heart healthy diet, avoid trans fats, minimize simple carbs and saturated fats. Increase exercise as tolerated 

## 2023-08-20 NOTE — Assessment & Plan Note (Signed)
Worsening over the last few weeks Start aricept Referral to neurology

## 2023-08-20 NOTE — Progress Notes (Signed)
Established Patient Office Visit  Subjective   Patient ID: Megan Duncan, female    DOB: 1946/02/22  Age: 77 y.o. MRN: [id number]  Chief Complaint  Patient presents with   Altered Mental Status    Pt's husband states pt has declined over the last 2 weeks. Husband states patient had an episode but it wasn't witnessed.     HPI Discussed the use of AI scribe software for clinical note transcription with the patient, who gave verbal consent to proceed.  History of Present Illness   The patient, with a history of back issues, tardive dyskinesia, and memory problems, has reportedly regressed over the past couple of weeks. The caregiver, her husband, reports that the patient's back issues have been addressed with injections for spinal scoliosis and  for two cracked vertebrae. The tardive dyskinesia is causing significant drooling, which is being managed with prescription eye drops. The patient's memory problems are a significant concern, and the caregiver reports that the patient becomes particularly restless at night, a symptom that is being managed with Ativan and Klonopin. The patient also takes Tramadol for back pain. The caregiver reports that the patient requires assistance with bathing, dressing, and feeding, and cannot be left alone. The patient's mobility is limited, and she requires assistance with transfers. The caregiver also mentions that the patient sometimes has trouble swallowing.    Pt husband needs paperwork filled out for long term care at home.   Patient Active Problem List   Diagnosis Date Noted   Nausea 12/27/2022   Hypothyroidism 12/27/2022   Acute vaginitis 06/23/2022   Rash 04/29/2022   Altered mental status 12/03/2021   Anxiety 11/28/2021   Memory loss 10/26/2021   Bilateral hearing loss 10/26/2021   Neurocognitive disorder 09/07/2021   Degenerative lumbar spinal stenosis    Anterolisthesis of lumbar spine    Lump in upper inner quadrant of left breast  07/13/2021   Left flank pain 07/13/2021   Lumbar radiculopathy 01/26/2021   Body mass index (BMI) 36.0-36.9, adult 01/11/2021   Degeneration of lumbar intervertebral disc 08/22/2020   Retrolisthesis of vertebrae 08/22/2020   Foraminal stenosis of lumbar region 08/22/2020   Generalized anxiety disorder 09/01/2018   Type II diabetes mellitus 06/05/2018   Tinea corporis 09/01/2017   Left upper quadrant pain 09/01/2017   Estrogen deficiency 09/01/2017   Thoracic aortic atherosclerosis 05/14/2017   COPD (chronic obstructive pulmonary disease) 10/30/2016   Allergic rhinitis 10/03/2016   Palpitations 07/20/2016   DOE (dyspnea on exertion) 07/20/2016   External hemorrhoid 08/01/2015   Stress incontinence, female 08/01/2015   Dermatitis 06/02/2015   Shingles outbreak 04/14/2015   Hyperlipidemia associated with type 2 diabetes mellitus (HCC)    Fibromyalgia 10/14/2014   Bilateral hip pain 09/02/2014   Deformity of finger 07/15/2014   Ceruminosis 03/04/2014   Tinnitus 03/04/2014   Torticollis 03/04/2014   Bipolar 2 disorder 12/03/2013   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-sided sciatica 08/06/2012   Lower abdominal pain 08/06/2012   Otitis media 09/05/2010   Peripheral neuropathy 02/22/2010   Lumbar back pain with radiculopathy affecting left lower extremity 01/24/2010   Cervicalgia 06/20/2007   Acute bilateral low back pain without sciatica 06/20/2007   Essential (primary) hypertension 04/23/2007   Inflammatory disease of uterus 04/23/2007   Past Medical History:  Diagnosis Date   Acute pain of right shoulder 09/24/2018   Acute upper respiratory infection 09/19/2010   Allergic rhinitis 10/03/2016   Anterolisthesis of lumbar spine    Arthritis  hands   Bilateral hip pain 09/02/2014   Bipolar II disorder    Cellulitis and abscess of trunk 09/15/2009   Ceruminosis 03/04/2014   Cervicalgia 06/20/2007   Chronic low back pain 06/20/2007   Chronic midline low back pain with  left-sided sciatica 08/06/2012   COPD (chronic obstructive pulmonary disease) (HCC)    Deformity of finger 07/15/2014   Degeneration of lumbar intervertebral disc 08/22/2020   Degenerative lumbar spinal stenosis    Dermatitis 06/02/2015   Diastolic dysfunction    Per pt, diagnosed after [facility]   Diverticulosis    DOE (dyspnea on exertion) 07/20/2016   Essential (primary) hypertension 04/23/2007   Estrogen deficiency 09/01/2017   External hemorrhoid 08/01/2015   Fibromyalgia    Foraminal stenosis of lumbar region 08/22/2020   Generalized anxiety disorder 09/01/2018   Hyperlipidemia    Inflammatory disease of uterus 04/23/2007   Left flank pain 07/13/2021   Left upper quadrant pain 09/01/2017   Lower abdominal pain 08/06/2012   Lumbar back pain with radiculopathy affecting left lower extremity 01/24/2010   Lumbar radiculopathy 01/26/2021   Lump in upper inner quadrant of left breast 07/13/2021   Major depressive disorder    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   Otitis media 09/05/2010   Palpitations 07/20/2016   Peripheral neuropathy 02/22/2010   Retrolisthesis of vertebrae 08/22/2020   Shingles outbreak 04/14/2015   Sinusitis, acute maxillary 09/14/2013   Stress incontinence, female 08/01/2015   Thoracic aortic atherosclerosis 05/14/2017   Thyroid disease    Tinea corporis 09/01/2017   Tinnitus 03/04/2014   Torticollis 03/04/2014   Type II diabetes mellitus 06/05/2018   Past Surgical History:  Procedure Laterality Date   APPENDECTOMY     EYE SURGERY     KNEE ARTHROSCOPY Right 11/12/2006   lumbar spine proceedure     TONSILLECTOMY     TOTAL ABDOMINAL HYSTERECTOMY     Social History   Tobacco Use   Smoking status: Former    Current packs/day: 0.00    Average packs/day: 1.5 packs/day for 40.0 years (60.0 ttl pk-yrs)    Types: Cigarettes    Start date: 07/02/1964    Quit date: 07/02/2004    Years since quitting: 19.1    Passive  exposure: Past   Smokeless tobacco: Never   Tobacco comments:    Verified by Adrian Saran (Husband), & Nelia Shi (Daughter)  Vaping Use   Vaping status: Never Used  Substance Use Topics   Alcohol use: Yes    Alcohol/week: 0.0 - 1.0 standard drinks of alcohol    Comment: occasional wine   Drug use: No   Social History   Socioeconomic History   Marital status: Married    Spouse name: Othell Diluzio   Number of children: 3   Years of education: 14   Highest education level: Some college, no degree  Occupational History   Occupation: retired  Tobacco Use   Smoking status: Former    Current packs/day: 0.00    Average packs/day: 1.5 packs/day for 40.0 years (60.0 ttl pk-yrs)    Types: Cigarettes    Start date: 07/02/1964    Quit date: 07/02/2004    Years since quitting: 19.1    Passive exposure: Past   Smokeless tobacco: Never   Tobacco comments:    Verified by Adrian Saran (Husband), & Nelia Shi (Daughter)  Vaping Use   Vaping status: Never Used  Substance and Sexual Activity   Alcohol use: Yes    Alcohol/week: 0.0 -  1.0 standard drinks of alcohol    Comment: occasional wine   Drug use: No   Sexual activity: Never  Other Topics Concern   Not on file  Social History Narrative   Lives with husband and grandson she is raising.   11/07/21 Lives with husband   Social Determinants of Health   Financial Resource Strain: Medium Risk (11/29/2021)   Overall Financial Resource Strain (CARDIA)    Difficulty of Paying Living Expenses: Somewhat hard  Food Insecurity: No Food Insecurity (12/28/2022)   Hunger Vital Sign    Worried About Running Out of Food in the Last Year: Never true    Ran Out of Food in the Last Year: Never true  Transportation Needs: No Transportation Needs (12/28/2022)   PRAPARE - Administrator, Civil Service (Medical): No    Lack of Transportation (Non-Medical): No  Physical Activity: Inactive (11/29/2021)   Exercise Vital Sign    Days  of Exercise per Week: 0 days    Minutes of Exercise per Session: 0 min  Stress: No Stress Concern Present (11/29/2021)   [employer] of Occupational Health - Occupational Stress Questionnaire    Feeling of Stress : Not at all  Social Connections: Socially Integrated (11/29/2021)   Social Connection and Isolation Panel [NHANES]    Frequency of Communication with Friends and Family: More than three times a week    Frequency of Social Gatherings with Friends and Family: More than three times a week    Attends Religious Services: 1 to 4 times per year    Active Member of [employer] or Organizations: Yes    Attends Engineer, structural: More than 4 times per year    Marital Status: Married  Catering manager Violence: Not At Risk (11/29/2021)   Humiliation, Afraid, Rape, and Kick questionnaire    Fear of Current or Ex-Partner: No    Emotionally Abused: No    Physically Abused: No    Sexually Abused: No   Family Status  Relation Name Status   Mother  Deceased   Father  Deceased   Sister  Deceased   Mat Aunt  Deceased   Mat Aunt  (Not Specified)   Oceanographer  (Not Specified)   Neg Hx  (Not Specified)  No partnership data on file   Family History  Problem Relation Age of Onset   Hypertension Mother    Hiatal hernia Mother    Diabetes Father    Hypertension Father    Heart attack Father    Alcoholism Father    Bipolar disorder Father    Mental illness Sister    Suicidality Sister    Colon cancer Maternal Aunt 26   Breast cancer Maternal Aunt    Bipolar disorder Paternal Aunt    Esophageal cancer Neg Hx    Rectal cancer Neg Hx    Allergies  Allergen Reactions   Atorvastatin Other (See Comments)    Significant rise in liver tests   Codeine Nausea And Vomiting   Diclofenac Other (See Comments)    Elevated LFT's   Doxycycline     Extreme nausea   Fluconazole Other (See Comments)    Hallucinations    Sulfonamide Derivatives Swelling      Review of Systems   Constitutional:  Positive for malaise/fatigue. Negative for fever.  HENT:  Negative for congestion.   Eyes:  Negative for blurred vision.  Respiratory:  Negative for cough and shortness of breath.   Cardiovascular:  Negative for  chest pain, palpitations and leg swelling.  Gastrointestinal:  Negative for vomiting.  Musculoskeletal:  Negative for back pain.  Skin:  Negative for rash.  Neurological:  Negative for loss of consciousness and headaches.  Psychiatric/Behavioral:  Positive for depression and memory loss. Negative for substance abuse and suicidal ideas. The patient has insomnia.       Objective:     BP 128/88 (BP Location: Left Arm, Patient Position: Sitting, Cuff Size: Large)   Pulse 82   Temp 98.7 F (37.1 C) (Oral)   Resp 18   Ht 5\' 2"  (1.575 m)   SpO2 95%   BMI 23.78 kg/m  BP Readings from Last 3 Encounters:  08/20/23 128/88  05/30/23 (!) 140/88  05/21/23 (!) 160/79   Wt Readings from Last 3 Encounters:  05/21/23 130 lb (59 kg)  04/25/23 162 lb (73.5 kg)  03/14/23 162 lb (73.5 kg)   SpO2 Readings from Last 3 Encounters:  08/20/23 95%  05/30/23 95%  05/21/23 98%      Physical Exam Vitals and nursing note reviewed.  Constitutional:      General: She is not in acute distress.    Appearance: Normal appearance. She is well-developed. She is not ill-appearing.  HENT:     Head: Normocephalic and atraumatic.  Eyes:     General: No scleral icterus.       Right eye: No discharge.        Left eye: No discharge.  Cardiovascular:     Rate and Rhythm: Normal rate and regular rhythm.     Heart sounds: No murmur heard. Pulmonary:     Effort: Pulmonary effort is normal. No respiratory distress.     Breath sounds: Normal breath sounds.  Musculoskeletal:        General: Normal range of motion.     Cervical back: Normal range of motion and neck supple.     Right lower leg: No edema.     Left lower leg: No edema.  Skin:    General: Skin is warm and dry.   Neurological:     Mental Status: She is alert and oriented to person, place, and time.     Motor: Weakness present.     Gait: Gait abnormal.  Psychiatric:        Mood and Affect: Mood normal.        Behavior: Behavior is withdrawn. Behavior is cooperative.        Thought Content: Thought content normal. Thought content is not paranoid. Thought content does not include homicidal or suicidal ideation.        Cognition and Memory: Cognition is impaired. Memory is impaired.        Judgment: Judgment normal.      No results found for any visits on 08/20/23.  Last CBC Lab Results  Component Value Date   WBC 8.5 12/27/2022   HGB 14.0 12/27/2022   HCT 41.8 12/27/2022   MCV 94.5 12/27/2022   MCH 31.0 08/01/2022   RDW 14.7 12/27/2022   PLT 376.0 12/27/2022   Last metabolic panel Lab Results  Component Value Date   GLUCOSE 99 12/27/2022   NA 143 12/27/2022   K 4.7 12/27/2022   CL 103 12/27/2022   CO2 29 12/27/2022   BUN 13 12/27/2022   CREATININE 1.10 12/27/2022   GFR 48.76 (L) 12/27/2022   CALCIUM 10.0 12/27/2022   PROT 6.9 12/27/2022   ALBUMIN 4.3 12/27/2022   BILITOT 0.4 12/27/2022   ALKPHOS 54 12/27/2022  AST 15 12/27/2022   ALT 10 12/27/2022   ANIONGAP 10 08/01/2022   Last lipids Lab Results  Component Value Date   CHOL 231 (H) 12/27/2022   HDL 95.60 12/27/2022   LDLCALC 115 (H) 12/27/2022   LDLDIRECT 120.1 12/03/2013   TRIG 101.0 12/27/2022   CHOLHDL 2 12/27/2022   Last hemoglobin A1c Lab Results  Component Value Date   HGBA1C 5.7 12/27/2022   Last thyroid functions Lab Results  Component Value Date   TSH 2.27 12/27/2022   T4TOTAL 6.7 06/02/2019   Last vitamin D No results found for: "25OHVITD2", "25OHVITD3", "VD25OH" Last vitamin B12 and Folate Lab Results  Component Value Date   VITAMINB12 1,132 (H) 10/23/2021      The 10-year ASCVD risk score (Arnett DK, et al., 2019) is: 45.6%    Assessment & Plan:   Problem List Items Addressed This  Visit       Unprioritized   Type II diabetes mellitus    hgba1c to be checked , minimize simple carbs. Increase exercise as tolerated. Continue current meds       Relevant Orders   Hemoglobin A1c   Neurocognitive disorder - Primary    Worsening  Pt did not want do do a mri brain at this time  They are requesting to see a neurologist       Memory loss    Worsening over the last few weeks Start aricept Referral to neurology        Relevant Medications   donepezil (ARICEPT) 5 MG tablet   Other Relevant Orders   Ambulatory referral to Neurology   Hypothyroidism    Check labs       Relevant Orders   TSH   Hyperlipidemia associated with type 2 diabetes mellitus (HCC)    Tolerating statin, encouraged heart healthy diet, avoid trans fats, minimize simple carbs and saturated fats. Increase exercise as tolerated       Relevant Orders   Comprehensive metabolic panel   Lipid panel   Foraminal stenosis of lumbar region    Per pain management      Essential (primary) hypertension    Well controlled, no changes to meds. Encouraged heart healthy diet such as the DASH diet and exercise as tolerated.        Bipolar 2 disorder    Per psych      Other Visit Diagnoses     Need for influenza vaccination       Relevant Orders   Flu Vaccine Trivalent High Dose (Fluad) (Completed)   Primary hypertension       Relevant Orders   CBC with Differential/Platelet     Assessment and Plan            No follow-ups on file.    Donato Schultz, DO

## 2023-08-20 NOTE — Assessment & Plan Note (Signed)
Well controlled, no changes to meds. Encouraged heart healthy diet such as the DASH diet and exercise as tolerated.  °

## 2023-08-20 NOTE — Assessment & Plan Note (Signed)
Check labs 

## 2023-08-20 NOTE — Assessment & Plan Note (Signed)
Per pain management.  

## 2023-08-21 ENCOUNTER — Other Ambulatory Visit: Payer: Self-pay | Admitting: Physician Assistant

## 2023-08-21 DIAGNOSIS — K117 Disturbances of salivary secretion: Secondary | ICD-10-CM

## 2023-08-21 NOTE — Telephone Encounter (Signed)
This is for a future refill

## 2023-08-22 ENCOUNTER — Ambulatory Visit: Payer: Medicare Other | Admitting: Psychiatry

## 2023-08-26 ENCOUNTER — Other Ambulatory Visit: Payer: Self-pay | Admitting: Family Medicine

## 2023-08-27 ENCOUNTER — Other Ambulatory Visit: Payer: Self-pay | Admitting: Physician Assistant

## 2023-08-28 ENCOUNTER — Ambulatory Visit: Payer: Medicare Other | Admitting: Psychiatry

## 2023-08-30 ENCOUNTER — Other Ambulatory Visit: Payer: Self-pay | Admitting: Physician Assistant

## 2023-09-02 ENCOUNTER — Other Ambulatory Visit: Payer: Self-pay | Admitting: Physician Assistant

## 2023-09-02 DIAGNOSIS — G2401 Drug induced subacute dyskinesia: Secondary | ICD-10-CM

## 2023-09-02 MED ORDER — AUSTEDO XR 42 MG PO TB24
42.0000 mg | ORAL_TABLET | Freq: Every day | ORAL | 1 refills | Status: DC
Start: 2023-09-02 — End: 2023-10-16

## 2023-09-03 DIAGNOSIS — M47816 Spondylosis without myelopathy or radiculopathy, lumbar region: Secondary | ICD-10-CM | POA: Diagnosis not present

## 2023-09-12 ENCOUNTER — Encounter: Payer: Self-pay | Admitting: Family Medicine

## 2023-09-12 ENCOUNTER — Other Ambulatory Visit: Payer: Self-pay | Admitting: Psychiatry

## 2023-09-12 ENCOUNTER — Ambulatory Visit: Payer: Medicare Other | Admitting: Family Medicine

## 2023-09-12 VITALS — BP 120/88 | HR 76 | Temp 98.3°F | Resp 18 | Ht 62.0 in | Wt 121.2 lb

## 2023-09-12 DIAGNOSIS — K117 Disturbances of salivary secretion: Secondary | ICD-10-CM

## 2023-09-12 DIAGNOSIS — R419 Unspecified symptoms and signs involving cognitive functions and awareness: Secondary | ICD-10-CM

## 2023-09-12 DIAGNOSIS — G629 Polyneuropathy, unspecified: Secondary | ICD-10-CM | POA: Diagnosis not present

## 2023-09-12 DIAGNOSIS — R251 Tremor, unspecified: Secondary | ICD-10-CM | POA: Diagnosis not present

## 2023-09-12 DIAGNOSIS — R110 Nausea: Secondary | ICD-10-CM | POA: Diagnosis not present

## 2023-09-12 DIAGNOSIS — R413 Other amnesia: Secondary | ICD-10-CM | POA: Diagnosis not present

## 2023-09-12 DIAGNOSIS — R1310 Dysphagia, unspecified: Secondary | ICD-10-CM | POA: Diagnosis not present

## 2023-09-12 MED ORDER — ONDANSETRON HCL 4 MG PO TABS
4.0000 mg | ORAL_TABLET | Freq: Three times a day (TID) | ORAL | 2 refills | Status: DC | PRN
Start: 2023-09-12 — End: 2023-11-25

## 2023-09-12 NOTE — Patient Instructions (Signed)
Barium Swallow A barium swallow is an X-ray exam that is used to evaluate the upper part of your gastrointestinal system, including: The area at the back of your throat (pharynx). The part of the body that moves food from your mouth to your stomach (esophagus). Your stomach. For this exam, you will swallow a white, chalky liquid called barium. X-rays will be done while the barium passes through the areas that are being checked. The barium shows up well on X-rays, making it easier for your health care provider to see possible problems. A barium swallow may be done to check for various problems, such as: Ulcers. Tumors. Inflammation of the esophagus. Hiatal hernia. This is a condition in which the upper part of the stomach slides into the lower chest. Scarring. Blockages. Problems with the muscular wall of the pharynx and esophagus. Your health care provider may recommend this procedure to help make a diagnosis if you have any of these symptoms: Difficulty swallowing. Chest pain that is not related to your heart. Gastroesophageal reflux, which is a backward flow of stomach contents into the esophagus. Unexplained vomiting. Severe indigestion. Tell a health care provider about: Any allergies you have, especially allergies to substances that are used in certain imaging tests (contrast materials). All medicines you are taking, including vitamins, herbs, eye drops, creams, and over-the-counter medicines. Any surgeries you have had. Any medical conditions you have. Whether you are pregnant or may be pregnant. What are the risks? Generally, this is a safe procedure. However, problems may occur, including: Constipation. Fecal impaction. This is when hardened stool (feces) gets stuck in the large intestine (colon) or rectum. Exposure to radiation (a small amount). Allergic reaction to the barium. This is rare. What happens before the procedure? Follow instructions from your health care  provider about eating or drinking restrictions. Ask your health care provider about: Changing or stopping your regular medicines. This is especially important if you are taking diabetes medicines or blood thinners. Taking medicines such as aspirin and ibuprofen. These medicines can thin your blood. Do not take these medicines unless your health care provider tells you to take them. Taking over-the-counter medicines, vitamins, herbs, and supplements. What happens during the procedure? You will be positioned on an X-ray table. While X-ray images are being taken, you will be asked to drink the liquid barium, which looks like a light-colored milkshake. During the procedure, the X-ray table may be moved to a more upright angle. You may also be asked to shift your position on the table. Doing that will allow the health care team to view your entire esophagus. The health care provider will watch the barium flow through your esophagus. Your provider will do this using a type of X-ray that allows images to be viewed on a monitor (fluoroscopy). X-ray images will also be stored for later viewing. The procedure may vary among health care providers and hospitals. What happens after the procedure? Your stool may be white or gray for 2-3 days until all of the barium has passed out of your body in your stool. You may be given a laxative to help remove the barium from your body. It is up to you to get the results of your procedure. Ask your health care provider, or the department that is doing the procedure, when your results will be ready. Talk with your health care provider about what your results mean. Follow these instructions at home:  Return to your normal activities and your normal diet as told by your  health care provider. Drink enough fluid to keep your urine pale yellow. This will help to rinse (flush) barium out of your body. Ask your health care provider if the medicine prescribed to you: Requires you  to avoid driving or using machinery. Can cause constipation. You may need to take these actions to prevent or treat constipation: Take over-the-counter or prescription medicines. Eat foods that are high in fiber, such as beans, whole grains, and fresh fruits and vegetables. Limit foods that are high in fat and processed sugars, such as fried or sweet foods. Contact a health care provider if: You have difficulty with bowel movements. This may involve not being able to have a bowel movement or pass gas. You have pain or swelling in your abdomen. You have a fever. Summary A barium swallow is an X-ray exam that is used to evaluate the area at the back of your throat (pharynx), the part of your body that moves food from your mouth to your stomach (esophagus), and your stomach. For this exam, you will swallow a white, chalky liquid called barium. X-rays will be done while the barium passes through the areas that are being checked. Your health care provider may recommend this procedure if you have symptoms such as difficulty swallowing, chest pain, reflux, vomiting, or indigestion. Talk with your health care provider about what your results mean. This information is not intended to replace advice given to you by your health care provider. Make sure you discuss any questions you have with your health care provider. Document Revised: 10/23/2021 Document Reviewed: 10/23/2021 Elsevier Patient Education  2024 [employer].

## 2023-09-12 NOTE — Telephone Encounter (Signed)
MINIPRESS AND HALDOL were just ordered last week; Atropine was ordered two weeks ago, is used prn.

## 2023-09-12 NOTE — Progress Notes (Signed)
Established Patient Office Visit  Subjective   Patient ID: Megan Duncan, female    DOB: 02/02/1946  Age: 77 y.o. MRN: [id number]  Chief Complaint  Patient presents with   Weakness    HPI Discussed the use of AI scribe software for clinical note transcription with the patient, who gave verbal consent to proceed.  History of Present Illness   The patient, with a history of neuropathy and tremors, presents with worsening of these symptoms. She also reports new issues with her feet and legs, primarily at night, characterized by numbness and irritability. The patient's caregiver reports that the patient has been moving her legs a lot, suggesting restlessness.  The patient also reports difficulty swallowing, which has led to a significant decrease in food intake and subsequent weight loss. The patient's caregiver reports that the patient's weight has dropped to 121 lbs. The patient has been trying to supplement her diet with Ensure shakes and has been able to consume soft foods like scrambled eggs and toast. However, any kind of meat is difficult for the patient to chew and swallow.  The patient also experiences nausea, for which she has been taking ondansetron. The patient's caregiver reports that the patient does not need the medication every day. The patient also takes eye drops for drooling, which she finds unpleasant due to the taste.  The patient's caregiver expresses concern about the patient's care at home, indicating that the patient spends most of the day sitting in a chair and needs help going to the bathroom at night. The caregiver is seeking palliative care to help manage the patient's care at home.      Patient Active Problem List   Diagnosis Date Noted   Nausea 12/27/2022   Hypothyroidism 12/27/2022   Acute vaginitis 06/23/2022   Rash 04/29/2022   Altered mental status 12/03/2021   Anxiety 11/28/2021   Memory loss 10/26/2021   Bilateral hearing loss 10/26/2021    Neurocognitive disorder 09/07/2021   Degenerative lumbar spinal stenosis    Anterolisthesis of lumbar spine    Lump in upper inner quadrant of left breast 07/13/2021   Left flank pain 07/13/2021   Lumbar radiculopathy 01/26/2021   Body mass index (BMI) 36.0-36.9, adult 01/11/2021   Degeneration of lumbar intervertebral disc 08/22/2020   Retrolisthesis of vertebrae 08/22/2020   Foraminal stenosis of lumbar region 08/22/2020   Generalized anxiety disorder 09/01/2018   Type II diabetes mellitus 06/05/2018   Tinea corporis 09/01/2017   Left upper quadrant pain 09/01/2017   Estrogen deficiency 09/01/2017   Thoracic aortic atherosclerosis 05/14/2017   COPD (chronic obstructive pulmonary disease) 10/30/2016   Allergic rhinitis 10/03/2016   Palpitations 07/20/2016   DOE (dyspnea on exertion) 07/20/2016   External hemorrhoid 08/01/2015   Stress incontinence, female 08/01/2015   Dermatitis 06/02/2015   Shingles outbreak 04/14/2015   Hyperlipidemia associated with type 2 diabetes mellitus (HCC)    Fibromyalgia 10/14/2014   Bilateral hip pain 09/02/2014   Deformity of finger 07/15/2014   Ceruminosis 03/04/2014   Tinnitus 03/04/2014   Torticollis 03/04/2014   Bipolar 2 disorder 12/03/2013   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-sided sciatica 08/06/2012   Lower abdominal pain 08/06/2012   Otitis media 09/05/2010   Peripheral neuropathy 02/22/2010   Lumbar back pain with radiculopathy affecting left lower extremity 01/24/2010   Cervicalgia 06/20/2007   Acute bilateral low back pain without sciatica 06/20/2007   Essential (primary) hypertension 04/23/2007   Inflammatory disease of uterus 04/23/2007   Past Medical  History:  Diagnosis Date   Acute pain of right shoulder 09/24/2018   Acute upper respiratory infection 09/19/2010   Allergic rhinitis 10/03/2016   Anterolisthesis of lumbar spine    Arthritis    hands   Bilateral hip pain 09/02/2014   Bipolar II disorder    Cellulitis  and abscess of trunk 09/15/2009   Ceruminosis 03/04/2014   Cervicalgia 06/20/2007   Chronic low back pain 06/20/2007   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-sided sciatica 08/06/2012   COPD (chronic obstructive pulmonary disease) (HCC)    Deformity of finger 07/15/2014   Degeneration of lumbar intervertebral disc 08/22/2020   Degenerative lumbar spinal stenosis    Dermatitis 06/02/2015   Diastolic dysfunction    Per pt, diagnosed after [facility]   Diverticulosis    DOE (dyspnea on exertion) 07/20/2016   Essential (primary) hypertension 04/23/2007   Estrogen deficiency 09/01/2017   External hemorrhoid 08/01/2015   Fibromyalgia    Foraminal stenosis of lumbar region 08/22/2020   Generalized anxiety disorder 09/01/2018   Hyperlipidemia    Inflammatory disease of uterus 04/23/2007   Left flank pain 07/13/2021   Left upper quadrant pain 09/01/2017   Lower abdominal pain 08/06/2012   Lumbar back pain with radiculopathy affecting left lower extremity 01/24/2010   Lumbar radiculopathy 01/26/2021   Lump in upper inner quadrant of left breast 07/13/2021   Major depressive disorder    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   Otitis media 09/05/2010   Palpitations 07/20/2016   Peripheral neuropathy 02/22/2010   Retrolisthesis of vertebrae 08/22/2020   Shingles outbreak 04/14/2015   Sinusitis, acute maxillary 09/14/2013   Stress incontinence, female 08/01/2015   Thoracic aortic atherosclerosis 05/14/2017   Thyroid disease    Tinea corporis 09/01/2017   Tinnitus 03/04/2014   Torticollis 03/04/2014   Type II diabetes mellitus 06/05/2018   Past Surgical History:  Procedure Laterality Date   APPENDECTOMY     EYE SURGERY     KNEE ARTHROSCOPY Right 11/12/2006   lumbar spine proceedure     TONSILLECTOMY     TOTAL ABDOMINAL HYSTERECTOMY     Social History   Tobacco Use   Smoking status: Former    Current packs/day: 0.00    Average packs/day: 1.5  packs/day for 40.0 years (60.0 ttl pk-yrs)    Types: Cigarettes    Start date: 07/02/1964    Quit date: 07/02/2004    Years since quitting: 19.2    Passive exposure: Past   Smokeless tobacco: Never   Tobacco comments:    Verified by Adrian Saran (Husband), & Nelia Shi (Daughter)  Vaping Use   Vaping status: Never Used  Substance Use Topics   Alcohol use: Yes    Alcohol/week: 0.0 - 1.0 standard drinks of alcohol    Comment: occasional wine   Drug use: No   Social History   Socioeconomic History   Marital status: Married    Spouse name: Ronnisha Tatge   Number of children: 3   Years of education: 14   Highest education level: Some college, no degree  Occupational History   Occupation: retired  Tobacco Use   Smoking status: Former    Current packs/day: 0.00    Average packs/day: 1.5 packs/day for 40.0 years (60.0 ttl pk-yrs)    Types: Cigarettes    Start date: 07/02/1964    Quit date: 07/02/2004    Years since quitting: 19.2    Passive exposure: Past   Smokeless tobacco: Never   Tobacco  comments:    Verified by Adrian Saran (Husband), & Nelia Shi (Daughter)  Vaping Use   Vaping status: Never Used  Substance and Sexual Activity   Alcohol use: Yes    Alcohol/week: 0.0 - 1.0 standard drinks of alcohol    Comment: occasional wine   Drug use: No   Sexual activity: Never  Other Topics Concern   Not on file  Social History Narrative   Lives with husband and grandson she is raising.   11/07/21 Lives with husband   Social Determinants of Health   Financial Resource Strain: Medium Risk (11/29/2021)   Overall Financial Resource Strain (CARDIA)    Difficulty of Paying Living Expenses: Somewhat hard  Food Insecurity: No Food Insecurity (12/28/2022)   Hunger Vital Sign    Worried About Running Out of Food in the Last Year: Never true    Ran Out of Food in the Last Year: Never true  Transportation Needs: No Transportation Needs (12/28/2022)   PRAPARE - Therapist, art (Medical): No    Lack of Transportation (Non-Medical): No  Physical Activity: Inactive (11/29/2021)   Exercise Vital Sign    Days of Exercise per Week: 0 days    Minutes of Exercise per Session: 0 min  Stress: No Stress Concern Present (11/29/2021)   [employer] of Occupational Health - Occupational Stress Questionnaire    Feeling of Stress : Not at all  Social Connections: Socially Integrated (11/29/2021)   Social Connection and Isolation Panel [NHANES]    Frequency of Communication with Friends and Family: More than three times a week    Frequency of Social Gatherings with Friends and Family: More than three times a week    Attends Religious Services: 1 to 4 times per year    Active Member of [employer] or Organizations: Yes    Attends Engineer, structural: More than 4 times per year    Marital Status: Married  Catering manager Violence: Not At Risk (11/29/2021)   Humiliation, Afraid, Rape, and Kick questionnaire    Fear of Current or Ex-Partner: No    Emotionally Abused: No    Physically Abused: No    Sexually Abused: No   Family Status  Relation Name Status   Mother  Deceased   Father  Deceased   Sister  Deceased   Mat Aunt  Deceased   Mat Aunt  (Not Specified)   Oceanographer  (Not Specified)   Neg Hx  (Not Specified)  No partnership data on file   Family History  Problem Relation Age of Onset   Hypertension Mother    Hiatal hernia Mother    Diabetes Father    Hypertension Father    Heart attack Father    Alcoholism Father    Bipolar disorder Father    Mental illness Sister    Suicidality Sister    Colon cancer Maternal Aunt 14   Breast cancer Maternal Aunt    Bipolar disorder Paternal Aunt    Esophageal cancer Neg Hx    Rectal cancer Neg Hx    Allergies  Allergen Reactions   Atorvastatin Other (See Comments)    Significant rise in liver tests   Codeine Nausea And Vomiting   Diclofenac Other (See Comments)    Elevated LFT's    Doxycycline     Extreme nausea   Fluconazole Other (See Comments)    Hallucinations    Sulfonamide Derivatives Swelling      Review of Systems  Constitutional:  Negative for fever and malaise/fatigue.  HENT:  Negative for congestion.   Eyes:  Negative for blurred vision.  Respiratory:  Negative for cough and shortness of breath.   Cardiovascular:  Negative for chest pain, palpitations and leg swelling.  Gastrointestinal:  Negative for abdominal pain, blood in stool, nausea and vomiting.  Genitourinary:  Negative for dysuria and frequency.  Musculoskeletal:  Positive for back pain. Negative for falls.  Skin:  Negative for rash.  Neurological:  Positive for tingling, tremors and weakness. Negative for dizziness, loss of consciousness and headaches.  Endo/Heme/Allergies:  Negative for environmental allergies.  Psychiatric/Behavioral:  Positive for depression. The patient has insomnia. The patient is not nervous/anxious.       Objective:     BP 120/88 (BP Location: Left Arm, Patient Position: Sitting, Cuff Size: Normal)   Pulse 76   Temp 98.3 F (36.8 C) (Oral)   Resp 18   Ht 5\' 2"  (1.575 m)   Wt 121 lb 3.2 oz (55 kg)   SpO2 94%   BMI 22.17 kg/m  BP Readings from Last 3 Encounters:  09/12/23 120/88  08/20/23 128/88  05/30/23 (!) 140/88   Wt Readings from Last 3 Encounters:  09/12/23 121 lb 3.2 oz (55 kg)  05/21/23 130 lb (59 kg)  04/25/23 162 lb (73.5 kg)   SpO2 Readings from Last 3 Encounters:  09/12/23 94%  08/20/23 95%  05/30/23 95%      Physical Exam Vitals and nursing note reviewed.  Constitutional:      General: She is awake. She is not in acute distress.    Appearance: She is underweight.  HENT:     Head: Normocephalic and atraumatic.     Right Ear: Tympanic membrane, ear canal and external ear normal. There is no impacted cerumen.     Left Ear: Tympanic membrane, ear canal and external ear normal. There is no impacted cerumen.     Nose: Nose normal.      Mouth/Throat:     Mouth: Mucous membranes are moist.     Pharynx: Oropharynx is clear. No oropharyngeal exudate or posterior oropharyngeal erythema.  Eyes:     General: No scleral icterus.       Right eye: No discharge.        Left eye: No discharge.     Conjunctiva/sclera: Conjunctivae normal.     Pupils: Pupils are equal, round, and reactive to light.  Neck:     Thyroid: No thyromegaly or thyroid tenderness.     Vascular: No JVD.  Cardiovascular:     Rate and Rhythm: Normal rate and regular rhythm.     Heart sounds: Normal heart sounds. No murmur heard. Pulmonary:     Effort: Pulmonary effort is normal. No respiratory distress.     Breath sounds: Normal breath sounds.  Abdominal:     General: Bowel sounds are normal. There is no distension.     Palpations: Abdomen is soft. There is no mass.     Tenderness: There is no abdominal tenderness. There is no guarding or rebound.  Genitourinary:    Vagina: Normal.  Musculoskeletal:        General: Normal range of motion.     Cervical back: Normal range of motion and neck supple.     Right lower leg: No edema.     Left lower leg: No edema.  Lymphadenopathy:     Cervical: No cervical adenopathy.  Skin:    General: Skin is warm and dry.  Findings: No erythema or rash.  Neurological:     Mental Status: Mental status is at baseline.     Cranial Nerves: No cranial nerve deficit.     Motor: Tremor present.     Gait: Gait abnormal.     Deep Tendon Reflexes: Reflexes are normal and symmetric.     Comments: Uses walker at home to get to bathroom Sits in a chair most of the day    Pt is unsteady on her feet -- high fall risk   Psychiatric:        Mood and Affect: Mood normal.        Behavior: Behavior normal.        Thought Content: Thought content normal.        Judgment: Judgment normal.     No results found for any visits on 09/12/23.  Last CBC Lab Results  Component Value Date   WBC 5.7 08/20/2023   HGB 13.0  08/20/2023   HCT 39.9 08/20/2023   MCV 96.9 08/20/2023   MCH 31.0 08/01/2022   RDW 14.7 08/20/2023   PLT 277.0 08/20/2023   Last metabolic panel Lab Results  Component Value Date   GLUCOSE 91 08/20/2023   NA 143 08/20/2023   K 3.9 08/20/2023   CL 105 08/20/2023   CO2 28 08/20/2023   BUN 14 08/20/2023   CREATININE 1.05 08/20/2023   GFR 51.33 (L) 08/20/2023   CALCIUM 9.6 08/20/2023   PROT 6.2 08/20/2023   ALBUMIN 4.0 08/20/2023   BILITOT 0.6 08/20/2023   ALKPHOS 57 08/20/2023   AST 17 08/20/2023   ALT 13 08/20/2023   ANIONGAP 10 08/01/2022   Last lipids Lab Results  Component Value Date   CHOL 223 (H) 08/20/2023   HDL 91.00 08/20/2023   LDLCALC 115 (H) 08/20/2023   LDLDIRECT 120.1 12/03/2013   TRIG 84.0 08/20/2023   CHOLHDL 2 08/20/2023   Last hemoglobin A1c Lab Results  Component Value Date   HGBA1C 5.2 08/20/2023   Last thyroid functions Lab Results  Component Value Date   TSH 1.88 08/20/2023   T4TOTAL 6.7 06/02/2019   Last vitamin D No results found for: "25OHVITD2", "25OHVITD3", "VD25OH" Last vitamin B12 and Folate Lab Results  Component Value Date   VITAMINB12 1,132 (H) 10/23/2021      The 10-year ASCVD risk score (Arnett DK, et al., 2019) is: 41.2%    Assessment & Plan:   Problem List Items Addressed This Visit       Unprioritized   Nausea   Relevant Medications   ondansetron (ZOFRAN) 4 MG tablet   Other Relevant Orders   SLP modified barium swallow   Neurocognitive disorder   Relevant Orders   Ambulatory referral to Neurology   Amb Referral to Palliative Care   Memory loss - Primary   Relevant Orders   Ambulatory referral to Neurology   Amb Referral to Palliative Care   Other Visit Diagnoses     Tremor       Relevant Orders   Ambulatory referral to Neurology   Amb Referral to Palliative Care   Neuropathy       Relevant Orders   Ambulatory referral to Neurology   Amb Referral to Palliative Care   Dysphagia, unspecified type        Relevant Orders   SLP modified barium swallow     Assessment and Plan    Neuropathy and Tremors Increased leg shaking and numbness, primarily at night. Referral to neurology has been  placed but not yet scheduled. -Ensure completion of referral to neurology for further evaluation and management.  Dysphagia Difficulty swallowing leading to decreased food intake and weight loss. Unclear if this is due to a physical or psychological issue. -Order a swallow study to assess for aspiration risk and determine appropriate diet modifications. -Encourage use of nutritional supplements like Ensure to maintain caloric intake.  Nausea Occasional nausea managed with Ondansetron. -Refill Ondansetron prescription.  Palliative Care Patient's caregiver expressing difficulty managing patient's care at home. -Place referral for palliative care to provide additional support at home.  Skin Lesion Noted growth of a mole on the face. -Monitor for changes and consider referral to dermatology if growth continues or other concerning features develop.        Return if symptoms worsen or fail to improve.    Donato Schultz, DO

## 2023-09-13 ENCOUNTER — Other Ambulatory Visit (HOSPITAL_COMMUNITY): Payer: Self-pay | Admitting: Family Medicine

## 2023-09-13 ENCOUNTER — Encounter: Payer: Self-pay | Admitting: Family Medicine

## 2023-09-13 DIAGNOSIS — R1310 Dysphagia, unspecified: Secondary | ICD-10-CM

## 2023-09-13 DIAGNOSIS — R059 Cough, unspecified: Secondary | ICD-10-CM

## 2023-09-14 IMAGING — DX DG CHEST 1V PORT
1 series · 1 of 1 positions shown · non-contrast
Comparison: Portable chest 11/25/2021 and earlier.

CLINICAL DATA: 75-year-old female with altered mental status.
Confusion, palpitations.

EXAM:
PORTABLE CHEST 1 VIEW

[chest ap]
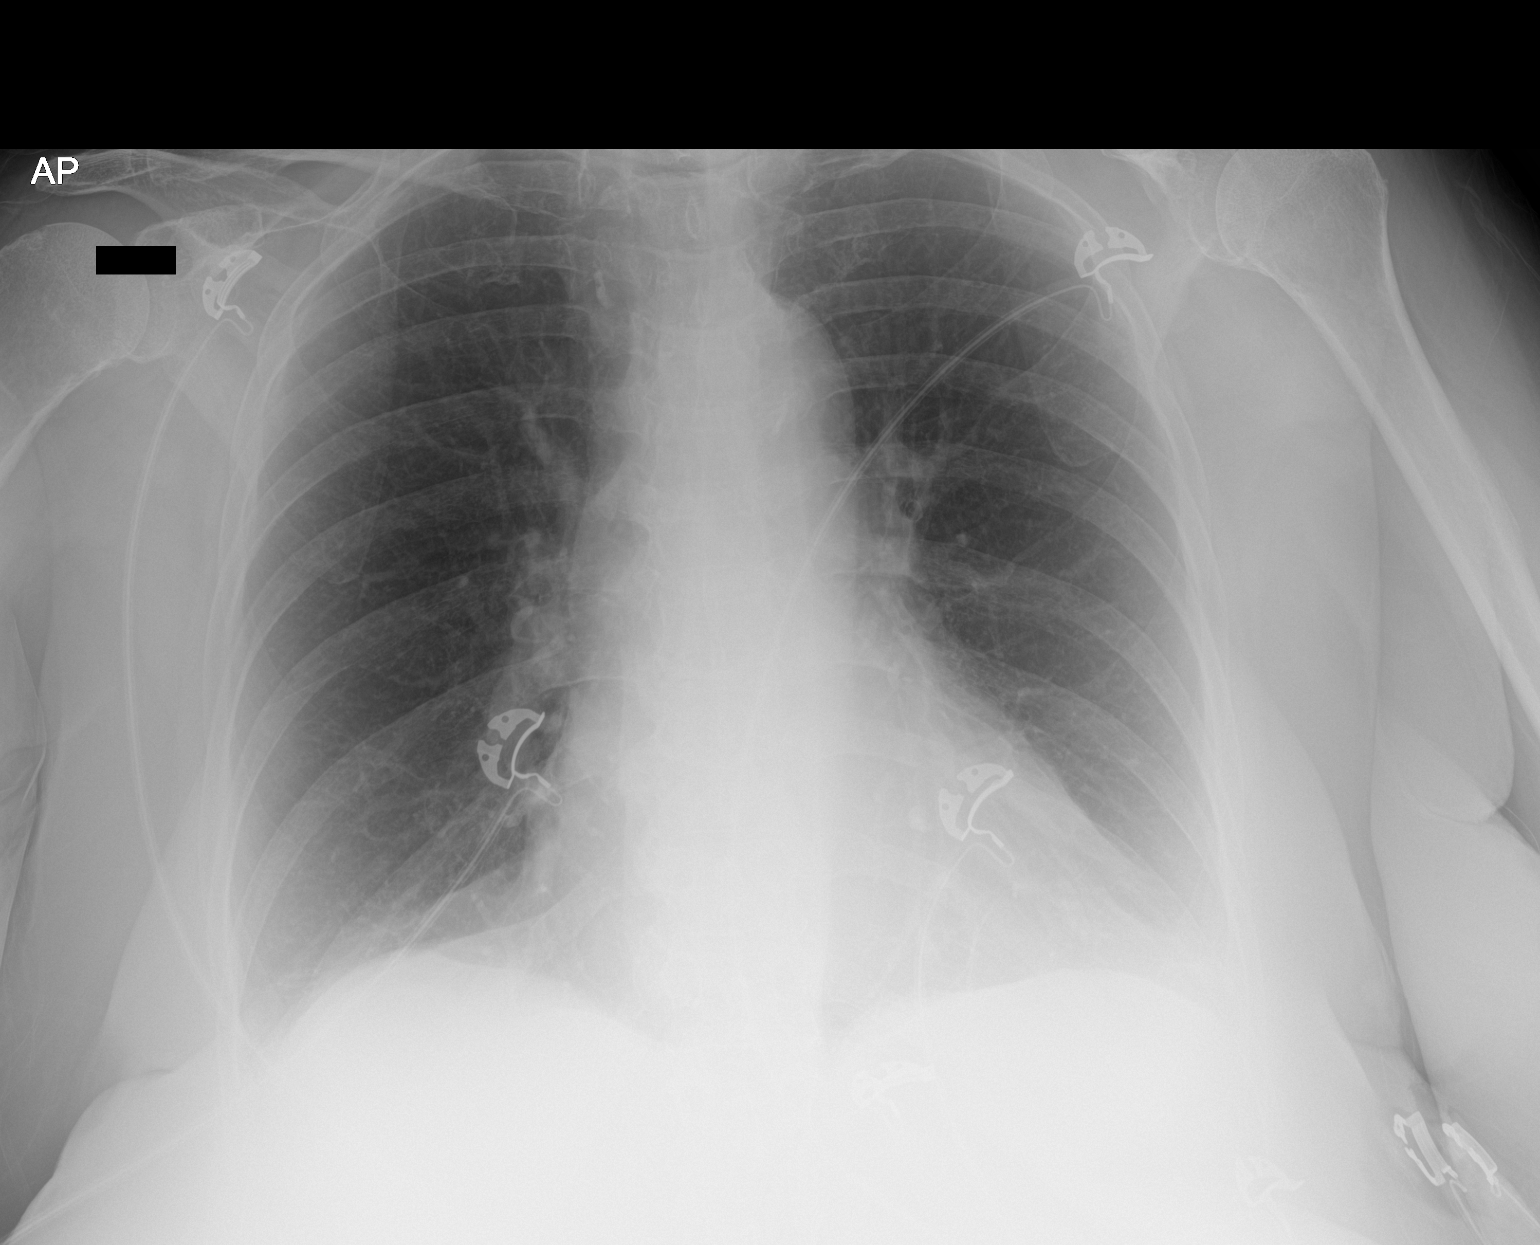

[1 of 1 positions shown; findings below may reference images not displayed]

FINDINGS: Portable AP semi upright view at 5662 hours. Lung volumes and
mediastinal contours remain within normal limits. Allowing for
portable technique the lungs are clear. No pneumothorax or pleural
effusion. No acute osseous abnormality identified. Paucity of bowel
gas in the upper abdomen.
IMPRESSION: Negative portable chest.

## 2023-09-14 IMAGING — CT CT HEAD W/O CM
3 series · 15 of 47 positions shown, 18 images · non-contrast
Comparison: No pertinent prior exams available for comparison.

CLINICAL DATA: Provided history: Mental status change, unknown
cause. Additional history provided: Anxiety, shortness of breath,
palpitations, confusion for 3 days.



[Series 2: head wo · axial · 0.38mm/px · z∈[-193,-68]mm · 9 of 31 slices shown, 12 images]
[im 3/31  brain]
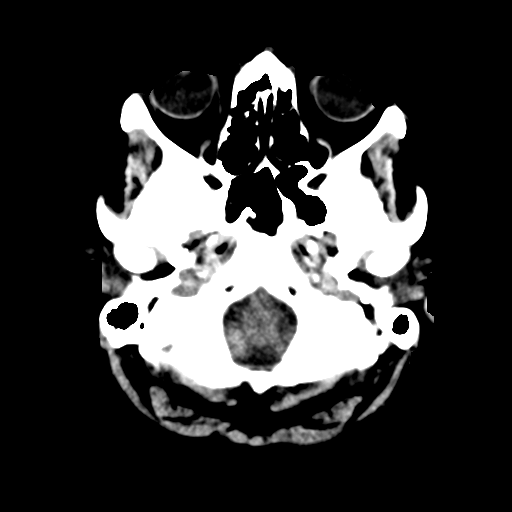
[im 3/31  bone]
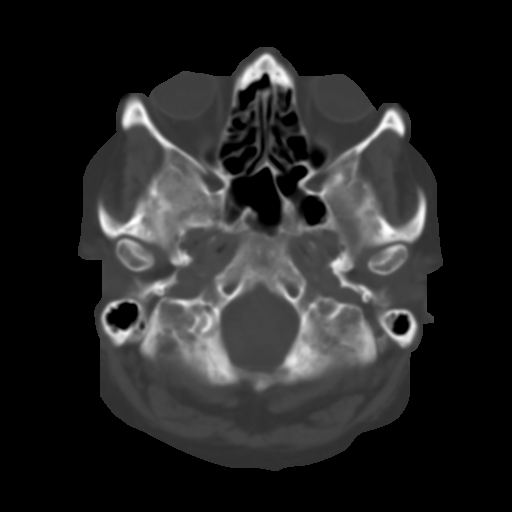
[im 6/31  brain]
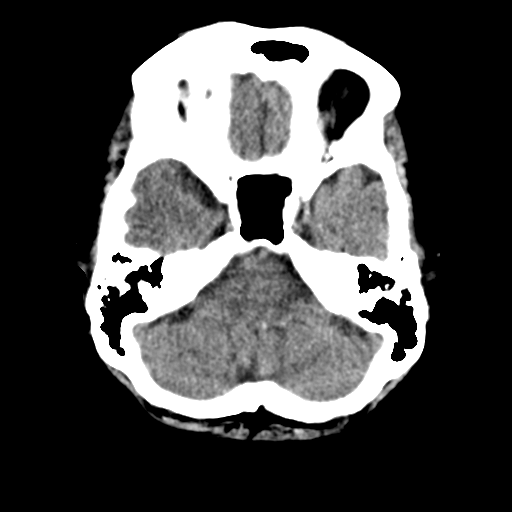
[im 9/31  brain]
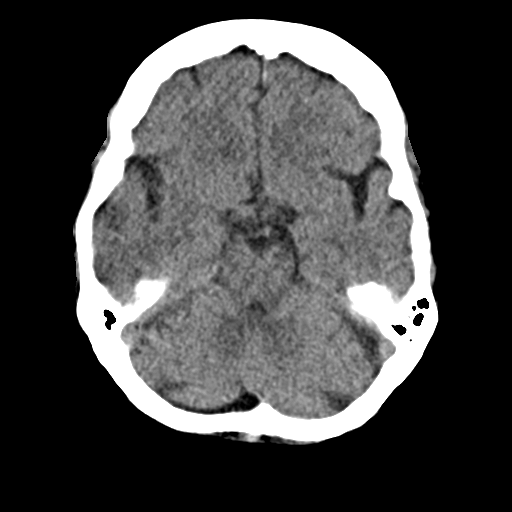
[im 12/31  brain]
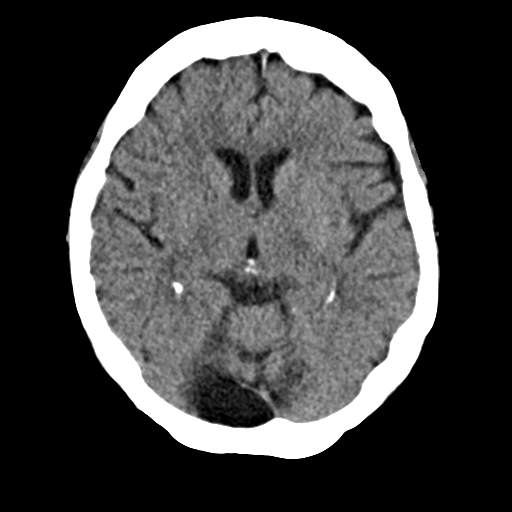
[im 16/31  brain]
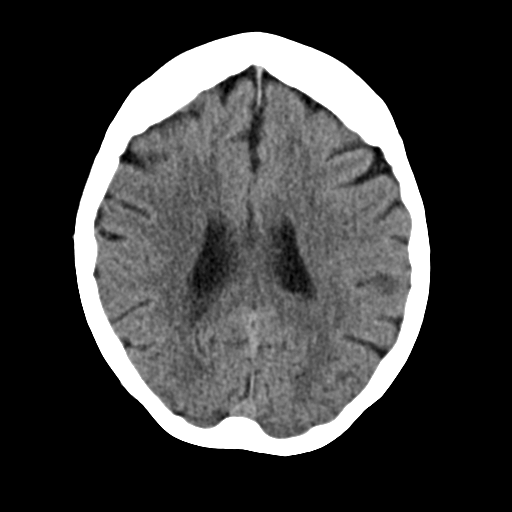
[im 16/31  bone]
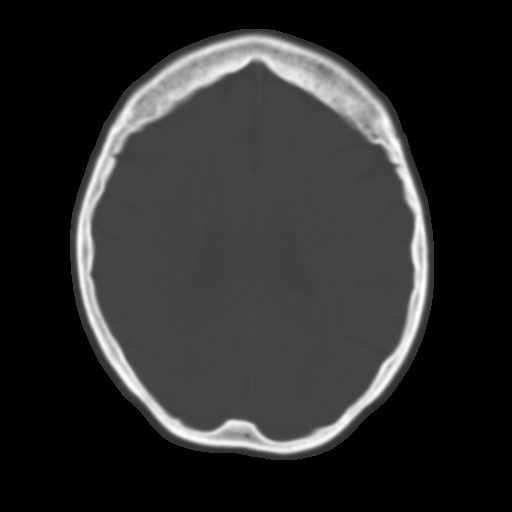
[im 19/31  brain]
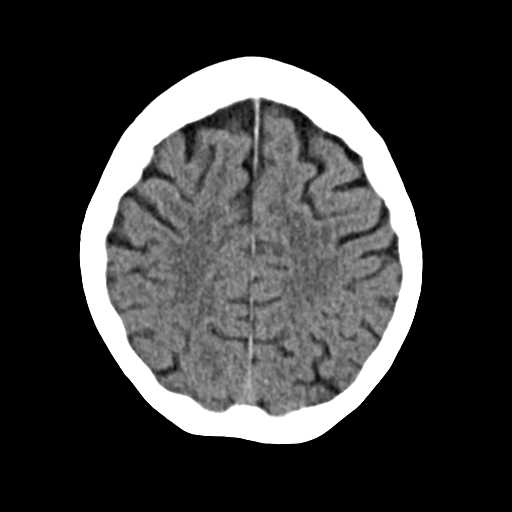
[im 22/31  brain]
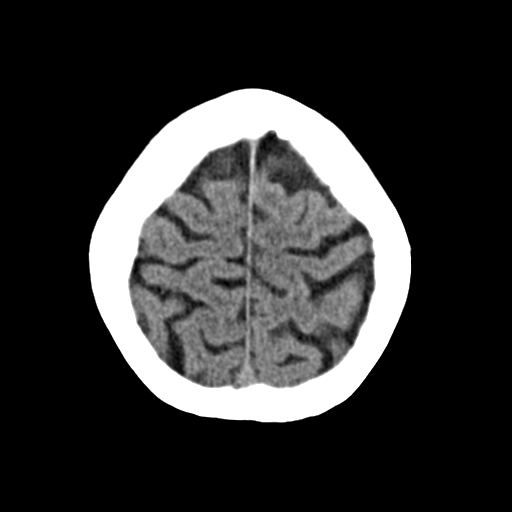
[im 25/31  brain]
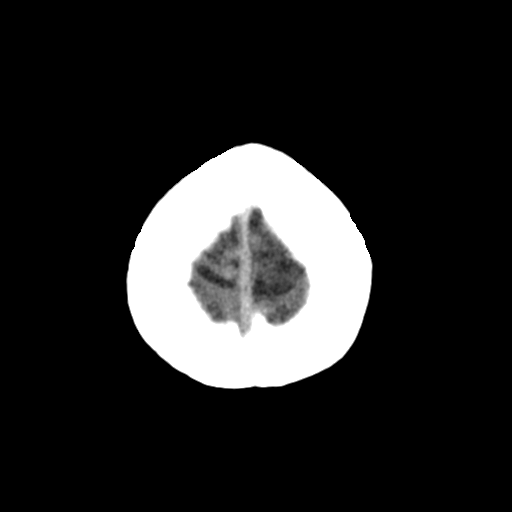
[im 28/31  brain]
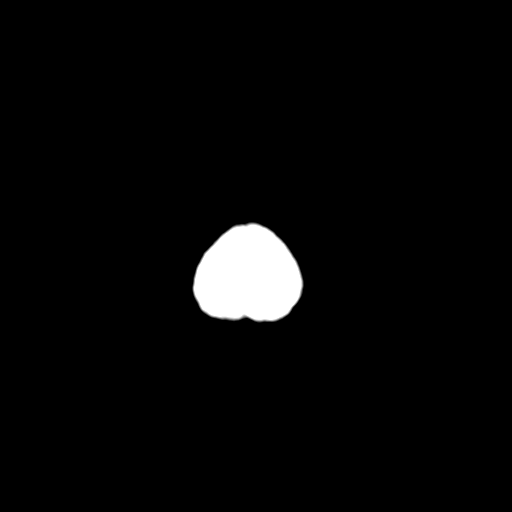
[im 28/31  bone]
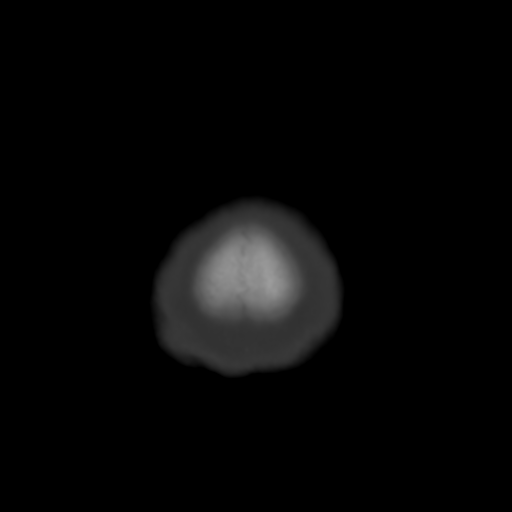

[Series 4: coronal soft · coronal · 0.31mm/px · 3 of 61 slices shown]
[im 21/61  brain]
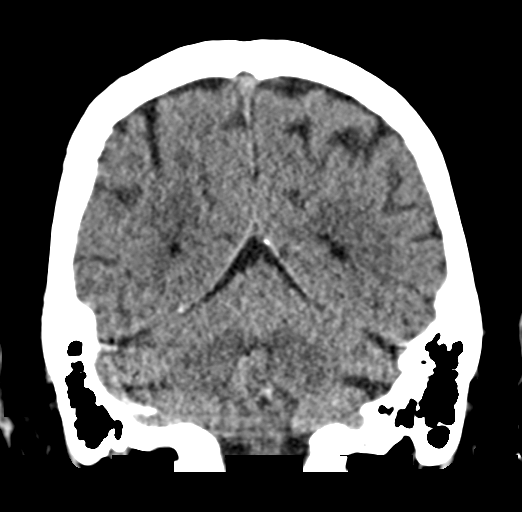
[im 27/61  brain]
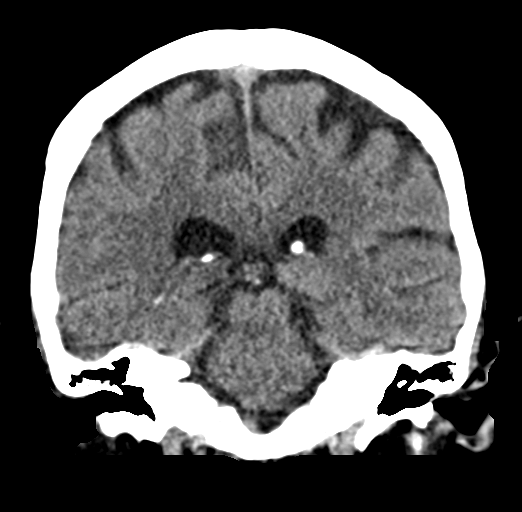
[im 34/61  brain]
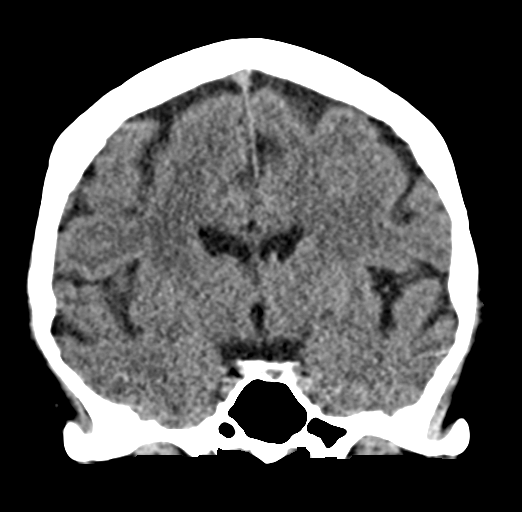

[Series 5: sag soft · sagittal · 0.31mm/px · 3 of 52 slices shown]
[im 18/52  brain]
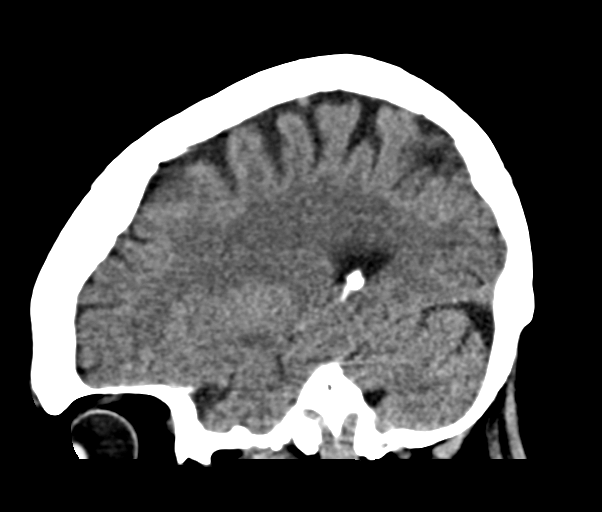
[im 26/52  brain]
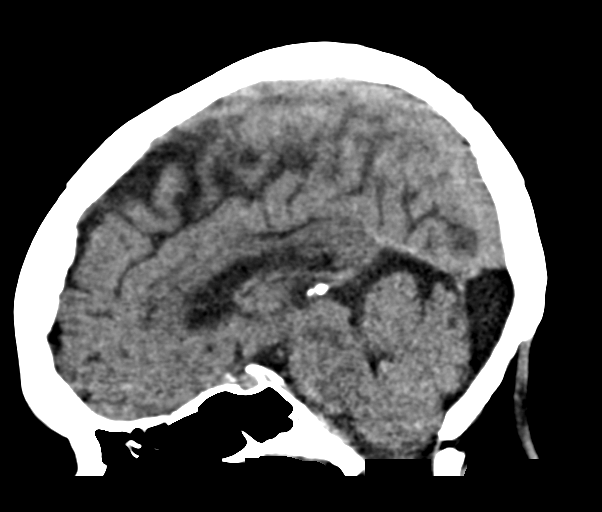
[im 35/52  brain]
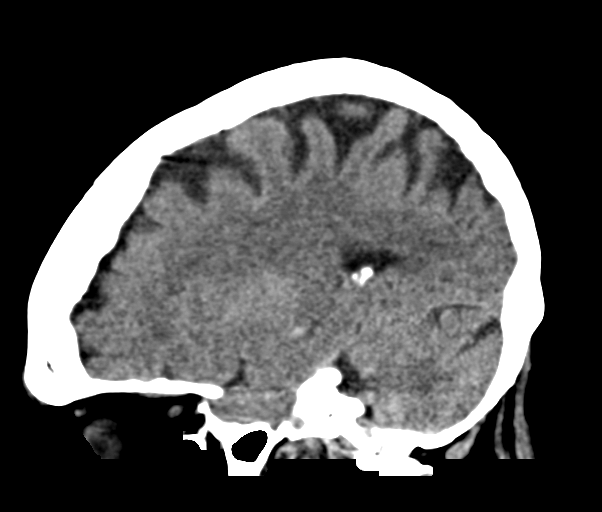

[15 of 47 positions shown; findings below may reference images not displayed]

FINDINGS: Brain:

Minimal generalized cerebral atrophy, not unexpected for age.

Retro-cerebellar CSF density prominence eccentric to the right
measuring 3.7 x 1.8 x 3.3 cm, likely reflecting an arachnoid cyst
(for instance as seen on series 2, image 11) (series 4, image 12).

There is no acute intracranial hemorrhage.

No demarcated cortical infarct.

No evidence of an intracranial mass.

No midline shift.

Vascular: No hyperdense vessel.  Atherosclerotic calcifications.

Skull: Normal. Negative for fracture or focal lesion.

Sinuses/Orbits: Visualized orbits show no acute finding. Mild
mucosal thickening within the bilateral ethmoid sinuses.
IMPRESSION: No evidence of acute intracranial abnormality.

Minimal generalized cerebral and cerebellar atrophy, not unexpected
for age.

Retro-cerebellar arachnoid cyst.

Mild bilateral ethmoid sinus mucosal thickening.

## 2023-09-16 ENCOUNTER — Other Ambulatory Visit: Payer: Self-pay | Admitting: Physician Assistant

## 2023-09-16 ENCOUNTER — Other Ambulatory Visit: Payer: Self-pay | Admitting: Psychiatry

## 2023-09-16 DIAGNOSIS — K117 Disturbances of salivary secretion: Secondary | ICD-10-CM

## 2023-09-16 NOTE — Telephone Encounter (Signed)
PT APPT. 11/5

## 2023-09-17 ENCOUNTER — Ambulatory Visit: Payer: Medicare Other | Admitting: Psychiatry

## 2023-09-17 ENCOUNTER — Other Ambulatory Visit: Payer: Self-pay | Admitting: Psychiatry

## 2023-09-17 ENCOUNTER — Encounter: Payer: Self-pay | Admitting: Psychiatry

## 2023-09-17 DIAGNOSIS — G2571 Drug induced akathisia: Secondary | ICD-10-CM | POA: Diagnosis not present

## 2023-09-17 DIAGNOSIS — F411 Generalized anxiety disorder: Secondary | ICD-10-CM | POA: Diagnosis not present

## 2023-09-17 DIAGNOSIS — G2401 Drug induced subacute dyskinesia: Secondary | ICD-10-CM | POA: Diagnosis not present

## 2023-09-17 DIAGNOSIS — K117 Disturbances of salivary secretion: Secondary | ICD-10-CM | POA: Diagnosis not present

## 2023-09-17 DIAGNOSIS — G3184 Mild cognitive impairment, so stated: Secondary | ICD-10-CM | POA: Diagnosis not present

## 2023-09-17 DIAGNOSIS — F3181 Bipolar II disorder: Secondary | ICD-10-CM | POA: Diagnosis not present

## 2023-09-17 MED ORDER — ATROPINE SULFATE 1 % OP SOLN: OPHTHALMIC | 2 refills | Status: DC

## 2023-09-17 MED ORDER — QUETIAPINE FUMARATE ER 300 MG PO TB24: 300.0000 mg | ORAL_TABLET | Freq: Every evening | ORAL | 1 refills | Status: DC

## 2023-09-17 NOTE — Progress Notes (Signed)
Megan Duncan [id number] [birth year] 77 y.o.  Subjective:   Patient ID:  Megan Duncan is a 77 y.o. (DOB 1946-04-17) female.  Chief Complaint:  Chief Complaint  Patient presents with   Follow-up   Depression   Anxiety   Medication Reaction    HPI [name] presents to the office today for follow-up of treatment resistant tardive dyskinesia, confusion, anxiety, sleep problems and mood problems.  07/17/23 appt: Seen with D and H.  CC akathisia Fell with 2 fx vertebra with surgery a couple of mos ago. Tremor is bad per H.  Shakes all over but worse at night.  Drooling bad for a long time and not better off it.   Wk 6 of Austedo 42mg  for day.  D says some benefit with it.   SE ingrezza N .  Tried twice.  Worsening hallucinations after trying to reduce haloperidol.   D's #1 concern is her restlessness.  Pt  notes it intermittently.   H says night is worse with awakening restless about 2 AM.  Worse in middle of night.    Restlessness for a long while.   Awake 30 min to an hour.  No NM now. Prazosin helped. Weakness makes it hard for her to move around at night.   I think I do re: dep and anxiety.  No voices. Denies fearfulness except when feels she can't move esp at night.   Notices confusion.  Family notices she loses thoughts and some difficulty putting words together but other times can put out a normal sentence.   Seeking home health care assistance DT can't leave her alone Plan: Atropine eye drops 1 drop each side of tongue 2-3 times daily as needed. Reduce quetiapine to 100 mg nightly . If restlessness is not better in about 4 days , then increase mirtazapine to 30 mg nightly.  09/17/23 appt noted: Psych med:  Austedo XR 42, donepezil 5 mg at bedtime, duloxetine 80 mg daily, Haloperidol 1 every morning and 2 nightly, lorazepam 1 mg PM and 1 other prn restlessness or agitation, mirtazapine 15, terazosin 2 mg nightly, quetiapine 200 mg tablet one half  nightly. Reduced quetiapine 100 mg HS for 10 days and H wanted to increase it again bc poor sleep.   Restlessness got worse when reduced quetiapine.   Atropine eye drops are helping the drooling Saw Dr. Laury Axon in the last couple of weeks. Order for palliative care and feels good abotu it.   H thinks she is still restless  and "agitated with legs"  D notes marked change in last month with reduced appetite, interest, losing wt.     Psych med: Austedo XR 42, clonazepam 0.5 stopped DT sedation , lorazepam 1 mg HS and 0.5 mg twice daily prn,  haloperidol 1 mg AM and 2 mg HS, mirtazapine 15 HS, prazosin 2 mg HS for NM and BP, stopped propranolol 10 at same time as clonazepam, duloxetine 80,  Quetiapine 200 mg HS.  Past medications for mental health diagnoses include: Trileptal, Lamictal, lithium, Depakote,  Prozac Zoloft, Effexor XR, Cymbalta, Paxil,  Latuda, Risperdal, Thorazine  Haldol and Tegretol,  Zyprexa given for a few days inpatient January 2023, Wellbutrin, propranolol for tremor,  propranolol for lithium tremor,  Aricept caused nausea , Ingrezza caused nausea, Austedo didn't help a lot   Hospitalized in January 2023 at [facility] for altered mental status with hallucinations, paranoia, and aggressive behavior   AUDIT    Flowsheet Row Admission (  Discharged) from 12/03/2021 in [facility] BEHAVIORAL MEDICINE  Alcohol Use Disorder Identification Test Final Score (AUDIT) 1      Mini-Mental    Flowsheet Row Office Visit from 08/17/2021 in [facility] Clinical Support from 01/10/2017 in [facility] Primary Care at [facility]  Total Score (max 30 points ) 26 29      PHQ2-9    Flowsheet Row Clinical Support from 12/28/2022 in [facility] at [facility] Office Visit from 08/07/2022 in [facility] Care at [facility] Chronic Care Management from 11/27/2021 in  [facility] Primary Care at [facility] Office Visit from 08/17/2021 in [facility] Clinical Support from 08/15/2021 in [facility] Primary Care at [facility]  PHQ-2 Total Score 6 0 0 0 0  PHQ-9 Total Score 16 -- -- -- --      Flowsheet Row ED from 05/21/2023 in [facility] Emergency Department at [facility] ED from 08/02/2022 in [facility] Emergency Department at [facility] ED from 08/01/2022 in [facility] Emergency Department at [facility]  C-SSRS RISK CATEGORY No Risk No Risk No Risk        Review of Systems:  Review of Systems  Neurological:  Positive for tremors and weakness.  Psychiatric/Behavioral:  Positive for agitation, behavioral problems, confusion, decreased concentration and sleep disturbance. The patient is nervous/anxious.     Medications: I have reviewed the patient's current medications.  Current Outpatient Medications  Medication Sig Dispense Refill   acetaminophen (TYLENOL) 650 MG CR tablet Take 650 mg by mouth every 8 (eight) hours as needed for pain.     clotrimazole-betamethasone (LOTRISONE) cream Apply 1 Application topically daily. 30 g 0   Deutetrabenazine ER (AUSTEDO XR) 42 MG TB24 Take 42 mg by mouth daily. 30 tablet 1   donepezil (ARICEPT) 5 MG tablet Take 1 tablet (5 mg total) by mouth at bedtime. 90 tablet 3   DULoxetine (CYMBALTA) 60 MG capsule TAKE 1 CAPSULE BY MOUTH DAILY. TAKE WITH 20mg  FOR total OF 80mg  DAILY 90 capsule 1   haloperidol (HALDOL) 1 MG tablet TAKE 1 TABLET BY MOUTH EVERY MORNING, and TAKE 2 TABLETS NIGHTLY AT BEDTIME 90 tablet 0   HYDROcodone-acetaminophen (NORCO/VICODIN) 5-325 MG tablet Take 1 tablet by mouth every 6 (six) hours as needed for moderate pain. 30 tablet 0   hydrocortisone-pramoxine (ANALPRAM HC) 2.5-1 % rectal cream Place 1 application rectally 3 (three) times daily. 30 g 3   Lancets MISC Use as directed once a day.  Dx code: E11.9 30  each 2   levothyroxine (SYNTHROID) 50 MCG tablet TAKE 1 TABLET BY MOUTH EVERY DAY BEFORE breakfast 90 tablet 1   lidocaine (LIDODERM) 5 % Place 1 patch onto the skin daily as needed. Remove & Discard patch within 12 hours or as directed by MD 30 patch 0   LORazepam (ATIVAN) 1 MG tablet Take 1 tablet (1 mg total) by mouth 3 (three) times daily as needed for anxiety. 90 tablet 1   metFORMIN (GLUCOPHAGE) 1000 MG tablet TAKE 1 TABLET BY MOUTH EVERY DAY 90 tablet 1   mirtazapine (REMERON) 15 MG tablet TAKE 1 TABLET BY MOUTH AT BEDTIME 30 tablet 0   Naftifine HCl (NAFTIN) 2 % CREA Apply every day 60 g 3   omeprazole (PRILOSEC) 20 MG capsule Take 1 capsule (20 mg total) by mouth daily. 30 capsule 3  ondansetron (ZOFRAN) 4 MG tablet Take 1 tablet (4 mg total) by mouth every 8 (eight) hours as needed for nausea or vomiting. 20 tablet 2   prazosin (MINIPRESS) 2 MG capsule TAKE 1 CAPSULE BY MOUTH AT BEDTIME 90 capsule 0   QUEtiapine (SEROQUEL XR) 300 MG 24 hr tablet Take 1 tablet (300 mg total) by mouth every evening. 30 tablet 1   traMADol (ULTRAM) 50 MG tablet Take 50 mg by mouth every 6 (six) hours as needed.     atropine 1 % ophthalmic solution 1-2 drop on each side of tongue up to 3 times daily as needed for drooling 5 mL 2   No current facility-administered medications for this visit.    Medication Side Effects: Other: As as noted above  Allergies:  Allergies  Allergen Reactions   Atorvastatin Other (See Comments)    Significant rise in liver tests   Codeine Nausea And Vomiting   Diclofenac Other (See Comments)    Elevated LFT's   Doxycycline     Extreme nausea   Fluconazole Other (See Comments)    Hallucinations    Sulfonamide Derivatives Swelling    Past Medical History:  Diagnosis Date   Acute pain of right shoulder 09/24/2018   Acute upper respiratory infection 09/19/2010   Allergic rhinitis 10/03/2016   Anterolisthesis of lumbar spine    Arthritis    hands   Bilateral hip  pain 09/02/2014   Bipolar II disorder    Cellulitis and abscess of trunk 09/15/2009   Ceruminosis 03/04/2014   Cervicalgia 06/20/2007   Chronic low back pain 06/20/2007   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-sided sciatica 08/06/2012   COPD (chronic obstructive pulmonary disease) (HCC)    Deformity of finger 07/15/2014   Degeneration of lumbar intervertebral disc 08/22/2020   Degenerative lumbar spinal stenosis    Dermatitis 06/02/2015   Diastolic dysfunction    Per pt, diagnosed after [facility]   Diverticulosis    DOE (dyspnea on exertion) 07/20/2016   Essential (primary) hypertension 04/23/2007   Estrogen deficiency 09/01/2017   External hemorrhoid 08/01/2015   Fibromyalgia    Foraminal stenosis of lumbar region 08/22/2020   Generalized anxiety disorder 09/01/2018   Hyperlipidemia    Inflammatory disease of uterus 04/23/2007   Left flank pain 07/13/2021   Left upper quadrant pain 09/01/2017   Lower abdominal pain 08/06/2012   Lumbar back pain with radiculopathy affecting left lower extremity 01/24/2010   Lumbar radiculopathy 01/26/2021   Lump in upper inner quadrant of left breast 07/13/2021   Major depressive disorder    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   Otitis media 09/05/2010   Palpitations 07/20/2016   Peripheral neuropathy 02/22/2010   Retrolisthesis of vertebrae 08/22/2020   Shingles outbreak 04/14/2015   Sinusitis, acute maxillary 09/14/2013   Stress incontinence, female 08/01/2015   Thoracic aortic atherosclerosis 05/14/2017   Thyroid disease    Tinea corporis 09/01/2017   Tinnitus 03/04/2014   Torticollis 03/04/2014   Type II diabetes mellitus 06/05/2018    Past Medical History, Surgical history, Social history, and Family history were reviewed and updated as appropriate.   Please see review of systems for further details on the patient's review from today.   Objective:   Physical Exam:  There were no vitals taken for  this visit.  Physical Exam Constitutional:      General: She is not in acute distress.    Appearance: She is well-developed. She is ill-appearing.  Musculoskeletal:  General: No deformity.  Neurological:     Mental Status: She is alert and oriented to person, place, and time.     Motor: Weakness and tremor present.     Coordination: Coordination abnormal.     Comments: Stooped posture  Psychiatric:        Attention and Perception: She is inattentive. She does not perceive auditory hallucinations.        Mood and Affect: Mood is anxious. Mood is not depressed. Affect is not labile, blunt, angry or inappropriate.        Speech: Speech normal. Speech is not rapid and pressured or slurred.        Behavior: Behavior normal.        Thought Content: Thought content normal. Thought content is not delusional. Thought content does not include homicidal or suicidal ideation. Thought content does not include suicidal plan.        Cognition and Memory: Cognition is impaired. She exhibits impaired recent memory.        Judgment: Judgment normal.     Comments: Fair historian but not generally contributing to conversation unless specifically asked. Reduced speech.       Lab Review:     Component Value Date/Time   NA 143 08/20/2023 0956   K 3.9 08/20/2023 0956   CL 105 08/20/2023 0956   CO2 28 08/20/2023 0956   GLUCOSE 91 08/20/2023 0956   BUN 14 08/20/2023 0956   CREATININE 1.05 08/20/2023 0956   CREATININE 0.76 08/30/2017 1541   CALCIUM 9.6 08/20/2023 0956   PROT 6.2 08/20/2023 0956   ALBUMIN 4.0 08/20/2023 0956   AST 17 08/20/2023 0956   ALT 13 08/20/2023 0956   ALKPHOS 57 08/20/2023 0956   BILITOT 0.6 08/20/2023 0956   GFRNONAA 54 (L) 08/01/2022 1448   GFRAA 93 11/24/2008 1041       Component Value Date/Time   WBC 5.7 08/20/2023 0956   RBC 4.11 08/20/2023 0956   HGB 13.0 08/20/2023 0956   HCT 39.9 08/20/2023 0956   PLT 277.0 08/20/2023 0956   MCV 96.9 08/20/2023 0956    MCH 31.0 08/01/2022 1448   MCHC 32.5 08/20/2023 0956   RDW 14.7 08/20/2023 0956   LYMPHSABS 1.4 08/20/2023 0956   MONOABS 0.5 08/20/2023 0956   EOSABS 0.0 08/20/2023 0956   BASOSABS 0.1 08/20/2023 0956    Lithium Lvl  Date Value Ref Range Status  12/02/2021 1.39 (H) 0.60 - 1.20 mmol/L Final    Comment:    Performed at [facility], [address] [place], [place] [address]     No results found for: "PHENYTOIN", "PHENOBARB", "VALPROATE", "CBMZ"   .res Assessment: Plan:    Joslin "Karen Kitchens" was seen today for follow-up, depression, anxiety and medication reaction.  Diagnoses and all orders for this visit:  Bipolar II disorder (HCC) -     QUEtiapine (SEROQUEL XR) 300 MG 24 hr tablet; Take 1 tablet (300 mg total) by mouth every evening.  Drooling -     atropine 1 % ophthalmic solution; 1-2 drop on each side of tongue up to 3 times daily as needed for drooling  Tardive dyskinesia  Akathisia  Generalized anxiety disorder  Mild cognitive impairment     45 min urgent appt:  seen referred by Melony Overly PA DT TR of various problems including SE with meds, restlessness, intermittent confusion, depression and anxiety. Family she is having a general decline in cognitive defect care and has been called in to assist.  It appears that she may have some sort of neurodegenerative disease and she is also causing marked weight loss however this disease has not been clearly defined.  We will continued to work to make sure depression and pseudodementia are not a contribution to the problem.  Seen with H and D who care for her.  The family's most immediate concern is the daytime and evening restlessness without known reasons.  It is quite distressing in the middle of the night but occurred during the day as well.  It did not respond to propranolol although  Klonopin was also being used at the same time is not clear that she was just having side effects of Klonopin rather than  side effects of propranolol.  This could be retried carefully. She got worse instead of better with reduction of quetiapine from 200 mg daily to the 100 mg at night.  This suggests that it is probably not akathisia.  But more agitation therefore logical next step would be to increase quetiapine perhaps convert to extended release in order to give better calming effect throughout the day. Therefore increase quetiapine to ER 300 mg daily p.m.  History of relapse of psychosis when haloperidol was reduced.  So though this may contributing to akathisia will not attempt to lower this dosage right now.  Recent basic labs 08/2023 normal.    Tardive dyskinesia we will proceed with a trial of Austedo XR 42 mg daily.  She just got to this dosage but has seen no improvement so far.  No change in the duloxetine dose right now.  Since we are pursuing other med changes.  Lorazepam is necessary for now but we discussed his cognitive and other side effect risk. We discussed the short-term risks associated with benzodiazepines including sedation and increased fall risk among others.  Discussed long-term side effect risk including dependence, potential withdrawal symptoms, and the potential eventual dose-related risk of dementia.  But recent studies from 2020 dispute this association between benzodiazepines and dementia risk. Newer studies in 2020 do not support an association with dementia.  Mirtazapine is being used for akathisia without benefit at 15 mg.  If the following med changes do not help we may consider increasing this dosage   Atropine was helpful with drooling but they do not have the dose and frequency quite worked out.  We discussed going up in the dosage. Some benefit Atropine eye drops 1-2 drop each side of tongue 2-3 times daily as needed. Change quetiapine to ER 300 mg PM bc was more restless , agitated with lower dose and using ER may help with calming daytime and dep  Reduce duloxetine to 60  mg daily DT polypharmachy  Agree with resuming donepezil per PCP  Follow-up as soon as possible.  Meredith Staggers MD, DFAPA Please see After Visit Summary for patient specific instructions.  Future Appointments  Date Time Provider Department Center  09/27/2023  1:00 PM [facility] SPEECH THERAPIST [facility] [place]  09/27/2023  1:00 PM [facility] R/F 2 [facility] [place]  11/19/2023  4:00 PM Cottle, Steva Ready., MD [facility] None  12/19/2023  2:30 PM Cottle, Steva Ready., MD [facility] None    No orders of the defined types were placed in this encounter.   -------------------------------

## 2023-09-18 DIAGNOSIS — M47816 Spondylosis without myelopathy or radiculopathy, lumbar region: Secondary | ICD-10-CM | POA: Diagnosis not present

## 2023-09-19 ENCOUNTER — Other Ambulatory Visit: Payer: Self-pay | Admitting: Psychiatry

## 2023-09-19 NOTE — Telephone Encounter (Signed)
Has appt today

## 2023-09-19 NOTE — Telephone Encounter (Signed)
Pt had apt on 11/05

## 2023-09-20 ENCOUNTER — Telehealth: Payer: Self-pay | Admitting: Family Medicine

## 2023-09-20 NOTE — Telephone Encounter (Signed)
Verbal given 

## 2023-09-20 NOTE — Telephone Encounter (Signed)
Geraldine Contras, Nurse with [facility], called to request an order for the patient to be assessed for Hospice. Please call and advise at [phone].

## 2023-09-27 ENCOUNTER — Ambulatory Visit (HOSPITAL_COMMUNITY)
Admission: RE | Admit: 2023-09-27 | Discharge: 2023-09-27 | Disposition: A | Discharge status: Home / Self Care | Payer: Medicare Other | Source: Ambulatory Visit | Attending: *Deleted | Admitting: *Deleted

## 2023-09-27 ENCOUNTER — Ambulatory Visit (HOSPITAL_COMMUNITY)
Admission: RE | Admit: 2023-09-27 | Discharge: 2023-09-27 | Disposition: A | Discharge status: Home / Self Care | Payer: Medicare Other | Source: Ambulatory Visit | Attending: Family Medicine | Admitting: Family Medicine

## 2023-09-27 DIAGNOSIS — R1310 Dysphagia, unspecified: Secondary | ICD-10-CM

## 2023-09-27 DIAGNOSIS — M436 Torticollis: Secondary | ICD-10-CM | POA: Diagnosis not present

## 2023-09-27 DIAGNOSIS — R1311 Dysphagia, oral phase: Secondary | ICD-10-CM | POA: Insufficient documentation

## 2023-09-27 DIAGNOSIS — R0609 Other forms of dyspnea: Secondary | ICD-10-CM | POA: Diagnosis not present

## 2023-09-27 DIAGNOSIS — R059 Cough, unspecified: Secondary | ICD-10-CM | POA: Diagnosis not present

## 2023-09-27 DIAGNOSIS — J449 Chronic obstructive pulmonary disease, unspecified: Secondary | ICD-10-CM | POA: Insufficient documentation

## 2023-09-27 DIAGNOSIS — G3184 Mild cognitive impairment, so stated: Secondary | ICD-10-CM | POA: Diagnosis not present

## 2023-09-27 DIAGNOSIS — E119 Type 2 diabetes mellitus without complications: Secondary | ICD-10-CM | POA: Insufficient documentation

## 2023-09-27 DIAGNOSIS — K219 Gastro-esophageal reflux disease without esophagitis: Secondary | ICD-10-CM | POA: Diagnosis not present

## 2023-09-27 DIAGNOSIS — F411 Generalized anxiety disorder: Secondary | ICD-10-CM | POA: Diagnosis not present

## 2023-09-27 DIAGNOSIS — R110 Nausea: Secondary | ICD-10-CM

## 2023-09-27 DIAGNOSIS — E079 Disorder of thyroid, unspecified: Secondary | ICD-10-CM | POA: Diagnosis not present

## 2023-09-27 DIAGNOSIS — G2401 Drug induced subacute dyskinesia: Secondary | ICD-10-CM | POA: Insufficient documentation

## 2023-09-27 DIAGNOSIS — R634 Abnormal weight loss: Secondary | ICD-10-CM | POA: Diagnosis present

## 2023-09-27 NOTE — Evaluation (Signed)
Modified Barium Swallow Study  Patient Details  Name: Megan Duncan MRN: [id number] Date of Birth: [birth year]  Today's Date: 09/27/2023  Modified Barium Swallow completed.  Full report located under Chart Review in the Imaging Section.  History of Present Illness Pt is a 77 yo presenting for OP MBS due to reports of difficulty swallowing, resulting in decreased PO intake and weight loss. Pt and husband report that she feels like pills and solids won't go down. PMH includes: mild neurocognitive disorder, tardive dyskinesia, COPD, GAD, DMII, DOE, torticollis, thyroid disease   Clinical Impression Pt has a mild oral dysphagia, with pooling and anterior loss of saliva at baseline. She has repetitive lingual movements especially before the swallow, but is pretty efficient at clearing her oral cavity once she initiates a more prominent posterior propulsion. She did have some mild residue with solids and with the barium tablet, which required a bolus of puree to clear from her tongue. Pharyngeally, her swallow was [facility]. Recommend continuing with regular solids and thin liquids as tolerated, but educated pt and spouse about some strategies that could potentially facilitate swallowing. This included offering pills whole with puree or using purees and/or sauces to make solids a little more moist. Could consider a more dedicated assessment of the esophagus if symptoms such as globus sensation persist.  Factors that may increase risk of adverse event in presence of aspiration Rubye Oaks & Clearance Coots 2021): Reduced cognitive function;Weak cough  Swallow Evaluation Recommendations Recommendations: PO diet PO Diet Recommendation: Regular;Thin liquids (Level 0) (can consider softening/chopping foods PRN, or mixing solid foods with purees/sauces) Liquid Administration via: Cup;Straw Medication Administration: Whole meds with liquid (can try whole with puree PRN) Supervision: Patient able to self-feed Swallowing  strategies  : Minimize environmental distractions;Slow rate;Small bites/sips;Follow solids with liquids Postural changes: Position pt fully upright for meals Oral care recommendations: Oral care BID (2x/day)      Mahala Menghini., M.A. CCC-SLP Acute Rehabilitation Services Office [phone]  Secure chat preferred  09/27/2023,2:41 PM

## 2023-10-01 ENCOUNTER — Telehealth: Payer: Self-pay | Admitting: Psychiatry

## 2023-10-01 NOTE — Telephone Encounter (Signed)
Daughter called and said that Megan Duncan will qualify for hospice. [name] wants to know how does insurance cover the austedo with the grant. Hospice wants to know what to do. Please call julia at [phone]

## 2023-10-02 DIAGNOSIS — M47816 Spondylosis without myelopathy or radiculopathy, lumbar region: Secondary | ICD-10-CM | POA: Diagnosis not present

## 2023-10-02 NOTE — Telephone Encounter (Signed)
Told Megan Duncan that she would need to call [place] and ask them about this.

## 2023-10-04 DIAGNOSIS — J449 Chronic obstructive pulmonary disease, unspecified: Secondary | ICD-10-CM | POA: Diagnosis not present

## 2023-10-04 DIAGNOSIS — I10 Essential (primary) hypertension: Secondary | ICD-10-CM | POA: Diagnosis not present

## 2023-10-04 DIAGNOSIS — F3181 Bipolar II disorder: Secondary | ICD-10-CM | POA: Diagnosis not present

## 2023-10-04 DIAGNOSIS — G2401 Drug induced subacute dyskinesia: Secondary | ICD-10-CM | POA: Diagnosis not present

## 2023-10-04 DIAGNOSIS — R251 Tremor, unspecified: Secondary | ICD-10-CM | POA: Diagnosis not present

## 2023-10-04 DIAGNOSIS — R634 Abnormal weight loss: Secondary | ICD-10-CM | POA: Diagnosis not present

## 2023-10-04 DIAGNOSIS — M1380 Other specified arthritis, unspecified site: Secondary | ICD-10-CM | POA: Diagnosis not present

## 2023-10-04 DIAGNOSIS — E1165 Type 2 diabetes mellitus with hyperglycemia: Secondary | ICD-10-CM | POA: Diagnosis not present

## 2023-10-04 DIAGNOSIS — E039 Hypothyroidism, unspecified: Secondary | ICD-10-CM | POA: Diagnosis not present

## 2023-10-04 DIAGNOSIS — E785 Hyperlipidemia, unspecified: Secondary | ICD-10-CM | POA: Diagnosis not present

## 2023-10-04 DIAGNOSIS — R419 Unspecified symptoms and signs involving cognitive functions and awareness: Secondary | ICD-10-CM | POA: Diagnosis not present

## 2023-10-04 DIAGNOSIS — J309 Allergic rhinitis, unspecified: Secondary | ICD-10-CM | POA: Diagnosis not present

## 2023-10-07 ENCOUNTER — Other Ambulatory Visit: Payer: Self-pay | Admitting: Psychiatry

## 2023-10-07 DIAGNOSIS — G2401 Drug induced subacute dyskinesia: Secondary | ICD-10-CM | POA: Diagnosis not present

## 2023-10-07 DIAGNOSIS — F3181 Bipolar II disorder: Secondary | ICD-10-CM | POA: Diagnosis not present

## 2023-10-07 DIAGNOSIS — J309 Allergic rhinitis, unspecified: Secondary | ICD-10-CM | POA: Diagnosis not present

## 2023-10-07 DIAGNOSIS — E1165 Type 2 diabetes mellitus with hyperglycemia: Secondary | ICD-10-CM | POA: Diagnosis not present

## 2023-10-07 DIAGNOSIS — R634 Abnormal weight loss: Secondary | ICD-10-CM | POA: Diagnosis not present

## 2023-10-07 DIAGNOSIS — K117 Disturbances of salivary secretion: Secondary | ICD-10-CM

## 2023-10-07 DIAGNOSIS — J449 Chronic obstructive pulmonary disease, unspecified: Secondary | ICD-10-CM | POA: Diagnosis not present

## 2023-10-08 DIAGNOSIS — J309 Allergic rhinitis, unspecified: Secondary | ICD-10-CM | POA: Diagnosis not present

## 2023-10-08 DIAGNOSIS — F3181 Bipolar II disorder: Secondary | ICD-10-CM | POA: Diagnosis not present

## 2023-10-08 DIAGNOSIS — J449 Chronic obstructive pulmonary disease, unspecified: Secondary | ICD-10-CM | POA: Diagnosis not present

## 2023-10-08 DIAGNOSIS — R634 Abnormal weight loss: Secondary | ICD-10-CM | POA: Diagnosis not present

## 2023-10-08 DIAGNOSIS — E1165 Type 2 diabetes mellitus with hyperglycemia: Secondary | ICD-10-CM | POA: Diagnosis not present

## 2023-10-08 DIAGNOSIS — G2401 Drug induced subacute dyskinesia: Secondary | ICD-10-CM | POA: Diagnosis not present

## 2023-10-12 DIAGNOSIS — R634 Abnormal weight loss: Secondary | ICD-10-CM | POA: Diagnosis not present

## 2023-10-12 DIAGNOSIS — E1165 Type 2 diabetes mellitus with hyperglycemia: Secondary | ICD-10-CM | POA: Diagnosis not present

## 2023-10-12 DIAGNOSIS — J309 Allergic rhinitis, unspecified: Secondary | ICD-10-CM | POA: Diagnosis not present

## 2023-10-12 DIAGNOSIS — G2401 Drug induced subacute dyskinesia: Secondary | ICD-10-CM | POA: Diagnosis not present

## 2023-10-12 DIAGNOSIS — J449 Chronic obstructive pulmonary disease, unspecified: Secondary | ICD-10-CM | POA: Diagnosis not present

## 2023-10-12 DIAGNOSIS — F3181 Bipolar II disorder: Secondary | ICD-10-CM | POA: Diagnosis not present

## 2023-10-13 DIAGNOSIS — M1380 Other specified arthritis, unspecified site: Secondary | ICD-10-CM | POA: Diagnosis not present

## 2023-10-13 DIAGNOSIS — I10 Essential (primary) hypertension: Secondary | ICD-10-CM | POA: Diagnosis not present

## 2023-10-13 DIAGNOSIS — E1165 Type 2 diabetes mellitus with hyperglycemia: Secondary | ICD-10-CM | POA: Diagnosis not present

## 2023-10-13 DIAGNOSIS — J449 Chronic obstructive pulmonary disease, unspecified: Secondary | ICD-10-CM | POA: Diagnosis not present

## 2023-10-13 DIAGNOSIS — R251 Tremor, unspecified: Secondary | ICD-10-CM | POA: Diagnosis not present

## 2023-10-13 DIAGNOSIS — R419 Unspecified symptoms and signs involving cognitive functions and awareness: Secondary | ICD-10-CM | POA: Diagnosis not present

## 2023-10-13 DIAGNOSIS — R634 Abnormal weight loss: Secondary | ICD-10-CM | POA: Diagnosis not present

## 2023-10-13 DIAGNOSIS — F3181 Bipolar II disorder: Secondary | ICD-10-CM | POA: Diagnosis not present

## 2023-10-13 DIAGNOSIS — E785 Hyperlipidemia, unspecified: Secondary | ICD-10-CM | POA: Diagnosis not present

## 2023-10-13 DIAGNOSIS — G2401 Drug induced subacute dyskinesia: Secondary | ICD-10-CM | POA: Diagnosis not present

## 2023-10-13 DIAGNOSIS — J309 Allergic rhinitis, unspecified: Secondary | ICD-10-CM | POA: Diagnosis not present

## 2023-10-13 DIAGNOSIS — E039 Hypothyroidism, unspecified: Secondary | ICD-10-CM | POA: Diagnosis not present

## 2023-10-14 ENCOUNTER — Other Ambulatory Visit: Payer: Self-pay | Admitting: Psychiatry

## 2023-10-14 DIAGNOSIS — F3181 Bipolar II disorder: Secondary | ICD-10-CM

## 2023-10-15 DIAGNOSIS — R634 Abnormal weight loss: Secondary | ICD-10-CM | POA: Diagnosis not present

## 2023-10-15 DIAGNOSIS — J449 Chronic obstructive pulmonary disease, unspecified: Secondary | ICD-10-CM | POA: Diagnosis not present

## 2023-10-15 DIAGNOSIS — F3181 Bipolar II disorder: Secondary | ICD-10-CM | POA: Diagnosis not present

## 2023-10-15 DIAGNOSIS — E1165 Type 2 diabetes mellitus with hyperglycemia: Secondary | ICD-10-CM | POA: Diagnosis not present

## 2023-10-15 DIAGNOSIS — J309 Allergic rhinitis, unspecified: Secondary | ICD-10-CM | POA: Diagnosis not present

## 2023-10-15 DIAGNOSIS — G2401 Drug induced subacute dyskinesia: Secondary | ICD-10-CM | POA: Diagnosis not present

## 2023-10-16 ENCOUNTER — Other Ambulatory Visit: Payer: Self-pay | Admitting: Psychiatry

## 2023-10-16 DIAGNOSIS — G2401 Drug induced subacute dyskinesia: Secondary | ICD-10-CM | POA: Diagnosis not present

## 2023-10-16 DIAGNOSIS — J309 Allergic rhinitis, unspecified: Secondary | ICD-10-CM | POA: Diagnosis not present

## 2023-10-16 DIAGNOSIS — R634 Abnormal weight loss: Secondary | ICD-10-CM | POA: Diagnosis not present

## 2023-10-16 DIAGNOSIS — F3181 Bipolar II disorder: Secondary | ICD-10-CM | POA: Diagnosis not present

## 2023-10-16 DIAGNOSIS — J449 Chronic obstructive pulmonary disease, unspecified: Secondary | ICD-10-CM | POA: Diagnosis not present

## 2023-10-16 DIAGNOSIS — E1165 Type 2 diabetes mellitus with hyperglycemia: Secondary | ICD-10-CM | POA: Diagnosis not present

## 2023-10-17 DIAGNOSIS — E1165 Type 2 diabetes mellitus with hyperglycemia: Secondary | ICD-10-CM | POA: Diagnosis not present

## 2023-10-17 DIAGNOSIS — R634 Abnormal weight loss: Secondary | ICD-10-CM | POA: Diagnosis not present

## 2023-10-17 DIAGNOSIS — F3181 Bipolar II disorder: Secondary | ICD-10-CM | POA: Diagnosis not present

## 2023-10-17 DIAGNOSIS — G2401 Drug induced subacute dyskinesia: Secondary | ICD-10-CM | POA: Diagnosis not present

## 2023-10-17 DIAGNOSIS — J309 Allergic rhinitis, unspecified: Secondary | ICD-10-CM | POA: Diagnosis not present

## 2023-10-17 DIAGNOSIS — J449 Chronic obstructive pulmonary disease, unspecified: Secondary | ICD-10-CM | POA: Diagnosis not present

## 2023-10-21 DIAGNOSIS — J309 Allergic rhinitis, unspecified: Secondary | ICD-10-CM | POA: Diagnosis not present

## 2023-10-21 DIAGNOSIS — E1165 Type 2 diabetes mellitus with hyperglycemia: Secondary | ICD-10-CM | POA: Diagnosis not present

## 2023-10-21 DIAGNOSIS — G2401 Drug induced subacute dyskinesia: Secondary | ICD-10-CM | POA: Diagnosis not present

## 2023-10-21 DIAGNOSIS — F3181 Bipolar II disorder: Secondary | ICD-10-CM | POA: Diagnosis not present

## 2023-10-21 DIAGNOSIS — R634 Abnormal weight loss: Secondary | ICD-10-CM | POA: Diagnosis not present

## 2023-10-21 DIAGNOSIS — J449 Chronic obstructive pulmonary disease, unspecified: Secondary | ICD-10-CM | POA: Diagnosis not present

## 2023-10-22 DIAGNOSIS — G2401 Drug induced subacute dyskinesia: Secondary | ICD-10-CM | POA: Diagnosis not present

## 2023-10-22 DIAGNOSIS — F3181 Bipolar II disorder: Secondary | ICD-10-CM | POA: Diagnosis not present

## 2023-10-22 DIAGNOSIS — E1165 Type 2 diabetes mellitus with hyperglycemia: Secondary | ICD-10-CM | POA: Diagnosis not present

## 2023-10-22 DIAGNOSIS — R634 Abnormal weight loss: Secondary | ICD-10-CM | POA: Diagnosis not present

## 2023-10-22 DIAGNOSIS — J309 Allergic rhinitis, unspecified: Secondary | ICD-10-CM | POA: Diagnosis not present

## 2023-10-22 DIAGNOSIS — J449 Chronic obstructive pulmonary disease, unspecified: Secondary | ICD-10-CM | POA: Diagnosis not present

## 2023-10-23 DIAGNOSIS — J449 Chronic obstructive pulmonary disease, unspecified: Secondary | ICD-10-CM | POA: Diagnosis not present

## 2023-10-23 DIAGNOSIS — E1165 Type 2 diabetes mellitus with hyperglycemia: Secondary | ICD-10-CM | POA: Diagnosis not present

## 2023-10-23 DIAGNOSIS — J309 Allergic rhinitis, unspecified: Secondary | ICD-10-CM | POA: Diagnosis not present

## 2023-10-23 DIAGNOSIS — G2401 Drug induced subacute dyskinesia: Secondary | ICD-10-CM | POA: Diagnosis not present

## 2023-10-23 DIAGNOSIS — F3181 Bipolar II disorder: Secondary | ICD-10-CM | POA: Diagnosis not present

## 2023-10-23 DIAGNOSIS — R634 Abnormal weight loss: Secondary | ICD-10-CM | POA: Diagnosis not present

## 2023-10-25 DIAGNOSIS — G2401 Drug induced subacute dyskinesia: Secondary | ICD-10-CM | POA: Diagnosis not present

## 2023-10-25 DIAGNOSIS — J449 Chronic obstructive pulmonary disease, unspecified: Secondary | ICD-10-CM | POA: Diagnosis not present

## 2023-10-25 DIAGNOSIS — R634 Abnormal weight loss: Secondary | ICD-10-CM | POA: Diagnosis not present

## 2023-10-25 DIAGNOSIS — J309 Allergic rhinitis, unspecified: Secondary | ICD-10-CM | POA: Diagnosis not present

## 2023-10-25 DIAGNOSIS — F3181 Bipolar II disorder: Secondary | ICD-10-CM | POA: Diagnosis not present

## 2023-10-25 DIAGNOSIS — E1165 Type 2 diabetes mellitus with hyperglycemia: Secondary | ICD-10-CM | POA: Diagnosis not present

## 2023-10-29 DIAGNOSIS — R634 Abnormal weight loss: Secondary | ICD-10-CM | POA: Diagnosis not present

## 2023-10-29 DIAGNOSIS — J449 Chronic obstructive pulmonary disease, unspecified: Secondary | ICD-10-CM | POA: Diagnosis not present

## 2023-10-29 DIAGNOSIS — G2401 Drug induced subacute dyskinesia: Secondary | ICD-10-CM | POA: Diagnosis not present

## 2023-10-29 DIAGNOSIS — E1165 Type 2 diabetes mellitus with hyperglycemia: Secondary | ICD-10-CM | POA: Diagnosis not present

## 2023-10-29 DIAGNOSIS — F3181 Bipolar II disorder: Secondary | ICD-10-CM | POA: Diagnosis not present

## 2023-10-29 DIAGNOSIS — J309 Allergic rhinitis, unspecified: Secondary | ICD-10-CM | POA: Diagnosis not present

## 2023-10-31 DIAGNOSIS — J449 Chronic obstructive pulmonary disease, unspecified: Secondary | ICD-10-CM | POA: Diagnosis not present

## 2023-10-31 DIAGNOSIS — G2401 Drug induced subacute dyskinesia: Secondary | ICD-10-CM | POA: Diagnosis not present

## 2023-10-31 DIAGNOSIS — E1165 Type 2 diabetes mellitus with hyperglycemia: Secondary | ICD-10-CM | POA: Diagnosis not present

## 2023-10-31 DIAGNOSIS — R634 Abnormal weight loss: Secondary | ICD-10-CM | POA: Diagnosis not present

## 2023-10-31 DIAGNOSIS — F3181 Bipolar II disorder: Secondary | ICD-10-CM | POA: Diagnosis not present

## 2023-10-31 DIAGNOSIS — J309 Allergic rhinitis, unspecified: Secondary | ICD-10-CM | POA: Diagnosis not present

## 2023-11-01 DIAGNOSIS — R634 Abnormal weight loss: Secondary | ICD-10-CM | POA: Diagnosis not present

## 2023-11-01 DIAGNOSIS — J449 Chronic obstructive pulmonary disease, unspecified: Secondary | ICD-10-CM | POA: Diagnosis not present

## 2023-11-01 DIAGNOSIS — G2401 Drug induced subacute dyskinesia: Secondary | ICD-10-CM | POA: Diagnosis not present

## 2023-11-01 DIAGNOSIS — E1165 Type 2 diabetes mellitus with hyperglycemia: Secondary | ICD-10-CM | POA: Diagnosis not present

## 2023-11-01 DIAGNOSIS — F3181 Bipolar II disorder: Secondary | ICD-10-CM | POA: Diagnosis not present

## 2023-11-01 DIAGNOSIS — J309 Allergic rhinitis, unspecified: Secondary | ICD-10-CM | POA: Diagnosis not present

## 2023-11-05 DIAGNOSIS — R634 Abnormal weight loss: Secondary | ICD-10-CM | POA: Diagnosis not present

## 2023-11-05 DIAGNOSIS — J309 Allergic rhinitis, unspecified: Secondary | ICD-10-CM | POA: Diagnosis not present

## 2023-11-05 DIAGNOSIS — G2401 Drug induced subacute dyskinesia: Secondary | ICD-10-CM | POA: Diagnosis not present

## 2023-11-05 DIAGNOSIS — J449 Chronic obstructive pulmonary disease, unspecified: Secondary | ICD-10-CM | POA: Diagnosis not present

## 2023-11-05 DIAGNOSIS — F3181 Bipolar II disorder: Secondary | ICD-10-CM | POA: Diagnosis not present

## 2023-11-05 DIAGNOSIS — E1165 Type 2 diabetes mellitus with hyperglycemia: Secondary | ICD-10-CM | POA: Diagnosis not present

## 2023-11-07 DIAGNOSIS — R634 Abnormal weight loss: Secondary | ICD-10-CM | POA: Diagnosis not present

## 2023-11-07 DIAGNOSIS — J309 Allergic rhinitis, unspecified: Secondary | ICD-10-CM | POA: Diagnosis not present

## 2023-11-07 DIAGNOSIS — F3181 Bipolar II disorder: Secondary | ICD-10-CM | POA: Diagnosis not present

## 2023-11-07 DIAGNOSIS — E1165 Type 2 diabetes mellitus with hyperglycemia: Secondary | ICD-10-CM | POA: Diagnosis not present

## 2023-11-07 DIAGNOSIS — J449 Chronic obstructive pulmonary disease, unspecified: Secondary | ICD-10-CM | POA: Diagnosis not present

## 2023-11-07 DIAGNOSIS — G2401 Drug induced subacute dyskinesia: Secondary | ICD-10-CM | POA: Diagnosis not present

## 2023-11-08 DIAGNOSIS — E1165 Type 2 diabetes mellitus with hyperglycemia: Secondary | ICD-10-CM | POA: Diagnosis not present

## 2023-11-08 DIAGNOSIS — J309 Allergic rhinitis, unspecified: Secondary | ICD-10-CM | POA: Diagnosis not present

## 2023-11-08 DIAGNOSIS — F3181 Bipolar II disorder: Secondary | ICD-10-CM | POA: Diagnosis not present

## 2023-11-08 DIAGNOSIS — J449 Chronic obstructive pulmonary disease, unspecified: Secondary | ICD-10-CM | POA: Diagnosis not present

## 2023-11-08 DIAGNOSIS — G2401 Drug induced subacute dyskinesia: Secondary | ICD-10-CM | POA: Diagnosis not present

## 2023-11-08 DIAGNOSIS — R634 Abnormal weight loss: Secondary | ICD-10-CM | POA: Diagnosis not present

## 2023-11-12 DIAGNOSIS — R634 Abnormal weight loss: Secondary | ICD-10-CM | POA: Diagnosis not present

## 2023-11-12 DIAGNOSIS — G2401 Drug induced subacute dyskinesia: Secondary | ICD-10-CM | POA: Diagnosis not present

## 2023-11-12 DIAGNOSIS — J449 Chronic obstructive pulmonary disease, unspecified: Secondary | ICD-10-CM | POA: Diagnosis not present

## 2023-11-12 DIAGNOSIS — F3181 Bipolar II disorder: Secondary | ICD-10-CM | POA: Diagnosis not present

## 2023-11-12 DIAGNOSIS — J309 Allergic rhinitis, unspecified: Secondary | ICD-10-CM | POA: Diagnosis not present

## 2023-11-12 DIAGNOSIS — E1165 Type 2 diabetes mellitus with hyperglycemia: Secondary | ICD-10-CM | POA: Diagnosis not present

## 2023-11-13 DIAGNOSIS — I10 Essential (primary) hypertension: Secondary | ICD-10-CM | POA: Diagnosis not present

## 2023-11-13 DIAGNOSIS — M1380 Other specified arthritis, unspecified site: Secondary | ICD-10-CM | POA: Diagnosis not present

## 2023-11-13 DIAGNOSIS — E039 Hypothyroidism, unspecified: Secondary | ICD-10-CM | POA: Diagnosis not present

## 2023-11-13 DIAGNOSIS — R251 Tremor, unspecified: Secondary | ICD-10-CM | POA: Diagnosis not present

## 2023-11-13 DIAGNOSIS — R634 Abnormal weight loss: Secondary | ICD-10-CM | POA: Diagnosis not present

## 2023-11-13 DIAGNOSIS — E785 Hyperlipidemia, unspecified: Secondary | ICD-10-CM | POA: Diagnosis not present

## 2023-11-13 DIAGNOSIS — G2401 Drug induced subacute dyskinesia: Secondary | ICD-10-CM | POA: Diagnosis not present

## 2023-11-13 DIAGNOSIS — F3181 Bipolar II disorder: Secondary | ICD-10-CM | POA: Diagnosis not present

## 2023-11-13 DIAGNOSIS — R419 Unspecified symptoms and signs involving cognitive functions and awareness: Secondary | ICD-10-CM | POA: Diagnosis not present

## 2023-11-13 DIAGNOSIS — J449 Chronic obstructive pulmonary disease, unspecified: Secondary | ICD-10-CM | POA: Diagnosis not present

## 2023-11-13 DIAGNOSIS — E1165 Type 2 diabetes mellitus with hyperglycemia: Secondary | ICD-10-CM | POA: Diagnosis not present

## 2023-11-13 DIAGNOSIS — J309 Allergic rhinitis, unspecified: Secondary | ICD-10-CM | POA: Diagnosis not present

## 2023-11-14 DIAGNOSIS — F3181 Bipolar II disorder: Secondary | ICD-10-CM | POA: Diagnosis not present

## 2023-11-14 DIAGNOSIS — J449 Chronic obstructive pulmonary disease, unspecified: Secondary | ICD-10-CM | POA: Diagnosis not present

## 2023-11-14 DIAGNOSIS — J309 Allergic rhinitis, unspecified: Secondary | ICD-10-CM | POA: Diagnosis not present

## 2023-11-14 DIAGNOSIS — G2401 Drug induced subacute dyskinesia: Secondary | ICD-10-CM | POA: Diagnosis not present

## 2023-11-14 DIAGNOSIS — E1165 Type 2 diabetes mellitus with hyperglycemia: Secondary | ICD-10-CM | POA: Diagnosis not present

## 2023-11-14 DIAGNOSIS — R634 Abnormal weight loss: Secondary | ICD-10-CM | POA: Diagnosis not present

## 2023-11-15 DIAGNOSIS — J449 Chronic obstructive pulmonary disease, unspecified: Secondary | ICD-10-CM | POA: Diagnosis not present

## 2023-11-15 DIAGNOSIS — G2401 Drug induced subacute dyskinesia: Secondary | ICD-10-CM | POA: Diagnosis not present

## 2023-11-15 DIAGNOSIS — F3181 Bipolar II disorder: Secondary | ICD-10-CM | POA: Diagnosis not present

## 2023-11-15 DIAGNOSIS — J309 Allergic rhinitis, unspecified: Secondary | ICD-10-CM | POA: Diagnosis not present

## 2023-11-15 DIAGNOSIS — E1165 Type 2 diabetes mellitus with hyperglycemia: Secondary | ICD-10-CM | POA: Diagnosis not present

## 2023-11-15 DIAGNOSIS — R634 Abnormal weight loss: Secondary | ICD-10-CM | POA: Diagnosis not present

## 2023-11-16 DIAGNOSIS — J309 Allergic rhinitis, unspecified: Secondary | ICD-10-CM | POA: Diagnosis not present

## 2023-11-16 DIAGNOSIS — J449 Chronic obstructive pulmonary disease, unspecified: Secondary | ICD-10-CM | POA: Diagnosis not present

## 2023-11-16 DIAGNOSIS — G2401 Drug induced subacute dyskinesia: Secondary | ICD-10-CM | POA: Diagnosis not present

## 2023-11-16 DIAGNOSIS — F3181 Bipolar II disorder: Secondary | ICD-10-CM | POA: Diagnosis not present

## 2023-11-16 DIAGNOSIS — R634 Abnormal weight loss: Secondary | ICD-10-CM | POA: Diagnosis not present

## 2023-11-16 DIAGNOSIS — E1165 Type 2 diabetes mellitus with hyperglycemia: Secondary | ICD-10-CM | POA: Diagnosis not present

## 2023-11-17 DIAGNOSIS — J449 Chronic obstructive pulmonary disease, unspecified: Secondary | ICD-10-CM | POA: Diagnosis not present

## 2023-11-17 DIAGNOSIS — E1165 Type 2 diabetes mellitus with hyperglycemia: Secondary | ICD-10-CM | POA: Diagnosis not present

## 2023-11-17 DIAGNOSIS — G2401 Drug induced subacute dyskinesia: Secondary | ICD-10-CM | POA: Diagnosis not present

## 2023-11-17 DIAGNOSIS — F3181 Bipolar II disorder: Secondary | ICD-10-CM | POA: Diagnosis not present

## 2023-11-17 DIAGNOSIS — R634 Abnormal weight loss: Secondary | ICD-10-CM | POA: Diagnosis not present

## 2023-11-17 DIAGNOSIS — J309 Allergic rhinitis, unspecified: Secondary | ICD-10-CM | POA: Diagnosis not present

## 2023-11-18 DIAGNOSIS — G2401 Drug induced subacute dyskinesia: Secondary | ICD-10-CM | POA: Diagnosis not present

## 2023-11-18 DIAGNOSIS — R634 Abnormal weight loss: Secondary | ICD-10-CM | POA: Diagnosis not present

## 2023-11-18 DIAGNOSIS — J309 Allergic rhinitis, unspecified: Secondary | ICD-10-CM | POA: Diagnosis not present

## 2023-11-18 DIAGNOSIS — F3181 Bipolar II disorder: Secondary | ICD-10-CM | POA: Diagnosis not present

## 2023-11-18 DIAGNOSIS — J449 Chronic obstructive pulmonary disease, unspecified: Secondary | ICD-10-CM | POA: Diagnosis not present

## 2023-11-18 DIAGNOSIS — E1165 Type 2 diabetes mellitus with hyperglycemia: Secondary | ICD-10-CM | POA: Diagnosis not present

## 2023-11-19 ENCOUNTER — Ambulatory Visit: Payer: Medicare Other | Admitting: Psychiatry

## 2023-11-19 DIAGNOSIS — J309 Allergic rhinitis, unspecified: Secondary | ICD-10-CM | POA: Diagnosis not present

## 2023-11-19 DIAGNOSIS — E1165 Type 2 diabetes mellitus with hyperglycemia: Secondary | ICD-10-CM | POA: Diagnosis not present

## 2023-11-19 DIAGNOSIS — G2401 Drug induced subacute dyskinesia: Secondary | ICD-10-CM | POA: Diagnosis not present

## 2023-11-19 DIAGNOSIS — R634 Abnormal weight loss: Secondary | ICD-10-CM | POA: Diagnosis not present

## 2023-11-19 DIAGNOSIS — J449 Chronic obstructive pulmonary disease, unspecified: Secondary | ICD-10-CM | POA: Diagnosis not present

## 2023-11-19 DIAGNOSIS — F3181 Bipolar II disorder: Secondary | ICD-10-CM | POA: Diagnosis not present

## 2023-11-20 DIAGNOSIS — R634 Abnormal weight loss: Secondary | ICD-10-CM | POA: Diagnosis not present

## 2023-11-20 DIAGNOSIS — J309 Allergic rhinitis, unspecified: Secondary | ICD-10-CM | POA: Diagnosis not present

## 2023-11-20 DIAGNOSIS — G2401 Drug induced subacute dyskinesia: Secondary | ICD-10-CM | POA: Diagnosis not present

## 2023-11-20 DIAGNOSIS — J449 Chronic obstructive pulmonary disease, unspecified: Secondary | ICD-10-CM | POA: Diagnosis not present

## 2023-11-20 DIAGNOSIS — F3181 Bipolar II disorder: Secondary | ICD-10-CM | POA: Diagnosis not present

## 2023-11-20 DIAGNOSIS — E1165 Type 2 diabetes mellitus with hyperglycemia: Secondary | ICD-10-CM | POA: Diagnosis not present

## 2023-11-21 DIAGNOSIS — G2401 Drug induced subacute dyskinesia: Secondary | ICD-10-CM | POA: Diagnosis not present

## 2023-11-21 DIAGNOSIS — R634 Abnormal weight loss: Secondary | ICD-10-CM | POA: Diagnosis not present

## 2023-11-21 DIAGNOSIS — F3181 Bipolar II disorder: Secondary | ICD-10-CM | POA: Diagnosis not present

## 2023-11-21 DIAGNOSIS — J309 Allergic rhinitis, unspecified: Secondary | ICD-10-CM | POA: Diagnosis not present

## 2023-11-21 DIAGNOSIS — E1165 Type 2 diabetes mellitus with hyperglycemia: Secondary | ICD-10-CM | POA: Diagnosis not present

## 2023-11-21 DIAGNOSIS — J449 Chronic obstructive pulmonary disease, unspecified: Secondary | ICD-10-CM | POA: Diagnosis not present

## 2023-11-22 DIAGNOSIS — R634 Abnormal weight loss: Secondary | ICD-10-CM | POA: Diagnosis not present

## 2023-11-22 DIAGNOSIS — J309 Allergic rhinitis, unspecified: Secondary | ICD-10-CM | POA: Diagnosis not present

## 2023-11-22 DIAGNOSIS — E1165 Type 2 diabetes mellitus with hyperglycemia: Secondary | ICD-10-CM | POA: Diagnosis not present

## 2023-11-22 DIAGNOSIS — F3181 Bipolar II disorder: Secondary | ICD-10-CM | POA: Diagnosis not present

## 2023-11-22 DIAGNOSIS — J449 Chronic obstructive pulmonary disease, unspecified: Secondary | ICD-10-CM | POA: Diagnosis not present

## 2023-11-22 DIAGNOSIS — G2401 Drug induced subacute dyskinesia: Secondary | ICD-10-CM | POA: Diagnosis not present

## 2023-11-25 ENCOUNTER — Telehealth: Payer: Self-pay

## 2023-11-25 NOTE — Telephone Encounter (Signed)
 Fax received from [employer] stating patient was admitted to [facility] on 11/14/2023 and passed at [facility] on December 01, 2023.

## 2023-12-14 DEATH — deceased

## 2023-12-19 ENCOUNTER — Ambulatory Visit: Payer: Medicare Other | Admitting: Psychiatry
